# Patient Record
Sex: Female | Born: 1960 | Race: White | Hispanic: No | Marital: Married | Smoking: Never smoker
Health system: Southern US, Community
[De-identification: ages and names within clinical notes are randomized; demographics above are authoritative.]

## PROBLEM LIST (undated history)

## (undated) DIAGNOSIS — T4145XA Adverse effect of unspecified anesthetic, initial encounter: Secondary | ICD-10-CM

## (undated) DIAGNOSIS — G43909 Migraine, unspecified, not intractable, without status migrainosus: Secondary | ICD-10-CM

## (undated) DIAGNOSIS — R112 Nausea with vomiting, unspecified: Secondary | ICD-10-CM

## (undated) DIAGNOSIS — U071 COVID-19: Secondary | ICD-10-CM

## (undated) DIAGNOSIS — W19XXXA Unspecified fall, initial encounter: Secondary | ICD-10-CM

## (undated) DIAGNOSIS — K589 Irritable bowel syndrome without diarrhea: Secondary | ICD-10-CM

## (undated) DIAGNOSIS — T7840XA Allergy, unspecified, initial encounter: Secondary | ICD-10-CM

## (undated) DIAGNOSIS — F909 Attention-deficit hyperactivity disorder, unspecified type: Secondary | ICD-10-CM

## (undated) DIAGNOSIS — M8580 Other specified disorders of bone density and structure, unspecified site: Secondary | ICD-10-CM

## (undated) DIAGNOSIS — C4491 Basal cell carcinoma of skin, unspecified: Secondary | ICD-10-CM

## (undated) DIAGNOSIS — M1990 Unspecified osteoarthritis, unspecified site: Secondary | ICD-10-CM

## (undated) DIAGNOSIS — M67911 Unspecified disorder of synovium and tendon, right shoulder: Secondary | ICD-10-CM

## (undated) DIAGNOSIS — B019 Varicella without complication: Secondary | ICD-10-CM

## (undated) DIAGNOSIS — M109 Gout, unspecified: Secondary | ICD-10-CM

## (undated) DIAGNOSIS — R58 Hemorrhage, not elsewhere classified: Secondary | ICD-10-CM

## (undated) DIAGNOSIS — T8859XA Other complications of anesthesia, initial encounter: Secondary | ICD-10-CM

## (undated) DIAGNOSIS — I10 Essential (primary) hypertension: Secondary | ICD-10-CM

## (undated) DIAGNOSIS — K760 Fatty (change of) liver, not elsewhere classified: Secondary | ICD-10-CM

## (undated) DIAGNOSIS — G629 Polyneuropathy, unspecified: Secondary | ICD-10-CM

## (undated) DIAGNOSIS — Z9889 Other specified postprocedural states: Secondary | ICD-10-CM

## (undated) DIAGNOSIS — F32A Depression, unspecified: Secondary | ICD-10-CM

## (undated) DIAGNOSIS — K219 Gastro-esophageal reflux disease without esophagitis: Secondary | ICD-10-CM

## (undated) DIAGNOSIS — E119 Type 2 diabetes mellitus without complications: Secondary | ICD-10-CM

## (undated) DIAGNOSIS — G4700 Insomnia, unspecified: Secondary | ICD-10-CM

## (undated) DIAGNOSIS — E785 Hyperlipidemia, unspecified: Secondary | ICD-10-CM

## (undated) DIAGNOSIS — E669 Obesity, unspecified: Secondary | ICD-10-CM

## (undated) DIAGNOSIS — F329 Major depressive disorder, single episode, unspecified: Secondary | ICD-10-CM

## (undated) DIAGNOSIS — Z8619 Personal history of other infectious and parasitic diseases: Secondary | ICD-10-CM

## (undated) DIAGNOSIS — K529 Noninfective gastroenteritis and colitis, unspecified: Secondary | ICD-10-CM

## (undated) HISTORY — DX: Fatty (change of) liver, not elsewhere classified: K76.0

## (undated) HISTORY — DX: Gout, unspecified: M10.9

## (undated) HISTORY — PX: CHOLECYSTECTOMY: SHX55

## (undated) HISTORY — DX: Unspecified fall, initial encounter: W19.XXXA

## (undated) HISTORY — DX: Major depressive disorder, single episode, unspecified: F32.9

## (undated) HISTORY — PX: APPENDECTOMY: SHX54

## (undated) HISTORY — PX: MOHS SURGERY: SUR867

## (undated) HISTORY — DX: Insomnia, unspecified: G47.00

## (undated) HISTORY — DX: Type 2 diabetes mellitus without complications: E11.9

## (undated) HISTORY — DX: Gastro-esophageal reflux disease without esophagitis: K21.9

## (undated) HISTORY — DX: Personal history of other infectious and parasitic diseases: Z86.19

## (undated) HISTORY — DX: Varicella without complication: B01.9

## (undated) HISTORY — PX: OTHER SURGICAL HISTORY: SHX169

## (undated) HISTORY — DX: Allergy, unspecified, initial encounter: T78.40XA

## (undated) HISTORY — PX: FOOT SURGERY: SHX648

## (undated) HISTORY — DX: COVID-19: U07.1

## (undated) HISTORY — DX: Migraine, unspecified, not intractable, without status migrainosus: G43.909

## (undated) HISTORY — DX: Essential (primary) hypertension: I10

## (undated) HISTORY — DX: Unspecified disorder of synovium and tendon, right shoulder: M67.911

## (undated) HISTORY — DX: Obesity, unspecified: E66.9

## (undated) HISTORY — PX: TUBAL LIGATION: SHX77

## (undated) HISTORY — PX: ROTATOR CUFF REPAIR: SHX139

## (undated) HISTORY — PX: ABDOMINAL HYSTERECTOMY: SHX81

## (undated) HISTORY — DX: Unspecified osteoarthritis, unspecified site: M19.90

## (undated) HISTORY — DX: Noninfective gastroenteritis and colitis, unspecified: K52.9

## (undated) HISTORY — DX: Basal cell carcinoma of skin, unspecified: C44.91

## (undated) HISTORY — DX: Other specified disorders of bone density and structure, unspecified site: M85.80

## (undated) HISTORY — DX: Depression, unspecified: F32.A

---

## 1980-01-21 HISTORY — PX: OTHER SURGICAL HISTORY: SHX169

## 2004-11-06 ENCOUNTER — Ambulatory Visit: Payer: Self-pay | Admitting: Obstetrics and Gynecology

## 2004-11-14 ENCOUNTER — Ambulatory Visit: Payer: Self-pay | Admitting: Obstetrics and Gynecology

## 2004-12-09 ENCOUNTER — Inpatient Hospital Stay: Payer: Self-pay | Admitting: Obstetrics and Gynecology

## 2005-05-19 ENCOUNTER — Ambulatory Visit: Payer: Self-pay | Admitting: Obstetrics and Gynecology

## 2005-12-08 ENCOUNTER — Ambulatory Visit: Payer: Self-pay | Admitting: Obstetrics and Gynecology

## 2006-11-26 ENCOUNTER — Ambulatory Visit: Payer: Self-pay | Admitting: Family Medicine

## 2006-12-14 ENCOUNTER — Ambulatory Visit: Payer: Self-pay | Admitting: Obstetrics and Gynecology

## 2006-12-16 ENCOUNTER — Ambulatory Visit: Payer: Self-pay | Admitting: Obstetrics and Gynecology

## 2009-01-29 ENCOUNTER — Ambulatory Visit: Payer: Self-pay | Admitting: Unknown Physician Specialty

## 2009-03-19 ENCOUNTER — Ambulatory Visit: Payer: Self-pay | Admitting: Unknown Physician Specialty

## 2009-06-07 ENCOUNTER — Ambulatory Visit: Payer: Self-pay | Admitting: Obstetrics and Gynecology

## 2010-02-14 ENCOUNTER — Ambulatory Visit: Payer: Self-pay | Admitting: Neurology

## 2010-07-02 ENCOUNTER — Ambulatory Visit: Payer: Self-pay | Admitting: Obstetrics and Gynecology

## 2011-01-10 ENCOUNTER — Ambulatory Visit: Payer: Self-pay | Admitting: Internal Medicine

## 2011-03-05 ENCOUNTER — Ambulatory Visit: Payer: Self-pay | Admitting: Gastroenterology

## 2011-03-10 ENCOUNTER — Ambulatory Visit: Payer: Self-pay | Admitting: Surgery

## 2011-07-15 ENCOUNTER — Ambulatory Visit: Payer: Self-pay | Admitting: Gastroenterology

## 2011-07-21 ENCOUNTER — Ambulatory Visit: Payer: Self-pay | Admitting: Obstetrics and Gynecology

## 2012-07-26 ENCOUNTER — Ambulatory Visit: Payer: Self-pay | Admitting: Obstetrics and Gynecology

## 2012-08-30 DIAGNOSIS — C44319 Basal cell carcinoma of skin of other parts of face: Secondary | ICD-10-CM | POA: Insufficient documentation

## 2012-10-24 ENCOUNTER — Ambulatory Visit: Payer: Self-pay | Admitting: Internal Medicine

## 2012-10-24 LAB — URINALYSIS, COMPLETE
Glucose,UR: NEGATIVE mg/dL (ref 0–75)
Ketone: NEGATIVE
Leukocyte Esterase: NEGATIVE
Ph: 6.5 (ref 4.5–8.0)
Protein: NEGATIVE
Specific Gravity: 1.005 (ref 1.003–1.030)

## 2012-10-24 LAB — CBC WITH DIFFERENTIAL/PLATELET
Basophil #: 0 10*3/uL (ref 0.0–0.1)
Eosinophil %: 1.6 %
HCT: 43 % (ref 35.0–47.0)
HGB: 14.1 g/dL (ref 12.0–16.0)
Lymphocyte #: 2.1 10*3/uL (ref 1.0–3.6)
MCH: 29.4 pg (ref 26.0–34.0)
Monocyte #: 0.6 x10 3/mm (ref 0.2–0.9)
Monocyte %: 7.2 %
Neutrophil %: 66.9 %
Platelet: 305 10*3/uL (ref 150–440)
RDW: 13.4 % (ref 11.5–14.5)

## 2012-10-24 LAB — COMPREHENSIVE METABOLIC PANEL
BUN: 12 mg/dL (ref 7–18)
Bilirubin,Total: 0.2 mg/dL (ref 0.2–1.0)
Calcium, Total: 9 mg/dL (ref 8.5–10.1)
Co2: 29 mmol/L (ref 21–32)
Creatinine: 0.67 mg/dL (ref 0.60–1.30)
EGFR (Non-African Amer.): >60
Glucose: 103 mg/dL — ABNORMAL HIGH (ref 65–99)
Osmolality: 278 (ref 275–301)
SGPT (ALT): 22 U/L (ref 12–78)
Sodium: 139 mmol/L (ref 136–145)

## 2012-10-24 LAB — SEDIMENTATION RATE: Erythrocyte Sed Rate: 16 mm/hr (ref 0–30)

## 2013-01-07 ENCOUNTER — Ambulatory Visit: Payer: Self-pay | Admitting: Family Medicine

## 2013-01-09 ENCOUNTER — Ambulatory Visit: Payer: Self-pay | Admitting: Physician Assistant

## 2013-05-13 ENCOUNTER — Ambulatory Visit: Payer: Self-pay | Admitting: Family Medicine

## 2013-05-23 ENCOUNTER — Ambulatory Visit: Payer: Self-pay | Admitting: Orthopedic Surgery

## 2013-08-18 ENCOUNTER — Ambulatory Visit: Payer: Self-pay | Admitting: Physician Assistant

## 2013-08-18 LAB — RAPID STREP-A WITH REFLX: Micro Text Report: NEGATIVE

## 2013-08-21 LAB — BETA STREP CULTURE(ARMC)

## 2013-10-10 ENCOUNTER — Ambulatory Visit: Payer: Self-pay | Admitting: Obstetrics and Gynecology

## 2013-10-12 ENCOUNTER — Ambulatory Visit: Payer: Self-pay | Admitting: Obstetrics and Gynecology

## 2014-01-09 ENCOUNTER — Ambulatory Visit: Payer: Self-pay

## 2014-01-09 LAB — RAPID STREP-A WITH REFLX: Micro Text Report: NEGATIVE

## 2014-01-11 LAB — BETA STREP CULTURE(ARMC)

## 2014-01-17 ENCOUNTER — Ambulatory Visit: Payer: Self-pay | Admitting: Physician Assistant

## 2014-01-17 LAB — CBC WITH DIFFERENTIAL/PLATELET
BASOS ABS: 0.1 10*3/uL (ref 0.0–0.1)
BASOS PCT: 0.8 %
Eosinophil #: 0.2 10*3/uL (ref 0.0–0.7)
Eosinophil %: 1.6 %
HCT: 40.8 % (ref 35.0–47.0)
HGB: 13.3 g/dL (ref 12.0–16.0)
LYMPHS ABS: 3.2 10*3/uL (ref 1.0–3.6)
Lymphocyte %: 27.7 %
MCH: 28.6 pg (ref 26.0–34.0)
MCHC: 32.5 g/dL (ref 32.0–36.0)
MCV: 88 fL (ref 80–100)
MONO ABS: 0.6 x10 3/mm (ref 0.2–0.9)
MONOS PCT: 5.3 %
Neutrophil #: 7.4 10*3/uL — ABNORMAL HIGH (ref 1.4–6.5)
Neutrophil %: 64.6 %
Platelet: 324 10*3/uL (ref 150–440)
RBC: 4.64 10*6/uL (ref 3.80–5.20)
RDW: 13.7 % (ref 11.5–14.5)
WBC: 11.4 10*3/uL — AB (ref 3.6–11.0)

## 2014-01-17 LAB — MONONUCLEOSIS SCREEN: Mono Test: NEGATIVE

## 2014-05-14 NOTE — Op Note (Signed)
PATIENT NAME:  Kristin, [name] MR#:  [id number] DATE OF BIRTH:  August 16, 1960  DATE OF PROCEDURE:  03/10/2011  PREOPERATIVE DIAGNOSIS: Chronic acalculous cholecystitis.   POSTOPERATIVE DIAGNOSIS: Chronic acalculous cholecystitis.   PROCEDURE: Laparoscopic cholecystectomy, cholangiogram.   SURGEON: Loreli Dollar, MD  ANESTHESIA: General.   INDICATIONS: This 54 year old female reports a history of right upper quadrant pains over some two years duration. Recently has increased in severity and frequency. She had an abnormally low gallbladder ejection fraction of 15% and surgery was recommended for definitive treatment.   DESCRIPTION OF PROCEDURE: The patient was placed on the operating table in the supine position under general endotracheal anesthesia. The abdomen was prepared with ChloraPrep, draped in a sterile manner.   A short incision was made in the inferior aspect of the umbilicus and carried down to the deep fascia which was grasped with laryngeal hook and elevated. A Veress needle was inserted, aspirated, and irrigated with a saline solution. Next, the peritoneal cavity was inflated with carbon dioxide. The Veress needle was removed. The 10 mm cannula was inserted. The 10 mm 0 degrees laparoscope was inserted to view the peritoneal cavity. The patient was placed in the reverse Trendelenburg position and turned some 5 degrees to the left. Gallbladder appeared to have a slightly thickened wall and was retracted towards the right shoulder. The infundibulum was retracted inferiorly and laterally. The porta hepatis was demonstrated. The cystic duct was dissected free from surrounding structures. The cystic artery was dissected free from surrounding structures. The neck of the gallbladder was mobilized with incision of the visceral peritoneum. A critical view of safety was demonstrated. An endoclip was placed across the cystic duct adjacent to the neck of the gallbladder. An incision was made in  the cystic duct to introduce a Reddick catheter. Half-strength Conray-60 dye was injected as the cholangiogram was done with fluoroscopy demonstrating the biliary tree and prompt flow of dye into the duodenum. No retained stones were seen. The cholangiogram appeared normal. There was some mild obscurity related to the epigastric port. The cholangiocatheter was removed and the cystic duct was doubly ligated with endoclips and divided. The cystic artery was controlled with double endoclips and divided. The gallbladder was dissected free from the liver with hook and cautery. A small amount of blood was aspirated. Hemostasis was subsequently intact. The gallbladder was delivered up through the infraumbilical incision, opened and suctioned, removed and submitted for routine pathology. The right upper quadrant was further inspected. Hemostasis was intact. The cannulas were removed. Carbon dioxide was allowed to escape from the peritoneal cavity. Skin incisions were closed with interrupted 5-0 chromic subcuticular sutures, benzoin, and Steri-Strips. Dressings applied with paper tape. The patient tolerated surgery satisfactorily and was moved to the recovery room for postoperative care.  ____________________________ J. Rochel Brome, MD jws:cms D: 03/10/2011 12:38:46 ET T: 03/10/2011 13:05:30 ET JOB#: [id number]  cc: Loreli Dollar, MD, <Dictator> Loreli Dollar MD ELECTRONICALLY SIGNED 03/11/2011 9:43

## 2014-05-16 ENCOUNTER — Ambulatory Visit
Admit: 2014-05-16 | Disposition: A | Discharge status: Home / Self Care | Payer: Self-pay | Attending: Obstetrics and Gynecology | Admitting: Obstetrics and Gynecology

## 2014-06-04 ENCOUNTER — Ambulatory Visit
Admission: EM | Admit: 2014-06-04 | Discharge: 2014-06-04 | Disposition: A | Discharge status: Home / Self Care | Payer: Managed Care, Other (non HMO) | Attending: Family Medicine | Admitting: Family Medicine

## 2014-06-04 ENCOUNTER — Encounter: Payer: Self-pay | Admitting: Emergency Medicine

## 2014-06-04 DIAGNOSIS — R21 Rash and other nonspecific skin eruption: Secondary | ICD-10-CM

## 2014-06-04 HISTORY — DX: Hyperlipidemia, unspecified: E78.5

## 2014-06-04 MED ORDER — HYDROXYZINE HCL 25 MG PO TABS: 25.0000 mg | ORAL_TABLET | ORAL | Status: DC | PRN

## 2014-06-04 MED ORDER — PREDNISONE 10 MG PO TABS: ORAL_TABLET | ORAL | Status: DC

## 2014-06-04 NOTE — Discharge Instructions (Signed)
In addition to the prescriptions, you may also may take a daily stomach acid pill such as Zantac or Pepcid and a daily allergy medicine such as Zyrtec or Allegra, that will help with the itching as well.   Contact Dermatitis Contact dermatitis is a reaction to certain substances that touch the skin. Contact dermatitis can be either irritant contact dermatitis or allergic contact dermatitis. Irritant contact dermatitis does not require previous exposure to the substance for a reaction to occur.Allergic contact dermatitis only occurs if you have been exposed to the substance before. Upon a repeat exposure, your body reacts to the substance.  CAUSES  Many substances can cause contact dermatitis. Irritant dermatitis is most commonly caused by repeated exposure to mildly irritating substances, such as:  Makeup.  Soaps.  Detergents.  Bleaches.  Acids.  Metal salts, such as nickel. Allergic contact dermatitis is most commonly caused by exposure to:  Poisonous plants.  Chemicals (deodorants, shampoos).  Jewelry.  Latex.  Neomycin in triple antibiotic cream.  Preservatives in products, including clothing. SYMPTOMS  The area of skin that is exposed may develop:  Dryness or flaking.  Redness.  Cracks.  Itching.  Pain or a burning sensation.  Blisters. With allergic contact dermatitis, there may also be swelling in areas such as the eyelids, mouth, or genitals.  DIAGNOSIS  Your caregiver can usually tell what the problem is by doing a physical exam. In cases where the cause is uncertain and an allergic contact dermatitis is suspected, a patch skin test may be performed to help determine the cause of your dermatitis. TREATMENT Treatment includes protecting the skin from further contact with the irritating substance by avoiding that substance if possible. Barrier creams, powders, and gloves may be helpful. Your caregiver may also recommend:  Steroid creams or ointments applied  2 times daily. For best results, soak the rash area in cool water for 20 minutes. Then apply the medicine. Cover the area with a plastic wrap. You can store the steroid cream in the refrigerator for a "chilly" effect on your rash. That may decrease itching. Oral steroid medicines may be needed in more severe cases.  Antibiotics or antibacterial ointments if a skin infection is present.  Antihistamine lotion or an antihistamine taken by mouth to ease itching.  Lubricants to keep moisture in your skin.  Burow's solution to reduce redness and soreness or to dry a weeping rash. Mix one packet or tablet of solution in 2 cups cool water. Dip a clean washcloth in the mixture, wring it out a bit, and put it on the affected area. Leave the cloth in place for 30 minutes. Do this as often as possible throughout the day.  Taking several cornstarch or baking soda baths daily if the area is too large to cover with a washcloth. Harsh chemicals, such as alkalis or acids, can cause skin damage that is like a burn. You should flush your skin for 15 to 20 minutes with cold water after such an exposure. You should also seek immediate medical care after exposure. Bandages (dressings), antibiotics, and pain medicine may be needed for severely irritated skin.  HOME CARE INSTRUCTIONS  Avoid the substance that caused your reaction.  Keep the area of skin that is affected away from hot water, soap, sunlight, chemicals, acidic substances, or anything else that would irritate your skin.  Do not scratch the rash. Scratching may cause the rash to become infected.  You may take cool baths to help stop the itching.  Only  take over-the-counter or prescription medicines as directed by your caregiver.  See your caregiver for follow-up care as directed to make sure your skin is healing properly. SEEK MEDICAL CARE IF:   Your condition is not better after 3 days of treatment.  You seem to be getting worse.  You see signs  of infection such as swelling, tenderness, redness, soreness, or warmth in the affected area.  You have any problems related to your medicines. Document Released: 01/04/2000 Document Revised: 03/31/2011 Document Reviewed: 06/11/2010 [facility] Patient Information 2015 [place], [place]. This information is not intended to replace advice given to you by your health care provider. Make sure you discuss any questions you have with your health care provider.  Allergies Allergies may happen from anything your body is sensitive to. This may be food, medicines, pollens, chemicals, and nearly anything around you in everyday life that produces allergens. An allergen is anything that causes an allergy producing substance. Heredity is often a factor in causing these problems. This means you may have some of the same allergies as your parents. Food allergies happen in all age groups. Food allergies are some of the most severe and life threatening. Some common food allergies are cow's milk, seafood, eggs, nuts, wheat, and soybeans. SYMPTOMS   Swelling around the mouth.  An itchy red rash or hives.  Vomiting or diarrhea.  Difficulty breathing. SEVERE ALLERGIC REACTIONS ARE LIFE-THREATENING. This reaction is called anaphylaxis. It can cause the mouth and throat to swell and cause difficulty with breathing and swallowing. In severe reactions only a trace amount of food (for example, peanut oil in a salad) may cause death within seconds. Seasonal allergies occur in all age groups. These are seasonal because they usually occur during the same season every year. They may be a reaction to molds, grass pollens, or tree pollens. Other causes of problems are house dust mite allergens, pet dander, and mold spores. The symptoms often consist of nasal congestion, a runny itchy nose associated with sneezing, and tearing itchy eyes. There is often an associated itching of the mouth and ears. The problems happen when you come in  contact with pollens and other allergens. Allergens are the particles in the air that the body reacts to with an allergic reaction. This causes you to release allergic antibodies. Through a chain of events, these eventually cause you to release histamine into the blood stream. Although it is meant to be protective to the body, it is this release that causes your discomfort. This is why you were given anti-histamines to feel better. If you are unable to pinpoint the offending allergen, it may be determined by skin or blood testing. Allergies cannot be cured but can be controlled with medicine. Hay fever is a collection of all or some of the seasonal allergy problems. It may often be treated with simple over-the-counter medicine such as diphenhydramine. Take medicine as directed. Do not drink alcohol or drive while taking this medicine. Check with your caregiver or package insert for child dosages. If these medicines are not effective, there are many new medicines your caregiver can prescribe. Stronger medicine such as nasal spray, eye drops, and corticosteroids may be used if the first things you try do not work well. Other treatments such as immunotherapy or desensitizing injections can be used if all else fails. Follow up with your caregiver if problems continue. These seasonal allergies are usually not life threatening. They are generally more of a nuisance that can often be handled using medicine.  HOME CARE INSTRUCTIONS   If unsure what causes a reaction, keep a diary of foods eaten and symptoms that follow. Avoid foods that cause reactions.  If hives or rash are present:  Take medicine as directed.  You may use an over-the-counter antihistamine (diphenhydramine) for hives and itching as needed.  Apply cold compresses (cloths) to the skin or take baths in cool water. Avoid hot baths or showers. Heat will make a rash and itching worse.  If you are severely allergic:  Following a treatment for a  severe reaction, hospitalization is often required for closer follow-up.  Wear a medic-alert bracelet or necklace stating the allergy.  You and your family must learn how to give adrenaline or use an anaphylaxis kit.  If you have had a severe reaction, always carry your anaphylaxis kit or EpiPen with you. Use this medicine as directed by your caregiver if a severe reaction is occurring. Failure to do so could have a fatal outcome. SEEK MEDICAL CARE IF:  You suspect a food allergy. Symptoms generally happen within 30 minutes of eating a food.  Your symptoms have not gone away within 2 days or are getting worse.  You develop new symptoms.  You want to retest yourself or your child with a food or drink you think causes an allergic reaction. Never do this if an anaphylactic reaction to that food or drink has happened before. Only do this under the care of a caregiver. SEEK IMMEDIATE MEDICAL CARE IF:   You have difficulty breathing, are wheezing, or have a tight feeling in your chest or throat.  You have a swollen mouth, or you have hives, swelling, or itching all over your body.  You have had a severe reaction that has responded to your anaphylaxis kit or an EpiPen. These reactions may return when the medicine has worn off. These reactions should be considered life threatening. MAKE SURE YOU:   Understand these instructions.  Will watch your condition.  Will get help right away if you are not doing well or get worse. Document Released: 04/01/2002 Document Revised: 05/03/2012 Document Reviewed: 09/06/2007 [facility] Patient Information 2015 [place], [place]. This information is not intended to replace advice given to you by your health care provider. Make sure you discuss any questions you have with your health care provider.  Rash A rash is a change in the color or texture of your skin. There are many different types of rashes. You may have other problems that accompany your  rash. CAUSES   Infections.  Allergic reactions. This can include allergies to pets or foods.  Certain medicines.  Exposure to certain chemicals, soaps, or cosmetics.  Heat.  Exposure to poisonous plants.  Tumors, both cancerous and noncancerous. SYMPTOMS   Redness.  Scaly skin.  Itchy skin.  Dry or cracked skin.  Bumps.  Blisters.  Pain. DIAGNOSIS  Your caregiver may do a physical exam to determine what type of rash you have. A skin sample (biopsy) may be taken and examined under a microscope. TREATMENT  Treatment depends on the type of rash you have. Your caregiver may prescribe certain medicines. For serious conditions, you may need to see a skin doctor (dermatologist). HOME CARE INSTRUCTIONS   Avoid the substance that caused your rash.  Do not scratch your rash. This can cause infection.  You may take cool baths to help stop itching.  Only take over-the-counter or prescription medicines as directed by your caregiver.  Keep all follow-up appointments as directed by your caregiver.  SEEK IMMEDIATE MEDICAL CARE IF:  You have increasing pain, swelling, or redness.  You have a fever.  You have new or severe symptoms.  You have body aches, diarrhea, or vomiting.  Your rash is not better after 3 days. MAKE SURE YOU:  Understand these instructions.  Will watch your condition.  Will get help right away if you are not doing well or get worse. Document Released: 12/27/2001 Document Revised: 03/31/2011 Document Reviewed: 10/21/2010 [facility] Patient Information 2015 [place], [place]. This information is not intended to replace advice given to you by your health care provider. Make sure you discuss any questions you have with your health care provider.

## 2014-06-04 NOTE — ED Provider Notes (Signed)
CSN: [id number]     Arrival date & time 06/04/14  0932 History   First MD Initiated Contact with Patient 06/04/14 1057     Chief Complaint  Patient presents with  . Rash  . Pruritis   (Consider location/radiation/quality/duration/timing/severity/associated sxs/prior Treatment) HPI        54 year old female presents complaining of an itching rash. This started on her neck and chest yesterday and has since spread to her back and off for several days. It is red and extremely itchy. She denies any known exposure to allergens. She has never had a rash like this that she can recall. She denies any current or recent illness. She denies sore throat, cough, congestion, rhinorrhea, or any other cold symptoms. No one else in her home or work has a similar rash that she knows of.  Past Medical History  Diagnosis Date  . Hyperlipidemia    Past Surgical History  Procedure Laterality Date  . Cesarean section N/A     2  . Cholecystectomy    . Abdominal hysterectomy    . Rotator cuff repair      lt shoulder   No family history on file. History  Substance Use Topics  . Smoking status: Never Smoker   . Smokeless tobacco: Not on file  . Alcohol Use: No   OB History    No data available     Review of Systems  Skin: Positive for rash.  All other systems reviewed and are negative.   Allergies  Tetracyclines & related  Home Medications   Prior to Admission medications   Medication Sig Start Date End Date Taking? Authorizing Provider  diphenhydrAMINE (SOMINEX) 25 MG tablet Take 50 mg by mouth at bedtime as needed for itching.   Yes Historical Provider, MD  estrogens, conjugated, (PREMARIN) 1.25 MG tablet Take 1.25 mg by mouth daily.   Yes Historical Provider, MD  simvastatin (ZOCOR) 10 MG tablet Take 10 mg by mouth daily.   Yes Historical Provider, MD  traZODone (DESYREL) 100 MG tablet Take 100 mg by mouth at bedtime.   Yes Historical Provider, MD  hydrOXYzine (ATARAX/VISTARIL) 25 MG tablet  Take 1 tablet (25 mg total) by mouth every 4 (four) hours as needed. 06/04/14   Liam Graham, PA-C  predniSONE (DELTASONE) 10 MG tablet 4 tabs PO QD for 4 days; 3 tabs PO QD for 3 days; 2 tabs PO QD for 2 days; 1 tab PO QD for 1 day 06/04/14   Liam Graham, PA-C   BP 151/73 mmHg  Pulse 72  Temp(Src) 97.1 F (36.2 C) (Oral)  Resp 18  Ht 5\' 3"  (1.6 m)  Wt 191 lb (86.637 kg)  BMI 33.84 kg/m2  SpO2 99% Physical Exam  Constitutional: She is oriented to person, place, and time. Vital signs are normal. She appears well-developed and well-nourished. No distress.  HENT:  Head: Normocephalic and atraumatic.  Right Ear: External ear normal.  Left Ear: External ear normal.  Nose: Nose normal.  Mouth/Throat: Oropharynx is clear and moist. No oropharyngeal exudate.  Eyes: Conjunctivae are normal.  Neck: Normal range of motion. Neck supple.  Cardiovascular: Normal rate, regular rhythm and normal heart sounds.   Pulmonary/Chest: Effort normal and breath sounds normal. No respiratory distress.  Neurological: She is alert and oriented to person, place, and time. She has normal strength. Coordination normal.  Skin: Skin is warm and dry. Rash noted. Rash is maculopapular (erythematous maculopapular rash on the trunk and extremities, nontender). She is  not diaphoretic.  Psychiatric: She has a normal mood and affect. Judgment normal.  Nursing note and vitals reviewed.   ED Course  Procedures (including critical care time) Labs Review Labs Reviewed - No data to display  Imaging Review No results found.   MDM   1. Rash and nonspecific skin eruption    Unclear cause, will treat with prednisone and antihistamines. Follow-up if no improvement in a couple days  New Prescriptions   HYDROXYZINE (ATARAX/VISTARIL) 25 MG TABLET    Take 1 tablet (25 mg total) by mouth every 4 (four) hours as needed.   PREDNISONE (DELTASONE) 10 MG TABLET    4 tabs PO QD for 4 days; 3 tabs PO QD for 3 days; 2 tabs PO  QD for 2 days; 1 tab PO QD for 1 day       Liam Graham, PA-C 06/04/14 1115

## 2014-06-04 NOTE — ED Notes (Signed)
Pt present s with c/o rash and itching since yesterday, states that she started with a rash on the neck and lt arm and it started itching since yesterday, tried the OTC benadryl with no relief, denies any food allergies or working outside or any contact with plants

## 2014-06-21 ENCOUNTER — Ambulatory Visit
Admission: EM | Admit: 2014-06-21 | Discharge: 2014-06-21 | Disposition: A | Discharge status: Home / Self Care | Payer: Managed Care, Other (non HMO) | Attending: Family Medicine | Admitting: Family Medicine

## 2014-06-21 DIAGNOSIS — J029 Acute pharyngitis, unspecified: Secondary | ICD-10-CM

## 2014-06-21 NOTE — ED Notes (Signed)
Patient states she has had a sore throat for several weeks.

## 2014-06-21 NOTE — ED Provider Notes (Signed)
CSN: [id number]     Arrival date & time 06/21/14  1501 History   First MD Initiated Contact with Patient 06/21/14 1534     Chief Complaint  Patient presents with  . Sore Throat   (Consider location/radiation/quality/duration/timing/severity/associated sxs/prior Treatment) HPI   54 year old female who presents with a sore throat of 3 weeks duration. She states that is seen to be getting worse and not better. Denies any fever or chills. Her husband states that she has a raspy voice and her coworkers complain that she clears her throat frequently. She denies any cough or any sinus problems. She's had no recent airplane flights or been up in the mountains. She states that it hurts to swallow or singing but she feels certain that it is not a strep throat. She has never smoked.  Past Medical History  Diagnosis Date  . Hyperlipidemia    Past Surgical History  Procedure Laterality Date  . Cesarean section N/A     2  . Cholecystectomy    . Abdominal hysterectomy    . Rotator cuff repair      lt shoulder   No family history on file. History  Substance Use Topics  . Smoking status: Never Smoker   . Smokeless tobacco: Not on file  . Alcohol Use: No   OB History    No data available     Review of Systems  HENT: Positive for sore throat, trouble swallowing and voice change. Negative for postnasal drip, rhinorrhea, sinus pressure, sneezing and tinnitus.   Musculoskeletal: Negative for myalgias and arthralgias.  All other systems reviewed and are negative.   Allergies  Tetracyclines & related  Home Medications   Prior to Admission medications   Medication Sig Start Date End Date Taking? Authorizing Provider  estrogens, conjugated, (PREMARIN) 1.25 MG tablet Take 1.25 mg by mouth daily.   Yes Historical Provider, MD  simvastatin (ZOCOR) 10 MG tablet Take 10 mg by mouth daily.   Yes Historical Provider, MD  traZODone (DESYREL) 100 MG tablet Take 100 mg by mouth at bedtime.   Yes  Historical Provider, MD  diphenhydrAMINE (SOMINEX) 25 MG tablet Take 50 mg by mouth at bedtime as needed for itching.    Historical Provider, MD  hydrOXYzine (ATARAX/VISTARIL) 25 MG tablet Take 1 tablet (25 mg total) by mouth every 4 (four) hours as needed. 06/04/14   Liam Graham, PA-C  predniSONE (DELTASONE) 10 MG tablet 4 tabs PO QD for 4 days; 3 tabs PO QD for 3 days; 2 tabs PO QD for 2 days; 1 tab PO QD for 1 day 06/04/14   Liam Graham, PA-C   BP 129/65 mmHg  Pulse 77  Temp(Src) 98 F (36.7 C) (Tympanic)  Resp 16  Ht 5\' 3"  (1.6 m)  Wt 188 lb (85.276 kg)  BMI 33.31 kg/m2  SpO2 97% Physical Exam  Constitutional: She is oriented to person, place, and time. She appears well-developed and well-nourished.  HENT:  Head: Normocephalic and atraumatic.  Right Ear: External ear normal.  Left Ear: External ear normal.  Nose: Nose normal.  Mouth/Throat: Oropharynx is clear and moist.  Eyes: EOM are normal. Pupils are equal, round, and reactive to light. Right eye exhibits no discharge. Left eye exhibits no discharge.  Neck: Normal range of motion. No thyromegaly present.  Cardiovascular: Normal rate, regular rhythm and normal heart sounds.  Exam reveals no gallop and no friction rub.   No murmur heard. Pulmonary/Chest: Effort normal and breath sounds normal. No  respiratory distress. She has no wheezes. She has no rales. She exhibits no tenderness.  Musculoskeletal: Normal range of motion.  Lymphadenopathy:    She has no cervical adenopathy.  Neurological: She is alert and oriented to person, place, and time. She has normal reflexes.  Skin: Skin is warm and dry.  Psychiatric: She has a normal mood and affect. Judgment and thought content normal.  Nursing note and vitals reviewed.   ED Course  Procedures (including critical care time) Labs Review Labs Reviewed - No data to display  Imaging Review No results found.   MDM   1. Sore throat    I spoken to the patient regarding  her findings and her symptoms. I have not found a significant findings to indicate a bacterial infection in her throat or viral reason for her throat pain. However with her complaints of a raspy voice as well as clearing her throat frequently and the length of time that she has been experiencing a sore throat without any fever or chills I recommend that she be seen by an ear nose and throat specialist and have recommended Dr. Ladene Artist for a more thorough workup and possible video lyringoscopy. In the meantime I have instructed her in the use of nasal steroids until she ranges an appointment with Dr. Ladene Artist.Lorin Picket, PA-C 06/21/14 [phone]

## 2014-06-22 ENCOUNTER — Encounter: Payer: Self-pay | Admitting: Emergency Medicine

## 2014-07-05 ENCOUNTER — Encounter: Payer: Self-pay | Admitting: Obstetrics and Gynecology

## 2014-07-05 ENCOUNTER — Ambulatory Visit (INDEPENDENT_AMBULATORY_CARE_PROVIDER_SITE_OTHER): Payer: Managed Care, Other (non HMO) | Admitting: Obstetrics and Gynecology

## 2014-07-05 VITALS — BP 164/90 | HR 76 | Ht 63.0 in | Wt 194.1 lb

## 2014-07-05 DIAGNOSIS — R638 Other symptoms and signs concerning food and fluid intake: Secondary | ICD-10-CM

## 2014-07-05 DIAGNOSIS — Z780 Asymptomatic menopausal state: Secondary | ICD-10-CM | POA: Diagnosis not present

## 2014-07-05 DIAGNOSIS — T50905A Adverse effect of unspecified drugs, medicaments and biological substances, initial encounter: Secondary | ICD-10-CM | POA: Insufficient documentation

## 2014-07-05 DIAGNOSIS — R030 Elevated blood-pressure reading, without diagnosis of hypertension: Secondary | ICD-10-CM | POA: Diagnosis not present

## 2014-07-05 DIAGNOSIS — I158 Other secondary hypertension: Secondary | ICD-10-CM | POA: Insufficient documentation

## 2014-07-05 NOTE — Progress Notes (Signed)
Patient ID: Kristin [name], female   DOB: [birth year], 54 y.o.   MRN: [id number]    GYN ENCOUNTER NOTE  Subjective:       Kristin [name] is a 54 y.o. No obstetric history on file. female is here for gynecologic evaluation of the following issues:  1. Weight gain.  RN note:  Pt presents for wt gain. 14# wt gain since last year. Pos exercise and diet control. Can't lose the weight.  Kristin [name] has been off again on again with phentermine/diet/exercise management for weight control.  She has waxed and waned with her weight loss program.  Most recently over the past year she has gained 14 pounds and is interested in proceeding with a different approach.  Clearly the appetite suppressant medication along with diet and exercise have not been successful.  She is exercising routinely, getting 10-15,000 steps per day.  She reports eating healthy, 3 meals per day.  Review of her blood pressure trends shows an increase in her blood pressure which may be associated with her weight gain this past year.  She understands that this issue needs to be addressed and she is referred to her primary care physician for evaluation and management of hypertension.  She will also be referred to the [facility] health bariatric clinic for possible alternative interventions for weight loss.    Gynecologic History No LMP recorded. Patient has had a hysterectomy. Contraception: post menopausal status  Obstetric History OB History  No data available    Past Medical History  Diagnosis Date  . Hyperlipidemia   . Obesity   . Osteopenia   . Basal cell carcinoma     face, back, neck  . Migraine   . History of shingles     Past Surgical History  Procedure Laterality Date  . Cesarean section N/A     2  . Cholecystectomy    . Abdominal hysterectomy    . Rotator cuff repair      lt shoulder  . Basal cell removed    . Tubal ligation    . Cyst removed from wrist    . Wedge resection of ovary  1982     Current Outpatient Prescriptions on File Prior to Visit  Medication Sig Dispense Refill  . simvastatin (ZOCOR) 10 MG tablet Take 10 mg by mouth daily.    . traZODone (DESYREL) 100 MG tablet Take 100 mg by mouth at bedtime.     No current facility-administered medications on file prior to visit.    Allergies  Allergen Reactions  . Tetracyclines & Related Swelling and Rash    History   Social History  . Marital Status: Married    Spouse Name: N/A  . Number of Children: N/A  . Years of Education: N/A   Occupational History  . Not on file.   Social History Main Topics  . Smoking status: Never Smoker   . Smokeless tobacco: Not on file  . Alcohol Use: No  . Drug Use: No  . Sexual Activity: Yes   Other Topics Concern  . Not on file   Social History Narrative    Family History  Problem Relation Age of Onset  . Cancer Mother 93    breast  . Heart disease Mother   . Heart disease Father   . Diabetes Sister   . Diabetes Maternal Aunt   . Diabetes Maternal Uncle   . Diabetes Maternal Grandfather   . Colon cancer Neg Hx   . Ovarian cancer  Neg Hx     The following portions of the patient's history were reviewed and updated as appropriate: allergies, current medications, past family history, past medical history, past social history, past surgical history and problem list.  Review of Systems  Review of Systems - General ROS: negative for - chills, fatigue, fever, hot flashes, malaise or night sweats Hematological and Lymphatic ROS: negative for - bleeding problems or swollen lymph nodes Gastrointestinal ROS: negative for - abdominal pain, blood in stools, change in bowel habits and nausea/vomiting Musculoskeletal ROS: negative for - joint pain, muscle pain or muscular weakness Genito-Urinary ROS: negative for - dysuria, genital discharge, genital ulcers, hematuria, incontinence, irregular/heavy menses, nocturia or pelvic pain  Objective:   BP 164/90 mmHg  Pulse  76  Ht 5\' 3"  (1.6 m)  Wt 194 lb 1.6 oz (88.043 kg)  BMI 34.39 kg/m2 Exam deferred today.     Assessment:   1.  Increased BMI with recent weight gain, 14 pounds; thyroid testing was normal during ENT evaluation for reflux.Appetite suppressant with exercise and dietary modification strategies have proven ineffective over the past several years. 2.  Elevated blood pressure; family history of heart disease; patient needs evaluation and possible medical intervention.    Plan:  1.  Referral to Dr. Brigitte Pulse, primary care for hypertension evaluation. 2.  Referral to [facility] health bariatric clinic for consultation regarding weight loss management strategies with consideration towards possible surgical intervention.  A total of 15 minutes were spent face-to-face with the patient during this encounter and over half of that time dealt with counseling and coordination of care.

## 2014-07-05 NOTE — Patient Instructions (Signed)
Referral to Dr. Brigitte Pulse for Hypertension evaluation and management. Referral to [facility] Bariat[facility] Surgery for consultation.

## 2014-07-10 ENCOUNTER — Ambulatory Visit: Payer: Managed Care, Other (non HMO)

## 2014-07-10 ENCOUNTER — Ambulatory Visit
Admission: EM | Admit: 2014-07-10 | Discharge: 2014-07-10 | Disposition: A | Discharge status: Home / Self Care | Payer: Managed Care, Other (non HMO) | Attending: Family Medicine | Admitting: Family Medicine

## 2014-07-10 DIAGNOSIS — Z85828 Personal history of other malignant neoplasm of skin: Secondary | ICD-10-CM | POA: Insufficient documentation

## 2014-07-10 DIAGNOSIS — E785 Hyperlipidemia, unspecified: Secondary | ICD-10-CM | POA: Insufficient documentation

## 2014-07-10 DIAGNOSIS — R21 Rash and other nonspecific skin eruption: Secondary | ICD-10-CM | POA: Insufficient documentation

## 2014-07-10 DIAGNOSIS — M79674 Pain in right toe(s): Secondary | ICD-10-CM | POA: Diagnosis present

## 2014-07-10 DIAGNOSIS — E669 Obesity, unspecified: Secondary | ICD-10-CM | POA: Insufficient documentation

## 2014-07-10 MED ORDER — HYDROCODONE-ACETAMINOPHEN 5-325 MG PO TABS: 1.0000 | ORAL_TABLET | Freq: Four times a day (QID) | ORAL | Status: DC | PRN

## 2014-07-10 MED ORDER — PREDNISONE 20 MG PO TABS: 20.0000 mg | ORAL_TABLET | Freq: Every day | ORAL | Status: DC

## 2014-07-10 NOTE — ED Notes (Signed)
Seen here 2 weeks ago and Dx with rash. Given Prednisone and rash subsided. Yesterday, rash returned upper anterior chest, arm, posterior legs and posterior shoulder. Also started last Wednesday evening with right great toe pain with swelling. States very painful to bear weight

## 2014-07-10 NOTE — ED Provider Notes (Signed)
CSN: [id number]     Arrival date & time 07/10/14  1448 History   First MD Initiated Contact with Patient 07/10/14 1531     Chief Complaint  Patient presents with  . Rash  . Toe Pain   (Consider location/radiation/quality/duration/timing/severity/associated sxs/prior Treatment) HPI Comments: 54 yo female with a 2 weeks h/o intermittent itchy rash on anterior upper chest, forearms, upper back area. Was seen about 2 weeks ago, diagnosed with contact dermatitis but states she stopped the new soap she had been using. Rash has recurred. Itchiness is same day and night. Patient also complains of 5 days of right big toe pain, which started suddenly without any injuries. Denies fevers, chills.   Patient is a 54 y.o. female presenting with rash and toe pain.  Rash Toe Pain    Past Medical History  Diagnosis Date  . Hyperlipidemia   . Obesity   . Osteopenia   . Basal cell carcinoma     face, back, neck  . Migraine   . History of shingles    Past Surgical History  Procedure Laterality Date  . Cesarean section N/A     2  . Cholecystectomy    . Abdominal hysterectomy    . Rotator cuff repair      lt shoulder  . Basal cell removed    . Tubal ligation    . Cyst removed from wrist    . Wedge resection of ovary  1982   Family History  Problem Relation Age of Onset  . Cancer Mother 25    breast  . Heart disease Mother   . Heart disease Father   . Cancer Father   . Diabetes Sister   . Diabetes Maternal Aunt   . Diabetes Maternal Uncle   . Diabetes Maternal Grandfather   . Colon cancer Neg Hx   . Ovarian cancer Neg Hx    History  Substance Use Topics  . Smoking status: Never Smoker   . Smokeless tobacco: Not on file  . Alcohol Use: No   OB History    No data available     Review of Systems  Skin: Positive for rash.    Allergies  Tetracyclines & related  Home Medications   Prior to Admission medications   Medication Sig Start Date End Date Taking? Authorizing  Provider  estradiol (ESTRACE) 1 MG tablet Take 1 mg by mouth daily. 04/12/14  Yes Historical Provider, MD  hydrOXYzine (ATARAX/VISTARIL) 25 MG tablet Take 25 mg by mouth 3 (three) times daily as needed.   Yes Historical Provider, MD  omeprazole (PRILOSEC) 40 MG capsule TAKE ONE PILL ONCE DAILY, 30 MINUTES PRIOR TO EVENING MEAL. 06/30/14  Yes Historical Provider, MD  simvastatin (ZOCOR) 10 MG tablet Take 10 mg by mouth daily.   Yes Historical Provider, MD  traZODone (DESYREL) 100 MG tablet Take 100 mg by mouth at bedtime.   Yes Historical Provider, MD  HYDROcodone-acetaminophen (NORCO/VICODIN) 5-325 MG per tablet Take 1-2 tablets by mouth every 6 (six) hours as needed. 07/10/14   Norval Gable, MD  predniSONE (DELTASONE) 20 MG tablet Take 1 tablet (20 mg total) by mouth daily. 07/10/14   Norval Gable, MD   BP 158/74 mmHg  Pulse 86  Temp(Src) 98.5 F (36.9 C) (Oral)  Resp 18  Ht 5\' 3"  (1.6 m)  Wt 192 lb (87.091 kg)  BMI 34.02 kg/m2  SpO2 99% Physical Exam  Constitutional: She appears well-developed and well-nourished. No distress.  Musculoskeletal: She exhibits tenderness. She  exhibits no edema.       Right foot: There is bony tenderness.       Feet:  Mild edema and erythema to right 1st MTP joint with tenderness to palpation; skin intact; no lesion or rashes  Skin: Rash noted. Rash is papular. She is not diaphoretic.     Pinpoint papular rash to sun exposed areas of upper chest, forearms, and upper back area  Nursing note and vitals reviewed.   ED Course  Procedures (including critical care time) Labs Review Labs Reviewed - No data to display  Imaging Review Dg Toe Great Right  07/10/2014   CLINICAL DATA:  Right great toe pain for 6 days.  No known injury.  EXAM: RIGHT GREAT TOE  COMPARISON:  None.  FINDINGS: There is no evidence of fracture or dislocation. There is no evidence of arthropathy or other focal bone abnormality. Soft tissues are unremarkable.  IMPRESSION: Normal exam.    Electronically Signed   By: Lorriane Shire M.D.   On: 07/10/2014 15:28     MDM   1. Pain of toe of right foot   2. Rash and nonspecific skin eruption   (pain at right 1st MTP; likely gout)  Discharge Medication List as of 07/10/2014  3:51 PM    START taking these medications   Details  HYDROcodone-acetaminophen (NORCO/VICODIN) 5-325 MG per tablet Take 1-2 tablets by mouth every 6 (six) hours as needed., Starting 07/10/2014, Until Discontinued, Print    predniSONE (DELTASONE) 20 MG tablet Take 1 tablet (20 mg total) by mouth daily., Starting 07/10/2014, Until Discontinued, Normal      Plan: 1. x-ray results (negative/normal) and diagnosis reviewed with patient 2. rx as per orders; risks, benefits, potential side effects reviewed with patient 3. Recommend supportive treatment with otc tylenol, rest, increased fluids, otc cortisone cream, otc antihistamine prn 4. F/u prn if symptoms worsen or don't improve    Norval Gable, MD 07/10/14 1706

## 2014-07-17 ENCOUNTER — Encounter: Payer: Self-pay | Admitting: Family Medicine

## 2014-07-17 ENCOUNTER — Ambulatory Visit (INDEPENDENT_AMBULATORY_CARE_PROVIDER_SITE_OTHER): Payer: Managed Care, Other (non HMO) | Admitting: Family Medicine

## 2014-07-17 VITALS — BP 144/78 | HR 94 | Temp 97.9°F | Ht 63.0 in | Wt 193.9 lb

## 2014-07-17 DIAGNOSIS — G4700 Insomnia, unspecified: Secondary | ICD-10-CM

## 2014-07-17 DIAGNOSIS — I10 Essential (primary) hypertension: Secondary | ICD-10-CM | POA: Insufficient documentation

## 2014-07-17 DIAGNOSIS — R011 Cardiac murmur, unspecified: Secondary | ICD-10-CM

## 2014-07-17 DIAGNOSIS — R010 Benign and innocent cardiac murmurs: Secondary | ICD-10-CM

## 2014-07-17 HISTORY — DX: Essential (primary) hypertension: I10

## 2014-07-17 MED ORDER — TRAZODONE HCL 100 MG PO TABS
100.0000 mg | ORAL_TABLET | Freq: Every day | ORAL | Status: DC
Start: 2014-07-17 — End: 2014-12-07

## 2014-07-17 MED ORDER — METOPROLOL TARTRATE 25 MG PO TABS: 25.0000 mg | ORAL_TABLET | Freq: Two times a day (BID) | ORAL | Status: DC

## 2014-07-17 NOTE — Progress Notes (Signed)
Name: Kristin [name]   MRN: [id number]    DOB: 07/19/1960   Date:07/17/2014       Progress Note  Subjective  Chief Complaint  Chief Complaint  Patient presents with  . Establish Care    Dr Marguerite Olea patient, concerned about B/P    HPI Insomnia: this patient presents with follow-up for insomnia. Symptoms of insomnia are as described as repeated nighttime awakenings, frequent tossing and turning in bed, and being unable to "shut her brain off". She is taking Trazodone 100mg  qhs. This helps with falling asleep but not so much with saying asleep. She was on 150mg  earlier which made her drowsy the next day and was dropped down to 100 mg. Elevated BP: Patient reports that her blood pressure stays elevated usually around 140s-150s over 70s-80s.she has never been diagnosed with hypertension and has never been on any antihypertensive medications. Last week, her blood pressure was elevated at the gynecologist's office at 164/90.she denies any chest pain, shortness of breath, headaches, visual disturbance, or other concerning symptoms  Past Medical History  Diagnosis Date  . Hyperlipidemia   . Obesity   . Osteopenia   . Migraine   . History of shingles   . Basal cell carcinoma     face, back, neck    Past Surgical History  Procedure Laterality Date  . Cholecystectomy    . Rotator cuff repair      lt shoulder  . Basal cell removed    . Tubal ligation    . Cyst removed from wrist    . Wedge resection of ovary  1982  . Abdominal hysterectomy    . Cesarean section N/A     2    Family History  Problem Relation Age of Onset  . Cancer Mother 57    breast  . Heart disease Mother   . Heart disease Father   . Cancer Father   . Diabetes Sister   . Diabetes Maternal Aunt   . Diabetes Maternal Uncle   . Diabetes Maternal Grandfather   . Colon cancer Neg Hx   . Ovarian cancer Neg Hx     History   Social History  . Marital Status: Married    Spouse Name: N/A  . Number of  Children: N/A  . Years of Education: N/A   Occupational History  . Not on file.   Social History Main Topics  . Smoking status: Never Smoker   . Smokeless tobacco: Not on file  . Alcohol Use: No  . Drug Use: No  . Sexual Activity: Yes   Other Topics Concern  . Not on file   Social History Narrative     Current outpatient prescriptions:  .  estradiol (ESTRACE) 1 MG tablet, Take 1 mg by mouth daily., Disp: , Rfl: 1 .  omeprazole (PRILOSEC) 40 MG capsule, TAKE ONE PILL ONCE DAILY, 30 MINUTES PRIOR TO EVENING MEAL., Disp: , Rfl: 12 .  simvastatin (ZOCOR) 10 MG tablet, Take 10 mg by mouth daily., Disp: , Rfl:  .  traZODone (DESYREL) 100 MG tablet, Take 100 mg by mouth at bedtime., Disp: , Rfl:  .  ascorbic acid (VITAMIN C) 500 MG tablet, Take 1 tablet by mouth daily., Disp: , Rfl:  .  HYDROcodone-acetaminophen (NORCO/VICODIN) 5-325 MG per tablet, Take 1-2 tablets by mouth every 6 (six) hours as needed. (Patient not taking: Reported on 07/17/2014), Disp: 10 tablet, Rfl: 0 .  hydrOXYzine (ATARAX/VISTARIL) 25 MG tablet, Take 25 mg by mouth 3 (  three) times daily as needed., Disp: , Rfl:  .  MULTIPLE VITAMIN PO, Take 1 tablet by mouth daily., Disp: , Rfl:  .  Omega-3 Krill Oil 500 MG CAPS, Take 1 capsule by mouth daily., Disp: , Rfl:  .  predniSONE (DELTASONE) 20 MG tablet, Take 1 tablet (20 mg total) by mouth daily. (Patient not taking: Reported on 07/17/2014), Disp: 7 tablet, Rfl: 0  Allergies  Allergen Reactions  . Tetracyclines & Related Swelling and Rash     Review of Systems  Constitutional: Negative for fever and chills.  Respiratory: Negative for cough and shortness of breath.   Cardiovascular: Negative for chest pain, palpitations, orthopnea and leg swelling.  Neurological: Positive for headaches.  Psychiatric/Behavioral: Positive for depression. Negative for substance abuse. The patient has insomnia. The patient is not nervous/anxious.       Objective  Filed Vitals:    07/17/14 1402  BP: 144/78  Pulse: 94  Temp: 97.9 F (36.6 C)  TempSrc: Oral  Height: 5\' 3"  (1.6 m)  Weight: 193 lb 14.4 oz (87.952 kg)  SpO2: 97%    Physical Exam  Constitutional: She is oriented to person, place, and time and well-developed, well-nourished, and in no distress.  HENT:  Head: Normocephalic and atraumatic.  Mouth/Throat: Oropharynx is clear and moist.  Eyes: Conjunctivae are normal. Pupils are equal, round, and reactive to light.  Cardiovascular: Normal rate and regular rhythm.   Murmur heard. Pulmonary/Chest: Effort normal and breath sounds normal.  Neurological: She is alert and oriented to person, place, and time.  Skin: Skin is warm and dry.  Psychiatric: Memory, affect and judgment normal.  Nursing note and vitals reviewed.     Assessment & Plan 1. Essential hypertension Patient will be started on low-dose beta blocker for treatment of hypertension. In addition, this should help prevent her migraine headaches. Patient will follow-up in one month for blood pressure assessment. - metoprolol tartrate (LOPRESSOR) 25 MG tablet; Take 1 tablet (25 mg total) by mouth 2 (two) times daily.  Dispense: 180 tablet; Refill: 0  2. Cannot sleep Symptoms stable on therapy. Continue current medication. - traZODone (DESYREL) 100 MG tablet; Take 1 tablet (100 mg total) by mouth at bedtime.  Dispense: 90 tablet; Refill: 0  3. Heart murmur previously undiagnosed  - Ambulatory referral to Cardiology  There are no diagnoses linked to this encounter.  Samoria Fedorko Asad A[facility] 07/17/2014 2:27 PM

## 2014-07-20 ENCOUNTER — Other Ambulatory Visit: Payer: Self-pay | Admitting: Specialist

## 2014-07-21 NOTE — Addendum Note (Signed)
Addended by: Elouise Munroe on: 07/21/2014 11:23 AM   Modules accepted: Orders

## 2014-07-25 ENCOUNTER — Ambulatory Visit
Admission: RE | Admit: 2014-07-25 | Discharge: 2014-07-25 | Disposition: A | Discharge status: Home / Self Care | Payer: 59 | Source: Ambulatory Visit | Attending: Specialist | Admitting: Specialist

## 2014-07-25 DIAGNOSIS — I10 Essential (primary) hypertension: Secondary | ICD-10-CM | POA: Insufficient documentation

## 2014-08-01 ENCOUNTER — Encounter: Payer: Self-pay | Admitting: Obstetrics and Gynecology

## 2014-08-01 ENCOUNTER — Ambulatory Visit (INDEPENDENT_AMBULATORY_CARE_PROVIDER_SITE_OTHER): Payer: Managed Care, Other (non HMO) | Admitting: Obstetrics and Gynecology

## 2014-08-01 VITALS — BP 156/76 | HR 86 | Ht 62.0 in | Wt 195.6 lb

## 2014-08-01 DIAGNOSIS — Z01419 Encounter for gynecological examination (general) (routine) without abnormal findings: Secondary | ICD-10-CM

## 2014-08-01 DIAGNOSIS — Z1231 Encounter for screening mammogram for malignant neoplasm of breast: Secondary | ICD-10-CM

## 2014-08-01 DIAGNOSIS — E119 Type 2 diabetes mellitus without complications: Secondary | ICD-10-CM | POA: Diagnosis not present

## 2014-08-01 DIAGNOSIS — E1169 Type 2 diabetes mellitus with other specified complication: Secondary | ICD-10-CM

## 2014-08-01 DIAGNOSIS — E669 Obesity, unspecified: Secondary | ICD-10-CM | POA: Diagnosis not present

## 2014-08-01 DIAGNOSIS — Z0000 Encounter for general adult medical examination without abnormal findings: Secondary | ICD-10-CM | POA: Diagnosis not present

## 2014-08-01 DIAGNOSIS — Z780 Asymptomatic menopausal state: Secondary | ICD-10-CM

## 2014-08-01 DIAGNOSIS — R638 Other symptoms and signs concerning food and fluid intake: Secondary | ICD-10-CM

## 2014-08-01 DIAGNOSIS — N952 Postmenopausal atrophic vaginitis: Secondary | ICD-10-CM

## 2014-08-01 NOTE — Progress Notes (Signed)
Patient ID: CERENITY GOSHORN, female   DOB: February 19, 1960, 54 y.o.   MRN: [id number] ANNUAL PREVENTATIVE CARE GYN  ENCOUNTER NOTE  Subjective:       Mikaela Hilgeman Lamore is a 54 y.o. No obstetric history on file. female here for a routine annual gynecologic exam.  Current complaints: 1.  none    Gynecologic History No LMP recorded. Patient has had a hysterectomy. Contraception: status post hysterectomy Last Pap: no further paps. Results were: normal Last mammogram: 4/ 2016 left breast f/u 09/2014. Results were: normal  Obstetric History OB History  No data available    Past Medical History  Diagnosis Date  . Hyperlipidemia   . Obesity   . Osteopenia   . Migraine   . History of shingles   . Basal cell carcinoma     face, back, neck  . Hypertension   . Gout     Past Surgical History  Procedure Laterality Date  . Cholecystectomy    . Rotator cuff repair      lt shoulder  . Basal cell removed    . Tubal ligation    . Cyst removed from wrist    . Wedge resection of ovary  1982  . Abdominal hysterectomy    . Cesarean section N/A     2    Current Outpatient Prescriptions on File Prior to Visit  Medication Sig Dispense Refill  . ascorbic acid (VITAMIN C) 500 MG tablet Take 1 tablet by mouth daily.    Marland Kitchen estradiol (ESTRACE) 1 MG tablet Take 1 mg by mouth daily.  1  . metoprolol tartrate (LOPRESSOR) 25 MG tablet Take 1 tablet (25 mg total) by mouth 2 (two) times daily. 180 tablet 0  . MULTIPLE VITAMIN PO Take 1 tablet by mouth daily.    . Omega-3 Krill Oil 500 MG CAPS Take 1 capsule by mouth daily.    Marland Kitchen omeprazole (PRILOSEC) 40 MG capsule TAKE ONE PILL ONCE DAILY, 30 MINUTES PRIOR TO EVENING MEAL.  12  . simvastatin (ZOCOR) 10 MG tablet Take 10 mg by mouth daily.    . traZODone (DESYREL) 100 MG tablet Take 1 tablet (100 mg total) by mouth at bedtime. 90 tablet 0   No current facility-administered medications on file prior to visit.    Allergies  Allergen Reactions  .  Tetracyclines & Related Swelling and Rash    History   Social History  . Marital Status: Married    Spouse Name: N/A  . Number of Children: N/A  . Years of Education: N/A   Occupational History  . Not on file.   Social History Main Topics  . Smoking status: Never Smoker   . Smokeless tobacco: Not on file  . Alcohol Use: No  . Drug Use: No  . Sexual Activity: Yes   Other Topics Concern  . Not on file   Social History Narrative    Family History  Problem Relation Age of Onset  . Cancer Mother 47    breast  . Heart disease Mother   . Heart disease Father   . Cancer Father   . Diabetes Sister   . Diabetes Maternal Aunt   . Diabetes Maternal Uncle   . Diabetes Maternal Grandfather   . Colon cancer Neg Hx   . Ovarian cancer Neg Hx     The following portions of the patient's history were reviewed and updated as appropriate: allergies, current medications, past family history, past medical history, past social history, past  surgical history and problem list.  Review of Systems ROS Review of Systems - General ROS: negative for - chills, fatigue, fever, hot flashes, night sweats, weight gain or weight loss Psychological ROS: negative for - anxiety, decreased libido, depression, mood swings, physical abuse or sexual abuse Ophthalmic ROS: negative for - blurry vision, eye pain or loss of vision ENT ROS: negative for - headaches, hearing change, visual changes or vocal changes Allergy and Immunology ROS: negative for - hives, itchy/watery eyes or seasonal allergies Hematological and Lymphatic ROS: negative for - bleeding problems, bruising, swollen lymph nodes or weight loss Endocrine ROS: negative for - galactorrhea, hair pattern changes, hot flashes, malaise/lethargy, mood swings, palpitations, polydipsia/polyuria, skin changes, temperature intolerance or unexpected weight changes Breast ROS: negative for - new or changing breast lumps or nipple discharge Respiratory ROS:  negative for - cough or shortness of breath Cardiovascular ROS: negative for - chest pain, irregular heartbeat, palpitations or shortness of breath Gastrointestinal ROS: no abdominal pain, change in bowel habits, or black or bloody stools Genito-Urinary ROS: no dysuria, trouble voiding, or hematuria Musculoskeletal ROS: negative for - joint pain or joint stiffness Neurological ROS: negative for - bowel and bladder control changes Dermatological ROS: negative for rash and skin lesion changes   Objective:   BP 156/76 mmHg  Pulse 86  Ht 5\' 2"  (1.575 m)  Wt 195 lb 9.6 oz (88.724 kg)  BMI 35.77 kg/m2 CONSTITUTIONAL: Well-developed, well-nourished female in no acute distress.  PSYCHIATRIC: Normal mood and affect. Normal behavior. Normal judgment and thought content. [place]: Alert and oriented to person, place, and time. Normal muscle tone coordination. No cranial nerve deficit noted. HENT:  Normocephalic, atraumatic, External right and left ear normal. Oropharynx is clear and moist EYES: Conjunctivae and EOM are normal. Pupils are equal, round, and reactive to light. No scleral icterus.  NECK: Normal range of motion, supple, no masses.  Normal thyroid.  SKIN: Skin is warm and dry. No rash noted. Not diaphoretic. No erythema. No pallor. CARDIOVASCULAR: Normal heart rate noted, regular rhythm, no murmur. RESPIRATORY: Clear to auscultation bilaterally. Effort and breath sounds normal, no problems with respiration noted. BREASTS: Symmetric in size. No masses, skin changes, nipple drainage, or lymphadenopathy. ABDOMEN: Soft, normal bowel sounds, no distention noted.  No tenderness, rebound or guarding.  BLADDER: Normal PELVIC:  External Genitalia: Normal  BUS: Normal  Vagina: Normal  Cervix: Normal  Uterus: Normal  Adnexa: Normal  RV: External Exam NormaI, No Rectal Masses and Normal Sphincter tone  MUSCULOSKELETAL: Normal range of motion. No tenderness.  No cyanosis, clubbing, or edema.   2+ distal pulses. LYMPHATIC: No Axillary, Supraclavicular, or Inguinal Adenopathy.    Assessment:   Annual gynecologic examination 54 y.o. Contraception: status post hysterectomy Obesity 1 Problem List Items Addressed This Visit    None      Plan:  Pap: Not needed Mammogram: Ordered Stool Guaiac Testing:  Ordered Labs:  Completed by Dr. Darnell Level  Routine preventative health maintenance measures emphasized: Exercise/Diet/Weight control and Alcohol/Substance use risks Letter of referral for bariatric surgery is written. Return to [place], [place]   Brayton Mars, MD

## 2014-08-01 NOTE — Patient Instructions (Addendum)
1.  Referral letter for bariatric surgery intervention is to be completed for patient. 2.  Mammogram is due in September 2016. 3.  Stool guaiac cards are given for colon cancer screening. 4.  Continue with diet and exercise in an attempt to optimize weight loss. 5.  Patient is to see Dr. Derek Jack for evaluation of blood pressure and possible evaluation and treatment for adult-onset diabetes (hemoglobin A1c is 6.5)  LETTER of referral for bariatric surgery: To Whom It May Concern: Kristin [name] is a patient of mine who has worked extensively on conservative measures for weight loss including diet, exercise, and phentermine interventions.  She has had difficulty in achieving ongoing success with weight loss.  She is very much interested in proceeding with bariatric surgery referral.  Review of lab work demonstrates probable type 2 diabetes mellitus in a patient with obesity diagnosis.  She is being referred to her internist for further management of this condition along with her hypertension. I am now recommending that she be approved for the bariatric surgical program as she is deemed an excellent candidate. She you have any questions, please feel free to contact me. Brayton Mars, M.D., Cherlynn June

## 2014-08-08 ENCOUNTER — Telehealth: Payer: Self-pay | Admitting: Obstetrics and Gynecology

## 2014-08-08 NOTE — Telephone Encounter (Signed)
Patient called in regards to a letter she requested at her last visit from Dr D. The Letter was to state that he recommended her for bariatric surgery.

## 2014-08-09 ENCOUNTER — Ambulatory Visit (INDEPENDENT_AMBULATORY_CARE_PROVIDER_SITE_OTHER): Payer: Managed Care, Other (non HMO) | Admitting: Family Medicine

## 2014-08-09 ENCOUNTER — Encounter: Payer: Self-pay | Admitting: Family Medicine

## 2014-08-09 VITALS — BP 147/78 | HR 94 | Temp 98.0°F | Resp 19 | Ht 62.0 in | Wt 198.0 lb

## 2014-08-09 DIAGNOSIS — E119 Type 2 diabetes mellitus without complications: Secondary | ICD-10-CM | POA: Diagnosis not present

## 2014-08-09 DIAGNOSIS — L309 Dermatitis, unspecified: Secondary | ICD-10-CM | POA: Insufficient documentation

## 2014-08-09 DIAGNOSIS — I10 Essential (primary) hypertension: Secondary | ICD-10-CM

## 2014-08-09 MED ORDER — LOSARTAN POTASSIUM 50 MG PO TABS: 50.0000 mg | ORAL_TABLET | Freq: Every day | ORAL | Status: DC

## 2014-08-09 MED ORDER — TRIAMCINOLONE ACETONIDE 0.5 % EX OINT: 1.0000 "application " | TOPICAL_OINTMENT | Freq: Two times a day (BID) | CUTANEOUS | Status: DC

## 2014-08-09 NOTE — Telephone Encounter (Signed)
Pt aware letter is under her pt education. Encouraged to use MYchart. Printed encounter note for pt to p/u at her request.

## 2014-08-09 NOTE — Progress Notes (Signed)
Name: Kristin [name]   MRN: [id number]    DOB: [birth year]   Date:08/09/2014       Progress Note  Subjective  Chief Complaint  Chief Complaint  Patient presents with  . Follow-up    1 mo. /f/u to check how medicaitons from last visit are working for BP/Insomnia  . Hypertension    Hypertension This is a new problem. The problem is controlled. Associated symptoms include shortness of breath (noticed worsening shortness of breath recently.). Pertinent negatives include no chest pain, headaches or palpitations. Past treatments include beta blockers. The current treatment provides moderate improvement. Compliance problems include medication side effects (pt. thinks Metoprolol gave her diarrhea and she has cut down to 1 tablet daily but the diarrhea still has not resolved.).  There is no history of angina, kidney disease, CAD/MI or CVA.  Rash This is a new problem. The current episode started in the past 7 days. The affected locations include the left lower leg. The rash is characterized by itchiness and dryness. She was exposed to nothing. Associated symptoms include shortness of breath (noticed worsening shortness of breath recently.). Past treatments include topical steroids. The treatment provided mild relief.      Past Medical History  Diagnosis Date  . Hyperlipidemia   . Obesity   . Osteopenia   . Migraine   . History of shingles   . Basal cell carcinoma     face, back, neck  . Hypertension   . Gout     Past Surgical History  Procedure Laterality Date  . Cholecystectomy    . Rotator cuff repair      lt shoulder  . Basal cell removed    . Tubal ligation    . Cyst removed from wrist    . Wedge resection of ovary  1982  . Abdominal hysterectomy    . Cesarean section N/A     2    Family History  Problem Relation Age of Onset  . Cancer Mother 55    breast  . Heart disease Mother   . Heart disease Father   . Cancer Father   . Diabetes Sister   . Diabetes Maternal  Aunt   . Diabetes Maternal Uncle   . Diabetes Maternal Grandfather   . Colon cancer Neg Hx   . Ovarian cancer Neg Hx     History   Social History  . Marital Status: Married    Spouse Name: N/A  . Number of Children: N/A  . Years of Education: N/A   Occupational History  . Not on file.   Social History Main Topics  . Smoking status: Never Smoker   . Smokeless tobacco: Not on file  . Alcohol Use: No  . Drug Use: No  . Sexual Activity: Yes   Other Topics Concern  . Not on file   Social History Narrative     Current outpatient prescriptions:  .  ascorbic acid (VITAMIN C) 500 MG tablet, Take 1 tablet by mouth daily., Disp: , Rfl:  .  estradiol (ESTRACE) 1 MG tablet, Take 1 mg by mouth daily., Disp: , Rfl: 1 .  metoprolol tartrate (LOPRESSOR) 25 MG tablet, Take 1 tablet (25 mg total) by mouth 2 (two) times daily., Disp: 180 tablet, Rfl: 0 .  MULTIPLE VITAMIN PO, Take 1 tablet by mouth daily., Disp: , Rfl:  .  Omega-3 Krill Oil 500 MG CAPS, Take 1 capsule by mouth daily., Disp: , Rfl:  .  omeprazole (PRILOSEC) 40  MG capsule, TAKE ONE PILL ONCE DAILY, 30 MINUTES PRIOR TO EVENING MEAL., Disp: , Rfl: 12 .  simvastatin (ZOCOR) 10 MG tablet, Take 10 mg by mouth daily., Disp: , Rfl:  .  traZODone (DESYREL) 100 MG tablet, Take 1 tablet (100 mg total) by mouth at bedtime., Disp: 90 tablet, Rfl: 0  Allergies  Allergen Reactions  . Tetracyclines & Related Swelling and Rash     Review of Systems  Respiratory: Positive for shortness of breath (noticed worsening shortness of breath recently.).   Cardiovascular: Negative for chest pain and palpitations.  Skin: Positive for rash.  Neurological: Negative for headaches.      Objective  Filed Vitals:   08/09/14 1429  BP: 147/78  Pulse: 94  Temp: 98 F (36.7 C)  TempSrc: Oral  Resp: 19  Height: 5\' 2"  (1.575 m)  Weight: 198 lb (89.812 kg)  SpO2: 98%    Physical Exam  Constitutional: She is oriented to person, place, and  time and well-developed, well-nourished, and in no distress.  Cardiovascular: Normal rate and regular rhythm.   Pulmonary/Chest: Effort normal and breath sounds normal.  Neurological: She is alert and oriented to person, place, and time.  Skin: Skin is warm and dry. Rash (papulo-pustular pruritic rash over the back of left leg.) noted.  Psychiatric: Mood, memory, affect and judgment normal.  Nursing note and vitals reviewed.    Assessment & Plan 1. Essential hypertension  - losartan (COZAAR) 50 MG tablet; Take 1 tablet (50 mg total) by mouth daily.  Dispense: 90 tablet; Refill: 3  2. Dermatitis of lower extremity  - triamcinolone ointment (KENALOG) 0.5 %; Apply 1 application topically 2 (two) times daily.  Dispense: 30 g; Refill: 0  3. Diabetes mellitus type 2, controlled Follow up with review of labs.  Braiden Rodman [facility] 08/09/2014 3:02 PM

## 2014-08-14 DIAGNOSIS — I10 Essential (primary) hypertension: Secondary | ICD-10-CM

## 2014-08-14 DIAGNOSIS — R002 Palpitations: Secondary | ICD-10-CM | POA: Insufficient documentation

## 2014-08-14 HISTORY — DX: Essential (primary) hypertension: I10

## 2014-08-14 HISTORY — DX: Palpitations: R00.2

## 2014-08-30 ENCOUNTER — Telehealth: Payer: Self-pay | Admitting: Obstetrics and Gynecology

## 2014-08-30 MED ORDER — FLUCONAZOLE 150 MG PO TABS: 150.0000 mg | ORAL_TABLET | Freq: Once | ORAL | Status: DC

## 2014-08-30 NOTE — Telephone Encounter (Signed)
Left message that medication will be sent to pharmacy. (Diflucan 150mg . 1 tab #1; and 1 refill). If any problems with rx to contact office.  Pt returned call and aware rx for Diflucan sent to pharmacy.

## 2014-08-30 NOTE — Telephone Encounter (Signed)
PT CALLED AND SHE STATED SHE HAS ANOTHER YEAST INFECTION AND SHE WANTED TO KNOW IF A RX COULD BE CALLED IN FOR HER.

## 2014-08-31 ENCOUNTER — Telehealth: Payer: Self-pay

## 2014-08-31 ENCOUNTER — Encounter: Payer: Self-pay | Admitting: Cardiovascular Disease

## 2014-08-31 ENCOUNTER — Encounter: Payer: Self-pay | Admitting: Family Medicine

## 2014-08-31 NOTE — Telephone Encounter (Signed)
Non-Urgent Medical Question  Message [phone]   From  Elvira Langston Wisz   To  Wellington Hampshire, MD   Sent  08/31/2014 3:18 PM     Thank you but I have seen another DR. and will not need your services.  Arie Watson    Appt cancelled on schedule 8/11 3:45pm

## 2014-09-06 ENCOUNTER — Ambulatory Visit: Payer: Managed Care, Other (non HMO) | Admitting: Family Medicine

## 2014-09-08 ENCOUNTER — Ambulatory Visit: Payer: 59 | Admitting: Cardiovascular Disease

## 2014-09-29 DIAGNOSIS — K219 Gastro-esophageal reflux disease without esophagitis: Secondary | ICD-10-CM | POA: Insufficient documentation

## 2014-09-29 DIAGNOSIS — K449 Diaphragmatic hernia without obstruction or gangrene: Secondary | ICD-10-CM | POA: Insufficient documentation

## 2014-10-04 ENCOUNTER — Encounter: Payer: Self-pay | Admitting: Specialist

## 2014-10-10 DIAGNOSIS — G4733 Obstructive sleep apnea (adult) (pediatric): Secondary | ICD-10-CM | POA: Insufficient documentation

## 2014-10-11 ENCOUNTER — Other Ambulatory Visit: Payer: Self-pay | Admitting: Obstetrics and Gynecology

## 2014-12-07 ENCOUNTER — Encounter: Payer: Self-pay | Admitting: Family Medicine

## 2014-12-07 ENCOUNTER — Ambulatory Visit (INDEPENDENT_AMBULATORY_CARE_PROVIDER_SITE_OTHER): Payer: Managed Care, Other (non HMO) | Admitting: Family Medicine

## 2014-12-07 VITALS — BP 132/75 | HR 80 | Temp 98.5°F | Resp 17 | Ht 62.0 in | Wt 173.1 lb

## 2014-12-07 DIAGNOSIS — G4700 Insomnia, unspecified: Secondary | ICD-10-CM

## 2014-12-07 DIAGNOSIS — M25521 Pain in right elbow: Secondary | ICD-10-CM | POA: Diagnosis not present

## 2014-12-07 MED ORDER — ESZOPICLONE 1 MG PO TABS: 1.0000 mg | ORAL_TABLET | Freq: Every evening | ORAL | Status: DC | PRN

## 2014-12-07 NOTE — Progress Notes (Signed)
Name: Kristin [name]   MRN: [id number]    DOB: July 27, 1960   Date:12/07/2014       Progress Note  Subjective  Chief Complaint  Chief Complaint  Patient presents with  . Insomnia  . Elbow Injury    Rt elbow pain  . Medication Problem    Trazodone patient states she unable to keep down   Insomnia Primary symptoms: fragmented sleep, difficulty falling asleep, frequent awakening.  The symptoms are relieved by medication (Trazodone 100mg  which seemed to work well for many years, now she has N/V with Trazodone.). The treatment provided moderate relief.   Pain In Elbow Pt. With complaints of intermittent right elbow pain, worse with extension. No trauma. Started with gradual onset tenderness and then pain on movement. Applying heat and ice has helped.  Past Medical History  Diagnosis Date  . Hyperlipidemia   . Obesity   . Osteopenia   . Migraine   . History of shingles   . Basal cell carcinoma     face, back, neck  . Hypertension   . Gout     Past Surgical History  Procedure Laterality Date  . Cholecystectomy    . Rotator cuff repair      lt shoulder  . Basal cell removed    . Tubal ligation    . Cyst removed from wrist    . Wedge resection of ovary  1982  . Abdominal hysterectomy    . Cesarean section N/A     2    Family History  Problem Relation Age of Onset  . Cancer Mother 23    breast  . Heart disease Mother   . Heart disease Father   . Cancer Father   . Diabetes Sister   . Diabetes Maternal Aunt   . Diabetes Maternal Uncle   . Diabetes Maternal Grandfather   . Colon cancer Neg Hx   . Ovarian cancer Neg Hx     Social History   Social History  . Marital Status: Married    Spouse Name: N/A  . Number of Children: N/A  . Years of Education: N/A   Occupational History  . Not on file.   Social History Main Topics  . Smoking status: Never Smoker   . Smokeless tobacco: Not on file  . Alcohol Use: No  . Drug Use: No  . Sexual Activity: Yes    Other Topics Concern  . Not on file   Social History Narrative     Current outpatient prescriptions:  .  traZODone (DESYREL) 100 MG tablet, Take 1 tablet (100 mg total) by mouth at bedtime. (Patient not taking: Reported on 12/07/2014), Disp: 90 tablet, Rfl: 0  Allergies  Allergen Reactions  . Tetracyclines & Related Swelling and Rash    FACE SWELL, RASH ON CHEST  . Metoprolol Diarrhea  . Alcohol Rash    ABDOMINAL CRAMPS   Review of Systems  Constitutional: Negative for fever and chills.  Musculoskeletal: Positive for joint pain.  Psychiatric/Behavioral: The patient has insomnia.     Objective  Filed Vitals:   12/07/14 1558  BP: 132/75  Pulse: 80  Temp: 98.5 F (36.9 C)  TempSrc: Oral  Resp: 17  Height: 5\' 2"  (1.575 m)  Weight: 173 lb 1.6 oz (78.518 kg)  SpO2: 97%    Physical Exam  Constitutional: She is oriented to person, place, and time and well-developed, well-nourished, and in no distress.  Cardiovascular: Normal rate, regular rhythm and normal heart sounds.  Pulmonary/Chest: Effort normal and breath sounds normal. She has no wheezes.  Abdominal: Soft. Bowel sounds are normal. There is no tenderness.  Musculoskeletal:       Right elbow: She exhibits swelling. She exhibits no effusion and no deformity. Tenderness found. Medial epicondyle tenderness noted.       Arms: Neurological: She is alert and oriented to person, place, and time.  Psychiatric: Memory, affect and judgment normal.  Nursing note and vitals reviewed.   Assessment & Plan  1. Cannot sleep DC trazodone and will start on Lunesta 1 mg at bedtime. Educated on the dependence potential and possible adverse effects. Follow-up in one month. - eszopiclone (LUNESTA) 1 MG TABS tablet; Take 1 tablet (1 mg total) by mouth at bedtime as needed for sleep. Take immediately before bedtime  Dispense: 30 tablet; Refill: 0  2. Pain in right elbow Unclear etiology. We'll obtain x-rays of right elbow and  follow-up. - DG Elbow Complete Right; Future   [name] 12/07/2014 4:46 PM

## 2014-12-11 ENCOUNTER — Ambulatory Visit
Admission: RE | Admit: 2014-12-11 | Discharge: 2014-12-11 | Disposition: A | Discharge status: Home / Self Care | Payer: Managed Care, Other (non HMO) | Source: Ambulatory Visit | Attending: Family Medicine | Admitting: Family Medicine

## 2014-12-11 DIAGNOSIS — M25521 Pain in right elbow: Secondary | ICD-10-CM | POA: Diagnosis present

## 2014-12-11 DIAGNOSIS — W19XXXA Unspecified fall, initial encounter: Secondary | ICD-10-CM | POA: Diagnosis not present

## 2014-12-11 DIAGNOSIS — S52124A Nondisplaced fracture of head of right radius, initial encounter for closed fracture: Secondary | ICD-10-CM | POA: Diagnosis not present

## 2014-12-13 ENCOUNTER — Other Ambulatory Visit: Payer: Self-pay | Admitting: Family Medicine

## 2014-12-13 DIAGNOSIS — M25521 Pain in right elbow: Secondary | ICD-10-CM

## 2014-12-27 ENCOUNTER — Telehealth: Payer: Self-pay | Admitting: Family Medicine

## 2014-12-27 NOTE — Telephone Encounter (Signed)
Pt would like a call back about her Lunesta.

## 2014-12-29 NOTE — Telephone Encounter (Signed)
Routed to Dr. Shah for medication change 

## 2014-12-31 NOTE — Telephone Encounter (Signed)
Please schedule patient for an appointment to discuss changing the medication.

## 2015-01-02 ENCOUNTER — Encounter: Payer: Self-pay | Admitting: *Deleted

## 2015-01-02 ENCOUNTER — Ambulatory Visit
Admission: EM | Admit: 2015-01-02 | Discharge: 2015-01-02 | Disposition: A | Discharge status: Home / Self Care | Payer: Managed Care, Other (non HMO) | Attending: Family Medicine | Admitting: Family Medicine

## 2015-01-02 ENCOUNTER — Other Ambulatory Visit: Payer: Self-pay | Admitting: Obstetrics and Gynecology

## 2015-01-02 DIAGNOSIS — B86 Scabies: Secondary | ICD-10-CM

## 2015-01-02 MED ORDER — PREDNISONE 50 MG PO TABS: 50.0000 mg | ORAL_TABLET | Freq: Every day | ORAL | Status: DC

## 2015-01-02 MED ORDER — PERMETHRIN 5 % EX CREA: TOPICAL_CREAM | CUTANEOUS | Status: DC

## 2015-01-02 NOTE — ED Provider Notes (Signed)
CSN: [id number]     Arrival date & time 01/02/15  1158 History   First MD Initiated Contact with Patient 01/02/15 1348     Chief Complaint  Patient presents with  . Rash    generalized over the body   (Consider location/radiation/quality/duration/timing/severity/associated sxs/prior Treatment) HPI   Kristin [name] is a 54 y.o. female who presents for rash.  She first noticed a patch of red bumps in L groin while showering last night.  This morning she noticed a few spots on her lower abdomen, legs and arms.  Since then, the rash has spread to back and more lesions are arising over entire body.  The lesions are red, raised, and look like pimples.  She denies any new exposures to detergents, soaps, etc.  The rash is itchy only when fabrics are touching it.  Her husband, who sleeps in the same bed does not have a rash.  She does have a dog that sleeps in her bed as well, but he does not have fleas.  Past Medical History  Diagnosis Date  . Hyperlipidemia   . Obesity   . Osteopenia   . Migraine   . History of shingles   . Basal cell carcinoma     face, back, neck  . Hypertension   . Gout    Past Surgical History  Procedure Laterality Date  . Cholecystectomy    . Rotator cuff repair      lt shoulder  . Basal cell removed    . Tubal ligation    . Cyst removed from wrist    . Wedge resection of ovary  1982  . Abdominal hysterectomy    . Cesarean section N/A     2   Family History  Problem Relation Age of Onset  . Cancer Mother 66    breast  . Heart disease Mother   . Heart disease Father   . Cancer Father   . Diabetes Sister   . Diabetes Maternal Aunt   . Diabetes Maternal Uncle   . Diabetes Maternal Grandfather   . Colon cancer Neg Hx   . Ovarian cancer Neg Hx    Social History  Substance Use Topics  . Smoking status: Never Smoker   . Smokeless tobacco: Never Used  . Alcohol Use: No   OB History    No data available     Review of Systems   Constitutional: Negative.   HENT: Negative.   Eyes: Negative.   Respiratory: Negative.   Cardiovascular: Negative.   Gastrointestinal: Negative.   Endocrine: Negative.   Genitourinary: Negative.   Musculoskeletal: Negative.   Skin: Positive for rash.  Neurological: Negative.   Psychiatric/Behavioral: Negative.     Allergies  Tetracyclines & related; Metoprolol; and Alcohol  Home Medications   Prior to Admission medications   Medication Sig Start Date End Date Taking? Authorizing Provider  eszopiclone (LUNESTA) 1 MG TABS tablet Take 1 tablet (1 mg total) by mouth at bedtime as needed for sleep. Take immediately before bedtime 12/07/14  Yes Roselee Nova, MD  permethrin (ELIMITE) 5 % cream Apply to entire body from neck down once 01/02/15   Virginia Crews, MD  predniSONE (DELTASONE) 50 MG tablet Take 1 tablet (50 mg total) by mouth daily. 01/02/15   Virginia Crews, MD  simvastatin (ZOCOR) 20 MG tablet TAKE 1 TABLET EVERY DAY 01/02/15   Brayton Mars, MD   Meds Ordered and Administered this Visit  Medications - No  data to display  BP 125/57 mmHg  Pulse 78  Temp(Src) 98 F (36.7 C) (Oral)  Resp 18  Ht 5\' 2"  (1.575 m)  Wt 165 lb (74.844 kg)  BMI 30.17 kg/m2  SpO2 100% No data found.   Physical Exam  Constitutional: She is oriented to person, place, and time. She appears well-developed and well-nourished. No distress.  HENT:  Head: Normocephalic and atraumatic.  Mouth/Throat: Oropharynx is clear and moist.  Eyes: Conjunctivae and EOM are normal. No scleral icterus.  Neck: Neck supple.  Cardiovascular: Normal rate, regular rhythm, normal heart sounds and intact distal pulses.   Pulmonary/Chest: Effort normal and breath sounds normal. No respiratory distress.  Abdominal: Soft. She exhibits no distension. There is no tenderness.  Musculoskeletal: She exhibits no edema or tenderness.  Lymphadenopathy:    She has no cervical adenopathy.  Neurological:  She is alert and oriented to person, place, and time.  Skin: Skin is warm and dry.  Individual small erythematous papules on erythematous base.  Non-contiguous and not grouped.  Lesions found over chest, neck, back, arms, legs, lower abd, and L groin.  Some lesions with white head present, others without.  Psychiatric: She has a normal mood and affect. Her behavior is normal.    ED Course  Procedures (including critical care time)  Labs Review Labs Reviewed - No data to display  Imaging Review No results found.   Visual Acuity Review  Right Eye Distance:   Left Eye Distance:   Bilateral Distance:    Right Eye Near:   Left Eye Near:    Bilateral Near:         MDM   1. Animal scabies    Rash appears to be atypical, more widespread case of scabies.  Likely source is dog that sleeps in bed.  Patient instructed to wash her linens and clothes in hot water. She states had shower tonight and then apply permethrin cream to entire body from neck down to her and her husband. She is to sleep clean bed linens. In the morning she will take another hot shower and change her bed linens and night clothes again.  Prednisone also prescribed for 5 day burst for itching.  Virginia Crews, MD, MPH PGY-2,  [facility] 01/02/2015 2:30 PM   Virginia Crews, MD 01/02/15 [phone]

## 2015-01-02 NOTE — ED Notes (Signed)
Patient noticed small bumps on her groin last PM which have progressed to her ankles, arms, and chest. Patient has not been exposed to any new environmental changes. The bump have a insect bite appearance.

## 2015-01-02 NOTE — Discharge Instructions (Signed)

## 2015-01-03 NOTE — ED Provider Notes (Signed)
CSN: [id number]     Arrival date & time 01/02/15  1158 History   First MD Initiated Contact with Patient 01/02/15 1348     Chief Complaint  Patient presents with  . Rash    generalized over the body   (Consider location/radiation/quality/duration/timing/severity/associated sxs/prior Treatment) HPI  Past Medical History  Diagnosis Date  . Hyperlipidemia   . Obesity   . Osteopenia   . Migraine   . History of shingles   . Basal cell carcinoma     face, back, neck  . Hypertension   . Gout    Past Surgical History  Procedure Laterality Date  . Cholecystectomy    . Rotator cuff repair      lt shoulder  . Basal cell removed    . Tubal ligation    . Cyst removed from wrist    . Wedge resection of ovary  1982  . Abdominal hysterectomy    . Cesarean section N/A     2   Family History  Problem Relation Age of Onset  . Cancer Mother 76    breast  . Heart disease Mother   . Heart disease Father   . Cancer Father   . Diabetes Sister   . Diabetes Maternal Aunt   . Diabetes Maternal Uncle   . Diabetes Maternal Grandfather   . Colon cancer Neg Hx   . Ovarian cancer Neg Hx    Social History  Substance Use Topics  . Smoking status: Never Smoker   . Smokeless tobacco: Never Used  . Alcohol Use: No   OB History    No data available     Review of Systems  Allergies  Tetracyclines & related; Metoprolol; and Alcohol  Home Medications   Prior to Admission medications   Medication Sig Start Date End Date Taking? Authorizing Provider  eszopiclone (LUNESTA) 1 MG TABS tablet Take 1 tablet (1 mg total) by mouth at bedtime as needed for sleep. Take immediately before bedtime 12/07/14  Yes Roselee Nova, MD  permethrin (ELIMITE) 5 % cream Apply to entire body from neck down once 01/02/15   Virginia Crews, MD  predniSONE (DELTASONE) 50 MG tablet Take 1 tablet (50 mg total) by mouth daily. 01/02/15   Virginia Crews, MD  simvastatin (ZOCOR) 20 MG tablet TAKE 1  TABLET EVERY DAY 01/02/15   Brayton Mars, MD   Meds Ordered and Administered this Visit  Medications - No data to display  BP 125/57 mmHg  Pulse 78  Temp(Src) 98 F (36.7 C) (Oral)  Resp 18  Ht 5\' 2"  (1.575 m)  Wt 165 lb (74.844 kg)  BMI 30.17 kg/m2  SpO2 100% No data found.   Physical Exam  ED Course  Procedures (including critical care time)  Labs Review Labs Reviewed - No data to display  Imaging Review No results found.   Visual Acuity Review  Right Eye Distance:   Left Eye Distance:   Bilateral Distance:    Right Eye Near:   Left Eye Near:    Bilateral Near:         MDM   1. Animal scabies    Patient was seen and discussed with Dr. B. Rash thought to be animal scabies. First and went over hygienic habits for patient to use with the patient and her husband to try to prevent further problems and for complete recovery.    Frederich Cha, MD 01/03/15 [phone]

## 2015-01-04 ENCOUNTER — Ambulatory Visit: Payer: Managed Care, Other (non HMO) | Admitting: Family Medicine

## 2015-01-17 ENCOUNTER — Encounter: Payer: Self-pay | Admitting: Family Medicine

## 2015-01-17 ENCOUNTER — Ambulatory Visit (INDEPENDENT_AMBULATORY_CARE_PROVIDER_SITE_OTHER): Payer: Managed Care, Other (non HMO) | Admitting: Family Medicine

## 2015-01-17 VITALS — BP 122/70 | HR 71 | Temp 98.1°F | Resp 14 | Ht 62.0 in | Wt 166.5 lb

## 2015-01-17 DIAGNOSIS — G4700 Insomnia, unspecified: Secondary | ICD-10-CM | POA: Diagnosis not present

## 2015-01-17 MED ORDER — TEMAZEPAM 15 MG PO CAPS: 15.0000 mg | ORAL_CAPSULE | Freq: Every day | ORAL | Status: DC

## 2015-01-17 NOTE — Progress Notes (Signed)
Name: Kristin [name]   MRN: [id number]    DOB: 1960-11-19   Date:01/17/2015       Progress Note  Subjective  Chief Complaint  Chief Complaint  Patient presents with  . Follow-up    1 mo  . Diabetes  . Hypertension    Insomnia Primary symptoms: difficulty falling asleep, frequent awakening, premature morning awakening.  The onset quality is gradual. The problem occurs nightly. The problem is unchanged. Types of beverages you drink: tea. Typical bedtime:  8-10 P.M..  How long after going to bed to you fall asleep: 30-60 minutes.   PMH includes: no depression.  patient has taken Lunesta 1 mg at bedtime, which has not helped with insomnia. In the past, patient has been on Ambien and trazodone which were discontinued.  Past Medical History  Diagnosis Date  . Hyperlipidemia   . Obesity   . Osteopenia   . Migraine   . History of shingles   . Basal cell carcinoma     face, back, neck  . Hypertension   . Gout     Past Surgical History  Procedure Laterality Date  . Cholecystectomy    . Rotator cuff repair      lt shoulder  . Basal cell removed    . Tubal ligation    . Cyst removed from wrist    . Wedge resection of ovary  1982  . Abdominal hysterectomy    . Cesarean section N/A     2    Family History  Problem Relation Age of Onset  . Cancer Mother 35    breast  . Heart disease Mother   . Heart disease Father   . Cancer Father   . Diabetes Sister   . Diabetes Maternal Aunt   . Diabetes Maternal Uncle   . Diabetes Maternal Grandfather   . Colon cancer Neg Hx   . Ovarian cancer Neg Hx     Social History   Social History  . Marital Status: Married    Spouse Name: N/A  . Number of Children: N/A  . Years of Education: N/A   Occupational History  . Not on file.   Social History Main Topics  . Smoking status: Never Smoker   . Smokeless tobacco: Never Used  . Alcohol Use: No  . Drug Use: No  . Sexual Activity: Yes   Other Topics Concern  . Not on  file   Social History Narrative     Current outpatient prescriptions:  .  eszopiclone (LUNESTA) 1 MG TABS tablet, Take 1 tablet (1 mg total) by mouth at bedtime as needed for sleep. Take immediately before bedtime, Disp: 30 tablet, Rfl: 0  Allergies  Allergen Reactions  . Tetracyclines & Related Swelling and Rash    FACE SWELL, RASH ON CHEST  . Metoprolol Diarrhea  . Alcohol Rash    ABDOMINAL CRAMPS     Review of Systems  Respiratory: Negative for cough.   Cardiovascular: Negative for chest pain.  Gastrointestinal: Negative for abdominal pain.  Psychiatric/Behavioral: Negative for depression. The patient has insomnia. The patient is not nervous/anxious.     Objective  Filed Vitals:   01/17/15 1159  BP: 122/70  Pulse: 71  Temp: 98.1 F (36.7 C)  TempSrc: Oral  Resp: 14  Height: 5\' 2"  (1.575 m)  Weight: 166 lb 8 oz (75.524 kg)  SpO2: 98%    Physical Exam  Constitutional: She is oriented to person, place, and time and well-developed, well-nourished, and  in no distress.  Cardiovascular: Normal rate, regular rhythm and normal heart sounds.   No murmur heard. Pulmonary/Chest: Effort normal and breath sounds normal. She has no wheezes.  Neurological: She is alert and oriented to person, place, and time.  Psychiatric: Mood, memory, affect and judgment normal.  Nursing note and vitals reviewed.   Assessment & Plan  1. Cannot sleep DC Lunesta and start on temazepam 15 mg at bedtime. Advised on dependence potential and side effects of temazepam. Follow-up in one month - temazepam (RESTORIL) 15 MG capsule; Take 1 capsule (15 mg total) by mouth at bedtime.  Dispense: 30 capsule; Refill: 0 - Comprehensive Metabolic Panel (CMET)   Kristin [name] [facility] 01/17/2015 12:24 PM

## 2015-01-21 HISTORY — PX: STOMACH SURGERY: SHX791

## 2015-01-23 ENCOUNTER — Encounter: Payer: Self-pay | Admitting: Family Medicine

## 2015-01-23 ENCOUNTER — Ambulatory Visit (INDEPENDENT_AMBULATORY_CARE_PROVIDER_SITE_OTHER): Payer: Managed Care, Other (non HMO) | Admitting: Family Medicine

## 2015-01-23 VITALS — BP 120/74 | HR 94 | Temp 97.8°F | Resp 18 | Ht 62.0 in | Wt 163.7 lb

## 2015-01-23 DIAGNOSIS — J0100 Acute maxillary sinusitis, unspecified: Secondary | ICD-10-CM | POA: Diagnosis not present

## 2015-01-23 MED ORDER — AZITHROMYCIN 250 MG PO TABS: ORAL_TABLET | ORAL | Status: DC

## 2015-01-23 NOTE — Progress Notes (Signed)
Name: Kristin [name]   MRN: [id number]    DOB: Mar 14, 1960   Date:01/23/2015       Progress Note  Subjective  Chief Complaint  Chief Complaint  Patient presents with  . Headache    Sinus Congestion x5 days    Sinusitis This is a new problem. Episode onset: 5. There has been no fever. Associated symptoms include congestion, coughing, ear pain, headaches, sinus pressure and sneezing. Pertinent negatives include no chills. Past treatments include oral decongestants.   Past Medical History  Diagnosis Date  . Hyperlipidemia   . Obesity   . Osteopenia   . Migraine   . History of shingles   . Basal cell carcinoma     face, back, neck  . Hypertension   . Gout     Past Surgical History  Procedure Laterality Date  . Cholecystectomy    . Rotator cuff repair      lt shoulder  . Basal cell removed    . Tubal ligation    . Cyst removed from wrist    . Wedge resection of ovary  1982  . Abdominal hysterectomy    . Cesarean section N/A     2    Family History  Problem Relation Age of Onset  . Cancer Mother 48    breast  . Heart disease Mother   . Heart disease Father   . Cancer Father   . Diabetes Sister   . Diabetes Maternal Aunt   . Diabetes Maternal Uncle   . Diabetes Maternal Grandfather   . Colon cancer Neg Hx   . Ovarian cancer Neg Hx     Social History   Social History  . Marital Status: Married    Spouse Name: N/A  . Number of Children: N/A  . Years of Education: N/A   Occupational History  . Not on file.   Social History Main Topics  . Smoking status: Never Smoker   . Smokeless tobacco: Never Used  . Alcohol Use: No  . Drug Use: No  . Sexual Activity: Yes   Other Topics Concern  . Not on file   Social History Narrative     Current outpatient prescriptions:  .  temazepam (RESTORIL) 15 MG capsule, Take 1 capsule (15 mg total) by mouth at bedtime., Disp: 30 capsule, Rfl: 0  Allergies  Allergen Reactions  . Tetracyclines & Related Swelling  and Rash    FACE SWELL, RASH ON CHEST  . Metoprolol Diarrhea  . Alcohol Rash    ABDOMINAL CRAMPS     Review of Systems  Constitutional: Negative for fever and chills.  HENT: Positive for congestion, ear pain, sinus pressure and sneezing.   Respiratory: Positive for cough.   Neurological: Positive for headaches.    Objective  Filed Vitals:   01/23/15 1426  BP: 120/74  Pulse: 94  Temp: 97.8 F (36.6 C)  TempSrc: Oral  Resp: 18  Height: 5\' 2"  (1.575 m)  Weight: 163 lb 11.2 oz (74.254 kg)  SpO2: 97%    Physical Exam  Constitutional: She is well-developed, well-nourished, and in no distress.  HENT:  Head: Normocephalic and atraumatic.  Right Ear: Tympanic membrane and ear canal normal.  Left Ear: Tympanic membrane and ear canal normal.  Nose: Right sinus exhibits maxillary sinus tenderness. Left sinus exhibits maxillary sinus tenderness.  Mouth/Throat: Oropharynx is clear and moist and mucous membranes are normal.  Cardiovascular: Normal rate, regular rhythm and normal heart sounds.   No murmur heard.  Pulmonary/Chest: Effort normal and breath sounds normal.  Nursing note and vitals reviewed.   Assessment & Plan  1. Acute maxillary sinusitis, recurrence not specified  - azithromycin (ZITHROMAX Z-PAK) 250 MG tablet; 2 tabs po x day 1, then 1 tab po q day x 4 days  Dispense: 6 each; Refill: 0   Dharma Pare [facility] 01/23/2015 2:34 PM

## 2015-02-01 ENCOUNTER — Ambulatory Visit
Admission: RE | Admit: 2015-02-01 | Discharge: 2015-02-01 | Disposition: A | Discharge status: Home / Self Care | Payer: Managed Care, Other (non HMO) | Source: Ambulatory Visit | Attending: Obstetrics and Gynecology | Admitting: Obstetrics and Gynecology

## 2015-02-01 DIAGNOSIS — Z1231 Encounter for screening mammogram for malignant neoplasm of breast: Secondary | ICD-10-CM | POA: Diagnosis present

## 2015-02-19 ENCOUNTER — Ambulatory Visit (INDEPENDENT_AMBULATORY_CARE_PROVIDER_SITE_OTHER): Payer: Managed Care, Other (non HMO) | Admitting: Family Medicine

## 2015-02-19 ENCOUNTER — Encounter: Payer: Self-pay | Admitting: Family Medicine

## 2015-02-19 VITALS — BP 118/74 | HR 74 | Temp 98.8°F | Resp 14 | Ht 62.0 in | Wt 161.0 lb

## 2015-02-19 DIAGNOSIS — G4700 Insomnia, unspecified: Secondary | ICD-10-CM

## 2015-02-19 MED ORDER — TEMAZEPAM 22.5 MG PO CAPS: 22.5000 mg | ORAL_CAPSULE | Freq: Every evening | ORAL | Status: DC | PRN

## 2015-02-19 NOTE — Progress Notes (Signed)
Name: Kristin [name]   MRN: [id number]    DOB: 10-24-1960   Date:02/19/2015       Progress Note  Subjective  Chief Complaint  Chief Complaint  Patient presents with  . Medication Refill    1 month f/u  . Insomnia    Patient state temazepam working better than lunesta but still waking up around 3am    Insomnia Primary symptoms: fragmented sleep (especially lter half of night, where she keeps waking up every 20-30 mins), difficulty falling asleep.  The onset quality is gradual. The symptoms are relieved by medication. Past treatments include medication. The treatment provided moderate relief. Typical bedtime:  8-10 P.M..  How long after going to bed to you fall asleep: 30-60 minutes.   PMH includes: no hypertension, no depression, no family stress or anxiety, no work related stressors.    Past Medical History  Diagnosis Date  . Hyperlipidemia   . Obesity   . Osteopenia   . Migraine   . History of shingles   . Basal cell carcinoma     face, back, neck  . Hypertension   . Gout     Past Surgical History  Procedure Laterality Date  . Cholecystectomy    . Rotator cuff repair      lt shoulder  . Basal cell removed    . Tubal ligation    . Cyst removed from wrist    . Wedge resection of ovary  1982  . Abdominal hysterectomy    . Cesarean section N/A     2    Family History  Problem Relation Age of Onset  . Cancer Mother 46    breast  . Heart disease Mother   . Breast cancer Mother 58  . Heart disease Father   . Cancer Father   . Diabetes Sister   . Diabetes Maternal Aunt   . Diabetes Maternal Uncle   . Diabetes Maternal Grandfather   . Colon cancer Neg Hx   . Ovarian cancer Neg Hx     Social History   Social History  . Marital Status: Married    Spouse Name: N/A  . Number of Children: N/A  . Years of Education: N/A   Occupational History  . Not on file.   Social History Main Topics  . Smoking status: Never Smoker   . Smokeless tobacco: Never Used   . Alcohol Use: No  . Drug Use: No  . Sexual Activity: Yes   Other Topics Concern  . Not on file   Social History Narrative     Current outpatient prescriptions:  .  temazepam (RESTORIL) 15 MG capsule, Take 1 capsule (15 mg total) by mouth at bedtime., Disp: 30 capsule, Rfl: 0  Allergies  Allergen Reactions  . Tetracyclines & Related Swelling and Rash    FACE SWELL, RASH ON CHEST  . Metoprolol Diarrhea  . Alcohol Rash    ABDOMINAL CRAMPS     Review of Systems  Psychiatric/Behavioral: Negative for depression. The patient has insomnia.      Objective  Filed Vitals:   02/19/15 1558  BP: 118/74  Pulse: 74  Temp: 98.8 F (37.1 C)  TempSrc: Oral  Resp: 14  Height: 5\' 2"  (1.575 m)  Weight: 161 lb (73.029 kg)  SpO2: 95%    Physical Exam  Constitutional: She is oriented to person, place, and time and well-developed, well-nourished, and in no distress.  Cardiovascular: Normal rate and regular rhythm.   Pulmonary/Chest: Effort normal and  breath sounds normal.  Neurological: She is alert and oriented to person, place, and time.  Psychiatric: Mood, memory, affect and judgment normal.  Nursing note and vitals reviewed.    Assessment & Plan  1. Cannot sleep We will increase Restoril to 22.5 mg at bedtime to help her stay asleep especially during the latter half of the night. Follow-up in one month. - temazepam (RESTORIL) 22.5 MG capsule; Take 1 capsule (22.5 mg total) by mouth at bedtime as needed for sleep.  Dispense: 30 capsule; Refill: 0   Vanisha Whiten [facility] 02/19/2015 4:18 PM

## 2015-03-21 ENCOUNTER — Ambulatory Visit (INDEPENDENT_AMBULATORY_CARE_PROVIDER_SITE_OTHER): Payer: Managed Care, Other (non HMO) | Admitting: Family Medicine

## 2015-03-21 ENCOUNTER — Encounter: Payer: Self-pay | Admitting: Family Medicine

## 2015-03-21 VITALS — BP 120/68 | HR 76 | Temp 98.5°F | Resp 16 | Ht 62.0 in | Wt 157.5 lb

## 2015-03-21 DIAGNOSIS — G4700 Insomnia, unspecified: Secondary | ICD-10-CM

## 2015-03-21 MED ORDER — TEMAZEPAM 22.5 MG PO CAPS: 22.5000 mg | ORAL_CAPSULE | Freq: Every evening | ORAL | Status: DC | PRN

## 2015-03-21 NOTE — Progress Notes (Signed)
Name: Kristin [name]   MRN: [id number]    DOB: 03-25-1960   Date:03/21/2015       Progress Note  Subjective  Chief Complaint  Chief Complaint  Patient presents with  . Follow-up    1 mo  . Diabetes  . Hypertension  . Medication Refill    temazepam 22.5 mg     Insomnia Primary symptoms: sleep disturbance, difficulty falling asleep, frequent awakening.  The problem occurs nightly. The problem has been gradually improving since onset. Past treatments include medication. Typical bedtime:  10-11 P.M..  How long after going to bed to you fall asleep: 15-30 minutes (Falls asleep within 30 mins after bedtime with taking Restoril.).   PMH includes: no hypertension, no depression.    Past Medical History  Diagnosis Date  . Hyperlipidemia   . Obesity   . Osteopenia   . Migraine   . History of shingles   . Basal cell carcinoma     face, back, neck  . Hypertension   . Gout     Past Surgical History  Procedure Laterality Date  . Cholecystectomy    . Rotator cuff repair      lt shoulder  . Basal cell removed    . Tubal ligation    . Cyst removed from wrist    . Wedge resection of ovary  1982  . Abdominal hysterectomy    . Cesarean section N/A     2    Family History  Problem Relation Age of Onset  . Cancer Mother 79    breast  . Heart disease Mother   . Breast cancer Mother 54  . Heart disease Father   . Cancer Father   . Diabetes Sister   . Diabetes Maternal Aunt   . Diabetes Maternal Uncle   . Diabetes Maternal Grandfather   . Colon cancer Neg Hx   . Ovarian cancer Neg Hx     Social History   Social History  . Marital Status: Married    Spouse Name: N/A  . Number of Children: N/A  . Years of Education: N/A   Occupational History  . Not on file.   Social History Main Topics  . Smoking status: Never Smoker   . Smokeless tobacco: Never Used  . Alcohol Use: No  . Drug Use: No  . Sexual Activity: Yes   Other Topics Concern  . Not on file   Social  History Narrative     Current outpatient prescriptions:  .  temazepam (RESTORIL) 22.5 MG capsule, Take 1 capsule (22.5 mg total) by mouth at bedtime as needed for sleep., Disp: 30 capsule, Rfl: 0  Allergies  Allergen Reactions  . Tetracyclines & Related Swelling and Rash    FACE SWELL, RASH ON CHEST  . Metoprolol Diarrhea  . Alcohol Rash    ABDOMINAL CRAMPS     Review of Systems  Psychiatric/Behavioral: Positive for sleep disturbance. Negative for depression. The patient has insomnia. The patient is not nervous/anxious.     Objective  Filed Vitals:   03/21/15 1555  BP: 120/68  Pulse: 76  Temp: 98.5 F (36.9 C)  TempSrc: Oral  Resp: 16  Height: 5\' 2"  (1.575 m)  Weight: 157 lb 8 oz (71.442 kg)  SpO2: 98%    Physical Exam  Constitutional: She is oriented to person, place, and time and well-developed, well-nourished, and in no distress.  HENT:  Head: Normocephalic and atraumatic.  Cardiovascular: Normal rate and regular rhythm.   Pulmonary/Chest:  Effort normal and breath sounds normal.  Neurological: She is alert and oriented to person, place, and time.  Psychiatric: Mood, memory, affect and judgment normal.  Nursing note and vitals reviewed.     Assessment & Plan  1. Cannot sleep Refill for Restoril provided. - temazepam (RESTORIL) 22.5 MG capsule; Take 1 capsule (22.5 mg total) by mouth at bedtime as needed for sleep.  Dispense: 30 capsule; Refill: 2    Peighton Mehra [facility] 03/21/2015 4:19 PM

## 2015-06-21 ENCOUNTER — Ambulatory Visit (INDEPENDENT_AMBULATORY_CARE_PROVIDER_SITE_OTHER): Payer: Managed Care, Other (non HMO) | Admitting: Family Medicine

## 2015-06-21 ENCOUNTER — Encounter: Payer: Self-pay | Admitting: Family Medicine

## 2015-06-21 VITALS — BP 118/78 | HR 77 | Temp 98.1°F | Resp 18 | Ht 62.0 in | Wt 154.4 lb

## 2015-06-21 DIAGNOSIS — G4700 Insomnia, unspecified: Secondary | ICD-10-CM | POA: Diagnosis not present

## 2015-06-21 MED ORDER — TEMAZEPAM 22.5 MG PO CAPS: 22.5000 mg | ORAL_CAPSULE | Freq: Every evening | ORAL | Status: DC | PRN

## 2015-06-21 NOTE — Progress Notes (Signed)
Name: Kristin [name]   MRN: [id number]    DOB: 1960-12-18   Date:06/21/2015       Progress Note  Subjective  Chief Complaint  Chief Complaint  Patient presents with  . Insomnia    3 month follow up    Insomnia Primary symptoms: sleep disturbance, difficulty falling asleep, frequent awakening.  The problem occurs nightly. The problem has been gradually improving since onset. Past treatments include medication. Typical bedtime:  8-10 P.M..  How long after going to bed to you fall asleep: 15-30 minutes (Falls asleep within 30 mins after bedtime with taking Restoril.).   PMH includes: no hypertension, no depression.     Past Medical History  Diagnosis Date  . Hyperlipidemia   . Obesity   . Osteopenia   . Migraine   . History of shingles   . Basal cell carcinoma     face, back, neck  . Hypertension   . Gout     Past Surgical History  Procedure Laterality Date  . Cholecystectomy    . Rotator cuff repair      lt shoulder  . Basal cell removed    . Tubal ligation    . Cyst removed from wrist    . Wedge resection of ovary  1982  . Abdominal hysterectomy    . Cesarean section N/A     2    Family History  Problem Relation Age of Onset  . Cancer Mother 70    breast  . Heart disease Mother   . Breast cancer Mother 53  . Heart disease Father   . Cancer Father   . Diabetes Sister   . Diabetes Maternal Aunt   . Diabetes Maternal Uncle   . Diabetes Maternal Grandfather   . Colon cancer Neg Hx   . Ovarian cancer Neg Hx     Social History   Social History  . Marital Status: Married    Spouse Name: N/A  . Number of Children: N/A  . Years of Education: N/A   Occupational History  . Not on file.   Social History Main Topics  . Smoking status: Never Smoker   . Smokeless tobacco: Never Used  . Alcohol Use: No  . Drug Use: No  . Sexual Activity: Yes   Other Topics Concern  . Not on file   Social History Narrative     Current outpatient prescriptions:   .  temazepam (RESTORIL) 22.5 MG capsule, Take 1 capsule (22.5 mg total) by mouth at bedtime as needed for sleep., Disp: 30 capsule, Rfl: 2  Allergies  Allergen Reactions  . Tetracyclines & Related Swelling and Rash    FACE SWELL, RASH ON CHEST  . Metoprolol Diarrhea  . Tetracycline Swelling    FACE SWELL, RASH ON CHEST  . Alcohol Rash    ABDOMINAL CRAMPS     Review of Systems  Psychiatric/Behavioral: Positive for sleep disturbance. Negative for depression. The patient has insomnia.     Objective  Filed Vitals:   06/21/15 1552  BP: 118/78  Pulse: 77  Temp: 98.1 F (36.7 C)  Resp: 18  Height: 5\' 2"  (1.575 m)  Weight: 154 lb 7 oz (70.052 kg)  SpO2: 98%    Physical Exam  Constitutional: She is oriented to person, place, and time and well-developed, well-nourished, and in no distress.  HENT:  Head: Normocephalic and atraumatic.  Cardiovascular: Normal rate and regular rhythm.   Murmur heard.  Systolic murmur is present with a grade of  2/6  Pulmonary/Chest: Effort normal and breath sounds normal.  Neurological: She is alert and oriented to person, place, and time.  Psychiatric: Mood, memory, affect and judgment normal.  Nursing note and vitals reviewed.       Assessment & Plan  1. Cannot sleep Symptoms of insomnia responsive to Restoril taken at bedtime as needed. Refills provided - temazepam (RESTORIL) 22.5 MG capsule; Take 1 capsule (22.5 mg total) by mouth at bedtime as needed for sleep.  Dispense: 30 capsule; Refill: 2   [name] [facility] 06/21/2015 3:58 PM

## 2015-08-10 ENCOUNTER — Telehealth: Payer: Self-pay | Admitting: Obstetrics and Gynecology

## 2015-08-10 NOTE — Telephone Encounter (Signed)
Patient called stating she has a yeast infection and would like something called in for her at [facility] in [place]. Patient is aware that Dr Enzo Bi is not in the office on fridays. Thanks

## 2015-08-13 MED ORDER — FLUCONAZOLE 150 MG PO TABS: 150.0000 mg | ORAL_TABLET | Freq: Every day | ORAL | 0 refills | Status: DC

## 2015-08-13 NOTE — Telephone Encounter (Signed)
Pt states she has y/i sx- red, burn, itchy, white thick d/c x 7 day. She has used monistat x5 days. Slight relief. Pt is going to [place] on Wed and will be gone until 8/8. Will erx 2 diflucan. Pt aware if sx no better after med she will need to be seen.

## 2015-08-13 NOTE — Telephone Encounter (Signed)
Pt leaving for [employer], she called back to see if Dr Tennis Must got the msg about yeast inf. She has tried OTC medications and it's not working.

## 2015-09-25 ENCOUNTER — Encounter: Payer: Self-pay | Admitting: Family Medicine

## 2015-09-25 ENCOUNTER — Ambulatory Visit (INDEPENDENT_AMBULATORY_CARE_PROVIDER_SITE_OTHER): Payer: Managed Care, Other (non HMO) | Admitting: Family Medicine

## 2015-09-25 VITALS — BP 118/76 | HR 78 | Temp 98.2°F | Resp 18 | Ht 62.0 in | Wt 158.4 lb

## 2015-09-25 DIAGNOSIS — R12 Heartburn: Secondary | ICD-10-CM

## 2015-09-25 DIAGNOSIS — G4700 Insomnia, unspecified: Secondary | ICD-10-CM

## 2015-09-25 MED ORDER — TEMAZEPAM 22.5 MG PO CAPS: 22.5000 mg | ORAL_CAPSULE | Freq: Every evening | ORAL | 2 refills | Status: DC | PRN

## 2015-09-25 MED ORDER — OMEPRAZOLE 40 MG PO CPDR: 40.0000 mg | DELAYED_RELEASE_CAPSULE | Freq: Every day | ORAL | 0 refills | Status: DC

## 2015-09-25 NOTE — Progress Notes (Signed)
Name: Kristin [name]   MRN: [id number]    DOB: March 19, 1960   Date:09/25/2015       Progress Note  Subjective  Chief Complaint  Chief Complaint  Patient presents with  . Insomnia    pt here for 3 month follow up    Insomnia  Primary symptoms: sleep disturbance, difficulty falling asleep, frequent awakening.  The problem occurs nightly. The problem is unchanged. Past treatments include medication. Typical bedtime:  8-10 P.M..  How long after going to bed to you fall asleep: 15-30 minutes (Falls asleep within 30 mins after bedtime with taking Restoril.).   PMH includes: no hypertension, no depression.  Heartburn  She complains of belching and heartburn. She reports no abdominal pain, no coughing, no dysphagia or no sore throat. This is a recurrent problem. Episode onset: 4 weeks ago. The problem occurs frequently. The problem has been gradually worsening. The heartburn is located in the substernum. The symptoms are aggravated by certain foods (Chocolate Protein shake.). She has tried a PPI (Has tried OTC Omeprazole, whic helps relieve her symptoms.) for the symptoms.    Past Medical History:  Diagnosis Date  . Basal cell carcinoma    face, back, neck  . Gout   . History of shingles   . Hyperlipidemia   . Hypertension   . Migraine   . Obesity   . Osteopenia     Past Surgical History:  Procedure Laterality Date  . ABDOMINAL HYSTERECTOMY    . basal cell removed    . CESAREAN SECTION N/A    2  . CHOLECYSTECTOMY    . cyst removed from wrist    . ROTATOR CUFF REPAIR     lt shoulder  . TUBAL LIGATION    . wedge resection of ovary  1982    Family History  Problem Relation Age of Onset  . Cancer Mother 49    breast  . Heart disease Mother   . Breast cancer Mother 84  . Heart disease Father   . Cancer Father   . Diabetes Sister   . Diabetes Maternal Aunt   . Diabetes Maternal Uncle   . Diabetes Maternal Grandfather   . Colon cancer Neg Hx   . Ovarian cancer Neg Hx      Social History   Social History  . Marital status: Married    Spouse name: N/A  . Number of children: N/A  . Years of education: N/A   Occupational History  . Not on file.   Social History Main Topics  . Smoking status: Never Smoker  . Smokeless tobacco: Never Used  . Alcohol use No  . Drug use: No  . Sexual activity: Yes   Other Topics Concern  . Not on file   Social History Narrative  . No narrative on file     Current Outpatient Prescriptions:  .  temazepam (RESTORIL) 22.5 MG capsule, Take 1 capsule (22.5 mg total) by mouth at bedtime as needed for sleep., Disp: 30 capsule, Rfl: 2  Allergies  Allergen Reactions  . Tetracyclines & Related Swelling and Rash    FACE SWELL, RASH ON CHEST  . Metoprolol Diarrhea  . Tetracycline Swelling    FACE SWELL, RASH ON CHEST  . Alcohol Rash    ABDOMINAL CRAMPS     Review of Systems  HENT: Negative for sore throat.   Respiratory: Negative for cough.   Gastrointestinal: Positive for heartburn. Negative for abdominal pain and dysphagia.  Psychiatric/Behavioral: Positive for sleep  disturbance. Negative for depression. The patient has insomnia.     Objective  Vitals:   09/25/15 1608  BP: 118/76  Pulse: 78  Resp: 18  Temp: 98.2 F (36.8 C)  SpO2: 97%  Weight: 158 lb 7 oz (71.9 kg)  Height: 5\' 2"  (1.575 m)    Physical Exam  Constitutional: She is oriented to person, place, and time and well-developed, well-nourished, and in no distress.  HENT:  Head: Normocephalic and atraumatic.  Cardiovascular: Normal rate and regular rhythm.   No murmur heard. Pulmonary/Chest: Effort normal and breath sounds normal. She has no wheezes.  Abdominal: Soft. Bowel sounds are normal. There is no tenderness.  Musculoskeletal:       Right ankle: She exhibits no swelling.       Left ankle: She exhibits no swelling.  Neurological: She is alert and oriented to person, place, and time.  Psychiatric: Mood, memory, affect and judgment  normal.  Nursing note and vitals reviewed.   Assessment & Plan  1. Cannot sleep Stable, continue on Restoril as prescribed - temazepam (RESTORIL) 22.5 MG capsule; Take 1 capsule (22.5 mg total) by mouth at bedtime as needed for sleep.  Dispense: 30 capsule; Refill: 2  2. Heartburn Start on Prilosec40 mg daily, she has been taking OTC omeprazole 20 mg, having to take 2 pills. - omeprazole (PRILOSEC) 40 MG capsule; Take 1 capsule (40 mg total) by mouth daily.  Dispense: 90 capsule; Refill: 0   Ivonne Freeburg [facility] 09/25/2015 4:33 PM

## 2015-10-15 NOTE — Progress Notes (Deleted)
ANNUAL PREVENTATIVE CARE GYN  ENCOUNTER NOTE  Subjective:       Kristin [name] is a 55 y.o. No obstetric history on file. female here for a routine annual gynecologic exam.  Current complaints: 1.      Gynecologic History No LMP recorded. Patient has had a hysterectomy. Contraception: status post hysterectomy Last Pap: no further paps needed. Results were: normal Last mammogram: 01/2015 birad 1. Results were: normal  Obstetric History OB History  No data available    Past Medical History:  Diagnosis Date  . Basal cell carcinoma    face, back, neck  . Gout   . History of shingles   . Hyperlipidemia   . Hypertension   . Migraine   . Obesity   . Osteopenia     Past Surgical History:  Procedure Laterality Date  . ABDOMINAL HYSTERECTOMY    . basal cell removed    . CESAREAN SECTION N/A    2  . CHOLECYSTECTOMY    . cyst removed from wrist    . ROTATOR CUFF REPAIR     lt shoulder  . TUBAL LIGATION    . wedge resection of ovary  1982    Current Outpatient Prescriptions on File Prior to Visit  Medication Sig Dispense Refill  . omeprazole (PRILOSEC) 40 MG capsule Take 1 capsule (40 mg total) by mouth daily. 90 capsule 0  . temazepam (RESTORIL) 22.5 MG capsule Take 1 capsule (22.5 mg total) by mouth at bedtime as needed for sleep. 30 capsule 2   No current facility-administered medications on file prior to visit.     Allergies  Allergen Reactions  . Tetracyclines & Related Swelling and Rash    FACE SWELL, RASH ON CHEST  . Metoprolol Diarrhea  . Tetracycline Swelling    FACE SWELL, RASH ON CHEST  . Alcohol Rash    ABDOMINAL CRAMPS    Social History   Social History  . Marital status: Married    Spouse name: N/A  . Number of children: N/A  . Years of education: N/A   Occupational History  . Not on file.   Social History Main Topics  . Smoking status: Never Smoker  . Smokeless tobacco: Never Used  . Alcohol use No  . Drug use: No  . Sexual  activity: Yes   Other Topics Concern  . Not on file   Social History Narrative  . No narrative on file    Family History  Problem Relation Age of Onset  . Cancer Mother 60    breast  . Heart disease Mother   . Breast cancer Mother 70  . Heart disease Father   . Cancer Father   . Diabetes Sister   . Diabetes Maternal Aunt   . Diabetes Maternal Uncle   . Diabetes Maternal Grandfather   . Colon cancer Neg Hx   . Ovarian cancer Neg Hx     The following portions of the patient's history were reviewed and updated as appropriate: allergies, current medications, past family history, past medical history, past social history, past surgical history and problem list.  Review of Systems ROS Review of Systems - General ROS: negative for - chills, fatigue, fever, hot flashes, night sweats, weight gain or weight loss Psychological ROS: negative for - anxiety, decreased libido, depression, mood swings, physical abuse or sexual abuse Ophthalmic ROS: negative for - blurry vision, eye pain or loss of vision ENT ROS: negative for - headaches, hearing change, visual changes or vocal  changes Allergy and Immunology ROS: negative for - hives, itchy/watery eyes or seasonal allergies Hematological and Lymphatic ROS: negative for - bleeding problems, bruising, swollen lymph nodes or weight loss Endocrine ROS: negative for - galactorrhea, hair pattern changes, hot flashes, malaise/lethargy, mood swings, palpitations, polydipsia/polyuria, skin changes, temperature intolerance or unexpected weight changes Breast ROS: negative for - new or changing breast lumps or nipple discharge Respiratory ROS: negative for - cough or shortness of breath Cardiovascular ROS: negative for - chest pain, irregular heartbeat, palpitations or shortness of breath Gastrointestinal ROS: no abdominal pain, change in bowel habits, or black or bloody stools Genito-Urinary ROS: no dysuria, trouble voiding, or  hematuria Musculoskeletal ROS: negative for - joint pain or joint stiffness Neurological ROS: negative for - bowel and bladder control changes Dermatological ROS: negative for rash and skin lesion changes   Objective:   There were no vitals taken for this visit. CONSTITUTIONAL: Well-developed, well-nourished female in no acute distress.  PSYCHIATRIC: Normal mood and affect. Normal behavior. Normal judgment and thought content. [place]: Alert and oriented to person, place, and time. Normal muscle tone coordination. No cranial nerve deficit noted. HENT:  Normocephalic, atraumatic, External right and left ear normal. Oropharynx is clear and moist EYES: Conjunctivae and EOM are normal. Pupils are equal, round, and reactive to light. No scleral icterus.  NECK: Normal range of motion, supple, no masses.  Normal thyroid.  SKIN: Skin is warm and dry. No rash noted. Not diaphoretic. No erythema. No pallor. CARDIOVASCULAR: Normal heart rate noted, regular rhythm, no murmur. RESPIRATORY: Clear to auscultation bilaterally. Effort and breath sounds normal, no problems with respiration noted. BREASTS: Symmetric in size. No masses, skin changes, nipple drainage, or lymphadenopathy. ABDOMEN: Soft, normal bowel sounds, no distention noted.  No tenderness, rebound or guarding.  BLADDER: Normal PELVIC:  External Genitalia: Normal  BUS: Normal  Vagina: Normal  Cervix: Normal  Uterus: Normal  Adnexa: Normal  RV: {Blank multiple:19196::"External Exam NormaI","No Rectal Masses","Normal Sphincter tone"}  MUSCULOSKELETAL: Normal range of motion. No tenderness.  No cyanosis, clubbing, or edema.  2+ distal pulses. LYMPHATIC: No Axillary, Supraclavicular, or Inguinal Adenopathy.    Assessment:   Annual gynecologic examination 55 y.o. Contraception: status post hysterectomy [id number] Problem List Items Addressed This Visit    Increased BMI   Menopause    Other Visit Diagnoses    Well woman exam    -   Primary   Encounter for screening mammogram for breast cancer       Vaginal atrophy          Plan:  Pap: Not needed Mammogram: utd Stool Guaiac Testing:  Ordered Labs: thru pcp Routine preventative health maintenance measures emphasized: {Blank multiple:19196::"Exercise/Diet/Weight control","Tobacco Warnings","Alcohol/Substance use risks","Stress Management","Peer Pressure Issues","Safe Sex"} *** Return to [place], [place]

## 2015-10-17 ENCOUNTER — Encounter: Payer: Managed Care, Other (non HMO) | Admitting: Obstetrics and Gynecology

## 2015-11-08 ENCOUNTER — Encounter: Payer: Self-pay | Admitting: Emergency Medicine

## 2015-11-08 ENCOUNTER — Ambulatory Visit
Admission: EM | Admit: 2015-11-08 | Discharge: 2015-11-08 | Disposition: A | Discharge status: Home / Self Care | Payer: Managed Care, Other (non HMO) | Attending: Family Medicine | Admitting: Family Medicine

## 2015-11-08 DIAGNOSIS — M26622 Arthralgia of left temporomandibular joint: Secondary | ICD-10-CM | POA: Diagnosis not present

## 2015-11-08 MED ORDER — CYCLOBENZAPRINE HCL 10 MG PO TABS: 10.0000 mg | ORAL_TABLET | Freq: Every evening | ORAL | 0 refills | Status: DC | PRN

## 2015-11-08 MED ORDER — MELOXICAM 15 MG PO TABS: 15.0000 mg | ORAL_TABLET | Freq: Every day | ORAL | 0 refills | Status: DC | PRN

## 2015-11-08 NOTE — ED Provider Notes (Signed)
[facility] ____________________________________________  Time seen: Approximately 5:02 PM  I have reviewed the triage vital signs and the nursing notes.   HISTORY  Chief Complaint Otalgia (left ear)   HPI Kristin [name] is a 55 y.o. female presents for complaint of left ear pain started yesterday. Patient describes the pain as mild at this time but reports more so moderate last night. Patient denies any recent cough, congestion or fevers. Denies any drainage from the ear, hearing deficits or tinnitus. Patient denies pain radiation. Patient reports she has been stressed recently as her mother was visiting and staying with her. Also reports that she does grind her teeth at night and believes that she was grinding her teeth more when her mother was visiting. Denies any fall or trauma. Denies any other complaints. Denies any recent sickness, medication changes, dizziness, headache, neck pain or other complaints.  Keith Rake, MD PCP   Past Medical History:  Diagnosis Date  . Basal cell carcinoma    face, back, neck  . Gout   . History of shingles   . Hyperlipidemia   . Hypertension   . Migraine   . Obesity   . Osteopenia     Patient Active Problem List   Diagnosis Date Noted  . Sinusitis, acute maxillary 01/23/2015  . Pain in right elbow 12/07/2014  . Dermatitis of lower extremity 08/09/2014  . Diabetes mellitus type 2, controlled ([place]) 08/09/2014  . Hypertension 07/17/2014  . Cannot sleep 07/17/2014  . Heart murmur previously undiagnosed 07/17/2014  . Increased BMI 07/05/2014  . Menopause 07/05/2014  . Hypertension due to drug 07/05/2014  . Basal cell carcinoma of cheek 08/30/2012    Past Surgical History:  Procedure Laterality Date  . ABDOMINAL HYSTERECTOMY    . basal cell removed    . CESAREAN SECTION N/A    2  . CHOLECYSTECTOMY    . cyst removed from wrist    . ROTATOR CUFF REPAIR     lt shoulder  . TUBAL LIGATION    . wedge resection of  ovary  1982    Current Outpatient Rx  . Order #: [id number] Class: Normal  . Order #: [id number] Class: Normal  . Order #: [id number] Class: Normal  . Order #: [id number] Class: Print    No current facility-administered medications for this encounter.   Current Outpatient Prescriptions:  .  cyclobenzaprine (FLEXERIL) 10 MG tablet, Take 1 tablet (10 mg total) by mouth at bedtime as needed for muscle spasms., Disp: 15 tablet, Rfl: 0 .  meloxicam (MOBIC) 15 MG tablet, Take 1 tablet (15 mg total) by mouth daily as needed for pain., Disp: 10 tablet, Rfl: 0 .  omeprazole (PRILOSEC) 40 MG capsule, Take 1 capsule (40 mg total) by mouth daily., Disp: 90 capsule, Rfl: 0 .  temazepam (RESTORIL) 22.5 MG capsule, Take 1 capsule (22.5 mg total) by mouth at bedtime as needed for sleep., Disp: 30 capsule, Rfl: 2  Allergies Tetracyclines & related; Metoprolol; Tetracycline; and Alcohol  Family History  Problem Relation Age of Onset  . Cancer Mother 64    breast  . Heart disease Mother   . Breast cancer Mother 12  . Heart disease Father   . Cancer Father   . Diabetes Sister   . Diabetes Maternal Aunt   . Diabetes Maternal Uncle   . Diabetes Maternal Grandfather   . Colon cancer Neg Hx   . Ovarian cancer Neg Hx     Social History Social History  Substance Use Topics  .  Smoking status: Never Smoker  . Smokeless tobacco: Never Used  . Alcohol use No    Review of Systems Constitutional: No fever/chills Eyes: No visual changes. ENT: No sore throat.As above. Cardiovascular: Denies chest pain. Respiratory: Denies shortness of breath. Gastrointestinal: No abdominal pain.  No nausea, no vomiting.  No diarrhea.  No constipation. Genitourinary: Negative for dysuria. Musculoskeletal: Negative for back pain. Skin: Negative for rash. Neurological: Negative for headaches, focal weakness or numbness.  10-point ROS otherwise negative.  ____________________________________________   PHYSICAL  EXAM:  VITAL SIGNS: ED Triage Vitals  Enc Vitals Group     BP 11/08/15 1657 (!) 154/70     Pulse Rate 11/08/15 1657 63     Resp 11/08/15 1657 16     Temp 11/08/15 1657 97.1 F (36.2 C)     Temp Source 11/08/15 1657 Tympanic     SpO2 11/08/15 1657 100 %     Weight 11/08/15 1658 157 lb (71.2 kg)     Height 11/08/15 1658 5\' 2"  (1.575 m)     Head Circumference --      Peak Flow --      Pain Score 11/08/15 1659 4     Pain Loc --      Pain Edu? --      Excl. in [place]? --     Constitutional: Alert and oriented. Well appearing and in no acute distress. Eyes: Conjunctivae are normal. PERRL. EOMI. ENT      Head: Normocephalic and atraumatic. Right TMJ nontender. Left TMJ mild to moderate tenderness to direct palpation, full range of motion, no trismus, no erythema.      Nose: No congestion/rhinnorhea.      Ears: Nontender, no erythema, no exudate, normal TMs bilaterally.      Mouth/Throat: Mucous membranes are moist. Oropharynx non-erythematous. Neck: No stridor. Supple without meningismus.  Hematological/Lymphatic/Immunilogical: No cervical lymphadenopathy. Cardiovascular: Normal rate, regular rhythm. Grossly normal heart sounds.  Good peripheral circulation. Respiratory: Normal respiratory effort without tachypnea nor retractions. Breath sounds are clear and equal bilaterally. No wheezes/rales/rhonchi. Gastrointestinal: Soft and nontender.  Musculoskeletal:  Ambulatory with steady gait. No midline cervical, thoracic or lumbar tenderness to palpation.  Neurologic:  Normal speech and language. No gross focal neurologic deficits are appreciated. Speech is normal. No gait instability.  Skin:  Skin is warm, dry and intact. No rash noted. Psychiatric: Mood and affect are normal. Speech and behavior are normal. Patient exhibits appropriate insight and judgment   ___________________________________________   LABS (all labs ordered are listed, but only abnormal results are displayed)  Labs  Reviewed - No data to display ____________________________________________  PROCEDURES Procedures     INITIAL IMPRESSION / ASSESSMENT AND PLAN / ED COURSE  Pertinent labs & imaging results that were available during my care of the patient were reviewed by me and considered in my medical decision making (see chart for details).  Well-appearing patient. No acute distress. Presents with complaints of left ear pain since yesterday. Left ear appears well. However patient with noted left TMJ tenderness, full range of motion present, no trismus. Patient does grind her teeth at night and reports she has previously talked to the dentist about obtaining a mouth guard. Discussed with patient follow back up with dentist. Avoid aggravating stressing factors. Will treat patient with oral Mobic and when necessary Flexeril at night as needed.Discussed indication, risks and benefits of medications with patient.  Discussed follow up with Primary care physician this week. Discussed follow up and return parameters including no  resolution or any worsening concerns. Patient verbalized understanding and agreed to plan.   ____________________________________________   FINAL CLINICAL IMPRESSION(S) / ED DIAGNOSES  Final diagnoses:  Arthralgia of left temporomandibular joint     Discharge Medication List as of 11/08/2015  5:14 PM    START taking these medications   Details  cyclobenzaprine (FLEXERIL) 10 MG tablet Take 1 tablet (10 mg total) by mouth at bedtime as needed for muscle spasms., Starting Thu 11/08/2015, Normal    meloxicam (MOBIC) 15 MG tablet Take 1 tablet (15 mg total) by mouth daily as needed for pain., Starting Thu 11/08/2015, Normal        Note: This dictation was prepared with Dragon dictation along with smaller phrase technology. Any transcriptional errors that result from this process are unintentional.    Clinical Course      Marylene Land, NP 11/08/15 1756

## 2015-11-08 NOTE — ED Triage Notes (Signed)
Patient c/o left ear pain that started yesterday.

## 2015-11-08 NOTE — Discharge Instructions (Signed)
Take medication as prescribed. Rest. Drink plenty of fluids.  ° °Follow up with your primary care physician this week as needed. Return to Urgent care for new or worsening concerns.  ° °

## 2015-11-11 ENCOUNTER — Telehealth: Payer: Self-pay | Admitting: *Deleted

## 2015-11-22 ENCOUNTER — Ambulatory Visit: Payer: Managed Care, Other (non HMO) | Admitting: Family Medicine

## 2015-12-11 ENCOUNTER — Encounter: Payer: Self-pay | Admitting: Family Medicine

## 2015-12-11 ENCOUNTER — Ambulatory Visit (INDEPENDENT_AMBULATORY_CARE_PROVIDER_SITE_OTHER): Payer: Managed Care, Other (non HMO) | Admitting: Family Medicine

## 2015-12-11 VITALS — BP 130/76 | HR 65 | Resp 14 | Ht 64.0 in | Wt 162.5 lb

## 2015-12-11 DIAGNOSIS — I10 Essential (primary) hypertension: Secondary | ICD-10-CM

## 2015-12-11 DIAGNOSIS — M79674 Pain in right toe(s): Secondary | ICD-10-CM | POA: Insufficient documentation

## 2015-12-11 DIAGNOSIS — E119 Type 2 diabetes mellitus without complications: Secondary | ICD-10-CM

## 2015-12-11 DIAGNOSIS — E785 Hyperlipidemia, unspecified: Secondary | ICD-10-CM

## 2015-12-11 DIAGNOSIS — F419 Anxiety disorder, unspecified: Secondary | ICD-10-CM

## 2015-12-11 DIAGNOSIS — F5101 Primary insomnia: Secondary | ICD-10-CM

## 2015-12-11 MED ORDER — ZOLPIDEM TARTRATE 5 MG PO TABS: 5.0000 mg | ORAL_TABLET | Freq: Every evening | ORAL | 1 refills | Status: DC | PRN

## 2015-12-11 NOTE — Progress Notes (Signed)
Pre visit review using our clinic review tool, if applicable. No additional management support is needed unless otherwise documented below in the visit note. 

## 2015-12-11 NOTE — Patient Instructions (Signed)
Use the ambien as directed.  We will call regarding your lab results.  Follow up in 6 months.  Take care  Dr. Lacinda Axon   Health Maintenance, Female Introduction Adopting a healthy lifestyle and getting preventive care can go a long way to promote health and wellness. Talk with your health care provider about what schedule of regular examinations is right for you. This is a good chance for you to check in with your provider about disease prevention and staying healthy. In between checkups, there are plenty of things you can do on your own. Experts have done a lot of research about which lifestyle changes and preventive measures are most likely to keep you healthy. Ask your health care provider for more information. Weight and diet Eat a healthy diet  Be sure to include plenty of vegetables, fruits, low-fat dairy products, and lean protein.  Do not eat a lot of foods high in solid fats, added sugars, or salt.  Get regular exercise. This is one of the most important things you can do for your health.  Most adults should exercise for at least 150 minutes each week. The exercise should increase your heart rate and make you sweat (moderate-intensity exercise).  Most adults should also do strengthening exercises at least twice a week. This is in addition to the moderate-intensity exercise. Maintain a healthy weight  Body mass index (BMI) is a measurement that can be used to identify possible weight problems. It estimates body fat based on height and weight. Your health care provider can help determine your BMI and help you achieve or maintain a healthy weight.  For females 5 years of age and older:  A BMI below 18.5 is considered underweight.  A BMI of 18.5 to 24.9 is normal.  A BMI of 25 to 29.9 is considered overweight.  A BMI of 30 and above is considered obese. Watch levels of cholesterol and blood lipids  You should start having your blood tested for lipids and cholesterol at 56  years of age, then have this test every 5 years.  You may need to have your cholesterol levels checked more often if:  Your lipid or cholesterol levels are high.  You are older than 56 years of age.  You are at high risk for heart disease. Cancer screening Lung Cancer  Lung cancer screening is recommended for adults 53-57 years old who are at high risk for lung cancer because of a history of smoking.  A yearly low-dose CT scan of the lungs is recommended for people who:  Currently smoke.  Have quit within the past 15 years.  Have at least a 30-pack-year history of smoking. A pack year is smoking an average of one pack of cigarettes a day for 1 year.  Yearly screening should continue until it has been 15 years since you quit.  Yearly screening should stop if you develop a health problem that would prevent you from having lung cancer treatment. Breast Cancer  Practice breast self-awareness. This means understanding how your breasts normally appear and feel.  It also means doing regular breast self-exams. Let your health care provider know about any changes, no matter how small.  If you are in your 20s or 30s, you should have a clinical breast exam (CBE) by a health care provider every 1-3 years as part of a regular health exam.  If you are 26 or older, have a CBE every year. Also consider having a breast X-ray (mammogram) every year.  If  you have a family history of breast cancer, talk to your health care provider about genetic screening.  If you are at high risk for breast cancer, talk to your health care provider about having an MRI and a mammogram every year.  Breast cancer gene (BRCA) assessment is recommended for women who have family members with BRCA-related cancers. BRCA-related cancers include:  Breast.  Ovarian.  Tubal.  Peritoneal cancers.  Results of the assessment will determine the need for genetic counseling and BRCA1 and BRCA2 testing. Cervical Cancer   Your health care provider may recommend that you be screened regularly for cancer of the pelvic organs (ovaries, uterus, and vagina). This screening involves a pelvic examination, including checking for microscopic changes to the surface of your cervix (Pap test). You may be encouraged to have this screening done every 3 years, beginning at age 35.  For women ages 17-65, health care providers may recommend pelvic exams and Pap testing every 3 years, or they may recommend the Pap and pelvic exam, combined with testing for human papilloma virus (HPV), every 5 years. Some types of HPV increase your risk of cervical cancer. Testing for HPV may also be done on women of any age with unclear Pap test results.  Other health care providers may not recommend any screening for nonpregnant women who are considered low risk for pelvic cancer and who do not have symptoms. Ask your health care provider if a screening pelvic exam is right for you.  If you have had past treatment for cervical cancer or a condition that could lead to cancer, you need Pap tests and screening for cancer for at least 20 years after your treatment. If Pap tests have been discontinued, your risk factors (such as having a new sexual partner) need to be reassessed to determine if screening should resume. Some women have medical problems that increase the chance of getting cervical cancer. In these cases, your health care provider may recommend more frequent screening and Pap tests. Colorectal Cancer  This type of cancer can be detected and often prevented.  Routine colorectal cancer screening usually begins at 56 years of age and continues through 56 years of age.  Your health care provider may recommend screening at an earlier age if you have risk factors for colon cancer.  Your health care provider may also recommend using home test kits to check for hidden blood in the stool.  A small camera at the end of a tube can be used to examine  your colon directly (sigmoidoscopy or colonoscopy). This is done to check for the earliest forms of colorectal cancer.  Routine screening usually begins at age 34.  Direct examination of the colon should be repeated every 5-10 years through 56 years of age. However, you may need to be screened more often if early forms of precancerous polyps or small growths are found. Skin Cancer  Check your skin from head to toe regularly.  Tell your health care provider about any new moles or changes in moles, especially if there is a change in a mole's shape or color.  Also tell your health care provider if you have a mole that is larger than the size of a pencil eraser.  Always use sunscreen. Apply sunscreen liberally and repeatedly throughout the day.  Protect yourself by wearing long sleeves, pants, a wide-brimmed hat, and sunglasses whenever you are outside. Heart disease, diabetes, and high blood pressure  High blood pressure causes heart disease and increases the risk of  stroke. High blood pressure is more likely to develop in:  People who have blood pressure in the high end of the normal range (130-139/85-89 mm Hg).  People who are overweight or obese.  People who are African American.  If you are 37-59 years of age, have your blood pressure checked every 3-5 years. If you are 88 years of age or older, have your blood pressure checked every year. You should have your blood pressure measured twice-once when you are at a hospital or clinic, and once when you are not at a hospital or clinic. Record the average of the two measurements. To check your blood pressure when you are not at a hospital or clinic, you can use:  An automated blood pressure machine at a pharmacy.  A home blood pressure monitor.  If you are between 62 years and 42 years old, ask your health care provider if you should take aspirin to prevent strokes.  Have regular diabetes screenings. This involves taking a blood sample  to check your fasting blood sugar level.  If you are at a normal weight and have a low risk for diabetes, have this test once every three years after 56 years of age.  If you are overweight and have a high risk for diabetes, consider being tested at a younger age or more often. Preventing infection Hepatitis B  If you have a higher risk for hepatitis B, you should be screened for this virus. You are considered at high risk for hepatitis B if:  You were born in a country where hepatitis B is common. Ask your health care provider which countries are considered high risk.  Your parents were born in a high-risk country, and you have not been immunized against hepatitis B (hepatitis B vaccine).  You have HIV or AIDS.  You use needles to inject street drugs.  You live with someone who has hepatitis B.  You have had sex with someone who has hepatitis B.  You get hemodialysis treatment.  You take certain medicines for conditions, including cancer, organ transplantation, and autoimmune conditions. Hepatitis C  Blood testing is recommended for:  Everyone born from [birth year] through 1966.  Anyone with known risk factors for hepatitis C. Sexually transmitted infections (STIs)  You should be screened for sexually transmitted infections (STIs) including gonorrhea and chlamydia if:  You are sexually active and are younger than 56 years of age.  You are older than 56 years of age and your health care provider tells you that you are at risk for this type of infection.  Your sexual activity has changed since you were last screened and you are at an increased risk for chlamydia or gonorrhea. Ask your health care provider if you are at risk.  If you do not have HIV, but are at risk, it may be recommended that you take a prescription medicine daily to prevent HIV infection. This is called pre-exposure prophylaxis (PrEP). You are considered at risk if:  You are sexually active and do not regularly  use condoms or know the HIV status of your partner(s).  You take drugs by injection.  You are sexually active with a partner who has HIV. Talk with your health care provider about whether you are at high risk of being infected with HIV. If you choose to begin PrEP, you should first be tested for HIV. You should then be tested every 3 months for as long as you are taking PrEP. Pregnancy  If you are premenopausal  and you may become pregnant, ask your health care provider about preconception counseling.  If you may become pregnant, take 400 to 800 micrograms (mcg) of folic acid every day.  If you want to prevent pregnancy, talk to your health care provider about birth control (contraception). Osteoporosis and menopause  Osteoporosis is a disease in which the bones lose minerals and strength with aging. This can result in serious bone fractures. Your risk for osteoporosis can be identified using a bone density scan.  If you are 70 years of age or older, or if you are at risk for osteoporosis and fractures, ask your health care provider if you should be screened.  Ask your health care provider whether you should take a calcium or vitamin D supplement to lower your risk for osteoporosis.  Menopause may have certain physical symptoms and risks.  Hormone replacement therapy may reduce some of these symptoms and risks. Talk to your health care provider about whether hormone replacement therapy is right for you. Follow these instructions at home:  Schedule regular health, dental, and eye exams.  Stay current with your immunizations.  Do not use any tobacco products including cigarettes, chewing tobacco, or electronic cigarettes.  If you are pregnant, do not drink alcohol.  If you are breastfeeding, limit how much and how often you drink alcohol.  Limit alcohol intake to no more than 1 drink per day for nonpregnant women. One drink equals 12 ounces of beer, 5 ounces of wine, or 1 ounces of  hard liquor.  Do not use street drugs.  Do not share needles.  Ask your health care provider for help if you need support or information about quitting drugs.  Tell your health care provider if you often feel depressed.  Tell your health care provider if you have ever been abused or do not feel safe at home. This information is not intended to replace advice given to you by your health care provider. Make sure you discuss any questions you have with your health care provider. Document Released: 07/22/2010 Document Revised: 06/14/2015 Document Reviewed: 10/10/2014  2017 [employer]

## 2015-12-12 DIAGNOSIS — E1169 Type 2 diabetes mellitus with other specified complication: Secondary | ICD-10-CM | POA: Insufficient documentation

## 2015-12-12 DIAGNOSIS — F419 Anxiety disorder, unspecified: Secondary | ICD-10-CM | POA: Insufficient documentation

## 2015-12-12 DIAGNOSIS — M79674 Pain in right toe(s): Secondary | ICD-10-CM | POA: Insufficient documentation

## 2015-12-12 DIAGNOSIS — E785 Hyperlipidemia, unspecified: Secondary | ICD-10-CM | POA: Insufficient documentation

## 2015-12-12 HISTORY — DX: Anxiety disorder, unspecified: F41.9

## 2015-12-12 NOTE — Progress Notes (Signed)
Subjective:  Patient ID: Kristin [name], female    DOB: 1960-03-26  Age: 55 y.o. MRN: [id number]  CC: Establish care, Insomnia, ? Gout, Anxiety  HPI Kristin [name] is a 55 y.o. female presents to the clinic today to establish care. Issues are below.  DM-2  Last A1C Per patient was 5.9.  She states that her blood sugars have been well controlled on no medication.   HTN  No meds at this time.  BP slightly elevated today but at goal (new guidelines - 130/80).  HLD  Unsure of control.  No meds at this time.  Insomnia  Currently on Restoril.  She states that she's not sleeping well despite medication.  She would like to discuss other options today.  She states that she's been on trazodone and Ambien in the past with good results on Ambien.  Anxiety  Patient recently experiencing anxiety.  She states that she's had a few episodes of sudden onset palpitations, shortness of breath and overwhelming anxiety. Last for approximately 2-3 minutes and then resolve spontaneously.  No reports of chest pain.  This is been quite concerning for her recently.  She would like to discuss this today.  Right great toe pain  Patient states that over the past week she's had a stiff/sore right great toe.  She states that she has a history of gout although it's never been treated with prescription medication.  She does not recall any recent fall, trauma, injury.  She believes that her pain is related to gout.  She like to discuss this today.  PMH, Surgical Hx, Family Hx, Social History reviewed and updated as below.  Past Medical History:  Diagnosis Date  . Allergy   . Arthritis   . Basal cell carcinoma    face, back, neck  . Chicken pox   . Depression   . Diabetes mellitus without complication ([place])   . GERD (gastroesophageal reflux disease)   . Gout   . History of shingles   . Hyperlipidemia   . Hypertension   . Migraine   . Obesity   . Osteopenia     Past Surgical History:  Procedure Laterality Date  . ABDOMINAL HYSTERECTOMY    . APPENDECTOMY    . basal cell removed    . CESAREAN SECTION N/A    2  . CHOLECYSTECTOMY    . cyst removed from wrist    . ROTATOR CUFF REPAIR     lt shoulder  . TUBAL LIGATION    . wedge resection of ovary  1982   Family History  Problem Relation Age of Onset  . Heart disease Mother   . Breast cancer Mother 5  . Arthritis Mother   . Hyperlipidemia Mother   . Heart disease Father   . Lung cancer Father   . Hyperlipidemia Father   . Hypertension Father   . Diabetes Sister   . Diabetes Maternal Aunt   . Diabetes Maternal Uncle   . Diabetes Maternal Grandfather   . Hyperlipidemia Maternal Grandfather   . Arthritis Maternal Grandmother   . Sudden death Cousin   . Diabetes Maternal Aunt   . Colon cancer Neg Hx   . Ovarian cancer Neg Hx    Social History  Substance Use Topics  . Smoking status: Never Smoker  . Smokeless tobacco: Never Used  . Alcohol use No   Review of Systems  Cardiovascular: Positive for palpitations.  Musculoskeletal:       Great toe pain (  right)  Neurological: Positive for dizziness and headaches.  Psychiatric/Behavioral:       Anxiety, stress, memory difficulty.  All other systems reviewed and are negative.  Objective:   Today's Vitals: BP 130/76 (BP Location: Left Arm, Patient Position: Sitting, Cuff Size: Normal)   Pulse 65   Resp 14   Ht 5\' 4"  (1.626 m)   Wt 162 lb 8 oz (73.7 kg)   SpO2 98%   BMI 27.89 kg/m   Physical Exam  Constitutional: She is oriented to person, place, and time. She appears well-developed. No distress.  HENT:  Head: Normocephalic and atraumatic.  Mouth/Throat: Oropharynx is clear and moist.  Eyes: Conjunctivae are normal. No scleral icterus.  Neck: Neck supple.  Cardiovascular: Normal rate and regular rhythm.   Soft [id number] systolic murmur.   Pulmonary/Chest: Effort normal and breath sounds normal.  Abdominal: Soft. She  exhibits no distension. There is no tenderness. There is no rebound and no guarding.  Musculoskeletal:  Right great toe - MTP joint tender to palpation. Mild warmth. No erythema.  Neurological: She is alert and oriented to person, place, and time.  Skin: Skin is warm and dry. No rash noted.  Psychiatric: She has a normal mood and affect.  Vitals reviewed.  Assessment & Plan:   Problem List Items Addressed This Visit    Insomnia    Stopping restoril. Starting Ambien.      Hypertension    At goal.  Will continue to monitor closely.       Hyperlipidemia    Unsure of control. Obtaining labs.      Great toe pain, right    New problem. ? Gout.  Uric acid today. Advised PRN NSAID's.      Diabetes mellitus type 2, controlled ([place]) - Primary    A1C today to evaluate current control.      Anxiety    New problem. History consistent with panic attack. Given the fact that they resolve quickly she wants to just monitor and avoid medication at this time.         Outpatient Encounter Prescriptions as of 12/11/2015  Medication Sig  . [DISCONTINUED] temazepam (RESTORIL) 22.5 MG capsule Take 1 capsule (22.5 mg total) by mouth at bedtime as needed for sleep.  Mar[place] Kitchen zolpidem (AMBIEN) 5 MG tablet Take 1 tablet (5 mg total) by mouth at bedtime as needed for sleep.  . [DISCONTINUED] cyclobenzaprine (FLEXERIL) 10 MG tablet Take 1 tablet (10 mg total) by mouth at bedtime as needed for muscle spasms.  . [DISCONTINUED] meloxicam (MOBIC) 15 MG tablet Take 1 tablet (15 mg total) by mouth daily as needed for pain.  . [DISCONTINUED] omeprazole (PRILOSEC) 40 MG capsule Take 1 capsule (40 mg total) by mouth daily.   No facility-administered encounter medications on file as of 12/11/2015.     Follow-up: 6 months  [place] DO [facility]

## 2015-12-12 NOTE — Assessment & Plan Note (Signed)
Unsure of control. Obtaining labs.

## 2015-12-12 NOTE — Assessment & Plan Note (Signed)
New problem. ? Gout.  Uric acid today. Advised PRN NSAID's.

## 2015-12-12 NOTE — Assessment & Plan Note (Signed)
New problem. History consistent with panic attack. Given the fact that they resolve quickly she wants to just monitor and avoid medication at this time.

## 2015-12-12 NOTE — Assessment & Plan Note (Signed)
A1C today to evaluate current control.

## 2015-12-12 NOTE — Assessment & Plan Note (Signed)
Stopping restoril. Starting Ambien.

## 2015-12-12 NOTE — Assessment & Plan Note (Signed)
At goal.  Will continue to monitor closely.

## 2015-12-17 ENCOUNTER — Other Ambulatory Visit: Payer: Self-pay | Admitting: Family Medicine

## 2015-12-18 LAB — LIPID PANEL W/O CHOL/HDL RATIO
CHOLESTEROL TOTAL: 247 mg/dL — AB (ref 100–199)
HDL: 60 mg/dL (ref >39)
LDL CALC: 150 mg/dL — AB (ref 0–99)
TRIGLYCERIDES: 184 mg/dL — AB (ref 0–149)
VLDL CHOLESTEROL CAL: 37 mg/dL (ref 5–40)

## 2015-12-18 LAB — COMPREHENSIVE METABOLIC PANEL
A/G RATIO: 1.5 (ref 1.2–2.2)
ALT: 15 IU/L (ref 0–32)
AST: 17 IU/L (ref 0–40)
Albumin: 4.4 g/dL (ref 3.5–5.5)
Alkaline Phosphatase: 114 IU/L (ref 39–117)
BILIRUBIN TOTAL: 0.3 mg/dL (ref 0.0–1.2)
BUN/Creatinine Ratio: 23 (ref 9–23)
BUN: 15 mg/dL (ref 6–24)
CALCIUM: 9.4 mg/dL (ref 8.7–10.2)
CHLORIDE: 102 mmol/L (ref 96–106)
CO2: 24 mmol/L (ref 18–29)
Creatinine, Ser: 0.66 mg/dL (ref 0.57–1.00)
GFR calc Af Amer: 114 mL/min/{1.73_m2} (ref >59)
GFR, EST NON AFRICAN AMERICAN: 99 mL/min/{1.73_m2} (ref >59)
GLUCOSE: 91 mg/dL (ref 65–99)
Globulin, Total: 2.9 g/dL (ref 1.5–4.5)
POTASSIUM: 4.4 mmol/L (ref 3.5–5.2)
Sodium: 142 mmol/L (ref 134–144)
Total Protein: 7.3 g/dL (ref 6.0–8.5)

## 2015-12-18 LAB — CBC
HEMATOCRIT: 42.6 % (ref 34.0–46.6)
HEMOGLOBIN: 14.2 g/dL (ref 11.1–15.9)
MCH: 29.1 pg (ref 26.6–33.0)
MCHC: 33.3 g/dL (ref 31.5–35.7)
MCV: 87 fL (ref 79–97)
Platelets: 292 10*3/uL (ref 150–379)
RBC: 4.88 x10E6/uL (ref 3.77–5.28)
RDW: 14.2 % (ref 12.3–15.4)
WBC: 7.7 10*3/uL (ref 3.4–10.8)

## 2015-12-18 LAB — MICROALBUMIN / CREATININE URINE RATIO
Creatinine, Urine: 37.9 mg/dL
Microalb/Creat Ratio: <7.9 mg/g creat (ref 0.0–30.0)
Microalbumin, Urine: <3 ug/mL

## 2015-12-18 LAB — HGB A1C W/O EAG: Hgb A1c MFr Bld: 5.8 % — ABNORMAL HIGH (ref 4.8–5.6)

## 2015-12-18 LAB — URIC ACID: URIC ACID: 5.3 mg/dL (ref 2.5–7.1)

## 2015-12-25 ENCOUNTER — Ambulatory Visit: Payer: Managed Care, Other (non HMO) | Admitting: Family Medicine

## 2015-12-27 ENCOUNTER — Ambulatory Visit (INDEPENDENT_AMBULATORY_CARE_PROVIDER_SITE_OTHER): Payer: Managed Care, Other (non HMO) | Admitting: Obstetrics and Gynecology

## 2015-12-27 ENCOUNTER — Encounter: Payer: Self-pay | Admitting: Obstetrics and Gynecology

## 2015-12-27 VITALS — BP 159/65 | HR 64 | Ht 64.0 in | Wt 162.7 lb

## 2015-12-27 DIAGNOSIS — Z780 Asymptomatic menopausal state: Secondary | ICD-10-CM | POA: Diagnosis not present

## 2015-12-27 DIAGNOSIS — Z1231 Encounter for screening mammogram for malignant neoplasm of breast: Secondary | ICD-10-CM | POA: Diagnosis not present

## 2015-12-27 DIAGNOSIS — Z01419 Encounter for gynecological examination (general) (routine) without abnormal findings: Secondary | ICD-10-CM | POA: Diagnosis not present

## 2015-12-27 DIAGNOSIS — R030 Elevated blood-pressure reading, without diagnosis of hypertension: Secondary | ICD-10-CM

## 2015-12-27 DIAGNOSIS — N952 Postmenopausal atrophic vaginitis: Secondary | ICD-10-CM

## 2015-12-27 DIAGNOSIS — N951 Menopausal and female climacteric states: Secondary | ICD-10-CM | POA: Diagnosis not present

## 2015-12-27 DIAGNOSIS — R2689 Other abnormalities of gait and mobility: Secondary | ICD-10-CM

## 2015-12-27 DIAGNOSIS — Z1239 Encounter for other screening for malignant neoplasm of breast: Secondary | ICD-10-CM

## 2015-12-27 MED ORDER — ESTRADIOL 1 MG PO TABS: 1.0000 mg | ORAL_TABLET | Freq: Every day | ORAL | 12 refills | Status: DC

## 2015-12-27 NOTE — Patient Instructions (Addendum)
1. No Pap smear needed 2. Mammogram is ordered 3. Stool guaiac testing for colon cancer screening is declined 4. Screening labs will be obtained through primary care 5. Continue with healthy eating and exercise 6. Restart estradiol 1 mg daily 7. Return in 1 year for annual exam or as needed if vasomotor symptoms persist on estradiol    Health Maintenance for Postmenopausal Women Introduction Menopause is a normal process in which your reproductive ability comes to an end. This process happens gradually over a span of months to years, usually between the ages of 28 and 45. Menopause is complete when you have missed 12 consecutive menstrual periods. It is important to talk with your health care provider about some of the most common conditions that affect postmenopausal women, such as heart disease, cancer, and bone loss (osteoporosis). Adopting a healthy lifestyle and getting preventive care can help to promote your health and wellness. Those actions can also lower your chances of developing some of these common conditions. What should I know about menopause? During menopause, you may experience a number of symptoms, such as:  Moderate-to-severe hot flashes.  Night sweats.  Decrease in sex drive.  Mood swings.  Headaches.  Tiredness.  Irritability.  Memory problems.  Insomnia. Choosing to treat or not to treat menopausal changes is an individual decision that you make with your health care provider. What should I know about hormone replacement therapy and supplements? Hormone therapy products are effective for treating symptoms that are associated with menopause, such as hot flashes and night sweats. Hormone replacement carries certain risks, especially as you become older. If you are thinking about using estrogen or estrogen with progestin treatments, discuss the benefits and risks with your health care provider. What should I know about heart disease and stroke? Heart disease,  heart attack, and stroke become more likely as you age. This may be due, in part, to the hormonal changes that your body experiences during menopause. These can affect how your body processes dietary fats, triglycerides, and cholesterol. Heart attack and stroke are both medical emergencies. There are many things that you can do to help prevent heart disease and stroke:  Have your blood pressure checked at least every 1-2 years. High blood pressure causes heart disease and increases the risk of stroke.  If you are 75-37 years old, ask your health care provider if you should take aspirin to prevent a heart attack or a stroke.  Do not use any tobacco products, including cigarettes, chewing tobacco, or electronic cigarettes. If you need help quitting, ask your health care provider.  It is important to eat a healthy diet and maintain a healthy weight.  Be sure to include plenty of vegetables, fruits, low-fat dairy products, and lean protein.  Avoid eating foods that are high in solid fats, added sugars, or salt (sodium).  Get regular exercise. This is one of the most important things that you can do for your health.  Try to exercise for at least 150 minutes each week. The type of exercise that you do should increase your heart rate and make you sweat. This is known as moderate-intensity exercise.  Try to do strengthening exercises at least twice each week. Do these in addition to the moderate-intensity exercise.  Know your numbers.Ask your health care provider to check your cholesterol and your blood glucose. Continue to have your blood tested as directed by your health care provider. What should I know about cancer screening? There are several types of cancer. Take  the following steps to reduce your risk and to catch any cancer development as early as possible. Breast Cancer  Practice breast self-awareness.  This means understanding how your breasts normally appear and feel.  It also means  doing regular breast self-exams. Let your health care provider know about any changes, no matter how small.  If you are 54 or older, have a clinician do a breast exam (clinical breast exam or CBE) every year. Depending on your age, family history, and medical history, it may be recommended that you also have a yearly breast X-ray (mammogram).  If you have a family history of breast cancer, talk with your health care provider about genetic screening.  If you are at high risk for breast cancer, talk with your health care provider about having an MRI and a mammogram every year.  Breast cancer (BRCA) gene test is recommended for women who have family members with BRCA-related cancers. Results of the assessment will determine the need for genetic counseling and BRCA1 and for BRCA2 testing. BRCA-related cancers include these types:  Breast. This occurs in males or females.  Ovarian.  Tubal. This may also be called fallopian tube cancer.  Cancer of the abdominal or pelvic lining (peritoneal cancer).  Prostate.  Pancreatic. Cervical, Uterine, and Ovarian Cancer  Your health care provider may recommend that you be screened regularly for cancer of the pelvic organs. These include your ovaries, uterus, and vagina. This screening involves a pelvic exam, which includes checking for microscopic changes to the surface of your cervix (Pap test).  For women ages 21-65, health care providers may recommend a pelvic exam and a Pap test every three years. For women ages 14-65, they may recommend the Pap test and pelvic exam, combined with testing for human papilloma virus (HPV), every five years. Some types of HPV increase your risk of cervical cancer. Testing for HPV may also be done on women of any age who have unclear Pap test results.  Other health care providers may not recommend any screening for nonpregnant women who are considered low risk for pelvic cancer and have no symptoms. Ask your health care  provider if a screening pelvic exam is right for you.  If you have had past treatment for cervical cancer or a condition that could lead to cancer, you need Pap tests and screening for cancer for at least 20 years after your treatment. If Pap tests have been discontinued for you, your risk factors (such as having a new sexual partner) need to be reassessed to determine if you should start having screenings again. Some women have medical problems that increase the chance of getting cervical cancer. In these cases, your health care provider may recommend that you have screening and Pap tests more often.  If you have a family history of uterine cancer or ovarian cancer, talk with your health care provider about genetic screening.  If you have vaginal bleeding after reaching menopause, tell your health care provider.  There are currently no reliable tests available to screen for ovarian cancer. Lung Cancer  Lung cancer screening is recommended for adults 27-72 years old who are at high risk for lung cancer because of a history of smoking. A yearly low-dose CT scan of the lungs is recommended if you:  Currently smoke.  Have a history of at least 30 pack-years of smoking and you currently smoke or have quit within the past 15 years. A pack-year is smoking an average of one pack of cigarettes per  day for one year. Yearly screening should:  Continue until it has been 15 years since you quit.  Stop if you develop a health problem that would prevent you from having lung cancer treatment. Colorectal Cancer  This type of cancer can be detected and can often be prevented.  Routine colorectal cancer screening usually begins at age 58 and continues through age 93.  If you have risk factors for colon cancer, your health care provider may recommend that you be screened at an earlier age.  If you have a family history of colorectal cancer, talk with your health care provider about genetic  screening.  Your health care provider may also recommend using home test kits to check for hidden blood in your stool.  A small camera at the end of a tube can be used to examine your colon directly (sigmoidoscopy or colonoscopy). This is done to check for the earliest forms of colorectal cancer.  Direct examination of the colon should be repeated every 5-10 years until age 63. However, if early forms of precancerous polyps or small growths are found or if you have a family history or genetic risk for colorectal cancer, you may need to be screened more often. Skin Cancer  Check your skin from head to toe regularly.  Monitor any moles. Be sure to tell your health care provider:  About any new moles or changes in moles, especially if there is a change in a mole's shape or color.  If you have a mole that is larger than the size of a pencil eraser.  If any of your family members has a history of skin cancer, especially at a young age, talk with your health care provider about genetic screening.  Always use sunscreen. Apply sunscreen liberally and repeatedly throughout the day.  Whenever you are outside, protect yourself by wearing long sleeves, pants, a wide-brimmed hat, and sunglasses. What should I know about osteoporosis? Osteoporosis is a condition in which bone destruction happens more quickly than new bone creation. After menopause, you may be at an increased risk for osteoporosis. To help prevent osteoporosis or the bone fractures that can happen because of osteoporosis, the following is recommended:  If you are 13-17 years old, get at least 1,000 mg of calcium and at least 600 mg of vitamin D per day.  If you are older than age 62 but younger than age 64, get at least 1,200 mg of calcium and at least 600 mg of vitamin D per day.  If you are older than age 18, get at least 1,200 mg of calcium and at least 800 mg of vitamin D per day. Smoking and excessive alcohol intake increase the  risk of osteoporosis. Eat foods that are rich in calcium and vitamin D, and do weight-bearing exercises several times each week as directed by your health care provider. What should I know about how menopause affects my mental health? Depression may occur at any age, but it is more common as you become older. Common symptoms of depression include:  Low or sad mood.  Changes in sleep patterns.  Changes in appetite or eating patterns.  Feeling an overall lack of motivation or enjoyment of activities that you previously enjoyed.  Frequent crying spells. Talk with your health care provider if you think that you are experiencing depression. What should I know about immunizations? It is important that you get and maintain your immunizations. These include:  Tetanus, diphtheria, and pertussis (Tdap) booster vaccine.  Influenza every  year before the flu season begins.  Pneumonia vaccine.  Shingles vaccine. Your health care provider may also recommend other immunizations. This information is not intended to replace advice given to you by your health care provider. Make sure you discuss any questions you have with your health care provider. Document Released: 02/28/2005 Document Revised: 07/27/2015 Document Reviewed: 10/10/2014  2017 [employer]vier

## 2015-12-27 NOTE — Addendum Note (Signed)
Addended by: Elouise Munroe on: 12/27/2015 02:43 PM   Modules accepted: Orders

## 2015-12-27 NOTE — Progress Notes (Signed)
Patient ID: Kristin [name], female   DOB: October 29, 1960, 55 y.o.   MRN: [id number] ANNUAL PREVENTATIVE CARE GYN  ENCOUNTER NOTE  Subjective:       Kristin [name] is a 55 y.o. No obstetric history on file. female here for a routine annual gynecologic exam.  Current complaints: 1.  Hot flashes and night sweats 2. Loss of balance-patient has been having some dizziness and spontaneous loss of balance over the past year. She denies headaches, unusual visual or auditory symptoms. She denies loss of strength, tremors, changes in smell.  Patient has lost 33 pounds since last year after undergoing a gastric sleeve operation. She is feeling well. Main concern today is severe resurgence or persistence of all flashes after going off of estrogen replacement therapy. She has been on ERT for 5 years and would like to go back on the hormone.   Gynecologic History No LMP recorded. Patient has had a hysterectomy. Contraception: status post hysterectomy Last Pap: no further paps. Results were: normal Last mammogram: 01/2015 birad 1. Results were: normal  Obstetric History OB History  No data available    Past Medical History:  Diagnosis Date  . Allergy   . Arthritis   . Basal cell carcinoma    face, back, neck  . Chicken pox   . Depression   . Diabetes mellitus without complication ([place])   . GERD (gastroesophageal reflux disease)   . Gout   . History of shingles   . Hyperlipidemia   . Hypertension   . Migraine   . Obesity   . Osteopenia     Past Surgical History:  Procedure Laterality Date  . ABDOMINAL HYSTERECTOMY    . APPENDECTOMY    . basal cell removed    . CESAREAN SECTION N/A    2  . CHOLECYSTECTOMY    . cyst removed from wrist    . ROTATOR CUFF REPAIR     lt shoulder  . TUBAL LIGATION    . wedge resection of ovary  1982    Current Outpatient Prescriptions on File Prior to Visit  Medication Sig Dispense Refill  . zolpidem (AMBIEN) 5 MG tablet Take 1 tablet (5 mg  total) by mouth at bedtime as needed for sleep. 90 tablet 1   No current facility-administered medications on file prior to visit.     Allergies  Allergen Reactions  . Tetracyclines & Related Swelling and Rash    FACE SWELL, RASH ON CHEST  . Metoprolol Diarrhea  . Tetracycline Swelling    FACE SWELL, RASH ON CHEST  . Alcohol Rash    ABDOMINAL CRAMPS    Social History   Social History  . Marital status: Married    Spouse name: N/A  . Number of children: N/A  . Years of education: N/A   Occupational History  . Not on file.   Social History Main Topics  . Smoking status: Never Smoker  . Smokeless tobacco: Never Used  . Alcohol use No  . Drug use: No  . Sexual activity: Yes    Partners: Male   Other Topics Concern  . Not on file   Social History Narrative  . No narrative on file    Family History  Problem Relation Age of Onset  . Heart disease Mother   . Breast cancer Mother 7  . Arthritis Mother   . Hyperlipidemia Mother   . Heart disease Father   . Lung cancer Father   . Hyperlipidemia Father   .  Hypertension Father   . Diabetes Sister   . Diabetes Maternal Aunt   . Diabetes Maternal Uncle   . Diabetes Maternal Grandfather   . Hyperlipidemia Maternal Grandfather   . Arthritis Maternal Grandmother   . Sudden death Cousin   . Diabetes Maternal Aunt   . Colon cancer Neg Hx   . Ovarian cancer Neg Hx     The following portions of the patient's history were reviewed and updated as appropriate: allergies, current medications, past family history, past medical history, past social history, past surgical history and problem list.  Review of Systems ROS Review of Systems - General ROS: negative for - chills, fatigue, fever, hot flashes, night sweats, weight gain or weight loss Psychological ROS: negative for - anxiety, decreased libido, depression, mood swings, physical abuse or sexual abuse Ophthalmic ROS: negative for - blurry vision, eye pain or loss of  vision ENT ROS: negative for - headaches, hearing change, visual changes or vocal changes Allergy and Immunology ROS: negative for - hives, itchy/watery eyes or seasonal allergies Hematological and Lymphatic ROS: negative for - bleeding problems, bruising, swollen lymph nodes or weight loss Endocrine ROS: negative for - galactorrhea, hair pattern changes, hot flashes, malaise/lethargy, mood swings, palpitations, polydipsia/polyuria, skin changes, temperature intolerance or unexpected weight changes Breast ROS: negative for - new or changing breast lumps or nipple discharge Respiratory ROS: negative for - cough or shortness of breath Cardiovascular ROS: negative for - chest pain, irregular heartbeat, palpitations or shortness of breath Gastrointestinal ROS: no abdominal pain, change in bowel habits, or black or bloody stools Genito-Urinary ROS: no dysuria, trouble voiding, or hematuria Musculoskeletal ROS: negative for - joint pain or joint stiffness Neurological ROS: negative for - bowel and bladder control changes Dermatological ROS: negative for rash and skin lesion changes   Objective:   Ht 5\' 4"  (1.626 m)  BP (!) 159/65   Pulse 64   Ht 5\' 4"  (1.626 m)   Wt 162 lb 11.2 oz (73.8 kg)   BMI 27.93 kg/m   CONSTITUTIONAL: Well-developed, well-nourished female in no acute distress.  PSYCHIATRIC: Normal mood and affect. Normal behavior. Normal judgment and thought content. [place]: Alert and oriented to person, place, and time. Normal muscle tone coordination. No cranial nerve deficit noted. HENT:  Normocephalic, atraumatic, External right and left ear normal. Oropharynx is clear and moist EYES: Conjunctivae and EOM are normal. Pupils are equal, round, and reactive to light. No scleral icterus.  NECK: Normal range of motion, supple, no masses.  Normal thyroid.  SKIN: Skin is warm and dry. No rash noted. Not diaphoretic. No erythema. No pallor. CARDIOVASCULAR: Normal heart rate noted,  regular rhythm, no murmur. RESPIRATORY: Clear to auscultation bilaterally. Effort and breath sounds normal, no problems with respiration noted. BREASTS: Symmetric in size. No masses, skin changes, nipple drainage, or lymphadenopathy. ABDOMEN: Soft, normal bowel sounds, no distention noted.  No tenderness, rebound or guarding.  BLADDER: Normal PELVIC:  External Genitalia: Normal  BUS: Normal Except for small urethral caruncle  Vagina: Mild atrophy; good vault support  Cervix: Surgically absent  Uterus: Surgically absent  Adnexa: Normal  RV: External Exam NormaI, No Rectal Masses and Normal Sphincter tone  MUSCULOSKELETAL: Normal range of motion. No tenderness.  No cyanosis, clubbing, or edema.  2+ distal pulses. LYMPHATIC: No Axillary, Supraclavicular, or Inguinal Adenopathy.    Assessment:   Annual gynecologic examination 55 y.o. Contraception: status post hysterectomy bmi- 27 Hot flashes affecting quality of life Loss of balance and dizziness 1 year,  unclear etiology; no focal findings on head and neck exam with cranial nerves appearing intact  Plan:  Pap: Not needed Mammogram: Ordered Stool Guaiac Testing:  declined Labs:  Thru pcp  Routine preventative health maintenance measures emphasized: Exercise/Diet/Weight control and Alcohol/Substance use risks  Started estradiol 1 mg a day. Return of vasomotor symptoms and hot flashes persist on the medication. Neurology referral Return to [place], [place]  Brayton Mars, MD  Note: This dictation was prepared with Dragon dictation along with smaller phrase technology. Any transcriptional errors that result from this process are unintentional.

## 2016-01-08 ENCOUNTER — Other Ambulatory Visit: Payer: Self-pay | Admitting: Family Medicine

## 2016-01-08 ENCOUNTER — Telehealth: Payer: Self-pay | Admitting: Family Medicine

## 2016-01-08 MED ORDER — ZOLPIDEM TARTRATE 10 MG PO TABS: 10.0000 mg | ORAL_TABLET | Freq: Every evening | ORAL | 0 refills | Status: DC | PRN

## 2016-01-08 NOTE — Telephone Encounter (Signed)
Pt called and is requesting a dosage increase on zolpidem (AMBIEN) 5 MG tablet. Pt states that she can fall asleep but does not stay asleep. Please advise, thank you!  Call pt @ [phone]  Pharmacy - [id number] - [place], [address]

## 2016-01-08 NOTE — Telephone Encounter (Signed)
rx faxed

## 2016-01-08 NOTE — Telephone Encounter (Signed)
Recommendations?

## 2016-01-08 NOTE — Telephone Encounter (Signed)
Will increase.  Can fax or pick up.

## 2016-01-18 ENCOUNTER — Other Ambulatory Visit: Payer: Self-pay | Admitting: Obstetrics and Gynecology

## 2016-01-22 ENCOUNTER — Encounter: Payer: Managed Care, Other (non HMO) | Admitting: Obstetrics and Gynecology

## 2016-02-08 ENCOUNTER — Encounter: Payer: Self-pay | Admitting: Family Medicine

## 2016-02-08 ENCOUNTER — Ambulatory Visit (INDEPENDENT_AMBULATORY_CARE_PROVIDER_SITE_OTHER): Payer: Managed Care, Other (non HMO) | Admitting: Family Medicine

## 2016-02-08 VITALS — BP 139/83 | HR 69 | Temp 98.2°F | Resp 14 | Wt 163.6 lb

## 2016-02-08 DIAGNOSIS — B9789 Other viral agents as the cause of diseases classified elsewhere: Secondary | ICD-10-CM | POA: Diagnosis not present

## 2016-02-08 DIAGNOSIS — J069 Acute upper respiratory infection, unspecified: Secondary | ICD-10-CM | POA: Diagnosis not present

## 2016-02-08 LAB — POCT RAPID STREP A (OFFICE): Rapid Strep A Screen: NEGATIVE

## 2016-02-08 MED ORDER — HYDROCOD POLST-CPM POLST ER 10-8 MG/5ML PO SUER: 5.0000 mL | Freq: Two times a day (BID) | ORAL | 0 refills | Status: DC | PRN

## 2016-02-08 NOTE — Assessment & Plan Note (Signed)
New acute problem. Exam unremarkable. Strep negative. Tussionex as needed for cough. Flonase and antihistamine as well.

## 2016-02-08 NOTE — Progress Notes (Signed)
Pre visit review using our clinic review tool, if applicable. No additional management support is needed unless otherwise documented below in the visit note. 

## 2016-02-08 NOTE — Patient Instructions (Addendum)
This is viral. Antibiotics won't help.  Use OTC Tylenol, motrin as needed for pain.  Flonase and Zyrtec or Claritin for the sore throat/congestion.  Use the cough medicine as needed.   Take care  Dr. Lacinda Axon

## 2016-02-08 NOTE — Progress Notes (Signed)
   Subjective:  Patient ID: Kristin [name], female    DOB: 1960-03-17  Age: 56 y.o. MRN: [id number]  CC: URI  HPI:  56 year old female presents with URI symptoms.  Patient states that she started not feeling well last night. She's had sore throat, left ear pain, cough, and headache. Symptoms are severe per her report. No associated fever or chills. She's been using naproxen and DayQuil without improvement. No other associated symptoms. No other complaints or concerns at this time.  Social Hx   Social History   Social History  . Marital status: Married    Spouse name: N/A  . Number of children: N/A  . Years of education: N/A   Social History Main Topics  . Smoking status: Never Smoker  . Smokeless tobacco: Never Used  . Alcohol use No  . Drug use: No  . Sexual activity: Yes    Partners: Male    Birth control/ protection: Surgical   Other Topics Concern  . None   Social History Narrative  . None   Review of Systems  Constitutional: Negative for fever.  HENT: Positive for ear pain and sore throat.   Respiratory: Positive for cough.   Neurological: Positive for headaches.   Objective:  BP 139/83   Pulse 69   Temp 98.2 F (36.8 C) (Oral)   Resp 14   Wt 163 lb 9.6 oz (74.2 kg)   SpO2 98%   BMI 28.08 kg/m   BP/Weight 02/08/2016 12/27/2015 [id number]  Systolic BP [id number]  Diastolic BP 83 65 76  Wt. (Lbs) 163.6 162.7 162.5  BMI 28.08 27.93 27.89   Physical Exam  Constitutional: She is oriented to person, place, and time. She appears well-developed. No distress.  HENT:  Mouth/Throat: Oropharynx is clear and moist.  Normal TMs bilaterally.  Neck: Neck supple.  Cardiovascular: Normal rate and regular rhythm.   Pulmonary/Chest: Effort normal and breath sounds normal.  Lymphadenopathy:    She has no cervical adenopathy.  Neurological: She is alert and oriented to person, place, and time.  Psychiatric: She has a normal mood and affect.  Vitals  reviewed.  Lab Results  Component Value Date   WBC 7.7 12/17/2015   HGB 13.3 01/17/2014   HCT 42.6 12/17/2015   PLT 292 12/17/2015   GLUCOSE 91 12/17/2015   CHOL 247 (H) 12/17/2015   TRIG 184 (H) 12/17/2015   HDL 60 12/17/2015   LDLCALC 150 (H) 12/17/2015   ALT 15 12/17/2015   AST 17 12/17/2015   NA 142 12/17/2015   K 4.4 12/17/2015   CL 102 12/17/2015   CREATININE 0.66 12/17/2015   BUN 15 12/17/2015   CO2 24 12/17/2015   HGBA1C 5.8 (H) 12/17/2015    Assessment & Plan:   Problem List Items Addressed This Visit    Viral upper respiratory tract infection - Primary    New acute problem. Exam unremarkable. Strep negative. Tussionex as needed for cough. Flonase and antihistamine as well.      Relevant Orders   POCT rapid strep A (Completed)     Meds ordered this encounter  Medications  . chlorpheniramine-HYDROcodone (TUSSIONEX PENNKINETIC ER) 10-8 MG/5ML SUER    Sig: Take 5 mLs by mouth every 12 (twelve) hours as needed.    Dispense:  115 mL    Refill:  0    Follow-up: PRN  [place]

## 2016-02-12 ENCOUNTER — Encounter: Payer: Self-pay | Admitting: Family Medicine

## 2016-02-12 ENCOUNTER — Ambulatory Visit (INDEPENDENT_AMBULATORY_CARE_PROVIDER_SITE_OTHER): Payer: Managed Care, Other (non HMO) | Admitting: Family Medicine

## 2016-02-12 DIAGNOSIS — J069 Acute upper respiratory infection, unspecified: Secondary | ICD-10-CM

## 2016-02-12 DIAGNOSIS — B9789 Other viral agents as the cause of diseases classified elsewhere: Secondary | ICD-10-CM

## 2016-02-12 MED ORDER — AZITHROMYCIN 250 MG PO TABS: ORAL_TABLET | ORAL | 0 refills | Status: DC

## 2016-02-12 NOTE — Assessment & Plan Note (Signed)
Established problem, worsening per patient report. Advised that this still appears to be viral. Advised supportive care. Proceeding with delayed antibiotic (prescription given for azithromycin to be field only if she fails to improve or worsens).

## 2016-02-12 NOTE — Progress Notes (Signed)
Pre visit review using our clinic review tool, if applicable. No additional management support is needed unless otherwise documented below in the visit note. 

## 2016-02-12 NOTE — Progress Notes (Signed)
   Subjective:  Patient ID: Kristin [name], female    DOB: May 16, 1960  Age: 56 y.o. MRN: [id number]  CC: Cough  HPI:  56 year old female presents with complaints of cough.  She was recently seen on Friday after developing symptoms on Thursday. I saw her and diagnosed her with an upper respiratory tract infection. I treated her with Tussionex. She had improvement in her cough with Tussionex but it was short-lived. She states that she continues to feel poorly. Continues to have sore throat as well. Cough is productive. No associated fever. No known exacerbating factors.  Social Hx   Social History   Social History  . Marital status: Married    Spouse name: N/A  . Number of children: N/A  . Years of education: N/A   Social History Main Topics  . Smoking status: Never Smoker  . Smokeless tobacco: Never Used  . Alcohol use No  . Drug use: No  . Sexual activity: Yes    Partners: Male    Birth control/ protection: Surgical   Other Topics Concern  . None   Social History Narrative  . None   Review of Systems  HENT: Positive for sore throat.   Respiratory: Positive for cough.    Objective:  BP (!) 142/87   Pulse 85   Temp 98.1 F (36.7 C) (Oral)   Wt 162 lb (73.5 kg)   SpO2 96%   BMI 27.81 kg/m   BP/Weight 02/12/2016 02/08/2016 [id number]  Systolic BP [id number]  Diastolic BP 87 83 65  Wt. (Lbs) 162 163.6 162.7  BMI 27.81 28.08 27.93   Physical Exam  Constitutional: She is oriented to person, place, and time. She appears well-developed. No distress.  HENT:  Mouth/Throat: Oropharynx is clear and moist.  Cardiovascular: Normal rate and regular rhythm.   Pulmonary/Chest: Effort normal and breath sounds normal.  Neurological: She is alert and oriented to person, place, and time.  Vitals reviewed.  Lab Results  Component Value Date   WBC 7.7 12/17/2015   HGB 13.3 01/17/2014   HCT 42.6 12/17/2015   PLT 292 12/17/2015   GLUCOSE 91 12/17/2015   CHOL 247 (H)  12/17/2015   TRIG 184 (H) 12/17/2015   HDL 60 12/17/2015   LDLCALC 150 (H) 12/17/2015   ALT 15 12/17/2015   AST 17 12/17/2015   NA 142 12/17/2015   K 4.4 12/17/2015   CL 102 12/17/2015   CREATININE 0.66 12/17/2015   BUN 15 12/17/2015   CO2 24 12/17/2015   HGBA1C 5.8 (H) 12/17/2015    Assessment & Plan:   Problem List Items Addressed This Visit    Viral upper respiratory tract infection    Established problem, worsening per patient report. Advised that this still appears to be viral. Advised supportive care. Proceeding with delayed antibiotic (prescription given for azithromycin to be field only if she fails to improve or worsens).      Relevant Medications   azithromycin (ZITHROMAX) 250 MG tablet      Meds ordered this encounter  Medications  . azithromycin (ZITHROMAX) 250 MG tablet    Sig: 2 tablets on Day 1 then 1 tablet daily on days 2-5.    Dispense:  6 tablet    Refill:  0    Follow-up: PRN  [place]

## 2016-02-12 NOTE — Patient Instructions (Signed)
I still feel that this is viral.  If you fail to improve, take the antibiotic.  Take care  Dr. Lacinda Axon

## 2016-03-08 ENCOUNTER — Ambulatory Visit
Admission: EM | Admit: 2016-03-08 | Discharge: 2016-03-08 | Disposition: A | Discharge status: Home / Self Care | Payer: Managed Care, Other (non HMO) | Attending: Family Medicine | Admitting: Family Medicine

## 2016-03-08 ENCOUNTER — Ambulatory Visit (INDEPENDENT_AMBULATORY_CARE_PROVIDER_SITE_OTHER): Payer: Managed Care, Other (non HMO)

## 2016-03-08 DIAGNOSIS — M25572 Pain in left ankle and joints of left foot: Secondary | ICD-10-CM | POA: Diagnosis not present

## 2016-03-08 MED ORDER — TRAMADOL HCL 50 MG PO TABS: 50.0000 mg | ORAL_TABLET | Freq: Three times a day (TID) | ORAL | 0 refills | Status: DC | PRN

## 2016-03-08 NOTE — Discharge Instructions (Signed)
Take medication as prescribed as needed. Ice. Rest.   Follow up with orthopedic or podiatry as discussed this week for continued pain.   Follow up with your primary care physician this week as needed. Return to Urgent care for new or worsening concerns.

## 2016-03-08 NOTE — ED Triage Notes (Signed)
Patient complains of left ankle pain after tripping over dishwasher door 2 weeks ago. Patient reports that she twisted her ankle in the parking lot 5 days ago. Patient reports swelling and pain-worse with walking.

## 2016-03-08 NOTE — ED Provider Notes (Signed)
[facility] ____________________________________________  Time seen: Approximately 6:00 PM  I have reviewed the triage vital signs and the nursing notes.   HISTORY  Chief Complaint Ankle Pain   HPI Kristin [name] is a 56 y.o. female present with husband at bedside for the complaint of left medial ankle pain. Patient reports 2 weeks ago injured left ankle after tripping over dishwasher door, and reports pain has been improving, but reports 5 days ago she rolled her left ankle in parking lot. Denies any other pain or injury. Patient reports again that pain started to improve, never fully resolved, and then had reinjury. Patient reports has been taking over-the-counter naproxen with some improvement, no resolution. Reports she has continued to remain active, weightbearing and has not yet rest ankle. Denies paresthesias, break in skin, other injury. Denies head injury or loss consciousness. States mild to moderate pain at this time.  Denies chest pain, shortness of breath, abdominal pain, dysuria, other extremity pain, other extremity swelling or rash. Denies recent sickness. Denies recent antibiotic use.   Coral Spikes, DO: PCP   Past Medical History:  Diagnosis Date  . Allergy   . Arthritis   . Basal cell carcinoma    face, back, neck  . Chicken pox   . Depression   . Diabetes mellitus without complication ([place])   . GERD (gastroesophageal reflux disease)   . Gout   . History of shingles   . Hyperlipidemia   . Hypertension   . Migraine   . Obesity   . Osteopenia     Patient Active Problem List   Diagnosis Date Noted  . Viral upper respiratory tract infection 02/08/2016  . Hyperlipidemia 12/12/2015  . Anxiety 12/12/2015  . Great toe pain, right 12/11/2015  . Diabetes mellitus type 2, controlled ([place]) 08/09/2014  . Hypertension 07/17/2014  . Insomnia 07/17/2014    Past Surgical History:  Procedure Laterality Date  . ABDOMINAL HYSTERECTOMY    .  APPENDECTOMY    . basal cell removed    . CESAREAN SECTION N/A    2  . CHOLECYSTECTOMY    . cyst removed from wrist    . ROTATOR CUFF REPAIR     lt shoulder  . STOMACH SURGERY    . TUBAL LIGATION    . wedge resection of ovary  1982     No current facility-administered medications for this encounter.   Current Outpatient Prescriptions:  .  estradiol (ESTRACE) 1 MG tablet, Take 1 tablet (1 mg total) by mouth daily., Disp: 30 tablet, Rfl: 12 .  zolpidem (AMBIEN) 10 MG tablet, Take 1 tablet (10 mg total) by mouth at bedtime as needed for sleep., Disp: 90 tablet, Rfl: 0 .  azithromycin (ZITHROMAX) 250 MG tablet, 2 tablets on Day 1 then 1 tablet daily on days 2-5., Disp: 6 tablet, Rfl: 0 .  chlorpheniramine-HYDROcodone (TUSSIONEX PENNKINETIC ER) 10-8 MG/5ML SUER, Take 5 mLs by mouth every 12 (twelve) hours as needed., Disp: 115 mL, Rfl: 0 .  traMADol (ULTRAM) 50 MG tablet, Take 1 tablet (50 mg total) by mouth every 8 (eight) hours as needed (Do not drive or operate machinery while taking as can cause drowsiness.)., Disp: 12 tablet, Rfl: 0  Allergies Tetracyclines & related; Metoprolol; Tetracycline; and Alcohol  Family History  Problem Relation Age of Onset  . Heart disease Mother   . Breast cancer Mother 32  . Arthritis Mother   . Hyperlipidemia Mother   . Heart disease Father   .  Lung cancer Father   . Hyperlipidemia Father   . Hypertension Father   . Diabetes Sister   . Diabetes Maternal Aunt   . Diabetes Maternal Uncle   . Diabetes Maternal Grandfather   . Hyperlipidemia Maternal Grandfather   . Arthritis Maternal Grandmother   . Sudden death Cousin   . Diabetes Maternal Aunt   . Colon cancer Neg Hx   . Ovarian cancer Neg Hx     Social History Social History  Substance Use Topics  . Smoking status: Never Smoker  . Smokeless tobacco: Never Used  . Alcohol use No    Review of Systems Constitutional: No fever/chills Eyes: No visual changes. ENT: No sore  throat. Cardiovascular: Denies chest pain. Respiratory: Denies shortness of breath. Gastrointestinal: No abdominal pain.  No nausea, no vomiting.  No diarrhea.  No constipation. Genitourinary: Negative for dysuria. Musculoskeletal: Negative for back pain. Skin: Negative for rash. Neurological: Negative for headaches, focal weakness or numbness.  10-point ROS otherwise negative.  ____________________________________________   PHYSICAL EXAM:  VITAL SIGNS: ED Triage Vitals  Enc Vitals Group     BP 03/08/16 1623 (!) 154/69     Pulse Rate 03/08/16 1623 74     Resp 03/08/16 1623 17     Temp 03/08/16 1623 97.6 F (36.4 C)     Temp Source 03/08/16 1623 Oral     SpO2 03/08/16 1623 98 %     Weight 03/08/16 1623 165 lb (74.8 kg)     Height 03/08/16 1623 5\' 3"  (1.6 m)     Head Circumference --      Peak Flow --      Pain Score 03/08/16 1624 4     Pain Loc --      Pain Edu? --      Excl. in [place]? --     Constitutional: Alert and oriented. Well appearing and in no acute distress. Eyes: Conjunctivae are normal. PERRL. EOMI. ENT      Head: Normocephalic and atraumatic. Cardiovascular: Normal rate, regular rhythm. Grossly normal heart sounds.  Good peripheral circulation. Respiratory: Normal respiratory effort without tachypnea nor retractions. Breath sounds are clear and equal bilaterally. No wheezes, rales, rhonchi. Musculoskeletal: No midline cervical, thoracic or lumbar tenderness to palpation. Bilateral pedal pulses equal and easily palpated. except: left medial malleolus mild to moderate diffuse tenderness to palpation, mild swelling, no ecchymosis, no lateral or posterior malleolar tenderness, achilles nontender, full range of motion, pain with left ankle rotation, mild pain with resisted plantar flexion and dorsiflexion. Normal distal sensation and capillary refill to left foot. Left lower extremity otherwise nontender. Gait not tested due to pain.  Neurologic:  Normal speech and  language. Speech is normal.  Skin:  Skin is warm, dry  Psychiatric: Mood and affect are normal. Speech and behavior are normal. Patient exhibits appropriate insight and judgment.   ___________________________________________   LABS (all labs ordered are listed, but only abnormal results are displayed)  Labs Reviewed - No data to display  RADIOLOGY  Dg Ankle Complete Left  Result Date: 03/08/2016 CLINICAL DATA:  Patient with medial left ankle pain for 2 weeks. EXAM: LEFT ANKLE COMPLETE - 3+ VIEW COMPARISON:  None. FINDINGS: Normal anatomic alignment. No evidence for acute fracture or dislocation. Corticated ossific density about the inferior medial malleolus. Regional soft tissues are unremarkable. Plantar and posterior calcaneal spurring. IMPRESSION: Corticated ossific density about the inferior medial malleolus may represent sequelae of prior injury. Recommend correlation for point tenderness. Electronically Signed  By: Lovey Newcomer M.D.   On: 03/08/2016 16:54   ____________________________________________   PROCEDURES Procedures  Left ankle stirrup splint applied by RN.  INITIAL IMPRESSION / ASSESSMENT AND PLAN / ED COURSE  Pertinent labs & imaging results that were available during my care of the patient were reviewed by me and considered in my medical decision making (see chart for details).  Well-appearing patient. No acute distress.  Presents of left ankle pain post mechanical injury 2 weeks ago and repeat injury 5 days ago. Left ankle x-ray per radiologist corticated ossific density around the inferior medial malleolus may represent sequela of prior injury. Patient is point tender in the same area. Discussed with patient remote injury. Encouraged supportive care. Ankle splint applied. Patient states that she has crutches at home and will use them. Discussed follow-up with podiatry or orthopedic. Patient states she'll take over-the-counter medication as needed, Rx for tramadol as  needed for breakthrough pain occurs ice, rest and supportive care. Information for orthopedic given. Patient reports that she has information for podiatry she see her in past..   Discussed follow up with Primary care physician this week. Discussed follow up and return parameters including no resolution or any worsening concerns. Patient verbalized understanding and agreed to plan.   ____________________________________________   FINAL CLINICAL IMPRESSION(S) / ED DIAGNOSES  Final diagnoses:  Acute left ankle pain     Discharge Medication List as of 03/08/2016  5:26 PM    START taking these medications   Details  traMADol (ULTRAM) 50 MG tablet Take 1 tablet (50 mg total) by mouth every 8 (eight) hours as needed (Do not drive or operate machinery while taking as can cause drowsiness.)., Starting Sat 03/08/2016, Print        Note: This dictation was prepared with Dragon dictation along with smaller phrase technology. Any transcriptional errors that result from this process are unintentional.         Marylene Land, NP 03/08/16 1806

## 2016-03-11 ENCOUNTER — Telehealth: Payer: Self-pay | Admitting: Family Medicine

## 2016-03-11 ENCOUNTER — Ambulatory Visit (INDEPENDENT_AMBULATORY_CARE_PROVIDER_SITE_OTHER): Payer: Managed Care, Other (non HMO) | Admitting: Podiatry

## 2016-03-11 VITALS — BP 151/75 | HR 77

## 2016-03-11 DIAGNOSIS — M659 Synovitis and tenosynovitis, unspecified: Secondary | ICD-10-CM | POA: Diagnosis not present

## 2016-03-11 DIAGNOSIS — M7752 Other enthesopathy of left foot: Secondary | ICD-10-CM

## 2016-03-11 DIAGNOSIS — R600 Localized edema: Secondary | ICD-10-CM | POA: Diagnosis not present

## 2016-03-11 DIAGNOSIS — M25572 Pain in left ankle and joints of left foot: Secondary | ICD-10-CM

## 2016-03-11 DIAGNOSIS — S82892A Other fracture of left lower leg, initial encounter for closed fracture: Secondary | ICD-10-CM

## 2016-03-11 MED ORDER — BETAMETHASONE SOD PHOS & ACET 6 (3-3) MG/ML IJ SUSP: 3.0000 mg | Freq: Once | INTRAMUSCULAR | Status: DC

## 2016-03-11 NOTE — Telephone Encounter (Signed)
Pt called and stated that she had gone to the walk in clinic on Saturday and found out that she had soft tissues damage and had broke her ankle about 2 months. Pt states that she called and orthopedic and they stated that they didn't do ankles that she needs to call a podiatrist. The podiatrist cannot see her for 3 weeks. Pt is in pain and not sure if she needs to come in for or what she should really do. Please advise, thank you!  Call pt @ [phone].

## 2016-03-11 NOTE — Telephone Encounter (Signed)
Left message to call.

## 2016-03-11 NOTE — Telephone Encounter (Signed)
Needs to be seen

## 2016-03-11 NOTE — Progress Notes (Signed)
   Subjective:  Patient presents today for evaluation of chronic left ankle pain. Patient states that approximately 2 months ago around Christmas time she hit the inside of her left ankle on a dishwasher. Patient has had chronic pain and tenderness ever since the injury with no alleviating symptoms. Patient then sustained a new injury to the left ankle when she rolled it on 03/04/2016 on a pebble. Patient went to the urgent care where radiographic exam was performed. Negative for acute fracture however there is evidence of chronic fracture to the medial malleolus of the left ankle joint.  Objective / Physical Exam:  General:  The patient is alert and oriented x3 in no acute distress. Dermatology:  Skin is warm, dry and supple bilateral lower extremities. Negative for open lesions or macerations. Vascular:  Palpable pedal pulses bilaterally. No edema or erythema noted. Capillary refill within normal limits. Neurological:  Epicritic and protective threshold grossly intact bilaterally.  Musculoskeletal Exam:  Pain on palpation to the medial aspects of the patient's left ankle. Mild edema noted.  Range of motion within normal limits to all pedal and ankle joints bilateral. Muscle strength 5/5 in all groups bilateral.   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 #2 injection of 0.5 mL Celestone Soluspan injected in the patient's left ankle. #3 immobilization cam boot dispensed #4 compression anklet dispensed #5 today we discussed the chronic ankle fracture in the importance of compression immobilization for the next 6 weeks. Patient can be weightbearing in the cam boot #6 return to clinic in 4 weeks  [name], DPM [facility]  Dr. [name], [address]                                        [place], [place]  [address]                Office [phone]  Fax [phone]

## 2016-03-11 NOTE — Telephone Encounter (Signed)
Note from urgent care 03/08/16  states to follow up with PCP as needed and see podiatry and ortho.    See note below .   Please advise.   Thanks

## 2016-03-14 NOTE — Telephone Encounter (Signed)
Spoke with patient seen by [facility]

## 2016-03-26 ENCOUNTER — Telehealth: Payer: Self-pay | Admitting: Podiatry

## 2016-03-26 NOTE — Telephone Encounter (Signed)
PT CALLED IN STATING SHE SAW DR. 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 WANTING TO KNOW IF SHE NEEDED TO BE SEEN OR IF THERE IS SOMETHING SHE COULD DO UNTIL HER NEXT APPT ON THE 27TH OF MARCH    PLEASE ADVISE

## 2016-03-27 ENCOUNTER — Ambulatory Visit (INDEPENDENT_AMBULATORY_CARE_PROVIDER_SITE_OTHER): Payer: Managed Care, Other (non HMO) | Admitting: Podiatry

## 2016-03-27 ENCOUNTER — Telehealth: Payer: Self-pay | Admitting: *Deleted

## 2016-03-27 ENCOUNTER — Ambulatory Visit (INDEPENDENT_AMBULATORY_CARE_PROVIDER_SITE_OTHER): Payer: Managed Care, Other (non HMO)

## 2016-03-27 DIAGNOSIS — S82892D Other fracture of left lower leg, subsequent encounter for closed fracture with routine healing: Secondary | ICD-10-CM

## 2016-03-27 DIAGNOSIS — M779 Enthesopathy, unspecified: Secondary | ICD-10-CM | POA: Diagnosis not present

## 2016-03-27 DIAGNOSIS — M659 Synovitis and tenosynovitis, unspecified: Secondary | ICD-10-CM

## 2016-03-27 MED ORDER — HYDROCODONE-ACETAMINOPHEN 5-325 MG PO TABS: 1.0000 | ORAL_TABLET | ORAL | 0 refills | Status: DC | PRN

## 2016-03-27 MED ORDER — METHYLPREDNISOLONE 4 MG PO TBPK: ORAL_TABLET | ORAL | 0 refills | Status: DC

## 2016-03-27 NOTE — Telephone Encounter (Signed)
Pt states has a broken ankle and initially hurt on the inside and now hurts on the outside terribly even in the boot. Grover Canavan states get pt in to see Dr. Milinda Pointer today. Pt to be seen at 1:30pm today.

## 2016-03-30 NOTE — Progress Notes (Signed)
Subjective: 56 year old female presents the office today for concerns of increasing pain to the left ankle. She has been ambulating in the cam boot and despite this she continues to have pain. She presents today for an unscheduled appointment. She's been taking tramadol for pain but this has not been helping. She denies any recent injury or trauma since her last appointment. She has not noticed any significant change in swelling. No erythema or warmth. Denies any systemic complaints such as fevers, chills, nausea, vomiting. No acute changes since last appointment, and no other complaints at this time.   Objective: AAO x3, NAD DP/PT pulses palpable bilaterally, CRT less than 3 seconds There is tenderness on the medial aspect of the left ankle mostly along the course of the deltoid ligament. There is no area pinpoint bony tenderness or pain the vibratory sensation. Recently she started out pain to the posterior aspect of the lateral ankle and the course of peroneal tendons. The tendons appear to be intact. There is minimal edema to the ankle there is no erythema or increase in warmth. There is no pain with ankle or subtalar range of motion. There is no gross ankle instability present. No open lesions or pre-ulcerative lesions.  No pain with calf compression, swelling, warmth, erythema  Assessment: Capsulitis left ankle, possible deltoid injury/tendinitis  Plan: -All treatment options discussed with the patient including all alternatives, risks, complications.  -repeat x-rays were obtained and reviewed. No definitive evidence of acute fracture identified. -Medrol dose pack and vicodin prescribed.  -She wants to take a week off of work and a note was provided for this.  -Continue CAM boot at all times. Ice/elevation -Discussed possible MRI if symptoms continue. -Follow-up with Dr. Amalia Hailey as scheduled or sooner if needed. -Patient encouraged to call the office with any questions, concerns, change in  symptoms.   Celesta Gentile, DPM

## 2016-04-01 ENCOUNTER — Other Ambulatory Visit: Payer: Self-pay | Admitting: Family Medicine

## 2016-04-01 NOTE — Telephone Encounter (Signed)
Refilled: 01/08/2016 Last OV: 12/11/2015 Last Labs: 12/17/15 Future OV: none Please advise?

## 2016-04-01 NOTE — Telephone Encounter (Signed)
faxed

## 2016-04-08 ENCOUNTER — Encounter: Payer: Self-pay | Admitting: Podiatry

## 2016-04-08 ENCOUNTER — Ambulatory Visit (INDEPENDENT_AMBULATORY_CARE_PROVIDER_SITE_OTHER): Payer: Managed Care, Other (non HMO) | Admitting: Podiatry

## 2016-04-08 DIAGNOSIS — M7752 Other enthesopathy of left foot: Secondary | ICD-10-CM

## 2016-04-08 DIAGNOSIS — M25572 Pain in left ankle and joints of left foot: Secondary | ICD-10-CM

## 2016-04-08 DIAGNOSIS — R600 Localized edema: Secondary | ICD-10-CM

## 2016-04-08 DIAGNOSIS — S82892D Other fracture of left lower leg, subsequent encounter for closed fracture with routine healing: Secondary | ICD-10-CM | POA: Diagnosis not present

## 2016-04-08 DIAGNOSIS — M659 Synovitis and tenosynovitis, unspecified: Secondary | ICD-10-CM | POA: Diagnosis not present

## 2016-04-08 DIAGNOSIS — S93422A Sprain of deltoid ligament of left ankle, initial encounter: Secondary | ICD-10-CM

## 2016-04-08 MED ORDER — IBUPROFEN-FAMOTIDINE 800-26.6 MG PO TABS: ORAL_TABLET | ORAL | 1 refills | Status: DC

## 2016-04-09 ENCOUNTER — Telehealth: Payer: Self-pay | Admitting: Podiatry

## 2016-04-09 NOTE — Addendum Note (Signed)
Addended by: Graceann Congress D on: 04/09/2016 05:02 PM   Modules accepted: Orders

## 2016-04-09 NOTE — Progress Notes (Signed)
   Subjective:  Patient presents today for follow-up evaluation of left ankle pain and swelling. Patient states that the pain is still very significant. Patient has gone through several months of conservative treatment without any progression or satisfactory alleviation of symptoms the patient. Patient states that approximately 2 months ago around Christmas time she hit the inside of her left ankle on a dishwasher. Patient has had chronic pain and tenderness ever since the injury with no alleviating symptoms. Patient then sustained a new injury to the left ankle when she rolled it on 03/04/2016 on a pebble.    Objective/Physical Exam General: The patient is alert and oriented x3 in no acute distress.  Dermatology: Skin is warm, dry and supple bilateral lower extremities. Negative for open lesions or macerations.  Vascular: Palpable pedal pulses bilaterally. No edema or erythema noted. Capillary refill within normal limits.  Neurological: Epicritic and protective threshold grossly intact bilaterally.   Musculoskeletal Exam: Severe pain on palpation noted to the medial aspect of the patient's left ankle.  Radiographic Exam:  Today I personally reassessed the x-rays which were taken on last visit. There may be a small cortical irregularity noted to the medial shoulder of the talus.  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 [name], [address]                                         [place], [place] [address]                Office [phone]  Fax [phone]

## 2016-04-09 NOTE — Telephone Encounter (Signed)
Patient informed that MRI has to be approved by insurance, once that is done, I will call her to get scheduled for MRI

## 2016-04-09 NOTE — Telephone Encounter (Signed)
Pt calling to see if her Mri has been ordered from her visit yesterday with Dr Amalia Hailey? Please call pt and let her know

## 2016-04-14 ENCOUNTER — Telehealth: Payer: Self-pay | Admitting: *Deleted

## 2016-04-14 NOTE — Telephone Encounter (Addendum)
-----   Message from [name], DPM sent at 04/09/2016  8:07 AM EDT ----- Regarding: MRI left ankle Please order MRI left ankle, with or without contrast. Diagnosis: Possible talar dome lesion, chronic medial ankle pain, possible deltoid ligament rupture  Thanks, Dr. Amalia Hailey. 04/14/2016-Orders to [facility] for pre-cert.

## 2016-04-15 ENCOUNTER — Ambulatory Visit: Payer: Managed Care, Other (non HMO) | Admitting: Podiatry

## 2016-04-17 ENCOUNTER — Telehealth: Payer: Self-pay

## 2016-04-17 NOTE — Telephone Encounter (Signed)
Received Authorization approval via fax from [place].  Auth # [id number] effective until 07/10/16

## 2016-04-28 ENCOUNTER — Encounter: Payer: Self-pay | Admitting: Podiatry

## 2016-04-28 ENCOUNTER — Ambulatory Visit
Admission: RE | Admit: 2016-04-28 | Discharge: 2016-04-28 | Disposition: A | Discharge status: Home / Self Care | Payer: Managed Care, Other (non HMO) | Source: Ambulatory Visit | Attending: Podiatry | Admitting: Podiatry

## 2016-04-28 DIAGNOSIS — R600 Localized edema: Secondary | ICD-10-CM | POA: Insufficient documentation

## 2016-04-28 DIAGNOSIS — S93422A Sprain of deltoid ligament of left ankle, initial encounter: Secondary | ICD-10-CM | POA: Diagnosis present

## 2016-04-28 DIAGNOSIS — M948X7 Other specified disorders of cartilage, ankle and foot: Secondary | ICD-10-CM | POA: Diagnosis not present

## 2016-04-28 DIAGNOSIS — M93272 Osteochondritis dissecans, left ankle and joints of left foot: Secondary | ICD-10-CM | POA: Insufficient documentation

## 2016-04-28 DIAGNOSIS — M19072 Primary osteoarthritis, left ankle and foot: Secondary | ICD-10-CM | POA: Diagnosis not present

## 2016-04-28 DIAGNOSIS — S82892D Other fracture of left lower leg, subsequent encounter for closed fracture with routine healing: Secondary | ICD-10-CM

## 2016-04-29 ENCOUNTER — Ambulatory Visit: Payer: Managed Care, Other (non HMO)

## 2016-05-06 ENCOUNTER — Ambulatory Visit: Payer: Managed Care, Other (non HMO) | Admitting: Podiatry

## 2016-05-08 ENCOUNTER — Ambulatory Visit
Admission: RE | Admit: 2016-05-08 | Discharge: 2016-05-08 | Disposition: A | Discharge status: Home / Self Care | Payer: Managed Care, Other (non HMO) | Source: Ambulatory Visit | Attending: Obstetrics and Gynecology | Admitting: Obstetrics and Gynecology

## 2016-05-08 DIAGNOSIS — Z1231 Encounter for screening mammogram for malignant neoplasm of breast: Secondary | ICD-10-CM | POA: Insufficient documentation

## 2016-05-08 DIAGNOSIS — Z1239 Encounter for other screening for malignant neoplasm of breast: Secondary | ICD-10-CM

## 2016-05-13 ENCOUNTER — Encounter: Payer: Self-pay | Admitting: Podiatry

## 2016-05-13 ENCOUNTER — Ambulatory Visit (INDEPENDENT_AMBULATORY_CARE_PROVIDER_SITE_OTHER): Payer: Managed Care, Other (non HMO) | Admitting: Podiatry

## 2016-05-13 ENCOUNTER — Telehealth: Payer: Self-pay | Admitting: *Deleted

## 2016-05-13 DIAGNOSIS — M949 Disorder of cartilage, unspecified: Secondary | ICD-10-CM

## 2016-05-13 DIAGNOSIS — M899 Disorder of bone, unspecified: Secondary | ICD-10-CM

## 2016-05-13 DIAGNOSIS — M722 Plantar fascial fibromatosis: Secondary | ICD-10-CM

## 2016-05-13 DIAGNOSIS — M659 Synovitis and tenosynovitis, unspecified: Secondary | ICD-10-CM

## 2016-05-13 NOTE — Telephone Encounter (Signed)
"  I saw Dr. Amalia Hailey today and I'd like to schedule my surgery."  Did you sign consent forms?  "Yes, I signed everything I needed to sign."  I don't have your information.  Let me call you tomorrow when I get your information.  "Okay, that will be fine."

## 2016-05-13 NOTE — Progress Notes (Signed)
   HPI:  56 year old otherwise healthy female presents today for follow-up evaluation of left ankle pain as well as chronic plantar fasciitis to the left foot and ankle. Last visit MRI was ordered for the left ankle. Patient presents today to review the MRI results. She states that the ankle feels a little bit better in her CAM boot however she still has pain moreso on the evenings. Patient also has a significant complaint for chronic plantar fasciitis left foot. She states she's been dealing with the plantar fasciitis for several years now. Patient has received multiple injections, physical therapy, and multiple conservative modalities to alleviate symptoms with no avail. All conservative modalities have been unsuccessful in providing any sort of satisfactory alleviation of symptoms for the patient.    Physical Exam: General: The patient is alert and oriented x3 in no acute distress.  Dermatology: Skin is warm, dry and supple bilateral lower extremities. Negative for open lesions or macerations.  Vascular: Palpable pedal pulses bilaterally. No edema or erythema noted. Capillary refill within normal limits.  Neurological: Epicritic and protective threshold grossly intact bilaterally.   Musculoskeletal Exam: Significant pain on palpation to the plantar medial calcaneal tubercle of the left plantar fascia insertion.  Pain on palpation and range of motion also noted to the left ankle joint.  Range of motion within normal limits to all pedal and ankle joints bilateral. Muscle strength 5/5 in all groups bilateral.   Assessment: 1. Osteochondral lesion medial shoulder talus left 2. Inflammatory ankle joint synovitis left 3. Chronic plantar fasciitis left    Plan of Care:  1. Patient was evaluated. MRI was reviewed in detail today. 2. Today we discussed additional conservative versus surgical management of the osteochondral lesion as well as the chronic plantar fasciitis. Weighing out the options  of conservative versus surgical management, the patient opts for surgical management. All patient questions were answered today. No guarantees were expressed or implied. All possible palpitations and details the procedure were explained. 3. Authorization for surgery initiated today. Surgery will consist of ankle arthroscopic synovectomy left. Osteochondral drilling left talus, possibly open. Endoscopic plantar fasciotomy left. Surgery will be performed at [facility]. 4. Return to clinic 1 week postop   [name], DPM [facility]  Dr. [name], [address]                                        [place], [place] [address]                Office [phone]  Fax [phone]

## 2016-05-14 NOTE — Telephone Encounter (Addendum)
"  I'm calling about scheduling my surgery.  Have you received my paperwork?"  No, I have not received it yet.  I can put you down tentatively for a date.  Do you know where surgery will be performed and do you know what procedure you are having?  "I am having it at [facility] and it's going to be on my ankle."  What date would you like to schedule surgery?  "I'd like to do it as soon as possible."  He may be able to do it on May 10, I have to see if the time is available at [facility].  Dr. Amalia Hailey does not have block time there.  I will let you know if there is a problem.  "Is there anything else I need to do?"  You have to have a history and physical form filled out by your primary care physician and you have to have seen a doctor within the past 30 days.  "I didn't receive that form."  Can you go by the [place] office to get the form?  "I have to go out later, I will stop by there then."

## 2016-05-15 NOTE — Telephone Encounter (Signed)
Patient came by office this afternoon and picked up a surgical clearance form for PCP to fill out.

## 2016-05-16 NOTE — Telephone Encounter (Signed)
"  I'm calling to see if everything is good for my surgery."  Yes, everything is good.  We have you scheduled for 05/29/2016 at [facility], main OR.  "Do I have them send the completed history and physical form to you, [place] office or directly to the surgery center?"  The cover sheet of the history and physical sheet has the fax number for me and for the surgery center.

## 2016-05-20 ENCOUNTER — Telehealth: Payer: Self-pay | Admitting: *Deleted

## 2016-05-20 ENCOUNTER — Ambulatory Visit: Payer: Managed Care, Other (non HMO) | Admitting: Family Medicine

## 2016-05-20 ENCOUNTER — Encounter: Payer: Self-pay | Admitting: Family Medicine

## 2016-05-20 ENCOUNTER — Ambulatory Visit (INDEPENDENT_AMBULATORY_CARE_PROVIDER_SITE_OTHER): Payer: Managed Care, Other (non HMO) | Admitting: Family Medicine

## 2016-05-20 VITALS — BP 140/80 | HR 68 | Temp 98.1°F | Wt 172.0 lb

## 2016-05-20 DIAGNOSIS — Z01818 Encounter for other preprocedural examination: Secondary | ICD-10-CM | POA: Insufficient documentation

## 2016-05-20 NOTE — Progress Notes (Signed)
Pre visit review using our clinic review tool, if applicable. No additional management support is needed unless otherwise documented below in the visit note. 

## 2016-05-20 NOTE — Progress Notes (Signed)
Subjective:  Patient ID: Kristin [name], female    DOB: [birth year]  Age: 56 y.o. MRN: [id number]  CC: Pre Op clearance/exam  HPI:  56 year old female with a history of DM 2, hypertension, hyperlipidemia who has done quite well status post bariatric surgery presents for preoperative clearance.  Patient has been seeing podiatry. She has had ongoing chronic left ankle pain. She had x-rays, injection, and medications without definitive etiology or improvement. As a result, she had an MRI which revealed an osteochondral lesion of the talus. Given her lack of improvement with conservative treatment, she is going to undergo surgery (ankle arthroscopy). She presents today for preoperative examination/clearance. She has forms with her today. She states that she continues to have severe left ankle pain which interfered with ambulation. She is in a cam walker. She's taking duexis for pain. She is eager to have surgery to get her problem resolved. She has no complaints of chest pain or shortness of breath. Her blood pressure is elevated.  Social Hx   Social History   Social History  . Marital status: Married    Spouse name: N/A  . Number of children: N/A  . Years of education: N/A   Social History Main Topics  . Smoking status: Never Smoker  . Smokeless tobacco: Never Used  . Alcohol use No  . Drug use: No  . Sexual activity: Yes    Partners: Male    Birth control/ protection: Surgical   Other Topics Concern  . None   Social History Narrative  . None    Review of Systems  Respiratory: Negative.   Cardiovascular: Negative.   Musculoskeletal:       Left ankle pain.   Objective:  BP 140/80   Pulse 68   Temp 98.1 F (36.7 C) (Oral)   Wt 172 lb (78 kg)   SpO2 97%   BMI 30.47 kg/m   BP/Weight 05/20/2016 03/11/2016 9/89/2119  Systolic BP 417 408 144  Diastolic BP 80 75 69  Wt. (Lbs) 172 - 165  BMI 30.47 - 29.23   Physical Exam  Constitutional: She is oriented to person,  place, and time. She appears well-developed. No distress.  Cardiovascular: Normal rate and regular rhythm.   Pulmonary/Chest: Effort normal and breath sounds normal.  Musculoskeletal:  Left ankle with severe tenderness to palpation.  Neurological: She is alert and oriented to person, place, and time.  Psychiatric: She has a normal mood and affect.  Vitals reviewed.   Lab Results  Component Value Date   WBC 7.7 12/17/2015   HGB 13.3 01/17/2014   HCT 42.6 12/17/2015   PLT 292 12/17/2015   GLUCOSE 91 12/17/2015   CHOL 247 (H) 12/17/2015   TRIG 184 (H) 12/17/2015   HDL 60 12/17/2015   LDLCALC 150 (H) 12/17/2015   ALT 15 12/17/2015   AST 17 12/17/2015   NA 142 12/17/2015   K 4.4 12/17/2015   CL 102 12/17/2015   CREATININE 0.66 12/17/2015   BUN 15 12/17/2015   CO2 24 12/17/2015   HGBA1C 5.8 (H) 12/17/2015    Assessment & Plan:   Problem List Items Addressed This Visit    Preoperative clearance - Primary    New problem. Patient low risk for complications, 8.1% for any complication per NSQIP. Per algorithm, patient is at low risk and she can proceed with surgery without further workup. Obtaining laboratory studies today.      Relevant Orders   CBC   Hemoglobin A1c  Comprehensive metabolic panel   Lipid panel   TSH     Follow-up: PRN  [place]

## 2016-05-20 NOTE — Assessment & Plan Note (Signed)
New problem. Patient low risk for complications, 3.4% for any complication per NSQIP. Per algorithm, patient is at low risk and she can proceed with surgery without further workup. Obtaining laboratory studies today.

## 2016-05-20 NOTE — Telephone Encounter (Signed)
I am calling to let you know we received your history and physical form from your primary care doctor.  "Awesome, they weren't sure where to send it so I just told them to send it to both numbers."  You were correct.

## 2016-05-21 ENCOUNTER — Other Ambulatory Visit: Payer: Self-pay | Admitting: Family Medicine

## 2016-05-21 LAB — COMPREHENSIVE METABOLIC PANEL
A/G RATIO: 1.6 (ref 1.2–2.2)
ALT: 14 IU/L (ref 0–32)
AST: 16 IU/L (ref 0–40)
Albumin: 4.2 g/dL (ref 3.5–5.5)
Alkaline Phosphatase: 88 IU/L (ref 39–117)
BUN/Creatinine Ratio: 23 (ref 9–23)
BUN: 13 mg/dL (ref 6–24)
CALCIUM: 9 mg/dL (ref 8.7–10.2)
CHLORIDE: 99 mmol/L (ref 96–106)
CO2: 24 mmol/L (ref 18–29)
Creatinine, Ser: 0.57 mg/dL (ref 0.57–1.00)
GFR, EST AFRICAN AMERICAN: 120 mL/min/{1.73_m2} (ref >59)
GFR, EST NON AFRICAN AMERICAN: 104 mL/min/{1.73_m2} (ref >59)
GLOBULIN, TOTAL: 2.6 g/dL (ref 1.5–4.5)
Glucose: 115 mg/dL — ABNORMAL HIGH (ref 65–99)
POTASSIUM: 4.2 mmol/L (ref 3.5–5.2)
SODIUM: 140 mmol/L (ref 134–144)
Total Protein: 6.8 g/dL (ref 6.0–8.5)

## 2016-05-21 LAB — LIPID PANEL
CHOL/HDL RATIO: 3.5 ratio (ref 0.0–4.4)
Cholesterol, Total: 237 mg/dL — ABNORMAL HIGH (ref 100–199)
HDL: 67 mg/dL (ref >39)
LDL Calculated: 111 mg/dL — ABNORMAL HIGH (ref 0–99)
TRIGLYCERIDES: 296 mg/dL — AB (ref 0–149)
VLDL Cholesterol Cal: 59 mg/dL — ABNORMAL HIGH (ref 5–40)

## 2016-05-21 LAB — CBC
HEMOGLOBIN: 13.3 g/dL (ref 11.1–15.9)
Hematocrit: 40.7 % (ref 34.0–46.6)
MCH: 29.1 pg (ref 26.6–33.0)
MCHC: 32.7 g/dL (ref 31.5–35.7)
MCV: 89 fL (ref 79–97)
PLATELETS: 316 10*3/uL (ref 150–379)
RBC: 4.57 x10E6/uL (ref 3.77–5.28)
RDW: 14.5 % (ref 12.3–15.4)
WBC: 8.8 10*3/uL (ref 3.4–10.8)

## 2016-05-21 LAB — HEMOGLOBIN A1C
Est. average glucose Bld gHb Est-mCnc: 117 mg/dL
HEMOGLOBIN A1C: 5.7 % — AB (ref 4.8–5.6)

## 2016-05-21 LAB — TSH: TSH: 2.61 u[IU]/mL (ref 0.450–4.500)

## 2016-05-21 MED ORDER — ROSUVASTATIN CALCIUM 20 MG PO TABS: 20.0000 mg | ORAL_TABLET | Freq: Every day | ORAL | 3 refills | Status: DC

## 2016-05-22 NOTE — Pre-Procedure Instructions (Signed)
Kristin [name]  05/22/2016      [id number] - [place], [place] - [address] [place] [address] Phone: [phone] Fax: [phone]    Your procedure is scheduled on Thursday May 10 .  Report to [facility] at 11:45 A.M.  Call this number if you have problems the morning of surgery:  [phone]   Remember:  Do not eat food or drink liquids after midnight.  Take these medicines the morning of surgery with A SIP OF WATER: omeprazole (prilosec), estradiol (estrace)  7 days prior to surgery STOP taking any Aspirin, Aleve, Naproxen, Ibuprofen, Motrin, Advil, Goody's, BC's, all herbal medications, fish oil, and all vitamins     Do not wear jewelry, make-up or nail polish.  Do not wear lotions, powders, or perfumes, or deoderant.  Do not shave 48 hours prior to surgery.  Men may shave face and neck.  Do not bring valuables to the hospital.  [facility] is not responsible for any belongings or valuables.  Contacts, dentures or bridgework may not be worn into surgery.  Leave your suitcase in the car.  After surgery it may be brought to your room.  For patients admitted to the hospital, discharge time will be determined by your treatment team.  Patients discharged the day of surgery will not be allowed to drive home.    Special instructions:    [facility]- Preparing For Surgery  Before surgery, you can play an important role. Because skin is not sterile, your skin needs to be as free of germs as possible. You can reduce the number of germs on your skin by washing with CHG (chlorahexidine gluconate) Soap before surgery.  CHG is an antiseptic cleaner which kills germs and bonds with the skin to continue killing germs even after washing.  Please do not use if you have an allergy to CHG or antibacterial soaps. If your skin becomes reddened/irritated stop using the CHG.  Do not shave (including legs and underarms) for at  least 48 hours prior to first CHG shower. It is OK to shave your face.  Please follow these instructions carefully.   1. Shower the NIGHT BEFORE SURGERY and the MORNING OF SURGERY with CHG.   2. If you chose to wash your hair, wash your hair first as usual with your normal shampoo.  3. After you shampoo, rinse your hair and body thoroughly to remove the shampoo.  4. Use CHG as you would any other liquid soap. You can apply CHG directly to the skin and wash gently with a scrungie or a clean washcloth.   5. Apply the CHG Soap to your body ONLY FROM THE NECK DOWN.  Do not use on open wounds or open sores. Avoid contact with your eyes, ears, mouth and genitals (private parts). Wash genitals (private parts) with your normal soap.  6. Wash thoroughly, paying special attention to the area where your surgery will be performed.  7. Thoroughly rinse your body with warm water from the neck down.  8. DO NOT shower/wash with your normal soap after using and rinsing off the CHG Soap.  9. Pat yourself dry with a CLEAN TOWEL.   10. Wear CLEAN PAJAMAS   11. Place CLEAN SHEETS on your bed the night of your first shower and DO NOT SLEEP WITH PETS.    Day of Surgery: Do not apply any deodorants/lotions. Please wear clean clothes to the hospital/surgery center.  Please read over the following fact sheets that you were given. MRSA Information

## 2016-05-23 ENCOUNTER — Telehealth: Payer: Self-pay | Admitting: *Deleted

## 2016-05-23 ENCOUNTER — Encounter (HOSPITAL_COMMUNITY): Payer: Self-pay

## 2016-05-23 ENCOUNTER — Encounter (HOSPITAL_COMMUNITY)
Admission: RE | Admit: 2016-05-23 | Discharge: 2016-05-23 | Disposition: A | Discharge status: Home / Self Care | Payer: Managed Care, Other (non HMO) | Source: Ambulatory Visit | Attending: Podiatry | Admitting: Podiatry

## 2016-05-23 ENCOUNTER — Other Ambulatory Visit: Payer: Self-pay

## 2016-05-23 DIAGNOSIS — I10 Essential (primary) hypertension: Secondary | ICD-10-CM | POA: Diagnosis not present

## 2016-05-23 DIAGNOSIS — E119 Type 2 diabetes mellitus without complications: Secondary | ICD-10-CM | POA: Diagnosis not present

## 2016-05-23 DIAGNOSIS — E785 Hyperlipidemia, unspecified: Secondary | ICD-10-CM | POA: Insufficient documentation

## 2016-05-23 DIAGNOSIS — Z01812 Encounter for preprocedural laboratory examination: Secondary | ICD-10-CM | POA: Diagnosis present

## 2016-05-23 DIAGNOSIS — Z01810 Encounter for preprocedural cardiovascular examination: Secondary | ICD-10-CM | POA: Diagnosis present

## 2016-05-23 DIAGNOSIS — F419 Anxiety disorder, unspecified: Secondary | ICD-10-CM | POA: Diagnosis not present

## 2016-05-23 HISTORY — DX: Adverse effect of unspecified anesthetic, initial encounter: T41.45XA

## 2016-05-23 HISTORY — DX: Other complications of anesthesia, initial encounter: T88.59XA

## 2016-05-23 LAB — SURGICAL PCR SCREEN
MRSA, PCR: NEGATIVE
STAPHYLOCOCCUS AUREUS: NEGATIVE

## 2016-05-23 LAB — GLUCOSE, CAPILLARY: Glucose-Capillary: 111 mg/dL — ABNORMAL HIGH (ref 65–99)

## 2016-05-23 NOTE — Telephone Encounter (Signed)
"  Please call me regarding request for a stress test."    I'm returning your call.  How can I help you?  "I am confused about what's going on.  Dr. Lacinda Axon did my history and physical form.  I was cleared for surgery.  I spoke to nurse at [facility] and she stated I had to get a stress test prior to having surgery.  Did you all request this test?"  Dr. Amalia Hailey did not request the test.  I haven't sent any medical clearance letters to any doctors for clearance.  "I don't know where this came from.  I'm going to assume I am cleared for surgery.  Thank you so much."

## 2016-05-23 NOTE — Progress Notes (Addendum)
Received call from Patient stating someone from [facility] clinic called her stating they could not clear her for surgery because they hadn't seen her in 2 years. Per pt they stated that they could not send records that were requested d/t this. I spoke with receptionist at [facility] clinic stating I had not requested surgical clearance with my fax, but had requested EKG tracing and stress test with my fax. Unsure whether someone else had requested cardiac clearance? She stated she can send stress test and EKG tracing, but patient is scheduled to come for appointment on 05/26/16. Receptionist to speak with nurse at clinic and call patient regarding this appointment.

## 2016-05-23 NOTE — Progress Notes (Signed)
PCP - Jason Fila Cardiologist - none, had to see Dr. Nehemiah Massed for stress test, echo and EKG prior to gastric sleeve surgery in 2016. Stress test and EKG requested  EKG - 05/23/2016  Stress Test - 2016, requested ECHO - 2016 in care everywhere  Pt reports having HTN and DM prior to gastric sleeve surgery, states she is "pre-diabetic" now and does not check CBG at home. Last A1c 5.7. CBG today 111.   Patient denies shortness of breath, fever, cough and chest pain at PAT appointment  Patient verbalized understanding of instructions that was given to them at the PAT appointment. Patient expressed that there were no further questions.  Patient was also instructed that they will need to review over the PAT instructions again at home before the surgery.

## 2016-05-26 NOTE — Progress Notes (Addendum)
Anesthesia Chart Review:  Pt is a 56 year old female scheduled for L ankle arthroscopy, L endoscopic plantar fasciotomy, L osteochondral drilling talus on 05/29/2016 with Daylene Katayama, DPM  - PCP is Thersa Salt, DO who cleared pt for surgery at last office visit 05/20/16.    PMH includes: HTN, DM (controlled with diet and weight loss), hyperlipidemia, GERD. Never smoker. BMI 30. S/p gastric sleeve surgery.   Anesthesia history: Pt reports elevated BP after gastric sleeve surgery 10/19/14 at [facility].  Anesthesia and admission notes are in care everywhere; a review shows pt had elevated BP the next day before discharge that was treated with cozaar and hydralazine.   Medications include: Prilosec, Crestor  Labs from [employer] office 05/20/16 reviewed.  HbA1c was 5.7, glucose 115  EKG 05/23/16: NSR  Exercise stress test 08/28/14 [facility] clinic, done for pre-op eval for gastric bypass): - Normal treadmill ECG without evidence of ischemia or arrhythmia.  PCP filled out H&P form 05/20/16 (scanned into media tab); there is also an H&P from last office visit 05/20/16 with PCP in [place].   If no changes, I anticipate pt can proceed with surgery as scheduled.   Willeen Cass, FNP-BC [facility] Short Stay Surgical Center/Anesthesiology Phone: [phone] 05/26/2016 2:49 PM

## 2016-05-29 ENCOUNTER — Ambulatory Visit (HOSPITAL_COMMUNITY): Payer: Managed Care, Other (non HMO) | Admitting: Vascular Surgery

## 2016-05-29 ENCOUNTER — Encounter (HOSPITAL_COMMUNITY): Payer: Self-pay | Admitting: *Deleted

## 2016-05-29 ENCOUNTER — Ambulatory Visit (HOSPITAL_COMMUNITY): Payer: Managed Care, Other (non HMO) | Admitting: Certified Registered Nurse Anesthetist

## 2016-05-29 ENCOUNTER — Encounter (HOSPITAL_COMMUNITY)
Admission: RE | Disposition: A | Discharge status: Home / Self Care | Payer: Self-pay | Source: Ambulatory Visit | Attending: Podiatry

## 2016-05-29 ENCOUNTER — Ambulatory Visit (HOSPITAL_COMMUNITY)
Admission: RE | Admit: 2016-05-29 | Discharge: 2016-05-29 | Disposition: A | Discharge status: Home / Self Care | Payer: Managed Care, Other (non HMO) | Source: Ambulatory Visit | Attending: Podiatry | Admitting: Podiatry

## 2016-05-29 ENCOUNTER — Other Ambulatory Visit (HOSPITAL_COMMUNITY): Payer: Self-pay | Admitting: *Deleted

## 2016-05-29 DIAGNOSIS — Z79890 Hormone replacement therapy (postmenopausal): Secondary | ICD-10-CM | POA: Insufficient documentation

## 2016-05-29 DIAGNOSIS — M25572 Pain in left ankle and joints of left foot: Secondary | ICD-10-CM | POA: Diagnosis present

## 2016-05-29 DIAGNOSIS — Z79899 Other long term (current) drug therapy: Secondary | ICD-10-CM | POA: Diagnosis not present

## 2016-05-29 DIAGNOSIS — I10 Essential (primary) hypertension: Secondary | ICD-10-CM | POA: Insufficient documentation

## 2016-05-29 DIAGNOSIS — M25872 Other specified joint disorders, left ankle and foot: Secondary | ICD-10-CM | POA: Diagnosis not present

## 2016-05-29 DIAGNOSIS — M722 Plantar fascial fibromatosis: Secondary | ICD-10-CM | POA: Insufficient documentation

## 2016-05-29 DIAGNOSIS — K219 Gastro-esophageal reflux disease without esophagitis: Secondary | ICD-10-CM | POA: Insufficient documentation

## 2016-05-29 DIAGNOSIS — M659 Synovitis and tenosynovitis, unspecified: Secondary | ICD-10-CM | POA: Diagnosis not present

## 2016-05-29 DIAGNOSIS — E119 Type 2 diabetes mellitus without complications: Secondary | ICD-10-CM | POA: Diagnosis not present

## 2016-05-29 HISTORY — PX: ANKLE ARTHROSCOPY: SHX545

## 2016-05-29 HISTORY — PX: OSTEOCHONDROMA EXCISION: SHX2137

## 2016-05-29 HISTORY — PX: PLANTAR FASCIA RELEASE: SHX2239

## 2016-05-29 LAB — GLUCOSE, CAPILLARY: Glucose-Capillary: 100 mg/dL — ABNORMAL HIGH (ref 65–99)

## 2016-05-29 SURGERY — ARTHROSCOPY, ANKLE
Anesthesia: General | Laterality: Left

## 2016-05-29 MED ORDER — EPHEDRINE SULFATE-NACL 50-0.9 MG/10ML-% IV SOSY
PREFILLED_SYRINGE | INTRAVENOUS | Status: DC | PRN
  Administered 2016-05-29: 5 mg via INTRAVENOUS

## 2016-05-29 MED ORDER — CEFAZOLIN SODIUM-DEXTROSE 2-4 GM/100ML-% IV SOLN
2.0000 g | INTRAVENOUS | Status: AC
  Administered 2016-05-29: 2 g via INTRAVENOUS
  Filled 2016-05-29: qty 100

## 2016-05-29 MED ORDER — SUGAMMADEX SODIUM 200 MG/2ML IV SOLN
INTRAVENOUS | Status: AC
  Filled 2016-05-29: qty 2

## 2016-05-29 MED ORDER — LACTATED RINGERS IV SOLN
INTRAVENOUS | Status: DC
  Administered 2016-05-29 (×2): via INTRAVENOUS

## 2016-05-29 MED ORDER — ONDANSETRON HCL 4 MG/2ML IJ SOLN
INTRAMUSCULAR | Status: AC
  Filled 2016-05-29: qty 2

## 2016-05-29 MED ORDER — 0.9 % SODIUM CHLORIDE (POUR BTL) OPTIME
TOPICAL | Status: DC | PRN
Start: 2016-05-29 — End: 2016-05-29
  Administered 2016-05-29: 1000 mL

## 2016-05-29 MED ORDER — BUPIVACAINE HCL (PF) 0.5 % IJ SOLN
INTRAMUSCULAR | Status: AC
  Filled 2016-05-29: qty 30

## 2016-05-29 MED ORDER — LIDOCAINE 2% (20 MG/ML) 5 ML SYRINGE
INTRAMUSCULAR | Status: AC
Start: 2016-05-29 — End: ?
  Filled 2016-05-29: qty 5

## 2016-05-29 MED ORDER — HYDROMORPHONE HCL 1 MG/ML IJ SOLN
INTRAMUSCULAR | Status: AC
  Filled 2016-05-29: qty 0.5

## 2016-05-29 MED ORDER — BUPIVACAINE-EPINEPHRINE (PF) 0.5% -1:200000 IJ SOLN
INTRAMUSCULAR | Status: DC | PRN
  Administered 2016-05-29: 30 mL via PERINEURAL

## 2016-05-29 MED ORDER — MIDAZOLAM HCL 2 MG/2ML IJ SOLN
INTRAMUSCULAR | Status: DC | PRN
  Administered 2016-05-29: 2 mg via INTRAVENOUS

## 2016-05-29 MED ORDER — BUPIVACAINE HCL (PF) 0.5 % IJ SOLN
INTRAMUSCULAR | Status: DC | PRN
  Administered 2016-05-29: 10 mL

## 2016-05-29 MED ORDER — MIDAZOLAM HCL 2 MG/2ML IJ SOLN
2.0000 mg | Freq: Once | INTRAMUSCULAR | Status: AC
  Administered 2016-05-29: 2 mg via INTRAVENOUS

## 2016-05-29 MED ORDER — LIDOCAINE 2% (20 MG/ML) 5 ML SYRINGE
INTRAMUSCULAR | Status: DC | PRN
  Administered 2016-05-29: 100 mg via INTRAVENOUS

## 2016-05-29 MED ORDER — PROPOFOL 10 MG/ML IV BOLUS
INTRAVENOUS | Status: AC
  Filled 2016-05-29: qty 20

## 2016-05-29 MED ORDER — FENTANYL CITRATE (PF) 250 MCG/5ML IJ SOLN
INTRAMUSCULAR | Status: AC
  Filled 2016-05-29: qty 5

## 2016-05-29 MED ORDER — PROPOFOL 10 MG/ML IV BOLUS
INTRAVENOUS | Status: DC | PRN
  Administered 2016-05-29: 200 mg via INTRAVENOUS

## 2016-05-29 MED ORDER — DEXAMETHASONE SODIUM PHOSPHATE 10 MG/ML IJ SOLN
INTRAMUSCULAR | Status: AC
Start: 2016-05-29 — End: ?
  Filled 2016-05-29: qty 1

## 2016-05-29 MED ORDER — CHLORHEXIDINE GLUCONATE 4 % EX LIQD: 60.0000 mL | Freq: Once | CUTANEOUS | Status: DC

## 2016-05-29 MED ORDER — FENTANYL CITRATE (PF) 100 MCG/2ML IJ SOLN
100.0000 ug | Freq: Once | INTRAMUSCULAR | Status: AC
  Administered 2016-05-29: 100 ug via INTRAVENOUS

## 2016-05-29 MED ORDER — MIDAZOLAM HCL 2 MG/2ML IJ SOLN
INTRAMUSCULAR | Status: AC
  Administered 2016-05-29: 2 mg via INTRAVENOUS
  Filled 2016-05-29: qty 2

## 2016-05-29 MED ORDER — PHENYLEPHRINE 40 MCG/ML (10ML) SYRINGE FOR IV PUSH (FOR BLOOD PRESSURE SUPPORT)
PREFILLED_SYRINGE | INTRAVENOUS | Status: DC | PRN
  Administered 2016-05-29 (×4): 80 ug via INTRAVENOUS

## 2016-05-29 MED ORDER — DEXAMETHASONE SODIUM PHOSPHATE 10 MG/ML IJ SOLN
INTRAMUSCULAR | Status: DC | PRN
  Administered 2016-05-29: 10 mg via INTRAVENOUS

## 2016-05-29 MED ORDER — STERILE WATER FOR IRRIGATION IR SOLN
Status: DC | PRN
Start: 2016-05-29 — End: 2016-05-29
  Administered 2016-05-29: 1000 mL

## 2016-05-29 MED ORDER — HYDROMORPHONE HCL 1 MG/ML IJ SOLN
0.2500 mg | INTRAMUSCULAR | Status: DC | PRN
  Administered 2016-05-29 (×2): 0.5 mg via INTRAVENOUS

## 2016-05-29 MED ORDER — LIDOCAINE HCL 2 % IJ SOLN
INTRAMUSCULAR | Status: AC
  Filled 2016-05-29: qty 20

## 2016-05-29 MED ORDER — ONDANSETRON HCL 4 MG/2ML IJ SOLN
INTRAMUSCULAR | Status: DC | PRN
  Administered 2016-05-29: 4 mg via INTRAVENOUS

## 2016-05-29 MED ORDER — LIDOCAINE 2% (20 MG/ML) 5 ML SYRINGE
INTRAMUSCULAR | Status: AC
  Filled 2016-05-29: qty 5

## 2016-05-29 MED ORDER — FENTANYL CITRATE (PF) 100 MCG/2ML IJ SOLN
INTRAMUSCULAR | Status: AC
  Administered 2016-05-29: 100 ug via INTRAVENOUS
  Filled 2016-05-29: qty 2

## 2016-05-29 MED ORDER — FENTANYL CITRATE (PF) 250 MCG/5ML IJ SOLN
INTRAMUSCULAR | Status: DC | PRN
  Administered 2016-05-29 (×5): 25 ug via INTRAVENOUS

## 2016-05-29 MED ORDER — MIDAZOLAM HCL 2 MG/2ML IJ SOLN
INTRAMUSCULAR | Status: AC
Start: 2016-05-29 — End: ?
  Filled 2016-05-29: qty 2

## 2016-05-29 SURGICAL SUPPLY — 51 items
BANDAGE ACE 3X5.8 VEL STRL LF (GAUZE/BANDAGES/DRESSINGS) IMPLANT
BANDAGE ACE 4X5 VEL STRL LF (GAUZE/BANDAGES/DRESSINGS) ×2 IMPLANT
BLADE CARPAL TUNNEL SNGL USE (BLADE) ×2 IMPLANT
BNDG ESMARK 4X9 LF (GAUZE/BANDAGES/DRESSINGS) ×2 IMPLANT
BNDG GAUZE ELAST 4 BULKY (GAUZE/BANDAGES/DRESSINGS) ×2 IMPLANT
BUR CUDA 2.9 (BURR) ×2 IMPLANT
BUR FULL RADIUS 2.9 (BURR) ×2 IMPLANT
BUR GATOR 2.9 (BURR) ×2 IMPLANT
BUR SPHERICAL 2.9 (BURR) ×2 IMPLANT
CUFF TOURNIQUET SINGLE 34IN LL (TOURNIQUET CUFF) IMPLANT
DRAPE ARTHROSCOPY W/POUCH 114 (DRAPES) ×2 IMPLANT
DRAPE OEC MINIVIEW 54X84 (DRAPES) IMPLANT
DRAPE SURG 17X23 STRL (DRAPES) ×2 IMPLANT
DRAPE U-SHAPE 47X51 STRL (DRAPES) ×2 IMPLANT
DRSG PAD ABDOMINAL 8X10 ST (GAUZE/BANDAGES/DRESSINGS) ×2 IMPLANT
DURAPREP 26ML APPLICATOR (WOUND CARE) ×2 IMPLANT
ELECT REM PT RETURN 9FT ADLT (ELECTROSURGICAL) ×2
ELECTRODE REM PT RTRN 9FT ADLT (ELECTROSURGICAL) ×1 IMPLANT
GAUZE SPONGE 4X4 12PLY STRL (GAUZE/BANDAGES/DRESSINGS) ×2 IMPLANT
GAUZE SPONGE 4X4 16PLY XRAY LF (GAUZE/BANDAGES/DRESSINGS) ×2 IMPLANT
GAUZE XEROFORM 1X8 LF (GAUZE/BANDAGES/DRESSINGS) ×2 IMPLANT
GLOVE BIO SURGEON STRL SZ7.5 (GLOVE) ×4 IMPLANT
GLOVE BIO SURGEON STRL SZ8 (GLOVE) IMPLANT
GLOVE BIOGEL PI IND STRL 8 (GLOVE) IMPLANT
GLOVE BIOGEL PI INDICATOR 8 (GLOVE)
GOWN STRL REUS W/ TWL LRG LVL3 (GOWN DISPOSABLE) ×1 IMPLANT
GOWN STRL REUS W/ TWL XL LVL3 (GOWN DISPOSABLE) ×1 IMPLANT
GOWN STRL REUS W/TWL LRG LVL3 (GOWN DISPOSABLE) ×1
GOWN STRL REUS W/TWL XL LVL3 (GOWN DISPOSABLE) ×1
KIT BASIN OR (CUSTOM PROCEDURE TRAY) ×2 IMPLANT
MANIFOLD NEPTUNE II (INSTRUMENTS) ×2 IMPLANT
NDL SAFETY ECLIPSE 18X1.5 (NEEDLE) IMPLANT
NEEDLE HYPO 18GX1.5 SHARP (NEEDLE)
NEEDLE HYPO 25X1 1.5 SAFETY (NEEDLE) IMPLANT
NS IRRIG 1000ML POUR BTL (IV SOLUTION) ×2 IMPLANT
PACK ORTHO EXTREMITY (CUSTOM PROCEDURE TRAY) ×2 IMPLANT
PADDING CAST ABS 4INX4YD NS (CAST SUPPLIES)
PADDING CAST ABS COTTON 4X4 ST (CAST SUPPLIES) IMPLANT
SET ARTHROSCOPY TUBING (MISCELLANEOUS)
SET ARTHROSCOPY TUBING LN (MISCELLANEOUS) IMPLANT
SPLINT FIBERGLASS 4X30 (CAST SUPPLIES) IMPLANT
STAPLER VISISTAT 35W (STAPLE) IMPLANT
STRAP ANKLE FOOT DISTRACTOR (ORTHOPEDIC SUPPLIES) ×2 IMPLANT
SUT ETHILON 4 0 PS 2 18 (SUTURE) IMPLANT
SUT MNCRL AB 3-0 PS2 18 (SUTURE) IMPLANT
SUT MNCRL AB 4-0 PS2 18 (SUTURE) IMPLANT
SUT MON AB 5-0 PS2 18 (SUTURE) IMPLANT
SYR 10ML LL (SYRINGE) IMPLANT
TUBE CONNECTING 20X1/4 (TUBING) ×2 IMPLANT
UNDERPAD 30X30 (UNDERPADS AND DIAPERS) ×2 IMPLANT
WATER STERILE IRR 1000ML POUR (IV SOLUTION) ×2 IMPLANT

## 2016-05-29 NOTE — H&P (View-Only) (Signed)
   HPI:  56 year old otherwise healthy female presents today for follow-up evaluation of left ankle pain as well as chronic plantar fasciitis to the left foot and ankle. Last visit MRI was ordered for the left ankle. Patient presents today to review the MRI results. She states that the ankle feels a little bit better in her CAM boot however she still has pain moreso on the evenings. Patient also has a significant complaint for chronic plantar fasciitis left foot. She states she's been dealing with the plantar fasciitis for several years now. Patient has received multiple injections, physical therapy, and multiple conservative modalities to alleviate symptoms with no avail. All conservative modalities have been unsuccessful in providing any sort of satisfactory alleviation of symptoms for the patient.    Physical Exam: General: The patient is alert and oriented x3 in no acute distress.  Dermatology: Skin is warm, dry and supple bilateral lower extremities. Negative for open lesions or macerations.  Vascular: Palpable pedal pulses bilaterally. No edema or erythema noted. Capillary refill within normal limits.  Neurological: Epicritic and protective threshold grossly intact bilaterally.   Musculoskeletal Exam: Significant pain on palpation to the plantar medial calcaneal tubercle of the left plantar fascia insertion.  Pain on palpation and range of motion also noted to the left ankle joint.  Range of motion within normal limits to all pedal and ankle joints bilateral. Muscle strength 5/5 in all groups bilateral.   Assessment: 1. Osteochondral lesion medial shoulder talus left 2. Inflammatory ankle joint synovitis left 3. Chronic plantar fasciitis left    Plan of Care:  1. Patient was evaluated. MRI was reviewed in detail today. 2. Today we discussed additional conservative versus surgical management of the osteochondral lesion as well as the chronic plantar fasciitis. Weighing out the options  of conservative versus surgical management, the patient opts for surgical management. All patient questions were answered today. No guarantees were expressed or implied. All possible palpitations and details the procedure were explained. 3. Authorization for surgery initiated today. Surgery will consist of ankle arthroscopic synovectomy left. Osteochondral drilling left talus, possibly open. Endoscopic plantar fasciotomy left. Surgery will be performed at [facility]. 4. Return to clinic 1 week postop   [name], DPM [facility]  Dr. [name], [address]                                        [place], [place] [address]                Office [phone]  Fax [phone]

## 2016-05-29 NOTE — Discharge Instructions (Signed)
Regional Anesthesia, Care After Refer to this sheet in the next few weeks. These instructions provide you with information about caring for yourself after your procedure. Your health care provider may also give you more specific instructions. Your treatment has been planned according to current medical practices, but problems sometimes occur. Call your health care provider if you have any problems or questions after your procedure. What can I expect after the procedure? After the procedure, it is common to have:  Sleepiness.  Nausea.  Itching. Follow these instructions at home:  Take over-the-counter and prescription medicines only as told by your health care provider.  Do not drive, exercise, or do any other activities that require coordination for 24 hours or as told by your health care provider. Ask your health care provider when you can return to your usual activities.  Drink enough water to keep your urine clear or pale yellow.  If you had a bandage (dressing) placed over the injection site, only remove it when told to do so by your health care provider. Contact a health care provider if:  You continue to have nausea and vomiting for more than one day.  You develop a rash.  You have trouble urinating Get help right away if:  You have bleeding from the injection site or bleeding under the skin at the injection site.  You have redness, swelling, or pain around your injection site.  You have a fever.  You develop a headache.  You develop new numbness or weakness. This information is not intended to replace advice given to you by your health care provider. Make sure you discuss any questions you have with your health care provider. Document Released: 04/30/2015 Document Revised: 06/14/2015 Document Reviewed: 05/03/2014 Elsevier Interactive Patient Education  2017 [place] Anesthesia, Adult, Care After These instructions provide you with information about  caring for yourself after your procedure. Your health care provider may also give you more specific instructions. Your treatment has been planned according to current medical practices, but problems sometimes occur. Call your health care provider if you have any problems or questions after your procedure. What can I expect after the procedure? After the procedure, it is common to have:  Vomiting.  A sore throat.  Mental slowness. It is common to feel:  Nauseous.  Cold or shivery.  Sleepy.  Tired.  Sore or achy, even in parts of your body where you did not have surgery. Follow these instructions at home: For at least 24 hours after the procedure:   Do not:  Participate in activities where you could fall or become injured.  Drive.  Use heavy machinery.  Drink alcohol.  Take sleeping pills or medicines that cause drowsiness.  Make important decisions or sign legal documents.  Take care of children on your own.  Rest. Eating and drinking   If you vomit, drink water, juice, or soup when you can drink without vomiting.  Drink enough fluid to keep your urine clear or pale yellow.  Make sure you have little or no nausea before eating solid foods.  Follow the diet recommended by your health care provider. General instructions   Have a responsible adult stay with you until you are awake and alert.  Return to your normal activities as told by your health care provider. Ask your health care provider what activities are safe for you.  Take over-the-counter and prescription medicines only as told by your health care provider.  If you smoke, do not  smoke without supervision.  Keep all follow-up visits as told by your health care provider. This is important. Contact a health care provider if:  You continue to have nausea or vomiting at home, and medicines are not helpful.  You cannot drink fluids or start eating again.  You cannot urinate after 8-12 hours.  You  develop a skin rash.  You have fever.  You have increasing redness at the site of your procedure. Get help right away if:  You have difficulty breathing.  You have chest pain.  You have unexpected bleeding.  You feel that you are having a life-threatening or urgent problem. This information is not intended to replace advice given to you by your health care provider. Make sure you discuss any questions you have with your health care provider. Document Released: 04/14/2000 Document Revised: 06/11/2015 Document Reviewed: 12/21/2014 Elsevier Interactive Patient Education  2017 [employer].

## 2016-05-29 NOTE — Anesthesia Procedure Notes (Signed)
Procedure Name: LMA Insertion Date/Time: 05/29/2016 1:37 PM Performed by: Mervyn Gay Pre-anesthesia Checklist: Patient identified, Patient being monitored, Timeout performed, Emergency Drugs available and Suction available Patient Re-evaluated:Patient Re-evaluated prior to inductionOxygen Delivery Method: Circle System Utilized Preoxygenation: Pre-oxygenation with 100% oxygen Intubation Type: IV induction Ventilation: Mask ventilation without difficulty LMA: LMA inserted LMA Size: 3.0 Number of attempts: 1 Placement Confirmation: positive ETCO2 and breath sounds checked- equal and bilateral Tube secured with: Tape Dental Injury: Teeth and Oropharynx as per pre-operative assessment

## 2016-05-29 NOTE — Anesthesia Preprocedure Evaluation (Signed)
Anesthesia Evaluation  Patient identified by MRN, date of birth, ID band Patient awake    Reviewed: Allergy & Precautions, H&P , NPO status , Patient's Chart, lab work & pertinent test results  Airway Mallampati: II  TM Distance: >3 FB Neck ROM: Full    Dental no notable dental hx. (+) Teeth Intact, Dental Advisory Given   Pulmonary neg pulmonary ROS,    Pulmonary exam normal breath sounds clear to auscultation       Cardiovascular hypertension,  Rhythm:Regular Rate:Normal     Neuro/Psych  Headaches, Anxiety Depression    GI/Hepatic Neg liver ROS, GERD  Medicated and Controlled,  Endo/Other  diabetes  Renal/GU negative Renal ROS  negative genitourinary   Musculoskeletal  (+) Arthritis , Osteoarthritis,    Abdominal   Peds  Hematology negative hematology ROS (+)   Anesthesia Other Findings   Reproductive/Obstetrics negative OB ROS                             Anesthesia Physical Anesthesia Plan  ASA: II  Anesthesia Plan: General   Post-op Pain Management:  Regional for Post-op pain   Induction: Intravenous  Airway Management Planned: LMA  Additional Equipment:   Intra-op Plan:   Post-operative Plan: Extubation in OR  Informed Consent: I have reviewed the patients History and Physical, chart, labs and discussed the procedure including the risks, benefits and alternatives for the proposed anesthesia with the patient or authorized representative who has indicated his/her understanding and acceptance.   Dental advisory given  Plan Discussed with: CRNA  Anesthesia Plan Comments:         Anesthesia Quick Evaluation

## 2016-05-29 NOTE — Anesthesia Procedure Notes (Signed)
Anesthesia Regional Block: Popliteal block   Pre-Anesthetic Checklist: ,, timeout performed, Correct Patient, Correct Site, Correct Laterality, Correct Procedure, Correct Position, site marked, Risks and benefits discussed, pre-op evaluation,  At surgeon's request and post-op pain management  Laterality: Left  Prep: Maximum Sterile Barrier Precautions used, chloraprep       Needles:  Injection technique: Single-shot  Needle Type: Echogenic Stimulator Needle     Needle Length: 9cm  Needle Gauge: 21     Additional Needles:   Procedures: ultrasound guided, nerve stimulator,,,,,,   Nerve Stimulator or Paresthesia:  Response: Peroneal,  Response: Tibial,   Additional Responses:   Narrative:  Start time: 05/29/2016 10:50 AM End time: 05/29/2016 11:00 AM Injection made incrementally with aspirations every 5 mL. Anesthesiologist: Roderic Palau  Additional Notes: 2% Lidocaine skin wheel. Saphenous block with 10cc of 0.5% Bupivicaine plain.

## 2016-05-29 NOTE — Transfer of Care (Signed)
Immediate Anesthesia Transfer of Care Note  Patient: Sherlin Sonier Furukawa  Procedure(s) Performed: Procedure(s): ANKLE ARTHROSCOPY (Left) ENDOSCOPIC PLANTAR FASCIOTOMY (Left) OSTEOCHONDRAL DRILLING TALUS (Left)  Patient Location: PACU  Anesthesia Type:GA combined with regional for post-op pain  Level of Consciousness: awake, alert  and oriented  Airway & Oxygen Therapy: Patient Spontanous Breathing and Patient connected to nasal cannula oxygen  Post-op Assessment: Report given to RN and Post -op Vital signs reviewed and stable  Post vital signs: Reviewed and stable  Last Vitals:  Vitals:   05/29/16 1256 05/29/16 1607  BP:    Pulse: 65 79  Resp: 17 20    Last Pain:  Vitals:   05/29/16 1005  PainSc: 3       Patients Stated Pain Goal: 3 (83/77/93 9688)  Complications: No apparent anesthesia complications

## 2016-05-29 NOTE — Interval H&P Note (Signed)
History and Physical Interval Note:  05/29/2016 1:26 PM  Infant Kristin [name]  has presented today for surgery, with the diagnosis of LEFT FOOT PLANTAR FASCIITIS, OSTEOCHONDRAL TALAR DOME LESSION, SYNOVITIS   The various methods of treatment have been discussed with the patient and family. After consideration of risks, benefits and other options for treatment, the patient has consented to  Procedure(s): ANKLE ARTHROSCOPY (Left) ENDOSCOPIC PLANTAR FASCIOTOMY (Left) OSTEOCHONDRAL DRILLING TALUS (Left) as a surgical intervention .  The patient's history has been reviewed, patient examined, no change in status, stable for surgery.  I have reviewed the patient's chart and labs.  Questions were answered to the patient's satisfaction.     [name]

## 2016-05-29 NOTE — Anesthesia Postprocedure Evaluation (Addendum)
Anesthesia Post Note  Patient: Kristin [name]  Procedure(s) Performed: Procedure(s) (LRB): ANKLE ARTHROSCOPY (Left) ENDOSCOPIC PLANTAR FASCIOTOMY (Left) OSTEOCHONDRAL DRILLING TALUS (Left)  Patient location during evaluation: PACU Anesthesia Type: General Level of consciousness: awake and alert and patient cooperative Pain management: pain level controlled Vital Signs Assessment: post-procedure vital signs reviewed and stable Respiratory status: spontaneous breathing and respiratory function stable Cardiovascular status: stable Anesthetic complications: no       Last Vitals:  Vitals:   05/29/16 1637 05/29/16 1645  BP: (!) 132/58   Pulse: 63 71  Resp: 12 14  Temp:      Last Pain:  Vitals:   05/29/16 1645  PainSc: Pierz S

## 2016-06-02 ENCOUNTER — Encounter (HOSPITAL_COMMUNITY): Payer: Self-pay | Admitting: Podiatry

## 2016-06-02 NOTE — Op Note (Signed)
OPERATIVE REPORT Patient name: Kristin [name] MRN: [id number] DOB: Dec 14, 1960  DOS: 05/29/2016  Preop Dx: Ankle joint synovitis left. Osteochondral lesion/defect talus left. Chronic plantar fasciitis left Postop Dx: same  Procedure:  1. Ankle arthroscopic synovectomy left 2. Osteochondral drilling talus left 3. Endoscopic plantar fasciotomy left  Surgeon: [name] DPM  Anesthesia: Gen. anesthesia with popliteal block left lower extremity  Hemostasis: Thigh tourniquet inflated to a pressure of 300 mmHg after esmarch exsanguination   EBL: 10 mL Materials: None Injectables: None Pathology: None  Condition: The patient tolerated the procedure and anesthesia well. No complications noted or reported   Justification for procedure: The patient is a 56 y.o. female who presents today for surgical correction of chronic plantar fasciitis to the left foot as well as ankle joint synovitis and an osteochondral lesion to the medial posterior talar dome as indicated on MRI. All conservative modalities of been unsuccessful in providing any sort of satisfactory alleviation of symptoms with the patient. The patient was told benefits as well as possible side effects of the surgery. The patient consented for surgical correction. The patient consent form was reviewed. All patient questions were answered. No guarantees were expressed or implied. The patient and the surgeon boson the patient consent form with the witness present and placed in the patient's chart.   Procedure in Detail: The patient was brought to the operating room, placed in the operating table in the supine position at which time an aseptic scrub and drape were performed about the patient's respective lower extremity after anesthesia was induced as described above. Attention was then directed to the surgical area where procedure number one commenced.  Procedure #1: Ankle arthroscopic synovectomy left A small 0.5 cm linear  longitudinal skin incision was planned and made about the medial anterior aspect of the patient's left ankle joint. After the small skin incision was made a curved mosquito was utilized for blunt dissection to the ankle joint. The curved mosquito was utilized to perforate the joint capsule and allow access and entry into the ankle. At this time a 2.9 mm ankle arthroscope was inserted in a sweeping motion was utilized to visualize the ankle joint. A small 0.5 cm linear longitudinal skin incision was again made to the anterior lateral aspect of the patient's left ankle joint. Access to the ankle joint was achieved in the same manner as previously mentioned. At this time a 2.9 mm full-radius resector was inserted into the lateral portion and all synovitis and pathological soft tissue to the lateral gutter was debrided away using the shaver as visualized on the arthroscope. After full resection of all pathological soft tissue and synovitis to the lateral gutter and anterior lateral portion of the ankle joint the scope and shaver were removed and switched for synovectomy and debridement of the medial gutter. After extensive debridement of all pathological and soft tissue adhesions contributory to the patient's ankle pain the posterior medial talar defect was visualized where procedure #2 commenced.  Procedure #2: Arthroscopic osteochondral drilling talus left After visualization of the talar defect the arthroscope was held in the lateral portal site while a [employer] MIcroVector Drill guide system was utilized for osteochondral drilling. A 0.062 inch K wire was utilized for the drilling. The defect was drilled multiple times to allow for subchondral bleeding and healing through fibrocartilage. After completion of the subchondral drilling all instrumentation was removed and the portal incision sites were primarily closed using 4-0 Prolene suture.  Procedure #3: Endoscopic  plantar fasciotomy left Another small  portal incision was made approximately 5 cm anterior to the posterior heel and 2 cm cephalad to the plantar heel to allow for entry and access to the plantar fascia left foot. The incision was approximately 0.5 cm long and a curved mosquito was utilized for blunt dissection down to the level of the plantar fascia. A plantar fascial elevator was utilized to free and release the plantar fascia from surrounding soft tissue. The fascial elevator was extended to the lateral aspect of the foot and the skin was tented and another 0.5 cm skin incision was created using a #15 blade. A 4.0 mm arthroscope was inserted into the lateral portal entry site for visualization of the plantar fascia and a carpal tunnel retrograde hooked knife was inserted to the medial portal where the medial one half of the plantar fascia was sharply cut while the foot was held in dorsiflexed position. Release tension was held to the medial one half of the plantar fascia.  All instrumentation was removed and the portal incision sites were primarily closed using 4-0 Prolene suture.  Dry sterile compressive dressings were then applied to all previously mentioned incision sites about the patient's lower extremity. The tourniquet which was used for hemostasis was deflated. All normal neurovascular responses including pink color and warmth returned all the digits of patient's lower extremity.  The patient was then transferred from the operating room to the recovery room having tolerated the procedure and anesthesia well. All vital signs are stable. After a brief stay in the recovery room the patient was  Discharged to their home with adequate prescriptions for analgesia. Verbal as well as written instructions were provided for the patient regarding wound care. The patient is to keep the dressings clean dry and intact until they are to follow surgeon Dr. Daylene Katayama in the office upon discharge.   [name], DPM [facility]  Dr. [name], [address]                                        [place], [place] [address]                Office [phone]  Fax [phone]

## 2016-06-06 ENCOUNTER — Ambulatory Visit: Payer: Managed Care, Other (non HMO)

## 2016-06-06 ENCOUNTER — Ambulatory Visit (INDEPENDENT_AMBULATORY_CARE_PROVIDER_SITE_OTHER): Payer: Managed Care, Other (non HMO)

## 2016-06-06 ENCOUNTER — Encounter (HOSPITAL_COMMUNITY): Payer: Self-pay | Admitting: Podiatry

## 2016-06-06 ENCOUNTER — Encounter: Payer: Self-pay | Admitting: Podiatry

## 2016-06-06 ENCOUNTER — Ambulatory Visit (INDEPENDENT_AMBULATORY_CARE_PROVIDER_SITE_OTHER): Payer: Managed Care, Other (non HMO) | Admitting: Podiatry

## 2016-06-06 DIAGNOSIS — M949 Disorder of cartilage, unspecified: Principal | ICD-10-CM

## 2016-06-06 DIAGNOSIS — S82892D Other fracture of left lower leg, subsequent encounter for closed fracture with routine healing: Secondary | ICD-10-CM | POA: Diagnosis not present

## 2016-06-06 DIAGNOSIS — M899 Disorder of bone, unspecified: Secondary | ICD-10-CM

## 2016-06-06 NOTE — Addendum Note (Signed)
Addendum  created 06/06/16 0849 by Albertha Ghee, MD   Anesthesia Event edited, Anesthesia Staff edited

## 2016-06-07 NOTE — Progress Notes (Signed)
   Subjective:  Patient presents today status post subchondral drilling talus right foot. DOS: 05/29/16. She states she her pain is improving and she is doing well.     Objective/Physical Exam Skin incisions appear to be well coapted with sutures and staples intact. No sign of infectious process noted. No dehiscence. No active bleeding noted. Moderate edema noted to the surgical extremity.  Radiographic Exam:  Orthopedic hardware and osteotomies sites appear to be stable with routine healing.  Assessment: 1. s/p 1 week subchondral drilling talus right foot. DOS: 05/29/16. 2. s/p EPF right   Plan of Care:  1. Patient was evaluated. X-rays reviewed 2. Doing well. 3. Dressing changed. 4. Continue nonweightbearing in CAM boot. 5. Return to clinic in 1 week for suture removal.    [name], DPM [facility]  Dr. [name], [address]                                        [place], [place] [address]                Office [phone]  Fax [phone]

## 2016-06-12 ENCOUNTER — Other Ambulatory Visit: Payer: Self-pay

## 2016-06-12 MED ORDER — ROSUVASTATIN CALCIUM 20 MG PO TABS: 20.0000 mg | ORAL_TABLET | Freq: Every day | ORAL | 2 refills | Status: DC

## 2016-06-13 ENCOUNTER — Ambulatory Visit (INDEPENDENT_AMBULATORY_CARE_PROVIDER_SITE_OTHER): Payer: Managed Care, Other (non HMO) | Admitting: Podiatry

## 2016-06-13 DIAGNOSIS — Z9889 Other specified postprocedural states: Secondary | ICD-10-CM

## 2016-06-14 NOTE — Progress Notes (Signed)
   Subjective:  Patient presents today status post subchondral drilling talus right foot. DOS: 05/29/16. She states she is doing better and is experiencing mild pain. She denies fever or chills.    Objective/Physical Exam Skin incisions appear to be well coapted with sutures and staples intact. No sign of infectious process noted. No dehiscence. No active bleeding noted. Moderate edema noted to the surgical extremity.   Assessment: 1. s/p 1 week subchondral drilling talus right foot. DOS: 05/29/16. 2. s/p EPF right   Plan of Care:  1. Patient was evaluated. 2. Dressing changed. 3. Continue nonweightbearing for 2 more weeks in CAM boot. 4. Return to clinic in 2 weeks.  [name], DPM [facility]  Dr. [name], [address]                                        [place], [place] [address]                Office [phone]  Fax [phone]

## 2016-06-17 ENCOUNTER — Encounter: Payer: Self-pay | Admitting: Podiatry

## 2016-06-24 ENCOUNTER — Encounter: Payer: Self-pay | Admitting: Podiatry

## 2016-06-24 NOTE — Progress Notes (Signed)
Arthroscopic ankle synovectomy left Possible open drilling osteochondral lesion talus Endoscopic planter fasciotomy left

## 2016-06-25 ENCOUNTER — Ambulatory Visit (INDEPENDENT_AMBULATORY_CARE_PROVIDER_SITE_OTHER): Payer: Managed Care, Other (non HMO)

## 2016-06-25 ENCOUNTER — Ambulatory Visit (INDEPENDENT_AMBULATORY_CARE_PROVIDER_SITE_OTHER): Payer: Managed Care, Other (non HMO) | Admitting: Family Medicine

## 2016-06-25 VITALS — BP 122/70 | HR 75 | Temp 98.4°F | Resp 16

## 2016-06-25 DIAGNOSIS — S6991XA Unspecified injury of right wrist, hand and finger(s), initial encounter: Secondary | ICD-10-CM | POA: Diagnosis not present

## 2016-06-25 NOTE — Patient Instructions (Signed)
Xray looks negative per my read.  We will call with the radiology read.  Ice, rest, anti-inflammatories if needed.  Take care  Dr. Lacinda Axon

## 2016-06-25 NOTE — Progress Notes (Signed)
   Subjective:  Patient ID: Kristin [name], female    DOB: 01-Jan-1961  Age: 56 y.o. MRN: [id number]  CC: Wrist injury  HPI:  56 year old female presents with the above complaint.  Patient reports that one week ago she slipped after opening the refrigerator and hit her right wrist. She suddenly had swelling and pain particularly laterally. Pain is moderate in severity. Worse with range of motion. Worse with palpation. No known relieving factors. She's been icing the area. No other associated symptoms. No other complaints at this time.  Social Hx   Social History   Social History  . Marital status: Married    Spouse name: N/A  . Number of children: N/A  . Years of education: N/A   Social History Main Topics  . Smoking status: Never Smoker  . Smokeless tobacco: Never Used  . Alcohol use No  . Drug use: No  . Sexual activity: Yes    Partners: Male    Birth control/ protection: Surgical   Other Topics Concern  . Not on file   Social History Narrative  . No narrative on file    Review of Systems  Constitutional: Negative.   Musculoskeletal:       Right wrist pain.    Objective:  BP 122/70 (BP Location: Left Arm, Patient Position: Sitting, Cuff Size: Large)   Pulse 75   Temp 98.4 F (36.9 C) (Oral)   Resp 16   SpO2 98%   BP/Weight 06/25/2016 05/29/2016 2/86/3817  Systolic BP 711 657 903  Diastolic BP 70 54 74  Wt. (Lbs) - - -  BMI - - -    Physical Exam  Constitutional: She is oriented to person, place, and time. She appears well-developed. No distress.  Pulmonary/Chest: Effort normal. No respiratory distress.  Musculoskeletal:  Wrist (right) - swelling and tenderness laterally. No snuffbox tenderness. Decreased ROM secondary to pain.   Neurological: She is alert and oriented to person, place, and time.  Psychiatric: She has a normal mood and affect.  Vitals reviewed.  Dg Wrist Complete Right  Result Date: 06/25/2016 CLINICAL DATA:  Right wrist pain.  Fall  last night EXAM: RIGHT WRIST - COMPLETE 3+ VIEW COMPARISON:  None. FINDINGS: There is no evidence of fracture or dislocation. There is no evidence of arthropathy or other focal bone abnormality. Soft tissues are unremarkable. IMPRESSION: Negative. Electronically Signed   By: Rolm Baptise M.D.   On: 06/25/2016 11:39   Assessment & Plan:   Problem List Items Addressed This Visit      Other   Injury of right wrist - Primary    New problem. Xray negative.  Supportive care. Rest, ice, elevation, PRN Anti-inflammatories.       Relevant Orders   DG Wrist Complete Right (Completed)      Followup: PRN  Schuyler

## 2016-06-26 DIAGNOSIS — S6991XA Unspecified injury of right wrist, hand and finger(s), initial encounter: Secondary | ICD-10-CM | POA: Insufficient documentation

## 2016-06-26 HISTORY — DX: Unspecified injury of right wrist, hand and finger(s), initial encounter: S69.91XA

## 2016-06-26 NOTE — Assessment & Plan Note (Signed)
New problem. Xray negative.  Supportive care. Rest, ice, elevation, PRN Anti-inflammatories.

## 2016-06-27 ENCOUNTER — Encounter: Payer: Self-pay | Admitting: Podiatry

## 2016-06-27 ENCOUNTER — Ambulatory Visit (INDEPENDENT_AMBULATORY_CARE_PROVIDER_SITE_OTHER): Payer: Managed Care, Other (non HMO)

## 2016-06-27 ENCOUNTER — Ambulatory Visit (INDEPENDENT_AMBULATORY_CARE_PROVIDER_SITE_OTHER): Payer: Managed Care, Other (non HMO) | Admitting: Podiatry

## 2016-06-27 DIAGNOSIS — S82892D Other fracture of left lower leg, subsequent encounter for closed fracture with routine healing: Secondary | ICD-10-CM | POA: Diagnosis not present

## 2016-06-27 DIAGNOSIS — M949 Disorder of cartilage, unspecified: Secondary | ICD-10-CM

## 2016-06-27 DIAGNOSIS — M899 Disorder of bone, unspecified: Secondary | ICD-10-CM | POA: Diagnosis not present

## 2016-06-29 NOTE — Progress Notes (Signed)
   Subjective:  Patient presents today status post subchondral drilling talus right foot. DOS: 05/29/16. She reports improving pain and swelling.   Objective/Physical Exam Skin incisions appear to be well coapted with sutures and staples intact. No sign of infectious process noted. No dehiscence. No active bleeding noted. Moderate edema noted to the surgical extremity.   Assessment: 1. s/p subchondral drilling talus right foot. DOS: 05/29/16. 2. s/p EPF right   Plan of Care:  1. Patient was evaluated. X-rays reviewed. 2. Begin light weightbearing in cam boot. 3. Return to clinic in 3 weeks. If patient feels well she may wear tennis shoes with ankle brace.   [name], DPM [facility]  Dr. [name], [address]                                        [place], [place] [address]                Office [phone]  Fax [phone]

## 2016-07-01 ENCOUNTER — Other Ambulatory Visit: Payer: Self-pay | Admitting: Family Medicine

## 2016-07-03 NOTE — Telephone Encounter (Signed)
Faxed script to [facility].

## 2016-07-05 ENCOUNTER — Encounter: Payer: Self-pay | Admitting: Family Medicine

## 2016-07-18 ENCOUNTER — Ambulatory Visit (INDEPENDENT_AMBULATORY_CARE_PROVIDER_SITE_OTHER): Payer: Self-pay | Admitting: Podiatry

## 2016-07-18 DIAGNOSIS — M949 Disorder of cartilage, unspecified: Secondary | ICD-10-CM

## 2016-07-18 DIAGNOSIS — M899 Disorder of bone, unspecified: Secondary | ICD-10-CM

## 2016-07-18 DIAGNOSIS — S82892D Other fracture of left lower leg, subsequent encounter for closed fracture with routine healing: Secondary | ICD-10-CM

## 2016-07-18 NOTE — Progress Notes (Signed)
   Subjective:  Patient presents today status post endoscopic plantar fasciotomy with ankle arthroscopy and subchondral talar drilling right. DOS: 05/29/2016 patient states that she still feels very good and denies any significant pain in the cam boot. She is been putting slight amount of pressure on the cam boot.    Objective/Physical Exam Skin incisions appear to be well coapted. Minimal edema noted to the right foot and ankle.  Radiographic Exam:  Joint spaces appear preserved with stable surgical sites  Assessment: 1. s/p arthroscopic ankle synovectomy with talar drilling and endoscopic plantar fasciotomy right lower extremity. DOS: 05/29/2016   Plan of Care:  1. Patient was evaluated. X-rays reviewed 2. Today the patient can begin transitioning out of the cam boot into weeks into good supportive tennis shoes 3. Slowly increase weightbearing and activity over the next 4 weeks 4. Return to clinic in 4 weeks   [name], DPM [facility]  Dr. [name], [address]                                        [place], [place] [address]                Office [phone]  Fax [phone]

## 2016-08-15 ENCOUNTER — Ambulatory Visit: Payer: Managed Care, Other (non HMO)

## 2016-08-15 ENCOUNTER — Ambulatory Visit (INDEPENDENT_AMBULATORY_CARE_PROVIDER_SITE_OTHER): Payer: Managed Care, Other (non HMO) | Admitting: Podiatry

## 2016-08-15 ENCOUNTER — Ambulatory Visit (INDEPENDENT_AMBULATORY_CARE_PROVIDER_SITE_OTHER): Payer: Managed Care, Other (non HMO)

## 2016-08-15 DIAGNOSIS — Z9889 Other specified postprocedural states: Secondary | ICD-10-CM

## 2016-08-15 DIAGNOSIS — R600 Localized edema: Secondary | ICD-10-CM

## 2016-08-17 NOTE — Progress Notes (Signed)
   HPI: Patient presents today for follow-up evaluation treatment following an arthroscopic ankle ankle synovectomy with talar drilling and EPF of the left foot. Date of surgery 05/29/2016. Patient states that she's doing a little bit better since the last visit hardware is still experiences some swelling. Patient is wearing tennis shoes today.   Physical Exam: General: The patient is alert and oriented x3 in no acute distress.  Dermatology: Skin is warm, dry and supple bilateral lower extremities. Negative for open lesions or macerations.  Vascular: Palpable pedal pulses bilaterally. No edema or erythema noted. Capillary refill within normal limits.  Neurological: Epicritic and protective threshold grossly intact bilaterally.   Musculoskeletal Exam: There is a moderate edema with tenderness to palpation along the ankle joint of the left lower extremity. Range of motion within normal limits to all pedal and ankle joints bilateral. Muscle strength 5/5 in all groups bilateral.   Radiographic Exam:  Normal osseous mineralization. Joint spaces preserved. No fracture/dislocation/boney destruction.  He still appears to be a small fragment to the medial shoulder of the talar dome. This fragment appears to be nondisplaced.  Assessment: 1. Status post arthroscopic ankle synovectomy with a talar drilling and endoscopic plantar fasciotomy left lower extremity. Date of surgery 05/29/2016   Plan of Care:  1. Patient was evaluated. X-rays reviewed today 2. Today we'll place the patient in an Unna boot for multilayer compression 3. Leave the [employer] on for 1 week at which time she can remove it. 4. Follow-up in 2 weeks for possible antibiotic 2 injection. Patient is going on a cruise in 2 weeks   [name], DPM [facility]  Dr. [name], DPM    [address], [place] [address]                Office [phone]  Fax [phone]

## 2016-08-26 ENCOUNTER — Ambulatory Visit (INDEPENDENT_AMBULATORY_CARE_PROVIDER_SITE_OTHER): Payer: Managed Care, Other (non HMO) | Admitting: Podiatry

## 2016-08-26 DIAGNOSIS — M949 Disorder of cartilage, unspecified: Secondary | ICD-10-CM

## 2016-08-26 DIAGNOSIS — M899 Disorder of bone, unspecified: Secondary | ICD-10-CM

## 2016-08-26 DIAGNOSIS — R600 Localized edema: Secondary | ICD-10-CM

## 2016-08-26 DIAGNOSIS — M659 Synovitis and tenosynovitis, unspecified: Secondary | ICD-10-CM

## 2016-08-30 MED ORDER — BETAMETHASONE SOD PHOS & ACET 6 (3-3) MG/ML IJ SUSP: 3.0000 mg | Freq: Once | INTRAMUSCULAR | Status: DC

## 2016-08-30 NOTE — Progress Notes (Signed)
   HPI: Patient presents today for follow-up evaluation treatment following an arthroscopic ankle ankle synovectomy with talar drilling and EPF of the left foot. Date of surgery 05/29/2016. Patient states that there is some very modest improvement however the ankle is still swollen and painful. Last visit and in a boot multilayer compression wrap was applied and the patient removed it this past Friday. Patient states that the Unna boot compression does help. She also states that the pain has improved since before surgery, however she continues to have some residual pain and tenderness with edema and swelling  Physical Exam: General: The patient is alert and oriented x3 in no acute distress.  Dermatology: Skin is warm, dry and supple bilateral lower extremities. Negative for open lesions or macerations.  Vascular: Palpable pedal pulses bilaterally. No edema or erythema noted. Capillary refill within normal limits.  Neurological: Epicritic and protective threshold grossly intact bilaterally.   Musculoskeletal Exam: There is a moderate edema with tenderness to palpation along the ankle joint of the left lower extremity. Range of motion within normal limits to all pedal and ankle joints bilateral. Muscle strength 5/5 in all groups bilateral.    Assessment: 1. Status post arthroscopic ankle synovectomy with a talar drilling and endoscopic plantar fasciotomy left lower extremity. Date of surgery 05/29/2016 2. Inflammatory ankle synovitis left   Plan of Care:  1. Patient was evaluated.  2. Injection of 0.5 mL Celestone Soluspan injected the patient's left ankle joint 3. Today we had a detailed discussion regarding the patient's management from here on out. At this point due to the recurrent symptoms the patient may require a larger operation which would include open repair of talar dome lesion with coring of the lesion and implantation of talar cartilage allograft. Today we discussed and opened up the  discussion to be continued from here on out as a viable option 4. Return to clinic in 3 months. The patient states that she has a lot of traveling to do over the next several months. The patient is currently going on a cruise and leaves on another cruise in approximately 3 months  Asked patient about her cruise back in August  [name], DPM [facility]  Dr. [name], DPM    [address], [place] [address]                Office [phone]  Fax [phone]

## 2016-09-08 ENCOUNTER — Other Ambulatory Visit: Payer: Self-pay

## 2016-09-08 MED ORDER — ZOLPIDEM TARTRATE 10 MG PO TABS: ORAL_TABLET | ORAL | 0 refills | Status: DC

## 2016-09-08 NOTE — Telephone Encounter (Signed)
Last office visit  05/20/16 Follow up as needed  [facility]

## 2016-09-08 NOTE — Telephone Encounter (Signed)
[facility]

## 2016-09-09 ENCOUNTER — Ambulatory Visit (INDEPENDENT_AMBULATORY_CARE_PROVIDER_SITE_OTHER): Payer: Managed Care, Other (non HMO) | Admitting: Podiatry

## 2016-09-09 DIAGNOSIS — M958 Other specified acquired deformities of musculoskeletal system: Secondary | ICD-10-CM

## 2016-09-09 MED ORDER — IBUPROFEN-FAMOTIDINE 800-26.6 MG PO TABS: 1.0000 | ORAL_TABLET | Freq: Three times a day (TID) | ORAL | 2 refills | Status: DC

## 2016-09-10 ENCOUNTER — Telehealth: Payer: Self-pay | Admitting: *Deleted

## 2016-09-10 NOTE — Telephone Encounter (Signed)
"  I saw Dr. Amalia Hailey yesterday.   He told me to call you to schedule my surgery for my ankle.  Due to his schedule and mine, I would like to schedule it on November 15 if it's available."  November 15 is available.  I will get it scheduled.  You will need to register online with the surgical center.  Instructions are in the brochure that was given to you.  Someone from the surgical center will call you a day or two prior to surgery date with the arrival time.

## 2016-09-10 NOTE — Addendum Note (Signed)
Addendum  created 09/10/16 1418 by Albertha Ghee, MD   Sign clinical note

## 2016-09-11 ENCOUNTER — Telehealth: Payer: Self-pay | Admitting: Podiatry

## 2016-09-11 NOTE — Telephone Encounter (Signed)
Rx has been phoned into [facility]

## 2016-09-11 NOTE — Telephone Encounter (Signed)
Calling in regards to medication request by pt on duexis. Pt stated that our office has sent the request in twice. Requested a call back to receive verbal orders.

## 2016-09-17 NOTE — Progress Notes (Signed)
   HPI:  56 year old female presents to the office today for follow-up evaluation of an arthroscopic ankle synovectomy with osteochondral drilling to the left lower extremity. She still continues to have pain and tenderness to the left ankle. She is unable to walk long distances without pain. She would like to discuss additional treatment optionboth conservative and surgically   Physical Exam: General: The patient is alert and oriented x3 in no acute distress.  Dermatology: Skin is warm, dry and supple bilateral lower extremities. Negative for open lesions or macerations.  Vascular:  Neurovascular status intact.Palpable pedal pulses bilaterally. No  erythema noted. Capillary refill within normal limits.  Neurological: Epicritic and protective threshold grossly intact bilaterally.   Musculoskeletal Exam:  Significant pain on palpation noted to the medial aspect of the patient's left ankle joint. Pain with range of motion to the ankle joint as well. There is some moderate edema also noted to the left ankle joint.   Assessment: 1.  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 Status post ankle arthroscopic synovectomy with talar drilling left. Date of surgery 05/29/2016.   Plan of Care:  1. Patient was evaluated. 2.  Due to the nonprogressive nature and the symptoms we discussed additative versus surgical management. The patient would like to proceed with surgicmanagement. At this moment we will undergo open repair of the osteochondral talar dome lesion to the left lower extremity with open ankle arthrotomy.  3. All possible complications and details of the procedure were explained. All patient questions were answered. No guarantees were expressed or implied.  4. Authorization for surgery initiated today. Surgery will consist of open repair osteochondral talar dome lesion left lower extremity. Open arthrotomy left ankle.  5. Refill prescription for Duexis today  6.  Return to clinic 1 week postop or just prior cruise on November 2 for an ankle joint injection to alleviate symptoms while she is on her cruise   [name], DPM [facility]  Dr. [name], DPM    [address], [place] [address]                Office [phone]  Fax [phone]

## 2016-10-28 ENCOUNTER — Ambulatory Visit: Payer: Managed Care, Other (non HMO) | Admitting: Podiatry

## 2016-11-04 ENCOUNTER — Ambulatory Visit (INDEPENDENT_AMBULATORY_CARE_PROVIDER_SITE_OTHER): Payer: Managed Care, Other (non HMO) | Admitting: Internal Medicine

## 2016-11-04 ENCOUNTER — Encounter: Payer: Self-pay | Admitting: Internal Medicine

## 2016-11-04 VITALS — BP 124/82 | HR 67 | Temp 98.1°F | Wt 170.0 lb

## 2016-11-04 DIAGNOSIS — B9789 Other viral agents as the cause of diseases classified elsewhere: Secondary | ICD-10-CM | POA: Diagnosis not present

## 2016-11-04 DIAGNOSIS — J329 Chronic sinusitis, unspecified: Secondary | ICD-10-CM | POA: Diagnosis not present

## 2016-11-04 MED ORDER — PREDNISONE 10 MG PO TABS: ORAL_TABLET | ORAL | 0 refills | Status: DC

## 2016-11-04 NOTE — Progress Notes (Signed)
HPI  Pt presents to the clinic today with c/o headache, facial pain and pressure, nasal congestion, ear fullness and sore throat. She reports this started 1 week ago. She is blowing clear mucous out of her nose. She denies ear pain or decreased hearing. She denies difficulty swallowing. She denies cough or chest congestion. She denies fever, chills or body aches. She has tried Claritin with minimal relief. She has a history of allergies. She has not had sick contacts.  Review of Systems     Past Medical History:  Diagnosis Date  . Allergy   . Arthritis   . Basal cell carcinoma    face, back, neck, arm  . Chicken pox   . Complication of anesthesia    blood pressure elevated after gastric sleeve surgery in recovery, pt does not want mask over face  . Depression   . Diabetes mellitus without complication ([place])    pre-diabetic currently, was diabetic prior to gastric sleeve surgery   . GERD (gastroesophageal reflux disease)   . Gout   . History of shingles   . Hyperlipidemia   . Hypertension    no problems since losing weight after gastric sleeve surgery  . Migraine   . Obesity   . Osteopenia     Family History  Problem Relation Age of Onset  . Heart disease Mother   . Breast cancer Mother 65  . Arthritis Mother   . Hyperlipidemia Mother   . Heart disease Father   . Lung cancer Father   . Hyperlipidemia Father   . Hypertension Father   . Diabetes Sister   . Diabetes Maternal Aunt   . Diabetes Maternal Uncle   . Diabetes Maternal Grandfather   . Hyperlipidemia Maternal Grandfather   . Arthritis Maternal Grandmother   . Sudden death Cousin   . Diabetes Maternal Aunt   . Colon cancer Neg Hx   . Ovarian cancer Neg Hx     Social History   Social History  . Marital status: Married    Spouse name: N/A  . Number of children: N/A  . Years of education: N/A   Occupational History  . Not on file.   Social History Main Topics  . Smoking status: Never Smoker  .  Smokeless tobacco: Never Used  . Alcohol use No  . Drug use: No  . Sexual activity: Yes    Partners: Male    Birth control/ protection: Surgical   Other Topics Concern  . Not on file   Social History Narrative  . No narrative on file    Allergies  Allergen Reactions  . Tetracyclines & Related Swelling and Rash    FACE SWELL, RASH ON CHEST  . Alcohol Rash    ABDOMINAL CRAMPS, drinking alcohol  . Metoprolol Diarrhea     Constitutional: Positive headache. Denies fatigue, fever or abrupt weight changes.  HEENT:  Positive facial pain, nasal congestion and sore throat. Denies eye redness, ear pain, ringing in the ears, wax buildup, runny nose or bloody nose. Respiratory: Denies cough, difficulty breathing or shortness of breath.  Cardiovascular: Denies chest pain, chest tightness, palpitations or swelling in the hands or feet.   No other specific complaints in a complete review of systems (except as listed in HPI above).  Objective:   BP 124/82   Pulse 67   Temp 98.1 F (36.7 C) (Oral)   Wt 170 lb (77.1 kg)   SpO2 98%   BMI 30.11 kg/m   General: Appears her  stated age, in NAD. HEENT: Head: normal shape and size, mild maxillary sinus tenderness noted; Eyes: sclera white, no icterus, conjunctiva pink; Ears: Tm's gray and intact, normal light reflex; Nose: mucosa boggy and moist, turbinates swollen L>R; Throat/Mouth: + PND. Teeth present, mucosa erythematous and moist, no exudate noted, no lesions or ulcerations noted.  Neck:  No adenopathy noted.  Pulmonary/Chest: Normal effort and positive vesicular breath sounds. No respiratory distress. No wheezes, rales or ronchi noted.       Assessment & Plan:   Viral Sinusitis  Can use a Neti Pot which can be purchased from your local drug store. Continue Claritin She reports she can not use nasal sprays eRx for Pred Taper x 6 days- monitor sugars If worsens, can give RX for Augmentin BID x 10 days, she will call me back if she  notices fever, colored mucous etc  RTC as needed or if symptoms persist. Webb Silversmith, NP

## 2016-11-04 NOTE — Patient Instructions (Signed)

## 2016-11-13 ENCOUNTER — Telehealth: Payer: Self-pay | Admitting: *Deleted

## 2016-11-13 MED ORDER — AMOXICILLIN 500 MG PO TABS: 1000.0000 mg | ORAL_TABLET | Freq: Two times a day (BID) | ORAL | 0 refills | Status: AC

## 2016-11-13 NOTE — Telephone Encounter (Signed)
Copied from [place] #[id number]. Topic: Quick Communication - See Telephone Encounter >> Nov 13, 2016  1:32 PM Cecelia Byars, NT wrote: CRM for notification. See Telephone encounter for:  11/13/16.patient said prednisone is not helping ,would like something else

## 2016-11-13 NOTE — Telephone Encounter (Signed)
Spoke to pt

## 2016-11-13 NOTE — Telephone Encounter (Signed)
Please let her know that I sent a prescription for an antibiotic

## 2016-11-13 NOTE — Telephone Encounter (Signed)
R Baity NP is not in office;Please advise.

## 2016-11-13 NOTE — Telephone Encounter (Signed)
Patient states she seemed to be getting better but when finished the Prednisone - the symptoms came back. Patient is experiencing sore throat, runny nose and slight cough. No fever at this time.  Patient finished the prednisone on Monday- symptoms came back immediately. Patient is leaving for vacation next week and does not want to fly with sinus infection. Chart states if patient does not improve with prednisone an antibiotic would be considered. Patient is still using [facility].

## 2016-11-17 ENCOUNTER — Telehealth: Payer: Self-pay | Admitting: *Deleted

## 2016-11-17 NOTE — Telephone Encounter (Signed)
"  If you could give me a call back.  I'm scheduled to have surgery with Dr. Amalia Hailey on November 15.  I will be out of town next week.  So, I need to make sure everything is all set up."

## 2016-11-18 ENCOUNTER — Ambulatory Visit (INDEPENDENT_AMBULATORY_CARE_PROVIDER_SITE_OTHER): Payer: Managed Care, Other (non HMO) | Admitting: Podiatry

## 2016-11-18 DIAGNOSIS — M659 Synovitis and tenosynovitis, unspecified: Secondary | ICD-10-CM

## 2016-11-18 MED ORDER — BETAMETHASONE SOD PHOS & ACET 6 (3-3) MG/ML IJ SUSP: 3.0000 mg | Freq: Once | INTRAMUSCULAR | Status: DC

## 2016-11-20 NOTE — Telephone Encounter (Signed)
"  If you could please call me back.  Dr. Amalia Hailey has me scheduled for surgery on November 15.  I will be out to the country starting on Saturday and back the Monday before surgery date which will be November 12.  I just want to make sure everything is good and that I don't need to do anything before I leave.  So if you could call me this evening or tomorrow, that would be very helpful.  I am returning your call.  "I was calling to see if there's anything else I needed to do prior to my surgery date because I am going out of town.  I called the surgery center and the girl told me that they did not have me scheduled."  I spoke to her, apparently they must have replaced the information.  We faxed the information to them on September 27.  "I knew it was September when I had called to schedule it."  I sent all the information to them again.  Everything is taken care of, there's nothing else you need to do.  "I am glad because I had made all sorts of arrangements to have this done.  I don't want to come back to any surprises."

## 2016-11-20 NOTE — Progress Notes (Signed)
   HPI:  56 year old female presents to the office today for follow-up evaluation of an arthroscopic ankle synovectomy with osteochondral drilling to the left lower extremity. She states she is here for an injection.   Past Medical History:  Diagnosis Date  . Allergy   . Arthritis   . Basal cell carcinoma    face, back, neck, arm  . Chicken pox   . Complication of anesthesia    blood pressure elevated after gastric sleeve surgery in recovery, pt does not want mask over face  . Depression   . Diabetes mellitus without complication ([place])    pre-diabetic currently, was diabetic prior to gastric sleeve surgery   . GERD (gastroesophageal reflux disease)   . Gout   . History of shingles   . Hyperlipidemia   . Hypertension    no problems since losing weight after gastric sleeve surgery  . Migraine   . Obesity   . Osteopenia      Physical Exam: General: The patient is alert and oriented x3 in no acute distress.  Dermatology: Skin is warm, dry and supple bilateral lower extremities. Negative for open lesions or macerations.  Vascular:  Neurovascular status intact.Palpable pedal pulses bilaterally. No  erythema noted. Capillary refill within normal limits.  Neurological: Epicritic and protective threshold grossly intact bilaterally.   Musculoskeletal Exam:  Pain on palpation noted to the medial aspect of the patient's left ankle joint. Pain with range of motion to the ankle joint as well. There is some moderate edema also noted to the left ankle joint.   Assessment: 1.  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 Status post ankle arthroscopic synovectomy with talar drilling left. Date of surgery 05/29/2016. 3. Left ankle synovitis    Plan of Care:  1. Patient was evaluated. 2. Injection of 0.5 mLs of Celestone Soluspan injected into the left ankle joint. 3. Continue taking Duexis. 4. Return to clinic morning of surgery.  Going on a cruise  November 2.  [name], DPM [facility]  Dr. [name], DPM    [address], [place] [address]                Office [phone]  Fax [phone]

## 2016-12-03 ENCOUNTER — Telehealth: Payer: Self-pay | Admitting: *Deleted

## 2016-12-03 NOTE — Telephone Encounter (Signed)
"  I just called my insurance company and talked to them.  They made it sound like there's another code that can be used for this but they would not give me a straight up answer when I asked them.  Is there another code he can use?"  I am not sure.  I think that if there was, Dr. Amalia Hailey would have changed it to that code.  He's working on an appeal for the surgery.  "I asked them about the appeal and they said it could take up to a month to get that approved.  If he gets it approved in the next few weeks, will I be able to be scheduled.  How booked up is he?"  We will make arrangements to work you in even if we have to do it on a different day.  "Okay, now I have to tell my boss that I will not be taking off and will be coming in to work on tomorrow.  I will also have to cancel my mom's flight because she was coming in to take care of me."  I apologize again.  "I understand, the girl at the insurance company was saying the same thing but it doesn't make it any better."

## 2016-12-03 NOTE — Telephone Encounter (Signed)
"  I'm sorry, I am calling about my wife's surgery.  Is the surgery still scheduled for tomorrow or has it already been canceled?  We will pay some down on it or do whatever we have to do.  She just can't go another year with it hurting like this."  The surgery has been canceled but he can make the time to do it.  He has another case in the morning, so he will be there.  I don't know how much her procedure will be.  You can call the surgical center in the morning and see if they can give you an estimate.  "Okay, we'll call them in the morning.  Thanks for your help."  I called Caren Griffins, scheduler for [facility] and asked her to get someone to call the patient in the morning about paying out of pocket for the procedure.  She said the graft itself costs $4000.  She said she would ask Misty to do an estimate.  I told her the [phone] is the code that isn't covered, the 27610 code is covered.  I called and informed Mrs. Indelicato that someone should call her in the morning regarding an estimate.  She asked if Dr. Amalia Hailey did an appeal and if the surgery was authorized would they be refunded their money.  I told her some insurances will allow a retro transaction but I didn't know if her insurance would allow it.  I told her the cost of the implant.  I told her someone from the surgical center would call but if she wanted to call them that is fine as well.  She said she would call them.   I told her [facility] allows payment arrangements.   She thanked me for my help.  Jocelyn Lamer can you do an estimate for [phone] please?)

## 2016-12-03 NOTE — Telephone Encounter (Signed)
I am calling in regards to your surgery.  Unfortunately we have to cancel your surgery.  Your insurance company will not authorize the surgery.  They said it's experimental and non-investigational.  "I have had this on the books for months.  I can't believe this is happening.  I have made all sorts of arrangements.  I have postponed my retirement.  My mom is flying in.  I'm going to have to cancel her flight.  This is more than I can bear.  So what's the plan?  What are we going to do?"  Dr. Amalia Hailey is going to appeal it.  "Should I call my insurance company?"  I don't think it will help.  Dr. Amalia Hailey has gathered some information and he's going to come up with an appeal letter to send to them.  "Okay, keep me informed about what's going on."  I am sorry.  "This is unbelievable."

## 2016-12-04 NOTE — Telephone Encounter (Signed)
Patient's surgery has been rescheduled to 12/05/2016 per Caren Griffins at [facility].  She is going to pay for the procedure in hopes of being refunded.

## 2016-12-05 ENCOUNTER — Other Ambulatory Visit: Payer: Self-pay | Admitting: Family Medicine

## 2016-12-05 NOTE — Telephone Encounter (Signed)
Printed, signed and faxed.  

## 2016-12-05 NOTE — Telephone Encounter (Signed)
Patient is establishing with Dr. Aundra Dubin in six weeks but she is having surgery and will not be weight baring until after Xmas needs refill on Ambien was sending to Dr. Biagio Quint but he out of office .Last OV with Dr. Lacinda Axon was 6/18 and last she saw NP at [facility]

## 2016-12-05 NOTE — Telephone Encounter (Signed)
30 days with 2 refills.,  I do not fill ambien for 90 days at a time

## 2016-12-09 ENCOUNTER — Encounter: Payer: Managed Care, Other (non HMO) | Admitting: Podiatry

## 2016-12-09 ENCOUNTER — Encounter: Payer: Self-pay | Admitting: Podiatry

## 2016-12-09 DIAGNOSIS — M85671 Other cyst of bone, right ankle and foot: Secondary | ICD-10-CM | POA: Diagnosis not present

## 2016-12-16 ENCOUNTER — Encounter: Payer: Self-pay | Admitting: Podiatry

## 2016-12-16 ENCOUNTER — Ambulatory Visit (INDEPENDENT_AMBULATORY_CARE_PROVIDER_SITE_OTHER): Payer: Managed Care, Other (non HMO) | Admitting: Podiatry

## 2016-12-16 ENCOUNTER — Ambulatory Visit (INDEPENDENT_AMBULATORY_CARE_PROVIDER_SITE_OTHER): Payer: Managed Care, Other (non HMO)

## 2016-12-16 VITALS — BP 145/75 | HR 67 | Temp 98.5°F | Resp 16

## 2016-12-16 DIAGNOSIS — M659 Synovitis and tenosynovitis, unspecified: Secondary | ICD-10-CM | POA: Diagnosis not present

## 2016-12-16 DIAGNOSIS — Z9889 Other specified postprocedural states: Secondary | ICD-10-CM

## 2016-12-18 NOTE — Progress Notes (Signed)
   Subjective:  Patient presents today status post left ankle surgery. DOS: 12/09/16. She states the pain is tolerable rating it at 5/10. She denies fever or chills. She has no new complaints or concerns at this time. Patient is here for further evaluation and treatment.    Past Medical History:  Diagnosis Date  . Allergy   . Arthritis   . Basal cell carcinoma    face, back, neck, arm  . Chicken pox   . Complication of anesthesia    blood pressure elevated after gastric sleeve surgery in recovery, pt does not want mask over face  . Depression   . Diabetes mellitus without complication ([place])    pre-diabetic currently, was diabetic prior to gastric sleeve surgery   . GERD (gastroesophageal reflux disease)   . Gout   . History of shingles   . Hyperlipidemia   . Hypertension    no problems since losing weight after gastric sleeve surgery  . Migraine   . Obesity   . Osteopenia       Objective/Physical Exam Skin incisions appear to be well coapted with sutures and staples intact. No sign of infectious process noted. No dehiscence. No active bleeding noted. Moderate edema noted to the surgical extremity.  Radiographic Exam:  Orthopedic hardware and osteotomies sites appear to be stable with routine healing.  Assessment: 1. s/p left ankle surgery. DOS: 12/09/16   Plan of Care:  1. Patient was evaluated. X-rays reviewed 2. Dressing changed. 3. Continue nonweightbearing in CAM boot. 4. Return to clinic in 1 week for staple removal.    [name], DPM [facility]  Dr. [name], [address]                                        [place], [place] [address]                Office [phone]  Fax [phone]

## 2016-12-19 ENCOUNTER — Encounter: Payer: Managed Care, Other (non HMO) | Admitting: Podiatry

## 2016-12-22 ENCOUNTER — Ambulatory Visit: Payer: Managed Care, Other (non HMO) | Admitting: Podiatry

## 2016-12-26 ENCOUNTER — Ambulatory Visit (INDEPENDENT_AMBULATORY_CARE_PROVIDER_SITE_OTHER): Payer: Managed Care, Other (non HMO) | Admitting: Podiatry

## 2016-12-26 ENCOUNTER — Encounter: Payer: Self-pay | Admitting: Podiatry

## 2016-12-26 DIAGNOSIS — Z9889 Other specified postprocedural states: Secondary | ICD-10-CM

## 2016-12-29 NOTE — Progress Notes (Signed)
   Subjective:  Patient presents today status post left ankle surgery. DOS: 12/09/16.  She states she is doing well overall.  She states the pain is improving.  She has no new complaints at this time.  Patient is here for further evaluation and treatment.    Past Medical History:  Diagnosis Date  . Allergy   . Arthritis   . Basal cell carcinoma    face, back, neck, arm  . Chicken pox   . Complication of anesthesia    blood pressure elevated after gastric sleeve surgery in recovery, pt does not want mask over face  . Depression   . Diabetes mellitus without complication ([place])    pre-diabetic currently, was diabetic prior to gastric sleeve surgery   . GERD (gastroesophageal reflux disease)   . Gout   . History of shingles   . Hyperlipidemia   . Hypertension    no problems since losing weight after gastric sleeve surgery  . Migraine   . Obesity   . Osteopenia       Objective/Physical Exam Skin incisions appear to be well coapted with sutures and staples intact. No sign of infectious process noted. No dehiscence. No active bleeding noted. Moderate edema noted to the surgical extremity.  Assessment: 1. s/p left ankle surgery. DOS: 12/09/16   Plan of Care:  1. Patient was evaluated. 2.  Staples removed. 3.  Orders for physical therapy 3 times per week for 4 weeks placed. 4.  Continue nonweightbearing in cam boot. 5.  Return to clinic in 2 weeks.   [name], DPM [facility]  Dr. [name], [address]                                        [place], [place] [address]                Office [phone]  Fax [phone]

## 2016-12-30 ENCOUNTER — Encounter: Payer: Managed Care, Other (non HMO) | Admitting: Obstetrics and Gynecology

## 2016-12-30 ENCOUNTER — Other Ambulatory Visit: Payer: Self-pay

## 2016-12-30 MED ORDER — ESTRADIOL 1 MG PO TABS: 1.0000 mg | ORAL_TABLET | Freq: Every day | ORAL | 1 refills | Status: DC

## 2017-01-16 ENCOUNTER — Ambulatory Visit (INDEPENDENT_AMBULATORY_CARE_PROVIDER_SITE_OTHER): Payer: Managed Care, Other (non HMO) | Admitting: Podiatry

## 2017-01-16 ENCOUNTER — Encounter: Payer: Self-pay | Admitting: Podiatry

## 2017-01-16 ENCOUNTER — Ambulatory Visit (INDEPENDENT_AMBULATORY_CARE_PROVIDER_SITE_OTHER): Payer: Managed Care, Other (non HMO)

## 2017-01-16 DIAGNOSIS — Z9889 Other specified postprocedural states: Secondary | ICD-10-CM | POA: Diagnosis not present

## 2017-01-16 DIAGNOSIS — M958 Other specified acquired deformities of musculoskeletal system: Secondary | ICD-10-CM

## 2017-01-18 ENCOUNTER — Ambulatory Visit
Admission: EM | Admit: 2017-01-18 | Discharge: 2017-01-18 | Disposition: A | Discharge status: Home / Self Care | Payer: Managed Care, Other (non HMO) | Attending: Emergency Medicine | Admitting: Emergency Medicine

## 2017-01-18 ENCOUNTER — Other Ambulatory Visit: Payer: Self-pay

## 2017-01-18 DIAGNOSIS — R0981 Nasal congestion: Secondary | ICD-10-CM | POA: Diagnosis not present

## 2017-01-18 DIAGNOSIS — R69 Illness, unspecified: Secondary | ICD-10-CM

## 2017-01-18 DIAGNOSIS — J111 Influenza due to unidentified influenza virus with other respiratory manifestations: Secondary | ICD-10-CM

## 2017-01-18 DIAGNOSIS — J029 Acute pharyngitis, unspecified: Secondary | ICD-10-CM

## 2017-01-18 DIAGNOSIS — M791 Myalgia, unspecified site: Secondary | ICD-10-CM | POA: Diagnosis not present

## 2017-01-18 MED ORDER — OSELTAMIVIR PHOSPHATE 75 MG PO CAPS: 75.0000 mg | ORAL_CAPSULE | Freq: Two times a day (BID) | ORAL | 0 refills | Status: DC

## 2017-01-18 MED ORDER — BENZONATATE 100 MG PO CAPS: 100.0000 mg | ORAL_CAPSULE | Freq: Three times a day (TID) | ORAL | 0 refills | Status: DC | PRN

## 2017-01-18 NOTE — ED Triage Notes (Signed)
Patient c/o congestion and sinus pressure x 2 days. Patient states her son came down for christmas and was diagnosed with the flu.

## 2017-01-18 NOTE — ED Provider Notes (Signed)
[facility] ____________________________________________  Time seen: Approximately 0830 AM  I have reviewed the triage vital signs and the nursing notes.   HISTORY  Chief Complaint Nasal Congestion   HPI Kristin [name] is a 56 y.o. female present with husband at bedside for evaluation of runny nose, nasal congestion, scratchy throat, chills, body aches and low-grade fevers that started yesterday morning and quickly worsened.  Patient reports has taken some over-the-counter decongestants as well as Advil with some improvement, no resolution.  States cough is mild.  Reports she was just around her son that was diagnosed with influenza and treated with Tamiflu.  Reports overall continues to eat and drink well.  Denies other aggravating or alleviating factors.  Reports otherwise feels well.  States mild generalized body aches this time.  Denies chest pain, shortness of breath, abdominal pain, dysuria, or rash. Denies recent sickness. Denies recent antibiotic use.      Past Medical History:  Diagnosis Date  . Allergy   . Arthritis   . Basal cell carcinoma    face, back, neck, arm  . Chicken pox   . Complication of anesthesia    blood pressure elevated after gastric sleeve surgery in recovery, pt does not want mask over face  . Depression   . Diabetes mellitus without complication ([place])    pre-diabetic currently, was diabetic prior to gastric sleeve surgery   . GERD (gastroesophageal reflux disease)   . Gout   . History of shingles   . Hyperlipidemia   . Hypertension    no problems since losing weight after gastric sleeve surgery  . Migraine   . Obesity   . Osteopenia     Patient Active Problem List   Diagnosis Date Noted  . Injury of right wrist 06/26/2016  . Preoperative clearance 05/20/2016  . Hyperlipidemia 12/12/2015  . Anxiety 12/12/2015  . Diabetes mellitus type 2, controlled ([place]) 08/09/2014  . Hypertension 07/17/2014  . Insomnia 07/17/2014     Past Surgical History:  Procedure Laterality Date  . ABDOMINAL HYSTERECTOMY    . ANKLE ARTHROSCOPY Left 05/29/2016   Procedure: ANKLE ARTHROSCOPY;  Surgeon: [name], DPM;  Location: [place];  Service: Podiatry;  Laterality: Left;  . APPENDECTOMY    . basal cell removed    . CESAREAN SECTION N/A    2  . CHOLECYSTECTOMY    . cyst removed from wrist    . OSTEOCHONDROMA EXCISION Left 05/29/2016   Procedure: OSTEOCHONDRAL DRILLING TALUS;  Surgeon: [name], DPM;  Location: [place];  Service: Podiatry;  Laterality: Left;  . PLANTAR FASCIA RELEASE Left 05/29/2016   Procedure: ENDOSCOPIC PLANTAR FASCIOTOMY;  Surgeon: [name], DPM;  Location: [place];  Service: Podiatry;  Laterality: Left;  . ROTATOR CUFF REPAIR     lt shoulder  . STOMACH SURGERY     gastric sleeve surgery  . TUBAL LIGATION    . wedge resection of ovary  1982      Current Facility-Administered Medications:  .  betamethasone acetate-betamethasone sodium phosphate (CELESTONE) injection 3 mg, 3 mg, Intramuscular, Once, Evans, Brent M, DPM .  betamethasone acetate-betamethasone sodium phosphate (CELESTONE) injection 3 mg, 3 mg, Intramuscular, Once, Evans, Brent M, DPM .  betamethasone acetate-betamethasone sodium phosphate (CELESTONE) injection 3 mg, 3 mg, Intramuscular, Once, [name], DPM  Current Outpatient Medications:  .  estradiol (ESTRACE) 1 MG tablet, Take 1 tablet (1 mg total) by mouth daily., Disp: 30 tablet, Rfl: 1 .  Ibuprofen-Famotidine (DUEXIS)  800-26.6 MG TABS, Take 1 by mouth three times daily (Patient taking differently: Take 1 tablet by mouth 2 (two) times daily as needed (pain). ), Disp: 90 tablet, Rfl: 1 .  Ibuprofen-Famotidine (DUEXIS) 800-26.6 MG TABS, Take 1 tablet by mouth 3 (three) times daily with meals., Disp: 90 tablet, Rfl: 2 .  Multiple Vitamins-Minerals (EQ MULTIVITAMINS ADULT GUMMY PO), Take 2 tablets by mouth daily., Disp: , Rfl:  .  omeprazole (PRILOSEC) 20 MG capsule,  Take 20 mg by mouth daily as needed (indigestion)., Disp: , Rfl:  .  zolpidem (AMBIEN) 10 MG tablet, TAKE 1 TABLET BY MOUTH AT BEDTIME AS NEEDED SLEEP, Disp: 30 tablet, Rfl: 2 .  benzonatate (TESSALON PERLES) 100 MG capsule, Take 1 capsule (100 mg total) by mouth 3 (three) times daily as needed for cough., Disp: 15 capsule, Rfl: 0 .  Calcium-Vitamin D-Vitamin K (CALCIUM SOFT CHEWS PO), Take 1 tablet by mouth 4 (four) times a week., Disp: , Rfl:  .  oseltamivir (TAMIFLU) 75 MG capsule, Take 1 capsule (75 mg total) by mouth every 12 (twelve) hours., Disp: 10 capsule, Rfl: 0 .  predniSONE (DELTASONE) 10 MG tablet, Take 3 tabs on days 1-2, take 2 tabs on days 3-4, take 1 tab on days 5-6, Disp: 12 tablet, Rfl: 0 .  rosuvastatin (CRESTOR) 20 MG tablet, Take 1 tablet (20 mg total) by mouth daily., Disp: 90 tablet, Rfl: 2  Allergies Tetracyclines & related; Alcohol; and Metoprolol  Family History  Problem Relation Age of Onset  . Heart disease Mother   . Breast cancer Mother 29  . Arthritis Mother   . Hyperlipidemia Mother   . Heart disease Father   . Lung cancer Father   . Hyperlipidemia Father   . Hypertension Father   . Diabetes Sister   . Diabetes Maternal Aunt   . Diabetes Maternal Uncle   . Diabetes Maternal Grandfather   . Hyperlipidemia Maternal Grandfather   . Arthritis Maternal Grandmother   . Sudden death Cousin   . Diabetes Maternal Aunt   . Colon cancer Neg Hx   . Ovarian cancer Neg Hx     Social History Social History   Tobacco Use  . Smoking status: Never Smoker  . Smokeless tobacco: Never Used  Substance Use Topics  . Alcohol use: No  . Drug use: No    Review of Systems Constitutional: As above  Eyes: No visual changes. ENT: AS above.  Cardiovascular: Denies chest pain. Respiratory: Denies shortness of breath. Gastrointestinal: No abdominal pain.  Musculoskeletal: Negative for back pain. Skin: Negative for  rash.  ____________________________________________   PHYSICAL EXAM:  VITAL SIGNS: ED Triage Vitals  Enc Vitals Group     BP 01/18/17 0814 123/63     Pulse Rate 01/18/17 0814 86     Resp -- 18     Temp 01/18/17 0814 98.5 F (36.9 C)     Temp Source 01/18/17 0814 Oral     SpO2 01/18/17 0814 98 %     Weight 01/18/17 0815 175 lb (79.4 kg)     Height 01/18/17 0815 5\' 2"  (1.575 m)     Head Circumference --      Peak Flow --      Pain Score 01/18/17 0815 4     Pain Loc --      Pain Edu? --      Excl. in [place]? --     Constitutional: Alert and oriented. Well appearing and in no acute distress. Eyes:  Conjunctivae are normal. Head: Atraumatic. No sinus tenderness to palpation. No swelling. No erythema.  Ears: no erythema, normal TMs bilaterally.   Nose:Nasal congestion with clear rhinorrhea  Mouth/Throat: Mucous membranes are moist. No pharyngeal erythema. No tonsillar swelling or exudate.  Neck: No stridor.  No cervical spine tenderness to palpation. Hematological/Lymphatic/Immunilogical: No cervical lymphadenopathy. Cardiovascular: Normal rate, regular rhythm. Grossly normal heart sounds.  Good peripheral circulation. Respiratory: Normal respiratory effort.  No retractions. No wheezes, rales or rhonchi. Good air movement.  Musculoskeletal: Ambulatory with steady gait. No cervical, thoracic or lumbar tenderness to palpation. Neurologic:  Normal speech and language. No gait instability. Skin:  Skin appears warm, dry and intact. No rash noted. Psychiatric: Mood and affect are normal. Speech and behavior are normal.  ___________________________________________   LABS (all labs ordered are listed, but only abnormal results are displayed)  Labs Reviewed - No data to display ____________________________________________  RADIOLOGY  No results found. ____________________________________________   PROCEDURES Procedures   INITIAL IMPRESSION / ASSESSMENT AND PLAN / ED  COURSE  Pertinent labs & imaging results that were available during my care of the patient were reviewed by me and considered in my medical decision making (see chart for details).  Well-appearing patient.  No acute distress.  Suspect influenza-like illness.  Encourage rest, fluids, supportive care.  Tamiflu and as needed Tessalon Perles.  Work note given for today and tomorrow.Discussed indication, risks and benefits of medications with patient.  Discussed follow up with Primary care physician this week. Discussed follow up and return parameters including no resolution or any worsening concerns. Patient verbalized understanding and agreed to plan.   ____________________________________________   FINAL CLINICAL IMPRESSION(S) / ED DIAGNOSES  Final diagnoses:  Influenza-like illness     ED Discharge Orders        Ordered    oseltamivir (TAMIFLU) 75 MG capsule  Every 12 hours     01/18/17 0846    benzonatate (TESSALON PERLES) 100 MG capsule  3 times daily PRN     01/18/17 0846       Note: This dictation was prepared with Dragon dictation along with smaller phrase technology. Any transcriptional errors that result from this process are unintentional.         Marylene Land, NP 01/18/17 [phone]

## 2017-01-18 NOTE — Discharge Instructions (Signed)
Take medication as prescribed. Rest. Drink plenty of fluids.  ° °Follow up with your primary care physician this week as needed. Return to Urgent care for new or worsening concerns.  ° °

## 2017-01-19 ENCOUNTER — Ambulatory Visit: Payer: Managed Care, Other (non HMO) | Admitting: Internal Medicine

## 2017-01-22 ENCOUNTER — Encounter: Payer: Self-pay | Admitting: Internal Medicine

## 2017-01-22 ENCOUNTER — Ambulatory Visit (INDEPENDENT_AMBULATORY_CARE_PROVIDER_SITE_OTHER): Payer: No Typology Code available for payment source | Admitting: Internal Medicine

## 2017-01-22 VITALS — BP 130/88 | HR 77 | Temp 98.3°F | Resp 16 | Ht 62.0 in | Wt 175.0 lb

## 2017-01-22 DIAGNOSIS — E785 Hyperlipidemia, unspecified: Secondary | ICD-10-CM

## 2017-01-22 DIAGNOSIS — G4700 Insomnia, unspecified: Secondary | ICD-10-CM

## 2017-01-22 DIAGNOSIS — R7303 Prediabetes: Secondary | ICD-10-CM

## 2017-01-22 MED ORDER — ROSUVASTATIN CALCIUM 20 MG PO TABS: 20.0000 mg | ORAL_TABLET | Freq: Every day | ORAL | 1 refills | Status: DC

## 2017-01-22 MED ORDER — TEMAZEPAM 15 MG PO CAPS: ORAL_CAPSULE | ORAL | 0 refills | Status: DC

## 2017-01-22 NOTE — Patient Instructions (Signed)
Please get fasting labs no food only water and pills x 12 hours F/u in 1 month sooner if needed  Try Temazepam for sleep 15 mg as needed at night week 1, week 2 30 mg (2pills) if needed   Insomnia Insomnia is a sleep disorder that makes it difficult to fall asleep or to stay asleep. Insomnia can cause tiredness (fatigue), low energy, difficulty concentrating, mood swings, and poor performance at work or school. There are three different ways to classify insomnia:  Difficulty falling asleep.  Difficulty staying asleep.  Waking up too early in the morning.  Any type of insomnia can be long-term (chronic) or short-term (acute). Both are common. Short-term insomnia usually lasts for three months or less. Chronic insomnia occurs at least three times a week for longer than three months. What are the causes? Insomnia may be caused by another condition, situation, or substance, such as:  Anxiety.  Certain medicines.  Gastroesophageal reflux disease (GERD) or other gastrointestinal conditions.  Asthma or other breathing conditions.  Restless legs syndrome, sleep apnea, or other sleep disorders.  Chronic pain.  Menopause. This may include hot flashes.  Stroke.  Abuse of alcohol, tobacco, or illegal drugs.  Depression.  Caffeine.  Neurological disorders, such as Alzheimer disease.  An overactive thyroid (hyperthyroidism).  The cause of insomnia may not be known. What increases the risk? Risk factors for insomnia include:  Gender. Women are more commonly affected than men.  Age. Insomnia is more common as you get older.  Stress. This may involve your professional or personal life.  Income. Insomnia is more common in people with lower income.  Lack of exercise.  Irregular work schedule or night shifts.  Traveling between different time zones.  What are the signs or symptoms? If you have insomnia, trouble falling asleep or trouble staying asleep is the main symptom.  This may lead to other symptoms, such as:  Feeling fatigued.  Feeling nervous about going to sleep.  Not feeling rested in the morning.  Having trouble concentrating.  Feeling irritable, anxious, or depressed.  How is this treated? Treatment for insomnia depends on the cause. If your insomnia is caused by an underlying condition, treatment will focus on addressing the condition. Treatment may also include:  Medicines to help you sleep.  Counseling or therapy.  Lifestyle adjustments.  Follow these instructions at home:  Take medicines only as directed by your health care provider.  Keep regular sleeping and waking hours. Avoid naps.  Keep a sleep diary to help you and your health care provider figure out what could be causing your insomnia. Include: ? When you sleep. ? When you wake up during the night. ? How well you sleep. ? How rested you feel the next day. ? Any side effects of medicines you are taking. ? What you eat and drink.  Make your bedroom a comfortable place where it is easy to fall asleep: ? Put up shades or special blackout curtains to block light from outside. ? Use a white noise machine to block noise. ? Keep the temperature cool.  Exercise regularly as directed by your health care provider. Avoid exercising right before bedtime.  Use relaxation techniques to manage stress. Ask your health care provider to suggest some techniques that may work well for you. These may include: ? Breathing exercises. ? Routines to release muscle tension. ? Visualizing peaceful scenes.  Cut back on alcohol, caffeinated beverages, and cigarettes, especially close to bedtime. These can disrupt your sleep.  Do not overeat or eat spicy foods right before bedtime. This can lead to digestive discomfort that can make it hard for you to sleep.  Limit screen use before bedtime. This includes: ? Watching TV. ? Using your smartphone, tablet, and computer.  Stick to a routine.  This can help you fall asleep faster. Try to do a quiet activity, brush your teeth, and go to bed at the same time each night.  Get out of bed if you are still awake after 15 minutes of trying to sleep. Keep the lights down, but try reading or doing a quiet activity. When you feel sleepy, go back to bed.  Make sure that you drive carefully. Avoid driving if you feel very sleepy.  Keep all follow-up appointments as directed by your health care provider. This is important. Contact a health care provider if:  You are tired throughout the day or have trouble in your daily routine due to sleepiness.  You continue to have sleep problems or your sleep problems get worse. Get help right away if:  You have serious thoughts about hurting yourself or someone else. This information is not intended to replace advice given to you by your health care provider. Make sure you discuss any questions you have with your health care provider. Document Released: 01/04/2000 Document Revised: 06/08/2015 Document Reviewed: 10/07/2013 Elsevier Interactive Patient Education  2018 [employer].  Cholesterol Cholesterol is a white, waxy, fat-like substance that is needed by the human body in small amounts. The liver makes all the cholesterol we need. Cholesterol is carried from the liver by the blood through the blood vessels. Deposits of cholesterol (plaques) may build up on blood vessel (artery) walls. Plaques make the arteries narrower and stiffer. Cholesterol plaques increase the risk for heart attack and stroke. You cannot feel your cholesterol level even if it is very high. The only way to know that it is high is to have a blood test. Once you know your cholesterol levels, you should keep a record of the test results. Work with your health care provider to keep your levels in the desired range. What do the results mean?  Total cholesterol is a rough measure of all the cholesterol in your blood.  LDL (low-density  lipoprotein) is the "bad" cholesterol. This is the type that causes plaque to build up on the artery walls. You want this level to be low.  HDL (high-density lipoprotein) is the "good" cholesterol because it cleans the arteries and carries the LDL away. You want this level to be high.  Triglycerides are fat that the body can either burn for energy or store. High levels are closely linked to heart disease. What are the desired levels of cholesterol?  Total cholesterol below 200.  LDL below 100 for people who are at risk, below 70 for people at very high risk.  HDL above 40 is good. A level of 60 or higher is considered to be protective against heart disease.  Triglycerides below 150. How can I lower my cholesterol? Diet Follow your diet program as told by your health care provider.  Choose fish or white meat chicken and [place], roasted or baked. Limit fatty cuts of red meat, fried foods, and processed meats, such as sausage and lunch meats.  Eat lots of fresh fruits and vegetables.  Choose whole grains, beans, pasta, potatoes, and cereals.  Choose olive oil, corn oil, or canola oil, and use only small amounts.  Avoid butter, mayonnaise, shortening, or palm kernel  oils.  Avoid foods with trans fats.  Drink skim or nonfat milk and eat low-fat or nonfat yogurt and cheeses. Avoid whole milk, cream, ice cream, egg yolks, and full-fat cheeses.  Healthier desserts include angel food cake, ginger snaps, animal crackers, hard candy, popsicles, and low-fat or nonfat frozen yogurt. Avoid pastries, cakes, pies, and cookies.  Exercise  Follow your exercise program as told by your health care provider. A regular program: ? Helps to decrease LDL and raise HDL. ? Helps with weight control.  Do things that increase your activity level, such as gardening, walking, and taking the stairs.  Ask your health care provider about ways that you can be more active in your daily  life.  Medicine  Take over-the-counter and prescription medicines only as told by your health care provider. ? Medicine may be prescribed by your health care provider to help lower cholesterol and decrease the risk for heart disease. This is usually done if diet and exercise have failed to bring down cholesterol levels. ? If you have several risk factors, you may need medicine even if your levels are normal.  This information is not intended to replace advice given to you by your health care provider. Make sure you discuss any questions you have with your health care provider. Document Released: 10/01/2000 Document Revised: 08/04/2015 Document Reviewed: 07/07/2015 Elsevier Interactive Patient Education  [employer].

## 2017-01-22 NOTE — Progress Notes (Signed)
   Subjective:  Patient presents today status post left ankle surgery. DOS: 12/09/16.  She states she is doing much better.  She started physical therapy recently and has had a decrease in the swelling.  Patient is here for further evaluation and treatment.    Past Medical History:  Diagnosis Date  . Allergy   . Arthritis   . Basal cell carcinoma    face, back, neck, arm  . Basal cell carcinoma    nose, left cheek  . Chicken pox   . Complication of anesthesia    blood pressure elevated after gastric sleeve surgery in recovery, pt does not want mask over face  . Depression   . Diabetes mellitus without complication ([place])    pre-diabetic currently, was diabetic prior to gastric sleeve surgery   . GERD (gastroesophageal reflux disease)   . Gout   . History of shingles   . Hyperlipidemia   . Hypertension    no problems since losing weight after gastric sleeve surgery  . Insomnia   . Migraine   . Obesity   . Osteopenia       Objective/Physical Exam Skin incisions appear to be well coapted. No sign of infectious process noted. No dehiscence. No active bleeding noted. Moderate edema noted to the surgical extremity.  Assessment: 1. s/p left ankle surgery. DOS: 12/09/16   Plan of Care:  1. Patient was evaluated. 2.  Begin weightbearing in cam boot. 3.  Continue physical therapy 3 times weekly for 4 weeks. 4.  Return to clinic in 4 weeks.   [name], DPM [facility]  Dr. [name], [address]                                        [place], [place] [address]                Office [phone]  Fax [phone]

## 2017-01-22 NOTE — Progress Notes (Addendum)
Chief Complaint  Patient presents with  . Establish Care   Establish  1. HLD she has been out of Crestor x 1 month and needs refill  2. C/o sleep maintenance issues Trazadone in the past did not help nor Lunesta. She is on Ambien 10 mg qhs disc rec dose  Women 5 mg and she reports only gets 6 hours sleep with Ambien and still waking up  3. Had left foot surgery x 2 with Dr. Amalia Hailey last surgery 12/09/16 and in boot  4. dx'ed flu Sunday and had tamiflu x 5 days.     Review of Systems  Constitutional: Negative for fever.  HENT: Negative for hearing loss.   Eyes:       No vision problems   Respiratory: Negative for shortness of breath.   Cardiovascular: Negative for chest pain.  Gastrointestinal: Negative for abdominal pain.  Musculoskeletal: Positive for joint pain.       +left foot  Skin: Negative for rash.  Neurological: Negative for headaches.  Psychiatric/Behavioral: Negative for memory loss.   Past Medical History:  Diagnosis Date  . Allergy   . Arthritis   . Basal cell carcinoma    face, back, neck, arm  . Chicken pox   . Complication of anesthesia    blood pressure elevated after gastric sleeve surgery in recovery, pt does not want mask over face  . Depression   . Diabetes mellitus without complication ([place])    pre-diabetic currently, was diabetic prior to gastric sleeve surgery   . GERD (gastroesophageal reflux disease)   . Gout   . History of shingles   . Hyperlipidemia   . Hypertension    no problems since losing weight after gastric sleeve surgery  . Migraine   . Obesity   . Osteopenia    Past Surgical History:  Procedure Laterality Date  . ABDOMINAL HYSTERECTOMY    . ANKLE ARTHROSCOPY Left 05/29/2016   Procedure: ANKLE ARTHROSCOPY;  Surgeon: [name], DPM;  Location: [facility];  Service: Podiatry;  Laterality: Left;  . APPENDECTOMY    . basal cell removed    . CESAREAN SECTION N/A    2  . CHOLECYSTECTOMY    . cyst removed from wrist    .  OSTEOCHONDROMA EXCISION Left 05/29/2016   Procedure: OSTEOCHONDRAL DRILLING TALUS;  Surgeon: [name], DPM;  Location: [facility];  Service: Podiatry;  Laterality: Left;  . PLANTAR FASCIA RELEASE Left 05/29/2016   Procedure: ENDOSCOPIC PLANTAR FASCIOTOMY;  Surgeon: [name], DPM;  Location: [facility];  Service: Podiatry;  Laterality: Left;  . ROTATOR CUFF REPAIR     lt shoulder  . STOMACH SURGERY     gastric sleeve surgery  . TUBAL LIGATION    . wedge resection of ovary  1982   Family History  Problem Relation Age of Onset  . Heart disease Mother   . Breast cancer Mother 48  . Arthritis Mother   . Hyperlipidemia Mother   . Heart disease Father   . Lung cancer Father   . Hyperlipidemia Father   . Hypertension Father   . Diabetes Sister   . Diabetes Maternal Aunt   . Diabetes Maternal Uncle   . Diabetes Maternal Grandfather   . Hyperlipidemia Maternal Grandfather   . Arthritis Maternal Grandmother   . Sudden death Cousin   . Diabetes Maternal Aunt   . Colon cancer Neg Hx   . Ovarian cancer Neg Hx    Social History  Socioeconomic History  . Marital status: Married    Spouse name: Not on file  . Number of children: Not on file  . Years of education: Not on file  . Highest education level: Not on file  Social Needs  . Financial resource strain: Not on file  . Food insecurity - worry: Not on file  . Food insecurity - inability: Not on file  . Transportation needs - medical: Not on file  . Transportation needs - non-medical: Not on file  Occupational History  . Not on file  Tobacco Use  . Smoking status: Never Smoker  . Smokeless tobacco: Never Used  Substance and Sexual Activity  . Alcohol use: No  . Drug use: No  . Sexual activity: Yes    Partners: Male    Birth control/protection: Surgical  Other Topics Concern  . Not on file  Social History Narrative  . Not on file   Current Meds  Medication Sig  . benzonatate (TESSALON PERLES) 100 MG capsule Take 1  capsule (100 mg total) by mouth 3 (three) times daily as needed for cough.  . estradiol (ESTRACE) 1 MG tablet Take 1 tablet (1 mg total) by mouth daily.  . Ibuprofen-Famotidine (DUEXIS) 800-26.6 MG TABS Take 1 by mouth three times daily (Patient taking differently: Take 1 tablet by mouth 2 (two) times daily as needed (pain). )  . omeprazole (PRILOSEC) 20 MG capsule Take 20 mg by mouth daily as needed (indigestion).  . [DISCONTINUED] zolpidem (AMBIEN) 10 MG tablet TAKE 1 TABLET BY MOUTH AT BEDTIME AS NEEDED SLEEP   Current Facility-Administered Medications for the 01/22/17 encounter (Office Visit) with McLean-Scocuzza, Nino Glow, MD  Medication  . betamethasone acetate-betamethasone sodium phosphate (CELESTONE) injection 3 mg  . betamethasone acetate-betamethasone sodium phosphate (CELESTONE) injection 3 mg  . betamethasone acetate-betamethasone sodium phosphate (CELESTONE) injection 3 mg   Allergies  Allergen Reactions  . Tetracyclines & Related Swelling and Rash    FACE SWELL, RASH ON CHEST  . Alcohol Rash    ABDOMINAL CRAMPS, drinking alcohol  . Metoprolol Diarrhea   No results found for this or any previous visit (from the past 2160 hour(s)). Objective  Body mass index is 32.01 kg/m. Wt Readings from Last 3 Encounters:  01/22/17 175 lb (79.4 kg)  01/18/17 175 lb (79.4 kg)  11/04/16 170 lb (77.1 kg)   Temp Readings from Last 3 Encounters:  01/22/17 98.3 F (36.8 C) (Oral)  01/18/17 98.5 F (36.9 C) (Oral)  12/16/16 98.5 F (36.9 C)   BP Readings from Last 3 Encounters:  01/22/17 130/88  01/18/17 123/63  12/16/16 (!) 145/75   Pulse Readings from Last 3 Encounters:  01/22/17 77  01/18/17 86  12/16/16 67   O2 sat 97% room air   Physical Exam  Constitutional: She is oriented to person, place, and time and well-developed, well-nourished, and in no distress. Vital signs are normal.  HENT:  Head: Normocephalic and atraumatic.  Mouth/Throat: Oropharynx is clear and moist  and mucous membranes are normal.  Eyes: Conjunctivae are normal. Pupils are equal, round, and reactive to light.  Cardiovascular: Normal rate and regular rhythm.  No murmur heard. Pulmonary/Chest: Effort normal and breath sounds normal.  Musculoskeletal:  Left foot boot  Neurological: She is alert and oriented to person, place, and time. Gait normal. Gait normal.  Left foot boot s/p surgery 12/09/16   Skin: Skin is warm, dry and intact.  Psychiatric: Mood, memory, affect and judgment normal.  Nursing note and vitals  reviewed.   Assessment   1. HLD 2. Insomnia  3. S/p left foot surgery  4. dx'ed flu 01/18/17 feeling better  5. HM  Plan  1. Refill Crestor  rec healthy diet choices and exercise given info on HLD up to date   2. D/c Ambien 10 mg qhs change to temazepam 15 mg qhs week 1 and increase to 30 mg qhs prn if needed week 3. F/u Dr. Laymond Purser  4. Completed tamiflu  5.  Had flu and Tdap  Disc shingrix at f/u   Never smoker, chew congratulated  mammo due 05/08/17  Colonoscopy ? [place] age 40 need to get report requested today  -obtained records 07/15/11 colonoscopy Dr. Candace Cruise nl repeat in 10 years  Pap s/p hysterectomy ? Cervix intact has appt Dr. Enzo Bi 1/25 or 02/14/17  Dermatology sees [facility] skin q6 months h/o BCC nose and left cheek s/p Mohs and h/o Aks   Check lipid, UA, hep B/C. Declines STD check. Given [facility] form  Reviewed labs 05/20/16 A1C 5.7 prediabetic.  Declines STD check otherwise than as above   Provider: Dr. Olivia Mackie McLean-Scocuzza-Internal Medicine

## 2017-01-26 ENCOUNTER — Other Ambulatory Visit: Payer: Self-pay | Admitting: Pulmonary Disease

## 2017-01-27 LAB — HEPATITIS B SURFACE ANTIBODY, QUANTITATIVE: HEPATITIS B SURF AB QUANT: 111.9 m[IU]/mL

## 2017-01-27 LAB — LIPID PANEL W/O CHOL/HDL RATIO
Cholesterol, Total: 261 mg/dL — ABNORMAL HIGH (ref 100–199)
HDL: 58 mg/dL (ref >39)
LDL Calculated: 151 mg/dL — ABNORMAL HIGH (ref 0–99)
Triglycerides: 258 mg/dL — ABNORMAL HIGH (ref 0–149)
VLDL CHOLESTEROL CAL: 52 mg/dL — AB (ref 5–40)

## 2017-01-27 LAB — URINALYSIS, ROUTINE W REFLEX MICROSCOPIC
Bilirubin, UA: NEGATIVE
Glucose, UA: NEGATIVE
KETONES UA: NEGATIVE
LEUKOCYTES UA: NEGATIVE
Nitrite, UA: NEGATIVE
PH UA: 6 (ref 5.0–7.5)
PROTEIN UA: NEGATIVE
RBC UA: NEGATIVE
SPEC GRAV UA: 1.02 (ref 1.005–1.030)
Urobilinogen, Ur: 0.2 mg/dL (ref 0.2–1.0)

## 2017-01-27 LAB — HEPATITIS B SURFACE ANTIGEN: Hepatitis B Surface Ag: NEGATIVE

## 2017-01-27 LAB — HEPATITIS B CORE ANTIBODY, TOTAL: Hep B Core Total Ab: NEGATIVE

## 2017-01-27 LAB — HEPATITIS C ANTIBODY (REFLEX)

## 2017-01-27 LAB — HCV COMMENT:

## 2017-02-01 ENCOUNTER — Emergency Department: Payer: No Typology Code available for payment source

## 2017-02-01 ENCOUNTER — Encounter: Payer: Self-pay | Admitting: Emergency Medicine

## 2017-02-01 ENCOUNTER — Emergency Department
Admission: EM | Admit: 2017-02-01 | Discharge: 2017-02-01 | Disposition: A | Discharge status: Home / Self Care | Payer: No Typology Code available for payment source | Attending: Emergency Medicine | Admitting: Emergency Medicine

## 2017-02-01 DIAGNOSIS — F329 Major depressive disorder, single episode, unspecified: Secondary | ICD-10-CM | POA: Insufficient documentation

## 2017-02-01 DIAGNOSIS — R197 Diarrhea, unspecified: Secondary | ICD-10-CM | POA: Insufficient documentation

## 2017-02-01 DIAGNOSIS — R1012 Left upper quadrant pain: Secondary | ICD-10-CM | POA: Insufficient documentation

## 2017-02-01 DIAGNOSIS — Z9049 Acquired absence of other specified parts of digestive tract: Secondary | ICD-10-CM | POA: Insufficient documentation

## 2017-02-01 DIAGNOSIS — E119 Type 2 diabetes mellitus without complications: Secondary | ICD-10-CM | POA: Diagnosis not present

## 2017-02-01 DIAGNOSIS — I10 Essential (primary) hypertension: Secondary | ICD-10-CM | POA: Insufficient documentation

## 2017-02-01 DIAGNOSIS — Z79899 Other long term (current) drug therapy: Secondary | ICD-10-CM | POA: Diagnosis not present

## 2017-02-01 DIAGNOSIS — Z85828 Personal history of other malignant neoplasm of skin: Secondary | ICD-10-CM | POA: Diagnosis not present

## 2017-02-01 DIAGNOSIS — R1110 Vomiting, unspecified: Secondary | ICD-10-CM | POA: Diagnosis present

## 2017-02-01 DIAGNOSIS — R112 Nausea with vomiting, unspecified: Secondary | ICD-10-CM

## 2017-02-01 DIAGNOSIS — R109 Unspecified abdominal pain: Secondary | ICD-10-CM

## 2017-02-01 DIAGNOSIS — F419 Anxiety disorder, unspecified: Secondary | ICD-10-CM | POA: Diagnosis not present

## 2017-02-01 LAB — COMPREHENSIVE METABOLIC PANEL
ALBUMIN: 3.4 g/dL — AB (ref 3.5–5.0)
ALT: 13 U/L — ABNORMAL LOW (ref 14–54)
ANION GAP: 10 (ref 5–15)
AST: 22 U/L (ref 15–41)
Alkaline Phosphatase: 76 U/L (ref 38–126)
BILIRUBIN TOTAL: 0.4 mg/dL (ref 0.3–1.2)
BUN: 16 mg/dL (ref 6–20)
CHLORIDE: 112 mmol/L — AB (ref 101–111)
CO2: 18 mmol/L — ABNORMAL LOW (ref 22–32)
Calcium: 7.3 mg/dL — ABNORMAL LOW (ref 8.9–10.3)
Creatinine, Ser: 0.53 mg/dL (ref 0.44–1.00)
GFR calc Af Amer: >60 mL/min (ref >60)
GLUCOSE: 141 mg/dL — AB (ref 65–99)
POTASSIUM: 3.1 mmol/L — AB (ref 3.5–5.1)
Sodium: 140 mmol/L (ref 135–145)
TOTAL PROTEIN: 6.3 g/dL — AB (ref 6.5–8.1)

## 2017-02-01 LAB — CBC
HEMATOCRIT: 42.4 % (ref 35.0–47.0)
HEMOGLOBIN: 14.3 g/dL (ref 12.0–16.0)
MCH: 29.4 pg (ref 26.0–34.0)
MCHC: 33.7 g/dL (ref 32.0–36.0)
MCV: 87.1 fL (ref 80.0–100.0)
Platelets: 281 10*3/uL (ref 150–440)
RBC: 4.87 MIL/uL (ref 3.80–5.20)
RDW: 13.6 % (ref 11.5–14.5)
WBC: 14.6 10*3/uL — AB (ref 3.6–11.0)

## 2017-02-01 LAB — URINALYSIS, COMPLETE (UACMP) WITH MICROSCOPIC
BACTERIA UA: NONE SEEN
BILIRUBIN URINE: NEGATIVE
Glucose, UA: NEGATIVE mg/dL
KETONES UR: 80 mg/dL — AB
LEUKOCYTES UA: NEGATIVE
NITRITE: NEGATIVE
PROTEIN: NEGATIVE mg/dL
Specific Gravity, Urine: 1.019 (ref 1.005–1.030)
pH: 5 (ref 5.0–8.0)

## 2017-02-01 LAB — LIPASE, BLOOD: LIPASE: 25 U/L (ref 11–51)

## 2017-02-01 MED ORDER — ONDANSETRON HCL 4 MG/2ML IJ SOLN
4.0000 mg | Freq: Once | INTRAMUSCULAR | Status: AC
  Administered 2017-02-01: 4 mg via INTRAVENOUS

## 2017-02-01 MED ORDER — ONDANSETRON HCL 4 MG/2ML IJ SOLN
INTRAMUSCULAR | Status: AC
  Administered 2017-02-01: 4 mg via INTRAVENOUS
  Filled 2017-02-01: qty 2

## 2017-02-01 MED ORDER — ONDANSETRON HCL 4 MG/2ML IJ SOLN: 4.0000 mg | Freq: Once | INTRAMUSCULAR | Status: DC

## 2017-02-01 MED ORDER — ONDANSETRON 4 MG PO TBDP: 4.0000 mg | ORAL_TABLET | Freq: Three times a day (TID) | ORAL | 0 refills | Status: DC | PRN

## 2017-02-01 MED ORDER — LOPERAMIDE HCL 2 MG PO TABS: 2.0000 mg | ORAL_TABLET | Freq: Four times a day (QID) | ORAL | 0 refills | Status: DC | PRN

## 2017-02-01 MED ORDER — SODIUM CHLORIDE 0.9 % IV BOLUS (SEPSIS)
1000.0000 mL | Freq: Once | INTRAVENOUS | Status: AC
  Administered 2017-02-01: 1000 mL via INTRAVENOUS

## 2017-02-01 MED ORDER — KETOROLAC TROMETHAMINE 30 MG/ML IJ SOLN
INTRAMUSCULAR | Status: AC
  Administered 2017-02-01: 30 mg via INTRAVENOUS
  Filled 2017-02-01: qty 1

## 2017-02-01 MED ORDER — LOPERAMIDE HCL 2 MG PO CAPS
ORAL_CAPSULE | ORAL | Status: AC
  Administered 2017-02-01: 2 mg via ORAL
  Filled 2017-02-01: qty 1

## 2017-02-01 MED ORDER — LOPERAMIDE HCL 2 MG PO CAPS
2.0000 mg | ORAL_CAPSULE | Freq: Once | ORAL | Status: AC
  Administered 2017-02-01: 2 mg via ORAL

## 2017-02-01 MED ORDER — KETOROLAC TROMETHAMINE 30 MG/ML IJ SOLN
30.0000 mg | Freq: Once | INTRAMUSCULAR | Status: AC
  Administered 2017-02-01: 30 mg via INTRAVENOUS

## 2017-02-01 MED ORDER — IOPAMIDOL (ISOVUE-300) INJECTION 61%
100.0000 mL | Freq: Once | INTRAVENOUS | Status: AC | PRN
  Administered 2017-02-01: 100 mL via INTRAVENOUS

## 2017-02-01 NOTE — ED Notes (Signed)
ED Provider at bedside. 

## 2017-02-01 NOTE — ED Notes (Signed)
Patient transported to CT 

## 2017-02-01 NOTE — ED Triage Notes (Signed)
Per EMS, patient ate meatball sandwich for lunch today, and the remainder of the sandwich later.  For dinner she ate spaghetti, and at 10 pm started "explosive diarrhea and vomiting".  Pt states she did each about 5 times tonight.  EMS stated when they picked her up, initial BP was 95/66 and en route dipped to 67/43 at which time they gave her a 335mL bolus NS.  Patient corrected to 98/63.  Patient stated she has had some dizziness during these episodes.  As of triage, patient is 145/86.  MD in room.

## 2017-02-01 NOTE — ED Provider Notes (Signed)
[facility] Emergency Department Provider Note   ____________________________________________   First MD Initiated Contact with Patient 02/01/17 0032     (approximate)  I have reviewed the triage vital signs and the nursing notes.   HISTORY  Chief Complaint Diarrhea and Emesis    HPI Kristin [name] is a 57 y.o. female who comes into the hospital today with some vomiting diarrhea and dizziness.  The symptoms started around 9 PM.  She states that it started suddenly.  The patient had a meatballs sob and some spaghetti earlier today but she thinks it might of been due to the meatball subs.  The patient states that she has some abdominal pain is a 4 out of 10 in intensity currently.  It is worse in her left upper quadrant.  The patient states that it was so tight that she could breathe when she was vomiting but it is better now.  Her emesis looked like spaghetti and she had 5 episodes of emesis.  She also states that she had 5 episodes of diarrhea and it was coming out of both ends.  It was brown liquid nonbloody diarrhea.  The patient denies any fevers or sick contacts.  She came into the hospital tonight for evaluation.   Past Medical History:  Diagnosis Date  . Allergy   . Arthritis   . Basal cell carcinoma    face, back, neck, arm  . Basal cell carcinoma    nose, left cheek  . Chicken pox   . Complication of anesthesia    blood pressure elevated after gastric sleeve surgery in recovery, pt does not want mask over face  . Depression   . Diabetes mellitus without complication ([place])    pre-diabetic currently, was diabetic prior to gastric sleeve surgery   . GERD (gastroesophageal reflux disease)   . Gout   . History of shingles   . Hyperlipidemia   . Hypertension    no problems since losing weight after gastric sleeve surgery  . Insomnia   . Migraine   . Obesity   . Osteopenia     Patient Active Problem List   Diagnosis Date Noted  .  Prediabetes 01/22/2017  . Injury of right wrist 06/26/2016  . Preoperative clearance 05/20/2016  . Hyperlipidemia 12/12/2015  . Anxiety 12/12/2015  . Hypertension 07/17/2014  . Insomnia 07/17/2014    Past Surgical History:  Procedure Laterality Date  . ABDOMINAL HYSTERECTOMY    . ANKLE ARTHROSCOPY Left 05/29/2016   Procedure: ANKLE ARTHROSCOPY;  Surgeon: [name], DPM;  Location: [facility];  Service: Podiatry;  Laterality: Left;  . APPENDECTOMY    . basal cell removed    . CESAREAN SECTION N/A    2  . CHOLECYSTECTOMY    . cyst removed from wrist    . FOOT SURGERY     left foot 11/2016 Dr. Laymond Purser   . MOHS SURGERY     x 2 face nose and left cheek   . OSTEOCHONDROMA EXCISION Left 05/29/2016   Procedure: OSTEOCHONDRAL DRILLING TALUS;  Surgeon: [name], DPM;  Location: [facility];  Service: Podiatry;  Laterality: Left;  . PLANTAR FASCIA RELEASE Left 05/29/2016   Procedure: ENDOSCOPIC PLANTAR FASCIOTOMY;  Surgeon: [name], DPM;  Location: [facility];  Service: Podiatry;  Laterality: Left;  . ROTATOR CUFF REPAIR     lt shoulder  . STOMACH SURGERY     gastric sleeve surgery  . TUBAL LIGATION    .  wedge resection of ovary  1982    Prior to Admission medications   Medication Sig Start Date End Date Taking? Authorizing Provider  benzonatate (TESSALON PERLES) 100 MG capsule Take 1 capsule (100 mg total) by mouth 3 (three) times daily as needed for cough. 01/18/17   Marylene Land, NP  Calcium-Vitamin D-Vitamin K (CALCIUM SOFT CHEWS PO) Take 1 tablet by mouth 4 (four) times a week.    [provider]  estradiol (ESTRACE) 1 MG tablet Take 1 tablet (1 mg total) by mouth daily. 12/30/16   Defrancesco, Alanda Slim, MD  Ibuprofen-Famotidine (DUEXIS) 800-26.6 MG TABS Take 1 by mouth three times daily Patient taking differently: Take 1 tablet by mouth 2 (two) times daily as needed (pain).  04/08/16   [name], DPM  loperamide (IMODIUM A-D) 2 MG tablet Take 1 tablet (2 mg  total) by mouth 4 (four) times daily as needed for diarrhea or loose stools. 02/01/17   Loney Hering, MD  Multiple Vitamins-Minerals (EQ MULTIVITAMINS ADULT GUMMY PO) Take 2 tablets by mouth daily.    [provider]  omeprazole (PRILOSEC) 20 MG capsule Take 20 mg by mouth daily as needed (indigestion).    [provider]  ondansetron (ZOFRAN ODT) 4 MG disintegrating tablet Take 1 tablet (4 mg total) by mouth every 8 (eight) hours as needed for nausea or vomiting. 02/01/17   Loney Hering, MD  oseltamivir (TAMIFLU) 75 MG capsule Take 1 capsule (75 mg total) by mouth every 12 (twelve) hours. Patient not taking: Reported on 01/22/2017 01/18/17   Marylene Land, NP  rosuvastatin (CRESTOR) 20 MG tablet Take 1 tablet (20 mg total) by mouth at bedtime. 01/22/17   McLean-Scocuzza, Nino Glow, MD  temazepam (RESTORIL) 15 MG capsule 1 pill qhs prn sleep, week 2 if needed 2 pills qhs prn 01/22/17   McLean-Scocuzza, Nino Glow, MD    Allergies Tetracyclines & related; Alcohol; and Metoprolol  Family History  Problem Relation Age of Onset  . Heart disease Mother   . Breast cancer Mother 6  . Arthritis Mother   . Hyperlipidemia Mother   . Heart disease Father   . Lung cancer Father   . Hyperlipidemia Father   . Hypertension Father   . Diabetes Sister   . Diabetes Maternal Aunt   . Diabetes Maternal Uncle   . Diabetes Maternal Grandfather   . Hyperlipidemia Maternal Grandfather   . Arthritis Maternal Grandmother   . Sudden death Cousin   . Diabetes Maternal Aunt   . Colon cancer Neg Hx   . Ovarian cancer Neg Hx     Social History Social History   Tobacco Use  . Smoking status: Never Smoker  . Smokeless tobacco: Never Used  Substance Use Topics  . Alcohol use: No  . Drug use: No    Review of Systems  Constitutional: No fever/chills Eyes: No visual changes. ENT: No sore throat. Cardiovascular: Denies chest pain. Respiratory: Denies shortness of  breath. Gastrointestinal:  abdominal pain.   nausea,  vomiting.   diarrhea.  No constipation. Genitourinary: Negative for dysuria. Musculoskeletal: Negative for back pain. Skin: Negative for rash. Neurological: Negative for headaches, focal weakness or numbness.   ____________________________________________   PHYSICAL EXAM:  VITAL SIGNS: ED Triage Vitals             02/01/17  0032    Enc Vitals Group     BP 145/86     Pulse 73     Resp 20  Temp 97.6     Temp src      SpO2 98%     Weight      Height      Head Circumference      Peak Flow      Pain Score      Pain Loc      Pain Edu?      Excl. in [place]?     Constitutional: Alert and oriented. Well appearing and in mild distress. Eyes: Conjunctivae are normal. PERRL. EOMI. Head: Atraumatic. Nose: No congestion/rhinnorhea. Mouth/Throat: Mucous membranes are moist.  Oropharynx non-erythematous. Cardiovascular: Normal rate, regular rhythm. Grossly normal heart sounds.  Good peripheral circulation. Respiratory: Normal respiratory effort.  No retractions. Lungs CTAB. Gastrointestinal: Soft with left-sided abdominal pain worse in the left upper quadrant. No distention. Positive bowel sounds Musculoskeletal: No lower extremity tenderness nor edema.   Neurologic:  Normal speech and language.  Skin:  Skin is warm, dry and intact.  Psychiatric: Mood and affect are normal.   ____________________________________________   LABS (all labs ordered are listed, but only abnormal results are displayed)  Labs Reviewed  COMPREHENSIVE METABOLIC PANEL - Abnormal; Notable for the following components:      Result Value   Potassium 3.1 (*)    Chloride 112 (*)    CO2 18 (*)    Glucose, Bld 141 (*)    Calcium 7.3 (*)    Total Protein 6.3 (*)    Albumin 3.4 (*)    ALT 13 (*)    All other components within normal limits  CBC - Abnormal; Notable for the following components:   WBC 14.6 (*)    All other components within normal limits   URINALYSIS, COMPLETE (UACMP) WITH MICROSCOPIC - Abnormal; Notable for the following components:   Color, Urine YELLOW (*)    APPearance CLEAR (*)    Hgb urine dipstick MODERATE (*)    Ketones, ur 80 (*)    Squamous Epithelial / LPF 0-5 (*)    All other components within normal limits  LIPASE, BLOOD   ____________________________________________  EKG  ED ECG REPORT I, Loney Hering, the attending physician, personally viewed and interpreted this ECG.   Date: 02/01/2017  EKG Time: 0030  Rate: 81  Rhythm: normal sinus rhythm  Axis: normal  Intervals:none  ST&T Change: none  ____________________________________________  RADIOLOGY  Ct Abdomen Pelvis W Contrast  Result Date: 02/01/2017 CLINICAL DATA:  Abdominal pain, nausea and vomiting EXAM: CT ABDOMEN AND PELVIS WITH CONTRAST TECHNIQUE: Multidetector CT imaging of the abdomen and pelvis was performed using the standard protocol following bolus administration of intravenous contrast. CONTRAST:  160mL ISOVUE-300 IOPAMIDOL (ISOVUE-300) INJECTION 61% COMPARISON:  01/10/2011 CT abdomen pelvis FINDINGS: Lower chest: No acute abnormality. Hepatobiliary: Hepatic steatosis. Surgical clips at the gallbladder fossa. No biliary dilatation Pancreas: Unremarkable. No pancreatic ductal dilatation or surrounding inflammatory changes. Spleen: Normal in size without focal abnormality. Adrenals/Urinary Tract: Adrenal glands are unremarkable. Kidneys are normal, without renal calculi, focal lesion, or hydronephrosis. Bladder is unremarkable. Stomach/Bowel: Stomach is nonenlarged. Postsurgical changes of the stomach. Diffuse fluid-filled loops of small and large bowel. No wall thickening. Non visualized appendix consistent with history of appendectomy. Probable pill fragments in the right colon Vascular/Lymphatic: No significant vascular findings are present. No enlarged abdominal or pelvic lymph nodes. Reproductive: Status post hysterectomy. No adnexal  masses. Other: Negative for free air or free fluid. Small fat in the umbilicus. Musculoskeletal: No acute or significant osseous findings. IMPRESSION: 1. Diffuse fluid-filled  small and large bowel, most suggestive of mild ileus or enteritis. Negative for a bowel obstruction. 2. Hepatic steatosis. Electronically Signed   By: Donavan Foil M.D.   On: 02/01/2017 03:54    ____________________________________________   PROCEDURES  Procedure(s) performed: None  Procedures  Critical Care performed: No  ____________________________________________   INITIAL IMPRESSION / ASSESSMENT AND PLAN / ED COURSE  As part of my medical decision making, I reviewed the following data within the electronic MEDICAL RECORD NUMBER Notes from prior ED visits and [place] Controlled Substance Database   This is a 57 year old who comes into the hospital today with some nausea vomiting and diarrhea.  The patient also has some abdominal pain.  Differential diagnosis includes food poisoning, gastroenteritis, colitis  I did give the patient a liter of normal saline.  We did check some blood work to include a CBC CMP and a lipase.  The patient's pain is improved at this time.  We will continue to hydrate her and then I will reassess her pain.  If the pain persists I will then do a CT scan of the patient's abdomen and pelvis.  Clinical Course as of Feb 01 450  Sun Feb 01, 2017  9622 I went to reevaluate the patient and she was still having some abdominal pain as well as 3 more episodes of diarrhea.  I will send the patient for a CT scan.  [AW]    Clinical Course User Index [AW] Loney Hering, MD   The patient CT scan returned showing a diffuse fluid-filled small bowel and large bowel most suggestive of ileus or enteritis.  Negative for bowel obstruction.  The patient did receive a second dose of Zofran as well as some Toradol and Imodium.  She states that she feels much better and was able to drink some water without  any vomiting.  The patient will be discharged home to follow-up with her primary care physician.  ____________________________________________   FINAL CLINICAL IMPRESSION(S) / ED DIAGNOSES  Final diagnoses:  Nausea vomiting and diarrhea  Abdominal pain, unspecified abdominal location     ED Discharge Orders        Ordered    ondansetron (ZOFRAN ODT) 4 MG disintegrating tablet  Every 8 hours PRN     02/01/17 0451    loperamide (IMODIUM A-D) 2 MG tablet  4 times daily PRN     02/01/17 0451       Note:  This document was prepared using Dragon voice recognition software and may include unintentional dictation errors.    Loney Hering, MD 02/01/17 [id number]

## 2017-02-01 NOTE — Discharge Instructions (Signed)
Please follow up with your primary care physician for further evaluation °

## 2017-02-01 NOTE — ED Notes (Signed)
Patient given a warm blanket at this time.  

## 2017-02-06 NOTE — Progress Notes (Signed)
Patient ID: Kristin [name], female   DOB: 02-02-1960, 57 y.o.   MRN: [id number] ANNUAL PREVENTATIVE CARE GYN  ENCOUNTER NOTE  Subjective:       Kristin [name] is a 57 y.o. No obstetric history on file. female here for a routine annual gynecologic exam.  Current complaints: 1.  None  Currently taking estradiol 1 mg a day; menopausal symptoms are minimal; she is not consistently taking calcium with vitamin D. Patient did have ankle fracture this past year and had complications which required sedentary lifestyle; she has gained 10 pounds back from her low of 162 pounds last year. Bowel function and bladder function are normal. 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   Gynecologic History No LMP recorded. Patient has had a hysterectomy. Contraception: status post hysterectomy Last Pap: no further paps. Results were: normal Last mammogram: 05/08/2016 birad 1. Results were: normal  Obstetric History OB History  Gravida Para Term Preterm AB Living  3 3 3     3   SAB TAB Ectopic Multiple Live Births          3    # Outcome Date GA Lbr Len/2nd Weight Sex Delivery Anes PTL Lv  3 Term 1995   7 lb (3.175 kg) M CS-LTranv   LIV  2 Term 1988   7 lb (3.175 kg) M Vag-Spont   LIV  1 Term 1985   7 lb (3.175 kg) M CS-LTranv   LIV      Past Medical History:  Diagnosis Date  . Allergy   . Arthritis   . Basal cell carcinoma    face, back, neck, arm  . Basal cell carcinoma    nose, left cheek  . Chicken pox   . Complication of anesthesia    blood pressure elevated after gastric sleeve surgery in recovery, pt does not want mask over face  . Depression   . Diabetes mellitus without complication ([place])    pre-diabetic currently, was diabetic prior to gastric sleeve surgery   . GERD (gastroesophageal reflux disease)   . Gout   . History of shingles   . Hyperlipidemia   . Hypertension    no problems since losing weight after  gastric sleeve surgery  . Insomnia   . Migraine   . Obesity   . Osteopenia     Past Surgical History:  Procedure Laterality Date  . ABDOMINAL HYSTERECTOMY    . ANKLE ARTHROSCOPY Left 05/29/2016   Procedure: ANKLE ARTHROSCOPY;  Surgeon: [name], DPM;  Location: [place];  Service: Podiatry;  Laterality: Left;  . APPENDECTOMY    . basal cell removed    . CESAREAN SECTION N/A    2  . CHOLECYSTECTOMY    . cyst removed from wrist    . FOOT SURGERY     left foot 11/2016 Dr. Laymond Purser   . MOHS SURGERY     x 2 face nose and left cheek   . OSTEOCHONDROMA EXCISION Left 05/29/2016   Procedure: OSTEOCHONDRAL DRILLING TALUS;  Surgeon: [name], DPM;  Location: [place];  Service: Podiatry;  Laterality: Left;  . PLANTAR FASCIA RELEASE Left 05/29/2016   Procedure: ENDOSCOPIC PLANTAR FASCIOTOMY;  Surgeon: [name], DPM;  Location: [facility];  Service: Podiatry;  Laterality: Left;  . ROTATOR CUFF REPAIR     lt shoulder  . STOMACH SURGERY     gastric sleeve surgery  . TUBAL  LIGATION    . wedge resection of ovary  1982    Current Outpatient Medications on File Prior to Visit  Medication Sig Dispense Refill  . benzonatate (TESSALON PERLES) 100 MG capsule Take 1 capsule (100 mg total) by mouth 3 (three) times daily as needed for cough. 15 capsule 0  . Calcium-Vitamin D-Vitamin K (CALCIUM SOFT CHEWS PO) Take 1 tablet by mouth 4 (four) times a week.    . estradiol (ESTRACE) 1 MG tablet Take 1 tablet (1 mg total) by mouth daily. 30 tablet 1  . Ibuprofen-Famotidine (DUEXIS) 800-26.6 MG TABS Take 1 by mouth three times daily (Patient taking differently: Take 1 tablet by mouth 2 (two) times daily as needed (pain). ) 90 tablet 1  . loperamide (IMODIUM A-D) 2 MG tablet Take 1 tablet (2 mg total) by mouth 4 (four) times daily as needed for diarrhea or loose stools. 30 tablet 0  . Multiple Vitamins-Minerals (EQ MULTIVITAMINS ADULT GUMMY PO) Take 2 tablets by mouth daily.    Marland Kitchen omeprazole (PRILOSEC)  20 MG capsule Take 20 mg by mouth daily as needed (indigestion).    . ondansetron (ZOFRAN ODT) 4 MG disintegrating tablet Take 1 tablet (4 mg total) by mouth every 8 (eight) hours as needed for nausea or vomiting. 20 tablet 0  . oseltamivir (TAMIFLU) 75 MG capsule Take 1 capsule (75 mg total) by mouth every 12 (twelve) hours. (Patient not taking: Reported on 01/22/2017) 10 capsule 0  . rosuvastatin (CRESTOR) 20 MG tablet Take 1 tablet (20 mg total) by mouth at bedtime. 90 tablet 1  . temazepam (RESTORIL) 15 MG capsule 1 pill qhs prn sleep, week 2 if needed 2 pills qhs prn 60 capsule 0   Current Facility-Administered Medications on File Prior to Visit  Medication Dose Route Frequency Provider Last Rate Last Dose  . betamethasone acetate-betamethasone sodium phosphate (CELESTONE) injection 3 mg  3 mg Intramuscular Once Daylene Katayama M, DPM      . betamethasone acetate-betamethasone sodium phosphate (CELESTONE) injection 3 mg  3 mg Intramuscular Once Daylene Katayama M, DPM      . betamethasone acetate-betamethasone sodium phosphate (CELESTONE) injection 3 mg  3 mg Intramuscular Once [name], DPM        Allergies  Allergen Reactions  . Tetracyclines & Related Swelling and Rash    FACE SWELL, RASH ON CHEST  . Alcohol Rash    ABDOMINAL CRAMPS, drinking alcohol  . Metoprolol Diarrhea    Social History   Socioeconomic History  . Marital status: Married    Spouse name: Not on file  . Number of children: Not on file  . Years of education: Not on file  . Highest education level: Not on file  Social Needs  . Financial resource strain: Not on file  . Food insecurity - worry: Not on file  . Food insecurity - inability: Not on file  . Transportation needs - medical: Not on file  . Transportation needs - non-medical: Not on file  Occupational History  . Not on file  Tobacco Use  . Smoking status: Never Smoker  . Smokeless tobacco: Never Used  Substance and Sexual Activity  . Alcohol use:  No  . Drug use: No  . Sexual activity: Yes    Partners: Male    Birth control/protection: Surgical  Other Topics Concern  . Not on file  Social History Narrative   Married x 37 years as of 01/2017    Kids    Write childrens  books    Retiring from [employer] 01/2017     Family History  Problem Relation Age of Onset  . Heart disease Mother   . Breast cancer Mother 85  . Arthritis Mother   . Hyperlipidemia Mother   . Heart disease Father   . Lung cancer Father   . Hyperlipidemia Father   . Hypertension Father   . Diabetes Sister   . Diabetes Maternal Aunt   . Diabetes Maternal Uncle   . Diabetes Maternal Grandfather   . Hyperlipidemia Maternal Grandfather   . Arthritis Maternal Grandmother   . Sudden death Cousin   . Diabetes Maternal Aunt   . Colon cancer Neg Hx   . Ovarian cancer Neg Hx     The following portions of the patient's history were reviewed and updated as appropriate: allergies, current medications, past family history, past medical history, past social history, past surgical history and problem list.  Review of Systems Review of Systems  Constitutional:       Rare vasomotor symptom on ERT  HENT: Negative.   Eyes: Negative.   Respiratory: Negative.   Cardiovascular: Negative.   Gastrointestinal: Negative.   Genitourinary: Negative.   Musculoskeletal:       Recovering from left ankle surgery and bone graft  Skin: Negative.   Neurological: Negative.   Endo/Heme/Allergies: Negative.   Psychiatric/Behavioral: Negative.      Objective:   BP (!) 164/83   Pulse 80   Ht 5\' 2"  (1.575 m)   Wt 172 lb 6.4 oz (78.2 kg)   BMI 31.53 kg/m   CONSTITUTIONAL: Well-developed, well-nourished female in no acute distress.  PSYCHIATRIC: Normal mood and affect. Normal behavior. Normal judgment and thought content. [place]: Alert and oriented to person, place, and time. Normal muscle tone coordination. No cranial nerve deficit noted. HENT:  Normocephalic, atraumatic,  External right and left ear normal. Oropharynx is clear and moist EYES: Conjunctivae and EOM are normal. No scleral icterus.  NECK: Normal range of motion, supple, no masses.  Normal thyroid.  SKIN: Skin is warm and dry. No rash noted. Not diaphoretic. No erythema. No pallor. CARDIOVASCULAR: Normal heart rate noted, regular rhythm, no murmur. RESPIRATORY: Clear to auscultation bilaterally. Effort and breath sounds normal, no problems with respiration noted. BREASTS: Symmetric in size. No masses, skin changes, nipple drainage, or lymphadenopathy. ABDOMEN: Soft, normal bowel sounds, no distention noted.  No tenderness, rebound or guarding.  BLADDER: Normal PELVIC:  External Genitalia: Normal  BUS: Normal   Vagina: Good estrogen effect; good vault support  Cervix: Surgically absent  Uterus: Surgically absent  Adnexa: Normal  RV: External hemorrhoids, No Rectal Masses and Normal Sphincter tone  MUSCULOSKELETAL: Normal range of motion. No tenderness.  No cyanosis, clubbing, or edema.  2+ distal pulses. LYMPHATIC: No Axillary, Supraclavicular, or Inguinal Adenopathy.    Assessment:   Annual gynecologic examination 57 y.o. Contraception: status post hysterectomy bmi- 31 Surgical menopause; asymptomatic on ERT Elevated blood pressure   Plan:  Pap: Not needed Mammogram: Ordered Stool Guaiac Testing:  declined Labs:  Thru pcp  Routine preventative health maintenance measures emphasized: Exercise/Diet/Weight control and Alcohol/Substance use risks  Blood pressure diary is encouraged. Calcium and vitamin D supplementation is encouraged Return to [place], CMA  Brayton Mars, MD   Note: This dictation was prepared with Dragon dictation along with smaller phrase technology. Any transcriptional errors that result from this process are unintentional.

## 2017-02-10 ENCOUNTER — Encounter: Payer: Self-pay | Admitting: Podiatry

## 2017-02-10 ENCOUNTER — Ambulatory Visit (INDEPENDENT_AMBULATORY_CARE_PROVIDER_SITE_OTHER): Payer: No Typology Code available for payment source | Admitting: Obstetrics and Gynecology

## 2017-02-10 ENCOUNTER — Encounter: Payer: Self-pay | Admitting: Obstetrics and Gynecology

## 2017-02-10 ENCOUNTER — Ambulatory Visit (INDEPENDENT_AMBULATORY_CARE_PROVIDER_SITE_OTHER): Payer: No Typology Code available for payment source

## 2017-02-10 ENCOUNTER — Ambulatory Visit (INDEPENDENT_AMBULATORY_CARE_PROVIDER_SITE_OTHER): Payer: No Typology Code available for payment source | Admitting: Podiatry

## 2017-02-10 VITALS — BP 164/83 | HR 80 | Ht 62.0 in | Wt 172.4 lb

## 2017-02-10 DIAGNOSIS — N951 Menopausal and female climacteric states: Secondary | ICD-10-CM

## 2017-02-10 DIAGNOSIS — Z1239 Encounter for other screening for malignant neoplasm of breast: Secondary | ICD-10-CM

## 2017-02-10 DIAGNOSIS — N952 Postmenopausal atrophic vaginitis: Secondary | ICD-10-CM | POA: Diagnosis not present

## 2017-02-10 DIAGNOSIS — M659 Synovitis and tenosynovitis, unspecified: Secondary | ICD-10-CM

## 2017-02-10 DIAGNOSIS — R030 Elevated blood-pressure reading, without diagnosis of hypertension: Secondary | ICD-10-CM

## 2017-02-10 DIAGNOSIS — Z780 Asymptomatic menopausal state: Secondary | ICD-10-CM

## 2017-02-10 DIAGNOSIS — Z01419 Encounter for gynecological examination (general) (routine) without abnormal findings: Secondary | ICD-10-CM

## 2017-02-10 DIAGNOSIS — M958 Other specified acquired deformities of musculoskeletal system: Secondary | ICD-10-CM

## 2017-02-10 DIAGNOSIS — Z1231 Encounter for screening mammogram for malignant neoplasm of breast: Secondary | ICD-10-CM | POA: Diagnosis not present

## 2017-02-10 DIAGNOSIS — Z9889 Other specified postprocedural states: Secondary | ICD-10-CM

## 2017-02-10 MED ORDER — ESTRADIOL 1 MG PO TABS: 1.0000 mg | ORAL_TABLET | Freq: Every day | ORAL | 3 refills | Status: DC

## 2017-02-10 NOTE — Progress Notes (Signed)
Left ankl

## 2017-02-10 NOTE — Patient Instructions (Addendum)
1.  No Pap smear needed 2.  Mammogram scheduled 3.  Stool guaiac cards are given for colon cancer screening 4.  Screening labs are to be obtained through primary care 5.  Continue with healthy eating and exercise with control weight loss 6.  Recommend calcium 1200 mg a day and vitamin D 800 international unit 7.  Estradiol 1 mg daily is refilled 8.  Return in 1 year for annual exam 9.  Blood pressure diary is recommended.  If blood pressure remains above 140/90 consistently, follow-up with primary care   Health Maintenance for Postmenopausal Women Menopause is a normal process in which your reproductive ability comes to an end. This process happens gradually over a span of months to years, usually between the ages of 48 and 55. Menopause is complete when you have missed 12 consecutive menstrual periods. It is important to talk with your health care provider about some of the most common conditions that affect postmenopausal women, such as heart disease, cancer, and bone loss (osteoporosis). Adopting a healthy lifestyle and getting preventive care can help to promote your health and wellness. Those actions can also lower your chances of developing some of these common conditions. What should I know about menopause? During menopause, you may experience a number of symptoms, such as:  Moderate-to-severe hot flashes.  Night sweats.  Decrease in sex drive.  Mood swings.  Headaches.  Tiredness.  Irritability.  Memory problems.  Insomnia.  Choosing to treat or not to treat menopausal changes is an individual decision that you make with your health care provider. What should I know about hormone replacement therapy and supplements? Hormone therapy products are effective for treating symptoms that are associated with menopause, such as hot flashes and night sweats. Hormone replacement carries certain risks, especially as you become older. If you are thinking about using estrogen or  estrogen with progestin treatments, discuss the benefits and risks with your health care provider. What should I know about heart disease and stroke? Heart disease, heart attack, and stroke become more likely as you age. This may be due, in part, to the hormonal changes that your body experiences during menopause. These can affect how your body processes dietary fats, triglycerides, and cholesterol. Heart attack and stroke are both medical emergencies. There are many things that you can do to help prevent heart disease and stroke:  Have your blood pressure checked at least every 1-2 years. High blood pressure causes heart disease and increases the risk of stroke.  If you are 55-79 years old, ask your health care provider if you should take aspirin to prevent a heart attack or a stroke.  Do not use any tobacco products, including cigarettes, chewing tobacco, or electronic cigarettes. If you need help quitting, ask your health care provider.  It is important to eat a healthy diet and maintain a healthy weight. ? Be sure to include plenty of vegetables, fruits, low-fat dairy products, and lean protein. ? Avoid eating foods that are high in solid fats, added sugars, or salt (sodium).  Get regular exercise. This is one of the most important things that you can do for your health. ? Try to exercise for at least 150 minutes each week. The type of exercise that you do should increase your heart rate and make you sweat. This is known as moderate-intensity exercise. ? Try to do strengthening exercises at least twice each week. Do these in addition to the moderate-intensity exercise.  Know your numbers.Ask your health care provider   to check your cholesterol and your blood glucose. Continue to have your blood tested as directed by your health care provider.  What should I know about cancer screening? There are several types of cancer. Take the following steps to reduce your risk and to catch any cancer  development as early as possible. Breast Cancer  Practice breast self-awareness. ? This means understanding how your breasts normally appear and feel. ? It also means doing regular breast self-exams. Let your health care provider know about any changes, no matter how small.  If you are 40 or older, have a clinician do a breast exam (clinical breast exam or CBE) every year. Depending on your age, family history, and medical history, it may be recommended that you also have a yearly breast X-ray (mammogram).  If you have a family history of breast cancer, talk with your health care provider about genetic screening.  If you are at high risk for breast cancer, talk with your health care provider about having an MRI and a mammogram every year.  Breast cancer (BRCA) gene test is recommended for women who have family members with BRCA-related cancers. Results of the assessment will determine the need for genetic counseling and BRCA1 and for BRCA2 testing. BRCA-related cancers include these types: ? Breast. This occurs in males or females. ? Ovarian. ? Tubal. This may also be called fallopian tube cancer. ? Cancer of the abdominal or pelvic lining (peritoneal cancer). ? Prostate. ? Pancreatic.  Cervical, Uterine, and Ovarian Cancer Your health care provider may recommend that you be screened regularly for cancer of the pelvic organs. These include your ovaries, uterus, and vagina. This screening involves a pelvic exam, which includes checking for microscopic changes to the surface of your cervix (Pap test).  For women ages 21-65, health care providers may recommend a pelvic exam and a Pap test every three years. For women ages 30-65, they may recommend the Pap test and pelvic exam, combined with testing for human papilloma virus (HPV), every five years. Some types of HPV increase your risk of cervical cancer. Testing for HPV may also be done on women of any age who have unclear Pap test  results.  Other health care providers may not recommend any screening for nonpregnant women who are considered low risk for pelvic cancer and have no symptoms. Ask your health care provider if a screening pelvic exam is right for you.  If you have had past treatment for cervical cancer or a condition that could lead to cancer, you need Pap tests and screening for cancer for at least 20 years after your treatment. If Pap tests have been discontinued for you, your risk factors (such as having a new sexual partner) need to be reassessed to determine if you should start having screenings again. Some women have medical problems that increase the chance of getting cervical cancer. In these cases, your health care provider may recommend that you have screening and Pap tests more often.  If you have a family history of uterine cancer or ovarian cancer, talk with your health care provider about genetic screening.  If you have vaginal bleeding after reaching menopause, tell your health care provider.  There are currently no reliable tests available to screen for ovarian cancer.  Lung Cancer Lung cancer screening is recommended for adults 55-80 years old who are at high risk for lung cancer because of a history of smoking. A yearly low-dose CT scan of the lungs is recommended if you:  Currently   smoke.  Have a history of at least 30 pack-years of smoking and you currently smoke or have quit within the past 15 years. A pack-year is smoking an average of one pack of cigarettes per day for one year.  Yearly screening should:  Continue until it has been 15 years since you quit.  Stop if you develop a health problem that would prevent you from having lung cancer treatment.  Colorectal Cancer  This type of cancer can be detected and can often be prevented.  Routine colorectal cancer screening usually begins at age 50 and continues through age 75.  If you have risk factors for colon cancer, your health  care provider may recommend that you be screened at an earlier age.  If you have a family history of colorectal cancer, talk with your health care provider about genetic screening.  Your health care provider may also recommend using home test kits to check for hidden blood in your stool.  A small camera at the end of a tube can be used to examine your colon directly (sigmoidoscopy or colonoscopy). This is done to check for the earliest forms of colorectal cancer.  Direct examination of the colon should be repeated every 5-10 years until age 75. However, if early forms of precancerous polyps or small growths are found or if you have a family history or genetic risk for colorectal cancer, you may need to be screened more often.  Skin Cancer  Check your skin from head to toe regularly.  Monitor any moles. Be sure to tell your health care provider: ? About any new moles or changes in moles, especially if there is a change in a mole's shape or color. ? If you have a mole that is larger than the size of a pencil eraser.  If any of your family members has a history of skin cancer, especially at a young age, talk with your health care provider about genetic screening.  Always use sunscreen. Apply sunscreen liberally and repeatedly throughout the day.  Whenever you are outside, protect yourself by wearing long sleeves, pants, a wide-brimmed hat, and sunglasses.  What should I know about osteoporosis? Osteoporosis is a condition in which bone destruction happens more quickly than new bone creation. After menopause, you may be at an increased risk for osteoporosis. To help prevent osteoporosis or the bone fractures that can happen because of osteoporosis, the following is recommended:  If you are 19-50 years old, get at least 1,000 mg of calcium and at least 600 mg of vitamin D per day.  If you are older than age 50 but younger than age 70, get at least 1,200 mg of calcium and at least 600 mg of  vitamin D per day.  If you are older than age 70, get at least 1,200 mg of calcium and at least 800 mg of vitamin D per day.  Smoking and excessive alcohol intake increase the risk of osteoporosis. Eat foods that are rich in calcium and vitamin D, and do weight-bearing exercises several times each week as directed by your health care provider. What should I know about how menopause affects my mental health? Depression may occur at any age, but it is more common as you become older. Common symptoms of depression include:  Low or sad mood.  Changes in sleep patterns.  Changes in appetite or eating patterns.  Feeling an overall lack of motivation or enjoyment of activities that you previously enjoyed.  Frequent crying spells.  Talk   with your health care provider if you think that you are experiencing depression. What should I know about immunizations? It is important that you get and maintain your immunizations. These include:  Tetanus, diphtheria, and pertussis (Tdap) booster vaccine.  Influenza every year before the flu season begins.  Pneumonia vaccine.  Shingles vaccine.  Your health care provider may also recommend other immunizations. This information is not intended to replace advice given to you by your health care provider. Make sure you discuss any questions you have with your health care provider. Document Released: 02/28/2005 Document Revised: 07/27/2015 Document Reviewed: 10/10/2014 Elsevier Interactive Patient Education  2018 [employer].

## 2017-02-13 ENCOUNTER — Ambulatory Visit: Payer: Managed Care, Other (non HMO) | Admitting: Podiatry

## 2017-02-14 NOTE — Progress Notes (Signed)
   Subjective:  Patient presents today status post left ankle surgery. DOS: 12/09/16. She states she is doing well and has no new complaints at this time. Patient is here for further evaluation and treatment.    Past Medical History:  Diagnosis Date  . Allergy   . Arthritis   . Basal cell carcinoma    face, back, neck, arm  . Basal cell carcinoma    nose, left cheek  . Chicken pox   . Complication of anesthesia    blood pressure elevated after gastric sleeve surgery in recovery, pt does not want mask over face  . Depression   . Diabetes mellitus without complication ([place])    pre-diabetic currently, was diabetic prior to gastric sleeve surgery   . GERD (gastroesophageal reflux disease)   . Gout   . History of shingles   . Hyperlipidemia   . Hypertension    no problems since losing weight after gastric sleeve surgery  . Insomnia   . Migraine   . Obesity   . Osteopenia       Objective/Physical Exam Skin incisions appear to be well coapted. No sign of infectious process noted. No dehiscence. No active bleeding noted. Moderate edema noted to the surgical extremity.  Assessment: 1. s/p left ankle surgery. DOS: 12/09/16 - improving   Plan of Care:  1. Patient was evaluated. 2. Continue wearing compression anklet as needed. 3. Continue taking Mobic daily. 4. Resume wearing good, supportive shoe gear. 5. Return to clinic in 4 weeks.  Going to [place] on 02/11/17.   [name], DPM [facility]  Dr. [name], [address]                                        [place], [place] [address]                Office [phone]  Fax [phone]

## 2017-02-15 ENCOUNTER — Other Ambulatory Visit: Payer: Self-pay | Admitting: Internal Medicine

## 2017-02-15 NOTE — Progress Notes (Signed)
Reviewed Labs from 01/26/17   Urine normal  Total cholesterol elevated 261 goal <200  Trigylcerides 258 goal <150  Hdl 58 normal LDL 151 high   Hep B immune  Hep C neg   Is she missing doses of Crestor 20?  Disc at f/u 03/02/17   Many

## 2017-03-02 ENCOUNTER — Ambulatory Visit (INDEPENDENT_AMBULATORY_CARE_PROVIDER_SITE_OTHER): Payer: No Typology Code available for payment source | Admitting: Internal Medicine

## 2017-03-02 ENCOUNTER — Other Ambulatory Visit: Payer: Self-pay | Admitting: Internal Medicine

## 2017-03-02 ENCOUNTER — Encounter: Payer: Self-pay | Admitting: Internal Medicine

## 2017-03-02 VITALS — BP 150/70 | HR 74 | Temp 98.3°F | Ht 62.0 in | Wt 176.2 lb

## 2017-03-02 DIAGNOSIS — G4700 Insomnia, unspecified: Secondary | ICD-10-CM

## 2017-03-02 DIAGNOSIS — I10 Essential (primary) hypertension: Secondary | ICD-10-CM | POA: Diagnosis not present

## 2017-03-02 DIAGNOSIS — K760 Fatty (change of) liver, not elsewhere classified: Secondary | ICD-10-CM | POA: Diagnosis not present

## 2017-03-02 DIAGNOSIS — E785 Hyperlipidemia, unspecified: Secondary | ICD-10-CM | POA: Diagnosis not present

## 2017-03-02 DIAGNOSIS — R7303 Prediabetes: Secondary | ICD-10-CM | POA: Diagnosis not present

## 2017-03-02 MED ORDER — AMLODIPINE BESYLATE 5 MG PO TABS: 5.0000 mg | ORAL_TABLET | Freq: Every day | ORAL | 0 refills | Status: DC

## 2017-03-02 MED ORDER — ZOLPIDEM TARTRATE ER 6.25 MG PO TBCR: 6.2500 mg | EXTENDED_RELEASE_TABLET | Freq: Every evening | ORAL | 2 refills | Status: DC | PRN

## 2017-03-02 NOTE — Patient Instructions (Addendum)
Please f/u in 2-3 months  mammo schedule for 05/08/17  Take care   Fatty Liver Fatty liver, also called hepatic steatosis or steatohepatitis, is a condition in which too much fat has built up in your liver cells. The liver removes harmful substances from your bloodstream. It produces fluids your body needs. It also helps your body use and store energy from the food you eat. In many cases, fatty liver does not cause symptoms or problems. It is often diagnosed when tests are being done for other reasons. However, over time, fatty liver can cause inflammation that may lead to more serious liver problems, such as scarring of the liver (cirrhosis). What are the causes? Causes of fatty liver may include:  Drinking too much alcohol.  Poor nutrition.  Obesity.  Cushing syndrome.  Diabetes.  Hyperlipidemia.  Pregnancy.  Certain drugs.  Poisons.  Some viral infections.  What increases the risk? You may be more likely to develop fatty liver if you:  Abuse alcohol.  Are pregnant.  Are overweight.  Have diabetes.  Have hepatitis.  Have a high triglyceride level.  What are the signs or symptoms? Fatty liver often does not cause any symptoms. In cases where symptoms develop, they can include:  Fatigue.  Weakness.  Weight loss.  Confusion.  Abdominal pain.  Yellowing of your skin and the white parts of your eyes (jaundice).  Nausea and vomiting.  How is this diagnosed? Fatty liver may be diagnosed by:  Physical exam and medical history.  Blood tests.  Imaging tests, such as an ultrasound, CT scan, or MRI.  Liver biopsy. A small sample of liver tissue is removed using a needle. The sample is then looked at under a microscope.  How is this treated? Fatty liver is often caused by other health conditions. Treatment for fatty liver may involve medicines and lifestyle changes to manage conditions such as:  Alcoholism.  High cholesterol.  Diabetes.  Being  overweight or obese.  Follow these instructions at home:  Eat a healthy diet as directed by your health care provider.  Exercise regularly. This can help you lose weight and control your cholesterol and diabetes. Talk to your health care provider about an exercise plan and which activities are best for you.  Do not drink alcohol.  Take medicines only as directed by your health care provider. Contact a health care provider if: You have difficulty controlling your:  Blood sugar.  Cholesterol.  Alcohol consumption.  Get help right away if:  You have abdominal pain.  You have jaundice.  You have nausea and vomiting. This information is not intended to replace advice given to you by your health care provider. Make sure you discuss any questions you have with your health care provider. Document Released: 02/21/2005 Document Revised: 06/14/2015 Document Reviewed: 05/18/2013 Elsevier Interactive Patient Education  2018 [place] Eating Plan DASH stands for "Dietary Approaches to Stop Hypertension." The DASH eating plan is a healthy eating plan that has been shown to reduce high blood pressure (hypertension). It may also reduce your risk for type 2 diabetes, heart disease, and stroke. The DASH eating plan may also help with weight loss. What are tips for following this plan? General guidelines  Avoid eating more than 2,300 mg (milligrams) of salt (sodium) a day. If you have hypertension, you may need to reduce your sodium intake to 1,500 mg a day.  Limit alcohol intake to no more than 1 drink a day for nonpregnant women and 2  drinks a day for men. One drink equals 12 oz of beer, 5 oz of wine, or 1 oz of hard liquor.  Work with your health care provider to maintain a healthy body weight or to lose weight. Ask what an ideal weight is for you.  Get at least 30 minutes of exercise that causes your heart to beat faster (aerobic exercise) most days of the week. Activities may  include walking, swimming, or biking.  Work with your health care provider or diet and nutrition specialist (dietitian) to adjust your eating plan to your individual calorie needs. Reading food labels  Check food labels for the amount of sodium per serving. Choose foods with less than 5 percent of the Daily Value of sodium. Generally, foods with less than 300 mg of sodium per serving fit into this eating plan.  To find whole grains, look for the word "whole" as the first word in the ingredient list. Shopping  Buy products labeled as "low-sodium" or "no salt added."  Buy fresh foods. Avoid canned foods and premade or frozen meals. Cooking  Avoid adding salt when cooking. Use salt-free seasonings or herbs instead of table salt or sea salt. Check with your health care provider or pharmacist before using salt substitutes.  Do not fry foods. Cook foods using healthy methods such as baking, boiling, grilling, and broiling instead.  Cook with heart-healthy oils, such as olive, canola, soybean, or sunflower oil. Meal planning   Eat a balanced diet that includes: ? 5 or more servings of fruits and vegetables each day. At each meal, try to fill half of your plate with fruits and vegetables. ? Up to 6-8 servings of whole grains each day. ? Less than 6 oz of lean meat, poultry, or fish each day. A 3-oz serving of meat is about the same size as a deck of cards. One egg equals 1 oz. ? 2 servings of low-fat dairy each day. ? A serving of nuts, seeds, or beans 5 times each week. ? Heart-healthy fats. Healthy fats called Omega-3 fatty acids are found in foods such as flaxseeds and coldwater fish, like sardines, salmon, and mackerel.  Limit how much you eat of the following: ? Canned or prepackaged foods. ? Food that is high in trans fat, such as fried foods. ? Food that is high in saturated fat, such as fatty meat. ? Sweets, desserts, sugary drinks, and other foods with added sugar. ? Full-fat  dairy products.  Do not salt foods before eating.  Try to eat at least 2 vegetarian meals each week.  Eat more home-cooked food and less restaurant, buffet, and fast food.  When eating at a restaurant, ask that your food be prepared with less salt or no salt, if possible. What foods are recommended? The items listed may not be a complete list. Talk with your dietitian about what dietary choices are best for you. Grains Whole-grain or whole-wheat bread. Whole-grain or whole-wheat pasta. Brown rice. [place]. Bulgur. Whole-grain and low-sodium cereals. Pita bread. Low-fat, low-sodium crackers. Whole-wheat flour tortillas. Vegetables Fresh or frozen vegetables (raw, steamed, roasted, or grilled). Low-sodium or reduced-sodium tomato and vegetable juice. Low-sodium or reduced-sodium tomato sauce and tomato paste. Low-sodium or reduced-sodium canned vegetables. Fruits All fresh, dried, or frozen fruit. Canned fruit in natural juice (without added sugar). Meat and other protein foods Skinless chicken or [place]. Ground chicken or [place]. Pork with fat trimmed off. Fish and seafood. Egg whites. Dried beans, peas, or lentils. Unsalted nuts, nut butters,  and seeds. Unsalted canned beans. Lean cuts of beef with fat trimmed off. Low-sodium, lean deli meat. Dairy Low-fat (1%) or fat-free (skim) milk. Fat-free, low-fat, or reduced-fat cheeses. Nonfat, low-sodium ricotta or cottage cheese. Low-fat or nonfat yogurt. Low-fat, low-sodium cheese. Fats and oils Soft margarine without trans fats. Vegetable oil. Low-fat, reduced-fat, or light mayonnaise and salad dressings (reduced-sodium). Canola, safflower, olive, soybean, and sunflower oils. Avocado. Seasoning and other foods Herbs. Spices. Seasoning mixes without salt. Unsalted popcorn and pretzels. Fat-free sweets. What foods are not recommended? The items listed may not be a complete list. Talk with your dietitian about what dietary choices are best  for you. Grains Baked goods made with fat, such as croissants, muffins, or some breads. Dry pasta or rice meal packs. Vegetables Creamed or fried vegetables. Vegetables in a cheese sauce. Regular canned vegetables (not low-sodium or reduced-sodium). Regular canned tomato sauce and paste (not low-sodium or reduced-sodium). Regular tomato and vegetable juice (not low-sodium or reduced-sodium). Angie Fava. Olives. Fruits Canned fruit in a light or heavy syrup. Fried fruit. Fruit in cream or butter sauce. Meat and other protein foods Fatty cuts of meat. Ribs. Fried meat. Berniece Salines. Sausage. Bologna and other processed lunch meats. Salami. Fatback. Hotdogs. Bratwurst. Salted nuts and seeds. Canned beans with added salt. Canned or smoked fish. Whole eggs or egg yolks. Chicken or [place] with skin. Dairy Whole or 2% milk, cream, and half-and-half. Whole or full-fat cream cheese. Whole-fat or sweetened yogurt. Full-fat cheese. Nondairy creamers. Whipped toppings. Processed cheese and cheese spreads. Fats and oils Butter. Stick margarine. Lard. Shortening. Ghee. Bacon fat. Tropical oils, such as coconut, palm kernel, or palm oil. Seasoning and other foods Salted popcorn and pretzels. Onion salt, garlic salt, seasoned salt, table salt, and sea salt. Worcestershire sauce. Tartar sauce. Barbecue sauce. Teriyaki sauce. Soy sauce, including reduced-sodium. Steak sauce. Canned and packaged gravies. Fish sauce. Oyster sauce. Cocktail sauce. Horseradish that you find on the shelf. Ketchup. Mustard. Meat flavorings and tenderizers. Bouillon cubes. Hot sauce and Tabasco sauce. Premade or packaged marinades. Premade or packaged taco seasonings. Relishes. Regular salad dressings. Where to find more information:  National Heart, Lung, and [place]: https://wilson-eaton.com/  American Heart Association: www.heart.org Summary  The DASH eating plan is a healthy eating plan that has been shown to reduce high blood pressure  (hypertension). It may also reduce your risk for type 2 diabetes, heart disease, and stroke.  With the DASH eating plan, you should limit salt (sodium) intake to 2,300 mg a day. If you have hypertension, you may need to reduce your sodium intake to 1,500 mg a day.  When on the DASH eating plan, aim to eat more fresh fruits and vegetables, whole grains, lean proteins, low-fat dairy, and heart-healthy fats.  Work with your health care provider or diet and nutrition specialist (dietitian) to adjust your eating plan to your individual calorie needs. This information is not intended to replace advice given to you by your health care provider. Make sure you discuss any questions you have with your health care provider. Document Released: 12/26/2010 Document Revised: 12/31/2015 Document Reviewed: 12/31/2015 Elsevier Interactive Patient Education  2018 [employer].  Hypertension Hypertension, commonly called high blood pressure, is when the force of blood pumping through the arteries is too strong. The arteries are the blood vessels that carry blood from the heart throughout the body. Hypertension forces the heart to work harder to pump blood and may cause arteries to become narrow or stiff. Having untreated or uncontrolled hypertension can  cause heart attacks, strokes, kidney disease, and other problems. A blood pressure reading consists of a higher number over a lower number. Ideally, your blood pressure should be below 120/80. The first ("top") number is called the systolic pressure. It is a measure of the pressure in your arteries as your heart beats. The second ("bottom") number is called the diastolic pressure. It is a measure of the pressure in your arteries as the heart relaxes. What are the causes? The cause of this condition is not known. What increases the risk? Some risk factors for high blood pressure are under your control. Others are not. Factors you can change  Smoking.  Having type 2  diabetes mellitus, high cholesterol, or both.  Not getting enough exercise or physical activity.  Being overweight.  Having too much fat, sugar, calories, or salt (sodium) in your diet.  Drinking too much alcohol. Factors that are difficult or impossible to change  Having chronic kidney disease.  Having a family history of high blood pressure.  Age. Risk increases with age.  Race. You may be at higher risk if you are African-American.  Gender. Men are at higher risk than women before age 33. After age 67, women are at higher risk than men.  Having obstructive sleep apnea.  Stress. What are the signs or symptoms? Extremely high blood pressure (hypertensive crisis) may cause:  Headache.  Anxiety.  Shortness of breath.  Nosebleed.  Nausea and vomiting.  Severe chest pain.  Jerky movements you cannot control (seizures).  How is this diagnosed? This condition is diagnosed by measuring your blood pressure while you are seated, with your arm resting on a surface. The cuff of the blood pressure monitor will be placed directly against the skin of your upper arm at the level of your heart. It should be measured at least twice using the same arm. Certain conditions can cause a difference in blood pressure between your right and left arms. Certain factors can cause blood pressure readings to be lower or higher than normal (elevated) for a short period of time:  When your blood pressure is higher when you are in a health care provider's office than when you are at home, this is called white coat hypertension. Most people with this condition do not need medicines.  When your blood pressure is higher at home than when you are in a health care provider's office, this is called masked hypertension. Most people with this condition may need medicines to control blood pressure.  If you have a high blood pressure reading during one visit or you have normal blood pressure with other risk  factors:  You may be asked to return on a different day to have your blood pressure checked again.  You may be asked to monitor your blood pressure at home for 1 week or longer.  If you are diagnosed with hypertension, you may have other blood or imaging tests to help your health care provider understand your overall risk for other conditions. How is this treated? This condition is treated by making healthy lifestyle changes, such as eating healthy foods, exercising more, and reducing your alcohol intake. Your health care provider may prescribe medicine if lifestyle changes are not enough to get your blood pressure under control, and if:  Your systolic blood pressure is above 130.  Your diastolic blood pressure is above 80.  Your personal target blood pressure may vary depending on your medical conditions, your age, and other factors. Follow these instructions at  home: Eating and drinking  Eat a diet that is high in fiber and potassium, and low in sodium, added sugar, and fat. An example eating plan is called the DASH (Dietary Approaches to Stop Hypertension) diet. To eat this way: ? Eat plenty of fresh fruits and vegetables. Try to fill half of your plate at each meal with fruits and vegetables. ? Eat whole grains, such as whole wheat pasta, brown rice, or whole grain bread. Fill about one quarter of your plate with whole grains. ? Eat or drink low-fat dairy products, such as skim milk or low-fat yogurt. ? Avoid fatty cuts of meat, processed or cured meats, and poultry with skin. Fill about one quarter of your plate with lean proteins, such as fish, chicken without skin, beans, eggs, and tofu. ? Avoid premade and processed foods. These tend to be higher in sodium, added sugar, and fat.  Reduce your daily sodium intake. Most people with hypertension should eat less than 1,500 mg of sodium a day.  Limit alcohol intake to no more than 1 drink a day for nonpregnant women and 2 drinks a day  for men. One drink equals 12 oz of beer, 5 oz of wine, or 1 oz of hard liquor. Lifestyle  Work with your health care provider to maintain a healthy body weight or to lose weight. Ask what an ideal weight is for you.  Get at least 30 minutes of exercise that causes your heart to beat faster (aerobic exercise) most days of the week. Activities may include walking, swimming, or biking.  Include exercise to strengthen your muscles (resistance exercise), such as pilates or lifting weights, as part of your weekly exercise routine. Try to do these types of exercises for 30 minutes at least 3 days a week.  Do not use any products that contain nicotine or tobacco, such as cigarettes and e-cigarettes. If you need help quitting, ask your health care provider.  Monitor your blood pressure at home as told by your health care provider.  Keep all follow-up visits as told by your health care provider. This is important. Medicines  Take over-the-counter and prescription medicines only as told by your health care provider. Follow directions carefully. Blood pressure medicines must be taken as prescribed.  Do not skip doses of blood pressure medicine. Doing this puts you at risk for problems and can make the medicine less effective.  Ask your health care provider about side effects or reactions to medicines that you should watch for. Contact a health care provider if:  You think you are having a reaction to a medicine you are taking.  You have headaches that keep coming back (recurring).  You feel dizzy.  You have swelling in your ankles.  You have trouble with your vision. Get help right away if:  You develop a severe headache or confusion.  You have unusual weakness or numbness.  You feel faint.  You have severe pain in your chest or abdomen.  You vomit repeatedly.  You have trouble breathing. Summary  Hypertension is when the force of blood pumping through your arteries is too strong.  If this condition is not controlled, it may put you at risk for serious complications.  Your personal target blood pressure may vary depending on your medical conditions, your age, and other factors. For most people, a normal blood pressure is less than 120/80.  Hypertension is treated with lifestyle changes, medicines, or a combination of both. Lifestyle changes include weight loss, eating a  healthy, low-sodium diet, exercising more, and limiting alcohol. This information is not intended to replace advice given to you by your health care provider. Make sure you discuss any questions you have with your health care provider. Document Released: 01/06/2005 Document Revised: 12/05/2015 Document Reviewed: 12/05/2015 Elsevier Interactive Patient Education  [employer].

## 2017-03-02 NOTE — Progress Notes (Signed)
Chief Complaint  Patient presents with  . Follow-up   F/u  1. BP elevated 158/68 repeat 150/70 no meds taken  2. Insomnia reports Restoril 15 mg was not enough and 30 mg gave her h/a in the am. She has been on Trazadone 100 mg in the past but after a while it did not work  3. ED visit 02/01/17 for food poisoning she ate meatball sub at restaurant and became ill went to the ED CT ab/pelvis + ileus, enteritis, fatty liver.  Sxs resolved now   4. She is happy to be out of boot on her foot and started doing water aerobics and she likes to tavel    Review of Systems  Constitutional: Negative for weight loss.  HENT: Negative for hearing loss.   Eyes:       No vision problems   Respiratory: Negative for shortness of breath.   Cardiovascular: Negative for chest pain.  Gastrointestinal: Negative for abdominal pain, diarrhea, nausea and vomiting.  Musculoskeletal:       Foot/ankle pain improved   Skin: Negative for rash.  Neurological: Positive for headaches.  Psychiatric/Behavioral: Negative for memory loss.   Past Medical History:  Diagnosis Date  . Allergy   . Arthritis   . Basal cell carcinoma    face, back, neck, arm  . Basal cell carcinoma    nose, left cheek  . Chicken pox   . Complication of anesthesia    blood pressure elevated after gastric sleeve surgery in recovery, pt does not want mask over face  . Depression   . Diabetes mellitus without complication ([place])    pre-diabetic currently, was diabetic prior to gastric sleeve surgery   . GERD (gastroesophageal reflux disease)   . Gout   . History of shingles   . Hyperlipidemia   . Hypertension    no problems since losing weight after gastric sleeve surgery  . Insomnia   . Migraine   . Obesity   . Osteopenia    Past Surgical History:  Procedure Laterality Date  . ABDOMINAL HYSTERECTOMY    . ANKLE ARTHROSCOPY Left 05/29/2016   Procedure: ANKLE ARTHROSCOPY;  Surgeon: [name], DPM;  Location: [facility];  Service:  Podiatry;  Laterality: Left;  . APPENDECTOMY    . basal cell removed    . CESAREAN SECTION N/A    2  . CHOLECYSTECTOMY    . cyst removed from wrist    . FOOT SURGERY     left foot 11/2016 Dr. Laymond Purser   . MOHS SURGERY     x 2 face nose and left cheek   . OSTEOCHONDROMA EXCISION Left 05/29/2016   Procedure: OSTEOCHONDRAL DRILLING TALUS;  Surgeon: [name], DPM;  Location: [facility];  Service: Podiatry;  Laterality: Left;  . PLANTAR FASCIA RELEASE Left 05/29/2016   Procedure: ENDOSCOPIC PLANTAR FASCIOTOMY;  Surgeon: [name], DPM;  Location: [facility];  Service: Podiatry;  Laterality: Left;  . ROTATOR CUFF REPAIR     lt shoulder  . STOMACH SURGERY     gastric sleeve surgery  . TUBAL LIGATION    . wedge resection of ovary  1982   Family History  Problem Relation Age of Onset  . Heart disease Mother   . Breast cancer Mother 54  . Arthritis Mother   . Hyperlipidemia Mother   . Heart disease Father   . Lung cancer Father   . Hyperlipidemia Father   . Hypertension Father   . Diabetes  Sister        x2  . Diabetes Maternal Aunt   . Diabetes Maternal Uncle   . Diabetes Maternal Grandfather   . Hyperlipidemia Maternal Grandfather   . Arthritis Maternal Grandmother   . Sudden death Cousin   . Diabetes Maternal Aunt   . Colon cancer Neg Hx   . Ovarian cancer Neg Hx    Social History   Socioeconomic History  . Marital status: Married    Spouse name: Not on file  . Number of children: Not on file  . Years of education: Not on file  . Highest education level: Not on file  Social Needs  . Financial resource strain: Not on file  . Food insecurity - worry: Not on file  . Food insecurity - inability: Not on file  . Transportation needs - medical: Not on file  . Transportation needs - non-medical: Not on file  Occupational History  . Not on file  Tobacco Use  . Smoking status: Never Smoker  . Smokeless tobacco: Never Used  Substance and Sexual Activity  . Alcohol use:  No  . Drug use: No  . Sexual activity: Yes    Partners: Male    Birth control/protection: Surgical  Other Topics Concern  . Not on file  Social History Narrative   Married x 37 years as of 01/2017    Kids    Write childrens books    Retiring from [employer] 01/2017    Current Meds  Medication Sig  . Calcium-Vitamin D-Vitamin K (CALCIUM SOFT CHEWS PO) Take 1 tablet by mouth 4 (four) times a week.  . estradiol (ESTRACE) 1 MG tablet Take 1 tablet (1 mg total) by mouth daily.  . Ibuprofen-Famotidine (DUEXIS) 800-26.6 MG TABS Take 1 by mouth three times daily (Patient taking differently: Take 1 tablet by mouth 2 (two) times daily as needed (pain). )  . Multiple Vitamins-Minerals (EQ MULTIVITAMINS ADULT GUMMY PO) Take 2 tablets by mouth daily.  Marland Kitchen omeprazole (PRILOSEC) 20 MG capsule Take 20 mg by mouth daily as needed (indigestion).  . rosuvastatin (CRESTOR) 20 MG tablet Take 1 tablet (20 mg total) by mouth at bedtime.  . [DISCONTINUED] temazepam (RESTORIL) 15 MG capsule 1 pill qhs prn sleep, week 2 if needed 2 pills qhs prn   Current Facility-Administered Medications for the 03/02/17 encounter (Office Visit) with McLean-Scocuzza, Nino Glow, MD  Medication  . betamethasone acetate-betamethasone sodium phosphate (CELESTONE) injection 3 mg  . betamethasone acetate-betamethasone sodium phosphate (CELESTONE) injection 3 mg  . betamethasone acetate-betamethasone sodium phosphate (CELESTONE) injection 3 mg   Allergies  Allergen Reactions  . Tetracyclines & Related Swelling and Rash    FACE SWELL, RASH ON CHEST  . Alcohol Rash    ABDOMINAL CRAMPS, drinking alcohol  . Metoprolol Diarrhea   Recent Results (from the past 2160 hour(s))  Urinalysis, Routine w reflex microscopic     Status: None   Collection Time: 01/26/17  7:21 AM  Result Value Ref Range   Specific Gravity, UA 1.020 1.005 - 1.030   pH, UA 6.0 5.0 - 7.5   Color, UA Yellow Yellow   Appearance Ur Clear Clear   Leukocytes, UA Negative  Negative   Protein, UA Negative Negative/Trace   Glucose, UA Negative Negative   Ketones, UA Negative Negative   RBC, UA Negative Negative   Bilirubin, UA Negative Negative   Urobilinogen, Ur 0.2 0.2 - 1.0 mg/dL   Nitrite, UA Negative Negative   Microscopic Examination Comment  Comment: Microscopic not indicated and not performed.  Lipid Panel w/o Chol/HDL Ratio     Status: Abnormal   Collection Time: 01/26/17  7:21 AM  Result Value Ref Range   Cholesterol, Total 261 (H) 100 - 199 mg/dL   Triglycerides 258 (H) 0 - 149 mg/dL   HDL 58 >39 mg/dL   VLDL Cholesterol Cal 52 (H) 5 - 40 mg/dL   LDL Calculated 151 (H) 0 - 99 mg/dL  Hepatitis B surface antibody     Status: None   Collection Time: 01/26/17  7:21 AM  Result Value Ref Range   Hepatitis B Surf Ab Quant 111.9 Immunity>9.9 mIU/mL    Comment:   Status of Immunity                     Anti-HBs Level   ------------------                     -------------- Inconsistent with Immunity                   0.0 - 9.9 Consistent with Immunity                          >9.9   Hepatitis c antibody (reflex)     Status: None   Collection Time: 01/26/17  7:21 AM  Result Value Ref Range   HCV Ab <0.1 0.0 - 0.9 s/co ratio  HCV Comment:     Status: None   Collection Time: 01/26/17  7:21 AM  Result Value Ref Range   Comment: Comment     Comment: Non reactive HCV antibody screen is consistent with no HCV infection, unless recent infection is suspected or other evidence exists to indicate HCV infection.   Hepatitis B surface antigen     Status: None   Collection Time: 01/26/17  7:21 AM  Result Value Ref Range   Hepatitis B Surface Ag Negative Negative  Hepatitis B core antibody, total     Status: None   Collection Time: 01/26/17  7:21 AM  Result Value Ref Range   Hep B Core Total Ab Negative Negative  Lipase, blood     Status: None   Collection Time: 02/01/17 12:37 AM  Result Value Ref Range   Lipase 25 11 - 51 U/L    Comment: Performed  at [facility], [address] [place], [place] [address]  Comprehensive metabolic panel     Status: Abnormal   Collection Time: 02/01/17 12:37 AM  Result Value Ref Range   Sodium 140 135 - 145 mmol/L   Potassium 3.1 (L) 3.5 - 5.1 mmol/L   Chloride 112 (H) 101 - 111 mmol/L   CO2 18 (L) 22 - 32 mmol/L   Glucose, Bld 141 (H) 65 - 99 mg/dL   BUN 16 6 - 20 mg/dL   Creatinine, Ser 0.53 0.44 - 1.00 mg/dL   Calcium 7.3 (L) 8.9 - 10.3 mg/dL   Total Protein 6.3 (L) 6.5 - 8.1 g/dL   Albumin 3.4 (L) 3.5 - 5.0 g/dL   AST 22 15 - 41 U/L   ALT 13 (L) 14 - 54 U/L   Alkaline Phosphatase 76 38 - 126 U/L   Total Bilirubin 0.4 0.3 - 1.2 mg/dL   GFR calc non Af Amer >60 >60 mL/min   GFR calc Af Amer >60 >60 mL/min    Comment: (NOTE) The eGFR has been calculated using  the CKD EPI equation. This calculation has not been validated in all clinical situations. eGFR's persistently <60 mL/min signify possible Chronic Kidney Disease.    Anion gap 10 5 - 15    Comment: Performed at [facility], [address] [place], [place] [address]  CBC     Status: Abnormal   Collection Time: 02/01/17 12:37 AM  Result Value Ref Range   WBC 14.6 (H) 3.6 - 11.0 K/uL   RBC 4.87 3.80 - 5.20 MIL/uL   Hemoglobin 14.3 12.0 - 16.0 g/dL   HCT 42.4 35.0 - 47.0 %   MCV 87.1 80.0 - 100.0 fL   MCH 29.4 26.0 - 34.0 pg   MCHC 33.7 32.0 - 36.0 g/dL   RDW 13.6 11.5 - 14.5 %   Platelets 281 150 - 440 K/uL    Comment: Performed at [facility], [address] [place], [place] [address]  Urinalysis, Complete w Microscopic     Status: Abnormal   Collection Time: 02/01/17 12:37 AM  Result Value Ref Range   Color, Urine YELLOW (A) YELLOW   APPearance CLEAR (A) CLEAR   Specific Gravity, Urine 1.019 1.005 - 1.030   pH 5.0 5.0 - 8.0   Glucose, UA NEGATIVE NEGATIVE mg/dL   Hgb urine dipstick MODERATE (A) NEGATIVE   Bilirubin Urine NEGATIVE NEGATIVE   Ketones, ur 80 (A) NEGATIVE mg/dL   Protein, ur  NEGATIVE NEGATIVE mg/dL   Nitrite NEGATIVE NEGATIVE   Leukocytes, UA NEGATIVE NEGATIVE   RBC / HPF 0-5 0 - 5 RBC/hpf   WBC, UA 0-5 0 - 5 WBC/hpf   Bacteria, UA NONE SEEN NONE SEEN   Squamous Epithelial / LPF 0-5 (A) NONE SEEN   Mucus PRESENT     Comment: Performed at [facility], [address] [place],  [address]   Objective  Body mass index is 32.23 kg/m. Wt Readings from Last 3 Encounters:  03/02/17 176 lb 3.2 oz (79.9 kg)  02/10/17 172 lb 6.4 oz (78.2 kg)  01/22/17 175 lb (79.4 kg)   Temp Readings from Last 3 Encounters:  03/02/17 98.3 F (36.8 C) (Oral)  02/01/17 97.6 F (36.4 C)  01/22/17 98.3 F (36.8 C) (Oral)   BP Readings from Last 3 Encounters:  03/02/17 (!) 150/70  02/10/17 (!) 164/83  02/01/17 (!) 144/67   Pulse Readings from Last 3 Encounters:  03/02/17 74  02/10/17 80  02/01/17 78   O2 sat room air 96%  Physical Exam  Constitutional: She is oriented to person, place, and time and well-developed, well-nourished, and in no distress.  HENT:  Head: Normocephalic and atraumatic.  Mouth/Throat: Oropharynx is clear and moist and mucous membranes are normal.  Eyes: Conjunctivae are normal. Pupils are equal, round, and reactive to light.  Cardiovascular: Normal rate, regular rhythm and normal heart sounds.  Pulmonary/Chest: Effort normal and breath sounds normal.  Neurological: She is alert and oriented to person, place, and time. Gait normal. Gait normal.  Skin: Skin is warm, dry and intact.  Psychiatric: Mood, memory, affect and judgment normal.  Nursing note and vitals reviewed.   Assessment   1. HTN uncontrolled, HLD uncontrolled  2. Insomnia  3. Fatty liver  4. H M Plan  1. Start Norvasc 5 mg qd f/u in 2-3 months On crestor repeat lipid at f/u  Given [facility] form today for BMET, CBC, A1C h/o hyperglycemia  2. Stop restoril reviewed side effects can be h/a  trazadone 100 mg in the past  ineffective  ambien 10 mg helped in the  past. Will do trial ambien CR 6.25 mg qd  3. Given info fatty liver  rec hep A vaccine. Hep B immune  4.  Had flu and Tdap  Disc shingrix  rec hep A vaccine  Hep C neg   Never smoker, chew congratulated not doing either  mammo due 05/08/17 order in per ob/gyn  Colonoscopy -obtained records 07/15/11 colonoscopy Dr. Candace Cruise nl repeat in 10 years  Pap s/p hysterectomy ? Cervix intact has appt Dr. Enzo Bi 02/10/17. Appt went well f/u in 1 year  -per not pap not needed   Dermatology sees [place] skin q6 months h/o BCC nose and left cheek s/p Mohs and h/o Aks   Will ask about eye exam at f/u   Declines STD check last visit  Provider: Dr. Olivia Mackie McLean-Scocuzza-Internal Medicine

## 2017-03-02 NOTE — Progress Notes (Signed)
Pre visit review using our clinic review tool, if applicable. No additional management support is needed unless otherwise documented below in the visit note. 

## 2017-03-03 ENCOUNTER — Other Ambulatory Visit: Payer: Self-pay | Admitting: Internal Medicine

## 2017-03-03 LAB — CBC WITH DIFFERENTIAL/PLATELET
BASOS ABS: 0.1 10*3/uL (ref 0.0–0.2)
BASOS: 1 %
EOS (ABSOLUTE): 0.1 10*3/uL (ref 0.0–0.4)
EOS: 2 %
HEMATOCRIT: 41 % (ref 34.0–46.6)
HEMOGLOBIN: 13.5 g/dL (ref 11.1–15.9)
Immature Grans (Abs): 0 10*3/uL (ref 0.0–0.1)
Immature Granulocytes: 0 %
LYMPHS ABS: 2.9 10*3/uL (ref 0.7–3.1)
Lymphs: 35 %
MCH: 28.9 pg (ref 26.6–33.0)
MCHC: 32.9 g/dL (ref 31.5–35.7)
MCV: 88 fL (ref 79–97)
MONOCYTES: 8 %
Monocytes Absolute: 0.7 10*3/uL (ref 0.1–0.9)
NEUTROS ABS: 4.5 10*3/uL (ref 1.4–7.0)
Neutrophils: 54 %
Platelets: 267 10*3/uL (ref 150–379)
RBC: 4.67 x10E6/uL (ref 3.77–5.28)
RDW: 14.1 % (ref 12.3–15.4)
WBC: 8.3 10*3/uL (ref 3.4–10.8)

## 2017-03-03 LAB — BASIC METABOLIC PANEL
BUN / CREAT RATIO: 18 (ref 9–23)
BUN: 11 mg/dL (ref 6–24)
CO2: 24 mmol/L (ref 20–29)
CREATININE: 0.61 mg/dL (ref 0.57–1.00)
Calcium: 9.2 mg/dL (ref 8.7–10.2)
Chloride: 102 mmol/L (ref 96–106)
GFR calc Af Amer: 116 mL/min/{1.73_m2} (ref >59)
GFR, EST NON AFRICAN AMERICAN: 101 mL/min/{1.73_m2} (ref >59)
GLUCOSE: 91 mg/dL (ref 65–99)
Potassium: 4.5 mmol/L (ref 3.5–5.2)
SODIUM: 141 mmol/L (ref 134–144)

## 2017-03-03 LAB — HGB A1C W/O EAG: HEMOGLOBIN A1C: 5.9 % — AB (ref 4.8–5.6)

## 2017-03-03 NOTE — Progress Notes (Signed)
CBC, BMET nl 03/02/17  A1C 5.9 prediabetic   [facility]  [place]

## 2017-03-04 ENCOUNTER — Telehealth: Payer: Self-pay

## 2017-03-04 NOTE — Telephone Encounter (Signed)
-----   Message from Delorise Jackson, MD sent at 03/03/2017  6:14 PM EST ----- CBC, BMET nl/normal 03/02/17  A1C 5.9 prediabetic try healthy diet and exercise as she has resumed  [facility]  [place]

## 2017-03-04 NOTE — Telephone Encounter (Signed)
Left message for patient to return call back for lab results. [facility] may give results below.

## 2017-03-04 NOTE — Telephone Encounter (Signed)
Pt given results per notes of Dr. Terese Door on 03/03/17. Unable to document in result note due to result note not being routed to [facility].

## 2017-03-10 ENCOUNTER — Ambulatory Visit (INDEPENDENT_AMBULATORY_CARE_PROVIDER_SITE_OTHER): Payer: No Typology Code available for payment source | Admitting: Podiatry

## 2017-03-10 ENCOUNTER — Encounter: Payer: Self-pay | Admitting: Podiatry

## 2017-03-10 DIAGNOSIS — M659 Synovitis and tenosynovitis, unspecified: Secondary | ICD-10-CM

## 2017-03-12 NOTE — Progress Notes (Signed)
   Subjective:  Patient presents today status post left ankle surgery. DOS: 12/09/16. She states she is doing well and has no new complaints at this time. She denies any pain. Patient is here for further evaluation and treatment.    Past Medical History:  Diagnosis Date  . Allergy   . Arthritis   . Basal cell carcinoma    face, back, neck, arm  . Basal cell carcinoma    nose, left cheek  . Chicken pox   . Complication of anesthesia    blood pressure elevated after gastric sleeve surgery in recovery, pt does not want mask over face  . Depression   . Diabetes mellitus without complication ([place])    pre-diabetic currently, was diabetic prior to gastric sleeve surgery   . GERD (gastroesophageal reflux disease)   . Gout   . History of shingles   . Hyperlipidemia   . Hypertension    no problems since losing weight after gastric sleeve surgery  . Insomnia   . Migraine   . Obesity   . Osteopenia       Objective/Physical Exam Skin incisions appear to be well coapted. No sign of infectious process noted. No dehiscence. No active bleeding noted. Moderate edema noted to the surgical extremity.  Assessment: 1. s/p left ankle surgery. DOS: 12/09/16 - improved   Plan of Care:  1. Patient was evaluated. 2. May resume full activity with no restrictions.  3. Recommended good shoe gear.  4. Return to clinic as needed.    [name], DPM [facility]  Dr. [name], [address]                                        [place], [place] [address]                Office [phone]  Fax [phone]

## 2017-03-17 ENCOUNTER — Telehealth: Payer: Self-pay | Admitting: Internal Medicine

## 2017-03-17 NOTE — Telephone Encounter (Deleted)
Copied from [place] #[id number]. >> Mar 17, 2017 11:22 AM Cecelia Byars, NT wrote: Patient called and said the Ambien dose prescribed is not working ,it will put her to sleep she awakes at 230 am please advise [phone]

## 2017-03-17 NOTE — Telephone Encounter (Signed)
Please advise 

## 2017-03-17 NOTE — Telephone Encounter (Signed)
Copied from [place] #[id number]. >> Mar 17, 2017 11:22 AM Cecelia Byars, NT wrote: Patient called and said the Ambien dose prescribed is not working ,it will put her to sleep she awakes at 230 am please advise [phone]

## 2017-03-18 ENCOUNTER — Other Ambulatory Visit: Payer: Self-pay | Admitting: Internal Medicine

## 2017-03-18 DIAGNOSIS — G4700 Insomnia, unspecified: Secondary | ICD-10-CM

## 2017-03-18 MED ORDER — ZOLPIDEM TARTRATE ER 12.5 MG PO TBCR: 12.5000 mg | EXTENDED_RELEASE_TABLET | Freq: Every evening | ORAL | 2 refills | Status: DC | PRN

## 2017-03-18 NOTE — Telephone Encounter (Signed)
Medication has been faxed

## 2017-03-18 NOTE — Telephone Encounter (Signed)
Lets increase dose take 2 of 6.25 and new Rx will be for 12.5  Inform pt  Fax into pharmacy   Thanks [place]

## 2017-03-18 NOTE — Telephone Encounter (Signed)
rx on printer will sign

## 2017-03-30 ENCOUNTER — Telehealth: Payer: Self-pay | Admitting: *Deleted

## 2017-03-30 MED ORDER — FLUCONAZOLE 150 MG PO TABS: 150.0000 mg | ORAL_TABLET | Freq: Every day | ORAL | 0 refills | Status: DC

## 2017-03-30 NOTE — Addendum Note (Signed)
Addended by: Elouise Munroe on: 03/30/2017 02:13 PM   Modules accepted: Orders

## 2017-03-30 NOTE — Telephone Encounter (Signed)
Pt called she has a yeast inf, would like diflucan sent in

## 2017-03-30 NOTE — Telephone Encounter (Signed)
Pt states she has a slight discharge, itching, and burning. NO new sex partners, body wash, or detergents. Erx diflucan. If no better in a week she will need to make an appointment to be seen.

## 2017-05-06 ENCOUNTER — Ambulatory Visit (INDEPENDENT_AMBULATORY_CARE_PROVIDER_SITE_OTHER): Payer: No Typology Code available for payment source | Admitting: Internal Medicine

## 2017-05-06 ENCOUNTER — Encounter: Payer: Self-pay | Admitting: Internal Medicine

## 2017-05-06 VITALS — BP 136/66 | HR 68 | Temp 98.2°F | Ht 62.0 in | Wt 177.4 lb

## 2017-05-06 DIAGNOSIS — K760 Fatty (change of) liver, not elsewhere classified: Secondary | ICD-10-CM | POA: Diagnosis not present

## 2017-05-06 DIAGNOSIS — E785 Hyperlipidemia, unspecified: Secondary | ICD-10-CM

## 2017-05-06 DIAGNOSIS — E669 Obesity, unspecified: Secondary | ICD-10-CM | POA: Insufficient documentation

## 2017-05-06 DIAGNOSIS — I10 Essential (primary) hypertension: Secondary | ICD-10-CM

## 2017-05-06 DIAGNOSIS — R42 Dizziness and giddiness: Secondary | ICD-10-CM

## 2017-05-06 DIAGNOSIS — R319 Hematuria, unspecified: Secondary | ICD-10-CM

## 2017-05-06 DIAGNOSIS — Z23 Encounter for immunization: Secondary | ICD-10-CM | POA: Diagnosis not present

## 2017-05-06 HISTORY — DX: Fatty (change of) liver, not elsewhere classified: K76.0

## 2017-05-06 HISTORY — DX: Obesity, unspecified: E66.9

## 2017-05-06 MED ORDER — MECLIZINE HCL 12.5 MG PO TABS: 12.5000 mg | ORAL_TABLET | Freq: Two times a day (BID) | ORAL | 2 refills | Status: DC | PRN

## 2017-05-06 MED ORDER — AMLODIPINE BESYLATE 5 MG PO TABS: 2.5000 mg | ORAL_TABLET | Freq: Every day | ORAL | 0 refills | Status: DC

## 2017-05-06 NOTE — Patient Instructions (Addendum)
You will need another hepatitis A vaccine 06/05/17 (1 month) and 11/05/17 (6 months) of hepatitis A vaccine Think about shingrix vaccine  Get lipid fasting and urine at [facility]  Vertigo Vertigo is the feeling that you or your surroundings are moving when they are not. Vertigo can be dangerous if it occurs while you are doing something that could endanger you or others, such as driving. What are the causes? This condition is caused by a disturbance in the signals that are sent by your body's sensory systems to your brain. Different causes of a disturbance can lead to vertigo, including:  Infections, especially in the inner ear.  A bad reaction to a drug, or misuse of alcohol and medicines.  Withdrawal from drugs or alcohol.  Quickly changing positions, as when lying down or rolling over in bed.  Migraine headaches.  Decreased blood flow to the brain.  Decreased blood pressure.  Increased pressure in the brain from a head or neck injury, stroke, infection, tumor, or bleeding.  Central nervous system disorders.  What are the signs or symptoms? Symptoms of this condition usually occur when you move your head or your eyes in different directions. Symptoms may start suddenly, and they usually last for less than a minute. Symptoms may include:  Loss of balance and falling.  Feeling like you are spinning or moving.  Feeling like your surroundings are spinning or moving.  Nausea and vomiting.  Blurred vision or double vision.  Difficulty hearing.  Slurred speech.  Dizziness.  Involuntary eye movement (nystagmus).  Symptoms can be mild and cause only slight annoyance, or they can be severe and interfere with daily life. Episodes of vertigo may return (recur) over time, and they are often triggered by certain movements. Symptoms may improve over time. How is this diagnosed? This condition may be diagnosed based on medical history and the quality of your nystagmus. Your health  care provider may test your eye movements by asking you to quickly change positions to trigger the nystagmus. This may be called the Dix-Hallpike test, head thrust test, or roll test. You may be referred to a health care provider who specializes in ear, nose, and throat (ENT) problems (otolaryngologist) or a provider who specializes in disorders of the central nervous system (neurologist). You may have additional testing, including:  A physical exam.  Blood tests.  MRI.  A CT scan.  An electrocardiogram (ECG). This records electrical activity in your heart.  An electroencephalogram (EEG). This records electrical activity in your brain.  Hearing tests.  How is this treated? Treatment for this condition depends on the cause and the severity of the symptoms. Treatment options include:  Medicines to treat nausea or vertigo. These are usually used for severe cases. Some medicines that are used to treat other conditions may also reduce or eliminate vertigo symptoms. These include: ? Medicines that control allergies (antihistamines). ? Medicines that control seizures (anticonvulsants). ? Medicines that relieve depression (antidepressants). ? Medicines that relieve anxiety (sedatives).  Head movements to adjust your inner ear back to normal. If your vertigo is caused by an ear problem, your health care provider may recommend certain movements to correct the problem.  Surgery. This is rare.  Follow these instructions at home: Safety  Move slowly.Avoid sudden body or head movements.  Avoid driving.  Avoid operating heavy machinery.  Avoid doing any tasks that would cause danger to you or others if you would have a vertigo episode during the task.  If you have trouble  walking or keeping your balance, try using a cane for stability. If you feel dizzy or unstable, sit down right away.  Return to your normal activities as told by your health care provider. Ask your health care provider  what activities are safe for you. General instructions  Take over-the-counter and prescription medicines only as told by your health care provider.  Avoid certain positions or movements as told by your health care provider.  Drink enough fluid to keep your urine clear or pale yellow.  Keep all follow-up visits as told by your health care provider. This is important. Contact a health care provider if:  Your medicines do not relieve your vertigo or they make it worse.  You have a fever.  Your condition gets worse or you develop new symptoms.  Your family or friends notice any behavioral changes.  Your nausea or vomiting gets worse.  You have numbness or a "pins and needles" sensation in part of your body. Get help right away if:  You have difficulty moving or speaking.  You are always dizzy.  You faint.  You develop severe headaches.  You have weakness in your hands, arms, or legs.  You have changes in your hearing or vision.  You develop a stiff neck.  You develop sensitivity to light. This information is not intended to replace advice given to you by your health care provider. Make sure you discuss any questions you have with your health care provider. Document Released: 10/16/2004 Document Revised: 06/20/2015 Document Reviewed: 05/01/2014 Elsevier Interactive Patient Education  2018 [employer].    Tennis Elbow Tennis elbow (lateral epicondylitis) is inflammation of the outer tendons of your forearm close to your elbow. Your tendons attach your muscles to your bones. The outer tendons of your forearm are used to extend your wrist, and they attach on the outside part of your elbow. Tennis elbow is often found in people who play tennis, but anyone may get the condition from repeatedly extending the wrist or turning the forearm. What are the causes? This condition is caused by repeatedly extending your wrist and using your hands. It can result from sports or work that  requires repetitive forearm movements. Tennis elbow may also be caused by an injury. What increases the risk? You have a higher risk of developing tennis elbow if you play tennis or another racquet sport. You also have a higher risk if you frequently use your hands for work. This condition is also more likely to develop in:  Musicians.  Carpenters, painters, and plumbers.  Cooks.  Cashiers.  People who work in [employer].  Architect workers.  Butchers.  People who use computers.  What are the signs or symptoms? Symptoms of this condition include:  Pain and tenderness in your forearm and the outer part of your elbow. You may only feel the pain when you use your arm, or you may feel it even when you are not using your arm.  A burning feeling that runs from your elbow through your arm.  Weak grip in your hands.  How is this diagnosed? This condition may be diagnosed by medical history and physical exam. You may also have other tests, including:  X-rays.  MRI.  How is this treated? Your health care provider will recommend lifestyle adjustments, such as resting and icing your arm. Treatment may also include:  Medicines for inflammation. This may include shots of cortisone if your pain continues.  Physical therapy. This may include massage or exercises.  An  elbow brace.  Surgery may eventually be recommended if your pain does not go away with treatment. Follow these instructions at home: Activity  Rest your elbow and wrist as directed by your health care provider. Try to avoid any activities that caused the problem until your health care provider says that you can do them again.  If a physical therapist teaches you exercises, do all of them as directed.  If you lift an object, lift it with your palm facing upward. This lowers the stress on your elbow. Lifestyle  If your tennis elbow is caused by sports, check your equipment and make sure that: ? You are using it  correctly. ? It is the best fit for you.  If your tennis elbow is caused by work, take breaks frequently, if you are able. Talk with your manager about how to best perform tasks in a way that is safe. ? If your tennis elbow is caused by computer use, talk with your manager about any changes that can be made to your work environment. General instructions  If directed, apply ice to the painful area: ? Put ice in a plastic bag. ? Place a towel between your skin and the bag. ? Leave the ice on for 20 minutes, 2-3 times per day.  Take medicines only as directed by your health care provider.  If you were given a brace, wear it as directed by your health care provider.  Keep all follow-up visits as directed by your health care provider. This is important. Contact a health care provider if:  Your pain does not get better with treatment.  Your pain gets worse.  You have numbness or weakness in your forearm, hand, or fingers. This information is not intended to replace advice given to you by your health care provider. Make sure you discuss any questions you have with your health care provider. Document Released: 01/06/2005 Document Revised: 09/06/2015 Document Reviewed: 01/02/2014 Elsevier Interactive Patient Education  2018 [employer].   Hepatitis A Vaccine, Inactivated suspension for injection What is this medicine? HEPATITIS A VACCINE (hep uh TAHY tis A VAK seen) is a vaccine to protect from an infection with the hepatitis A virus. This vaccine does not contain the live virus. It will not cause a hepatitis infection. This vaccine is also used with immunoglobulin to prevent infection in people who have been exposed to hepatitis A. This medicine may be used for other purposes; ask your health care provider or pharmacist if you have questions. COMMON BRAND NAME(S): Havrix, Vaqta What should I tell my health care provider before I take this medicine? They need to know if you have any of  these conditions: -bleeding disorder -fever or infection -heart disease -immune system problems -an unusual or allergic reaction to hepatitis A vaccine, latex, neomycin, other medicines, foods, dyes, or preservatives -pregnant or trying to get pregnant -breast-feeding How should I use this medicine? This vaccine is for injection into a muscle. It is given by a health care professional. A copy of Vaccine Information Statements will be given before each vaccination. Read this sheet carefully each time. The sheet may change frequently. Talk to your pediatrician regarding the use of this medicine in children. While this drug may be prescribed for children as young as 82 months of age for selected conditions, precautions do apply. Overdosage: If you think you have taken too much of this medicine contact a poison control center or emergency room at once. NOTE: This medicine is only for you.  Do not share this medicine with others. What if I miss a dose? This does not apply. What may interact with this medicine? -medicines to treat cancer -medicines that suppress your immune function like adalimumab, anakinra, etanercept, infliximab -steroid medicines like prednisone or cortisone This list may not describe all possible interactions. Give your health care provider a list of all the medicines, herbs, non-prescription drugs, or dietary supplements you use. Also tell them if you smoke, drink alcohol, or use illegal drugs. Some items may interact with your medicine. What should I watch for while using this medicine? See your health care provider for a booster shot of this vaccine as directed. Tell your doctor right away if you have any serious or unusual side effects after getting this vaccine. You will not have protection from the hepatitis A virus for at least 8 to 10 days after your first injection. The length of time you will have protection from hepatitis A virus infection is not known. Check with  your doctor if you have questions about your immunity. See your doctor before you travel out of the country. What side effects may I notice from receiving this medicine? Side effects that you should report to your doctor or health care professional as soon as possible: -allergic reactions like skin rash, itching or hives, swelling of the face, lips, or tongue -breathing problems -seizures -yellowing of the eyes or skin Side effects that usually do not require medical attention (report to your doctor or health care professional if they continue or are bothersome): -diarrhea -fever -loss of appetite -muscle pain -nausea -pain, redness, swelling or irritation at site where injected -tiredness This list may not describe all possible side effects. Call your doctor for medical advice about side effects. You may report side effects to FDA at 1-800-FDA-1088. Where should I keep my medicine? This drug is given in a hospital or clinic and will not be stored at home. NOTE: This sheet is a summary. It may not cover all possible information. If you have questions about this medicine, talk to your doctor, pharmacist, or health care provider.  2018 Elsevier/Gold Standard (2013-05-09 13:19:40)  Vertigo Vertigo is the feeling that you or your surroundings are moving when they are not. Vertigo can be dangerous if it occurs while you are doing something that could endanger you or others, such as driving. What are the causes? This condition is caused by a disturbance in the signals that are sent by your body's sensory systems to your brain. Different causes of a disturbance can lead to vertigo, including:  Infections, especially in the inner ear.  A bad reaction to a drug, or misuse of alcohol and medicines.  Withdrawal from drugs or alcohol.  Quickly changing positions, as when lying down or rolling over in bed.  Migraine headaches.  Decreased blood flow to the brain.  Decreased blood  pressure.  Increased pressure in the brain from a head or neck injury, stroke, infection, tumor, or bleeding.  Central nervous system disorders.  What are the signs or symptoms? Symptoms of this condition usually occur when you move your head or your eyes in different directions. Symptoms may start suddenly, and they usually last for less than a minute. Symptoms may include:  Loss of balance and falling.  Feeling like you are spinning or moving.  Feeling like your surroundings are spinning or moving.  Nausea and vomiting.  Blurred vision or double vision.  Difficulty hearing.  Slurred speech.  Dizziness.  Involuntary eye movement (  nystagmus).  Symptoms can be mild and cause only slight annoyance, or they can be severe and interfere with daily life. Episodes of vertigo may return (recur) over time, and they are often triggered by certain movements. Symptoms may improve over time. How is this diagnosed? This condition may be diagnosed based on medical history and the quality of your nystagmus. Your health care provider may test your eye movements by asking you to quickly change positions to trigger the nystagmus. This may be called the Dix-Hallpike test, head thrust test, or roll test. You may be referred to a health care provider who specializes in ear, nose, and throat (ENT) problems (otolaryngologist) or a provider who specializes in disorders of the central nervous system (neurologist). You may have additional testing, including:  A physical exam.  Blood tests.  MRI.  A CT scan.  An electrocardiogram (ECG). This records electrical activity in your heart.  An electroencephalogram (EEG). This records electrical activity in your brain.  Hearing tests.  How is this treated? Treatment for this condition depends on the cause and the severity of the symptoms. Treatment options include:  Medicines to treat nausea or vertigo. These are usually used for severe cases. Some  medicines that are used to treat other conditions may also reduce or eliminate vertigo symptoms. These include: ? Medicines that control allergies (antihistamines). ? Medicines that control seizures (anticonvulsants). ? Medicines that relieve depression (antidepressants). ? Medicines that relieve anxiety (sedatives).  Head movements to adjust your inner ear back to normal. If your vertigo is caused by an ear problem, your health care provider may recommend certain movements to correct the problem.  Surgery. This is rare.  Follow these instructions at home: Safety  Move slowly.Avoid sudden body or head movements.  Avoid driving.  Avoid operating heavy machinery.  Avoid doing any tasks that would cause danger to you or others if you would have a vertigo episode during the task.  If you have trouble walking or keeping your balance, try using a cane for stability. If you feel dizzy or unstable, sit down right away.  Return to your normal activities as told by your health care provider. Ask your health care provider what activities are safe for you. General instructions  Take over-the-counter and prescription medicines only as told by your health care provider.  Avoid certain positions or movements as told by your health care provider.  Drink enough fluid to keep your urine clear or pale yellow.  Keep all follow-up visits as told by your health care provider. This is important. Contact a health care provider if:  Your medicines do not relieve your vertigo or they make it worse.  You have a fever.  Your condition gets worse or you develop new symptoms.  Your family or friends notice any behavioral changes.  Your nausea or vomiting gets worse.  You have numbness or a "pins and needles" sensation in part of your body. Get help right away if:  You have difficulty moving or speaking.  You are always dizzy.  You faint.  You develop severe headaches.  You have weakness in  your hands, arms, or legs.  You have changes in your hearing or vision.  You develop a stiff neck.  You develop sensitivity to light. This information is not intended to replace advice given to you by your health care provider. Make sure you discuss any questions you have with your health care provider. Document Released: 10/16/2004 Document Revised: 06/20/2015 Document Reviewed: 05/01/2014 Elsevier Interactive  Patient Education  2018 [employer].  Fatty Liver Fatty liver, also called hepatic steatosis or steatohepatitis, is a condition in which too much fat has built up in your liver cells. The liver removes harmful substances from your bloodstream. It produces fluids your body needs. It also helps your body use and store energy from the food you eat. In many cases, fatty liver does not cause symptoms or problems. It is often diagnosed when tests are being done for other reasons. However, over time, fatty liver can cause inflammation that may lead to more serious liver problems, such as scarring of the liver (cirrhosis). What are the causes? Causes of fatty liver may include:  Drinking too much alcohol.  Poor nutrition.  Obesity.  Cushing syndrome.  Diabetes.  Hyperlipidemia.  Pregnancy.  Certain drugs.  Poisons.  Some viral infections.  What increases the risk? You may be more likely to develop fatty liver if you:  Abuse alcohol.  Are pregnant.  Are overweight.  Have diabetes.  Have hepatitis.  Have a high triglyceride level.  What are the signs or symptoms? Fatty liver often does not cause any symptoms. In cases where symptoms develop, they can include:  Fatigue.  Weakness.  Weight loss.  Confusion.  Abdominal pain.  Yellowing of your skin and the white parts of your eyes (jaundice).  Nausea and vomiting.  How is this diagnosed? Fatty liver may be diagnosed by:  Physical exam and medical history.  Blood tests.  Imaging tests, such as an  ultrasound, CT scan, or MRI.  Liver biopsy. A small sample of liver tissue is removed using a needle. The sample is then looked at under a microscope.  How is this treated? Fatty liver is often caused by other health conditions. Treatment for fatty liver may involve medicines and lifestyle changes to manage conditions such as:  Alcoholism.  High cholesterol.  Diabetes.  Being overweight or obese.  Follow these instructions at home:  Eat a healthy diet as directed by your health care provider.  Exercise regularly. This can help you lose weight and control your cholesterol and diabetes. Talk to your health care provider about an exercise plan and which activities are best for you.  Do not drink alcohol.  Take medicines only as directed by your health care provider. Contact a health care provider if: You have difficulty controlling your:  Blood sugar.  Cholesterol.  Alcohol consumption.  Get help right away if:  You have abdominal pain.  You have jaundice.  You have nausea and vomiting. This information is not intended to replace advice given to you by your health care provider. Make sure you discuss any questions you have with your health care provider. Document Released: 02/21/2005 Document Revised: 06/14/2015 Document Reviewed: 05/18/2013 Elsevier Interactive Patient Education  [employer].

## 2017-05-06 NOTE — Progress Notes (Signed)
Pre visit review using our clinic review tool, if applicable. No additional management support is needed unless otherwise documented below in the visit note. 

## 2017-05-08 ENCOUNTER — Encounter: Payer: Self-pay | Admitting: Internal Medicine

## 2017-05-08 ENCOUNTER — Other Ambulatory Visit: Payer: Self-pay | Admitting: Internal Medicine

## 2017-05-08 NOTE — Progress Notes (Signed)
Chief Complaint  Patient presents with  . Follow-up   F/u  1. HTN controlled on Norvasc 5 mg qd but pt reports episodes of dizziness associated with nausea and not sure if related to medication she is taking. The room is spinning esp when riding in the car. This has happened to her 4x since last visit. She denies skipping meals. Also reviewed with pt Ambien CR could cause dizziness Also HLD on Crestor 20 mg qd  2. Insomnia ambien CR 12.5 is working whereas Lunesta 1 mg has not helped nor Restoril 15-22.5 mg qhs. But she reports her husband has caught her up in the middle of the night eating while asleep and she has no recollection of episodes  3. Reviewed labs 01/2017 2nd urine +blood pt reports she was sick with GI virus will repeat urine 4. H/o fatty liver US 2013  Of note she is about to leave for a cruise to [place] with family   Review of Systems  Constitutional: Negative for weight loss.  HENT: Negative for hearing loss.   Eyes: Negative for blurred vision.  Respiratory: Negative for shortness of breath.   Cardiovascular: Negative for chest pain.  Gastrointestinal: Positive for nausea.  Genitourinary: Negative for hematuria.  Skin: Negative for rash.  Neurological: Positive for dizziness. Negative for headaches.  Psychiatric/Behavioral: Negative for memory loss. The patient does not have insomnia.    Past Medical History:  Diagnosis Date  . Allergy   . Arthritis   . Basal cell carcinoma    face, back, neck, arm  . Basal cell carcinoma    nose, left cheek  . Chicken pox   . Complication of anesthesia    blood pressure elevated after gastric sleeve surgery in recovery, pt does not want mask over face  . Depression   . Diabetes mellitus without complication ([place])    pre-diabetic currently, was diabetic prior to gastric sleeve surgery   . Fatty liver   . GERD (gastroesophageal reflux disease)   . Gout   . History of shingles   . Hyperlipidemia   . Hypertension    no  problems since losing weight after gastric sleeve surgery  . Insomnia   . Migraine   . Obesity   . Osteopenia    Past Surgical History:  Procedure Laterality Date  . ABDOMINAL HYSTERECTOMY    . ANKLE ARTHROSCOPY Left 05/29/2016   Procedure: ANKLE ARTHROSCOPY;  Surgeon: [name], DPM;  Location: [facility];  Service: Podiatry;  Laterality: Left;  . APPENDECTOMY    . basal cell removed    . CESAREAN SECTION N/A    2  . CHOLECYSTECTOMY    . cyst removed from wrist    . FOOT SURGERY     left foot 11/2016 Dr. Laymond Purser   . MOHS SURGERY     x 2 face nose and left cheek   . OSTEOCHONDROMA EXCISION Left 05/29/2016   Procedure: OSTEOCHONDRAL DRILLING TALUS;  Surgeon: [name], DPM;  Location: [facility];  Service: Podiatry;  Laterality: Left;  . PLANTAR FASCIA RELEASE Left 05/29/2016   Procedure: ENDOSCOPIC PLANTAR FASCIOTOMY;  Surgeon: [name], DPM;  Location: [facility];  Service: Podiatry;  Laterality: Left;  . ROTATOR CUFF REPAIR     lt shoulder  . STOMACH SURGERY     gastric sleeve surgery  . TUBAL LIGATION    . wedge resection of ovary  1982   Family History  Problem Relation Age of Onset  .  Heart disease Mother   . Breast cancer Mother 74  . Arthritis Mother   . Hyperlipidemia Mother   . Heart disease Father   . Lung cancer Father   . Hyperlipidemia Father   . Hypertension Father   . Diabetes Sister        x2  . Diabetes Maternal Aunt   . Diabetes Maternal Uncle   . Diabetes Maternal Grandfather   . Hyperlipidemia Maternal Grandfather   . Arthritis Maternal Grandmother   . Sudden death Cousin   . Diabetes Maternal Aunt   . Colon cancer Neg Hx   . Ovarian cancer Neg Hx    Social History   Socioeconomic History  . Marital status: Married    Spouse name: Not on file  . Number of children: Not on file  . Years of education: Not on file  . Highest education level: Not on file  Occupational History  . Not on file  Social Needs  . Financial resource strain:  Not on file  . Food insecurity:    Worry: Not on file    Inability: Not on file  . Transportation needs:    Medical: Not on file    Non-medical: Not on file  Tobacco Use  . Smoking status: Never Smoker  . Smokeless tobacco: Never Used  Substance and Sexual Activity  . Alcohol use: No  . Drug use: No  . Sexual activity: Yes    Partners: Male    Birth control/protection: Surgical  Lifestyle  . Physical activity:    Days per week: 0 days    Minutes per session: 0 min  . Stress: Not on file  Relationships  . Social connections:    Talks on phone: Not on file    Gets together: Not on file    Attends religious service: Not on file    Active member of club or organization: Not on file    Attends meetings of clubs or organizations: Not on file    Relationship status: Not on file  . Intimate partner violence:    Fear of current or ex partner: Not on file    Emotionally abused: Not on file    Physically abused: Not on file    Forced sexual activity: Not on file  Other Topics Concern  . Not on file  Social History Narrative   Married x 37 years as of 01/2017    Kids    Write childrens books    Retiring from [employer] 01/2017    Enjoys traveling    Current Meds  Medication Sig  . amLODipine (NORVASC) 5 MG tablet Take 0.5 tablets (2.5 mg total) by mouth daily.  . Calcium-Vitamin D-Vitamin K (CALCIUM SOFT CHEWS PO) Take 1 tablet by mouth 4 (four) times a week.  . estradiol (ESTRACE) 1 MG tablet Take 1 tablet (1 mg total) by mouth daily.  . Ibuprofen-Famotidine (DUEXIS) 800-26.6 MG TABS Take 1 by mouth three times daily (Patient taking differently: Take 1 tablet by mouth 2 (two) times daily as needed (pain). )  . Multiple Vitamins-Minerals (EQ MULTIVITAMINS ADULT GUMMY PO) Take 2 tablets by mouth daily.  Marland Kitchen omeprazole (PRILOSEC) 20 MG capsule Take 20 mg by mouth daily as needed (indigestion).  . rosuvastatin (CRESTOR) 20 MG tablet Take 1 tablet (20 mg total) by mouth at bedtime.  Marland Kitchen  zolpidem (AMBIEN CR) 12.5 MG CR tablet Take 1 tablet (12.5 mg total) by mouth at bedtime as needed for sleep.  . [DISCONTINUED] amLODipine (  NORVASC) 5 MG tablet Take 1 tablet (5 mg total) by mouth daily. (Patient taking differently: Take 2.5 mg by mouth daily. )   Current Facility-Administered Medications for the 05/06/17 encounter (Office Visit) with McLean-Scocuzza, Nino Glow, MD  Medication  . betamethasone acetate-betamethasone sodium phosphate (CELESTONE) injection 3 mg  . betamethasone acetate-betamethasone sodium phosphate (CELESTONE) injection 3 mg  . betamethasone acetate-betamethasone sodium phosphate (CELESTONE) injection 3 mg   Allergies  Allergen Reactions  . Tetracyclines & Related Swelling and Rash    FACE SWELL, RASH ON CHEST  . Alcohol Rash    ABDOMINAL CRAMPS, drinking alcohol  . Metoprolol Diarrhea   Recent Results (from the past 2160 hour(s))  CBC with Differential/Platelet     Status: None   Collection Time: 03/02/17 10:14 AM  Result Value Ref Range   WBC 8.3 3.4 - 10.8 x10E3/uL   RBC 4.67 3.77 - 5.28 x10E6/uL   Hemoglobin 13.5 11.1 - 15.9 g/dL   Hematocrit 41.0 34.0 - 46.6 %   MCV 88 79 - 97 fL   MCH 28.9 26.6 - 33.0 pg   MCHC 32.9 31.5 - 35.7 g/dL   RDW 14.1 12.3 - 15.4 %   Platelets 267 150 - 379 x10E3/uL   Neutrophils 54 Not Estab. %   Lymphs 35 Not Estab. %   Monocytes 8 Not Estab. %   Eos 2 Not Estab. %   Basos 1 Not Estab. %   Neutrophils Absolute 4.5 1.4 - 7.0 x10E3/uL   Lymphocytes Absolute 2.9 0.7 - 3.1 x10E3/uL   Monocytes Absolute 0.7 0.1 - 0.9 x10E3/uL   EOS (ABSOLUTE) 0.1 0.0 - 0.4 x10E3/uL   Basophils Absolute 0.1 0.0 - 0.2 x10E3/uL   Immature Granulocytes 0 Not Estab. %   Immature Grans (Abs) 0.0 0.0 - 0.1 N62X5/MW  Basic metabolic panel     Status: None   Collection Time: 03/02/17 10:14 AM  Result Value Ref Range   Glucose 91 65 - 99 mg/dL   BUN 11 6 - 24 mg/dL   Creatinine, Ser 0.61 0.57 - 1.00 mg/dL   GFR calc non Af Amer 101 >59  mL/min/1.73   GFR calc Af Amer 116 >59 mL/min/1.73   BUN/Creatinine Ratio 18 9 - 23   Sodium 141 134 - 144 mmol/L   Potassium 4.5 3.5 - 5.2 mmol/L   Chloride 102 96 - 106 mmol/L   CO2 24 20 - 29 mmol/L   Calcium 9.2 8.7 - 10.2 mg/dL  Hgb A1c w/o eAG     Status: Abnormal   Collection Time: 03/02/17 10:14 AM  Result Value Ref Range   Hgb A1c MFr Bld 5.9 (H) 4.8 - 5.6 %    Comment:          Prediabetes: 5.7 - 6.4          Diabetes: >6.4          Glycemic control for adults with diabetes: <7.0   Urinalysis, Routine w reflex microscopic     Status: None   Collection Time: 05/08/17  8:05 AM  Result Value Ref Range   Specific Gravity, UA 1.021 1.005 - 1.030   pH, UA 6.0 5.0 - 7.5   Color, UA Yellow Yellow   Appearance Ur Clear Clear   Leukocytes, UA Negative Negative   Protein, UA Negative Negative/Trace   Glucose, UA Negative Negative   Ketones, UA Negative Negative   RBC, UA Negative Negative   Bilirubin, UA Negative Negative   Urobilinogen, Ur 0.2 0.2 -  1.0 mg/dL   Nitrite, UA Negative Negative   Microscopic Examination Comment     Comment: Microscopic not indicated and not performed.  Lipid Panel w/o Chol/HDL Ratio     Status: None (Preliminary result)   Collection Time: 05/08/17  8:05 AM  Result Value Ref Range   Cholesterol, Total WILL FOLLOW    Triglycerides WILL FOLLOW    HDL WILL FOLLOW    VLDL Cholesterol Cal WILL FOLLOW    LDL Calculated WILL FOLLOW    Comment: WILL FOLLOW    Objective  Body mass index is 32.45 kg/m. Wt Readings from Last 3 Encounters:  05/06/17 177 lb 6.4 oz (80.5 kg)  03/02/17 176 lb 3.2 oz (79.9 kg)  02/10/17 172 lb 6.4 oz (78.2 kg)   Temp Readings from Last 3 Encounters:  05/06/17 98.2 F (36.8 C) (Oral)  03/02/17 98.3 F (36.8 C) (Oral)  02/01/17 97.6 F (36.4 C)   BP Readings from Last 3 Encounters:  05/06/17 136/66  03/02/17 (!) 150/70  02/10/17 (!) 164/83   Pulse Readings from Last 3 Encounters:  05/06/17 68  03/02/17 74   02/10/17 80    Physical Exam  Constitutional: She is oriented to person, place, and time. Vital signs are normal. She appears well-developed and well-nourished. She is cooperative.  HENT:  Head: Normocephalic and atraumatic.  Mouth/Throat: Oropharynx is clear and moist and mucous membranes are normal.  Eyes: Pupils are equal, round, and reactive to light. Conjunctivae are normal.  Cardiovascular: Normal rate, regular rhythm and normal heart sounds.  Pulmonary/Chest: Effort normal and breath sounds normal.  Neurological: She is alert and oriented to person, place, and time. Gait normal.  CN 2-12 grossly intact  Normal motor strength upper and lower ext b/l   Skin: Skin is warm, dry and intact.  Psychiatric: She has a normal mood and affect. Her speech is normal and behavior is normal. Judgment and thought content normal. Cognition and memory are normal.  Nursing note and vitals reviewed.   Assessment   1. HTN/HLD  2. Insomnia improved  3. Hematuria  4. Fatty liver  5. Dizziness ddx med related vs vertigo  6. HM Plan  1. Cut norvasc in 1/2 take 2.5 mg qd Cont crestor 20 mg  Repeat lipid labcrop 2. Pt wants to continue ambien CR qhs  Despite sleep walking she reports husband has set up bells so he hears her going to the refrigerator  Prev lunesta 1 mg and restorial 15-22.5 did not help with sleep  3. Repeat Ua [facility] 4.  rec exercise to lose she is in water aerobics M, W,F  rec healthy diet choices  Hep B immune  Given Hep A vaccine today will need 2nd dose 5/17 and 3rd 10/17 hep A 5.  Reduce norvasc to 2.5  Trial of meclizine prn do not mix with ambien space out by 12 hours disc wit hpt today  6.  Had flu and Tdap  Disc shingrix prev and today  hep A vaccine 1/3 given today, hep B immune  Hep C neg   Never smoker, chew congratulated not doing either  mammo sch 05/11/17  Colonoscopy -obtained records 07/15/11 colonoscopy Dr. Candace Cruise nl repeat in 10 years Pap s/p  hysterectomy ? Cervix intact has appt Dr. Enzo Bi 02/10/17. -Appt went well 01/2017 ob/gyn f/u in 1 year  -per not pap not needed   Dermatology sees [facility] skin q6 months h/o BCC nose and left cheek s/p Mohs and h/o Aks  Of not she saw [facility] late March 2019 and had eye glasses checked   Provider: Dr. Olivia Mackie McLean-Scocuzza-Internal Medicine

## 2017-05-09 LAB — LIPID PANEL W/O CHOL/HDL RATIO
CHOLESTEROL TOTAL: 163 mg/dL (ref 100–199)
HDL: 74 mg/dL (ref >39)
LDL Calculated: 61 mg/dL (ref 0–99)
TRIGLYCERIDES: 138 mg/dL (ref 0–149)
VLDL Cholesterol Cal: 28 mg/dL (ref 5–40)

## 2017-05-09 LAB — URINALYSIS, ROUTINE W REFLEX MICROSCOPIC
BILIRUBIN UA: NEGATIVE
GLUCOSE, UA: NEGATIVE
KETONES UA: NEGATIVE
Leukocytes, UA: NEGATIVE
NITRITE UA: NEGATIVE
Protein, UA: NEGATIVE
RBC UA: NEGATIVE
SPEC GRAV UA: 1.021 (ref 1.005–1.030)
UUROB: 0.2 mg/dL (ref 0.2–1.0)
pH, UA: 6 (ref 5.0–7.5)

## 2017-05-11 ENCOUNTER — Ambulatory Visit
Admission: RE | Admit: 2017-05-11 | Discharge: 2017-05-11 | Disposition: A | Discharge status: Home / Self Care | Payer: No Typology Code available for payment source | Source: Ambulatory Visit | Attending: Obstetrics and Gynecology | Admitting: Obstetrics and Gynecology

## 2017-05-11 DIAGNOSIS — Z1231 Encounter for screening mammogram for malignant neoplasm of breast: Secondary | ICD-10-CM | POA: Insufficient documentation

## 2017-05-11 DIAGNOSIS — Z1239 Encounter for other screening for malignant neoplasm of breast: Secondary | ICD-10-CM

## 2017-05-12 ENCOUNTER — Telehealth: Payer: Self-pay | Admitting: *Deleted

## 2017-05-12 NOTE — Telephone Encounter (Signed)
-----   Message from Delorise Jackson, MD sent at 05/08/2017  8:43 PM EDT ----- Please make sure pt only had hep A vaccine she is good on Hep B vaccine and change order in computer and please get back to me   Further appts needs hep A vaccine only in 1 month and 6 months   Thanks tMS

## 2017-05-13 NOTE — Telephone Encounter (Signed)
Patient was informed of results.  Patient understood and no questions, comments, or concerns at this time.  

## 2017-05-13 NOTE — Telephone Encounter (Signed)
-----   Message from Delorise Jackson, MD sent at 05/11/2017  5:57 PM EDT ----- Labs urine normal  Cholesterol #s improved great job  -continue healthy diet choices and exercise   [place]

## 2017-05-14 ENCOUNTER — Other Ambulatory Visit: Payer: Self-pay

## 2017-05-14 MED ORDER — MELOXICAM 15 MG PO TABS: 15.0000 mg | ORAL_TABLET | Freq: Every day | ORAL | 3 refills | Status: DC

## 2017-05-14 NOTE — Telephone Encounter (Signed)
Pharmacy refill request for Meloxicam.  Per Dr. Evans, ok to refill.   Script has been sent to pharmacy 

## 2017-05-18 NOTE — Addendum Note (Signed)
Addended by: Nanci Pina on: 05/18/2017 08:26 AM   Modules accepted: Orders

## 2017-05-29 ENCOUNTER — Other Ambulatory Visit: Payer: Self-pay | Admitting: Internal Medicine

## 2017-05-29 DIAGNOSIS — I10 Essential (primary) hypertension: Secondary | ICD-10-CM

## 2017-05-29 MED ORDER — AMLODIPINE BESYLATE 2.5 MG PO TABS: 2.5000 mg | ORAL_TABLET | Freq: Every day | ORAL | 1 refills | Status: DC

## 2017-06-01 ENCOUNTER — Other Ambulatory Visit: Payer: Self-pay | Admitting: Internal Medicine

## 2017-06-01 DIAGNOSIS — I10 Essential (primary) hypertension: Secondary | ICD-10-CM

## 2017-06-01 MED ORDER — AMLODIPINE BESYLATE 2.5 MG PO TABS: 2.5000 mg | ORAL_TABLET | Freq: Every day | ORAL | 1 refills | Status: DC

## 2017-06-09 ENCOUNTER — Ambulatory Visit: Payer: No Typology Code available for payment source

## 2017-06-18 ENCOUNTER — Telehealth: Payer: Self-pay | Admitting: Internal Medicine

## 2017-06-18 ENCOUNTER — Other Ambulatory Visit: Payer: Self-pay | Admitting: Internal Medicine

## 2017-06-18 DIAGNOSIS — G4700 Insomnia, unspecified: Secondary | ICD-10-CM

## 2017-06-18 MED ORDER — ZOLPIDEM TARTRATE ER 12.5 MG PO TBCR: 12.5000 mg | EXTENDED_RELEASE_TABLET | Freq: Every evening | ORAL | 2 refills | Status: DC | PRN

## 2017-06-18 NOTE — Telephone Encounter (Signed)
Please cancel hep A vaccine 05/11/2018 she only needs 2 doses of Havrix 0 month and then 2nd dose 6-12 months later   Thanks [place]

## 2017-06-19 NOTE — Telephone Encounter (Signed)
Appointment canceled.

## 2017-06-30 ENCOUNTER — Other Ambulatory Visit: Payer: Self-pay | Admitting: Orthopedic Surgery

## 2017-06-30 DIAGNOSIS — M25522 Pain in left elbow: Principal | ICD-10-CM

## 2017-06-30 DIAGNOSIS — M7712 Lateral epicondylitis, left elbow: Secondary | ICD-10-CM

## 2017-06-30 DIAGNOSIS — G8929 Other chronic pain: Secondary | ICD-10-CM

## 2017-07-15 ENCOUNTER — Ambulatory Visit
Admission: RE | Admit: 2017-07-15 | Discharge: 2017-07-15 | Disposition: A | Discharge status: Home / Self Care | Payer: No Typology Code available for payment source | Source: Ambulatory Visit | Attending: Orthopedic Surgery | Admitting: Orthopedic Surgery

## 2017-07-15 DIAGNOSIS — M25522 Pain in left elbow: Principal | ICD-10-CM

## 2017-07-15 DIAGNOSIS — G8929 Other chronic pain: Secondary | ICD-10-CM

## 2017-07-15 DIAGNOSIS — M7712 Lateral epicondylitis, left elbow: Secondary | ICD-10-CM

## 2017-07-22 ENCOUNTER — Other Ambulatory Visit: Payer: Self-pay | Admitting: Internal Medicine

## 2017-07-22 DIAGNOSIS — E785 Hyperlipidemia, unspecified: Secondary | ICD-10-CM

## 2017-07-22 MED ORDER — ROSUVASTATIN CALCIUM 20 MG PO TABS: 20.0000 mg | ORAL_TABLET | Freq: Every day | ORAL | 3 refills | Status: DC

## 2017-07-30 ENCOUNTER — Other Ambulatory Visit: Payer: Self-pay

## 2017-07-30 ENCOUNTER — Encounter
Admission: RE | Admit: 2017-07-30 | Discharge: 2017-07-30 | Disposition: A | Discharge status: Home / Self Care | Payer: No Typology Code available for payment source | Source: Ambulatory Visit | Attending: Unknown Physician Specialty | Admitting: Unknown Physician Specialty

## 2017-07-30 DIAGNOSIS — E785 Hyperlipidemia, unspecified: Secondary | ICD-10-CM | POA: Insufficient documentation

## 2017-07-30 DIAGNOSIS — I10 Essential (primary) hypertension: Secondary | ICD-10-CM | POA: Diagnosis not present

## 2017-07-30 DIAGNOSIS — M7712 Lateral epicondylitis, left elbow: Secondary | ICD-10-CM | POA: Insufficient documentation

## 2017-07-30 DIAGNOSIS — E119 Type 2 diabetes mellitus without complications: Secondary | ICD-10-CM | POA: Insufficient documentation

## 2017-07-30 DIAGNOSIS — Z01818 Encounter for other preprocedural examination: Secondary | ICD-10-CM | POA: Diagnosis present

## 2017-07-30 NOTE — Patient Instructions (Signed)
Your procedure is scheduled on: Wednesday 08/05/17 Report to [facility]. To find out your arrival time please call [phone] between 1PM - 3PM on Tuesday 08/04/17.  Remember: Instructions that are not followed completely may result in serious medical risk, up to and including death, or upon the discretion of your surgeon and anesthesiologist your surgery may need to be rescheduled.     _X__ 1. Do not eat food after midnight the night before your procedure.                 No gum chewing or hard candies. You may drink clear liquids up to 2 hours                 before you are scheduled to arrive for your surgery- DO not drink clear                 liquids within 2 hours of the start of your surgery.                 Clear Liquids include:  water, apple juice without pulp, clear carbohydrate                 drink such as Clearfast or Gatorade, Black Coffee or Tea (Do not add                 anything to coffee or tea).  __X__2.  On the morning of surgery brush your teeth with toothpaste and water, you                 may rinse your mouth with mouthwash if you wish.  Do not swallow any              toothpaste of mouthwash.     _X__ 3.  No Alcohol for 24 hours before or after surgery.   _X__ 4.  Do Not Smoke or use e-cigarettes For 24 Hours Prior to Your Surgery.                 Do not use any chewable tobacco products for at least 6 hours prior to                 surgery.  ____  5.  Bring all medications with you on the day of surgery if instructed.   __X__  6.  Notify your doctor if there is any change in your medical condition      (cold, fever, infections).     Do not wear jewelry, make-up, hairpins, clips or nail polish. Do not wear lotions, powders, or perfumes.  Do not shave 48 hours prior to surgery. Men may shave face and neck. Do not bring valuables to the hospital.    [facility] is not responsible for any belongings or  valuables.  Contacts, dentures/partials or body piercings may not be worn into surgery. Bring a case for your contacts, glasses or hearing aids, a denture cup will be supplied. Leave your suitcase in the car. After surgery it may be brought to your room. For patients admitted to the hospital, discharge time is determined by your treatment team.   Patients discharged the day of surgery will not be allowed to drive home.   Please read over the following fact sheets that you were given:   MRSA Information  __X__ Take these medicines the morning of surgery with A SIP OF WATER:  1. Omeprazole  2. May take Claritin if needed  3.   4.  5.  6.  ____ Fleet Enema (as directed)   __X__ Use CHG Soap/SAGE wipes as directed  ____ Use inhalers on the day of surgery  ____ Stop metformin/Janumet/Farxiga 2 days prior to surgery    ____ Take 1/2 of usual insulin dose the night before surgery. No insulin the morning          of surgery.   ____ Stop Blood Thinners Coumadin/Plavix/Xarelto/Pleta/Pradaxa/Eliquis/Effient/Aspirin  on   Or contact your Surgeon, Cardiologist or Medical Doctor regarding  ability to stop your blood thinners  __X__ Stop Anti-inflammatories 7 days before surgery such as Advil, Ibuprofen, Motrin,  BC or Goodies Powder, Naprosyn, Naproxen, Aleve, Aspirin    __X__ Stop all herbal supplements, fish oil or vitamin E until after surgery. Stop melatonin, ibuprofen today   ____ Bring C-Pap to the hospital.

## 2017-08-05 ENCOUNTER — Ambulatory Visit: Payer: No Typology Code available for payment source | Admitting: Certified Registered Nurse Anesthetist

## 2017-08-05 ENCOUNTER — Encounter
Admission: RE | Disposition: A | Discharge status: Home / Self Care | Payer: Self-pay | Source: Ambulatory Visit | Attending: Unknown Physician Specialty

## 2017-08-05 ENCOUNTER — Ambulatory Visit
Admission: RE | Admit: 2017-08-05 | Discharge: 2017-08-05 | Disposition: A | Discharge status: Home / Self Care | Payer: No Typology Code available for payment source | Source: Ambulatory Visit | Attending: Unknown Physician Specialty | Admitting: Unknown Physician Specialty

## 2017-08-05 ENCOUNTER — Other Ambulatory Visit: Payer: Self-pay

## 2017-08-05 DIAGNOSIS — E1151 Type 2 diabetes mellitus with diabetic peripheral angiopathy without gangrene: Secondary | ICD-10-CM | POA: Insufficient documentation

## 2017-08-05 DIAGNOSIS — K219 Gastro-esophageal reflux disease without esophagitis: Secondary | ICD-10-CM | POA: Diagnosis not present

## 2017-08-05 DIAGNOSIS — Z791 Long term (current) use of non-steroidal anti-inflammatories (NSAID): Secondary | ICD-10-CM | POA: Insufficient documentation

## 2017-08-05 DIAGNOSIS — I10 Essential (primary) hypertension: Secondary | ICD-10-CM | POA: Diagnosis not present

## 2017-08-05 DIAGNOSIS — Z6831 Body mass index (BMI) 31.0-31.9, adult: Secondary | ICD-10-CM | POA: Diagnosis not present

## 2017-08-05 DIAGNOSIS — E669 Obesity, unspecified: Secondary | ICD-10-CM | POA: Insufficient documentation

## 2017-08-05 DIAGNOSIS — G4733 Obstructive sleep apnea (adult) (pediatric): Secondary | ICD-10-CM | POA: Diagnosis not present

## 2017-08-05 DIAGNOSIS — Z85828 Personal history of other malignant neoplasm of skin: Secondary | ICD-10-CM | POA: Diagnosis not present

## 2017-08-05 DIAGNOSIS — Z79890 Hormone replacement therapy (postmenopausal): Secondary | ICD-10-CM | POA: Diagnosis not present

## 2017-08-05 DIAGNOSIS — M7712 Lateral epicondylitis, left elbow: Secondary | ICD-10-CM | POA: Diagnosis not present

## 2017-08-05 DIAGNOSIS — E785 Hyperlipidemia, unspecified: Secondary | ICD-10-CM | POA: Diagnosis not present

## 2017-08-05 HISTORY — PX: TENNIS ELBOW RELEASE/NIRSCHEL PROCEDURE: SHX6651

## 2017-08-05 LAB — BASIC METABOLIC PANEL
ANION GAP: 6 (ref 5–15)
BUN: 16 mg/dL (ref 6–20)
CO2: 26 mmol/L (ref 22–32)
Calcium: 8.4 mg/dL — ABNORMAL LOW (ref 8.9–10.3)
Chloride: 107 mmol/L (ref 98–111)
Creatinine, Ser: 0.64 mg/dL (ref 0.44–1.00)
GFR calc Af Amer: >60 mL/min (ref >60)
GLUCOSE: 99 mg/dL (ref 70–99)
POTASSIUM: 3.5 mmol/L (ref 3.5–5.1)
Sodium: 139 mmol/L (ref 135–145)

## 2017-08-05 LAB — CBC
HCT: 40.1 % (ref 35.0–47.0)
Hemoglobin: 13.6 g/dL (ref 12.0–16.0)
MCH: 29.6 pg (ref 26.0–34.0)
MCHC: 33.9 g/dL (ref 32.0–36.0)
MCV: 87.4 fL (ref 80.0–100.0)
Platelets: 293 10*3/uL (ref 150–440)
RBC: 4.59 MIL/uL (ref 3.80–5.20)
RDW: 13.8 % (ref 11.5–14.5)
WBC: 8.6 10*3/uL (ref 3.6–11.0)

## 2017-08-05 SURGERY — TENNIS ELBOW RELEASE/NIRSCHEL PROCEDURE
Anesthesia: General | Site: Elbow | Laterality: Left | Wound class: Clean

## 2017-08-05 MED ORDER — GLYCOPYRROLATE 0.2 MG/ML IJ SOLN
INTRAMUSCULAR | Status: DC | PRN
  Administered 2017-08-05: 0.2 mg via INTRAVENOUS

## 2017-08-05 MED ORDER — FENTANYL CITRATE (PF) 100 MCG/2ML IJ SOLN
INTRAMUSCULAR | Status: AC
  Filled 2017-08-05: qty 2

## 2017-08-05 MED ORDER — FENTANYL CITRATE (PF) 100 MCG/2ML IJ SOLN
INTRAMUSCULAR | Status: AC
  Administered 2017-08-05: 25 ug via INTRAVENOUS
  Filled 2017-08-05: qty 2

## 2017-08-05 MED ORDER — FENTANYL CITRATE (PF) 100 MCG/2ML IJ SOLN
25.0000 ug | INTRAMUSCULAR | Status: AC | PRN
  Administered 2017-08-05 (×6): 25 ug via INTRAVENOUS

## 2017-08-05 MED ORDER — SODIUM CHLORIDE 0.9 % IV SOLN
INTRAVENOUS | Status: DC
  Administered 2017-08-05 (×2): via INTRAVENOUS

## 2017-08-05 MED ORDER — OXYCODONE HCL 5 MG PO TABS
ORAL_TABLET | ORAL | Status: AC
  Administered 2017-08-05: 5 mg via ORAL
  Filled 2017-08-05: qty 1

## 2017-08-05 MED ORDER — DEXAMETHASONE SODIUM PHOSPHATE 10 MG/ML IJ SOLN
INTRAMUSCULAR | Status: DC | PRN
  Administered 2017-08-05: 4 mg via INTRAVENOUS

## 2017-08-05 MED ORDER — BUPIVACAINE HCL (PF) 0.5 % IJ SOLN
INTRAMUSCULAR | Status: AC
  Filled 2017-08-05: qty 30

## 2017-08-05 MED ORDER — OXYCODONE HCL 5 MG/5ML PO SOLN: 5.0000 mg | Freq: Once | ORAL | Status: AC | PRN

## 2017-08-05 MED ORDER — PROPOFOL 10 MG/ML IV BOLUS
INTRAVENOUS | Status: AC
  Filled 2017-08-05: qty 20

## 2017-08-05 MED ORDER — ONDANSETRON HCL 4 MG/2ML IJ SOLN
INTRAMUSCULAR | Status: AC
  Filled 2017-08-05: qty 2

## 2017-08-05 MED ORDER — PHENYLEPHRINE HCL 10 MG/ML IJ SOLN
INTRAMUSCULAR | Status: DC | PRN
  Administered 2017-08-05: 100 ug via INTRAVENOUS
  Administered 2017-08-05: 50 ug via INTRAVENOUS
  Administered 2017-08-05: 200 ug via INTRAVENOUS
  Administered 2017-08-05: 50 ug via INTRAVENOUS
  Administered 2017-08-05: 100 ug via INTRAVENOUS
  Administered 2017-08-05: 200 ug via INTRAVENOUS
  Administered 2017-08-05: 100 ug via INTRAVENOUS

## 2017-08-05 MED ORDER — MIDAZOLAM HCL 2 MG/2ML IJ SOLN
INTRAMUSCULAR | Status: AC
  Filled 2017-08-05: qty 2

## 2017-08-05 MED ORDER — ONDANSETRON HCL 4 MG/2ML IJ SOLN
INTRAMUSCULAR | Status: DC | PRN
  Administered 2017-08-05: 4 mg via INTRAVENOUS

## 2017-08-05 MED ORDER — PHENYLEPHRINE HCL 10 MG/ML IJ SOLN
INTRAMUSCULAR | Status: AC
  Filled 2017-08-05: qty 1

## 2017-08-05 MED ORDER — NORCO 5-325 MG PO TABS: 1.0000 | ORAL_TABLET | Freq: Four times a day (QID) | ORAL | 0 refills | Status: AC | PRN

## 2017-08-05 MED ORDER — GLYCOPYRROLATE 0.2 MG/ML IJ SOLN
INTRAMUSCULAR | Status: AC
  Filled 2017-08-05: qty 1

## 2017-08-05 MED ORDER — LIDOCAINE HCL (CARDIAC) PF 100 MG/5ML IV SOSY
PREFILLED_SYRINGE | INTRAVENOUS | Status: DC | PRN
  Administered 2017-08-05: 100 mg via INTRAVENOUS

## 2017-08-05 MED ORDER — PROPOFOL 10 MG/ML IV BOLUS
INTRAVENOUS | Status: DC | PRN
  Administered 2017-08-05: 160 mg via INTRAVENOUS

## 2017-08-05 MED ORDER — FENTANYL CITRATE (PF) 100 MCG/2ML IJ SOLN
INTRAMUSCULAR | Status: DC | PRN
  Administered 2017-08-05: 50 ug via INTRAVENOUS
  Administered 2017-08-05 (×2): 25 ug via INTRAVENOUS

## 2017-08-05 MED ORDER — LIDOCAINE HCL (PF) 2 % IJ SOLN
INTRAMUSCULAR | Status: AC
  Filled 2017-08-05: qty 10

## 2017-08-05 MED ORDER — MIDAZOLAM HCL 2 MG/2ML IJ SOLN
INTRAMUSCULAR | Status: DC | PRN
  Administered 2017-08-05: 2 mg via INTRAVENOUS

## 2017-08-05 MED ORDER — OXYCODONE HCL 5 MG PO TABS
5.0000 mg | ORAL_TABLET | Freq: Once | ORAL | Status: AC | PRN
  Administered 2017-08-05: 5 mg via ORAL

## 2017-08-05 MED ORDER — FENTANYL CITRATE (PF) 100 MCG/2ML IJ SOLN
25.0000 ug | INTRAMUSCULAR | Status: AC | PRN
  Administered 2017-08-05 (×2): 25 ug via INTRAVENOUS

## 2017-08-05 MED ORDER — DEXAMETHASONE SODIUM PHOSPHATE 10 MG/ML IJ SOLN
INTRAMUSCULAR | Status: AC
  Filled 2017-08-05: qty 1

## 2017-08-05 MED ORDER — BUPIVACAINE HCL (PF) 0.5 % IJ SOLN
INTRAMUSCULAR | Status: DC | PRN
  Administered 2017-08-05: 10 mL

## 2017-08-05 SURGICAL SUPPLY — 34 items
BANDAGE ELASTIC 4 LF NS (GAUZE/BANDAGES/DRESSINGS) ×4 IMPLANT
BLADE SURG SZ10 CARB STEEL (BLADE) ×2 IMPLANT
BNDG COHESIVE 4X5 TAN STRL (GAUZE/BANDAGES/DRESSINGS) ×2 IMPLANT
BNDG ESMARK 4X12 TAN STRL LF (GAUZE/BANDAGES/DRESSINGS) ×2 IMPLANT
BUR 4X45 EGG (BURR) ×2 IMPLANT
CANISTER SUCT 1200ML W/VALVE (MISCELLANEOUS) ×2 IMPLANT
CUFF DUAL TOURNIQUET 18IN DISP (TOURNIQUET CUFF) IMPLANT
CUFF TOURN 24 STER (MISCELLANEOUS) IMPLANT
DRAPE INCISE IOBAN 66X45 STRL (DRAPES) ×2 IMPLANT
DURAPREP 26ML APPLICATOR (WOUND CARE) ×2 IMPLANT
ELECT REM PT RETURN 9FT ADLT (ELECTROSURGICAL) ×2
ELECTRODE REM PT RTRN 9FT ADLT (ELECTROSURGICAL) ×1 IMPLANT
GAUZE SPONGE 4X4 12PLY STRL (GAUZE/BANDAGES/DRESSINGS) ×2 IMPLANT
GLOVE BIO SURGEON STRL SZ8 (GLOVE) ×4 IMPLANT
GLOVE BIOGEL M STRL SZ7.5 (GLOVE) ×2 IMPLANT
GLOVE INDICATOR 8.0 STRL GRN (GLOVE) ×2 IMPLANT
GOWN STRL REUS W/TWL LRG LVL4 (GOWN DISPOSABLE) ×4 IMPLANT
KIT TURNOVER KIT A (KITS) ×2 IMPLANT
NS IRRIG 500ML POUR BTL (IV SOLUTION) ×2 IMPLANT
PACK EXTREMITY ARMC (MISCELLANEOUS) ×2 IMPLANT
SOL PREP PVP 2OZ (MISCELLANEOUS)
SOLUTION PREP PVP 2OZ (MISCELLANEOUS) IMPLANT
SPLINT CAST 1 STEP 5X30 WHT (MISCELLANEOUS) ×2 IMPLANT
SPONGE LAP 18X18 RF (DISPOSABLE) ×2 IMPLANT
STAPLER SKIN PROX 35W (STAPLE) ×2 IMPLANT
STOCKINETTE IMPERV 14X48 (MISCELLANEOUS) ×2 IMPLANT
SUT ETHILON 3-0 FS-10 30 BLK (SUTURE) ×2
SUT VIC AB 0 CT1 27 (SUTURE) ×1
SUT VIC AB 0 CT1 27XCR 8 STRN (SUTURE) ×1 IMPLANT
SUT VIC AB 2-0 SH 27 (SUTURE) ×1
SUT VIC AB 2-0 SH 27XBRD (SUTURE) ×1 IMPLANT
SUTURE EHLN 3-0 FS-10 30 BLK (SUTURE) ×1 IMPLANT
SYR 10ML LL (SYRINGE) IMPLANT
TUBING CONNECTING 10 (TUBING) ×2 IMPLANT

## 2017-08-05 NOTE — Anesthesia Preprocedure Evaluation (Signed)
Anesthesia Evaluation  Patient identified by MRN, date of birth, ID band Patient awake    Reviewed: Allergy & Precautions, H&P , NPO status , Patient's Chart, lab work & pertinent test results  History of Anesthesia Complications (+) history of anesthetic complications  Airway Mallampati: III  TM Distance: <3 FB Neck ROM: full    Dental  (+) Chipped   Pulmonary neg pulmonary ROS, neg shortness of breath,           Cardiovascular Exercise Tolerance: Good hypertension, (-) angina+ Peripheral Vascular Disease  (-) Past MI and (-) DOE      Neuro/Psych  Headaches, PSYCHIATRIC DISORDERS Anxiety Depression    GI/Hepatic Neg liver ROS, GERD  Medicated and Controlled,  Endo/Other  diabetes, Type 2  Renal/GU      Musculoskeletal  (+) Arthritis ,   Abdominal   Peds  Hematology negative hematology ROS (+)   Anesthesia Other Findings Past Medical History: No date: Allergy No date: Arthritis No date: Basal cell carcinoma     Comment:  face, back, neck, arm No date: Basal cell carcinoma     Comment:  nose, left cheek No date: Chicken pox No date: Complication of anesthesia     Comment:  blood pressure elevated after gastric sleeve surgery in               recovery, pt does not want mask over face No date: Depression No date: Diabetes mellitus without complication ([facility])     Comment:  pre-diabetic currently, was diabetic prior to gastric               sleeve surgery  No date: Fatty liver No date: GERD (gastroesophageal reflux disease) No date: Gout No date: History of shingles No date: Hyperlipidemia No date: Hypertension     Comment:  no problems since losing weight after gastric sleeve               surgery No date: Insomnia No date: Migraine No date: Obesity No date: Osteopenia  Past Surgical History: No date: ABDOMINAL HYSTERECTOMY 05/29/2016: ANKLE ARTHROSCOPY; Left     Comment:  Procedure: ANKLE  ARTHROSCOPY;  Surgeon: [name],               DPM;  Location: [facility];  Service: Podiatry;  Laterality:               Left; No date: APPENDECTOMY No date: basal cell removed No date: CESAREAN SECTION; N/A     Comment:  2 No date: CHOLECYSTECTOMY No date: cyst removed from wrist No date: FOOT SURGERY     Comment:  left foot 11/2016 Dr. Laymond Purser  No date: MOHS SURGERY     Comment:  x 2 face nose and left cheek  05/29/2016: OSTEOCHONDROMA EXCISION; Left     Comment:  Procedure: OSTEOCHONDRAL DRILLING TALUS;  Surgeon:               [name], DPM;  Location: [facility];  Service:               Podiatry;  Laterality: Left; 05/29/2016: PLANTAR FASCIA RELEASE; Left     Comment:  Procedure: ENDOSCOPIC PLANTAR FASCIOTOMY;  Surgeon:               [name], DPM;  Location: [facility];  Service:               Podiatry;  Laterality: Left; No date: ROTATOR CUFF REPAIR  Comment:  lt shoulder No date: STOMACH SURGERY     Comment:  gastric sleeve surgery No date: TUBAL LIGATION 1982: wedge resection of ovary  BMI    Body Mass Index:  31.18 kg/m      Reproductive/Obstetrics negative OB ROS                             Anesthesia Physical Anesthesia Plan  ASA: III  Anesthesia Plan: General   Post-op Pain Management:    Induction: Intravenous  PONV Risk Score and Plan: Ondansetron, Dexamethasone, Midazolam and Treatment may vary due to age or medical condition  Airway Management Planned: Natural Airway and Nasal Cannula  Additional Equipment:   Intra-op Plan:   Post-operative Plan:   Informed Consent: I have reviewed the patients History and Physical, chart, labs and discussed the procedure including the risks, benefits and alternatives for the proposed anesthesia with the patient or authorized representative who has indicated his/her understanding and acceptance.   Dental Advisory Given  Plan Discussed with: Anesthesiologist, CRNA and  Surgeon  Anesthesia Plan Comments: (Patient consented for risks of anesthesia including but not limited to:  - adverse reactions to medications - risk of intubation if required - damage to teeth, lips or other oral mucosa - sore throat or hoarseness - Damage to heart, brain, lungs or loss of life  Patient voiced understanding.)        Anesthesia Quick Evaluation

## 2017-08-05 NOTE — Anesthesia Post-op Follow-up Note (Signed)
Anesthesia QCDR form completed.        

## 2017-08-05 NOTE — Anesthesia Procedure Notes (Signed)
Procedure Name: LMA Insertion Date/Time: 08/05/2017 7:37 AM Performed by: Rudean Hitt, CRNA Pre-anesthesia Checklist: Patient identified, Emergency Drugs available, Suction available and Patient being monitored Patient Re-evaluated:Patient Re-evaluated prior to induction Oxygen Delivery Method: Circle system utilized Preoxygenation: Pre-oxygenation with 100% oxygen Induction Type: IV induction Ventilation: Mask ventilation without difficulty LMA: LMA inserted LMA Size: 3.5 Placement Confirmation: positive ETCO2,  CO2 detector and breath sounds checked- equal and bilateral Tube secured with: Tape Dental Injury: Teeth and Oropharynx as per pre-operative assessment

## 2017-08-05 NOTE — Transfer of Care (Signed)
Immediate Anesthesia Transfer of Care Note  Patient: Kristin [name]  Procedure(s) Performed: TENNIS ELBOW RELEASE/NIRSCHEL PROCEDURE (Left Elbow)  Patient Location: PACU  Anesthesia Type:General  Level of Consciousness: awake, drowsy and patient cooperative  Airway & Oxygen Therapy: Patient Spontanous Breathing and Patient connected to nasal cannula oxygen  Post-op Assessment: Report given to RN and Post -op Vital signs reviewed and stable  Post vital signs: Reviewed and stable  Last Vitals:  Vitals Value Taken Time  BP 134/79 08/05/2017  8:58 AM  Temp 36.3 C 08/05/2017  8:58 AM  Pulse 72 08/05/2017  9:00 AM  Resp 18 08/05/2017  9:00 AM  SpO2 100 % 08/05/2017  9:00 AM  Vitals shown include unvalidated device data.  Last Pain:  Vitals:   08/05/17 0643  TempSrc: Tympanic  PainSc: 4          Complications: No apparent anesthesia complications

## 2017-08-05 NOTE — Anesthesia Postprocedure Evaluation (Signed)
Anesthesia Post Note  Patient: Kristin [name]  Procedure(s) Performed: TENNIS ELBOW RELEASE/NIRSCHEL PROCEDURE (Left Elbow)  Patient location during evaluation: PACU Anesthesia Type: General Level of consciousness: awake and alert Pain management: pain level controlled Vital Signs Assessment: post-procedure vital signs reviewed and stable Respiratory status: spontaneous breathing, nonlabored ventilation, respiratory function stable and patient connected to nasal cannula oxygen Cardiovascular status: blood pressure returned to baseline and stable Postop Assessment: no apparent nausea or vomiting Anesthetic complications: no     Last Vitals:  Vitals:   08/05/17 0958 08/05/17 1002  BP:    Pulse: 60 70  Resp: 13 15  Temp:  36.6 C  SpO2: 99% 100%    Last Pain:  Vitals:   08/05/17 0957  TempSrc:   PainSc: 4                  Precious Haws Nahal Wanless

## 2017-08-05 NOTE — H&P (Signed)
  H and P reviewed. No changes. Uploaded at later date. 

## 2017-08-05 NOTE — Op Note (Signed)
Expand All Collapse All       PATIENT: Kristin [name], Kristin [name].  PRE-OPERATIVE DIAGNOSIS: LATERAL EPICONDYLITIS LEFT ELBOW M77.11  POST-OPERATIVE DIAGNOSIS: LATERAL EPICONDYLITIS left ELBOW.  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  The patient had conservative treatment in the form of lateral epicondylar steroid injections.  These had not completely relieved the patient's symptoms. Consequently the patient was brought in for a Nirschl procedure on the left elbow.  OP NOTE: The patient was taken to the operating room where a tourniquet was applied to her left upper arm. Satisfactory general anesthesia was achieved. The left upper extremity was prepped and draped in usual fashion for procedure about the elbow.  The left upper extremity was then exsanguinated and the tourniquet was inflated. A longitudinal incision was made over the lateral epicondyle and the radial head.The interval between the extensor carpi radialis longus and the common extensor was opened up. I reflected their tendinous attachments off the lateral epicondyle. I then excised the scarred lateral epicondylar attachment of the extensor carpi radialis brevis in a V-shaped fashion. The radiohumeral joint was inspected. There was no significant synovial fluid in the joint. No significant chondral lesion was appreciated. Next I used a 3 mm burr to lightly decorticate the lateral epicondyle. I then drilled multiple 2 mm holes in the lateral epicondyle. The wound was irrigated with saline. I then closed the interval between the extensor carpi radialis longus and the common extensor with 2-0 Vicryl sutures. The tourniquet was released. It was up about 19 minutes. Bleeding was controlled with coagulation cautery. I  infiltrated the subcutaneous tissue with 10 cc of half percent Marcaine without epinephrine. The subcutaneous tissue was then closed with 2-0 Vicryl and skin with skin staples. Betadine was applied the wound followed by a sterile dressing and a fiberglass posterior splint with the elbow flexed about 90. A sling was then applied to the left upper extremity.  The patient was then awakened and transferred to a stretcher bed.  The patient was then taken to the recovery room in satisfactory condition. Blood loss was negligible.

## 2017-08-05 NOTE — Discharge Instructions (Addendum)
Ice pack   Elevation  RTC in about 2 week  Sling  AMBULATORY SURGERY  DISCHARGE INSTRUCTIONS   1) The drugs that you were given will stay in your system until tomorrow so for the next 24 hours you should not:  A) Drive an automobile B) Make any legal decisions C) Drink any alcoholic beverage   2) You may resume regular meals tomorrow.  Today it is better to start with liquids and gradually work up to solid foods.  You may eat anything you prefer, but it is better to start with liquids, then soup and crackers, and gradually work up to solid foods.   3) Please notify your doctor immediately if you have any unusual bleeding, trouble breathing, redness and pain at the surgery site, drainage, fever, or pain not relieved by medication.    4) Additional Instructions: TAKE A STOOL SOFTENER TWICE A DAY WHILE TAKING NARCOTIC PAIN MEDICINE TO PREVENT CONSTIPATION   Please contact your physician with any problems or Same Day Surgery at [phone], Monday through Friday 6 am to 4 pm, or  at [facility] number at [phone].

## 2017-08-28 ENCOUNTER — Other Ambulatory Visit: Payer: Self-pay | Admitting: Obstetrics and Gynecology

## 2017-09-16 ENCOUNTER — Other Ambulatory Visit: Payer: Self-pay | Admitting: Internal Medicine

## 2017-09-16 DIAGNOSIS — G4700 Insomnia, unspecified: Secondary | ICD-10-CM

## 2017-09-16 MED ORDER — ZOLPIDEM TARTRATE ER 12.5 MG PO TBCR: 12.5000 mg | EXTENDED_RELEASE_TABLET | Freq: Every evening | ORAL | 2 refills | Status: DC | PRN

## 2017-10-06 ENCOUNTER — Encounter: Payer: Self-pay | Admitting: Internal Medicine

## 2017-10-06 ENCOUNTER — Ambulatory Visit (INDEPENDENT_AMBULATORY_CARE_PROVIDER_SITE_OTHER): Payer: No Typology Code available for payment source | Admitting: Internal Medicine

## 2017-10-06 VITALS — BP 156/90 | HR 74 | Temp 98.2°F | Ht 63.0 in | Wt 180.8 lb

## 2017-10-06 DIAGNOSIS — R42 Dizziness and giddiness: Secondary | ICD-10-CM | POA: Diagnosis not present

## 2017-10-06 DIAGNOSIS — H9319 Tinnitus, unspecified ear: Secondary | ICD-10-CM

## 2017-10-06 DIAGNOSIS — H6980 Other specified disorders of Eustachian tube, unspecified ear: Secondary | ICD-10-CM

## 2017-10-06 DIAGNOSIS — M62838 Other muscle spasm: Secondary | ICD-10-CM

## 2017-10-06 DIAGNOSIS — Z1159 Encounter for screening for other viral diseases: Secondary | ICD-10-CM

## 2017-10-06 DIAGNOSIS — R7303 Prediabetes: Secondary | ICD-10-CM

## 2017-10-06 DIAGNOSIS — I10 Essential (primary) hypertension: Secondary | ICD-10-CM

## 2017-10-06 DIAGNOSIS — Z23 Encounter for immunization: Secondary | ICD-10-CM

## 2017-10-06 DIAGNOSIS — G4700 Insomnia, unspecified: Secondary | ICD-10-CM

## 2017-10-06 DIAGNOSIS — R51 Headache: Secondary | ICD-10-CM | POA: Diagnosis not present

## 2017-10-06 DIAGNOSIS — Z0184 Encounter for antibody response examination: Secondary | ICD-10-CM

## 2017-10-06 DIAGNOSIS — F419 Anxiety disorder, unspecified: Secondary | ICD-10-CM | POA: Diagnosis not present

## 2017-10-06 DIAGNOSIS — M47812 Spondylosis without myelopathy or radiculopathy, cervical region: Secondary | ICD-10-CM

## 2017-10-06 DIAGNOSIS — R519 Headache, unspecified: Secondary | ICD-10-CM

## 2017-10-06 DIAGNOSIS — G8929 Other chronic pain: Secondary | ICD-10-CM

## 2017-10-06 LAB — SEDIMENTATION RATE: Sed Rate: 40 mm/hr — ABNORMAL HIGH (ref 0–30)

## 2017-10-06 LAB — C-REACTIVE PROTEIN: CRP: 0.4 mg/dL — ABNORMAL LOW (ref 0.5–20.0)

## 2017-10-06 LAB — HEMOGLOBIN A1C: Hgb A1c MFr Bld: 6.4 % (ref 4.6–6.5)

## 2017-10-06 MED ORDER — DIAZEPAM 5 MG PO TABS: 5.0000 mg | ORAL_TABLET | Freq: Once | ORAL | 0 refills | Status: DC

## 2017-10-06 MED ORDER — NORTRIPTYLINE HCL 10 MG PO CAPS: 10.0000 mg | ORAL_CAPSULE | Freq: Every day | ORAL | 0 refills | Status: DC

## 2017-10-06 MED ORDER — ZOLPIDEM TARTRATE ER 12.5 MG PO TBCR: 12.5000 mg | EXTENDED_RELEASE_TABLET | Freq: Every evening | ORAL | 5 refills | Status: DC | PRN

## 2017-10-06 MED ORDER — TIZANIDINE HCL 2 MG PO CAPS: 2.0000 mg | ORAL_CAPSULE | Freq: Every evening | ORAL | 0 refills | Status: DC | PRN

## 2017-10-06 NOTE — Progress Notes (Signed)
Pre visit review using our clinic review tool, if applicable. No additional management support is needed unless otherwise documented below in the visit note. 

## 2017-10-06 NOTE — Progress Notes (Signed)
Chief Complaint  Patient presents with  . Follow-up   F/u  1. C/o h/a crown of head since labor day qd 5/10 max tried Tylenol unable to take Excedrine topamax she was on in the past for migraines but does not want to take any longer due to c/w side effects long term. Declines she snores or stops breathing. H/a does not feel like typical migraines in the past which she was seen by neurology and this migraine was left sided only in the past. She also c/o associated dizziness with standing at times, ringing in the ears, but no hearing loss. Denies n/v/photophobia which would be typical of previous migraines. Reviewed MRI C spine and MRI brain 01/18/2016 with + moderate arthritis C spine but negative etiology in brain even negative sinus etiology. She is now retired so stress is not an issue.  2. HTN uncontrolled here today per pt she takes her BP at home getting 119/65, 122/66 taking norvasc 2.5 mg qhs prior dose 5 mg she reports made her feel dizzy 3. Insomnia better with Ambien 12.5 mg Cr would like refill  4. S/p left elbow surgery 07/2017 for epicondylitis with Dr. Leanor Kail doing better    Review of Systems  Constitutional: Negative for weight loss.  HENT: Positive for tinnitus. Negative for hearing loss.   Eyes: Negative for blurred vision and photophobia.  Respiratory: Negative for shortness of breath.   Cardiovascular: Negative for chest pain.  Gastrointestinal: Negative for nausea and vomiting.  Neurological: Positive for dizziness and headaches.  Psychiatric/Behavioral: Negative for depression. The patient does not have insomnia.    Past Medical History:  Diagnosis Date  . Allergy   . Arthritis   . Basal cell carcinoma    face, back, neck, arm  . Basal cell carcinoma    nose, left cheek  . Chicken pox   . Complication of anesthesia    blood pressure elevated after gastric sleeve surgery in recovery, pt does not want mask over face  . Depression   . Diabetes mellitus  without complication ([place])    pre-diabetic currently, was diabetic prior to gastric sleeve surgery   . Fatty liver   . GERD (gastroesophageal reflux disease)   . Gout   . History of shingles   . Hyperlipidemia   . Hypertension    no problems since losing weight after gastric sleeve surgery  . Insomnia   . Migraine   . Obesity   . Osteopenia    Past Surgical History:  Procedure Laterality Date  . ABDOMINAL HYSTERECTOMY    . ANKLE ARTHROSCOPY Left 05/29/2016   Procedure: ANKLE ARTHROSCOPY;  Surgeon: [name], DPM;  Location: [place];  Service: Podiatry;  Laterality: Left;  . APPENDECTOMY    . basal cell removed    . CESAREAN SECTION N/A    2  . CHOLECYSTECTOMY    . cyst removed from wrist    . FOOT SURGERY     left foot 11/2016 Dr. Laymond Purser   . MOHS SURGERY     x 2 face nose and left cheek   . OSTEOCHONDROMA EXCISION Left 05/29/2016   Procedure: OSTEOCHONDRAL DRILLING TALUS;  Surgeon: [name], DPM;  Location: [place];  Service: Podiatry;  Laterality: Left;  . PLANTAR FASCIA RELEASE Left 05/29/2016   Procedure: ENDOSCOPIC PLANTAR FASCIOTOMY;  Surgeon: [name], DPM;  Location: [facility];  Service: Podiatry;  Laterality: Left;  . ROTATOR CUFF REPAIR     lt shoulder  .  STOMACH SURGERY     gastric sleeve surgery  . TENNIS ELBOW RELEASE/NIRSCHEL PROCEDURE Left 08/05/2017   Procedure: TENNIS ELBOW RELEASE/NIRSCHEL PROCEDURE;  Surgeon: Leanor Kail, MD;  Location: [facility];  Service: Orthopedics;  Laterality: Left;  . TUBAL LIGATION    . wedge resection of ovary  1982   Family History  Problem Relation Age of Onset  . Heart disease Mother   . Breast cancer Mother 31  . Arthritis Mother   . Hyperlipidemia Mother   . Heart disease Father   . Lung cancer Father   . Hyperlipidemia Father   . Hypertension Father   . Diabetes Sister        x2  . Diabetes Maternal Aunt   . Diabetes Maternal Uncle   . Diabetes Maternal Grandfather   . Hyperlipidemia Maternal  Grandfather   . Arthritis Maternal Grandmother   . Sudden death Cousin   . Diabetes Maternal Aunt   . Colon cancer Neg Hx   . Ovarian cancer Neg Hx    Social History   Socioeconomic History  . Marital status: Married    Spouse name: Not on file  . Number of children: Not on file  . Years of education: Not on file  . Highest education level: Not on file  Occupational History  . Not on file  Social Needs  . Financial resource strain: Not on file  . Food insecurity:    Worry: Not on file    Inability: Not on file  . Transportation needs:    Medical: Not on file    Non-medical: Not on file  Tobacco Use  . Smoking status: Never Smoker  . Smokeless tobacco: Never Used  Substance and Sexual Activity  . Alcohol use: No  . Drug use: No  . Sexual activity: Yes    Partners: Male    Birth control/protection: Surgical  Lifestyle  . Physical activity:    Days per week: 0 days    Minutes per session: 0 min  . Stress: Not on file  Relationships  . Social connections:    Talks on phone: Not on file    Gets together: Not on file    Attends religious service: Not on file    Active member of club or organization: Not on file    Attends meetings of clubs or organizations: Not on file    Relationship status: Not on file  . Intimate partner violence:    Fear of current or ex partner: Not on file    Emotionally abused: Not on file    Physically abused: Not on file    Forced sexual activity: Not on file  Other Topics Concern  . Not on file  Social History Narrative   Married x 37 years as of 01/2017    Kids    Write childrens books    Retiring from [place] [employer] 01/2017    Enjoys traveling    Current Meds  Medication Sig  . amLODipine (NORVASC) 2.5 MG tablet Take 1 tablet (2.5 mg total) by mouth daily. No need to cut pill in 1/2 take whole pill (Patient taking differently: Take 2.5 mg by mouth every evening. )  . Cholecalciferol (VITAMIN D) 2000 units tablet Take 2,000 Units by mouth  daily.  Marland Kitchen estradiol (ESTRACE) 1 MG tablet TAKE 1 TABLET BY MOUTH EVERY DAY  . loratadine (CLARITIN) 10 MG tablet Take 10 mg by mouth daily as needed for allergies.  Marland Kitchen meclizine (ANTIVERT) 12.5 MG tablet Take 1  tablet (12.5 mg total) by mouth 2 (two) times daily as needed for dizziness.  . Melatonin 10 MG TABS Take 10 mg by mouth at bedtime as needed (sleep).  . meloxicam (MOBIC) 15 MG tablet Take 1 tablet (15 mg total) by mouth daily. (Patient taking differently: Take 15 mg by mouth every evening. )  . Multiple Vitamin (MULTIVITAMIN WITH MINERALS) TABS tablet Take 1 tablet by mouth daily.  Marland Kitchen omeprazole (PRILOSEC) 20 MG capsule Take 20 mg by mouth daily as needed (indigestion).  . rosuvastatin (CRESTOR) 20 MG tablet Take 1 tablet (20 mg total) by mouth at bedtime.  Marland Kitchen zolpidem (AMBIEN CR) 12.5 MG CR tablet Take 1 tablet (12.5 mg total) by mouth at bedtime as needed.   Allergies  Allergen Reactions  . Tetracyclines & Related Swelling and Rash    FACE SWELL, RASH ON CHEST  . Alcohol Rash    ABDOMINAL CRAMPS, drinking alcohol  . Metoprolol Diarrhea   Recent Results (from the past 2160 hour(s))  Basic metabolic panel     Status: Abnormal   Collection Time: 08/05/17  6:36 AM  Result Value Ref Range   Sodium 139 135 - 145 mmol/L   Potassium 3.5 3.5 - 5.1 mmol/L   Chloride 107 98 - 111 mmol/L    Comment: Please note change in reference range.   CO2 26 22 - 32 mmol/L   Glucose, Bld 99 70 - 99 mg/dL    Comment: Please note change in reference range.   BUN 16 6 - 20 mg/dL    Comment: Please note change in reference range.   Creatinine, Ser 0.64 0.44 - 1.00 mg/dL   Calcium 8.4 (L) 8.9 - 10.3 mg/dL   GFR calc non Af Amer >60 >60 mL/min   GFR calc Af Amer >60 >60 mL/min    Comment: (NOTE) The eGFR has been calculated using the CKD EPI equation. This calculation has not been validated in all clinical situations. eGFR's persistently <60 mL/min signify possible Chronic Kidney Disease.     Anion gap 6 5 - 15    Comment: Performed at [facility], [address] [place], [place] [address]  CBC     Status: None   Collection Time: 08/05/17  6:36 AM  Result Value Ref Range   WBC 8.6 3.6 - 11.0 K/uL   RBC 4.59 3.80 - 5.20 MIL/uL   Hemoglobin 13.6 12.0 - 16.0 g/dL   HCT 40.1 35.0 - 47.0 %   MCV 87.4 80.0 - 100.0 fL   MCH 29.6 26.0 - 34.0 pg   MCHC 33.9 32.0 - 36.0 g/dL   RDW 13.8 11.5 - 14.5 %   Platelets 293 150 - 440 K/uL    Comment: Performed at [facility], [address] [place], [place] [address]   Objective  Body mass index is 32.03 kg/m. Wt Readings from Last 3 Encounters:  10/06/17 180 lb 12.8 oz (82 kg)  08/05/17 176 lb (79.8 kg)  07/30/17 176 lb (79.8 kg)   Temp Readings from Last 3 Encounters:  10/06/17 98.2 F (36.8 C) (Oral)  08/05/17 (!) 96.9 F (36.1 C) (Temporal)  05/06/17 98.2 F (36.8 C) (Oral)   BP Readings from Last 3 Encounters:  10/06/17 (!) 152/70  08/05/17 119/64  07/30/17 140/69   Pulse Readings from Last 3 Encounters:  10/06/17 74  08/05/17 74  07/30/17 71    Physical Exam  Constitutional: She is oriented to person, place, and time. Vital signs are normal.  She appears well-developed and well-nourished. She is cooperative.  HENT:  Head: Normocephalic and atraumatic.  Mouth/Throat: Oropharynx is clear and moist and mucous membranes are normal.  Mild to mod fluid b/l ears   Eyes: Pupils are equal, round, and reactive to light. Conjunctivae are normal.  Cardiovascular: Normal rate, regular rhythm and normal heart sounds.  Pulmonary/Chest: Effort normal and breath sounds normal.  Neurological: She is alert and oriented to person, place, and time. Gait normal.  CN 2-12 grossly intact  Normal motor strength upper and lower ext b/l  No sensory deficits   Skin: Skin is warm, dry and intact.  Psychiatric: She has a normal mood and affect. Her speech is normal and behavior is normal. Judgment and thought content  normal. Cognition and memory are normal.  Nursing note and vitals reviewed.   Assessment   1. H/a assoc. With tinnitis and dizziness ddx occipital neuralgia 2/2 arthritis + MRI (see HPI), migraines, uncontrolled BP, temporal arteritis though less likely on differential. She also is on chronic oral estrogen so consider cavernous sinus thrombosis 2. HTN uncontrolled  3. Insomnia  4. Eustachian tube dysfunction likely related to allergies  5. HM Plan   1. Trial of zanaflex low dose due to use with Ambien prn qhs  Trial of nortriptyline 10 mg qhs  Monitor BP and let me know in 2 weeks how it is via mychart after log kept at home  Hold MRI/V for now as pt had MRI brain 01/18/16 and w/u negative  -if imaging needed in the future will need anxiolytic  Check CRP and ESR today Consider neurology consult in future h/a and wellness center saw in the past  Prn  Meclizine for dizziness prn Tylenol for pain  2. Take norvasc 2.5 mg in the am instead of pm  If bp still not controlled will rec hctz 12.5 to add to it  3. Refilled meds  4. Unable to tolerate nose sprays on claritin 10 mg  Also disc singulair though this less likely to help ear sx's more than nasal sprays  5.  Had flu today  utd Tdap  Disc shingrixprev hep A vaccine 1/2 2nd dose due in 11/05/17-05/07/2018, hep B immune with h/o fatty liver  Hep C neg MMR check today  A1C check 6.4 today   Never smoker, chew congratulatednot doing either mammo neg 05/11/17  Colonoscopy -obtained records 07/15/11 colonoscopy Dr. Candace Cruise nl repeat in 10 years  Pap s/p hysterectomy ? Cervix intact has appt Dr. Enzo Bi 02/10/17. -Appt went well 01/2017 ob/gyn f/u in 1 year  -per note pap not needed  Dermatology sees [place] [facility] q6 months h/o BCC nose and left cheek s/p Mohs and h/o Aks  Of not she saw [facility] eye late March 2019 and had eye glasses checked    Provider: Dr. Olivia Mackie McLean-Scocuzza-Internal [facility]

## 2017-10-06 NOTE — Patient Instructions (Addendum)
Dramamine or [place] used to fly with chewable  My chart me in 2 weeks to let me know how you are doing  -log blood pressure for 1-2 weeks my chart me your blood pressure readings <140/<90  F/u in 2-3 months  Take care  Take norvasc 2.5 in am    Hypertension Hypertension, commonly called high blood pressure, is when the force of blood pumping through the arteries is too strong. The arteries are the blood vessels that carry blood from the heart throughout the body. Hypertension forces the heart to work harder to pump blood and may cause arteries to become narrow or stiff. Having untreated or uncontrolled hypertension can cause heart attacks, strokes, kidney disease, and other problems. A blood pressure reading consists of a higher number over a lower number. Ideally, your blood pressure should be below 120/80. The first ("top") number is called the systolic pressure. It is a measure of the pressure in your arteries as your heart beats. The second ("bottom") number is called the diastolic pressure. It is a measure of the pressure in your arteries as the heart relaxes. What are the causes? The cause of this condition is not known. What increases the risk? Some risk factors for high blood pressure are under your control. Others are not. Factors you can change  Smoking.  Having type 2 diabetes mellitus, high cholesterol, or both.  Not getting enough exercise or physical activity.  Being overweight.  Having too much fat, sugar, calories, or salt (sodium) in your diet.  Drinking too much alcohol. Factors that are difficult or impossible to change  Having chronic kidney disease.  Having a family history of high blood pressure.  Age. Risk increases with age.  Race. You may be at higher risk if you are African-American.  Gender. Men are at higher risk than women before age 74. After age 76, women are at higher risk than men.  Having obstructive sleep apnea.  Stress. What are the  signs or symptoms? Extremely high blood pressure (hypertensive crisis) may cause:  Headache.  Anxiety.  Shortness of breath.  Nosebleed.  Nausea and vomiting.  Severe chest pain.  Jerky movements you cannot control (seizures).  How is this diagnosed? This condition is diagnosed by measuring your blood pressure while you are seated, with your arm resting on a surface. The cuff of the blood pressure monitor will be placed directly against the skin of your upper arm at the level of your heart. It should be measured at least twice using the same arm. Certain conditions can cause a difference in blood pressure between your right and left arms. Certain factors can cause blood pressure readings to be lower or higher than normal (elevated) for a short period of time:  When your blood pressure is higher when you are in a health care provider's office than when you are at home, this is called white coat hypertension. Most people with this condition do not need medicines.  When your blood pressure is higher at home than when you are in a health care provider's office, this is called masked hypertension. Most people with this condition may need medicines to control blood pressure.  If you have a high blood pressure reading during one visit or you have normal blood pressure with other risk factors:  You may be asked to return on a different day to have your blood pressure checked again.  You may be asked to monitor your blood pressure at home for 1 week or  longer.  If you are diagnosed with hypertension, you may have other blood or imaging tests to help your health care provider understand your overall risk for other conditions. How is this treated? This condition is treated by making healthy lifestyle changes, such as eating healthy foods, exercising more, and reducing your alcohol intake. Your health care provider may prescribe medicine if lifestyle changes are not enough to get your blood  pressure under control, and if:  Your systolic blood pressure is above 130.  Your diastolic blood pressure is above 80.  Your personal target blood pressure may vary depending on your medical conditions, your age, and other factors. Follow these instructions at home: Eating and drinking  Eat a diet that is high in fiber and potassium, and low in sodium, added sugar, and fat. An example eating plan is called the DASH (Dietary Approaches to Stop Hypertension) diet. To eat this way: ? Eat plenty of fresh fruits and vegetables. Try to fill half of your plate at each meal with fruits and vegetables. ? Eat whole grains, such as whole wheat pasta, brown rice, or whole grain bread. Fill about one quarter of your plate with whole grains. ? Eat or drink low-fat dairy products, such as skim milk or low-fat yogurt. ? Avoid fatty cuts of meat, processed or cured meats, and poultry with skin. Fill about one quarter of your plate with lean proteins, such as fish, chicken without skin, beans, eggs, and tofu. ? Avoid premade and processed foods. These tend to be higher in sodium, added sugar, and fat.  Reduce your daily sodium intake. Most people with hypertension should eat less than 1,500 mg of sodium a day.  Limit alcohol intake to no more than 1 drink a day for nonpregnant women and 2 drinks a day for men. One drink equals 12 oz of beer, 5 oz of wine, or 1 oz of hard liquor. Lifestyle  Work with your health care provider to maintain a healthy body weight or to lose weight. Ask what an ideal weight is for you.  Get at least 30 minutes of exercise that causes your heart to beat faster (aerobic exercise) most days of the week. Activities may include walking, swimming, or biking.  Include exercise to strengthen your muscles (resistance exercise), such as pilates or lifting weights, as part of your weekly exercise routine. Try to do these types of exercises for 30 minutes at least 3 days a week.  Do not  use any products that contain nicotine or tobacco, such as cigarettes and e-cigarettes. If you need help quitting, ask your health care provider.  Monitor your blood pressure at home as told by your health care provider.  Keep all follow-up visits as told by your health care provider. This is important. Medicines  Take over-the-counter and prescription medicines only as told by your health care provider. Follow directions carefully. Blood pressure medicines must be taken as prescribed.  Do not skip doses of blood pressure medicine. Doing this puts you at risk for problems and can make the medicine less effective.  Ask your health care provider about side effects or reactions to medicines that you should watch for. Contact a health care provider if:  You think you are having a reaction to a medicine you are taking.  You have headaches that keep coming back (recurring).  You feel dizzy.  You have swelling in your ankles.  You have trouble with your vision. Get help right away if:  You develop a  severe headache or confusion.  You have unusual weakness or numbness.  You feel faint.  You have severe pain in your chest or abdomen.  You vomit repeatedly.  You have trouble breathing. Summary  Hypertension is when the force of blood pumping through your arteries is too strong. If this condition is not controlled, it may put you at risk for serious complications.  Your personal target blood pressure may vary depending on your medical conditions, your age, and other factors. For most people, a normal blood pressure is less than 120/80.  Hypertension is treated with lifestyle changes, medicines, or a combination of both. Lifestyle changes include weight loss, eating a healthy, low-sodium diet, exercising more, and limiting alcohol. This information is not intended to replace advice given to you by your health care provider. Make sure you discuss any questions you have with your health  care provider. Document Released: 01/06/2005 Document Revised: 12/05/2015 Document Reviewed: 12/05/2015 Elsevier Interactive Patient Education  2018 [employer].   Nortriptyline capsules What is this medicine? NORTRIPTYLINE (nor TRIP ti leen) is used to treat depression. This medicine may be used for other purposes; ask your health care provider or pharmacist if you have questions. COMMON BRAND NAME(S): Aventyl, Pamelor What should I tell my health care provider before I take this medicine? They need to know if you have any of these conditions: -an alcohol problem -bipolar disorder or schizophrenia -difficulty passing urine, prostate trouble -glaucoma -heart disease or recent heart attack -liver disease -over active thyroid -seizures -thoughts or plans of suicide or a previous suicide attempt or family history of suicide attempt -an unusual or allergic reaction to nortriptyline, other medicines, foods, dyes, or preservatives -pregnant or trying to get pregnant -breast-feeding How should I use this medicine? Take this medicine by mouth with a glass of water. Follow the directions on the prescription label. Take your doses at regular intervals. Do not take it more often than directed. Do not stop taking this medicine suddenly except upon the advice of your doctor. Stopping this medicine too quickly may cause serious side effects or your condition may worsen. A special MedGuide will be given to you by the pharmacist with each prescription and refill. Be sure to read this information carefully each time. Talk to your pediatrician regarding the use of this medicine in children. Special care may be needed. Overdosage: If you think you have taken too much of this medicine contact a poison control center or emergency room at once. NOTE: This medicine is only for you. Do not share this medicine with others. What if I miss a dose? If you miss a dose, take it as soon as you can. If it is almost  time for your next dose, take only that dose. Do not take double or extra doses. What may interact with this medicine? Do not take this medicine with any of the following medications: -arsenic trioxide -certain medicines medicines for irregular heart beat -cisapride -halofantrine -linezolid -MAOIs like Carbex, Eldepryl, Marplan, Nardil, and Parnate -methylene blue (injected into a vein) -other medicines for mental depression -phenothiazines like perphenazine, thioridazine and chlorpromazine -pimozide -probucol -procarbazine -sparfloxacin -St. John's Wort -ziprasidone This medicine may also interact with any of the following medications: -atropine and related drugs like hyoscyamine, scopolamine, tolterodine and others -barbiturate medicines for inducing sleep or treating seizures, such as phenobarbital -cimetidine -medicines for diabetes -medicines for seizures like carbamazepine or phenytoin -reserpine -thyroid medicine This list may not describe all possible interactions. Give your health care provider  a list of all the medicines, herbs, non-prescription drugs, or dietary supplements you use. Also tell them if you smoke, drink alcohol, or use illegal drugs. Some items may interact with your medicine. What should I watch for while using this medicine? Tell your doctor if your symptoms do not get better or if they get worse. Visit your doctor or health care professional for regular checks on your progress. Because it may take several weeks to see the full effects of this medicine, it is important to continue your treatment as prescribed by your doctor. Patients and their families should watch out for new or worsening thoughts of suicide or depression. Also watch out for sudden changes in feelings such as feeling anxious, agitated, panicky, irritable, hostile, aggressive, impulsive, severely restless, overly excited and hyperactive, or not being able to sleep. If this happens, especially  at the beginning of treatment or after a change in dose, call your health care professional. Dennis Bast may get drowsy or dizzy. Do not drive, use machinery, or do anything that needs mental alertness until you know how this medicine affects you. Do not stand or sit up quickly, especially if you are an older patient. This reduces the risk of dizzy or fainting spells. Alcohol may interfere with the effect of this medicine. Avoid alcoholic drinks. Do not treat yourself for coughs, colds, or allergies without asking your doctor or health care professional for advice. Some ingredients can increase possible side effects. Your mouth may get dry. Chewing sugarless gum or sucking hard candy, and drinking plenty of water may help. Contact your doctor if the problem does not go away or is severe. This medicine may cause dry eyes and blurred vision. If you wear contact lenses you may feel some discomfort. Lubricating drops may help. See your eye doctor if the problem does not go away or is severe. This medicine can cause constipation. Try to have a bowel movement at least every 2 to 3 days. If you do not have a bowel movement for 3 days, call your doctor or health care professional. This medicine can make you more sensitive to the sun. Keep out of the sun. If you cannot avoid being in the sun, wear protective clothing and use sunscreen. Do not use sun lamps or tanning beds/booths. What side effects may I notice from receiving this medicine? Side effects that you should report to your doctor or health care professional as soon as possible: -allergic reactions like skin rash, itching or hives, swelling of the face, lips, or tongue -anxious -breathing problems -changes in vision -confusion -elevated mood, decreased need for sleep, racing thoughts, impulsive behavior -eye pain -fast, irregular heartbeat -feeling faint or lightheaded, falls -feeling agitated, angry, or irritable -fever with increased  sweating -hallucination, loss of contact with reality -seizures -stiff muscles -suicidal thoughts or other mood changes -tingling, pain, or numbness in the feet or hands -trouble passing urine or change in the amount of urine -trouble sleeping -unusually weak or tired -vomiting -yellowing of the eyes or skin Side effects that usually do not require medical attention (report to your doctor or health care professional if they continue or are bothersome): -change in sex drive or performance -change in appetite or weight -constipation -dizziness -dry mouth -nausea -tired -tremors -upset stomach This list may not describe all possible side effects. Call your doctor for medical advice about side effects. You may report side effects to FDA at 1-800-FDA-1088. Where should I keep my medicine? Keep out of the reach of  children. Store at room temperature between 15 and 30 degrees C (59 and 86 degrees F). Keep container tightly closed. Throw away any unused medicine after the expiration date. NOTE: This sheet is a summary. It may not cover all possible information. If you have questions about this medicine, talk to your doctor, pharmacist, or health care provider.  2018 Elsevier/Gold Standard (2015-06-08 12:53:08)    Migraine Headache A migraine headache is an intense, throbbing pain on one side or both sides of the head. Migraines may also cause other symptoms, such as nausea, vomiting, and sensitivity to light and noise. What are the causes? Doing or taking certain things may also trigger migraines, such as:  Alcohol.  Smoking.  Medicines, such as: ? Medicine used to treat chest pain (nitroglycerine). ? Birth control pills. ? Estrogen pills. ? Certain blood pressure medicines.  Aged cheeses, chocolate, or caffeine.  Foods or drinks that contain nitrates, glutamate, aspartame, or tyramine.  Physical activity.  Other things that may trigger a migraine  include:  Menstruation.  Pregnancy.  Hunger.  Stress, lack of sleep, too much sleep, or fatigue.  Weather changes.  What increases the risk? The following factors may make you more likely to experience migraine headaches:  Age. Risk increases with age.  Family history of migraine headaches.  Being Caucasian.  Depression and anxiety.  Obesity.  Being a woman.  Having a hole in the heart (patent foramen ovale) or other heart problems.  What are the signs or symptoms? The main symptom of this condition is pulsating or throbbing pain. Pain may:  Happen in any area of the head, such as on one side or both sides.  Interfere with daily activities.  Get worse with physical activity.  Get worse with exposure to bright lights or loud noises.  Other symptoms may include:  Nausea.  Vomiting.  Dizziness.  General sensitivity to bright lights, loud noises, or smells.  Before you get a migraine, you may get warning signs that a migraine is developing (aura). An aura may include:  Seeing flashing lights or having blind spots.  Seeing bright spots, halos, or zigzag lines.  Having tunnel vision or blurred vision.  Having numbness or a tingling feeling.  Having trouble talking.  Having muscle weakness.  How is this diagnosed? A migraine headache can be diagnosed based on:  Your symptoms.  A physical exam.  Tests, such as CT scan or MRI of the head. These imaging tests can help rule out other causes of headaches.  Taking fluid from the spine (lumbar puncture) and analyzing it (cerebrospinal fluid analysis, or CSF analysis).  How is this treated? A migraine headache is usually treated with medicines that:  Relieve pain.  Relieve nausea.  Prevent migraines from coming back.  Treatment may also include:  Acupuncture.  Lifestyle changes like avoiding foods that trigger migraines.  Follow these instructions at home: Medicines  Take over-the-counter  and prescription medicines only as told by your health care provider.  Do not drive or use heavy machinery while taking prescription pain medicine.  To prevent or treat constipation while you are taking prescription pain medicine, your health care provider may recommend that you: ? Drink enough fluid to keep your urine clear or pale yellow. ? Take over-the-counter or prescription medicines. ? Eat foods that are high in fiber, such as fresh fruits and vegetables, whole grains, and beans. ? Limit foods that are high in fat and processed sugars, such as fried and sweet foods. Lifestyle  Avoid alcohol use.  Do not use any products that contain nicotine or tobacco, such as cigarettes and e-cigarettes. If you need help quitting, ask your health care provider.  Get at least 8 hours of sleep every night.  Limit your stress. General instructions   Keep a journal to find out what may trigger your migraine headaches. For example, write down: ? What you eat and drink. ? How much sleep you get. ? Any change to your diet or medicines.  If you have a migraine: ? Avoid things that make your symptoms worse, such as bright lights. ? It may help to lie down in a dark, quiet room. ? Do not drive or use heavy machinery. ? Ask your health care provider what activities are safe for you while you are experiencing symptoms.  Keep all follow-up visits as told by your health care provider. This is important. Contact a health care provider if:  You develop symptoms that are different or more severe than your usual migraine symptoms. Get help right away if:  Your migraine becomes severe.  You have a fever.  You have a stiff neck.  You have vision loss.  Your muscles feel weak or like you cannot control them.  You start to lose your balance often.  You develop trouble walking.  You faint. This information is not intended to replace advice given to you by your health care provider. Make sure  you discuss any questions you have with your health care provider. Document Released: 01/06/2005 Document Revised: 07/27/2015 Document Reviewed: 06/25/2015 Elsevier Interactive Patient Education  2017 [employer]   Hypertension Hypertension, commonly called high blood pressure, is when the force of blood pumping through the arteries is too strong. The arteries are the blood vessels that carry blood from the heart throughout the body. Hypertension forces the heart to work harder to pump blood and may cause arteries to become narrow or stiff. Having untreated or uncontrolled hypertension can cause heart attacks, strokes, kidney disease, and other problems. A blood pressure reading consists of a higher number over a lower number. Ideally, your blood pressure should be below 120/80. The first ("top") number is called the systolic pressure. It is a measure of the pressure in your arteries as your heart beats. The second ("bottom") number is called the diastolic pressure. It is a measure of the pressure in your arteries as the heart relaxes. What are the causes? The cause of this condition is not known. What increases the risk? Some risk factors for high blood pressure are under your control. Others are not. Factors you can change  Smoking.  Having type 2 diabetes mellitus, high cholesterol, or both.  Not getting enough exercise or physical activity.  Being overweight.  Having too much fat, sugar, calories, or salt (sodium) in your diet.  Drinking too much alcohol. Factors that are difficult or impossible to change  Having chronic kidney disease.  Having a family history of high blood pressure.  Age. Risk increases with age.  Race. You may be at higher risk if you are African-American.  Gender. Men are at higher risk than women before age 56. After age 68, women are at higher risk than men.  Having obstructive sleep apnea.  Stress. What are the signs or symptoms? Extremely high  blood pressure (hypertensive crisis) may cause:  Headache.  Anxiety.  Shortness of breath.  Nosebleed.  Nausea and vomiting.  Severe chest pain.  Jerky movements you cannot control (seizures).  How is this  diagnosed? This condition is diagnosed by measuring your blood pressure while you are seated, with your arm resting on a surface. The cuff of the blood pressure monitor will be placed directly against the skin of your upper arm at the level of your heart. It should be measured at least twice using the same arm. Certain conditions can cause a difference in blood pressure between your right and left arms. Certain factors can cause blood pressure readings to be lower or higher than normal (elevated) for a short period of time:  When your blood pressure is higher when you are in a health care provider's office than when you are at home, this is called white coat hypertension. Most people with this condition do not need medicines.  When your blood pressure is higher at home than when you are in a health care provider's office, this is called masked hypertension. Most people with this condition may need medicines to control blood pressure.  If you have a high blood pressure reading during one visit or you have normal blood pressure with other risk factors:  You may be asked to return on a different day to have your blood pressure checked again.  You may be asked to monitor your blood pressure at home for 1 week or longer.  If you are diagnosed with hypertension, you may have other blood or imaging tests to help your health care provider understand your overall risk for other conditions. How is this treated? This condition is treated by making healthy lifestyle changes, such as eating healthy foods, exercising more, and reducing your alcohol intake. Your health care provider may prescribe medicine if lifestyle changes are not enough to get your blood pressure under control, and if:  Your  systolic blood pressure is above 130.  Your diastolic blood pressure is above 80.  Your personal target blood pressure may vary depending on your medical conditions, your age, and other factors. Follow these instructions at home: Eating and drinking  Eat a diet that is high in fiber and potassium, and low in sodium, added sugar, and fat. An example eating plan is called the DASH (Dietary Approaches to Stop Hypertension) diet. To eat this way: ? Eat plenty of fresh fruits and vegetables. Try to fill half of your plate at each meal with fruits and vegetables. ? Eat whole grains, such as whole wheat pasta, brown rice, or whole grain bread. Fill about one quarter of your plate with whole grains. ? Eat or drink low-fat dairy products, such as skim milk or low-fat yogurt. ? Avoid fatty cuts of meat, processed or cured meats, and poultry with skin. Fill about one quarter of your plate with lean proteins, such as fish, chicken without skin, beans, eggs, and tofu. ? Avoid premade and processed foods. These tend to be higher in sodium, added sugar, and fat.  Reduce your daily sodium intake. Most people with hypertension should eat less than 1,500 mg of sodium a day.  Limit alcohol intake to no more than 1 drink a day for nonpregnant women and 2 drinks a day for men. One drink equals 12 oz of beer, 5 oz of wine, or 1 oz of hard liquor. Lifestyle  Work with your health care provider to maintain a healthy body weight or to lose weight. Ask what an ideal weight is for you.  Get at least 30 minutes of exercise that causes your heart to beat faster (aerobic exercise) most days of the week. Activities may include walking,  swimming, or biking.  Include exercise to strengthen your muscles (resistance exercise), such as pilates or lifting weights, as part of your weekly exercise routine. Try to do these types of exercises for 30 minutes at least 3 days a week.  Do not use any products that contain nicotine or  tobacco, such as cigarettes and e-cigarettes. If you need help quitting, ask your health care provider.  Monitor your blood pressure at home as told by your health care provider.  Keep all follow-up visits as told by your health care provider. This is important. Medicines  Take over-the-counter and prescription medicines only as told by your health care provider. Follow directions carefully. Blood pressure medicines must be taken as prescribed.  Do not skip doses of blood pressure medicine. Doing this puts you at risk for problems and can make the medicine less effective.  Ask your health care provider about side effects or reactions to medicines that you should watch for. Contact a health care provider if:  You think you are having a reaction to a medicine you are taking.  You have headaches that keep coming back (recurring).  You feel dizzy.  You have swelling in your ankles.  You have trouble with your vision. Get help right away if:  You develop a severe headache or confusion.  You have unusual weakness or numbness.  You feel faint.  You have severe pain in your chest or abdomen.  You vomit repeatedly.  You have trouble breathing. Summary  Hypertension is when the force of blood pumping through your arteries is too strong. If this condition is not controlled, it may put you at risk for serious complications.  Your personal target blood pressure may vary depending on your medical conditions, your age, and other factors. For most people, a normal blood pressure is less than 120/80.  Hypertension is treated with lifestyle changes, medicines, or a combination of both. Lifestyle changes include weight loss, eating a healthy, low-sodium diet, exercising more, and limiting alcohol. This information is not intended to replace advice given to you by your health care provider. Make sure you discuss any questions you have with your health care provider. Document Released:  01/06/2005 Document Revised: 12/05/2015 Document Reviewed: 12/05/2015 Elsevier Interactive Patient Education  2018 [place].   FINDINGS: 12/2015 Cervical MRI  #Vertebral bodies: Normal height and alignment.  #Marrow signal: No significant abnormality. #Cervical spinal cord: No mass or abnormal signal intensity. No abnormal enhancement. No spinal cord compression. #Craniovertebral junction: Normal.   #C2-3: Normal. #C3-4: Moderate facet joint arthritis on the right. #C4-5: Mild facet joint arthritis on the left. #C5-6: Normal. #C6-7: Small right central disc protrusion with minimal impingement on the thecal sac. #C7-T1: Normal.  01/18/16  FINDINGS: #Ventricles: Normal without dilatation, mass effect, or midline shift. #Brain parenchyma: No areas of abnormally increased or abnormally decreased signal intensity are identified.Diffusion-weighted images are normal indicating no acute infarct.No evidence mass or hemorrhage.No abnormal enhancement. #Sella and parasellar region: Normal. #Craniovertebral junction: Normal. #Mastoids and paranasal sinuses: Normal #Orbits: Normal #Major cerebral vessels and dural sinuses: Normal flow voids #Bony structures and scalp: Normal

## 2017-10-07 ENCOUNTER — Encounter: Payer: Self-pay | Admitting: Internal Medicine

## 2017-10-08 LAB — MEASLES/MUMPS/RUBELLA IMMUNITY
Mumps IgG: 181 AU/mL
Rubella: 7.12 index
Rubeola IgG: >300 AU/mL

## 2017-10-12 ENCOUNTER — Encounter: Payer: Self-pay | Admitting: Internal Medicine

## 2017-10-14 ENCOUNTER — Other Ambulatory Visit: Payer: Self-pay | Admitting: Internal Medicine

## 2017-10-14 DIAGNOSIS — R51 Headache: Principal | ICD-10-CM

## 2017-10-14 DIAGNOSIS — G8929 Other chronic pain: Secondary | ICD-10-CM

## 2017-10-14 DIAGNOSIS — R519 Headache, unspecified: Secondary | ICD-10-CM

## 2017-10-20 ENCOUNTER — Telehealth: Payer: Self-pay | Admitting: Neurology

## 2017-10-20 ENCOUNTER — Encounter: Payer: Self-pay | Admitting: Neurology

## 2017-10-20 ENCOUNTER — Ambulatory Visit (INDEPENDENT_AMBULATORY_CARE_PROVIDER_SITE_OTHER): Payer: No Typology Code available for payment source | Admitting: Neurology

## 2017-10-20 VITALS — BP 127/77 | HR 74 | Ht 63.0 in | Wt 181.0 lb

## 2017-10-20 DIAGNOSIS — H538 Other visual disturbances: Secondary | ICD-10-CM

## 2017-10-20 DIAGNOSIS — R51 Headache with orthostatic component, not elsewhere classified: Secondary | ICD-10-CM

## 2017-10-20 DIAGNOSIS — H532 Diplopia: Secondary | ICD-10-CM | POA: Diagnosis not present

## 2017-10-20 DIAGNOSIS — G43711 Chronic migraine without aura, intractable, with status migrainosus: Secondary | ICD-10-CM | POA: Diagnosis not present

## 2017-10-20 DIAGNOSIS — R519 Headache, unspecified: Secondary | ICD-10-CM

## 2017-10-20 DIAGNOSIS — G8929 Other chronic pain: Secondary | ICD-10-CM

## 2017-10-20 HISTORY — DX: Headache, unspecified: R51.9

## 2017-10-20 MED ORDER — PROPRANOLOL HCL ER 80 MG PO CP24: 80.0000 mg | ORAL_CAPSULE | Freq: Every day | ORAL | 3 refills | Status: DC

## 2017-10-20 MED ORDER — KETOROLAC TROMETHAMINE 60 MG/2ML IM SOLN
60.0000 mg | Freq: Once | INTRAMUSCULAR | Status: AC
  Administered 2017-10-20: 60 mg via INTRAMUSCULAR

## 2017-10-20 NOTE — Progress Notes (Signed)
[facility]    Provider:  Dr Jaynee Eagles Referring Provider: McLean-Scocuzza, Olivia Mackie * Primary Care Physician:  McLean-Scocuzza, Nino Glow, MD  CC:  Headaches  HPI:  Kristin [name] is a 57 y.o. female here as requested by Dr. Terese Door for headaches. Migraines in the past was always on the left hand side. She was on Topamax 10 years ago she stopped headaches improved after coming off of caffeine and a massage once a month. She would have occ headaches but not severe not frequent. She started noticing headaches in may and became more severe in September every time she stood up out of the car the headache would be severe, noticed a pattern of worsening headache when standing. No lumbar punctures, no trauma, no new medications, no recent illnesses. She hydrates well. No OTC medication overuse. Mid September the afternoon worsened but lost the positional quality. Now every afternoon she has a headache. Headache is on the top of the head, tightness, worse if she leans over from the throbbing. No light sensitivity, + sound sensitivity. Can wake up with the headaches. Tylenol helps a little bit. Sleeping helps. No other focal neurologic deficits, associated symptoms, inciting events or modifiable factors.  Reviewed notes, labs and imaging from outside physicians, which showed:  Reviewed MRI brain images from 2012 which were normal  Esr/crp nml  Review of Systems: Patient complains of symptoms per HPI as well as the following symptoms: insomnia, headache, dizziness, blurred vision. Pertinent negatives and positives per HPI. All others negative.   Social History   Socioeconomic History  . Marital status: Married    Spouse name: Not on file  . Number of children: 3  . Years of education: Not on file  . Highest education level: Some college, no degree  Occupational History  . Not on file  Social Needs  . Financial resource strain: Not on file  . Food insecurity:   Worry: Not on file    Inability: Not on file  . Transportation needs:    Medical: Not on file    Non-medical: Not on file  Tobacco Use  . Smoking status: Never Smoker  . Smokeless tobacco: Never Used  Substance and Sexual Activity  . Alcohol use: Never    Frequency: Never  . Drug use: Never  . Sexual activity: Yes    Partners: Male    Birth control/protection: Surgical  Lifestyle  . Physical activity:    Days per week: 0 days    Minutes per session: 0 min  . Stress: Not on file  Relationships  . Social connections:    Talks on phone: Not on file    Gets together: Not on file    Attends religious service: Not on file    Active member of club or organization: Not on file    Attends meetings of clubs or organizations: Not on file    Relationship status: Not on file  . Intimate partner violence:    Fear of current or ex partner: Not on file    Emotionally abused: Not on file    Physically abused: Not on file    Forced sexual activity: Not on file  Other Topics Concern  . Not on file  Social History Narrative   Married x 37 years as of 10/2017    Kids    Write childrens books    Retiring from [employer] 01/2017    Enjoys traveling    Right handed   Caffeine: quit 2009  Family History  Problem Relation Age of Onset  . Heart disease Mother   . Breast cancer Mother 39  . Arthritis Mother   . Hyperlipidemia Mother   . Cancer Mother        spinal surgery  . Heart disease Father   . Lung cancer Father   . Hyperlipidemia Father   . Hypertension Father   . Diabetes Sister   . Diabetes Maternal Aunt   . Diabetes Maternal Uncle   . Diabetes Maternal Grandfather   . Hyperlipidemia Maternal Grandfather   . Arthritis Maternal Grandmother   . Sudden death Cousin   . Diabetes Maternal Aunt   . Diabetes Sister   . Colon cancer Neg Hx   . Ovarian cancer Neg Hx     Past Medical History:  Diagnosis Date  . Allergy   . Arthritis   . Basal cell carcinoma    face, back,  neck, arm  . Basal cell carcinoma    nose, left cheek  . Chicken pox   . Complication of anesthesia    blood pressure elevated after gastric sleeve surgery in recovery, pt does not want mask over face  . Depression   . Diabetes mellitus without complication ([place])    pre-diabetic currently, was diabetic prior to gastric sleeve surgery   . Fatty liver   . GERD (gastroesophageal reflux disease)   . Gout   . History of shingles   . Hyperlipidemia   . Hypertension    no problems since losing weight after gastric sleeve surgery  . Insomnia   . Migraine   . Obesity   . Osteopenia     Past Surgical History:  Procedure Laterality Date  . ABDOMINAL HYSTERECTOMY    . ANKLE ARTHROSCOPY Left 05/29/2016   Procedure: ANKLE ARTHROSCOPY;  Surgeon: [name], DPM;  Location: [facility];  Service: Podiatry;  Laterality: Left;  . APPENDECTOMY    . basal cell removed    . [place]  . CHOLECYSTECTOMY    . cyst removed from wrist    . FOOT SURGERY     left foot 11/2016 Dr. Laymond Purser   . left elbow surgery     07/2017 Dr Leanor Kail epicondylitis   . MOHS SURGERY     x 2 face nose and left cheek   . OSTEOCHONDROMA EXCISION Left 05/29/2016   Procedure: OSTEOCHONDRAL DRILLING TALUS;  Surgeon: [name], DPM;  Location: [facility];  Service: Podiatry;  Laterality: Left;  . PLANTAR FASCIA RELEASE Left 05/29/2016   Procedure: ENDOSCOPIC PLANTAR FASCIOTOMY;  Surgeon: [name], DPM;  Location: [facility];  Service: Podiatry;  Laterality: Left;  . ROTATOR CUFF REPAIR     lt shoulder  . STOMACH SURGERY  2017   gastric sleeve surgery  . TENNIS ELBOW RELEASE/NIRSCHEL PROCEDURE Left 08/05/2017   Procedure: TENNIS ELBOW RELEASE/NIRSCHEL PROCEDURE;  Surgeon: Leanor Kail, MD;  Location: [facility];  Service: Orthopedics;  Laterality: Left;  . TUBAL LIGATION    . wedge resection of ovary  1982    Current Outpatient Medications  Medication Sig Dispense Refill  .  amLODipine (NORVASC) 2.5 MG tablet Take 1 tablet (2.5 mg total) by mouth daily. No need to cut pill in 1/2 take whole pill (Patient taking differently: Take 2.5 mg by mouth every evening. ) 90 tablet 1  . estradiol (ESTRACE) 1 MG tablet TAKE 1 TABLET BY MOUTH EVERY DAY 90 tablet 3  .  loratadine (CLARITIN) 10 MG tablet Take 10 mg by mouth daily as needed for allergies.    Marland Kitchen meclizine (ANTIVERT) 12.5 MG tablet Take 1 tablet (12.5 mg total) by mouth 2 (two) times daily as needed for dizziness. 30 tablet 2  . omeprazole (PRILOSEC) 20 MG capsule Take 20 mg by mouth daily as needed (indigestion).    . rosuvastatin (CRESTOR) 20 MG tablet Take 1 tablet (20 mg total) by mouth at bedtime. 90 tablet 3  . zolpidem (AMBIEN CR) 12.5 MG CR tablet Take 1 tablet (12.5 mg total) by mouth at bedtime as needed. 30 tablet 5   No current facility-administered medications for this visit.     Allergies as of 10/20/2017 - Review Complete 10/20/2017  Allergen Reaction Noted  . Tetracyclines & related Swelling and Rash 06/04/2014  . Alcohol Rash 12/07/2014  . Metoprolol Diarrhea 12/07/2014    Vitals: BP 127/77 (BP Location: Right Arm, Patient Position: Sitting)   Pulse 74   Ht '5\' 3"'$  (1.6 m)   Wt 181 lb (82.1 kg)   BMI 32.06 kg/m  Last Weight:  Wt Readings from Last 1 Encounters:  10/20/17 181 lb (82.1 kg)   Last Height:   Ht Readings from Last 1 Encounters:  10/20/17 '5\' 3"'$  (1.6 m)    Physical exam: Exam: Gen: NAD, conversant, well nourised, obese, well groomed                     CV: RRR, no MRG. No Carotid Bruits. No peripheral edema, warm, nontender Eyes: Conjunctivae clear without exudates or hemorrhage  Neuro: Detailed Neurologic Exam  Speech:    Speech is normal; fluent and spontaneous with normal comprehension.  Cognition:    The patient is oriented to person, place, and time;     recent and remote memory intact;     language fluent;     normal attention, concentration,     fund of  knowledge Cranial Nerves:    The pupils are equal, round, and reactive to light. The fundi are normal and spontaneous venous pulsations are present. Visual fields are full to finger confrontation. Extraocular movements are intact. Trigeminal sensation is intact and the muscles of mastication are normal. The face is symmetric. The palate elevates in the midline. Hearing intact. Voice is normal. Shoulder shrug is normal. The tongue has normal motion without fasciculations.   Coordination:    Normal finger to nose and heel to shin. Normal rapid alternating movements.   Gait:    Heel-toe and tandem gait are normal.   Motor Observation:    No asymmetry, no atrophy, and no involuntary movements noted. Tone:    Normal muscle tone.    Posture:    Posture is normal. normal erect    Strength:    Strength is V/V in the upper and lower limbs.      Sensation: intact to LT     Reflex Exam:  DTR's:    Absent AJs otherwise Deep tendon reflexes in the upper and lower extremities are brisk bilaterally.   Toes:    The toes are downgoing bilaterally.   Clonus:    Clonus is absent.      Assessment/Plan:  Patient with new onset intractable headache  MRI brain due to concerning symptoms of morning headaches, positional headaches,vision changes  to look for space occupying mass, chiari, low-csf headache high-csf headache, intracranial hypertension (pseudotumor).  Can try some propranolol for elevated BP and to see if it helps if this  turns out to be migraines but asked her to stop for any side effects whatsoever  Needs to follow up after MRI brain for treatment, scheduled in 3-4 weeks f/u   Orders Placed This Encounter  Procedures  . MR BRAIN W WO CONTRAST    Esr/crp normal   Sarina Ill, MD  [facility] [address] [place], [place] [address]  Phone [phone] Fax [phone]

## 2017-10-20 NOTE — Telephone Encounter (Signed)
[employer] order sent to GI. They obtain the auth and will reach out to the pt to schedule.  °

## 2017-10-20 NOTE — Patient Instructions (Addendum)
MRI brain w/wo contrast F/u 3 weeks  Propranolol extended-release capsules What is this medicine? PROPRANOLOL (proe PRAN oh lole) is a beta-blocker. Beta-blockers reduce the workload on the heart and help it to beat more regularly. This medicine is used to treat high blood pressure, heart muscle disease, and prevent chest pain caused by angina. It is also used to prevent migraine headaches. You should not use this medicine to treat a migraine that has already started. This medicine may be used for other purposes; ask your health care provider or pharmacist if you have questions. COMMON BRAND NAME(S): Inderal LA, Inderal XL, InnoPran XL What should I tell my health care provider before I take this medicine? They need to know if you have any of these conditions: -circulation problems, or blood vessel disease -diabetes -history of heart attack or heart disease, vasospastic angina -kidney disease -liver disease -lung or breathing disease, like asthma or emphysema -pheochromocytoma -slow heart rate -thyroid disease -an unusual or allergic reaction to propranolol, other beta-blockers, medicines, foods, dyes, or preservatives -pregnant or trying to get pregnant -breast-feeding How should I use this medicine? Take this medicine by mouth with a glass of water. Follow the directions on the prescription label. Do not crush or chew. Take your doses at regular intervals. Do not take your medicine more often than directed. Do not stop taking except on the advice of your doctor or health care professional. Talk to your pediatrician regarding the use of this medicine in children. Special care may be needed. Overdosage: If you think you have taken too much of this medicine contact a poison control center or emergency room at once. NOTE: This medicine is only for you. Do not share this medicine with others. What if I miss a dose? If you miss a dose, take it as soon as you can. If it is almost time for your  next dose, take only that dose. Do not take double or extra doses. What may interact with this medicine? Do not take this medicine with any of the following medications: -feverfew -phenothiazines like chlorpromazine, mesoridazine, prochlorperazine, thioridazine This medicine may also interact with the following medications: -aluminum hydroxide gel -antipyrine -antiviral medicines for HIV or AIDS -barbiturates like phenobarbital -certain medicines for blood pressure, heart disease, irregular heart beat -cimetidine -ciprofloxacin -diazepam -fluconazole -haloperidol -isoniazid -medicines for cholesterol like cholestyramine or colestipol -medicines for mental depression -medicines for migraine headache like almotriptan, eletriptan, frovatriptan, naratriptan, rizatriptan, sumatriptan, zolmitriptan -NSAIDs, medicines for pain and inflammation, like ibuprofen or naproxen -phenytoin -rifampin -teniposide -theophylline -thyroid medicines -tolbutamide -warfarin -zileuton This list may not describe all possible interactions. Give your health care provider a list of all the medicines, herbs, non-prescription drugs, or dietary supplements you use. Also tell them if you smoke, drink alcohol, or use illegal drugs. Some items may interact with your medicine. What should I watch for while using this medicine? Visit your doctor or health care professional for regular check ups. Contact your doctor right away if your symptoms worsen. Check your blood pressure and pulse rate regularly. Ask your health care professional what your blood pressure and pulse rate should be, and when you should contact them. Do not stop taking this medicine suddenly. This could lead to serious heart-related effects. You may get drowsy or dizzy. Do not drive, use machinery, or do anything that needs mental alertness until you know how this drug affects you. Do not stand or sit up quickly, especially if you are an older  patient. This reduces the  risk of dizzy or fainting spells. Alcohol can make you more drowsy and dizzy. Avoid alcoholic drinks. This medicine can affect blood sugar levels. If you have diabetes, check with your doctor or health care professional before you change your diet or the dose of your diabetic medicine. Do not treat yourself for coughs, colds, or pain while you are taking this medicine without asking your doctor or health care professional for advice. Some ingredients may increase your blood pressure. What side effects may I notice from receiving this medicine? Side effects that you should report to your doctor or health care professional as soon as possible: -allergic reactions like skin rash, itching or hives, swelling of the face, lips, or tongue -breathing problems -changes in blood sugar -cold hands or feet -difficulty sleeping, nightmares -dry peeling skin -hallucinations -muscle cramps or weakness -slow heart rate -swelling of the legs and ankles -vomiting Side effects that usually do not require medical attention (report to your doctor or health care professional if they continue or are bothersome): -change in sex drive or performance -diarrhea -dry sore eyes -hair loss -nausea -weak or tired This list may not describe all possible side effects. Call your doctor for medical advice about side effects. You may report side effects to FDA at 1-800-FDA-1088. Where should I keep my medicine? Keep out of the reach of children. Store at room temperature between 15 and 30 degrees C (59 and 86 degrees F). Protect from light, moisture and freezing. Keep container tightly closed. Throw away any unused medicine after the expiration date. NOTE: This sheet is a summary. It may not cover all possible information. If you have questions about this medicine, talk to your doctor, pharmacist, or health care provider.  2018 Elsevier/Gold Standard (2012-09-10 14:58:56)

## 2017-10-21 ENCOUNTER — Other Ambulatory Visit: Payer: Self-pay | Admitting: Internal Medicine

## 2017-10-21 ENCOUNTER — Encounter: Payer: Self-pay | Admitting: Internal Medicine

## 2017-10-21 MED ORDER — DIAZEPAM 5 MG PO TABS: 5.0000 mg | ORAL_TABLET | Freq: Once | ORAL | 0 refills | Status: DC | PRN

## 2017-10-25 ENCOUNTER — Ambulatory Visit
Admission: RE | Admit: 2017-10-25 | Discharge: 2017-10-25 | Disposition: A | Discharge status: Home / Self Care | Payer: No Typology Code available for payment source | Source: Ambulatory Visit | Attending: Neurology | Admitting: Neurology

## 2017-10-25 DIAGNOSIS — R51 Headache with orthostatic component, not elsewhere classified: Secondary | ICD-10-CM

## 2017-10-25 DIAGNOSIS — H532 Diplopia: Secondary | ICD-10-CM

## 2017-10-25 DIAGNOSIS — R519 Headache, unspecified: Secondary | ICD-10-CM

## 2017-10-25 DIAGNOSIS — H538 Other visual disturbances: Secondary | ICD-10-CM

## 2017-10-25 MED ORDER — GADOBENATE DIMEGLUMINE 529 MG/ML IV SOLN
15.0000 mL | Freq: Once | INTRAVENOUS | Status: AC | PRN
  Administered 2017-10-25: 15 mL via INTRAVENOUS

## 2017-10-26 ENCOUNTER — Telehealth: Payer: Self-pay | Admitting: Neurology

## 2017-10-26 NOTE — Telephone Encounter (Signed)
Pt has called asking if she could be called with results as soon as possible from her MRI on 10-25-2017.  Pt is to go out of [place] to care for her mother but will not be permitted to go without hearing results 1st.  Pt aware Dr Jaynee Eagles is with pt's and as soon as results are available she will be called

## 2017-10-26 NOTE — Telephone Encounter (Signed)
It usually takes a few days but we will call her this week with results as soon as the neuroradiologist reads it and I have a change to look at images as well thanks

## 2017-10-27 NOTE — Telephone Encounter (Signed)
Spoke with Kristin [name] about her MRI results. She verbalized understanding. No questions or concerns at this time.

## 2017-10-27 NOTE — Telephone Encounter (Signed)
Spoke with Ms. Kristin [name] and she stated that she looked on mychart and that her results were normal. I advised the patient that the images still needed to be looked over before she could travel to take care of her mother. Patient verbalized understanding.

## 2017-10-27 NOTE — Telephone Encounter (Signed)
MRI of the brain was unremarkable, she can travel. thanks

## 2017-11-05 ENCOUNTER — Ambulatory Visit: Payer: No Typology Code available for payment source

## 2017-11-10 ENCOUNTER — Encounter: Payer: Self-pay | Admitting: Neurology

## 2017-11-10 ENCOUNTER — Ambulatory Visit (INDEPENDENT_AMBULATORY_CARE_PROVIDER_SITE_OTHER): Payer: No Typology Code available for payment source | Admitting: Neurology

## 2017-11-10 VITALS — BP 126/75 | HR 60 | Ht 63.0 in | Wt 183.0 lb

## 2017-11-10 DIAGNOSIS — G43711 Chronic migraine without aura, intractable, with status migrainosus: Secondary | ICD-10-CM

## 2017-11-10 MED ORDER — FLUOXETINE HCL 20 MG PO CAPS: 20.0000 mg | ORAL_CAPSULE | Freq: Every day | ORAL | 11 refills | Status: DC

## 2017-11-10 MED ORDER — ONDANSETRON 4 MG PO TBDP: 4.0000 mg | ORAL_TABLET | Freq: Three times a day (TID) | ORAL | 3 refills | Status: DC | PRN

## 2017-11-10 MED ORDER — RIZATRIPTAN BENZOATE 10 MG PO TBDP: 10.0000 mg | ORAL_TABLET | ORAL | 11 refills | Status: DC | PRN

## 2017-11-10 MED ORDER — GALCANEZUMAB-GNLM 120 MG/ML ~~LOC~~ SOAJ: 120.0000 mg | SUBCUTANEOUS | 11 refills | Status: DC

## 2017-11-10 NOTE — Patient Instructions (Addendum)
Continue Propranolol Start Prozac 20mg  Start Emgality Rizatriptan: Please take one tablet at the onset of your headache. If it does not improve the symptoms please take one additional tablet. Do not take more then 2 tablets in 24hrs. Do not take use more then 2 to 3 times in a week. Ondansetron for nausea or dizziness   Fluoxetine capsules or tablets (Depression/Mood Disorders) What is this medicine? FLUOXETINE (floo OX e teen) belongs to a class of drugs known as selective serotonin reuptake inhibitors (SSRIs). It helps to treat mood problems such as depression, obsessive compulsive disorder, and panic attacks. It can also treat certain eating disorders. This medicine may be used for other purposes; ask your health care provider or pharmacist if you have questions. COMMON BRAND NAME(S): Prozac What should I tell my health care provider before I take this medicine? They need to know if you have any of these conditions: -bipolar disorder or a family history of bipolar disorder -bleeding disorders -glaucoma -heart disease -liver disease -low levels of sodium in the blood -seizures -suicidal thoughts, plans, or attempt; a previous suicide attempt by you or a family member -take MAOIs like Carbex, Eldepryl, Marplan, Nardil, and Parnate -take medicines that treat or prevent blood clots -thyroid disease -an unusual or allergic reaction to fluoxetine, other medicines, foods, dyes, or preservatives -pregnant or trying to get pregnant -breast-feeding How should I use this medicine? Take this medicine by mouth with a glass of water. Follow the directions on the prescription label. You can take this medicine with or without food. Take your medicine at regular intervals. Do not take it more often than directed. Do not stop taking this medicine suddenly except upon the advice of your doctor. Stopping this medicine too quickly may cause serious side effects or your condition may worsen. A special  MedGuide will be given to you by the pharmacist with each prescription and refill. Be sure to read this information carefully each time. Talk to your pediatrician regarding the use of this medicine in children. While this drug may be prescribed for children as young as 7 years for selected conditions, precautions do apply. Overdosage: If you think you have taken too much of this medicine contact a poison control center or emergency room at once. NOTE: This medicine is only for you. Do not share this medicine with others. What if I miss a dose? If you miss a dose, skip the missed dose and go back to your regular dosing schedule. Do not take double or extra doses. What may interact with this medicine? Do not take this medicine with any of the following medications: -other medicines containing fluoxetine, like Sarafem or Symbyax -cisapride -linezolid -MAOIs like Carbex, Eldepryl, Marplan, Nardil, and Parnate -methylene blue (injected into a vein) -pimozide -thioridazine This medicine may also interact with the following medications: -alcohol -amphetamines -aspirin and aspirin-like medicines -carbamazepine -certain medicines for depression, anxiety, or psychotic disturbances -certain medicines for migraine headaches like almotriptan, eletriptan, frovatriptan, naratriptan, rizatriptan, sumatriptan, zolmitriptan -digoxin -diuretics -fentanyl -flecainide -furazolidone -isoniazid -lithium -medicines for sleep -medicines that treat or prevent blood clots like warfarin, enoxaparin, and dalteparin -NSAIDs, medicines for pain and inflammation, like ibuprofen or naproxen -phenytoin -procarbazine -propafenone -rasagiline -ritonavir -supplements like St. John's wort, kava kava, valerian -tramadol -tryptophan -vinblastine This list may not describe all possible interactions. Give your health care provider a list of all the medicines, herbs, non-prescription drugs, or dietary supplements you  use. Also tell them if you smoke, drink alcohol, or use illegal drugs.  Some items may interact with your medicine. What should I watch for while using this medicine? Tell your doctor if your symptoms do not get better or if they get worse. Visit your doctor or health care professional for regular checks on your progress. Because it may take several weeks to see the full effects of this medicine, it is important to continue your treatment as prescribed by your doctor. Patients and their families should watch out for new or worsening thoughts of suicide or depression. Also watch out for sudden changes in feelings such as feeling anxious, agitated, panicky, irritable, hostile, aggressive, impulsive, severely restless, overly excited and hyperactive, or not being able to sleep. If this happens, especially at the beginning of treatment or after a change in dose, call your health care professional. Dennis Bast may get drowsy or dizzy. Do not drive, use machinery, or do anything that needs mental alertness until you know how this medicine affects you. Do not stand or sit up quickly, especially if you are an older patient. This reduces the risk of dizzy or fainting spells. Alcohol may interfere with the effect of this medicine. Avoid alcoholic drinks. Your mouth may get dry. Chewing sugarless gum or sucking hard candy, and drinking plenty of water may help. Contact your doctor if the problem does not go away or is severe. This medicine may affect blood sugar levels. If you have diabetes, check with your doctor or health care professional before you change your diet or the dose of your diabetic medicine. What side effects may I notice from receiving this medicine? Side effects that you should report to your doctor or health care professional as soon as possible: -allergic reactions like skin rash, itching or hives, swelling of the face, lips, or tongue -anxious -black, tarry stools -breathing problems -changes in  vision -confusion -elevated mood, decreased need for sleep, racing thoughts, impulsive behavior -eye pain -fast, irregular heartbeat -feeling faint or lightheaded, falls -feeling agitated, angry, or irritable -hallucination, loss of contact with reality -loss of balance or coordination -loss of memory -painful or prolonged erections -restlessness, pacing, inability to keep still -seizures -stiff muscles -suicidal thoughts or other mood changes -trouble sleeping -unusual bleeding or bruising -unusually weak or tired -vomiting Side effects that usually do not require medical attention (report to your doctor or health care professional if they continue or are bothersome): -change in appetite or weight -change in sex drive or performance -diarrhea -dry mouth -headache -increased sweating -nausea -tremors This list may not describe all possible side effects. Call your doctor for medical advice about side effects. You may report side effects to FDA at 1-800-FDA-1088. Where should I keep my medicine? Keep out of the reach of children. Store at room temperature between 15 and 30 degrees C (59 and 86 degrees F). Throw away any unused medicine after the expiration date. NOTE: This sheet is a summary. It may not cover all possible information. If you have questions about this medicine, talk to your doctor, pharmacist, or health care provider.  2018 Elsevier/Gold Standard (2015-06-09 15:55:27)   Galcanezumab: Patient drug information Access [employer] here. Copyright [id number] [employer] All rights reserved. (For additional information see "Galcanezumab: Drug information") Brand Names: [place]  Emgality;  Emgality (300 MG Dose)  What is this drug used for?   It is used to prevent migraine headaches.   It is used to treat cluster headaches.  What do I need to tell my doctor BEFORE I take this drug?   If you  have an allergy to this drug or any part of this drug.   If you  are allergic to any drugs like this one, any other drugs, foods, or other substances. Tell your doctor about the allergy and what signs you had, like rash; hives; itching; shortness of breath; wheezing; cough; swelling of face, lips, tongue, or throat; or any other signs.   This drug may interact with other drugs or health problems.   Tell your doctor and pharmacist about all of your drugs (prescription or OTC, natural products, vitamins) and health problems. You must check to make sure that it is safe for you to take this drug with all of your drugs and health problems. Do not start, stop, or change the dose of any drug without checking with your doctor.  What are some things I need to know or do while I take this drug?   Tell all of your health care providers that you take this drug. This includes your doctors, nurses, pharmacists, and dentists.   Do not share pen or cartridge devices with another person even if the needle has been changed. Sharing these devices may pass infections from one person to another. This includes infections you may not know you have.   Tell your doctor if you are pregnant, plan on getting pregnant, or are breast-feeding. You will need to talk about the benefits and risks to you and the baby.  What are some side effects that I need to call my doctor about right away?   WARNING/CAUTION: Even though it may be rare, some people may have very bad and sometimes deadly side effects when taking a drug. Tell your doctor or get medical help right away if you have any of the following signs or symptoms that may be related to a very bad side effect:   Signs of an allergic reaction, like rash; hives; itching; red, swollen, blistered, or peeling skin with or without fever; wheezing; tightness in the chest or throat; trouble breathing, swallowing, or talking; unusual hoarseness; or swelling of the mouth, face, lips, tongue, or throat.  What are some other side effects of this drug?   All  drugs may cause side effects. However, many people have no side effects or only have minor side effects. Call your doctor or get medical help if any of these side effects or any other side effects bother you or do not go away:   Irritation where the shot is given.   These are not all of the side effects that may occur. If you have questions about side effects, call your doctor. Call your doctor for medical advice about side effects.   You may report side effects to your national health agency.  How is this drug best taken?   Use this drug as ordered by your doctor. Read all information given to you. Follow all instructions closely.   It is given as a shot into the fatty part of the skin in the upper arm, thigh, buttocks, or stomach area.   If you will be giving yourself the shot, your doctor or nurse will teach you how to give the shot.   Wash your hands before use.   If stored in a refrigerator, let this drug come to room temperature before using it. Leave it at room temperature for at least 30 minutes. Do not heat this drug.   Do not shake.   Do not give into skin that is irritated, bruised, red, infected, or scarred.  Do not give into skin within 2 inches of the belly button.   If your dose is more than 1 injection, you may give it in the same body part. Make sure you do not give injections in the same spot you used for the other injections.   Do not rub the site where you give the shot.   Do not use if the solution is cloudy, leaking, or has particles.   This drug is colorless to slightly yellow or slightly brown. Do not use if the solution changes color.   Move the site where you give the shot with each shot.   If you drop this drug on a hard surface, do not use it.   Each prefilled pen or syringe is for one use only.   Throw away needles in a needle/sharp disposal box. Do not reuse needles or other items. When the box is full, follow all local rules for getting rid of it. Talk with a  doctor or pharmacist if you have any questions.  What do I do if I miss a dose?   Take a missed dose as soon as you think about it.   After taking a missed dose, start a new schedule based on when the dose is taken.  How do I store and/or throw out this drug?   Store in a refrigerator. Do not freeze.   Store in the carton to protect from light.   If needed, you may store at room temperature for up to 7 days. Write down the date you take this drug out of the refrigerator. If stored at room temperature and not used within 7 days, throw this drug away.   Do not put this drug back in the refrigerator after it has been stored at room temperature.   Keep all drugs in a safe place. Keep all drugs out of the reach of children and pets.   Throw away unused or expired drugs. Do not flush down a toilet or pour down a drain unless you are told to do so. Check with your pharmacist if you have questions about the best way to throw out drugs. There may be drug take-back programs in your area.  General drug facts   If your symptoms or health problems do not get better or if they become worse, call your doctor.   Do not share your drugs with others and do not take anyone else's drugs.   Keep a list of all your drugs (prescription, natural products, vitamins, OTC) with you. Give this list to your doctor.   Talk with the doctor before starting any new drug, including prescription or OTC, natural products, or vitamins.   Some drugs may have another patient information leaflet. If you have any questions about this drug, please talk with your doctor, nurse, pharmacist, or other health care provider.   If you think there has been an overdose, call your poison control center or get medical care right away. Be ready to tell or show what was taken, how much, and when it happened.  Rizatriptan tablets What is this medicine? RIZATRIPTAN (rye za TRIP tan) is used to treat migraines with or without aura. An aura is a  strange feeling or visual disturbance that warns you of an attack. It is not used to prevent migraines. This medicine may be used for other purposes; ask your health care provider or pharmacist if you have questions. COMMON BRAND NAME(S): Maxalt What should I tell my health care  provider before I take this medicine? They need to know if you have any of these conditions: -bowel disease or colitis -diabetes -family history of heart disease -fast or irregular heart beat -heart or blood vessel disease, angina (chest pain), or previous heart attack -high blood pressure -high cholesterol -history of stroke, transient ischemic attacks (TIAs or mini-strokes), or intracranial bleeding -kidney or liver disease -overweight -poor circulation -postmenopausal or surgical removal of uterus and ovaries -Raynaud's disease -seizure disorder -an unusual or allergic reaction to rizatriptan, other medicines, foods, dyes, or preservatives -pregnant or trying to get pregnant -breast-feeding How should I use this medicine? This medicine is taken by mouth with a glass of water. Follow the directions on the prescription label. This medicine is taken at the first symptoms of a migraine. It is not for everyday use. If your migraine headache returns after one dose, you can take another dose as directed. You must leave at least 2 hours between doses, and do not take more than 30 mg total in 24 hours. If there is no improvement at all after the first dose, do not take a second dose without talking to your doctor or health care professional. Do not take your medicine more often than directed. Talk to your pediatrician regarding the use of this medicine in children. While this drug may be prescribed for children as young as 6 years for selected conditions, precautions do apply. Overdosage: If you think you have taken too much of this medicine contact a poison control center or emergency room at once. NOTE: This medicine is  only for you. Do not share this medicine with others. What if I miss a dose? This does not apply; this medicine is not for regular use. What may interact with this medicine? Do not take this medicine with any of the following medicines: -amphetamine, dextroamphetamine or cocaine -dihydroergotamine, ergotamine, ergoloid mesylates, methysergide, or ergot-type medication - do not take within 24 hours of taking rizatriptan -feverfew -MAOIs like Carbex, Eldepryl, Marplan, Nardil, and Parnate - do not take rizatriptan within 2 weeks of stopping MAOI therapy. -other migraine medicines like almotriptan, eletriptan, naratriptan, sumatriptan, zolmitriptan - do not take within 24 hours of taking rizatriptan -tryptophan This medicine may also interact with the following medications: -medicines for mental depression, anxiety or mood problems -propranolol This list may not describe all possible interactions. Give your health care provider a list of all the medicines, herbs, non-prescription drugs, or dietary supplements you use. Also tell them if you smoke, drink alcohol, or use illegal drugs. Some items may interact with your medicine. What should I watch for while using this medicine? Only take this medicine for a migraine headache. Take it if you get warning symptoms or at the start of a migraine attack. It is not for regular use to prevent migraine attacks. You may get drowsy or dizzy. Do not drive, use machinery, or do anything that needs mental alertness until you know how this medicine affects you. To reduce dizzy or fainting spells, do not sit or stand up quickly, especially if you are an older patient. Alcohol can increase drowsiness, dizziness and flushing. Avoid alcoholic drinks. Smoking cigarettes may increase the risk of heart-related side effects from using this medicine. If you take migraine medicines for 10 or more days a month, your migraines may get worse. Keep a diary of headache days and  medicine use. Contact your healthcare professional if your migraine attacks occur more frequently. What side effects may I notice from receiving this  medicine? Side effects that you should report to your doctor or health care professional as soon as possible: -allergic reactions like skin rash, itching or hives, swelling of the face, lips, or tongue -fast, slow, or irregular heart beat -increased or decreased blood pressure -seizures -severe stomach pain and cramping, bloody diarrhea -signs and symptoms of a blood clot such as breathing problems; changes in vision; chest pain; severe, sudden headache; pain, swelling, warmth in the leg; trouble speaking; sudden numbness or weakness of the face, arm or leg -tingling, pain, or numbness in the face, hands, or feet Side effects that usually do not require medical attention (report to your doctor or health care professional if they continue or are bothersome): -drowsiness -dry mouth -feeling warm, flushing, or redness of the face -headache -muscle cramps, pain -nausea, vomiting -unusually weak or tired This list may not describe all possible side effects. Call your doctor for medical advice about side effects. You may report side effects to FDA at 1-800-FDA-1088. Where should I keep my medicine? Keep out of the reach of children. Store at room temperature between 15 and 30 degrees C (59 and 86 degrees F). Keep container tightly closed. Throw away any unused medicine after the expiration date. NOTE: This sheet is a summary. It may not cover all possible information. If you have questions about this medicine, talk to your doctor, pharmacist, or health care provider.  2018 Elsevier/Gold Standard (2012-09-07 10:16:39)  Ondansetron tablets What is this medicine? ONDANSETRON (on DAN se tron) is used to treat nausea and vomiting caused by chemotherapy. It is also used to prevent or treat nausea and vomiting after surgery. This medicine may be used  for other purposes; ask your health care provider or pharmacist if you have questions. COMMON BRAND NAME(S): Zofran What should I tell my health care provider before I take this medicine? They need to know if you have any of these conditions: -heart disease -history of irregular heartbeat -liver disease -low levels of magnesium or potassium in the blood -an unusual or allergic reaction to ondansetron, granisetron, other medicines, foods, dyes, or preservatives -pregnant or trying to get pregnant -breast-feeding How should I use this medicine? Take this medicine by mouth with a glass of water. Follow the directions on your prescription label. Take your doses at regular intervals. Do not take your medicine more often than directed. Talk to your pediatrician regarding the use of this medicine in children. Special care may be needed. Overdosage: If you think you have taken too much of this medicine contact a poison control center or emergency room at once. NOTE: This medicine is only for you. Do not share this medicine with others. What if I miss a dose? If you miss a dose, take it as soon as you can. If it is almost time for your next dose, take only that dose. Do not take double or extra doses. What may interact with this medicine? Do not take this medicine with any of the following medications: -apomorphine -certain medicines for fungal infections like fluconazole, itraconazole, ketoconazole, posaconazole, voriconazole -cisapride -dofetilide -dronedarone -pimozide -thioridazine -ziprasidone This medicine may also interact with the following medications: -carbamazepine -certain medicines for depression, anxiety, or psychotic disturbances -fentanyl -linezolid -MAOIs like Carbex, Eldepryl, Marplan, Nardil, and Parnate -methylene blue (injected into a vein) -other medicines that prolong the QT interval (cause an abnormal heart rhythm) -phenytoin -rifampicin -tramadol This list may  not describe all possible interactions. Give your health care provider a list of all the medicines,  herbs, non-prescription drugs, or dietary supplements you use. Also tell them if you smoke, drink alcohol, or use illegal drugs. Some items may interact with your medicine. What should I watch for while using this medicine? Check with your doctor or health care professional right away if you have any sign of an allergic reaction. What side effects may I notice from receiving this medicine? Side effects that you should report to your doctor or health care professional as soon as possible: -allergic reactions like skin rash, itching or hives, swelling of the face, lips or tongue -breathing problems -confusion -dizziness -fast or irregular heartbeat -feeling faint or lightheaded, falls -fever and chills -loss of balance or coordination -seizures -sweating -swelling of the hands or feet -tightness in the chest -tremors -unusually weak or tired Side effects that usually do not require medical attention (report to your doctor or health care professional if they continue or are bothersome): -constipation or diarrhea -headache This list may not describe all possible side effects. Call your doctor for medical advice about side effects. You may report side effects to FDA at 1-800-FDA-1088. Where should I keep my medicine? Keep out of the reach of children. Store between 2 and 30 degrees C (36 and 86 degrees F). Throw away any unused medicine after the expiration date. NOTE: This sheet is a summary. It may not cover all possible information. If you have questions about this medicine, talk to your doctor, pharmacist, or health care provider.  2018 [employer]/Gold Standard (2012-10-13 16:27:45)

## 2017-11-10 NOTE — Progress Notes (Signed)
[facility]    Provider:  Dr Jaynee Eagles Referring Provider: McLean-Scocuzza, Olivia Mackie * Primary Care Physician:  McLean-Scocuzza, Nino Glow, MD  CC:  Headaches  Interval history 11/10/2017: MR of the brain showed frontal chronic sinusitis, discussed seeing ENT. Propranolol is helping, the headaches are not as bad. She has been tested in the past recently 3 years ago and no sleep apnea.  She is under more stress now. Her pulse is 60 cannot increase propranolol further. Discussed options, due to stress may start Prozac which is a migraine preventative and can help with stress. Zofran for dizziness or nausea. Also start Emgality.   Medications tried: propranolol, topiramate, prozac, nortriptyline, verapamil   Reviewed MRI of the brain images with patient and agree with th following:  This MRI of the brain with and without contrast shows the following: 1.    Couple punctate T2/FLAIR hyperintense foci in the subcortical white matter.  These are also seen on the 2012 MRI.  They are nonspecific but could represent very minimal chronic microvascular ischemic changes or sequela of migraine headaches. 2.    Chronic right frontal sinusitis. 3.    There are no acute findings and there is a normal enhancement pattern.  HPI:  Kristin [name] is a 57 y.o. female here as requested by Dr. Terese Door for headaches. Migraines in the past was always on the left hand side. She was on Topamax 10 years ago she stopped headaches improved after coming off of caffeine and a massage once a month. She would have occ headaches but not severe not frequent. She started noticing headaches in may and became more severe in September every time she stood up out of the car the headache would be severe, noticed a pattern of worsening headache when standing. No lumbar punctures, no trauma, no new medications, no recent illnesses. She hydrates well. No OTC medication overuse. Mid September the afternoon worsened  but lost the positional quality. Now every afternoon she has a headache. Headache is on the top of the head, tightness, worse if she leans over from the throbbing. No light sensitivity, + sound sensitivity. Can wake up with the headaches. Tylenol helps a little bit. Sleeping helps. No other focal neurologic deficits, associated symptoms, inciting events or modifiable factors.  Reviewed notes, labs and imaging from outside physicians, which showed:  Reviewed MRI brain images from 2012 which were normal  Esr/crp nml  Review of Systems: Patient complains of symptoms per HPI as well as the following symptoms: insomnia, headache, dizziness, blurred vision. Pertinent negatives and positives per HPI. All others negative.   Social History   Socioeconomic History  . Marital status: Married    Spouse name: Not on file  . Number of children: 3  . Years of education: Not on file  . Highest education level: Some college, no degree  Occupational History  . Not on file  Social Needs  . Financial resource strain: Not on file  . Food insecurity:    Worry: Not on file    Inability: Not on file  . Transportation needs:    Medical: Not on file    Non-medical: Not on file  Tobacco Use  . Smoking status: Never Smoker  . Smokeless tobacco: Never Used  Substance and Sexual Activity  . Alcohol use: Never    Frequency: Never  . Drug use: Never  . Sexual activity: Yes    Partners: Male    Birth control/protection: Surgical  Lifestyle  . Physical activity:  Days per week: 0 days    Minutes per session: 0 min  . Stress: Not on file  Relationships  . Social connections:    Talks on phone: Not on file    Gets together: Not on file    Attends religious service: Not on file    Active member of club or organization: Not on file    Attends meetings of clubs or organizations: Not on file    Relationship status: Not on file  . Intimate partner violence:    Fear of current or ex partner: Not on  file    Emotionally abused: Not on file    Physically abused: Not on file    Forced sexual activity: Not on file  Other Topics Concern  . Not on file  Social History Narrative   Married x 37 years as of 10/2017    Kids    Write childrens books    Retiring from [employer] 01/2017    Enjoys traveling    Right handed   Caffeine: quit 2009    Family History  Problem Relation Age of Onset  . Heart disease Mother   . Breast cancer Mother 37       metastatic, now hip, spine, and leg   . Arthritis Mother   . Hyperlipidemia Mother   . Cancer Mother        spinal surgery  . Heart disease Father   . Lung cancer Father   . Hyperlipidemia Father   . Hypertension Father   . Diabetes Sister   . Diabetes Maternal Aunt   . Diabetes Maternal Uncle   . Diabetes Maternal Grandfather   . Hyperlipidemia Maternal Grandfather   . Arthritis Maternal Grandmother   . Sudden death Cousin   . Diabetes Maternal Aunt   . Diabetes Sister   . Colon cancer Neg Hx   . Ovarian cancer Neg Hx     Past Medical History:  Diagnosis Date  . Allergy   . Arthritis   . Basal cell carcinoma    face, back, neck, arm  . Basal cell carcinoma    nose, left cheek  . Chicken pox   . Complication of anesthesia    blood pressure elevated after gastric sleeve surgery in recovery, pt does not want mask over face  . Depression   . Diabetes mellitus without complication ([place])    pre-diabetic currently, was diabetic prior to gastric sleeve surgery   . Fatty liver   . GERD (gastroesophageal reflux disease)   . Gout   . History of shingles   . Hyperlipidemia   . Hypertension    no problems since losing weight after gastric sleeve surgery  . Insomnia   . Migraine   . Obesity   . Osteopenia     Past Surgical History:  Procedure Laterality Date  . ABDOMINAL HYSTERECTOMY    . ANKLE ARTHROSCOPY Left 05/29/2016   Procedure: ANKLE ARTHROSCOPY;  Surgeon: [name], DPM;  Location: [place];  Service: Podiatry;   Laterality: Left;  . APPENDECTOMY    . basal cell removed    . [place]  . CHOLECYSTECTOMY    . cyst removed from wrist    . FOOT SURGERY     left foot 11/2016 Dr. Laymond Purser   . left elbow surgery     07/2017 Dr Leanor Kail epicondylitis   . MOHS SURGERY     x 2 face nose and  left cheek   . OSTEOCHONDROMA EXCISION Left 05/29/2016   Procedure: OSTEOCHONDRAL DRILLING TALUS;  Surgeon: [name], DPM;  Location: [place];  Service: Podiatry;  Laterality: Left;  . PLANTAR FASCIA RELEASE Left 05/29/2016   Procedure: ENDOSCOPIC PLANTAR FASCIOTOMY;  Surgeon: [name], DPM;  Location: [place];  Service: Podiatry;  Laterality: Left;  . ROTATOR CUFF REPAIR     lt shoulder  . STOMACH SURGERY  2017   gastric sleeve surgery  . TENNIS ELBOW RELEASE/NIRSCHEL PROCEDURE Left 08/05/2017   Procedure: TENNIS ELBOW RELEASE/NIRSCHEL PROCEDURE;  Surgeon: Leanor Kail, MD;  Location: [facility];  Service: Orthopedics;  Laterality: Left;  . TUBAL LIGATION    . wedge resection of ovary  1982    Current Outpatient Medications  Medication Sig Dispense Refill  . amLODipine (NORVASC) 2.5 MG tablet Take 1 tablet (2.5 mg total) by mouth daily. No need to cut pill in 1/2 take whole pill (Patient taking differently: Take 2.5 mg by mouth every evening. ) 90 tablet 1  . estradiol (ESTRACE) 1 MG tablet TAKE 1 TABLET BY MOUTH EVERY DAY 90 tablet 3  . loratadine (CLARITIN) 10 MG tablet Take 10 mg by mouth daily as needed for allergies.    Marland Kitchen meclizine (ANTIVERT) 12.5 MG tablet Take 1 tablet (12.5 mg total) by mouth 2 (two) times daily as needed for dizziness. 30 tablet 2  . omeprazole (PRILOSEC) 20 MG capsule Take 20 mg by mouth daily as needed (indigestion).    . propranolol ER (INDERAL LA) 80 MG 24 hr capsule Take 1 capsule (80 mg total) by mouth at bedtime. 30 capsule 3  . rosuvastatin (CRESTOR) 20 MG tablet Take 1 tablet (20 mg total) by mouth at bedtime. 90 tablet 3  . zolpidem  (AMBIEN CR) 12.5 MG CR tablet Take 1 tablet (12.5 mg total) by mouth at bedtime as needed. (Patient taking differently: Take 12.5 mg by mouth at bedtime. ) 30 tablet 5   No current facility-administered medications for this visit.     Allergies as of 11/10/2017 - Review Complete 11/10/2017  Allergen Reaction Noted  . Tetracyclines & related Swelling and Rash 06/04/2014  . Alcohol Rash 12/07/2014  . Metoprolol Diarrhea 12/07/2014    Vitals: BP 126/75 (BP Location: Right Arm, Patient Position: Sitting)   Pulse 60   Ht '5\' 3"'$  (1.6 m)   Wt 183 lb (83 kg)   BMI 32.42 kg/m  Last Weight:  Wt Readings from Last 1 Encounters:  11/10/17 183 lb (83 kg)   Last Height:   Ht Readings from Last 1 Encounters:  11/10/17 '5\' 3"'$  (1.6 m)    Physical exam: Exam: Gen: NAD, conversant, well nourised, obese, well groomed                     CV: RRR, no MRG. No Carotid Bruits. No peripheral edema, warm, nontender Eyes: Conjunctivae clear without exudates or hemorrhage  Neuro: Detailed Neurologic Exam  Speech:    Speech is normal; fluent and spontaneous with normal comprehension.  Cognition:    The patient is oriented to person, place, and time;     recent and remote memory intact;     language fluent;     normal attention, concentration,     fund of knowledge Cranial Nerves:    The pupils are equal, round, and reactive to light. The fundi are normal and spontaneous venous pulsations are present. Visual fields are full to finger confrontation. Extraocular movements are  intact. Trigeminal sensation is intact and the muscles of mastication are normal. The face is symmetric. The palate elevates in the midline. Hearing intact. Voice is normal. Shoulder shrug is normal. The tongue has normal motion without fasciculations.   Coordination:    Normal finger to nose and heel to shin. Normal rapid alternating movements.   Gait:    Heel-toe and tandem gait are normal.   Motor Observation:    No  asymmetry, no atrophy, and no involuntary movements noted. Tone:    Normal muscle tone.    Posture:    Posture is normal. normal erect    Strength:    Strength is V/V in the upper and lower limbs.      Sensation: intact to LT     Reflex Exam:  DTR's:    Absent AJs otherwise Deep tendon reflexes in the upper and lower extremities are brisk bilaterally.   Toes:    The toes are downgoing bilaterally.   Clonus:    Clonus is absent.      Assessment/Plan:  Patient with new onset intractable headache. Likely migraines.  MRI brain unremarkable. Some frontal acute sinusitis, f/u with pcp or ent  Continue propranolol for elevated BP and migraine prevention. She feels improved on the propranolol however her pulse today is 60, cannot increase any further.  Esr/crp normal  She is under significant stress. Start prozac for migraine prevention ans may help with stress. Start Emgality for migraine prevention.  Acute: Maxalt and Zofran  Medications tried: propranolol, topiramate, prozac, nortriptyline, verapamil  Meds ordered this encounter  Medications  . FLUoxetine (PROZAC) 20 MG capsule    Sig: Take 1 capsule (20 mg total) by mouth daily.    Dispense:  30 capsule    Refill:  11  . Galcanezumab-gnlm (EMGALITY) 120 MG/ML SOAJ    Sig: Inject 120 mg into the skin every 30 (thirty) days.    Dispense:  1 pen    Refill:  11    Patient has a copay card can have medication for $0 regardless of insurance approval or copay  . rizatriptan (MAXALT-MLT) 10 MG disintegrating tablet    Sig: Take 1 tablet (10 mg total) by mouth as needed for migraine. May repeat in 2 hours if needed    Dispense:  10 tablet    Refill:  11  . ondansetron (ZOFRAN-ODT) 4 MG disintegrating tablet    Sig: Take 1 tablet (4 mg total) by mouth every 8 (eight) hours as needed for nausea.    Dispense:  30 tablet    Refill:  3     Sarina Ill, MD  [facility] [address] [place], [place] [address]  A total of 25 minutes was spent face-to-face with this patient. Over half this time was spent on counseling patient on the  1. Chronic migraine without aura, with intractable migraine, so stated, with status migrainosus     diagnosis and different diagnostic and therapeutic options, counseling and coordination of care, risks ans benefits of management, compliance, or risk factor reduction and education.    Phone [phone] Fax [phone]

## 2017-11-25 ENCOUNTER — Encounter: Payer: Self-pay | Admitting: Internal Medicine

## 2017-11-26 ENCOUNTER — Other Ambulatory Visit: Payer: Self-pay | Admitting: Internal Medicine

## 2017-11-26 DIAGNOSIS — J329 Chronic sinusitis, unspecified: Secondary | ICD-10-CM

## 2017-11-26 MED ORDER — AZITHROMYCIN 250 MG PO TABS: ORAL_TABLET | ORAL | 0 refills | Status: DC

## 2017-12-02 ENCOUNTER — Other Ambulatory Visit: Payer: Self-pay | Admitting: Neurology

## 2017-12-15 ENCOUNTER — Other Ambulatory Visit: Payer: Self-pay | Admitting: Internal Medicine

## 2017-12-15 DIAGNOSIS — G4700 Insomnia, unspecified: Secondary | ICD-10-CM

## 2017-12-15 NOTE — Telephone Encounter (Signed)
Copied from [place] [phone]. Topic: Quick Communication - Rx Refill/Question >> Dec 15, 2017  9:23 AM Antonieta Iba C wrote: Medication: zolpidem (AMBIEN CR) 12.5 MG CR tablet  Has the patient contacted their pharmacy? Yes  (Agent: If no, request that the patient contact the pharmacy for the refill.) (Agent: If yes, when and what did the pharmacy advise?)  Preferred Pharmacy (with phone number or street name): [id number] - [place], [place]  Agent: Please be advised that RX refills may take up to 3 business days. We ask that you follow-up with your pharmacy.

## 2017-12-15 NOTE — Telephone Encounter (Signed)
Requested medication (s) are due for refill today:   Requested medication (s) are on the active medication list: yes    Last refill: 10/06/17  Future visit scheduled no  Notes to clinic:not delegated  Requested Prescriptions  Pending Prescriptions Disp Refills   zolpidem (AMBIEN CR) 12.5 MG CR tablet 30 tablet 5    Sig: Take 1 tablet (12.5 mg total) by mouth at bedtime as needed.     Not Delegated - Psychiatry:  Anxiolytics/Hypnotics Failed - 12/15/2017  1:17 PM      Failed - This refill cannot be delegated      Failed - Urine Drug Screen completed in last 360 days.      Passed - Valid encounter within last 6 months    Recent Outpatient Visits          2 months ago Chronic nonintractable headache, unspecified headache type   [place] McLean-Scocuzza, Nino Glow, MD   7 months ago [place] McLean-Scocuzza, Nino Glow, MD   9 months ago Insomnia, unspecified type   [place] McLean-Scocuzza, Nino Glow, MD   10 months ago Hyperlipidemia, unspecified hyperlipidemia type   [place] McLean-Scocuzza, Nino Glow, MD   1 year ago Injury of right wrist, initial encounter   [place], Barnie Del, [place]      Future Appointments            In 1 month Rubie Maid, MD [facility]

## 2017-12-15 NOTE — Telephone Encounter (Signed)
Please call pharmacy refill of ambien refilled 10/06/17 with 5 refills should not need refills from me for now  Inform pt   [place]

## 2017-12-31 ENCOUNTER — Other Ambulatory Visit: Payer: Self-pay | Admitting: *Deleted

## 2017-12-31 DIAGNOSIS — G43711 Chronic migraine without aura, intractable, with status migrainosus: Secondary | ICD-10-CM

## 2017-12-31 MED ORDER — PROPRANOLOL HCL ER 80 MG PO CP24: 80.0000 mg | ORAL_CAPSULE | Freq: Every day | ORAL | 1 refills | Status: DC

## 2018-02-09 ENCOUNTER — Telehealth: Payer: Self-pay | Admitting: Internal Medicine

## 2018-02-09 NOTE — Telephone Encounter (Signed)
Copied from [place] [id number]. Topic: Quick Communication - Rx Refill/Question >> Feb 09, 2018  2:15 PM [place], Sade R wrote: Medication: zolpidem (AMBIEN CR) 12.5 MG CR tablet Has the patient contacted their pharmacy? Yes Preferred Pharmacy (with phone number or street name): [id number] [place], [place] [phone] (Phone) [phone] (Fax)   Agent: Please be advised that RX refills may take up to 3 business days. We ask that you follow-up with your pharmacy.

## 2018-02-09 NOTE — Telephone Encounter (Signed)
Medication not delegated for NT to refill. 

## 2018-02-10 NOTE — Telephone Encounter (Signed)
Pt called to f/u on RX request since she is out of state with her mother right now (in [place]). Pt states she cannot "refill" what she has because it is a controlled and pharmacy in [facility] needs new Rx. Pt will run out Friday. She will be in [facility] for several months as her mother has cancer. Possibly add refills. Please call to advise.  [id number] [place], [place]        [phone] (Phone) [phone] (Fax)

## 2018-02-11 ENCOUNTER — Encounter: Payer: No Typology Code available for payment source | Admitting: Obstetrics and Gynecology

## 2018-02-11 ENCOUNTER — Other Ambulatory Visit: Payer: Self-pay | Admitting: Internal Medicine

## 2018-02-11 DIAGNOSIS — I10 Essential (primary) hypertension: Secondary | ICD-10-CM

## 2018-02-12 ENCOUNTER — Other Ambulatory Visit: Payer: Self-pay | Admitting: Internal Medicine

## 2018-02-12 DIAGNOSIS — I10 Essential (primary) hypertension: Secondary | ICD-10-CM

## 2018-02-12 DIAGNOSIS — G4700 Insomnia, unspecified: Secondary | ICD-10-CM

## 2018-02-12 MED ORDER — ZOLPIDEM TARTRATE ER 12.5 MG PO TBCR: 12.5000 mg | EXTENDED_RELEASE_TABLET | Freq: Every evening | ORAL | 2 refills | Status: DC | PRN

## 2018-02-12 MED ORDER — AMLODIPINE BESYLATE 2.5 MG PO TABS: 2.5000 mg | ORAL_TABLET | Freq: Every day | ORAL | 3 refills | Status: DC

## 2018-02-12 NOTE — Telephone Encounter (Signed)
Patient is calling to follow up on this as it is her last pill today.

## 2018-02-17 ENCOUNTER — Encounter: Payer: No Typology Code available for payment source | Admitting: Obstetrics and Gynecology

## 2018-03-10 ENCOUNTER — Encounter: Payer: Self-pay | Admitting: Obstetrics and Gynecology

## 2018-03-10 ENCOUNTER — Ambulatory Visit (INDEPENDENT_AMBULATORY_CARE_PROVIDER_SITE_OTHER): Payer: No Typology Code available for payment source | Admitting: Obstetrics and Gynecology

## 2018-03-10 VITALS — BP 136/96 | HR 77 | Ht 63.0 in | Wt 174.9 lb

## 2018-03-10 DIAGNOSIS — Z01419 Encounter for gynecological examination (general) (routine) without abnormal findings: Secondary | ICD-10-CM

## 2018-03-10 DIAGNOSIS — E785 Hyperlipidemia, unspecified: Secondary | ICD-10-CM | POA: Diagnosis not present

## 2018-03-10 DIAGNOSIS — Z9884 Bariatric surgery status: Secondary | ICD-10-CM

## 2018-03-10 DIAGNOSIS — Z803 Family history of malignant neoplasm of breast: Secondary | ICD-10-CM

## 2018-03-10 DIAGNOSIS — E119 Type 2 diabetes mellitus without complications: Secondary | ICD-10-CM

## 2018-03-10 DIAGNOSIS — I10 Essential (primary) hypertension: Secondary | ICD-10-CM

## 2018-03-10 DIAGNOSIS — Z79890 Hormone replacement therapy (postmenopausal): Secondary | ICD-10-CM

## 2018-03-10 NOTE — Progress Notes (Signed)
PT is present today for her annual exam. Pt stated that she has been doing self-breast exams monthly. Pt stated that she is doing well and denies any issues. No problems or concerns.     

## 2018-03-10 NOTE — Progress Notes (Signed)
ANNUAL PREVENTATIVE CARE GYNECOLOGY  ENCOUNTER NOTE  Subjective:       Kristin [name] is a 58 y.o. [id number] married Caucasian female here for a routine annual gynecologic exam.  She is transitioning care from Dr. Enzo Bi of [facility] due to his retirement. The patient is somewhat sexually active. The patient is currently taking hormone replacement therapy. Patient denies post-menopausal vaginal bleeding. The patient wears seatbelts: no. The patient participates in regular exercise: no. Has the patient ever been transfused or tattooed?: no. The patient reports that there is not domestic violence in her life.  Current complaints: 1.  Patient desires to discuss weaning from her HRT, in light of her mother's recurrence of breast cancer over this past year. Notes that her mother was ~ 7 years cancer free after initial diagnosis and treatment.    2. Recently seen by Dermatologist within the past week, had several skin lesions frozen along chest area. Has a prior history of basal cell carcinoma.    Gynecologic History No LMP recorded. Patient has had a hysterectomy. Contraception: status post hysterectomy Last Pap: prior to hysterectomy. Denies any h/o abnormal pap smears in the past.  Last mammogram: 05/11/2017. Results were: normal Last Colonoscopy: 07/20/2009. Results were normal.  Does yearly stool cards.  Last Dexa Scan: Patient has never had one.  PCP Dr. Marigene Ehlers at [facility]   Obstetric History OB History  Gravida Para Term Preterm AB Living  3 3 3     3   SAB TAB Ectopic Multiple Live Births          3    # Outcome Date GA Lbr Len/2nd Weight Sex Delivery Anes PTL Lv  3 Term 1995   7 lb (3.175 kg) M CS-LTranv   LIV  2 Term 1988   7 lb (3.175 kg) M Vag-Spont   LIV  1 Term 1985   7 lb (3.175 kg) M CS-LTranv   LIV    Past Medical History:  Diagnosis Date  . Allergy   . Arthritis   . Basal cell carcinoma    face, back, neck, arm  . Basal cell  carcinoma    nose, left cheek  . Chicken pox   . Complication of anesthesia    blood pressure elevated after gastric sleeve surgery in recovery, pt does not want mask over face  . Depression   . Diabetes mellitus without complication ([place])    pre-diabetic currently, was diabetic prior to gastric sleeve surgery   . Fatty liver   . GERD (gastroesophageal reflux disease)   . Gout   . History of shingles   . Hyperlipidemia   . Hypertension    no problems since losing weight after gastric sleeve surgery  . Insomnia   . Migraine   . Obesity   . Osteopenia     Family History  Problem Relation Age of Onset  . Heart disease Mother   . Breast cancer Mother 37       metastatic, now hip, spine, and leg   . Arthritis Mother   . Hyperlipidemia Mother   . Cancer Mother        spinal surgery  . Heart disease Father   . Lung cancer Father   . Hyperlipidemia Father   . Hypertension Father   . Diabetes Sister   . Diabetes Maternal Aunt   . Diabetes Maternal Uncle   . Diabetes Maternal Grandfather   . Hyperlipidemia Maternal Grandfather   . Arthritis  Maternal Grandmother   . Sudden death Cousin   . Diabetes Maternal Aunt   . Diabetes Sister   . Colon cancer Neg Hx   . Ovarian cancer Neg Hx     Past Surgical History:  Procedure Laterality Date  . ABDOMINAL HYSTERECTOMY    . ANKLE ARTHROSCOPY Left 05/29/2016   Procedure: ANKLE ARTHROSCOPY;  Surgeon: [name], DPM;  Location: [place];  Service: Podiatry;  Laterality: Left;  . APPENDECTOMY    . basal cell removed    . [place]  . CHOLECYSTECTOMY    . cyst removed from wrist    . FOOT SURGERY     left foot 11/2016 Dr. Laymond Purser   . left elbow surgery     07/2017 Dr Leanor Kail epicondylitis   . MOHS SURGERY     x 2 face nose and left cheek   . OSTEOCHONDROMA EXCISION Left 05/29/2016   Procedure: OSTEOCHONDRAL DRILLING TALUS;  Surgeon: [name], DPM;  Location: [place];  Service: Podiatry;   Laterality: Left;  . PLANTAR FASCIA RELEASE Left 05/29/2016   Procedure: ENDOSCOPIC PLANTAR FASCIOTOMY;  Surgeon: [name], DPM;  Location: [place];  Service: Podiatry;  Laterality: Left;  . ROTATOR CUFF REPAIR     lt shoulder  . STOMACH SURGERY  2017   gastric sleeve surgery  . TENNIS ELBOW RELEASE/NIRSCHEL PROCEDURE Left 08/05/2017   Procedure: TENNIS ELBOW RELEASE/NIRSCHEL PROCEDURE;  Surgeon: Leanor Kail, MD;  Location: [facility];  Service: Orthopedics;  Laterality: Left;  . TUBAL LIGATION    . wedge resection of ovary  1982    Social History   Socioeconomic History  . Marital status: Married    Spouse name: Not on file  . Number of children: 3  . Years of education: Not on file  . Highest education level: Some college, no degree  Occupational History  . Not on file  Social Needs  . Financial resource strain: Not on file  . Food insecurity:    Worry: Not on file    Inability: Not on file  . Transportation needs:    Medical: Not on file    Non-medical: Not on file  Tobacco Use  . Smoking status: Never Smoker  . Smokeless tobacco: Never Used  Substance and Sexual Activity  . Alcohol use: Never    Frequency: Never  . Drug use: Never  . Sexual activity: Yes    Partners: Male    Birth control/protection: Surgical  Lifestyle  . Physical activity:    Days per week: 0 days    Minutes per session: 0 min  . Stress: Not on file  Relationships  . Social connections:    Talks on phone: Not on file    Gets together: Not on file    Attends religious service: Not on file    Active member of club or organization: Not on file    Attends meetings of clubs or organizations: Not on file    Relationship status: Not on file  . Intimate partner violence:    Fear of current or ex partner: Not on file    Emotionally abused: Not on file    Physically abused: Not on file    Forced sexual activity: Not on file  Other Topics Concern  . Not on file  Social History Narrative    Married x 37 years as of 10/2017    Kids    Write childrens  books    Retiring from [employer] 01/2017    Enjoys traveling    Right handed   Caffeine: quit 2009    Current Outpatient Medications on File Prior to Visit  Medication Sig Dispense Refill  . amLODipine (NORVASC) 2.5 MG tablet Take 1 tablet (2.5 mg total) by mouth daily. No need to cut pill in 1/2 take whole pill 90 tablet 3  . estradiol (ESTRACE) 1 MG tablet TAKE 1 TABLET BY MOUTH EVERY DAY 90 tablet 3  . FLUoxetine (PROZAC) 20 MG capsule TAKE 1 CAPSULE BY MOUTH EVERY DAY 90 capsule 4  . Galcanezumab-gnlm (EMGALITY) 120 MG/ML SOAJ Inject 120 mg into the skin every 30 (thirty) days. 1 pen 11  . loratadine (CLARITIN) 10 MG tablet Take 10 mg by mouth daily as needed for allergies.    Marland Kitchen meclizine (ANTIVERT) 12.5 MG tablet Take 1 tablet (12.5 mg total) by mouth 2 (two) times daily as needed for dizziness. 30 tablet 2  . omeprazole (PRILOSEC) 20 MG capsule Take 20 mg by mouth daily as needed (indigestion).    . ondansetron (ZOFRAN-ODT) 4 MG disintegrating tablet Take 1 tablet (4 mg total) by mouth every 8 (eight) hours as needed for nausea. 30 tablet 3  . rizatriptan (MAXALT-MLT) 10 MG disintegrating tablet Take 1 tablet (10 mg total) by mouth as needed for migraine. May repeat in 2 hours if needed 10 tablet 11  . rosuvastatin (CRESTOR) 20 MG tablet Take 1 tablet (20 mg total) by mouth at bedtime. 90 tablet 3  . zolpidem (AMBIEN CR) 12.5 MG CR tablet Take 1 tablet (12.5 mg total) by mouth at bedtime as needed. 30 tablet 2  . propranolol ER (INDERAL LA) 80 MG 24 hr capsule Take 1 capsule (80 mg total) by mouth at bedtime. (Patient not taking: Reported on 03/10/2018) 90 capsule 1   No current facility-administered medications on file prior to visit.     Allergies  Allergen Reactions  . Tetracyclines & Related Swelling and Rash    FACE SWELL, RASH ON CHEST  . Alcohol Rash    ABDOMINAL CRAMPS, drinking alcohol  . Metoprolol Diarrhea       Review of Systems ROS Review of Systems - General ROS: negative for - chills, fatigue, fever, hot flashes, night sweats, weight gain or weight loss Psychological ROS: negative for - anxiety, decreased libido, depression, mood swings, physical abuse or sexual abuse Ophthalmic ROS: negative for - blurry vision, eye pain or loss of vision ENT ROS: negative for - headaches, hearing change, visual changes or vocal changes Allergy and Immunology ROS: negative for - hives, itchy/watery eyes or seasonal allergies Hematological and Lymphatic ROS: negative for - bleeding problems, bruising, swollen lymph nodes or weight loss Endocrine ROS: negative for - galactorrhea, hair pattern changes, hot flashes, malaise/lethargy, mood swings, palpitations, polydipsia/polyuria, skin changes, temperature intolerance or unexpected weight changes Breast ROS: negative for - new or changing breast lumps or nipple discharge Respiratory ROS: negative for - cough or shortness of breath Cardiovascular ROS: negative for - chest pain, irregular heartbeat, palpitations or shortness of breath Gastrointestinal ROS: no abdominal pain, change in bowel habits, or black or bloody stools Genito-Urinary ROS: no dysuria, trouble voiding, or hematuria Musculoskeletal ROS: negative for - joint pain or joint stiffness Neurological ROS: negative for - bowel and bladder control changes Dermatological ROS: negative for rash and skin lesion changes   Objective:   BP (!) 136/96   Pulse 77   Ht 5\' 3"  (1.6 m)  Wt 174 lb 14.4 oz (79.3 kg)   BMI 30.98 kg/m  CONSTITUTIONAL: Well-developed, well-nourished female in no acute distress. Mild obesity PSYCHIATRIC: Normal mood and affect. Normal behavior. Normal judgment and thought content. [place]: Alert and oriented to person, place, and time. Normal muscle tone coordination. No cranial nerve deficit noted. HENT:  Normocephalic, atraumatic, External right and left ear normal.  Oropharynx is clear and moist EYES: Conjunctivae and EOM are normal. Pupils are equal, round, and reactive to light. No scleral icterus.  NECK: Normal range of motion, supple, no masses.  Normal thyroid.  SKIN: Skin is warm and dry. No rash noted. Not diaphoretic. No erythema. No pallor. CARDIOVASCULAR: Normal heart rate noted, regular rhythm, no murmur. RESPIRATORY: Clear to auscultation bilaterally. Effort and breath sounds normal, no problems with respiration noted. BREASTS: Symmetric in size. No masses, skin changes, nipple drainage, or lymphadenopathy. ABDOMEN: Soft, normal bowel sounds, no distention noted.  No tenderness, rebound or guarding.  BLADDER: Normal PELVIC:  Bladder no bladder distension noted  Urethra: normal appearing urethra with no masses, tenderness or lesions  Vulva: normal appearing vulva with no masses, tenderness or lesions  Vagina: normal appearing vagina with normal color and discharge, no lesions  Cervix: surgically absent  Uterus: surgically absent, vaginal cuff well healed  Adnexa:  and normal adnexa in size, nontender and no masses  RV: External Exam NormaI, No Rectal Masses and Normal Sphincter tone  MUSCULOSKELETAL: Normal range of motion. No tenderness.  No cyanosis, clubbing, or edema.  2+ distal pulses. LYMPHATIC: No Axillary, Supraclavicular, or Inguinal Adenopathy.   Labs: Lab Results  Component Value Date   WBC 8.6 08/05/2017   HGB 13.6 08/05/2017   HCT 40.1 08/05/2017   MCV 87.4 08/05/2017   PLT 293 08/05/2017    Lab Results  Component Value Date   CREATININE 0.64 08/05/2017   BUN 16 08/05/2017   NA 139 08/05/2017   K 3.5 08/05/2017   CL 107 08/05/2017   CO2 26 08/05/2017    Lab Results  Component Value Date   ALT 13 (L) 02/01/2017   AST 22 02/01/2017   ALKPHOS 76 02/01/2017   BILITOT 0.4 02/01/2017    Lab Results  Component Value Date   CHOL 163 05/08/2017   HDL 74 05/08/2017   LDLCALC 61 05/08/2017   TRIG 138 05/08/2017    CHOLHDL 3.5 05/20/2016    Lab Results  Component Value Date   TSH 2.610 05/20/2016    Lab Results  Component Value Date   HGBA1C 6.4 10/06/2017     Assessment:   Annual gynecologic examination 58 y.o. Menopause on HRT Family history of breast cancer Obesity 1  Dyslipidemia Hypertension  Type II DM (diet controlled) History of gastric bypass  Plan:  - Pap: Not needed. S/p hysterectomy.  - Mammogram: Ordered - Stool Guaiac Testing:  Ordered - Labs: to be performed with PCP - Routine preventative health maintenance measures emphasized: Exercise/Diet/Weight control, Alcohol/Substance use risks and Stress Management.  Patient notes she is working on losing weight again. Has lost ~ 7 lbs thus far.  - Patient declines flu vaccine.  - DM, Dyslipidemia, and HTN managed by PCP.  Has gained good control since gastric bypass surgery, no longer on meds for DM.  - Family history of breast cancer in mom with recurrence, initial diagnosis at 70. Continue to encourage monthly breast exams and yearly mammograms.    - Menopause on HRT.  This woman has a 3 year history of HRT exposure  with new study data showing increased risk of thrombo-embolic events such as myocardial infarction stroke and breast cancer after 4 or more years exposure to combination products with estrogen and progesterone. She has concerns about continued use of hormonal replacement therapy, and understands the benefits as well as the risks discussed above. She  is advised that she may wish to discontinue the HRT at any time, and encouraged to discuss this with her other physicians also. Her personal risk factors have been reviewed carefully with her today.  Discussed decreasing to 1/2 tablet daily dosing, or 1 tablet every other day dosing, and then reducing this by half after several weeks.  - Declines flu vaccine.  Return to [place], MD  [facility]

## 2018-03-10 NOTE — Patient Instructions (Addendum)
Health Maintenance for Postmenopausal Women Menopause is a normal process in which your reproductive ability comes to an end. This process happens gradually over a span of months to years, usually between the ages of 62 and 89. Menopause is complete when you have missed 12 consecutive menstrual periods. It is important to talk with your health care provider about some of the most common conditions that affect postmenopausal women, such as heart disease, cancer, and bone loss (osteoporosis). Adopting a healthy lifestyle and getting preventive care can help to promote your health and wellness. Those actions can also lower your chances of developing some of these common conditions. What should I know about menopause? During menopause, you may experience a number of symptoms, such as:  Moderate-to-severe hot flashes.  Night sweats.  Decrease in sex drive.  Mood swings.  Headaches.  Tiredness.  Irritability.  Memory problems.  Insomnia. Choosing to treat or not to treat menopausal changes is an individual decision that you make with your health care provider. What should I know about hormone replacement therapy and supplements? Hormone therapy products are effective for treating symptoms that are associated with menopause, such as hot flashes and night sweats. Hormone replacement carries certain risks, especially as you become older. If you are thinking about using estrogen or estrogen with progestin treatments, discuss the benefits and risks with your health care provider. What should I know about heart disease and stroke? Heart disease, heart attack, and stroke become more likely as you age. This may be due, in part, to the hormonal changes that your body experiences during menopause. These can affect how your body processes dietary fats, triglycerides, and cholesterol. Heart attack and stroke are both medical emergencies. There are many things that you can do to help prevent heart disease  and stroke:  Have your blood pressure checked at least every 1-2 years. High blood pressure causes heart disease and increases the risk of stroke.  If you are 79-72 years old, ask your health care provider if you should take aspirin to prevent a heart attack or a stroke.  Do not use any tobacco products, including cigarettes, chewing tobacco, or electronic cigarettes. If you need help quitting, ask your health care provider.  It is important to eat a healthy diet and maintain a healthy weight. ? Be sure to include plenty of vegetables, fruits, low-fat dairy products, and lean protein. ? Avoid eating foods that are high in solid fats, added sugars, or salt (sodium).  Get regular exercise. This is one of the most important things that you can do for your health. ? Try to exercise for at least 150 minutes each week. The type of exercise that you do should increase your heart rate and make you sweat. This is known as moderate-intensity exercise. ? Try to do strengthening exercises at least twice each week. Do these in addition to the moderate-intensity exercise.  Know your numbers.Ask your health care provider to check your cholesterol and your blood glucose. Continue to have your blood tested as directed by your health care provider.  What should I know about cancer screening? There are several types of cancer. Take the following steps to reduce your risk and to catch any cancer development as early as possible. Breast Cancer  Practice breast self-awareness. ? This means understanding how your breasts normally appear and feel. ? It also means doing regular breast self-exams. Let your health care provider know about any changes, no matter how small.  If you are 40 or  older, have a clinician do a breast exam (clinical breast exam or CBE) every year. Depending on your age, family history, and medical history, it may be recommended that you also have a yearly breast X-ray (mammogram).  If you  have a family history of breast cancer, talk with your health care provider about genetic screening.  If you are at high risk for breast cancer, talk with your health care provider about having an MRI and a mammogram every year.  Breast cancer (BRCA) gene test is recommended for women who have family members with BRCA-related cancers. Results of the assessment will determine the need for genetic counseling and BRCA1 and for BRCA2 testing. BRCA-related cancers include these types: ? Breast. This occurs in males or females. ? Ovarian. ? Tubal. This may also be called fallopian tube cancer. ? Cancer of the abdominal or pelvic lining (peritoneal cancer). ? Prostate. ? Pancreatic. Cervical, Uterine, and Ovarian Cancer Your health care provider may recommend that you be screened regularly for cancer of the pelvic organs. These include your ovaries, uterus, and vagina. This screening involves a pelvic exam, which includes checking for microscopic changes to the surface of your cervix (Pap test).  For women ages 21-65, health care providers may recommend a pelvic exam and a Pap test every three years. For women ages 39-65, they may recommend the Pap test and pelvic exam, combined with testing for human papilloma virus (HPV), every five years. Some types of HPV increase your risk of cervical cancer. Testing for HPV may also be done on women of any age who have unclear Pap test results.  Other health care providers may not recommend any screening for nonpregnant women who are considered low risk for pelvic cancer and have no symptoms. Ask your health care provider if a screening pelvic exam is right for you.  If you have had past treatment for cervical cancer or a condition that could lead to cancer, you need Pap tests and screening for cancer for at least 20 years after your treatment. If Pap tests have been discontinued for you, your risk factors (such as having a new sexual partner) need to be reassessed  to determine if you should start having screenings again. Some women have medical problems that increase the chance of getting cervical cancer. In these cases, your health care provider may recommend that you have screening and Pap tests more often.  If you have a family history of uterine cancer or ovarian cancer, talk with your health care provider about genetic screening.  If you have vaginal bleeding after reaching menopause, tell your health care provider.  There are currently no reliable tests available to screen for ovarian cancer. Lung Cancer Lung cancer screening is recommended for adults 57-50 years old who are at high risk for lung cancer because of a history of smoking. A yearly low-dose CT scan of the lungs is recommended if you:  Currently smoke.  Have a history of at least 30 pack-years of smoking and you currently smoke or have quit within the past 15 years. A pack-year is smoking an average of one pack of cigarettes per day for one year. Yearly screening should:  Continue until it has been 15 years since you quit.  Stop if you develop a health problem that would prevent you from having lung cancer treatment. Colorectal Cancer  This type of cancer can be detected and can often be prevented.  Routine colorectal cancer screening usually begins at age 12 and continues through  age 75.  If you have risk factors for colon cancer, your health care provider may recommend that you be screened at an earlier age.  If you have a family history of colorectal cancer, talk with your health care provider about genetic screening.  Your health care provider may also recommend using home test kits to check for hidden blood in your stool.  A small camera at the end of a tube can be used to examine your colon directly (sigmoidoscopy or colonoscopy). This is done to check for the earliest forms of colorectal cancer.  Direct examination of the colon should be repeated every 5-10 years until  age 75. However, if early forms of precancerous polyps or small growths are found or if you have a family history or genetic risk for colorectal cancer, you may need to be screened more often. Skin Cancer  Check your skin from head to toe regularly.  Monitor any moles. Be sure to tell your health care provider: ? About any new moles or changes in moles, especially if there is a change in a mole's shape or color. ? If you have a mole that is larger than the size of a pencil eraser.  If any of your family members has a history of skin cancer, especially at a young age, talk with your health care provider about genetic screening.  Always use sunscreen. Apply sunscreen liberally and repeatedly throughout the day.  Whenever you are outside, protect yourself by wearing long sleeves, pants, a wide-brimmed hat, and sunglasses. What should I know about osteoporosis? Osteoporosis is a condition in which bone destruction happens more quickly than new bone creation. After menopause, you may be at an increased risk for osteoporosis. To help prevent osteoporosis or the bone fractures that can happen because of osteoporosis, the following is recommended:  If you are 19-50 years old, get at least 1,000 mg of calcium and at least 600 mg of vitamin D per day.  If you are older than age 50 but younger than age 70, get at least 1,200 mg of calcium and at least 600 mg of vitamin D per day.  If you are older than age 70, get at least 1,200 mg of calcium and at least 800 mg of vitamin D per day. Smoking and excessive alcohol intake increase the risk of osteoporosis. Eat foods that are rich in calcium and vitamin D, and do weight-bearing exercises several times each week as directed by your health care provider. What should I know about how menopause affects my mental health? Depression may occur at any age, but it is more common as you become older. Common symptoms of depression include:  Low or sad  mood.  Changes in sleep patterns.  Changes in appetite or eating patterns.  Feeling an overall lack of motivation or enjoyment of activities that you previously enjoyed.  Frequent crying spells. Talk with your health care provider if you think that you are experiencing depression. What should I know about immunizations? It is important that you get and maintain your immunizations. These include:  Tetanus, diphtheria, and pertussis (Tdap) booster vaccine.  Influenza every year before the flu season begins.  Pneumonia vaccine.  Shingles vaccine. Your health care provider may also recommend other immunizations. This information is not intended to replace advice given to you by your health care provider. Make sure you discuss any questions you have with your health care provider. Document Released: 02/28/2005 Document Revised: 07/27/2015 Document Reviewed: 10/10/2014 Elsevier Interactive Patient Education    2019 [employer]  Breast Self-Awareness Breast self-awareness means:  Knowing how your breasts look.  Knowing how your breasts feel.  Checking your breasts every month for changes.  Telling your doctor if you notice a change in your breasts. Breast self-awareness allows you to notice a breast problem early while it is still small. How to do a breast self-exam One way to learn what is normal for your breasts and to check for changes is to do a breast self-exam. To do a breast self-exam: Look for Changes  1. Take off all the clothes above your waist. 2. Stand in front of a mirror in a room with good lighting. 3. Put your hands on your hips. 4. Push your hands down. 5. Look at your breasts and nipples in the mirror to see if one breast or nipple looks different than the other. Check to see if: ? The shape of one breast is different. ? The size of one breast is different. ? There are wrinkles, dips, and bumps in one breast and not the other. 6. Look at each breast for  changes in your skin, such as: ? Redness. ? Scaly areas. 7. Look for changes in your nipples, such as: ? Liquid around the nipples. ? Bleeding. ? Dimpling. ? Redness. ? A change in where the nipples are. Feel for Changes 1. Lie on your back on the floor. 2. Feel each breast. To do this, follow these steps: ? Pick a breast to feel. ? Put the arm closest to that breast above your head. ? Use your other arm to feel the nipple area of your breast. Feel the area with the pads of your three middle fingers by making small circles with your fingers. For the first circle, press lightly. For the second circle, press harder. For the third circle, press even harder. ? Keep making circles with your fingers at the light, harder, and even harder pressures as you move down your breast. Stop when you feel your ribs. ? Move your fingers a little toward the center of your body. ? Start making circles with your fingers again, this time going up until you reach your collarbone. ? Keep making up and down circles until you reach your armpit. Remember to keep using the three pressures. ? Feel the other breast in the same way. 3. Sit or stand in the shower or tub. 4. With soapy water on your skin, feel each breast the same way you did in step 2, when you were lying on the floor. Write Down What You Find After doing the self-exam, write down:  What is normal for each breast.  Any changes you find in each breast.  When you last had your period.  How often should I check my breasts? Check your breasts every month. If you are breastfeeding, the best time to check them is after you feed your baby or after you use a breast pump. If you get periods, the best time to check your breasts is 5-7 days after your period is over. When should I see my doctor? See your doctor if you notice:  A change in shape or size of your breasts or nipples.  A change in the skin of your breast or nipples, such as red or scaly  skin.  Unusual fluid coming from your nipples.  A lump or thick area that was not there before.  Pain in your breasts.  Anything that concerns you. This information is not intended to replace advice   given to you by your health care provider. Make sure you discuss any questions you have with your health care provider. Document Released: 06/25/2007 Document Revised: 06/14/2015 Document Reviewed: 11/26/2014 Elsevier Interactive Patient Education  2019 Elsevier Inc.  

## 2018-04-01 ENCOUNTER — Encounter: Payer: Self-pay | Admitting: Internal Medicine

## 2018-04-05 ENCOUNTER — Telehealth: Payer: Self-pay

## 2018-04-05 NOTE — Telephone Encounter (Signed)
Copied from [place] [id number]. Topic: Referral - Request for Referral >> Apr 05, 2018 10:54 AM Oneta Rack wrote: Has patient seen PCP for this complaint? Yes   Referral for which specialty:  rheumatologist Preferred provider/office: [facility]  Reason for referral: Pain in both hands and left foot

## 2018-04-12 ENCOUNTER — Other Ambulatory Visit: Payer: Self-pay | Admitting: Internal Medicine

## 2018-04-12 DIAGNOSIS — M2550 Pain in unspecified joint: Secondary | ICD-10-CM

## 2018-04-12 NOTE — Telephone Encounter (Signed)
Sent referral to [facility] ortho and rheumatology since you are established with ortho it may take less time to get appt  For rheumatology I prefer Dr. Amil Amen in [place] sent referral there   Inform pt   Kristin [name]

## 2018-04-12 NOTE — Telephone Encounter (Signed)
Patient has been informed.

## 2018-04-26 ENCOUNTER — Other Ambulatory Visit: Payer: Self-pay | Admitting: Obstetrics and Gynecology

## 2018-04-26 NOTE — Telephone Encounter (Signed)
Please advise on prescription. Last time she was seen in 02/2018 she had BV.

## 2018-04-26 NOTE — Telephone Encounter (Signed)
The patient called and stated that she has a yeast infection and that she is hoping that a prescription can be set into her pharmacy today is possible. Please advise.

## 2018-04-27 MED ORDER — FLUCONAZOLE 150 MG PO TABS: 150.0000 mg | ORAL_TABLET | Freq: Once | ORAL | 0 refills | Status: AC

## 2018-04-27 NOTE — Telephone Encounter (Signed)
Talk with her.  If it sound like yeast then 1 Diflucan Rx call in.  If like BV then Flagyl 500mg  [id number]  Thanks

## 2018-04-27 NOTE — Telephone Encounter (Signed)
Pt called and spoke with her about her issues to see if it was BV or a yeast infection. Pt stated that it was an yeast infection. Sent in [place] for the pt to take and advised her if it was not cleared up in 7 days to please call the office. Pt voiced that she understood.

## 2018-05-04 ENCOUNTER — Telehealth: Payer: Self-pay | Admitting: Family Medicine

## 2018-05-04 NOTE — Telephone Encounter (Signed)
Placed appt 05-06-18 at 1130.

## 2018-05-04 NOTE — Telephone Encounter (Signed)
Ok per Dr. Jaynee Eagles to change to Amy NP. I called and spoke with the patient regarding changing their apt to a VV. I explained to the patient that we would file their insurance and they gave consent. I also walked through the steps of downloading the app, and starting the virtual meeting, after confirming the patient had access to a smart phone with a working camera and microphone access.

## 2018-05-05 NOTE — Telephone Encounter (Signed)
Updated chart.

## 2018-05-06 ENCOUNTER — Ambulatory Visit (INDEPENDENT_AMBULATORY_CARE_PROVIDER_SITE_OTHER): Payer: No Typology Code available for payment source | Admitting: Family Medicine

## 2018-05-06 ENCOUNTER — Other Ambulatory Visit: Payer: Self-pay

## 2018-05-06 ENCOUNTER — Encounter: Payer: Self-pay | Admitting: Family Medicine

## 2018-05-06 DIAGNOSIS — G43711 Chronic migraine without aura, intractable, with status migrainosus: Secondary | ICD-10-CM

## 2018-05-06 MED ORDER — FLUOXETINE HCL 40 MG PO CAPS: 40.0000 mg | ORAL_CAPSULE | Freq: Every day | ORAL | 3 refills | Status: DC

## 2018-05-06 NOTE — Progress Notes (Signed)
PATIENT: Kristin [name] DOB: April 04, 1960  REASON FOR VISIT: follow up HISTORY FROM: patient  Virtual Visit via Telephone Note  I connected with Kristin [name] on 05/06/18 at 11:30 AM EDT by telephone and verified that I am speaking with the correct person using two identifiers.   I discussed the limitations, risks, security and privacy concerns of performing an evaluation and management service by telephone and the availability of in person appointments. I also discussed with the patient that there may be a patient responsible charge related to this service. The patient expressed understanding and agreed to proceed.   History of Present Illness:  05/06/18 Kristin [name] is a 58 y.o. female for follow up of migraines. She was started on Emgality and Prozac at her previous visit in 10/2017. She was also taking propranolol at that time but has discontinued this medication due to excessive fatigue and drowsiness.  She has been using rizatriptan and Zofran for abortive therapy.  She reports that she is having 2-3 migraines per month.  She does feel that her medications are helping significantly but is interested in increasing the Prozac dose.  She feels that this helped tremendously with her headaches and stress levels.  She does admit to continued stress and depression.  She is trying to care for her husband and son at home while also caring for her mother who has cancer in [place].  She is splitting her time between the 2 homes.  She denies any concerns of suicidal ideations.  History (copied from Dr Cathren Laine note 11/10/2017)  Interval history 11/10/2017: MR of the brain showed frontal chronic sinusitis, discussed seeing ENT. Propranolol is helping, the headaches are not as bad. She has been tested in the past recently 3 years ago and no sleep apnea.  She is under more stress now. Her pulse is 60 cannot increase propranolol further. Discussed options, due to stress may start  Prozac which is a migraine preventative and can help with stress. Zofran for dizziness or nausea. Also start Emgality.   Medications tried: propranolol, topiramate, prozac, nortriptyline, verapamil  Reviewed MRI of the brain images with patient and agree with th following:  This MRI of the brain with and without contrast shows the following: 1. Couple punctate T2/FLAIR hyperintense foci in the subcortical white matter. These are also seen on the 2012 MRI. They are nonspecific but could represent very minimal chronic microvascular ischemic changes or sequela of migraine headaches. 2. Chronic right frontal sinusitis. 3. There are no acute findings and there is a normal enhancement pattern.  HPI:  Kristin [name] is a 58 y.o. female here as requested by Dr. Terese Door for headaches. Migraines in the past was always on the left hand side. She was on Topamax 10 years ago she stopped headaches improved after coming off of caffeine and a massage once a month. She would have occ headaches but not severe not frequent. She started noticing headaches in may and became more severe in September every time she stood up out of the car the headache would be severe, noticed a pattern of worsening headache when standing. No lumbar punctures, no trauma, no new medications, no recent illnesses. She hydrates well. No OTC medication overuse. Mid September the afternoon worsened but lost the positional quality. Now every afternoon she has a headache. Headache is on the top of the head, tightness, worse if she leans over from the throbbing. No light sensitivity, + sound sensitivity. Can wake up with the headaches. Tylenol  helps a little bit. Sleeping helps. No other focal neurologic deficits, associated symptoms, inciting events or modifiable factors.  Reviewed notes, labs and imaging from outside physicians, which showed:  Reviewed MRI brain images from 2012 which were normal  Esr/crp  nml   Observations/Objective:  Generalized: Well developed, in no acute distress  Mentation: Alert oriented to time, place, history taking. Follows all commands speech and language fluent   Assessment and Plan:  58 y.o. year old female  has a past medical history of Allergy, Arthritis, Basal cell carcinoma, Basal cell carcinoma, Chicken pox, Complication of anesthesia, Depression, Diabetes mellitus without complication ([place]), Fatty liver, GERD (gastroesophageal reflux disease), Gout, History of shingles, Hyperlipidemia, Hypertension, Insomnia, Migraine, Obesity, and Osteopenia. here with    ICD-10-CM   1. Chronic migraine without aura, with intractable migraine, so stated, with status migrainosus G43.711 FLUoxetine (PROZAC) 40 MG capsule   Overall Kristin [name] is doing very well with current therapy of Emgality and Prozac for prevention of migraines as well as rizatriptan and Zofran for abortive therapy.  She is requesting to increase her Prozac dose for both migraine prevention and concerns of stress and depression.  I feel this is reasonable and will increase Prozac dose to 40 mg daily.  I have discussed dose changes with Kristin [name] and she verbalizes understanding.  New prescription will be called into her pharmacy.  We will continue Emgality as well as abortive therapy with rizatriptan and Zofran.  I have discontinued propranolol as she did not tolerate this well.  She was encouraged to exercise daily as tolerated as well as spend some time outside in the sun.  Sunscreen recommended.  She verbalizes understanding and agreement with this plan.  We will follow-up with her in the office in 6 months.  No orders of the defined types were placed in this encounter.   Meds ordered this encounter  Medications   FLUoxetine (PROZAC) 40 MG capsule    Sig: Take 1 capsule (40 mg total) by mouth daily.    Dispense:  90 capsule    Refill:  3    Order Specific Question:   Supervising Provider    Answer:    Melvenia Beam [id number]     Follow Up Instructions:  I discussed the assessment and treatment plan with the patient. The patient was provided an opportunity to ask questions and all were answered. The patient agreed with the plan and demonstrated an understanding of the instructions.   The patient was advised to call back or seek an in-person evaluation if the symptoms worsen or if the condition fails to improve as anticipated.  I provided 25 minutes of non-face-to-face time during this encounter.  Patient is located at her place of residence during video conference.  Provider is located at her place of residence.  Kristin Comber, RN helped to facilitate visit.   Debbora Presto, NP

## 2018-05-09 NOTE — Progress Notes (Signed)
Made any corrections needed, and agree with history, physical, neuro exam,assessment and plan as stated.     Antonia Ahern, MD [facility]  

## 2018-05-11 ENCOUNTER — Ambulatory Visit: Payer: No Typology Code available for payment source

## 2018-05-11 ENCOUNTER — Other Ambulatory Visit: Payer: Self-pay | Admitting: Internal Medicine

## 2018-05-11 DIAGNOSIS — G4700 Insomnia, unspecified: Secondary | ICD-10-CM

## 2018-05-11 MED ORDER — ZOLPIDEM TARTRATE ER 12.5 MG PO TBCR: 12.5000 mg | EXTENDED_RELEASE_TABLET | Freq: Every evening | ORAL | 2 refills | Status: DC | PRN

## 2018-05-12 ENCOUNTER — Ambulatory Visit: Payer: No Typology Code available for payment source | Admitting: Neurology

## 2018-07-06 ENCOUNTER — Other Ambulatory Visit: Payer: Self-pay | Admitting: Internal Medicine

## 2018-07-06 DIAGNOSIS — E785 Hyperlipidemia, unspecified: Secondary | ICD-10-CM

## 2018-07-06 MED ORDER — ROSUVASTATIN CALCIUM 20 MG PO TABS: 20.0000 mg | ORAL_TABLET | Freq: Every day | ORAL | 3 refills | Status: DC

## 2018-07-12 ENCOUNTER — Other Ambulatory Visit: Payer: Self-pay

## 2018-07-12 ENCOUNTER — Ambulatory Visit
Admission: RE | Admit: 2018-07-12 | Discharge: 2018-07-12 | Disposition: A | Discharge status: Home / Self Care | Payer: No Typology Code available for payment source | Source: Ambulatory Visit | Attending: Obstetrics and Gynecology | Admitting: Obstetrics and Gynecology

## 2018-07-12 ENCOUNTER — Telehealth: Payer: Self-pay | Admitting: Obstetrics and Gynecology

## 2018-07-12 DIAGNOSIS — Z01419 Encounter for gynecological examination (general) (routine) without abnormal findings: Secondary | ICD-10-CM | POA: Insufficient documentation

## 2018-07-12 DIAGNOSIS — Z1231 Encounter for screening mammogram for malignant neoplasm of breast: Secondary | ICD-10-CM

## 2018-07-12 NOTE — Telephone Encounter (Signed)
Patientt needs a diagnostic mammogram order put in for [place]. She informed [place] that she has a new lump in the left breast. Thanks

## 2018-07-13 ENCOUNTER — Encounter: Payer: Self-pay | Admitting: Internal Medicine

## 2018-07-13 ENCOUNTER — Ambulatory Visit (INDEPENDENT_AMBULATORY_CARE_PROVIDER_SITE_OTHER): Payer: No Typology Code available for payment source | Admitting: Internal Medicine

## 2018-07-13 DIAGNOSIS — E559 Vitamin D deficiency, unspecified: Secondary | ICD-10-CM

## 2018-07-13 DIAGNOSIS — N644 Mastodynia: Secondary | ICD-10-CM

## 2018-07-13 DIAGNOSIS — H9202 Otalgia, left ear: Secondary | ICD-10-CM

## 2018-07-13 DIAGNOSIS — M79622 Pain in left upper arm: Secondary | ICD-10-CM | POA: Diagnosis not present

## 2018-07-13 DIAGNOSIS — Z1329 Encounter for screening for other suspected endocrine disorder: Secondary | ICD-10-CM

## 2018-07-13 DIAGNOSIS — Z1389 Encounter for screening for other disorder: Secondary | ICD-10-CM

## 2018-07-13 DIAGNOSIS — I10 Essential (primary) hypertension: Secondary | ICD-10-CM | POA: Diagnosis not present

## 2018-07-13 DIAGNOSIS — R7303 Prediabetes: Secondary | ICD-10-CM

## 2018-07-13 DIAGNOSIS — Z13220 Encounter for screening for lipoid disorders: Secondary | ICD-10-CM

## 2018-07-13 MED ORDER — AMOXICILLIN-POT CLAVULANATE 875-125 MG PO TABS: 1.0000 | ORAL_TABLET | Freq: Two times a day (BID) | ORAL | 0 refills | Status: DC

## 2018-07-13 MED ORDER — NEOMYCIN-POLYMYXIN-HC 3.5-10000-1 OT SOLN: 4.0000 [drp] | Freq: Four times a day (QID) | OTIC | 0 refills | Status: DC

## 2018-07-13 MED ORDER — FLUCONAZOLE 150 MG PO TABS: 150.0000 mg | ORAL_TABLET | Freq: Once | ORAL | 0 refills | Status: AC

## 2018-07-13 NOTE — Progress Notes (Signed)
Virtual Visit via Video Note  I connected with Kristin [name]  on 07/13/18 at  3:30 PM EDT by a video enabled telemedicine application and verified that I am speaking with the correct person using two identifiers.  Location patient: home Location provider:work  Persons participating in the virtual visit: patient, provider  I discussed the limitations of evaluation and management by telemedicine and the availability of in person appointments. The patient expressed understanding and agreed to proceed.   HPI: 1. C/o left ear pain w/o drainage since feb 2020 no ear popping she thought was her allergies tried claritin and otc ear drops for pain w/o relief. Behind her left ear she feels a knot and also painful. She has not been swimming. Pain is 6/10 worse at night   2. C/l left outer breast 2-3 oclock c/o soreness new FH breast cancer in her mother in 39s dx'ed breast cancer now at 19 metastatic breast cancer. She also c/o left underarm knot as well x 1 week  She reports a h/o breast calcifications as well and mammogram following   ROS: See pertinent positives and negatives per HPI.  Past Medical History:  Diagnosis Date  . Allergy   . Arthritis   . Basal cell carcinoma    face, back, neck, arm  . Basal cell carcinoma    nose, left cheek  . Chicken pox   . Complication of anesthesia    blood pressure elevated after gastric sleeve surgery in recovery, pt does not want mask over face  . Depression   . Diabetes mellitus without complication ([place])    pre-diabetic currently, was diabetic prior to gastric sleeve surgery   . Fatty liver   . GERD (gastroesophageal reflux disease)   . Gout   . History of shingles   . Hyperlipidemia   . Hypertension    no problems since losing weight after gastric sleeve surgery  . Insomnia   . Migraine   . Obesity   . Osteopenia     Past Surgical History:  Procedure Laterality Date  . ABDOMINAL HYSTERECTOMY    . ANKLE ARTHROSCOPY Left 05/29/2016    Procedure: ANKLE ARTHROSCOPY;  Surgeon: [name], DPM;  Location: [place];  Service: Podiatry;  Laterality: Left;  . APPENDECTOMY    . basal cell removed    . [place]  . CHOLECYSTECTOMY    . cyst removed from wrist    . FOOT SURGERY     left foot 11/2016 Dr. Laymond Purser   . left elbow surgery     07/2017 Dr Leanor Kail epicondylitis   . MOHS SURGERY     x 2 face nose and left cheek   . OSTEOCHONDROMA EXCISION Left 05/29/2016   Procedure: OSTEOCHONDRAL DRILLING TALUS;  Surgeon: [name], DPM;  Location: [place];  Service: Podiatry;  Laterality: Left;  . PLANTAR FASCIA RELEASE Left 05/29/2016   Procedure: ENDOSCOPIC PLANTAR FASCIOTOMY;  Surgeon: [name], DPM;  Location: [facility];  Service: Podiatry;  Laterality: Left;  . ROTATOR CUFF REPAIR     lt shoulder  . STOMACH SURGERY  2017   gastric sleeve surgery  . TENNIS ELBOW RELEASE/NIRSCHEL PROCEDURE Left 08/05/2017   Procedure: TENNIS ELBOW RELEASE/NIRSCHEL PROCEDURE;  Surgeon: Leanor Kail, MD;  Location: [facility];  Service: Orthopedics;  Laterality: Left;  . TUBAL LIGATION    . wedge resection of ovary  1982    Family History  Problem Relation Age  of Onset  . Heart disease Mother   . Breast cancer Mother 23       metastatic, now hip, spine, and leg   . Arthritis Mother   . Hyperlipidemia Mother   . Cancer Mother        spinal surgery. breast dx'ed age 16 now metastatic 61 as of 07/13/18  . Cystic fibrosis Mother        carrier   . Heart disease Father   . Lung cancer Father   . Hyperlipidemia Father   . Hypertension Father   . Diabetes Sister   . Diabetes Maternal Aunt   . Diabetes Maternal Uncle   . Diabetes Maternal Grandfather   . Hyperlipidemia Maternal Grandfather   . Arthritis Maternal Grandmother   . Sudden death Cousin   . Diabetes Maternal Aunt   . Diabetes Sister   . Cystic fibrosis Other        great niece   . Colon cancer Neg Hx   . Ovarian cancer Neg Hx      SOCIAL HX: lives at home with husband and son    Current Outpatient Medications:  .  celecoxib (CELEBREX) 100 MG capsule, Take by mouth., Disp: , Rfl:  .  amLODipine (NORVASC) 2.5 MG tablet, Take 1 tablet (2.5 mg total) by mouth daily. No need to cut pill in 1/2 take whole pill, Disp: 90 tablet, Rfl: 3 .  amoxicillin-clavulanate (AUGMENTIN) 875-125 MG tablet, Take 1 tablet by mouth 2 (two) times daily. With food x 7-10 days, Disp: 20 tablet, Rfl: 0 .  estradiol (ESTRACE) 1 MG tablet, TAKE 1 TABLET BY MOUTH EVERY DAY, Disp: 90 tablet, Rfl: 3 .  fluconazole (DIFLUCAN) 150 MG tablet, Take 1 tablet (150 mg total) by mouth once for 1 dose., Disp: 1 tablet, Rfl: 0 .  FLUoxetine (PROZAC) 40 MG capsule, Take 1 capsule (40 mg total) by mouth daily., Disp: 90 capsule, Rfl: 3 .  Galcanezumab-gnlm (EMGALITY) 120 MG/ML SOAJ, Inject 120 mg into the skin every 30 (thirty) days., Disp: 1 pen, Rfl: 11 .  loratadine (CLARITIN) 10 MG tablet, Take 10 mg by mouth daily as needed for allergies., Disp: , Rfl:  .  neomycin-polymyxin-hydrocortisone (CORTISPORIN) OTIC solution, Place 4 drops into the left ear 4 (four) times daily. X 7 days, Disp: 10 mL, Rfl: 0 .  omeprazole (PRILOSEC) 20 MG capsule, Take 20 mg by mouth daily as needed (indigestion)., Disp: , Rfl:  .  ondansetron (ZOFRAN-ODT) 4 MG disintegrating tablet, Take 1 tablet (4 mg total) by mouth every 8 (eight) hours as needed for nausea., Disp: 30 tablet, Rfl: 3 .  rizatriptan (MAXALT-MLT) 10 MG disintegrating tablet, Take 1 tablet (10 mg total) by mouth as needed for migraine. May repeat in 2 hours if needed, Disp: 10 tablet, Rfl: 11 .  rosuvastatin (CRESTOR) 20 MG tablet, Take 1 tablet (20 mg total) by mouth at bedtime., Disp: 90 tablet, Rfl: 3 .  zolpidem (AMBIEN CR) 12.5 MG CR tablet, Take 1 tablet (12.5 mg total) by mouth at bedtime as needed., Disp: 30 tablet, Rfl: 2  EXAM:  VITALS per patient if applicable:  GENERAL: alert, oriented, appears well  and in no acute distress  HEENT: atraumatic, conjunttiva clear, no obvious abnormalities on inspection of external nose and ears  NECK: normal movements of the head and neck  LUNGS: on inspection no signs of respiratory distress, breathing rate appears normal, no obvious gross SOB, gasping or wheezing  CV: no obvious cyanosis  MS:  moves all visible extremities without noticeable abnormality  PSYCH/NEURO: pleasant and cooperative, no obvious depression or anxiety, speech and thought processing grossly intact  ASSESSMENT AND PLAN:  Discussed the following assessment and plan:  Left ear pain - Plan: amoxicillin-clavulanate (AUGMENTIN) 875-125 MG tablet bid 7-10 days, neomycin-polymyxin-hydrocortisone (CORTISPORIN) OTIC solution leftear, fluconazole (DIFLUCAN) 150 MG tablet -if not better call back   Breast pain, left - Plan: MM DIAG BREAST TOMO BILATERAL, US BREAST LTD UNI LEFT INC AXILLA  Left axillary pain - Plan: MM DIAG BREAST TOMO BILATERAL, US BREAST LTD UNI LEFT INC AXILLA  HM Ordered fasting labs at [facility] Flu shot utd  utd Tdap  Disc shingrixprev hep A vaccine1/2 2nd dose due in 11/05/17-05/07/2018, hep B immunewith h/o fatty liver  Hep C neg MMR immune A1C check 6.4   Never smoker, chew congratulatednot doing either mammoneg 4/22/19ordered mammogram  Colonoscopy -obtained records 07/15/11 colonoscopy Dr. Candace Cruise nl repeat in 10 years  Pap s/p hysterectomy ? Cervix intact has appt Dr. Enzo Bi 02/10/17.-Appt went well1/2019 ob/gynf/u in 1 year  -per note pap not needed  Dermatology sees [facility] q6 months h/o BCC nose and left cheek s/p Mohs and h/o Aks  Of not she saw [facility] late March 2019 and had eye glasses checked    I discussed the assessment and treatment plan with the patient. The patient was provided an opportunity to ask questions and all were answered. The patient agreed with the plan and demonstrated an understanding of the  instructions.   The patient was advised to call back or seek an in-person evaluation if the symptoms worsen or if the condition fails to improve as anticipated.  Time spent 25 minutes  Delorise Jackson, MD

## 2018-07-13 NOTE — Patient Instructions (Signed)
Breast Tenderness Breast tenderness is a common problem for women of all ages. Breast tenderness may cause mild discomfort to severe pain. The pain usually comes and goes in association with your menstrual cycle, but it can be constant. Breast tenderness has many possible causes, including hormone changes and some medicines. Your health care provider may order tests, such as a mammogram or an ultrasound, to check for any unusual findings. Having breast tenderness usually does not mean that you have breast cancer. Follow these instructions at home: Sometimes, reassurance that you do not have breast cancer is all that is needed. In general, follow these home care instructions: Managing pain and discomfort   If directed, apply ice to the area: ? Put ice in a plastic bag. ? Place a towel between your skin and the bag. ? Leave the ice on for 20 minutes, 2-3 times a day.  Make sure you are wearing a supportive bra, especially during exercise. You may also want to wear a supportive bra while sleeping if your breasts are very tender. Medicines  Take over-the-counter and prescription medicines only as told by your health care provider. If the cause of your pain is infection, you may be prescribed an antibiotic medicine.  If you were prescribed an antibiotic, take it as told by your health care provider. Do not stop taking the antibiotic even if you start to feel better. General instructions   Your health care provider may recommend that you reduce the amount of fat in your diet. You can do this by: ? Limiting fried foods. ? Cooking foods using methods, such as baking, boiling, grilling, and broiling.  Decrease the amount of caffeine in your diet. You can do this by drinking more water and choosing caffeine-free options.  Keep a log of the days and times when your breasts are most tender.  Ask your health care provider how to do breast exams at home. This will help you notice if you have an  unusual growth or lump. Contact a health care provider if:  Any part of your breast is hard, red, and hot to the touch. This may be a sign of infection.  You are not breastfeeding and you have fluid, especially blood or pus, coming out of your nipples.  You have a fever.  You have a new or painful lump in your breast that remains after your menstrual period ends.  Your pain does not improve or it gets worse.  Your pain is interfering with your daily activities. This information is not intended to replace advice given to you by your health care provider. Make sure you discuss any questions you have with your health care provider. Document Released: 12/20/2007 Document Revised: 10/05/2015 Document Reviewed: 10/05/2015 Elsevier Interactive Patient Education  2019 [place], Adult An earache, or ear pain, can be caused by many things, including:  An infection.  Ear wax buildup.  Ear pressure.  Something in the ear that should not be there (foreign body).  A sore throat.  Tooth problems.  Jaw problems. Treatment of the earache will depend on the cause. If the cause is not clear or cannot be determined, you may need to watch your symptoms until your earache goes away or until a cause is found. Follow these instructions at home: Pay attention to any changes in your symptoms. Take these actions to help with your pain:  Take or apply over-the-counter and prescription medicines only as told by your health care provider.  If you  were prescribed an antibiotic medicine, use it as told by your health care provider. Do not stop using the antibiotic even if you start to feel better.  Do not put anything in your ear other than medicine that is prescribed by your health care provider.  If directed, apply heat to the affected area as often as told by your health care provider. Use the heat source that your health care provider recommends, such as a moist heat pack or a heating  pad. ? Place a towel between your skin and the heat source. ? Leave the heat on for 20-30 minutes. ? Remove the heat if your skin turns bright red. This is especially important if you are unable to feel pain, heat, or cold. You may have a greater risk of getting burned.  If directed, put ice on the ear: ? Put ice in a plastic bag. ? Place a towel between your skin and the bag. ? Leave the ice on for 20 minutes, 2-3 times a day.  Try resting in an upright position instead of lying down. This may help to reduce pressure in your ear and relieve pain.  Chew gum if it helps to relieve your ear pain.  Treat any allergies as told by your health care provider.  Keep all follow-up visits as told by your health care provider. This is important. Contact a health care provider if:  Your pain does not improve within 2 days.  Your earache gets worse.  You have new symptoms.  You have a fever. Get help right away if:  You have a severe headache.  You have a stiff neck.  You have trouble swallowing.  You have redness or swelling behind your ear.  You have fluid or blood coming from your ear.  You have hearing loss.  You feel dizzy. This information is not intended to replace advice given to you by your health care provider. Make sure you discuss any questions you have with your health care provider. Document Released: 08/24/2003 Document Revised: 09/04/2015 Document Reviewed: 07/02/2015 Elsevier Interactive Patient Education  [employer].

## 2018-07-14 NOTE — Progress Notes (Signed)
I will need the right breast ultrasound ordered as well. It is [id number]. Melissa

## 2018-07-14 NOTE — Addendum Note (Signed)
Addended by: Orland Mustard on: 07/14/2018 12:16 PM   Modules accepted: Orders

## 2018-07-15 NOTE — Addendum Note (Signed)
Addended by: Orland Mustard on: 07/15/2018 02:53 PM   Modules accepted: Orders

## 2018-07-15 NOTE — Telephone Encounter (Signed)
Pt is aware that her mammogram was sent in to [facility].

## 2018-07-19 ENCOUNTER — Ambulatory Visit (INDEPENDENT_AMBULATORY_CARE_PROVIDER_SITE_OTHER): Payer: No Typology Code available for payment source | Admitting: Family Medicine

## 2018-07-19 ENCOUNTER — Encounter: Payer: Self-pay | Admitting: Family Medicine

## 2018-07-19 ENCOUNTER — Other Ambulatory Visit: Payer: Self-pay

## 2018-07-19 ENCOUNTER — Telehealth: Payer: Self-pay | Admitting: Internal Medicine

## 2018-07-19 DIAGNOSIS — L299 Pruritus, unspecified: Secondary | ICD-10-CM

## 2018-07-19 DIAGNOSIS — T7840XA Allergy, unspecified, initial encounter: Secondary | ICD-10-CM | POA: Diagnosis not present

## 2018-07-19 MED ORDER — METHYLPREDNISOLONE 4 MG PO TBPK: ORAL_TABLET | ORAL | 0 refills | Status: DC

## 2018-07-19 MED ORDER — HYDROXYZINE HCL 10 MG PO TABS: 10.0000 mg | ORAL_TABLET | Freq: Three times a day (TID) | ORAL | 0 refills | Status: DC | PRN

## 2018-07-19 NOTE — Telephone Encounter (Signed)
Patient called stating her medication prescribed amoxicillin-clavulanate (AUGMENTIN) 875-125 MG on 6/23, is giving her a mild reaction. Patient states she has red bumps on the right foot on the back of the arm and the right foot, very itching.  Patient call back [phone]

## 2018-07-19 NOTE — Progress Notes (Signed)
Kristin [name] ID: Murvin Natal Doughman, female   DOB: Jun 17, 1960, 58 y.o.   MRN: [id number]    Virtual Visit via video Note  This visit type was conducted due to national recommendations for restrictions regarding the COVID-19 pandemic (e.g. social distancing).  This format is felt to be most appropriate for this patient at this time.  All issues noted in this document were discussed and addressed.  No physical exam was performed (except for noted visual exam findings with Video Visits).   I connected with Kristin [name] today at 11:20 AM EDT by a video enabled telemedicine application and verified that I am speaking with the correct person using two identifiers. Location patient: home Location provider: work or home office Persons participating in the virtual visit: patient, provider  I discussed the limitations, risks, security and privacy concerns of performing an evaluation and management service by video and the availability of in person appointments. I also discussed with the patient that there may be a patient responsible charge related to this service. The patient expressed understanding and agreed to proceed.   HPI:  Patient and I connected via video due to itchy rash on bilat LEs, looks like small welts, that she suspects could be from current Augmentin course. Started on this course 6/23 due to c/o ear popping and pain.   No past issues with amoxicillins/penicillins as far as she can remember. Tetracycline caused rash & face swelling.   Patient states she was thinking back to last winter where she was taken a Z-Pak and had a similar issue with red Welty-like rash on both legs.  She thought it was mosquito bites and did not attribute it to an allergic reaction.  But now that she is having the same type of rash while on a different antibiotic she is wondering if she is also allergic to azithromycin as well.  Denies fever or chills.  Denies cough, shortness breath or wheezing.  Denies chest  pain.  Denies GI or GU issues.  ROS: See pertinent positives and negatives per HPI.  Past Medical History:  Diagnosis Date  . Allergy   . Arthritis   . Basal cell carcinoma    face, back, neck, arm  . Basal cell carcinoma    nose, left cheek  . Chicken pox   . Complication of anesthesia    blood pressure elevated after gastric sleeve surgery in recovery, pt does not want mask over face  . Depression   . Diabetes mellitus without complication ([place])    pre-diabetic currently, was diabetic prior to gastric sleeve surgery   . Fatty liver   . GERD (gastroesophageal reflux disease)   . Gout   . History of shingles   . Hyperlipidemia   . Hypertension    no problems since losing weight after gastric sleeve surgery  . Insomnia   . Migraine   . Obesity   . Osteopenia     Past Surgical History:  Procedure Laterality Date  . ABDOMINAL HYSTERECTOMY    . ANKLE ARTHROSCOPY Left 05/29/2016   Procedure: ANKLE ARTHROSCOPY;  Surgeon: [name], DPM;  Location: [facility];  Service: Podiatry;  Laterality: Left;  . APPENDECTOMY    . basal cell removed    . [place]  . CHOLECYSTECTOMY    . cyst removed from wrist    . FOOT SURGERY     left foot 11/2016 Dr. Laymond Purser   . left elbow surgery  07/2017 Dr Leanor Kail epicondylitis   . MOHS SURGERY     x 2 face nose and left cheek   . OSTEOCHONDROMA EXCISION Left 05/29/2016   Procedure: OSTEOCHONDRAL DRILLING TALUS;  Surgeon: [name], DPM;  Location: [place];  Service: Podiatry;  Laterality: Left;  . PLANTAR FASCIA RELEASE Left 05/29/2016   Procedure: ENDOSCOPIC PLANTAR FASCIOTOMY;  Surgeon: [name], DPM;  Location: [place];  Service: Podiatry;  Laterality: Left;  . ROTATOR CUFF REPAIR     lt shoulder  . STOMACH SURGERY  2017   gastric sleeve surgery  . TENNIS ELBOW RELEASE/NIRSCHEL PROCEDURE Left 08/05/2017   Procedure: TENNIS ELBOW RELEASE/NIRSCHEL PROCEDURE;  Surgeon: Leanor Kail, MD;   Location: [facility];  Service: Orthopedics;  Laterality: Left;  . TUBAL LIGATION    . wedge resection of ovary  1982    Family History  Problem Relation Age of Onset  . Heart disease Mother   . Breast cancer Mother 10       metastatic, now hip, spine, and leg   . Arthritis Mother   . Hyperlipidemia Mother   . Cancer Mother        spinal surgery. breast dx'ed age 73 now metastatic 64 as of 07/13/18  . Cystic fibrosis Mother        carrier   . Heart disease Father   . Lung cancer Father   . Hyperlipidemia Father   . Hypertension Father   . Diabetes Sister   . Diabetes Maternal Aunt   . Diabetes Maternal Uncle   . Diabetes Maternal Grandfather   . Hyperlipidemia Maternal Grandfather   . Arthritis Maternal Grandmother   . Sudden death Cousin   . Diabetes Maternal Aunt   . Diabetes Sister   . Cystic fibrosis Other        great niece   . Colon cancer Neg Hx   . Ovarian cancer Neg Hx    Social History   Tobacco Use  . Smoking status: Never Smoker  . Smokeless tobacco: Never Used  Substance Use Topics  . Alcohol use: Never    Frequency: Never    Current Outpatient Medications:  .  amLODipine (NORVASC) 2.5 MG tablet, Take 1 tablet (2.5 mg total) by mouth daily. No need to cut pill in 1/2 take whole pill, Disp: 90 tablet, Rfl: 3 .  amoxicillin-clavulanate (AUGMENTIN) 875-125 MG tablet, Take 1 tablet by mouth 2 (two) times daily. With food x 7-10 days, Disp: 20 tablet, Rfl: 0 .  celecoxib (CELEBREX) 100 MG capsule, Take by mouth., Disp: , Rfl:  .  estradiol (ESTRACE) 1 MG tablet, TAKE 1 TABLET BY MOUTH EVERY DAY, Disp: 90 tablet, Rfl: 3 .  FLUoxetine (PROZAC) 40 MG capsule, Take 1 capsule (40 mg total) by mouth daily., Disp: 90 capsule, Rfl: 3 .  Galcanezumab-gnlm (EMGALITY) 120 MG/ML SOAJ, Inject 120 mg into the skin every 30 (thirty) days., Disp: 1 pen, Rfl: 11 .  loratadine (CLARITIN) 10 MG tablet, Take 10 mg by mouth daily as needed for allergies., Disp: , Rfl:  .   neomycin-polymyxin-hydrocortisone (CORTISPORIN) OTIC solution, Place 4 drops into the left ear 4 (four) times daily. X 7 days, Disp: 10 mL, Rfl: 0 .  omeprazole (PRILOSEC) 20 MG capsule, Take 20 mg by mouth daily as needed (indigestion)., Disp: , Rfl:  .  ondansetron (ZOFRAN-ODT) 4 MG disintegrating tablet, Take 1 tablet (4 mg total) by mouth every 8 (eight) hours as needed for nausea., Disp: 30 tablet,  Rfl: 3 .  rizatriptan (MAXALT-MLT) 10 MG disintegrating tablet, Take 1 tablet (10 mg total) by mouth as needed for migraine. May repeat in 2 hours if needed, Disp: 10 tablet, Rfl: 11 .  rosuvastatin (CRESTOR) 20 MG tablet, Take 1 tablet (20 mg total) by mouth at bedtime., Disp: 90 tablet, Rfl: 3 .  zolpidem (AMBIEN CR) 12.5 MG CR tablet, Take 1 tablet (12.5 mg total) by mouth at bedtime as needed., Disp: 30 tablet, Rfl: 2  EXAM:  GENERAL: alert, oriented, appears well and in no acute distress  HEENT: atraumatic, conjunttiva clear, no obvious abnormalities on inspection of external nose and ears  NECK: normal movements of the head and neck  LUNGS: on inspection no signs of respiratory distress, breathing rate appears normal, no obvious gross SOB, gasping or wheezing  CV: no obvious cyanosis  SKIN: Faint red raised rash areas on bilateral lower extremities, do appear about the diameter of pencil eraser is and if you are about the diameter of a dime.  Areas are suspicious for possible allergic reaction due to drug.  MS: moves all visible extremities without noticeable abnormality  PSYCH/NEURO: pleasant and cooperative, no obvious depression or anxiety, speech and thought processing grossly intact  ASSESSMENT AND PLAN:  Discussed the following assessment and plan:   Allergic reaction due to drug, itching- suspect patient is having a drug allergy causing the rash/welt-like areas on legs.  She has been advised to stop the Augmentin and this has been added to her allergy list.  Patient also use  hydroxyzine as needed to help calm down the itching sensation, patient aware that hydroxyzine can cause drowsiness.  I will also place referral to patient for allergy testing; discussed with patient that we want to be sure she is truly allergic to these antibiotics because if she is not truly allergic to the antibiotics we want them to be available for use if ever needed in time of infection.  Advised if rash becomes worse, any other symptoms develop such as tingling in lips, scratchiness in throat to call right away and or go to ER for emergency evaluation.  I discussed the assessment and treatment plan with the patient. The patient was provided an opportunity to ask questions and all were answered. The patient agreed with the plan and demonstrated an understanding of the instructions.   The patient was advised to call back or seek an in-person evaluation if the symptoms worsen or if the condition fails to improve as anticipated.  Jodelle Green, FNP

## 2018-07-19 NOTE — Telephone Encounter (Signed)
Called and spoke to patient.  Patient said that she started taking amoxicillin last Wednesday and noticed she had a red bump on Friday which later turned into a red, itchy rash on her foot and back on Saturday.  Patient said that she stopped taking amoxicillin on Saturday but still has the rash today but says that the rash has localized and not spread but the itching is still bad.  Patient said that she has taken benadryl, used hydrocortisone cream, and lavender oil but has neither has relieved the itching.  Patient was scheduled a virtual visit with Philis Nettle today at 11:20 am.

## 2018-07-22 ENCOUNTER — Ambulatory Visit
Admission: RE | Admit: 2018-07-22 | Discharge: 2018-07-22 | Disposition: A | Discharge status: Home / Self Care | Payer: No Typology Code available for payment source | Source: Ambulatory Visit | Attending: Internal Medicine | Admitting: Internal Medicine

## 2018-07-22 ENCOUNTER — Other Ambulatory Visit: Payer: Self-pay

## 2018-07-22 DIAGNOSIS — M79622 Pain in left upper arm: Secondary | ICD-10-CM | POA: Diagnosis present

## 2018-07-22 DIAGNOSIS — N644 Mastodynia: Secondary | ICD-10-CM | POA: Diagnosis present

## 2018-08-06 ENCOUNTER — Ambulatory Visit (INDEPENDENT_AMBULATORY_CARE_PROVIDER_SITE_OTHER): Payer: No Typology Code available for payment source | Admitting: Internal Medicine

## 2018-08-06 ENCOUNTER — Other Ambulatory Visit: Payer: Self-pay

## 2018-08-06 DIAGNOSIS — M5412 Radiculopathy, cervical region: Secondary | ICD-10-CM | POA: Diagnosis not present

## 2018-08-06 DIAGNOSIS — M542 Cervicalgia: Secondary | ICD-10-CM | POA: Diagnosis not present

## 2018-08-06 MED ORDER — OXYCODONE-ACETAMINOPHEN 5-325 MG PO TABS: 1.0000 | ORAL_TABLET | Freq: Two times a day (BID) | ORAL | 0 refills | Status: DC | PRN

## 2018-08-06 MED ORDER — DIAZEPAM 5 MG PO TABS: 5.0000 mg | ORAL_TABLET | ORAL | 1 refills | Status: DC | PRN

## 2018-08-06 MED ORDER — CYCLOBENZAPRINE HCL 10 MG PO TABS: 10.0000 mg | ORAL_TABLET | Freq: Two times a day (BID) | ORAL | 0 refills | Status: DC | PRN

## 2018-08-06 MED ORDER — PREDNISONE 20 MG PO TABS: 20.0000 mg | ORAL_TABLET | Freq: Every day | ORAL | 0 refills | Status: DC

## 2018-08-06 NOTE — Patient Instructions (Signed)
Neck Exercises Ask your health care provider which exercises are safe for you. Do exercises exactly as told by your health care provider and adjust them as directed. It is normal to feel mild stretching, pulling, tightness, or discomfort as you do these exercises. Stop right away if you feel sudden pain or your pain gets worse. Do not begin these exercises until told by your health care provider. Neck exercises can be important for many reasons. They can improve strength and maintain flexibility in your neck, which will help your upper back and prevent neck pain. Stretching exercises Rotation neck stretching  1. Sit in a chair or stand up. 2. Place your feet flat on the floor, shoulder width apart. 3. Slowly turn your head (rotate) to the right until a slight stretch is felt. Turn it all the way to the right so you can look over your right shoulder. Do not tilt or tip your head. 4. Hold this position for 10-30 seconds. 5. Slowly turn your head (rotate) to the left until a slight stretch is felt. Turn it all the way to the left so you can look over your left shoulder. Do not tilt or tip your head. 6. Hold this position for 10-30 seconds. Repeat __________ times. Complete this exercise __________ times a day. Neck retraction 1. Sit in a sturdy chair or stand up. 2. Look straight ahead. Do not bend your neck. 3. Use your fingers to push your chin backward (retraction). Do not bend your neck for this movement. Continue to face straight ahead. If you are doing the exercise properly, you will feel a slight sensation in your throat and a stretch at the back of your neck. 4. Hold the stretch for 1-2 seconds. Repeat __________ times. Complete this exercise __________ times a day. Strengthening exercises Neck press 1. Lie on your back on a firm bed or on the floor with a pillow under your head. 2. Use your neck muscles to push your head down on the pillow and straighten your spine. 3. Hold the position  as well as you can. Keep your head facing up (in a neutral position) and your chin tucked. 4. Slowly count to 5 while holding this position. Repeat __________ times. Complete this exercise __________ times a day. Isometrics These are exercises in which you strengthen the muscles in your neck while keeping your neck still (isometrics). 1. Sit in a supportive chair and place your hand on your forehead. 2. Keep your head and face facing straight ahead. Do not flex or extend your neck while doing isometrics. 3. Push forward with your head and neck while pushing back with your hand. Hold for 10 seconds. 4. Do the sequence again, this time putting your hand against the back of your head. Use your head and neck to push backward against the hand pressure. 5. Finally, do the same exercise on either side of your head, pushing sideways against the pressure of your hand. Repeat __________ times. Complete this exercise __________ times a day. Prone head lifts 1. Lie face-down (prone position), resting on your elbows so that your chest and upper back are raised. 2. Start with your head facing downward, near your chest. Position your chin either on or near your chest. 3. Slowly lift your head upward. Lift until you are looking straight ahead. Then continue lifting your head as far back as you can comfortably stretch. 4. Hold your head up for 5 seconds. Then slowly lower it to your starting position. Repeat __________   times. Complete this exercise __________ times a day. Supine head lifts 1. Lie on your back (supine position), bending your knees to point to the ceiling and keeping your feet flat on the floor. 2. Lift your head slowly off the floor, raising your chin toward your chest. 3. Hold for 5 seconds. Repeat __________ times. Complete this exercise __________ times a day. Scapular retraction 1. Stand with your arms at your sides. Look straight ahead. 2. Slowly pull both shoulders (scapulae) backward  and downward (retraction) until you feel a stretch between your shoulder blades in your upper back. 3. Hold for 10-30 seconds. 4. Relax and repeat. Repeat __________ times. Complete this exercise __________ times a day. Contact a health care provider if:  Your neck pain or discomfort gets much worse when you do an exercise.  Your neck pain or discomfort does not improve within 2 hours after you exercise. If you have any of these problems, stop exercising right away. Do not do the exercises again unless your health care provider says that you can. Get help right away if:  You develop sudden, severe neck pain. If this happens, stop exercising right away. Do not do the exercises again unless your health care provider says that you can. This information is not intended to replace advice given to you by your health care provider. Make sure you discuss any questions you have with your health care provider. Document Released: 12/18/2014 Document Revised: 11/04/2017 Document Reviewed: 11/04/2017 Elsevier Patient Education  [place].  Cervical Radiculopathy  Cervical radiculopathy happens when a nerve in the neck (a cervical nerve) is pinched or bruised. This condition can happen because of an injury to the cervical spine (vertebrae) in the neck, or as part of the normal aging process. Pressure on the cervical nerves can cause pain or numbness that travels from the neck all the way down into the arm and fingers. Usually, this condition gets better with rest. Treatment may be needed if the condition does not improve. What are the causes? This condition may be caused by:  A neck injury.  A bulging (herniated) disk.  Muscle spasms.  Muscle tightness in the neck because of overuse.  Arthritis.  Breakdown or degeneration in the bones and joints of the spine (spondylosis) due to aging.  Bone spurs that may develop near the cervical nerves. What are the signs or symptoms? Symptoms of  this condition include:  Pain. The pain may travel from the neck to the arm and hand. The pain can be severe or irritating. It may be worse when you move your neck.  Numbness or tingling in your arm or hand.  Weakness in the affected arm and hand, in severe cases. How is this diagnosed? This condition may be diagnosed based on your symptoms, your medical history, and a physical exam. You may also have tests, including:  X-rays.  A CT scan.  An MRI.  An electromyogram (EMG).  Nerve conduction tests. How is this treated? In many cases, treatment is not needed for this condition. With rest, the condition usually gets better over time. If treatment is needed, options may include:  Wearing a soft neck collar (cervical collar) for short periods of time, as told by your health care provider.  Doing physical therapy to strengthen your neck muscles.  Taking medicines, such as NSAIDs or oral corticosteroids.  Having spinal injections, in severe cases.  Having surgery. This may be needed if other treatments do not help. Different types of surgery  may be done depending on the cause of this condition. Follow these instructions at home: If you have a cervical collar:  Wear it as told by your health care provider. Remove it only as told by your health care provider.  Ask your health care provider if you can remove the collar for cleaning and bathing. If you are allowed to remove the collar for cleaning or bathing: ? Follow instructions from your health care provider about how to remove the collar safely. ? Clean the collar by wiping it with mild soap and water and drying it completely. ? Take out any removable pads in the collar every 1-2 days, and wash them by hand with soap and water. Let them air-dry completely before you put them back in the collar. ? Check your skin under the collar for irritation or sores. If you see any, tell your health care provider. Managing pain      Take  over-the-counter and prescription medicines only as told by your health care provider.  If directed, put ice on the affected area. ? If you have a soft neck collar, remove it as told by your health care provider. ? Put ice in a plastic bag. ? Place a towel between your skin and the bag. ? Leave the ice on for 20 minutes, 2-3 times a day.  If applying ice does not help, you can try using heat. Use the heat source that your health care provider recommends, such as a moist heat pack or a heating pad. ? Place a towel between your skin and the heat source. ? Leave the heat on for 20-30 minutes. ? Remove the heat if your skin turns bright red. This is especially important if you are unable to feel pain, heat, or cold. You may have a greater risk of getting burned.  Try a gentle neck and shoulder massage to help relieve symptoms. Activity  Rest as needed.  Return to your normal activities as told by your health care provider. Ask your health care provider what activities are safe for you.  Do stretching and strengthening exercises as told by your health care provider or physical therapist.  Do not lift anything that is heavier than 10 lb (4.5 kg) until your health care provider tells you that it is safe. General instructions  Use a flat pillow when you sleep.  Do not drive while wearing a cervical collar. If you do not have a cervical collar, ask your health care provider if it is safe to drive while your neck heals.  Ask your health care provider if the medicine prescribed to you requires you to avoid driving or using heavy machinery.  Do not use any products that contain nicotine or tobacco, such as cigarettes, e-cigarettes, and chewing tobacco. These can delay healing. If you need help quitting, ask your health care provider.  Keep all follow-up visits as told by your health care provider. This is important. Contact a health care provider if:  Your condition does not improve with  treatment. Get help right away if:  Your pain gets much worse and cannot be controlled with medicines.  You have weakness or numbness in your hand, arm, face, or leg.  You have a high fever.  You have a stiff, rigid neck.  You lose control of your bowels or your bladder (have incontinence).  You have trouble with walking, balance, or speaking. Summary  Cervical radiculopathy happens when a nerve in the neck is pinched or bruised.  A  nerve can get pinched from a bulging disk, arthritis, muscle spasms, or an injury to the neck.  Symptoms include pain, tingling, or numbness radiating from the neck into the arm or hand. Weakness can also occur in severe cases.  Treatment may include rest, wearing a cervical collar, and physical therapy. Medicines may be prescribed to help with pain. In severe cases, injections or surgery may be needed. This information is not intended to replace advice given to you by your health care provider. Make sure you discuss any questions you have with your health care provider. Document Released: 10/01/2000 Document Revised: 11/27/2017 Document Reviewed: 11/27/2017 Elsevier Patient Education  2020 [employer].

## 2018-08-06 NOTE — Progress Notes (Signed)
Virtual Visit via Video Note  I connected with Kristin [name]  on 08/06/18 at  2:00 PM EDT by a video enabled telemedicine application and verified that I am speaking with the correct person using two identifiers.  Location patient: home Location provider:work or home office Persons participating in the virtual visit: patient, provider, pts son  I discussed the limitations of evaluation and management by telemedicine and the availability of in person appointments. The patient expressed understanding and agreed to proceed.   HPI: C/o neck pain since 08/01/2018 and worsening and neck stiffness. She tried heat which helps not tried ice. She cant tell if flexeril 10 mg qhs helps but makes her feel like zombie. Pain is 6-10/10 at times and feels like a pulling sensation on the right side of neck worse  With moving and ROM turning head to the left >right and looking up at the ceiling or if she moves her arm she gets a stabbing pain in her neck and feels "bruised" and neck is popping. Reviewed MRI 01/18/16 moderate facet changes on the right and mild on the left and disc protrusion mild impingment. She has been traveling to [facility] to help ill mom and lifting her of heavy wheelchair and just got back Sunday and will be here for a month. Naprosyn, Tylenol no help so she tried leftover norco or percocet which helped some.    Allergic reaction to Augmentin she has appt 08/19/18 to see [facility] for allergy testing also allergic to tetracyclines and zpack    ROS: See pertinent positives and negatives per HPI.  Past Medical History:  Diagnosis Date  . Allergy   . Arthritis   . Basal cell carcinoma    face, back, neck, arm  . Basal cell carcinoma    nose, left cheek  . Chicken pox   . Complication of anesthesia    blood pressure elevated after gastric sleeve surgery in recovery, pt does not want mask over face  . Depression   . Diabetes mellitus without complication ([place])    pre-diabetic currently,  was diabetic prior to gastric sleeve surgery   . Fatty liver   . GERD (gastroesophageal reflux disease)   . Gout   . History of shingles   . Hyperlipidemia   . Hypertension    no problems since losing weight after gastric sleeve surgery  . Insomnia   . Migraine   . Obesity   . Osteopenia     Past Surgical History:  Procedure Laterality Date  . ABDOMINAL HYSTERECTOMY    . ANKLE ARTHROSCOPY Left 05/29/2016   Procedure: ANKLE ARTHROSCOPY;  Surgeon: [name], DPM;  Location: [facility];  Service: Podiatry;  Laterality: Left;  . APPENDECTOMY    . basal cell removed    . [place]  . CHOLECYSTECTOMY    . cyst removed from wrist    . FOOT SURGERY     left foot 11/2016 Dr. Laymond Purser   . left elbow surgery     07/2017 Dr Leanor Kail epicondylitis   . MOHS SURGERY     x 2 face nose and left cheek   . OSTEOCHONDROMA EXCISION Left 05/29/2016   Procedure: OSTEOCHONDRAL DRILLING TALUS;  Surgeon: [name], DPM;  Location: [place];  Service: Podiatry;  Laterality: Left;  . PLANTAR FASCIA RELEASE Left 05/29/2016   Procedure: ENDOSCOPIC PLANTAR FASCIOTOMY;  Surgeon: [name], DPM;  Location: [facility];  Service: Podiatry;  Laterality: Left;  . ROTATOR CUFF REPAIR     lt shoulder  . STOMACH SURGERY  2017   gastric sleeve surgery  . TENNIS ELBOW RELEASE/NIRSCHEL PROCEDURE Left 08/05/2017   Procedure: TENNIS ELBOW RELEASE/NIRSCHEL PROCEDURE;  Surgeon: Leanor Kail, MD;  Location: [facility];  Service: Orthopedics;  Laterality: Left;  . TUBAL LIGATION    . wedge resection of ovary  1982    Family History  Problem Relation Age of Onset  . Heart disease Mother   . Breast cancer Mother 13       metastatic, now hip, spine, and leg   . Arthritis Mother   . Hyperlipidemia Mother   . Cancer Mother        spinal surgery. breast dx'ed age 22 now metastatic 27 as of 07/13/18  . Cystic fibrosis Mother        carrier   . Heart disease Father   . Lung cancer  Father   . Hyperlipidemia Father   . Hypertension Father   . Diabetes Sister   . Diabetes Maternal Aunt   . Diabetes Maternal Uncle   . Diabetes Maternal Grandfather   . Hyperlipidemia Maternal Grandfather   . Arthritis Maternal Grandmother   . Sudden death Cousin   . Diabetes Maternal Aunt   . Diabetes Sister   . Cystic fibrosis Other        great niece   . Colon cancer Neg Hx   . Ovarian cancer Neg Hx     SOCIAL HX: married with kids   Current Outpatient Medications:  .  amLODipine (NORVASC) 2.5 MG tablet, Take 1 tablet (2.5 mg total) by mouth daily. No need to cut pill in 1/2 take whole pill, Disp: 90 tablet, Rfl: 3 .  celecoxib (CELEBREX) 100 MG capsule, Take by mouth., Disp: , Rfl:  .  cyclobenzaprine (FLEXERIL) 10 MG tablet, Take 1 tablet (10 mg total) by mouth 2 (two) times daily as needed for muscle spasms., Disp: 30 tablet, Rfl: 0 .  diazepam (VALIUM) 5 MG tablet, Take 1 tablet (5 mg total) by mouth as needed for anxiety. 30 minutes prior to MRI. Repeat another dose 15 min before MRI, Disp: 4 tablet, Rfl: 1 .  estradiol (ESTRACE) 1 MG tablet, TAKE 1 TABLET BY MOUTH EVERY DAY, Disp: 90 tablet, Rfl: 3 .  FLUoxetine (PROZAC) 40 MG capsule, Take 1 capsule (40 mg total) by mouth daily., Disp: 90 capsule, Rfl: 3 .  Galcanezumab-gnlm (EMGALITY) 120 MG/ML SOAJ, Inject 120 mg into the skin every 30 (thirty) days., Disp: 1 pen, Rfl: 11 .  hydrOXYzine (ATARAX/VISTARIL) 10 MG tablet, Take 1 tablet (10 mg total) by mouth 3 (three) times daily as needed for itching., Disp: 30 tablet, Rfl: 0 .  loratadine (CLARITIN) 10 MG tablet, Take 10 mg by mouth daily as needed for allergies., Disp: , Rfl:  .  methylPREDNISolone (MEDROL DOSEPAK) 4 MG TBPK tablet, Take according to pack instructions, Disp: 21 tablet, Rfl: 0 .  neomycin-polymyxin-hydrocortisone (CORTISPORIN) OTIC solution, Place 4 drops into the left ear 4 (four) times daily. X 7 days, Disp: 10 mL, Rfl: 0 .  omeprazole (PRILOSEC) 20 MG  capsule, Take 20 mg by mouth daily as needed (indigestion)., Disp: , Rfl:  .  ondansetron (ZOFRAN-ODT) 4 MG disintegrating tablet, Take 1 tablet (4 mg total) by mouth every 8 (eight) hours as needed for nausea., Disp: 30 tablet, Rfl: 3 .  oxyCODONE-acetaminophen (PERCOCET) 5-325 MG tablet, Take 1 tablet by mouth 2 (two) times daily  as needed for severe pain., Disp: 10 tablet, Rfl: 0 .  predniSONE (DELTASONE) 20 MG tablet, Take 1 tablet (20 mg total) by mouth daily with breakfast., Disp: 7 tablet, Rfl: 0 .  rizatriptan (MAXALT-MLT) 10 MG disintegrating tablet, Take 1 tablet (10 mg total) by mouth as needed for migraine. May repeat in 2 hours if needed, Disp: 10 tablet, Rfl: 11 .  rosuvastatin (CRESTOR) 20 MG tablet, Take 1 tablet (20 mg total) by mouth at bedtime., Disp: 90 tablet, Rfl: 3 .  zolpidem (AMBIEN CR) 12.5 MG CR tablet, Take 1 tablet (12.5 mg total) by mouth at bedtime as needed., Disp: 30 tablet, Rfl: 2  EXAM:  VITALS per patient if applicable:  GENERAL: alert, oriented, appears well and in no acute distress  HEENT: atraumatic, conjunttiva clear, no obvious abnormalities on inspection of external nose and ears  NECK: normal movements of the head and neck  LUNGS: on inspection no signs of respiratory distress, breathing rate appears normal, no obvious gross SOB, gasping or wheezing  CV: no obvious cyanosis  MS: moves all visible extremities without noticeable abnormality. Pain with ROM esp extension of neck and turning neck to left   PSYCH/NEURO: pleasant and cooperative, no obvious depression or anxiety, speech and thought processing grossly intact  ASSESSMENT AND PLAN:  Discussed the following assessment and plan:  Cervical radiculopathy - Plan: MR Cervical Spine Wo Contrast, oxyCODONE-acetaminophen (PERCOCET) 5-325 MG tablet bid prn, cyclobenzaprine (FLEXERIL) 10 MG tablet bid prn, predniSONE (DELTASONE) 20 MG tablet qd x 1 week, diazepam (VALIUM) 5 MG tablet 1-2 prior to  MRI  -consider PT and NS vs PM&r in future   Allergic reaction to medications  -allergy appt 08/19/18   HM Ordered fasting labs  Flu shot utd  utdTdap  Disc shingrixprev hep A vaccine1/2 2nd dose due in 11/05/17-05/07/2018, hep B immunewith h/o fatty liver Hep C neg MMR immune A1C check 6.4   Never smoker, chew congratulatednot doing either mammoneg7/02/2018 normal  Colonoscopy -obtained records 07/15/11 colonoscopy Dr. Candace Cruise nl repeat in 10 years  Pap s/p hysterectomy ? Cervix intact has appt Dr. Enzo Bi 02/10/17.-Appt went well1/2019 ob/gynf/u in 1 year  -per notepap not needed  Dermatology sees [facility] q6 months h/o BCC nose and left cheek s/p Mohs and h/o Aks  Of not she saw [facility] eye late March 2019 and had eye glasses checked     I discussed the assessment and treatment plan with the patient. The patient was provided an opportunity to ask questions and all were answered. The patient agreed with the plan and demonstrated an understanding of the instructions.   The patient was advised to call back or seek an in-person evaluation if the symptoms worsen or if the condition fails to improve as anticipated.  Time spent 25 minutes  Delorise Jackson, MD

## 2018-08-10 ENCOUNTER — Other Ambulatory Visit: Payer: Self-pay | Admitting: Internal Medicine

## 2018-08-10 ENCOUNTER — Telehealth: Payer: Self-pay | Admitting: Internal Medicine

## 2018-08-10 DIAGNOSIS — G4700 Insomnia, unspecified: Secondary | ICD-10-CM

## 2018-08-10 MED ORDER — ZOLPIDEM TARTRATE ER 12.5 MG PO TBCR: 12.5000 mg | EXTENDED_RELEASE_TABLET | Freq: Every evening | ORAL | 2 refills | Status: DC | PRN

## 2018-08-10 NOTE — Telephone Encounter (Signed)
Caller name: Lattie Haw from [place]  Call back number: [phone]   Reason for call:  [facility] requesting order for MRI,  dx code, location and clinical notes please fax # [phone] attention case # [id number]

## 2018-08-12 NOTE — Telephone Encounter (Signed)
Per referral note [facility] faxed information over to them on 7/22

## 2018-08-17 ENCOUNTER — Ambulatory Visit
Admission: RE | Admit: 2018-08-17 | Discharge: 2018-08-17 | Disposition: A | Discharge status: Home / Self Care | Payer: No Typology Code available for payment source | Source: Ambulatory Visit | Attending: Internal Medicine | Admitting: Internal Medicine

## 2018-08-17 ENCOUNTER — Other Ambulatory Visit: Payer: Self-pay

## 2018-08-17 DIAGNOSIS — M5412 Radiculopathy, cervical region: Secondary | ICD-10-CM | POA: Diagnosis present

## 2018-08-23 ENCOUNTER — Other Ambulatory Visit: Payer: Self-pay | Admitting: Internal Medicine

## 2018-08-23 ENCOUNTER — Encounter: Payer: Self-pay | Admitting: Internal Medicine

## 2018-08-23 DIAGNOSIS — M542 Cervicalgia: Secondary | ICD-10-CM

## 2018-08-25 ENCOUNTER — Other Ambulatory Visit: Payer: Self-pay

## 2018-08-25 ENCOUNTER — Ambulatory Visit (INDEPENDENT_AMBULATORY_CARE_PROVIDER_SITE_OTHER): Payer: No Typology Code available for payment source | Admitting: Internal Medicine

## 2018-08-25 DIAGNOSIS — T7840XA Allergy, unspecified, initial encounter: Secondary | ICD-10-CM

## 2018-08-25 DIAGNOSIS — R21 Rash and other nonspecific skin eruption: Secondary | ICD-10-CM

## 2018-08-25 DIAGNOSIS — L299 Pruritus, unspecified: Secondary | ICD-10-CM | POA: Diagnosis not present

## 2018-08-25 DIAGNOSIS — L509 Urticaria, unspecified: Secondary | ICD-10-CM | POA: Diagnosis not present

## 2018-08-25 DIAGNOSIS — L282 Other prurigo: Secondary | ICD-10-CM

## 2018-08-25 MED ORDER — HYDROXYZINE HCL 10 MG PO TABS: 5.0000 mg | ORAL_TABLET | Freq: Three times a day (TID) | ORAL | 0 refills | Status: DC | PRN

## 2018-08-25 MED ORDER — CLOBETASOL PROPIONATE 0.05 % EX CREA: 1.0000 "application " | TOPICAL_CREAM | Freq: Two times a day (BID) | CUTANEOUS | 0 refills | Status: DC

## 2018-08-25 NOTE — Progress Notes (Addendum)
Virtual Visit via Video Note  I connected with Kristin [name]  on 08/25/18 at  1:00 PM EDT by a video enabled telemedicine application and verified that I am speaking with the correct person using two identifiers.  Location patient: home Location provider:work or home office Persons participating in the virtual visit: patient, provider  I discussed the limitations of evaluation and management by telemedicine and the availability of in person appointments. The patient expressed understanding and agreed to proceed.   H[place]: [place] itching red bumps raised x 4 on left forearm and 2 on web of finger right and and 1 on right upper back of forearm she is unsure if something bite her and reports areas itchy nothing tried though she is on allegra now instead of claritin for allergies. No one in household is itching. No new lotions, soaps (uses Dove), laundry detergents, perfumes.  She did f/u allergist in [employer] and was sch to have PCN allergy test 08/27/18 but has to be done in 10/2018 in [place] now due to rash as of now     ROS: See pertinent positives and negatives per HPI.  Past Medical History:  Diagnosis Date  . Allergy   . Arthritis   . Basal cell carcinoma    face, back, neck, arm  . Basal cell carcinoma    nose, left cheek  . Chicken pox   . Complication of anesthesia    blood pressure elevated after gastric sleeve surgery in recovery, pt does not want mask over face  . Depression   . Diabetes mellitus without complication ([place])    pre-diabetic currently, was diabetic prior to gastric sleeve surgery   . Fatty liver   . GERD (gastroesophageal reflux disease)   . Gout   . History of shingles   . Hyperlipidemia   . Hypertension    no problems since losing weight after gastric sleeve surgery  . Insomnia   . Migraine   . Obesity   . Osteopenia     Past Surgical History:  Procedure Laterality Date  . ABDOMINAL HYSTERECTOMY    . ANKLE  ARTHROSCOPY Left 05/29/2016   Procedure: ANKLE ARTHROSCOPY;  Surgeon: [name], DPM;  Location: [facility];  Service: Podiatry;  Laterality: Left;  . APPENDECTOMY    . basal cell removed    . [place]  . CHOLECYSTECTOMY    . cyst removed from wrist    . FOOT SURGERY     left foot 11/2016 Dr. Laymond Purser   . left elbow surgery     07/2017 Dr Leanor Kail epicondylitis   . MOHS SURGERY     x 2 face nose and left cheek   . OSTEOCHONDROMA EXCISION Left 05/29/2016   Procedure: OSTEOCHONDRAL DRILLING TALUS;  Surgeon: [name], DPM;  Location: [facility];  Service: Podiatry;  Laterality: Left;  . PLANTAR FASCIA RELEASE Left 05/29/2016   Procedure: ENDOSCOPIC PLANTAR FASCIOTOMY;  Surgeon: [name], DPM;  Location: [facility];  Service: Podiatry;  Laterality: Left;  . ROTATOR CUFF REPAIR     lt shoulder  . STOMACH SURGERY  2017   gastric sleeve surgery  . TENNIS ELBOW RELEASE/NIRSCHEL PROCEDURE Left 08/05/2017   Procedure: TENNIS ELBOW RELEASE/NIRSCHEL PROCEDURE;  Surgeon: Leanor Kail, MD;  Location: [facility];  Service: Orthopedics;  Laterality: Left;  . TUBAL LIGATION    . wedge resection of ovary  1982  Family History  Problem Relation Age of Onset  . Heart disease Mother   . Breast cancer Mother 67       metastatic, now hip, spine, and leg   . Arthritis Mother   . Hyperlipidemia Mother   . Cancer Mother        spinal surgery. breast dx'ed age 40 now metastatic 84 as of 07/13/18  . Cystic fibrosis Mother        carrier   . Heart disease Father   . Lung cancer Father   . Hyperlipidemia Father   . Hypertension Father   . Diabetes Sister   . Diabetes Maternal Aunt   . Diabetes Maternal Uncle   . Diabetes Maternal Grandfather   . Hyperlipidemia Maternal Grandfather   . Arthritis Maternal Grandmother   . Sudden death Cousin   . Diabetes Maternal Aunt   . Diabetes Sister   . Cystic fibrosis Other        great niece   . Colon cancer Neg Hx   .  Ovarian cancer Neg Hx     SOCIAL HX: married lives with family    Current Outpatient Medications:  .  amLODipine (NORVASC) 2.5 MG tablet, Take 1 tablet (2.5 mg total) by mouth daily. No need to cut pill in 1/2 take whole pill, Disp: 90 tablet, Rfl: 3 .  celecoxib (CELEBREX) 100 MG capsule, Take by mouth., Disp: , Rfl:  .  clobetasol cream (TEMOVATE) 3.30 %, Apply 1 application topically 2 (two) times daily. Prn not on face, Disp: 60 g, Rfl: 0 .  cyclobenzaprine (FLEXERIL) 10 MG tablet, Take 1 tablet (10 mg total) by mouth 2 (two) times daily as needed for muscle spasms., Disp: 30 tablet, Rfl: 0 .  diazepam (VALIUM) 5 MG tablet, Take 1 tablet (5 mg total) by mouth as needed for anxiety. 30 minutes prior to MRI. Repeat another dose 15 min before MRI, Disp: 4 tablet, Rfl: 1 .  estradiol (ESTRACE) 1 MG tablet, TAKE 1 TABLET BY MOUTH EVERY DAY, Disp: 90 tablet, Rfl: 3 .  FLUoxetine (PROZAC) 40 MG capsule, Take 1 capsule (40 mg total) by mouth daily., Disp: 90 capsule, Rfl: 3 .  Galcanezumab-gnlm (EMGALITY) 120 MG/ML SOAJ, Inject 120 mg into the skin every 30 (thirty) days., Disp: 1 pen, Rfl: 11 .  hydrOXYzine (ATARAX/VISTARIL) 10 MG tablet, Take 0.5-1 tablets (5-10 mg total) by mouth 3 (three) times daily as needed for itching., Disp: 90 tablet, Rfl: 0 .  methylPREDNISolone (MEDROL DOSEPAK) 4 MG TBPK tablet, Take according to pack instructions, Disp: 21 tablet, Rfl: 0 .  neomycin-polymyxin-hydrocortisone (CORTISPORIN) OTIC solution, Place 4 drops into the left ear 4 (four) times daily. X 7 days, Disp: 10 mL, Rfl: 0 .  omeprazole (PRILOSEC) 20 MG capsule, Take 20 mg by mouth daily as needed (indigestion)., Disp: , Rfl:  .  ondansetron (ZOFRAN-ODT) 4 MG disintegrating tablet, Take 1 tablet (4 mg total) by mouth every 8 (eight) hours as needed for nausea., Disp: 30 tablet, Rfl: 3 .  oxyCODONE-acetaminophen (PERCOCET) 5-325 MG tablet, Take 1 tablet by mouth 2 (two) times daily as needed for severe pain.,  Disp: 10 tablet, Rfl: 0 .  predniSONE (DELTASONE) 20 MG tablet, Take 1 tablet (20 mg total) by mouth daily with breakfast., Disp: 7 tablet, Rfl: 0 .  rizatriptan (MAXALT-MLT) 10 MG disintegrating tablet, Take 1 tablet (10 mg total) by mouth as needed for migraine. May repeat in 2 hours if needed, Disp: 10 tablet, Rfl: 11 .  rosuvastatin (CRESTOR) 20 MG tablet, Take 1 tablet (20 mg total) by mouth at bedtime., Disp: 90 tablet, Rfl: 3 .  zolpidem (AMBIEN CR) 12.5 MG CR tablet, Take 1 tablet (12.5 mg total) by mouth at bedtime as needed., Disp: 30 tablet, Rfl: 2  EXAM:  VITALS per patient if applicable:  GENERAL: alert, oriented, appears well and in no acute distress  HEENT: atraumatic, conjunttiva clear, no obvious abnormalities on inspection of external nose and ears  NECK: normal movements of the head and neck  LUNGS: on inspection no signs of respiratory distress, breathing rate appears normal, no obvious gross SOB, gasping or wheezing  CV: no obvious cyanosis  MS: moves all visible extremities without noticeable abnormality  PSYCH/NEURO: pleasant and cooperative, no obvious depression or anxiety, speech and thought processing grossly intact  Skin-4 pruritic papules to left forearm, 2 right finger web and 1 right upper arm    ASSESSMENT AND PLAN:  Discussed the following assessment and plan:  Papular pruritic rash ? Hives  Vs bite vs contact vs other (I.e drug etc) - Plan: Ambulatory referral to Dermatology on [address]  Right hand bx Dr Raliegh Ip dermatology 08/26/18 arthropod bite reaction  Rx Clobetasol   Itch - Plan: hydrOXYzine (ATARAX/VISTARIL) 10 MG tablet, clobetasol cream (TEMOVATE) 0.05 % bid prn   HM  Ordered fasting labs pending  Flu shot utd utdTdap  Disc shingrixprev hep A vaccine1/2 2nd dose due in 11/05/17-05/07/2018, hep B immunewith h/o fatty liver Hep C neg MMRimmune A1C check 6.4   Never smoker, chew congratulatednot doing  either mammoneg7/02/2018 normal  Colonoscopy -obtained records 07/15/11 colonoscopy Dr. Candace Cruise nl repeat in 10 years  Pap s/p hysterectomy ? Cervix intact has appt Dr. Enzo Bi 02/10/17.-Appt went well1/2019 ob/gynf/u in 1 year  -per notepap not needed  Dermatology sees [place] [facility] q6 months h/o BCC nose and left cheek s/p Mohs and h/o Aks  Of not she saw [facility] eye late March 2019 and had eye glasses checked  rheumatology Dr. Amil Amen pain in hands and feet  NS-referral pending  Allergist in [place] will f/u 10/2018      I discussed the assessment and treatment plan with the patient. The patient was provided an opportunity to ask questions and all were answered. The patient agreed with the plan and demonstrated an understanding of the instructions.   The patient was advised to call back or seek an in-person evaluation if the symptoms worsen or if the condition fails to improve as anticipated.  Time spent 15 minutes  Delorise Jackson, MD

## 2018-08-31 ENCOUNTER — Encounter: Payer: Self-pay | Admitting: Internal Medicine

## 2018-08-31 ENCOUNTER — Other Ambulatory Visit: Payer: Self-pay

## 2018-08-31 ENCOUNTER — Other Ambulatory Visit (INDEPENDENT_AMBULATORY_CARE_PROVIDER_SITE_OTHER): Payer: No Typology Code available for payment source

## 2018-08-31 DIAGNOSIS — E118 Type 2 diabetes mellitus with unspecified complications: Secondary | ICD-10-CM | POA: Insufficient documentation

## 2018-08-31 DIAGNOSIS — R319 Hematuria, unspecified: Secondary | ICD-10-CM

## 2018-08-31 DIAGNOSIS — Z1329 Encounter for screening for other suspected endocrine disorder: Secondary | ICD-10-CM

## 2018-08-31 DIAGNOSIS — I10 Essential (primary) hypertension: Secondary | ICD-10-CM

## 2018-08-31 DIAGNOSIS — E119 Type 2 diabetes mellitus without complications: Secondary | ICD-10-CM | POA: Insufficient documentation

## 2018-08-31 DIAGNOSIS — E559 Vitamin D deficiency, unspecified: Secondary | ICD-10-CM | POA: Insufficient documentation

## 2018-08-31 DIAGNOSIS — R7303 Prediabetes: Secondary | ICD-10-CM

## 2018-08-31 LAB — CBC WITH DIFFERENTIAL/PLATELET
Basophils Absolute: 0.1 10*3/uL (ref 0.0–0.1)
Basophils Relative: 0.7 % (ref 0.0–3.0)
Eosinophils Absolute: 0.1 10*3/uL (ref 0.0–0.7)
Eosinophils Relative: 1.5 % (ref 0.0–5.0)
HCT: 40.8 % (ref 36.0–46.0)
Hemoglobin: 13.6 g/dL (ref 12.0–15.0)
Lymphocytes Relative: 36.3 % (ref 12.0–46.0)
Lymphs Abs: 3.1 10*3/uL (ref 0.7–4.0)
MCHC: 33.4 g/dL (ref 30.0–36.0)
MCV: 89.2 fl (ref 78.0–100.0)
Monocytes Absolute: 0.5 10*3/uL (ref 0.1–1.0)
Monocytes Relative: 6.5 % (ref 3.0–12.0)
Neutro Abs: 4.6 10*3/uL (ref 1.4–7.7)
Neutrophils Relative %: 55 % (ref 43.0–77.0)
Platelets: 289 10*3/uL (ref 150.0–400.0)
RBC: 4.57 Mil/uL (ref 3.87–5.11)
RDW: 13.8 % (ref 11.5–15.5)
WBC: 8.4 10*3/uL (ref 4.0–10.5)

## 2018-08-31 LAB — LIPID PANEL
Cholesterol: 154 mg/dL (ref 0–200)
HDL: 67.3 mg/dL (ref >39.00)
LDL Cholesterol: 52 mg/dL (ref 0–99)
NonHDL: 87.05
Total CHOL/HDL Ratio: 2
Triglycerides: 173 mg/dL — ABNORMAL HIGH (ref 0.0–149.0)
VLDL: 34.6 mg/dL (ref 0.0–40.0)

## 2018-08-31 LAB — COMPREHENSIVE METABOLIC PANEL
ALT: 17 U/L (ref 0–35)
AST: 17 U/L (ref 0–37)
Albumin: 4 g/dL (ref 3.5–5.2)
Alkaline Phosphatase: 88 U/L (ref 39–117)
BUN: 14 mg/dL (ref 6–23)
CO2: 27 mEq/L (ref 19–32)
Calcium: 9 mg/dL (ref 8.4–10.5)
Chloride: 100 mEq/L (ref 96–112)
Creatinine, Ser: 0.59 mg/dL (ref 0.40–1.20)
GFR: 104.41 mL/min (ref >60.00)
Glucose, Bld: 101 mg/dL — ABNORMAL HIGH (ref 70–99)
Potassium: 3.8 mEq/L (ref 3.5–5.1)
Sodium: 137 mEq/L (ref 135–145)
Total Bilirubin: 0.3 mg/dL (ref 0.2–1.2)
Total Protein: 6.5 g/dL (ref 6.0–8.3)

## 2018-08-31 LAB — VITAMIN D 25 HYDROXY (VIT D DEFICIENCY, FRACTURES): VITD: 19.93 ng/mL — ABNORMAL LOW (ref 30.00–100.00)

## 2018-08-31 LAB — HEMOGLOBIN A1C: Hgb A1c MFr Bld: 6.8 % — ABNORMAL HIGH (ref 4.6–6.5)

## 2018-08-31 LAB — TSH: TSH: 3.14 u[IU]/mL (ref 0.35–4.50)

## 2018-09-01 ENCOUNTER — Other Ambulatory Visit: Payer: Self-pay | Admitting: Internal Medicine

## 2018-09-01 ENCOUNTER — Telehealth: Payer: Self-pay | Admitting: Obstetrics and Gynecology

## 2018-09-01 ENCOUNTER — Encounter: Payer: Self-pay | Admitting: Internal Medicine

## 2018-09-01 DIAGNOSIS — J309 Allergic rhinitis, unspecified: Secondary | ICD-10-CM

## 2018-09-01 DIAGNOSIS — R319 Hematuria, unspecified: Secondary | ICD-10-CM

## 2018-09-01 LAB — URINALYSIS, ROUTINE W REFLEX MICROSCOPIC
Bacteria, UA: NONE SEEN /HPF
Bilirubin Urine: NEGATIVE
Glucose, UA: NEGATIVE
Hyaline Cast: NONE SEEN /LPF
Ketones, ur: NEGATIVE
Leukocytes,Ua: NEGATIVE
Nitrite: NEGATIVE
Protein, ur: NEGATIVE
RBC / HPF: NONE SEEN /HPF (ref 0–2)
Specific Gravity, Urine: 1.016 (ref 1.001–1.03)
WBC, UA: NONE SEEN /HPF (ref 0–5)
pH: 6.5 (ref 5.0–8.0)

## 2018-09-01 MED ORDER — FEXOFENADINE HCL 180 MG PO TABS: 180.0000 mg | ORAL_TABLET | Freq: Every day | ORAL | 3 refills | Status: DC | PRN

## 2018-09-01 NOTE — Telephone Encounter (Signed)
Patient called requesting a refill on estradiol [facility] in [place]. Thanks

## 2018-09-01 NOTE — Addendum Note (Signed)
Addended by: Leeanne Rio on: 09/01/2018 08:29 AM   Modules accepted: Orders

## 2018-09-03 LAB — URINE CULTURE
MICRO NUMBER:: 763645
SPECIMEN QUALITY:: ADEQUATE

## 2018-09-03 MED ORDER — ESTRADIOL 1 MG PO TABS: 1.0000 mg | ORAL_TABLET | Freq: Every day | ORAL | 3 refills | Status: DC

## 2018-09-03 NOTE — Telephone Encounter (Signed)
Pt is aware that her medication has been sent to the pharmacy.

## 2018-09-16 ENCOUNTER — Other Ambulatory Visit: Payer: Self-pay | Admitting: Internal Medicine

## 2018-09-16 DIAGNOSIS — L299 Pruritus, unspecified: Secondary | ICD-10-CM

## 2018-09-21 ENCOUNTER — Telehealth: Payer: Self-pay

## 2018-09-21 ENCOUNTER — Ambulatory Visit (INDEPENDENT_AMBULATORY_CARE_PROVIDER_SITE_OTHER): Payer: No Typology Code available for payment source | Admitting: Family Medicine

## 2018-09-21 ENCOUNTER — Other Ambulatory Visit: Payer: Self-pay

## 2018-09-21 DIAGNOSIS — L209 Atopic dermatitis, unspecified: Secondary | ICD-10-CM

## 2018-09-21 NOTE — Telephone Encounter (Signed)
Copied from [place] [id number]. Topic: General - Inquiry >> Sep 21, 2018 10:06 AM Scherrie Gerlach wrote: Reason for CRM: pt states the rash she had on 8/05 is back, and she thinks may be shingles.  Painful to touch and itchy. On buttocks, raps around to front of body to left hip. Also one on knee,behind knee, one on right and left foot. Pt went to dermatologists, and does not feel the diagnosed correctly.   Please advise

## 2018-09-21 NOTE — Telephone Encounter (Signed)
Copied from [place] [id number]. Topic: General - Inquiry >> Sep 21, 2018 10:06 AM Scherrie Gerlach wrote: Reason for CRM: pt states the rash she had on 8/05 is back, and she thinks may be shingles.  Painful to touch and itchy. On buttocks, raps around to front of body to left hip. Also one on knee,behind knee, one on right and left foot. Pt went to dermatologists, and does not feel the diagnosed correctly.   Please advise

## 2018-09-21 NOTE — Telephone Encounter (Signed)
Patient scheduled virtual visit w/ Philis Nettle, NP today @ 3:00 pm.

## 2018-09-21 NOTE — Progress Notes (Signed)
Patient ID: Kristin [name], female   DOB: 1960/06/09, 58 y.o.   MRN: [id number]    Virtual Visit via video Note  This visit type was conducted due to national recommendations for restrictions regarding the COVID-19 pandemic (e.g. social distancing).  This format is felt to be most appropriate for this patient at this time.  All issues noted in this document were discussed and addressed.  No physical exam was performed (except for noted visual exam findings with Video Visits).   I connected with Kristin [name] today at  3:00 PM EDT by a video enabled telemedicine application and verified that I am speaking with the correct person using two identifiers. Location patient: home Location provider: work or home office Persons participating in the virtual visit: patient, provider  I discussed the limitations, risks, security and privacy concerns of performing an evaluation and management service by video and the availability of in person appointments. I also discussed with the patient that there may be a patient responsible charge related to this service. The patient expressed understanding and agreed to proceed.   HPI:  Patient and I connected via video due to rash.  Patient has scattered rash on torso, bilateral legs and sometimes on forearms.  Patient states rash will come and go.  She has been dealing with this rash off and on for months.  At first it was thought to be from spider bites.  Then it was thought to be an allergic reaction to an antibiotic.  She did have an appointment with dermatology, they were unable to officially diagnose the rash.  Patient was allergy tested, and was found not to be allergic to anything in the allergy test.  She does have upcoming medication allergy test for October 2020.  States at times the rash is itchy, other times it is just red and raised and does not itch.  Denies any changes in soaps, detergents, lotions or foods.  Patient does take Allegra 180 mg  every day.  States a friend suggested she could be having shingles outbreaks, patient wondering if that is possible.    ROS: See pertinent positives and negatives per HPI.  Past Medical History:  Diagnosis Date  . Allergy   . Arthritis   . Basal cell carcinoma    face, back, neck, arm  . Basal cell carcinoma    nose, left cheek  . Chicken pox   . Complication of anesthesia    blood pressure elevated after gastric sleeve surgery in recovery, pt does not want mask over face  . Depression   . Diabetes mellitus without complication ([place])    pre-diabetic currently, was diabetic prior to gastric sleeve surgery   . Fatty liver   . GERD (gastroesophageal reflux disease)   . Gout   . History of shingles   . Hyperlipidemia   . Hypertension    no problems since losing weight after gastric sleeve surgery  . Insomnia   . Migraine   . Obesity   . Osteopenia     Past Surgical History:  Procedure Laterality Date  . ABDOMINAL HYSTERECTOMY    . ANKLE ARTHROSCOPY Left 05/29/2016   Procedure: ANKLE ARTHROSCOPY;  Surgeon: [name], DPM;  Location: [facility];  Service: Podiatry;  Laterality: Left;  . APPENDECTOMY    . basal cell removed    . [place]  . CHOLECYSTECTOMY    . cyst removed from wrist    . FOOT SURGERY  left foot 11/2016 Dr. Laymond Purser   . left elbow surgery     07/2017 Dr Leanor Kail epicondylitis   . MOHS SURGERY     x 2 face nose and left cheek   . OSTEOCHONDROMA EXCISION Left 05/29/2016   Procedure: OSTEOCHONDRAL DRILLING TALUS;  Surgeon: [name], DPM;  Location: [facility];  Service: Podiatry;  Laterality: Left;  . PLANTAR FASCIA RELEASE Left 05/29/2016   Procedure: ENDOSCOPIC PLANTAR FASCIOTOMY;  Surgeon: [name], DPM;  Location: [facility];  Service: Podiatry;  Laterality: Left;  . ROTATOR CUFF REPAIR     lt shoulder  . STOMACH SURGERY  2017   gastric sleeve surgery  . TENNIS ELBOW RELEASE/NIRSCHEL PROCEDURE Left 08/05/2017    Procedure: TENNIS ELBOW RELEASE/NIRSCHEL PROCEDURE;  Surgeon: Leanor Kail, MD;  Location: [facility];  Service: Orthopedics;  Laterality: Left;  . TUBAL LIGATION    . wedge resection of ovary  1982    Family History  Problem Relation Age of Onset  . Heart disease Mother   . Breast cancer Mother 64       metastatic, now hip, spine, and leg   . Arthritis Mother   . Hyperlipidemia Mother   . Cancer Mother        spinal surgery. breast dx'ed age 17 now metastatic 71 as of 07/13/18  . Cystic fibrosis Mother        carrier   . Heart disease Father   . Lung cancer Father   . Hyperlipidemia Father   . Hypertension Father   . Diabetes Sister   . Diabetes Maternal Aunt   . Diabetes Maternal Uncle   . Diabetes Maternal Grandfather   . Hyperlipidemia Maternal Grandfather   . Arthritis Maternal Grandmother   . Sudden death Cousin   . Diabetes Maternal Aunt   . Diabetes Sister   . Cystic fibrosis Other        great niece   . Colon cancer Neg Hx   . Ovarian cancer Neg Hx    Social History   Tobacco Use  . Smoking status: Never Smoker  . Smokeless tobacco: Never Used  Substance Use Topics  . Alcohol use: Never    Frequency: Never    Current Outpatient Medications:  .  amLODipine (NORVASC) 2.5 MG tablet, Take 1 tablet (2.5 mg total) by mouth daily. No need to cut pill in 1/2 take whole pill, Disp: 90 tablet, Rfl: 3 .  celecoxib (CELEBREX) 100 MG capsule, Take by mouth., Disp: , Rfl:  .  clobetasol cream (TEMOVATE) [id number] %, Apply 1 application topically 2 (two) times daily. Prn not on face, Disp: 60 g, Rfl: 0 .  cyclobenzaprine (FLEXERIL) 10 MG tablet, Take 1 tablet (10 mg total) by mouth 2 (two) times daily as needed for muscle spasms., Disp: 30 tablet, Rfl: 0 .  estradiol (ESTRACE) 1 MG tablet, Take 1 tablet (1 mg total) by mouth daily., Disp: 90 tablet, Rfl: 3 .  fexofenadine (ALLEGRA) 180 MG tablet, Take 1 tablet (180 mg total) by mouth daily as needed for allergies or  rhinitis., Disp: 90 tablet, Rfl: 3 .  FLUoxetine (PROZAC) 40 MG capsule, Take 1 capsule (40 mg total) by mouth daily., Disp: 90 capsule, Rfl: 3 .  Galcanezumab-gnlm (EMGALITY) 120 MG/ML SOAJ, Inject 120 mg into the skin every 30 (thirty) days., Disp: 1 pen, Rfl: 11 .  hydrOXYzine (ATARAX/VISTARIL) 10 MG tablet, TAKE 0.5-1 TABLETS (5-10 MG TOTAL) BY MOUTH 3 (THREE) TIMES DAILY AS NEEDED  FOR ITCHING., Disp: 270 tablet, Rfl: 3 .  methylPREDNISolone (MEDROL DOSEPAK) 4 MG TBPK tablet, Take according to pack instructions, Disp: 21 tablet, Rfl: 0 .  neomycin-polymyxin-hydrocortisone (CORTISPORIN) OTIC solution, Place 4 drops into the left ear 4 (four) times daily. X 7 days, Disp: 10 mL, Rfl: 0 .  omeprazole (PRILOSEC) 20 MG capsule, Take 20 mg by mouth daily as needed (indigestion)., Disp: , Rfl:  .  ondansetron (ZOFRAN-ODT) 4 MG disintegrating tablet, Take 1 tablet (4 mg total) by mouth every 8 (eight) hours as needed for nausea., Disp: 30 tablet, Rfl: 3 .  oxyCODONE-acetaminophen (PERCOCET) 5-325 MG tablet, Take 1 tablet by mouth 2 (two) times daily as needed for severe pain., Disp: 10 tablet, Rfl: 0 .  rizatriptan (MAXALT-MLT) 10 MG disintegrating tablet, Take 1 tablet (10 mg total) by mouth as needed for migraine. May repeat in 2 hours if needed, Disp: 10 tablet, Rfl: 11 .  rosuvastatin (CRESTOR) 20 MG tablet, Take 1 tablet (20 mg total) by mouth at bedtime., Disp: 90 tablet, Rfl: 3 .  zolpidem (AMBIEN CR) 12.5 MG CR tablet, Take 1 tablet (12.5 mg total) by mouth at bedtime as needed., Disp: 30 tablet, Rfl: 2 .  diazepam (VALIUM) 5 MG tablet, Take 1 tablet (5 mg total) by mouth as needed for anxiety. 30 minutes prior to MRI. Repeat another dose 15 min before MRI, Disp: 4 tablet, Rfl: 1 .  predniSONE (DELTASONE) 20 MG tablet, Take 1 tablet (20 mg total) by mouth daily with breakfast., Disp: 7 tablet, Rfl: 0  EXAM:  GENERAL: alert, oriented, appears well and in no acute distress  HEENT: atraumatic,  conjunttiva clear, no obvious abnormalities on inspection of external nose and ears  NECK: normal movements of the head and neck  LUNGS: on inspection no signs of respiratory distress, breathing rate appears normal, no obvious gross SOB, gasping or wheezing  CV: no obvious cyanosis  SKIN: Patient is able to angled camera down to leg to show me patch of the rash.  Skin patch does appear dry, a little red and slightly raised.  Rash area does not look consistent with a shingles outbreak.  MS: moves all visible extremities without noticeable abnormality  PSYCH/NEURO: pleasant and cooperative, no obvious depression or anxiety, speech and thought processing grossly intact  ASSESSMENT AND PLAN:  Discussed the following assessment and plan:  Dermatitis - suspect patient's rash is from some sort of atopic dermatitis of unknown cause.  Long discussion with patient in regards to the fact that we may never be able to officially determine the exact cause of the rash.  Advised to increase Allegra to 180 mg twice daily for at least the next 2 weeks and monitor skin to see how rash responds.  Advised when the rash is not as bothersome and not itchy, she can take Allegra just once a day, but if she starts to have more rash outbreaks and more itching she can increase to twice a day.  Advised patient that I do not feel the rash is related to shingles however if she did want to try a course of Valtrex we could do that and see how she responds.  Patient will do Allegra increase first and let us know how she is doing.   I discussed the assessment and treatment plan with the patient. The patient was provided an opportunity to ask questions and all were answered. The patient agreed with the plan and demonstrated an understanding of the instructions.   The  patient was advised to call back or seek an in-person evaluation if the symptoms worsen or if the condition fails to improve as anticipated.   15 minutes spent  on the video call with patient  Jodelle Green, FNP

## 2018-09-22 ENCOUNTER — Telehealth: Payer: Self-pay | Admitting: *Deleted

## 2018-09-22 NOTE — Telephone Encounter (Signed)
This is ok   [place]

## 2018-09-22 NOTE — Telephone Encounter (Signed)
Copied from [place] [phone]. Topic: General - Other >> Sep 22, 2018 12:09 PM Leward Quan A wrote: Reason for CRM: Patient called to say that she will be in [place] on date of appointment she is asking can it then be a virtual visit since first available after that is 11/17/2018. Patient is helping to care for her elderly mother so she is back and forth to [place]. Please call patient at Ph# [phone]

## 2018-09-23 NOTE — Telephone Encounter (Signed)
appointment has been updated.

## 2018-10-11 ENCOUNTER — Encounter: Payer: Self-pay | Admitting: Internal Medicine

## 2018-10-14 ENCOUNTER — Other Ambulatory Visit: Payer: Self-pay

## 2018-10-14 ENCOUNTER — Ambulatory Visit: Payer: No Typology Code available for payment source | Admitting: Internal Medicine

## 2018-10-14 ENCOUNTER — Encounter

## 2018-10-14 ENCOUNTER — Ambulatory Visit (INDEPENDENT_AMBULATORY_CARE_PROVIDER_SITE_OTHER): Payer: No Typology Code available for payment source | Admitting: Internal Medicine

## 2018-10-14 VITALS — Ht 63.0 in | Wt 165.0 lb

## 2018-10-14 DIAGNOSIS — G43711 Chronic migraine without aura, intractable, with status migrainosus: Secondary | ICD-10-CM

## 2018-10-14 DIAGNOSIS — R319 Hematuria, unspecified: Secondary | ICD-10-CM | POA: Diagnosis not present

## 2018-10-14 DIAGNOSIS — I10 Essential (primary) hypertension: Secondary | ICD-10-CM

## 2018-10-14 DIAGNOSIS — G8929 Other chronic pain: Secondary | ICD-10-CM

## 2018-10-14 DIAGNOSIS — E785 Hyperlipidemia, unspecified: Secondary | ICD-10-CM

## 2018-10-14 DIAGNOSIS — G4700 Insomnia, unspecified: Secondary | ICD-10-CM | POA: Diagnosis not present

## 2018-10-14 DIAGNOSIS — E1165 Type 2 diabetes mellitus with hyperglycemia: Secondary | ICD-10-CM

## 2018-10-14 DIAGNOSIS — R51 Headache: Secondary | ICD-10-CM

## 2018-10-14 DIAGNOSIS — R519 Headache, unspecified: Secondary | ICD-10-CM

## 2018-10-14 MED ORDER — ZOLPIDEM TARTRATE ER 12.5 MG PO TBCR: 12.5000 mg | EXTENDED_RELEASE_TABLET | Freq: Every evening | ORAL | 5 refills | Status: DC | PRN

## 2018-10-14 NOTE — Progress Notes (Addendum)
Virtual Visit via Video Note  I connected with Kristin [name] on 10/15/18 at  3:45 PM EDT by a video enabled telemedicine application and verified that I am speaking with the correct person using two identifiers.  Location patient: sisters home in [place] or home office Persons participating in the virtual visit: patient, provider  I discussed the limitations of evaluation and management by telemedicine and the availability of in person appointments. The patient expressed understanding and agreed to proceed.   HPI: 1. HTN on norvasc 2.5 had not checked today but normally 130s-140s  2. Neck pain resovled she is not having to take nsaids did not doo PT   3. Insomnia chronic needs refill ambien   4. Migraines were improved on Emgality but having h/a increased at the end on the month when injection wearing off will let neurology know  5. A1C 6.8=DM 2 declines medication for now wants to try diet   6. C/o joint pain in hands and swelling stiffness seeing Dr. Jefm Bryant who has her on celebrex but considering Plaquenil she is on anti inflammatory diet which she just started. Also reviewed Tumeric capsules   7. Hematuria no h/o kidney stones no sxs uti and urine culture 09/01/18 negative urine does appear darker than normal per pt   ROS: See pertinent positives and negatives per HPI.  Past Medical History:  Diagnosis Date  . Allergy   . Arthritis   . Basal cell carcinoma    face, back, neck, arm  . Basal cell carcinoma    nose, left cheek  . Chicken pox   . Complication of anesthesia    blood pressure elevated after gastric sleeve surgery in recovery, pt does not want mask over face  . Depression   . Diabetes mellitus without complication ([place])    pre-diabetic currently, was diabetic prior to gastric sleeve surgery   . Fatty liver   . GERD (gastroesophageal reflux disease)   . Gout   . History of shingles   . Hyperlipidemia   . Hypertension    no problems  since losing weight after gastric sleeve surgery  . Insomnia   . Migraine   . Obesity   . Osteopenia     Past Surgical History:  Procedure Laterality Date  . ABDOMINAL HYSTERECTOMY     no h/o abnormal pap still has 1 ovary left  . ANKLE ARTHROSCOPY Left 05/29/2016   Procedure: ANKLE ARTHROSCOPY;  Surgeon: [name], DPM;  Location: [facility];  Service: Podiatry;  Laterality: Left;  . APPENDECTOMY    . basal cell removed    . [place]  . CHOLECYSTECTOMY    . cyst removed from wrist    . FOOT SURGERY     left foot 11/2016 Dr. Laymond Purser   . left elbow surgery     07/2017 Dr Leanor Kail epicondylitis   . MOHS SURGERY     x 2 face nose and left cheek   . OSTEOCHONDROMA EXCISION Left 05/29/2016   Procedure: OSTEOCHONDRAL DRILLING TALUS;  Surgeon: [name], DPM;  Location: [facility];  Service: Podiatry;  Laterality: Left;  . PLANTAR FASCIA RELEASE Left 05/29/2016   Procedure: ENDOSCOPIC PLANTAR FASCIOTOMY;  Surgeon: [name], DPM;  Location: [facility];  Service: Podiatry;  Laterality: Left;  . ROTATOR CUFF REPAIR     lt shoulder  . STOMACH SURGERY  2017   gastric sleeve surgery  . TENNIS ELBOW RELEASE/NIRSCHEL  PROCEDURE Left 08/05/2017   Procedure: TENNIS ELBOW RELEASE/NIRSCHEL PROCEDURE;  Surgeon: Leanor Kail, MD;  Location: [facility];  Service: Orthopedics;  Laterality: Left;  . TUBAL LIGATION    . wedge resection of ovary  1982    Family History  Problem Relation Age of Onset  . Heart disease Mother   . Breast cancer Mother 14       metastatic, now hip, spine, and leg   . Arthritis Mother   . Hyperlipidemia Mother   . Cancer Mother        spinal surgery. breast dx'ed age 36 now metastatic 5 as of 07/13/18  . Cystic fibrosis Mother        carrier   . Heart disease Father   . Lung cancer Father   . Hyperlipidemia Father   . Hypertension Father   . Diabetes Sister   . Diabetes Maternal Aunt   . Diabetes Maternal Uncle   . Diabetes  Maternal Grandfather   . Hyperlipidemia Maternal Grandfather   . Arthritis Maternal Grandmother   . Sudden death Cousin   . Diabetes Maternal Aunt   . Diabetes Sister   . Cystic fibrosis Other        great niece   . Colon cancer Neg Hx   . Ovarian cancer Neg Hx     SOCIAL HX:   Married x 37 years as of 10/2017  Kids 3 boys youngest is Eddie Dibbles Write childrens books  Retired from [employer] 01/2017  Enjoys traveling  Right handed Caffeine: quit 2009  Current Outpatient Medications:  .  amLODipine (NORVASC) 2.5 MG tablet, Take 1 tablet (2.5 mg total) by mouth daily. No need to cut pill in 1/2 take whole pill, Disp: 90 tablet, Rfl: 3 .  celecoxib (CELEBREX) 100 MG capsule, Take by mouth., Disp: , Rfl:  .  clobetasol cream (TEMOVATE) 6.76 %, Apply 1 application topically 2 (two) times daily. Prn not on face, Disp: 60 g, Rfl: 0 .  cyclobenzaprine (FLEXERIL) 10 MG tablet, Take 1 tablet (10 mg total) by mouth 2 (two) times daily as needed for muscle spasms., Disp: 30 tablet, Rfl: 0 .  diazepam (VALIUM) 5 MG tablet, Take 1 tablet (5 mg total) by mouth as needed for anxiety. 30 minutes prior to MRI. Repeat another dose 15 min before MRI, Disp: 4 tablet, Rfl: 1 .  estradiol (ESTRACE) 1 MG tablet, Take 1 tablet (1 mg total) by mouth daily., Disp: 90 tablet, Rfl: 3 .  fexofenadine (ALLEGRA) 180 MG tablet, Take 1 tablet (180 mg total) by mouth daily as needed for allergies or rhinitis., Disp: 90 tablet, Rfl: 3 .  FLUoxetine (PROZAC) 40 MG capsule, Take 1 capsule (40 mg total) by mouth daily., Disp: 90 capsule, Rfl: 3 .  Galcanezumab-gnlm (EMGALITY) 120 MG/ML SOAJ, Inject 120 mg into the skin every 30 (thirty) days., Disp: 1 pen, Rfl: 11 .  hydrOXYzine (ATARAX/VISTARIL) 10 MG tablet, TAKE 0.5-1 TABLETS (5-10 MG TOTAL) BY MOUTH 3 (THREE) TIMES DAILY AS NEEDED FOR ITCHING., Disp: 270 tablet, Rfl: 3 .  methylPREDNISolone (MEDROL DOSEPAK) 4 MG TBPK tablet, Take according to pack instructions, Disp: 21 tablet,  Rfl: 0 .  neomycin-polymyxin-hydrocortisone (CORTISPORIN) OTIC solution, Place 4 drops into the left ear 4 (four) times daily. X 7 days, Disp: 10 mL, Rfl: 0 .  omeprazole (PRILOSEC) 20 MG capsule, Take 20 mg by mouth daily as needed (indigestion)., Disp: , Rfl:  .  ondansetron (ZOFRAN-ODT) 4 MG disintegrating tablet, Take 1 tablet (4 mg  total) by mouth every 8 (eight) hours as needed for nausea., Disp: 30 tablet, Rfl: 3 .  oxyCODONE-acetaminophen (PERCOCET) 5-325 MG tablet, Take 1 tablet by mouth 2 (two) times daily as needed for severe pain., Disp: 10 tablet, Rfl: 0 .  predniSONE (DELTASONE) 20 MG tablet, Take 1 tablet (20 mg total) by mouth daily with breakfast., Disp: 7 tablet, Rfl: 0 .  rizatriptan (MAXALT-MLT) 10 MG disintegrating tablet, Take 1 tablet (10 mg total) by mouth as needed for migraine. May repeat in 2 hours if needed, Disp: 10 tablet, Rfl: 11 .  rosuvastatin (CRESTOR) 20 MG tablet, Take 1 tablet (20 mg total) by mouth at bedtime., Disp: 90 tablet, Rfl: 3 .  zolpidem (AMBIEN CR) 12.5 MG CR tablet, Take 1 tablet (12.5 mg total) by mouth at bedtime as needed., Disp: 30 tablet, Rfl: 5  EXAM:  VITALS per patient if applicable:  GENERAL: alert, oriented, appears well and in no acute distress  HEENT: atraumatic, conjunttiva clear, no obvious abnormalities on inspection of external nose and ears  NECK: normal movements of the head and neck  LUNGS: on inspection no signs of respiratory distress, breathing rate appears normal, no obvious gross SOB, gasping or wheezing  CV: no obvious cyanosis  MS: moves all visible extremities without noticeable abnormality  PSYCH/NEURO: pleasant and cooperative, no obvious depression or anxiety, speech and thought processing grossly intact  ASSESSMENT AND PLAN:  Discussed the following assessment and plan:  Hematuria, unspecified type - Plan: Urinalysis, Routine w reflex microscopic If still + blood CT renal and consider urology    Insomnia, unspecified type - Plan: zolpidem (AMBIEN CR) 12.5 MG CR tablet  Essential hypertension -monitor bp  Cont meds for now   Hyperlipidemia, unspecified hyperlipidemia type -rec healthy diet and exercise   Chronic intractable headache, unspecified headache type Chronic migraine without aura, with intractable migraine, so stated, with status migrainosus -f/u neurology   Type 2 diabetes mellitus with hyperglycemia, without long-term current use of insulin (HCC) A1C 6.8  -declines meds for now  -will repeat A1C in future   HM Flu shot needed 2020 will get week of 10/18/18  utdTdap  Due pna 23 vaccine  Disc shingrixprev hep A vaccine1/2 2nd dose due asap 11/05/17-05/07/2018, hep B immunewith h/o fatty liver Hep C neg MMRimmune A1C check 6.8 09/01/18  Never smoker, chew congratulatednot doing either mammoneg7/02/2018 normal Colonoscopy -obtained records 07/15/11 colonoscopy Dr. Candace Cruise nl repeat in 10 years  Pap s/p hysterectomy no h/o abnormal pap 1 ovary left due to h/o ovarian tumor in 1982 hysterectomy 2/2 fibroids  [facility]  appt Dr. Enzo Bi 02/10/17, then Dr. Marcelline Mates  -pap not needed any longer per ob/gyn and she wishes not to f/u with them as of 10/14/18   Dermatology sees [facility] q6 months h/o BCC nose and left cheek s/p Mohs and h/o Aks  Of not she saw [facility] late March 2019 and had eye glasses checked  rheumatology 09/02/18 Dr. Jefm Bryant pain in hands and feet  NS-Dr. Daun Peacock seen 08/26/18 f/u prn  Allergist in [place] and [place] will f/u 10/2018   ENT appt 12/28/18 Dr. Richardson Landry rhinitis, chronic and h/a singulair 10 mg qd Ct deviated septum but no sig sinus dz mild right maxillary mucosal thickening BP 132/72   -we discussed possible serious and likely etiologies, options for evaluation and workup, limitations of telemedicine visit vs in person visit, treatment, treatment risks and precautions. Pt prefers to treat  via telemedicine empirically rather then risking  or undertaking an in person visit at this moment. Patient agrees to seek prompt in person care if worsening, new symptoms arise, or if is not improving with treatment.   I discussed the assessment and treatment plan with the patient. The patient was provided an opportunity to ask questions and all were answered. The patient agreed with the plan and demonstrated an understanding of the instructions.   The patient was advised to call back or seek an in-person evaluation if the symptoms worsen or if the condition fails to improve as anticipated.  Time spent 25 minutes  Delorise Jackson, MD

## 2018-10-15 ENCOUNTER — Encounter: Payer: Self-pay | Admitting: Internal Medicine

## 2018-10-15 DIAGNOSIS — R319 Hematuria, unspecified: Secondary | ICD-10-CM | POA: Insufficient documentation

## 2018-10-15 HISTORY — DX: Hematuria, unspecified: R31.9

## 2018-10-21 ENCOUNTER — Other Ambulatory Visit (INDEPENDENT_AMBULATORY_CARE_PROVIDER_SITE_OTHER): Payer: No Typology Code available for payment source

## 2018-10-21 ENCOUNTER — Other Ambulatory Visit: Payer: Self-pay

## 2018-10-21 DIAGNOSIS — Z23 Encounter for immunization: Secondary | ICD-10-CM

## 2018-10-21 DIAGNOSIS — E1165 Type 2 diabetes mellitus with hyperglycemia: Secondary | ICD-10-CM

## 2018-10-21 DIAGNOSIS — R319 Hematuria, unspecified: Secondary | ICD-10-CM

## 2018-10-22 ENCOUNTER — Telehealth: Payer: Self-pay

## 2018-10-22 ENCOUNTER — Encounter: Payer: Self-pay | Admitting: Internal Medicine

## 2018-10-22 LAB — URINALYSIS, ROUTINE W REFLEX MICROSCOPIC
Bacteria, UA: NONE SEEN /HPF
Bilirubin Urine: NEGATIVE
Glucose, UA: NEGATIVE
Hyaline Cast: NONE SEEN /LPF
Ketones, ur: NEGATIVE
Leukocytes,Ua: NEGATIVE
Nitrite: NEGATIVE
Protein, ur: NEGATIVE
RBC / HPF: NONE SEEN /HPF (ref 0–2)
Specific Gravity, Urine: 1.006 (ref 1.001–1.03)
Squamous Epithelial / HPF: NONE SEEN /HPF (ref <=5)
WBC, UA: NONE SEEN /HPF (ref 0–5)
pH: 6.5 (ref 5.0–8.0)

## 2018-10-22 LAB — MICROALBUMIN / CREATININE URINE RATIO
Creatinine, Urine: 29 mg/dL (ref 20–275)
Microalb Creat Ratio: 10 mcg/mg creat (ref <30)
Microalb, Ur: 0.3 mg/dL

## 2018-10-22 NOTE — Telephone Encounter (Signed)
Copied from [place] [id number]. Topic: General - Other >> Oct 22, 2018  9:11 AM Antonieta Iba C wrote: Reason for CRM: pt called in to update provider. Pt says that her BP reading last night was 176/80, this morning it is: 145/77. Pt says that she had a headache lastnight which is why she checked her reading.

## 2018-10-22 NOTE — Telephone Encounter (Signed)
Called and spoke to patient.  Patient bp reading last night was 176/80, this morning before meds bp reading was 145/77.  Patient said that she took her bp meds about 1 hour ago.  Had patient recheck bp while on the phone.  Patient reading from home bp machine was 164/71;  hr 77.  Patient said that she had a terrible headache last night.  Patient said she took rizatriptan last night for headache.   Patient denies having any other symptoms today.  Patient advised to go to urgent care if symptoms worsen.   Notes sent for PCP to review.

## 2018-10-24 ENCOUNTER — Other Ambulatory Visit: Payer: Self-pay | Admitting: Internal Medicine

## 2018-10-24 DIAGNOSIS — R319 Hematuria, unspecified: Secondary | ICD-10-CM

## 2018-10-24 DIAGNOSIS — I10 Essential (primary) hypertension: Secondary | ICD-10-CM

## 2018-10-24 MED ORDER — LOSARTAN POTASSIUM 25 MG PO TABS: 25.0000 mg | ORAL_TABLET | Freq: Every day | ORAL | 3 refills | Status: DC

## 2018-10-24 MED ORDER — PROPRANOLOL HCL ER 60 MG PO CP24: 60.0000 mg | ORAL_CAPSULE | Freq: Every day | ORAL | 3 refills | Status: DC

## 2018-10-24 NOTE — Telephone Encounter (Signed)
See my chart response to patient and call patient    Will double norvasc to 5 mg daily  Or  Add losartan 25 mg daily   Or  Add back propranolol LA 60 mg daily to Norvasc 2.5 mg daily    Let me know which she does she will need to pick up losartan 25 mg or propranolol LA 60 mg if she does not increased norvasc to 5 mg daily   Let me know what BP is in 1 week    TMS

## 2018-10-26 ENCOUNTER — Other Ambulatory Visit: Payer: Self-pay | Admitting: Internal Medicine

## 2018-10-26 DIAGNOSIS — I10 Essential (primary) hypertension: Secondary | ICD-10-CM

## 2018-10-26 MED ORDER — AMLODIPINE BESYLATE 5 MG PO TABS: 5.0000 mg | ORAL_TABLET | Freq: Every day | ORAL | 3 refills | Status: DC

## 2018-10-26 NOTE — Telephone Encounter (Signed)
Left message for patient to return call back. P[facility] may give and obtain information.  

## 2018-11-07 NOTE — Progress Notes (Addendum)
PATIENT: Kristin [name] DOB: [birth year]  REASON FOR VISIT: follow up HISTORY FROM: patient  Chief Complaint  Patient presents with   Follow-up    Room 5, alone. Migraine "Doing well when first starting emgality, now they are coming back."     HISTORY OF PRESENT ILLNESS: Today 11/08/18 Kristin [name] is a 58 y.o. female here today for follow up of migraines. She reports that over the month headaches have returned. She is having headaches more in the evenings. Not generally in the mornings and they do not wake her up. It does seem to be worse when she stands up. Headache is usually behind the left eye. Headache usually last about 30 minutes but can linger for hours or days. She is having about 16-20 headache days a month and reports that more than half of these are migrainous with light/sound sensitivity and nausea. She has continued Emgality monthly as well as Prozac 40 mg daily. She felt that this combo worked well in the beginning but has not been effective recently. She continues rizatriptan and Zofran for abortive therapy. This does seem to help. She is also taking  celecoxib 156m daily and Ambien 12.546mat bedtime. She takes oxycodone/acetaminophen 5-325 rarely for pain. She has not used cyclobenzaprine recently. She reports that blood pressures have been up and down. Readings are between 120/80-177/81. She does have significantly allergies. She does have sinus pressure. She had chronic sinusitis noted on MRI in 10/2017. She does not snore. She does wake with dry mouth. She wakes frequently (1-3 times nightly) to urinate. She can not sleep without taking Ambien. She is "sleep eating." She does not remember eating during sleep. She has gained about 25 pounds in this past month. She reports being diagnosed with sleep apnea about 5 years ago. She had a gastric sleeve shortly afterwards and lost 50 pounds. She was retested and reports no sleep apnea. Unfortunately, she has gained  back 35 pounds. She drinks about 20 ounces of water daily, diet cranberry juice and decaf tea. [place].    HISTORY: (copied from my note on 05/06/2018)  Kristin [name] is a [age].o. female for follow up of migraines. She was started on Emgality and Prozac at her previous visit in 10/2017. She was also taking propranolol at that time but has discontinued this medication due to excessive fatigue and drowsiness.  She has been using rizatriptan and Zofran for abortive therapy.  She reports that she is having 2-3 migraines per month.  She does feel that her medications are helping significantly but is interested in increasing the Prozac dose.  She feels that this helped tremendously with her headaches and stress levels.  She does admit to continued stress and depression.  She is trying to care for her husband and son at home while also caring for her mother who has cancer in [place] She is splitting her time between the 2 homes.  She denies any concerns of suicidal ideations.  History (copied from Dr AhCathren Laineote 11/10/2017)  Interval history 11/10/2017: MR of the brain showed frontal chronic sinusitis, discussed seeing ENT. Propranolol is helping, the headaches are not as bad. She has been tested in the past recently 3 years ago and no sleep apnea. She is under more stress now. Her pulse is 60 cannot increase propranolol further. Discussed options, due to stress may start Prozac which is a migraine preventative and  can help with stress. Zofran for dizziness or nausea. Also start Emgality.   Medications tried: propranolol, topiramate, prozac, nortriptyline, verapamil  Reviewed MRI of the brain images with patient and agree with th following: This MRI of the brain with and without contrast shows the following: 1. Couple punctate T2/FLAIR hyperintense foci in the subcortical white matter. These are also seen on the 2012 MRI. They  are nonspecific but could represent very minimal chronic microvascular ischemic changes or sequela of migraine headaches. 2. Chronic right frontal sinusitis. 3. There are no acute findings and there is a normal enhancement pattern.  [id number] M [name] a 58 y.o.femalehere as requested by Dr. McLean-Scocuzzafor headaches. Migraines in the past was always on the left hand side. She was on Topamax 10 years ago she stopped headaches improved after coming off of caffeine and a massage once a month. She would have occ headaches but not severe not frequent. She started noticing headaches in may and became more severe in September every time she stood up out of the car the headache would be severe, noticed a pattern of worsening headache when standing. No lumbar punctures, no trauma, no new medications, no recent illnesses. She hydrates well. No OTC medication overuse. Mid September the afternoon worsened but lost the positional quality. Now every afternoon she has a headache. Headache is on the top of the head, tightness, worse if she leans over from the throbbing. No light sensitivity, + sound sensitivity. Can wake up with the headaches. Tylenol helps a little bit. Sleeping helps. No other focal neurologic deficits, associated symptoms, inciting events or modifiable factors.  Reviewed notes, labs and imaging from outside physicians, which showed:  Reviewed MRI brain images from 2012 which were normal  Esr/crp nml    REVIEW OF SYSTEMS: Out of a complete 14 system review of symptoms, the patient complains only of the following symptoms, headaches, elevated blood pressure, seasonal allergies, chronic pain, insomnia and all other reviewed systems are negative.   ALLERGIES: Allergies  Allergen Reactions   Tetracyclines & Related Swelling and Rash    FACE SWELL, RASH ON CHEST   Alcohol Rash    ABDOMINAL CRAMPS, drinking alcohol   Augmentin [Amoxicillin-Pot Clavulanate] Rash    Azithromycin Rash   Metoprolol Diarrhea    HOME MEDICATIONS: Outpatient Medications Prior to Visit  Medication Sig Dispense Refill   amLODipine (NORVASC) 5 MG tablet Take 1 tablet (5 mg total) by mouth daily. And stop 2.5 mg daily (2 pills) 90 tablet 3   celecoxib (CELEBREX) 100 MG capsule Take by mouth.     Cholecalciferol (VITAMIN D) 125 MCG (5000 UT) CAPS      estradiol (ESTRACE) 1 MG tablet Take 1 tablet (1 mg total) by mouth daily. 90 tablet 3   fexofenadine (ALLEGRA) 180 MG tablet Take 1 tablet (180 mg total) by mouth daily as needed for allergies or rhinitis. 90 tablet 3   FLUoxetine (PROZAC) 40 MG capsule Take 1 capsule (40 mg total) by mouth daily. 90 capsule 3   Galcanezumab-gnlm (EMGALITY) 120 MG/ML SOAJ Inject 120 mg into the skin every 30 (thirty) days. 1 pen 11   omeprazole (PRILOSEC) 20 MG capsule Take 20 mg by mouth daily as needed (indigestion).     ondansetron (ZOFRAN-ODT) 4 MG disintegrating tablet Take 1 tablet (4 mg total) by mouth every 8 (eight) hours as needed for nausea. 30 tablet 3   rizatriptan (MAXALT-MLT) 10 MG disintegrating tablet Take 1 tablet (10 mg total) by mouth as needed for  migraine. May repeat in 2 hours if needed 10 tablet 11   rosuvastatin (CRESTOR) 20 MG tablet Take 1 tablet (20 mg total) by mouth at bedtime. 90 tablet 3   Turmeric 500 MG CAPS      zolpidem (AMBIEN CR) 12.5 MG CR tablet Take 1 tablet (12.5 mg total) by mouth at bedtime as needed. 30 tablet 5   cyclobenzaprine (FLEXERIL) 10 MG tablet Take 1 tablet (10 mg total) by mouth 2 (two) times daily as needed for muscle spasms. 30 tablet 0   neomycin-polymyxin-hydrocortisone (CORTISPORIN) OTIC solution Place 4 drops into the left ear 4 (four) times daily. X 7 days 10 mL 0   oxyCODONE-acetaminophen (PERCOCET) 5-325 MG tablet Take 1 tablet by mouth 2 (two) times daily as needed for severe pain. 10 tablet 0   clobetasol cream (TEMOVATE) 6.29 % Apply 1 application topically 2 (two)  times daily. Prn not on face 60 g 0   diazepam (VALIUM) 5 MG tablet Take 1 tablet (5 mg total) by mouth as needed for anxiety. 30 minutes prior to MRI. Repeat another dose 15 min before MRI 4 tablet 1   hydrOXYzine (ATARAX/VISTARIL) 10 MG tablet TAKE 0.5-1 TABLETS (5-10 MG TOTAL) BY MOUTH 3 (THREE) TIMES DAILY AS NEEDED FOR ITCHING. 270 tablet 3   methylPREDNISolone (MEDROL DOSEPAK) 4 MG TBPK tablet Take according to pack instructions 21 tablet 0   predniSONE (DELTASONE) 20 MG tablet Take 1 tablet (20 mg total) by mouth daily with breakfast. 7 tablet 0   No facility-administered medications prior to visit.     PAST MEDICAL HISTORY: Past Medical History:  Diagnosis Date   Allergy    Arthritis    Basal cell carcinoma    face, back, neck, arm   Basal cell carcinoma    nose, left cheek   Chicken pox    Complication of anesthesia    blood pressure elevated after gastric sleeve surgery in recovery, pt does not want mask over face   Depression    Diabetes mellitus without complication ([place])    pre-diabetic currently, was diabetic prior to gastric sleeve surgery    Fatty liver    GERD (gastroesophageal reflux disease)    Gout    History of shingles    Hyperlipidemia    Hypertension    no problems since losing weight after gastric sleeve surgery   Insomnia    Migraine    Obesity    Osteopenia     PAST SURGICAL HISTORY: Past Surgical History:  Procedure Laterality Date   ABDOMINAL HYSTERECTOMY     no h/o abnormal pap still has 1 ovary left   ANKLE ARTHROSCOPY Left 05/29/2016   Procedure: ANKLE ARTHROSCOPY;  Surgeon: [name], DPM;  Location: [facility];  Service: Podiatry;  Laterality: Left;   APPENDECTOMY     basal cell removed     [place]     cyst removed from wrist     FOOT SURGERY     left foot 11/2016 Dr. Laymond Purser    left elbow surgery     07/2017 Dr Leanor Kail epicondylitis    MOHS  SURGERY     x 2 face nose and left cheek    OSTEOCHONDROMA EXCISION Left 05/29/2016   Procedure: OSTEOCHONDRAL DRILLING TALUS;  Surgeon: [name], DPM;  Location: [facility];  Service: Podiatry;  Laterality: Left;   PLANTAR FASCIA RELEASE Left 05/29/2016   Procedure: ENDOSCOPIC  PLANTAR FASCIOTOMY;  Surgeon: [name], DPM;  Location: [facility];  Service: Podiatry;  Laterality: Left;   ROTATOR CUFF REPAIR     lt shoulder   STOMACH SURGERY  2017   gastric sleeve surgery   TENNIS ELBOW RELEASE/NIRSCHEL PROCEDURE Left 08/05/2017   Procedure: TENNIS ELBOW RELEASE/NIRSCHEL PROCEDURE;  Surgeon: Leanor Kail, MD;  Location: [facility];  Service: Orthopedics;  Laterality: Left;   TUBAL LIGATION     wedge resection of ovary  1982    FAMILY HISTORY: Family History  Problem Relation Age of Onset   Heart disease Mother    Breast cancer Mother 43       metastatic, now hip, spine, and leg    Arthritis Mother    Hyperlipidemia Mother    Cancer Mother        spinal surgery. breast dx'ed age 32 now metastatic 48 as of 07/13/18   Cystic fibrosis Mother        carrier    Heart disease Father    Lung cancer Father    Hyperlipidemia Father    Hypertension Father    Diabetes Sister    Diabetes Maternal Aunt    Diabetes Maternal Uncle    Diabetes Maternal Grandfather    Hyperlipidemia Maternal Grandfather    Arthritis Maternal Grandmother    Sudden death Cousin    Diabetes Maternal Aunt    Diabetes Sister    Cystic fibrosis Other        great niece    Colon cancer Neg Hx    Ovarian cancer Neg Hx     SOCIAL HISTORY: Social History   Socioeconomic History   Marital status: Married    Spouse name: Not on file   Number of children: 3   Years of education: Not on file   Highest education level: Some college, no degree  Occupational History   Not on file  Social Needs   Financial resource strain: Not on file   Food insecurity    Worry: Not on file     Inability: Not on file   Transportation needs    Medical: Not on file    Non-medical: Not on file  Tobacco Use   Smoking status: Never Smoker   Smokeless tobacco: Never Used  Substance and Sexual Activity   Alcohol use: Never    Frequency: Never   Drug use: Never   Sexual activity: Yes    Partners: Male    Birth control/protection: Surgical  Lifestyle   Physical activity    Days per week: 0 days    Minutes per session: 0 min   Stress: Not on file  Relationships   Social connections    Talks on phone: Not on file    Gets together: Not on file    Attends religious service: Not on file    Active member of club or organization: Not on file    Attends meetings of clubs or organizations: Not on file    Relationship status: Not on file   Intimate partner violence    Fear of current or ex partner: Not on file    Emotionally abused: Not on file    Physically abused: Not on file    Forced sexual activity: Not on file  Other Topics Concern   Not on file  Social History Narrative   Married x 37 years as of 10/2017    Kids 3 boys youngest is Eddie Dibbles   Write childrens books    Retired  from [employer] 01/2017    Enjoys traveling    Right handed   Caffeine: quit 2009      PHYSICAL EXAM  Vitals:   11/08/18 1030  BP: (!) 144/75  Pulse: 70  Temp: 97.7 F (36.5 C)  Weight: 190 lb 6.4 oz (86.4 kg)  Height: 5' 3" (1.6 m)   Body mass index is 33.73 kg/m.  Generalized: Well developed, in no acute distress  Cardiology: normal rate and rhythm, no murmur noted Neurological examination  Mentation: Alert oriented to time, place, history taking. Follows all commands speech and language fluent Cranial nerve II-XII: Pupils were equal round reactive to light. Extraocular movements were full, visual field were full on confrontational test. Facial sensation and strength were normal. Uvula tongue midline. Head turning and shoulder shrug  were normal and symmetric. Motor: The motor  testing reveals 5 over 5 strength of all 4 extremities. Good symmetric motor tone is noted throughout.  Sensory: Sensory testing is intact to soft touch on all 4 extremities. No evidence of extinction is noted.  Coordination: Cerebellar testing reveals good finger-nose-finger and heel-to-shin bilaterally.  Gait and station: Gait is normal.    DIAGNOSTIC DATA (LABS, IMAGING, TESTING) - I reviewed patient records, labs, notes, testing and imaging myself where available.  No flowsheet data found.   Lab Results  Component Value Date   WBC 8.4 08/31/2018   HGB 13.6 08/31/2018   HCT 40.8 08/31/2018   MCV 89.2 08/31/2018   PLT 289.0 08/31/2018      Component Value Date/Time   NA 137 08/31/2018 0837   NA 141 03/02/2017 1014   NA 139 10/24/2012 1218   K 3.8 08/31/2018 0837   K 3.9 10/24/2012 1218   CL 100 08/31/2018 0837   CL 103 10/24/2012 1218   CO2 27 08/31/2018 0837   CO2 29 10/24/2012 1218   GLUCOSE 101 (H) 08/31/2018 0837   GLUCOSE 103 (H) 10/24/2012 1218   BUN 14 08/31/2018 0837   BUN 11 03/02/2017 1014   BUN 12 10/24/2012 1218   CREATININE 0.59 08/31/2018 0837   CREATININE 0.67 10/24/2012 1218   CALCIUM 9.0 08/31/2018 0837   CALCIUM 9.0 10/24/2012 1218   PROT 6.5 08/31/2018 0837   PROT 6.8 05/20/2016 1240   PROT 7.4 10/24/2012 1218   ALBUMIN 4.0 08/31/2018 0837   ALBUMIN 4.2 05/20/2016 1240   ALBUMIN 3.2 (L) 10/24/2012 1218   AST 17 08/31/2018 0837   AST 14 (L) 10/24/2012 1218   ALT 17 08/31/2018 0837   ALT 22 10/24/2012 1218   ALKPHOS 88 08/31/2018 0837   ALKPHOS 99 10/24/2012 1218   BILITOT 0.3 08/31/2018 0837   BILITOT <0.2 05/20/2016 1240   BILITOT 0.2 10/24/2012 1218   GFRNONAA >60 08/05/2017 0636   GFRNONAA >60 10/24/2012 1218   GFRAA >60 08/05/2017 0636   GFRAA >60 10/24/2012 1218   Lab Results  Component Value Date   CHOL 154 08/31/2018   HDL 67.30 08/31/2018   LDLCALC 52 08/31/2018   TRIG 173.0 (H) 08/31/2018   CHOLHDL 2 08/31/2018   Lab  Results  Component Value Date   HGBA1C 6.8 (H) 08/31/2018   No results found for: [id number] Lab Results  Component Value Date   TSH 3.14 08/31/2018       ASSESSMENT AND PLAN 58 y.o. year old female  has a past medical history of Allergy, Arthritis, Basal cell carcinoma, Basal cell carcinoma, Chicken pox, Complication of anesthesia, Depression, Diabetes mellitus without complication ([place]), Fatty liver,  GERD (gastroesophageal reflux disease), Gout, History of shingles, Hyperlipidemia, Hypertension, Insomnia, Migraine, Obesity, and Osteopenia. here with     ICD-10-CM   1. Chronic migraine without aura, with intractable migraine, so stated, with status migrainosus  G43.711   2. Benign essential hypertension  I10   3. Chronic sinusitis, unspecified location  J32.9   4. Insomnia, unspecified type  G47.00     Unfortunately, Starnisha has had an increase in frequency as well as intensity of headaches and migraines.  Increase coincides with several other comorbidities including elevated blood pressures, seasonal allergies with chronic sinusitis and insomnia.  She is currently working with primary care for management of blood pressures.  Norvasc was increased about 2 weeks ago.  She was recently switched from Claritin to [place] for seasonal allergies.  She has not tried Mucinex.  I have recommended Mucinex ER twice daily for 2 weeks with at least 40 ounces of water daily.  We have also discussed concerns of insomnia and sleep eating on Ambien.  She does not feel that sleep apnea is a concern at this time.  We have discussed considering a sleep study in the future.  She will continue close follow-up with primary care for management of blood pressure and sinus symptoms.  We will continue Emgality, Prozac and rizatriptan as prescribed for migraine management.  She will follow-up with me in 2 to 3 months.  If blood pressure and sinusitis are well managed at that time we will consider additional  preventative medications for migraines if needed.  She verbalizes understanding and agreement with this plan.  Healthy lifestyle with well-balanced diet and regular exercise advised.   No orders of the defined types were placed in this encounter.    No orders of the defined types were placed in this encounter.      Debbora Presto, FNP-C 11/08/2018, 11:15 AM [facility] [address] [place], [place] [address] [phone]  Made any corrections needed, and agree with history, physical, neuro exam,assessment and plan as stated.     Sarina Ill, MD [facility]

## 2018-11-08 ENCOUNTER — Encounter: Payer: Self-pay | Admitting: Family Medicine

## 2018-11-08 ENCOUNTER — Ambulatory Visit (INDEPENDENT_AMBULATORY_CARE_PROVIDER_SITE_OTHER): Payer: No Typology Code available for payment source | Admitting: Family Medicine

## 2018-11-08 ENCOUNTER — Other Ambulatory Visit: Payer: Self-pay

## 2018-11-08 VITALS — BP 144/75 | HR 70 | Temp 97.7°F | Ht 63.0 in | Wt 190.4 lb

## 2018-11-08 DIAGNOSIS — G43711 Chronic migraine without aura, intractable, with status migrainosus: Secondary | ICD-10-CM

## 2018-11-08 DIAGNOSIS — J329 Chronic sinusitis, unspecified: Secondary | ICD-10-CM | POA: Diagnosis not present

## 2018-11-08 DIAGNOSIS — G4700 Insomnia, unspecified: Secondary | ICD-10-CM | POA: Diagnosis not present

## 2018-11-08 DIAGNOSIS — I10 Essential (primary) hypertension: Secondary | ICD-10-CM

## 2018-11-08 NOTE — Patient Instructions (Addendum)
Continue Emgality, Prozac and Rizatriptan as prescribed  Please follow up with PCP regarding BP and sinusitis. I recommend adding Mucinex ER (plain Opal Sidles!, not multi symptom) twice daily. Drink at least 40 ounces of water for the next two weeks with Mucinex.   Consider sleep apnea test, discuss sleep concerns with Ambien with Dr Aundra Dubin  Follow up in 2-3 months  Sinusitis, Adult Sinusitis is soreness and swelling (inflammation) of your sinuses. Sinuses are hollow spaces in the bones around your face. They are located:  Around your eyes.  In the middle of your forehead.  Behind your nose.  In your cheekbones. Your sinuses and nasal passages are lined with a fluid called mucus. Mucus drains out of your sinuses. Swelling can trap mucus in your sinuses. This lets germs (bacteria, virus, or fungus) grow, which leads to infection. Most of the time, this condition is caused by a virus. What are the causes? This condition is caused by:  Allergies.  Asthma.  Germs.  Things that block your nose or sinuses.  Growths in the nose (nasal polyps).  Chemicals or irritants in the air.  Fungus (rare). What increases the risk? You are more likely to develop this condition if:  You have a weak body defense system (immune system).  You do a lot of swimming or diving.  You use nasal sprays too much.  You smoke. What are the signs or symptoms? The main symptoms of this condition are pain and a feeling of pressure around the sinuses. Other symptoms include:  Stuffy nose (congestion).  Runny nose (drainage).  Swelling and warmth in the sinuses.  Headache.  Toothache.  A cough that may get worse at night.  Mucus that collects in the throat or the back of the nose (postnasal drip).  Being unable to smell and taste.  Being very tired (fatigue).  A fever.  Sore throat.  Bad breath. How is this diagnosed? This condition is diagnosed based on:  Your symptoms.  Your  medical history.  A physical exam.  Tests to find out if your condition is short-term (acute) or long-term (chronic). Your doctor may: ? Check your nose for growths (polyps). ? Check your sinuses using a tool that has a light (endoscope). ? Check for allergies or germs. ? Do imaging tests, such as an MRI or CT scan. How is this treated? Treatment for this condition depends on the cause and whether it is short-term or long-term.  If caused by a virus, your symptoms should go away on their own within 10 days. You may be given medicines to relieve symptoms. They include: ? Medicines that shrink swollen tissue in the nose. ? Medicines that treat allergies (antihistamines). ? A spray that treats swelling of the nostrils. ? Rinses that help get rid of thick mucus in your nose (nasal saline washes).  If caused by bacteria, your doctor may wait to see if you will get better without treatment. You may be given antibiotic medicine if you have: ? A very bad infection. ? A weak body defense system.  If caused by growths in the nose, you may need to have surgery. Follow these instructions at home: Medicines  Take, use, or apply over-the-counter and prescription medicines only as told by your doctor. These may include nasal sprays.  If you were prescribed an antibiotic medicine, take it as told by your doctor. Do not stop taking the antibiotic even if you start to feel better. Hydrate and humidify   Drink enough  water to keep your pee (urine) pale yellow.  Use a cool mist humidifier to keep the humidity level in your home above 50%.  Breathe in steam for 10-15 minutes, 3-4 times a day, or as told by your doctor. You can do this in the bathroom while a hot shower is running.  Try not to spend time in cool or dry air. Rest  Rest as much as you can.  Sleep with your head raised (elevated).  Make sure you get enough sleep each night. General instructions   Put a warm, moist washcloth  on your face 3-4 times a day, or as often as told by your doctor. This will help with discomfort.  Wash your hands often with soap and water. If there is no soap and water, use hand sanitizer.  Do not smoke. Avoid being around people who are smoking (secondhand smoke).  Keep all follow-up visits as told by your doctor. This is important. Contact a doctor if:  You have a fever.  Your symptoms get worse.  Your symptoms do not get better within 10 days. Get help right away if:  You have a very bad headache.  You cannot stop throwing up (vomiting).  You have very bad pain or swelling around your face or eyes.  You have trouble seeing.  You feel confused.  Your neck is stiff.  You have trouble breathing. Summary  Sinusitis is swelling of your sinuses. Sinuses are hollow spaces in the bones around your face.  This condition is caused by tissues in your nose that become inflamed or swollen. This traps germs. These can lead to infection.  If you were prescribed an antibiotic medicine, take it as told by your doctor. Do not stop taking it even if you start to feel better.  Keep all follow-up visits as told by your doctor. This is important. This information is not intended to replace advice given to you by your health care provider. Make sure you discuss any questions you have with your health care provider. Document Released: 06/25/2007 Document Revised: 06/08/2017 Document Reviewed: 06/08/2017 Elsevier Patient Education  2020 [employer].   Hypertension, Adult High blood pressure (hypertension) is when the force of blood pumping through the arteries is too strong. The arteries are the blood vessels that carry blood from the heart throughout the body. Hypertension forces the heart to work harder to pump blood and may cause arteries to become narrow or stiff. Untreated or uncontrolled hypertension can cause a heart attack, heart failure, a stroke, kidney disease, and other  problems. A blood pressure reading consists of a higher number over a lower number. Ideally, your blood pressure should be below 120/80. The first ("top") number is called the systolic pressure. It is a measure of the pressure in your arteries as your heart beats. The second ("bottom") number is called the diastolic pressure. It is a measure of the pressure in your arteries as the heart relaxes. What are the causes? The exact cause of this condition is not known. There are some conditions that result in or are related to high blood pressure. What increases the risk? Some risk factors for high blood pressure are under your control. The following factors may make you more likely to develop this condition:  Smoking.  Having type 2 diabetes mellitus, high cholesterol, or both.  Not getting enough exercise or physical activity.  Being overweight.  Having too much fat, sugar, calories, or salt (sodium) in your diet.  Drinking too much  alcohol. Some risk factors for high blood pressure may be difficult or impossible to change. Some of these factors include:  Having chronic kidney disease.  Having a family history of high blood pressure.  Age. Risk increases with age.  Race. You may be at higher risk if you are African American.  Gender. Men are at higher risk than women before age 22. After age 65, women are at higher risk than men.  Having obstructive sleep apnea.  Stress. What are the signs or symptoms? High blood pressure may not cause symptoms. Very high blood pressure (hypertensive crisis) may cause:  Headache.  Anxiety.  Shortness of breath.  Nosebleed.  Nausea and vomiting.  Vision changes.  Severe chest pain.  Seizures. How is this diagnosed? This condition is diagnosed by measuring your blood pressure while you are seated, with your arm resting on a flat surface, your legs uncrossed, and your feet flat on the floor. The cuff of the blood pressure monitor will be  placed directly against the skin of your upper arm at the level of your heart. It should be measured at least twice using the same arm. Certain conditions can cause a difference in blood pressure between your right and left arms. Certain factors can cause blood pressure readings to be lower or higher than normal for a short period of time:  When your blood pressure is higher when you are in a health care provider's office than when you are at home, this is called white coat hypertension. Most people with this condition do not need medicines.  When your blood pressure is higher at home than when you are in a health care provider's office, this is called masked hypertension. Most people with this condition may need medicines to control blood pressure. If you have a high blood pressure reading during one visit or you have normal blood pressure with other risk factors, you may be asked to:  Return on a different day to have your blood pressure checked again.  Monitor your blood pressure at home for 1 week or longer. If you are diagnosed with hypertension, you may have other blood or imaging tests to help your health care provider understand your overall risk for other conditions. How is this treated? This condition is treated by making healthy lifestyle changes, such as eating healthy foods, exercising more, and reducing your alcohol intake. Your health care provider may prescribe medicine if lifestyle changes are not enough to get your blood pressure under control, and if:  Your systolic blood pressure is above 130.  Your diastolic blood pressure is above 80. Your personal target blood pressure may vary depending on your medical conditions, your age, and other factors. Follow these instructions at home: Eating and drinking   Eat a diet that is high in fiber and potassium, and low in sodium, added sugar, and fat. An example eating plan is called the DASH (Dietary Approaches to Stop Hypertension)  diet. To eat this way: ? Eat plenty of fresh fruits and vegetables. Try to fill one half of your plate at each meal with fruits and vegetables. ? Eat whole grains, such as whole-wheat pasta, brown rice, or whole-grain bread. Fill about one fourth of your plate with whole grains. ? Eat or drink low-fat dairy products, such as skim milk or low-fat yogurt. ? Avoid fatty cuts of meat, processed or cured meats, and poultry with skin. Fill about one fourth of your plate with lean proteins, such as fish, chicken without skin,  beans, eggs, or tofu. ? Avoid pre-made and processed foods. These tend to be higher in sodium, added sugar, and fat.  Reduce your daily sodium intake. Most people with hypertension should eat less than 1,500 mg of sodium a day.  Do not drink alcohol if: ? Your health care provider tells you not to drink. ? You are pregnant, may be pregnant, or are planning to become pregnant.  If you drink alcohol: ? Limit how much you use to:  0-1 drink a day for women.  0-2 drinks a day for men. ? Be aware of how much alcohol is in your drink. In the [place] one drink equals one 12 oz bottle of beer (355 mL), one 5 oz glass of wine (148 mL), or one 1 oz glass of hard liquor (44 mL). Lifestyle   Work with your health care provider to maintain a healthy body weight or to lose weight. Ask what an ideal weight is for you.  Get at least 30 minutes of exercise most days of the week. Activities may include walking, swimming, or biking.  Include exercise to strengthen your muscles (resistance exercise), such as Pilates or lifting weights, as part of your weekly exercise routine. Try to do these types of exercises for 30 minutes at least 3 days a week.  Do not use any products that contain nicotine or tobacco, such as cigarettes, e-cigarettes, and chewing tobacco. If you need help quitting, ask your health care provider.  Monitor your blood pressure at home as told by your health care  provider.  Keep all follow-up visits as told by your health care provider. This is important. Medicines  Take over-the-counter and prescription medicines only as told by your health care provider. Follow directions carefully. Blood pressure medicines must be taken as prescribed.  Do not skip doses of blood pressure medicine. Doing this puts you at risk for problems and can make the medicine less effective.  Ask your health care provider about side effects or reactions to medicines that you should watch for. Contact a health care provider if you:  Think you are having a reaction to a medicine you are taking.  Have headaches that keep coming back (recurring).  Feel dizzy.  Have swelling in your ankles.  Have trouble with your vision. Get help right away if you:  Develop a severe headache or confusion.  Have unusual weakness or numbness.  Feel faint.  Have severe pain in your chest or abdomen.  Vomit repeatedly.  Have trouble breathing. Summary  Hypertension is when the force of blood pumping through your arteries is too strong. If this condition is not controlled, it may put you at risk for serious complications.  Your personal target blood pressure may vary depending on your medical conditions, your age, and other factors. For most people, a normal blood pressure is less than 120/80.  Hypertension is treated with lifestyle changes, medicines, or a combination of both. Lifestyle changes include losing weight, eating a healthy, low-sodium diet, exercising more, and limiting alcohol. This information is not intended to replace advice given to you by your health care provider. Make sure you discuss any questions you have with your health care provider. Document Released: 01/06/2005 Document Revised: 09/16/2017 Document Reviewed: 09/16/2017 Elsevier Patient Education  2020 [employer].   Migraine Headache A migraine headache is a very strong throbbing pain on one side or  both sides of your head. This type of headache can also cause other symptoms. It can last from  4 hours to 3 days. Talk with your doctor about what things may bring on (trigger) this condition. What are the causes? The exact cause of this condition is not known. This condition may be triggered or caused by:  Drinking alcohol.  Smoking.  Taking medicines, such as: ? Medicine used to treat chest pain (nitroglycerin). ? Birth control pills. ? Estrogen. ? Some blood pressure medicines.  Eating or drinking certain products.  Doing physical activity. Other things that may trigger a migraine headache include:  Having a menstrual period.  Pregnancy.  Hunger.  Stress.  Not getting enough sleep or getting too much sleep.  Weather changes.  Tiredness (fatigue). What increases the risk?  Being 33-61 years old.  Being female.  Having a family history of migraine headaches.  Being Caucasian.  Having depression or anxiety.  Being very overweight. What are the signs or symptoms?  A throbbing pain. This pain may: ? Happen in any area of the head, such as on one side or both sides. ? Make it hard to do daily activities. ? Get worse with physical activity. ? Get worse around bright lights or loud noises.  Other symptoms may include: ? Feeling sick to your stomach (nauseous). ? Vomiting. ? Dizziness. ? Being sensitive to bright lights, loud noises, or smells.  Before you get a migraine headache, you may get warning signs (an aura). An aura may include: ? Seeing flashing lights or having blind spots. ? Seeing bright spots, halos, or zigzag lines. ? Having tunnel vision or blurred vision. ? Having numbness or a tingling feeling. ? Having trouble talking. ? Having weak muscles.  Some people have symptoms after a migraine headache (postdromal phase), such as: ? Tiredness. ? Trouble thinking (concentrating). How is this treated?  Taking medicines that: ? Relieve  pain. ? Relieve the feeling of being sick to your stomach. ? Prevent migraine headaches.  Treatment may also include: ? Having acupuncture. ? Avoiding foods that bring on migraine headaches. ? Learning ways to control your body functions (biofeedback). ? Therapy to help you know and deal with negative thoughts (cognitive behavioral therapy). Follow these instructions at home: Medicines  Take over-the-counter and prescription medicines only as told by your doctor.  Ask your doctor if the medicine prescribed to you: ? Requires you to avoid driving or using heavy machinery. ? Can cause trouble pooping (constipation). You may need to take these steps to prevent or treat trouble pooping:  Drink enough fluid to keep your pee (urine) pale yellow.  Take over-the-counter or prescription medicines.  Eat foods that are high in fiber. These include beans, whole grains, and fresh fruits and vegetables.  Limit foods that are high in fat and sugar. These include fried or sweet foods. Lifestyle  Do not drink alcohol.  Do not use any products that contain nicotine or tobacco, such as cigarettes, e-cigarettes, and chewing tobacco. If you need help quitting, ask your doctor.  Get at least 8 hours of sleep every night.  Limit and deal with stress. General instructions      Keep a journal to find out what may bring on your migraine headaches. For example, write down: ? What you eat and drink. ? How much sleep you get. ? Any change in what you eat or drink. ? Any change in your medicines.  If you have a migraine headache: ? Avoid things that make your symptoms worse, such as bright lights. ? It may help to lie down in a dark,  quiet room. ? Do not drive or use heavy machinery. ? Ask your doctor what activities are safe for you.  Keep all follow-up visits as told by your doctor. This is important. Contact a doctor if:  You get a migraine headache that is different or worse than others  you have had.  You have more than 15 headache days in one month. Get help right away if:  Your migraine headache gets very bad.  Your migraine headache lasts longer than 72 hours.  You have a fever.  You have a stiff neck.  You have trouble seeing.  Your muscles feel weak or like you cannot control them.  You start to lose your balance a lot.  You start to have trouble walking.  You pass out (faint).  You have a seizure. Summary  A migraine headache is a very strong throbbing pain on one side or both sides of your head. These headaches can also cause other symptoms.  This condition may be treated with medicines and changes to your lifestyle.  Keep a journal to find out what may bring on your migraine headaches.  Contact a doctor if you get a migraine headache that is different or worse than others you have had.  Contact your doctor if you have more than 15 headache days in a month. This information is not intended to replace advice given to you by your health care provider. Make sure you discuss any questions you have with your health care provider. Document Released: 10/16/2007 Document Revised: 04/30/2018 Document Reviewed: 02/18/2018 Elsevier Patient Education  2020 [employer].

## 2018-11-09 ENCOUNTER — Encounter: Payer: Self-pay | Admitting: Internal Medicine

## 2018-11-10 ENCOUNTER — Other Ambulatory Visit: Payer: Self-pay | Admitting: Internal Medicine

## 2018-11-10 DIAGNOSIS — I10 Essential (primary) hypertension: Secondary | ICD-10-CM

## 2018-11-10 MED ORDER — HYDROCHLOROTHIAZIDE 12.5 MG PO TABS: 12.5000 mg | ORAL_TABLET | Freq: Every day | ORAL | 11 refills | Status: DC

## 2018-11-17 ENCOUNTER — Ambulatory Visit: Payer: No Typology Code available for payment source | Admitting: Internal Medicine

## 2018-11-24 ENCOUNTER — Other Ambulatory Visit: Payer: Self-pay

## 2018-11-24 ENCOUNTER — Ambulatory Visit (INDEPENDENT_AMBULATORY_CARE_PROVIDER_SITE_OTHER): Payer: No Typology Code available for payment source | Admitting: Urology

## 2018-11-24 ENCOUNTER — Encounter: Payer: Self-pay | Admitting: Urology

## 2018-11-24 VITALS — BP 146/86 | HR 92 | Ht 63.0 in | Wt 191.0 lb

## 2018-11-24 DIAGNOSIS — R319 Hematuria, unspecified: Secondary | ICD-10-CM

## 2018-11-24 LAB — URINALYSIS, COMPLETE
Bilirubin, UA: NEGATIVE
Glucose, UA: NEGATIVE
Ketones, UA: NEGATIVE
Nitrite, UA: NEGATIVE
Protein,UA: NEGATIVE
Specific Gravity, UA: 1.02 (ref 1.005–1.030)
Urobilinogen, Ur: 0.2 mg/dL (ref 0.2–1.0)
pH, UA: 6.5 (ref 5.0–7.5)

## 2018-11-24 LAB — MICROSCOPIC EXAMINATION

## 2018-11-24 NOTE — Progress Notes (Signed)
11/24/2018 9:19 AM   Kristin [name] 08-20-1960 [id number]  Referring provider: McLean-Scocuzza, Nino Glow, MD [place],  [place] [address]  Chief Complaint  Patient presents with   Hematuria    New patient    HPI: 58 year old female referred for further possible evaluation of gross hematuria.  She reported to her PCP that her urine has been darker than usual.  She denies any gross hematuria.  She is never seen clots.  Her urine was checked on several occasions (08/31/2018 and 10/21/2018) at her primary care's office and no blood was noted both microscopically are grossly.  No flank pain, history of kidney stones, or recent UTIs.  She is a never smoker.  No family history of GU malignancy.  Her mother has metastatic breast cancer.  Normal baseline urination without urgency frequency or incontinence.   PMH: Past Medical History:  Diagnosis Date   Allergy    Arthritis    Basal cell carcinoma    face, back, neck, arm   Basal cell carcinoma    nose, left cheek   Chicken pox    Complication of anesthesia    blood pressure elevated after gastric sleeve surgery in recovery, pt does not want mask over face   Depression    Diabetes mellitus without complication ([place])    pre-diabetic currently, was diabetic prior to gastric sleeve surgery    Fatty liver    GERD (gastroesophageal reflux disease)    Gout    History of shingles    Hyperlipidemia    Hypertension    no problems since losing weight after gastric sleeve surgery   Insomnia    Migraine    Obesity    Osteopenia     Surgical History: Past Surgical History:  Procedure Laterality Date   ABDOMINAL HYSTERECTOMY     no h/o abnormal pap still has 1 ovary left   ANKLE ARTHROSCOPY Left 05/29/2016   Procedure: ANKLE ARTHROSCOPY;  Surgeon: [name], DPM;  Location: [place];  Service: Podiatry;  Laterality: Left;   APPENDECTOMY     basal cell removed     [place]     cyst removed from wrist     FOOT SURGERY     left foot 11/2016 Dr. Laymond Purser    left elbow surgery     07/2017 Dr Leanor Kail epicondylitis    MOHS SURGERY     x 2 face nose and left cheek    OSTEOCHONDROMA EXCISION Left 05/29/2016   Procedure: OSTEOCHONDRAL DRILLING TALUS;  Surgeon: [name], DPM;  Location: [place];  Service: Podiatry;  Laterality: Left;   PLANTAR FASCIA RELEASE Left 05/29/2016   Procedure: ENDOSCOPIC PLANTAR FASCIOTOMY;  Surgeon: [name], DPM;  Location: [place];  Service: Podiatry;  Laterality: Left;   ROTATOR CUFF REPAIR     lt shoulder   STOMACH SURGERY  2017   gastric sleeve surgery   TENNIS ELBOW RELEASE/NIRSCHEL PROCEDURE Left 08/05/2017   Procedure: TENNIS ELBOW RELEASE/NIRSCHEL PROCEDURE;  Surgeon: Leanor Kail, MD;  Location: [facility];  Service: Orthopedics;  Laterality: Left;   TUBAL LIGATION     wedge resection of ovary  1982    Home Medications:  Allergies as of 11/24/2018      Reactions   Tetracyclines & Related Swelling, Rash   FACE SWELL, RASH ON CHEST   Alcohol Rash   ABDOMINAL CRAMPS, drinking alcohol   Azithromycin  Rash   Metoprolol Diarrhea      Medication List       Accurate as of November 24, 2018  9:19 AM. If you have any questions, ask your nurse or doctor.        STOP taking these medications   neomycin-polymyxin-hydrocortisone OTIC solution Commonly known as: CORTISPORIN Stopped by: Hollice Espy, MD   oxyCODONE-acetaminophen 5-325 MG tablet Commonly known as: Percocet Stopped by: Hollice Espy, MD     TAKE these medications   amLODipine 5 MG tablet Commonly known as: NORVASC Take 1 tablet (5 mg total) by mouth daily. And stop 2.5 mg daily (2 pills)   celecoxib 100 MG capsule Commonly known as: CELEBREX Take by mouth.   cyclobenzaprine 10 MG tablet Commonly known as: FLEXERIL Take 1 tablet (10 mg total) by mouth 2 (two) times daily as needed  for muscle spasms.   estradiol 1 MG tablet Commonly known as: ESTRACE Take 1 tablet (1 mg total) by mouth daily.   fexofenadine 180 MG tablet Commonly known as: ALLEGRA Take 1 tablet (180 mg total) by mouth daily as needed for allergies or rhinitis.   FLUoxetine 40 MG capsule Commonly known as: PROZAC Take 1 capsule (40 mg total) by mouth daily.   Galcanezumab-gnlm 120 MG/ML Soaj Commonly known as: Emgality Inject 120 mg into the skin every 30 (thirty) days.   hydrochlorothiazide 12.5 MG tablet Commonly known as: HYDRODIURIL Take 1 tablet (12.5 mg total) by mouth daily. In am   omeprazole 20 MG capsule Commonly known as: PRILOSEC Take 20 mg by mouth daily as needed (indigestion).   ondansetron 4 MG disintegrating tablet Commonly known as: ZOFRAN-ODT Take 1 tablet (4 mg total) by mouth every 8 (eight) hours as needed for nausea.   rizatriptan 10 MG disintegrating tablet Commonly known as: Maxalt-MLT Take 1 tablet (10 mg total) by mouth as needed for migraine. May repeat in 2 hours if needed   rosuvastatin 20 MG tablet Commonly known as: Crestor Take 1 tablet (20 mg total) by mouth at bedtime.   Turmeric 500 MG Caps   Vitamin D 125 MCG (5000 UT) Caps   zolpidem 12.5 MG CR tablet Commonly known as: AMBIEN CR Take 1 tablet (12.5 mg total) by mouth at bedtime as needed.       Allergies:  Allergies  Allergen Reactions   Tetracyclines & Related Swelling and Rash    FACE SWELL, RASH ON CHEST   Alcohol Rash    ABDOMINAL CRAMPS, drinking alcohol   Azithromycin Rash   Metoprolol Diarrhea    Family History: Family History  Problem Relation Age of Onset   Heart disease Mother    Breast cancer Mother 58       metastatic, now hip, spine, and leg    Arthritis Mother    Hyperlipidemia Mother    Cancer Mother        spinal surgery. breast dx'ed age 67 now metastatic 37 as of 07/13/18   Cystic fibrosis Mother        carrier    Heart disease Father     Lung cancer Father    Hyperlipidemia Father    Hypertension Father    Diabetes Sister    Diabetes Maternal Aunt    Diabetes Maternal Uncle    Diabetes Maternal Grandfather    Hyperlipidemia Maternal Grandfather    Arthritis Maternal Grandmother    Sudden death Cousin    Diabetes Maternal Aunt    Diabetes Sister    Cystic  fibrosis Other        great niece    Colon cancer Neg Hx    Ovarian cancer Neg Hx     Social History:  reports that she has never smoked. She has never used smokeless tobacco. She reports that she does not drink alcohol or use drugs.  ROS: UROLOGY Frequent Urination?: No Hard to postpone urination?: No Burning/pain with urination?: No Get up at night to urinate?: No Leakage of urine?: No Urine stream starts and stops?: No Trouble starting stream?: No Do you have to strain to urinate?: No Blood in urine?: Yes Urinary tract infection?: No Sexually transmitted disease?: No Injury to kidneys or bladder?: No Painful intercourse?: No Weak stream?: No Currently pregnant?: No Vaginal bleeding?: No Last menstrual period?: n  Gastrointestinal Nausea?: No Vomiting?: No Indigestion/heartburn?: No Diarrhea?: No Constipation?: No  Constitutional Fever: No Night sweats?: No Weight loss?: No Fatigue?: No  Skin Skin rash/lesions?: No Itching?: No  Eyes Blurred vision?: No Double vision?: No  Ears/Nose/Throat Sore throat?: No Sinus problems?: No  Hematologic/Lymphatic Swollen glands?: No Easy bruising?: No  Cardiovascular Leg swelling?: No Chest pain?: No  Respiratory Cough?: No Shortness of breath?: No  Endocrine Excessive thirst?: No  Musculoskeletal Back pain?: No Joint pain?: No  Neurological Headaches?: No Dizziness?: No  Psychologic Depression?: No Anxiety?: No  Physical Exam: BP (!) 146/86    Pulse 92    Ht 5\' 3"  (1.6 m)    Wt 191 lb (86.6 kg)    BMI 33.83 kg/m   Constitutional:  Alert and oriented, No  acute distress. HEENT: [place] AT, moist mucus membranes.  Trachea midline, no masses. Cardiovascular: No clubbing, cyanosis, or edema. Respiratory: Normal respiratory effort, no increased work of breathing. Skin: No rashes, bruises or suspicious lesions. Neurologic: Grossly intact, no focal deficits, moving all 4 extremities. Psychiatric: Normal mood and affect.  Laboratory Data: Lab Results  Component Value Date   WBC 8.4 08/31/2018   HGB 13.6 08/31/2018   HCT 40.8 08/31/2018   MCV 89.2 08/31/2018   PLT 289.0 08/31/2018    Lab Results  Component Value Date   CREATININE 0.59 08/31/2018    Lab Results  Component Value Date   HGBA1C 6.8 (H) 08/31/2018    Urinalysis Multiple previous urinalysis reviewed.  UA today with 1+ blood on dip, however over no evidence of microscopic blood on microscopic evaluation.  Pertinent Imaging: CT abdomen pelvis with contrast on 01/2017 without GU pathology  Assessment & Plan:    1. Hematuria, unspecified type We discussed today that she has had several urine dipsticks with trace or small amount of blood, however discussed that this is a chemical reaction with hemoglobin and can have false positives.  She has now had 3 individual urinalysis over the past several months all of which indicate no evidence of true microscopic blood in the urine.  She has no personal history of gross hematuria or risk factors for bladder cancer.  As such, I do not feel that she needs any further evaluation at this point in time.  CT scan from 2019 also reassuring.  I recommended that she have an annual urinalysis with her primary care and return as needed if she has evidence of true microscopic blood in her urine.  Questions answered today. - Urinalysis, Complete   Hollice Espy, MD  [facility] [address] [place], [place] [address] [phone]

## 2018-12-06 ENCOUNTER — Telehealth: Payer: Self-pay | Admitting: Family Medicine

## 2018-12-06 NOTE — Telephone Encounter (Signed)
Pt called and states that she has had a continuous headache for over a week and the rizatriptan (MAXALT-MLT) 10 MG disintegrating tablet is not working for her. Please advise.

## 2018-12-07 ENCOUNTER — Other Ambulatory Visit: Payer: Self-pay | Admitting: Family Medicine

## 2018-12-07 MED ORDER — NURTEC 75 MG PO TBDP: 75.0000 mg | ORAL_TABLET | Freq: Every day | ORAL | 11 refills | Status: DC | PRN

## 2018-12-07 NOTE — Telephone Encounter (Signed)
Please let her know that I am so sorry. Headaches can be tricky sometimes. I know she has been working with PCP for elevated BP. I see that it was mildly elevated at her appt with urology yesterday as well. Make sure she is following closely with PCP for this as elevated BP can cause a headache that will not respond to medications. Also, she has allergies and noted chronic sinusitis on her MRI last year. Make sure she is taking daily antihistamine and tries Mucinex if she hasn't already. I have called in Nurtec. This is a new medication similar to [employer] but in a quicker acting form. She can take one daily but have her avoid regular use. Sometimes we will combine Nurtec, benadryl, and ondansetron if headache is not responsive to just Nurtec. She can try this combo at night as benadryl can make you sleepy. Make sure she is drinking plenty of water as well. Have her call if headache does not respond.

## 2018-12-07 NOTE — Telephone Encounter (Signed)
Patient called back to FU in regards to her call from yesterday. Please follow up.

## 2018-12-07 NOTE — Telephone Encounter (Signed)
Spoke with the patient and she verbalized understanding the instructions given by Amy in regards to the Nurtec. She was very appreciative for the call. No other questions or concerns at this time.

## 2018-12-07 NOTE — Telephone Encounter (Signed)
Spoke with the patient and she stated that she has been having the same headache for 10 days now. She mentioned that when she takes her Maxalt that it just takes the edge off the headache but the headache is still there. Patient had taken a dose of Maxalt while we were on the phone. Please advise.

## 2018-12-09 ENCOUNTER — Telehealth: Payer: Self-pay | Admitting: Internal Medicine

## 2018-12-09 NOTE — Telephone Encounter (Signed)
Pt states that she and PCP are keeping track of her blood pressure. Last night reading was 192/81, and this morning reading was 141/77. Pt would like to know what PCP advises.

## 2018-12-10 ENCOUNTER — Other Ambulatory Visit: Payer: Self-pay | Admitting: Internal Medicine

## 2018-12-10 ENCOUNTER — Encounter: Payer: Self-pay | Admitting: Internal Medicine

## 2018-12-10 DIAGNOSIS — I10 Essential (primary) hypertension: Secondary | ICD-10-CM

## 2018-12-10 DIAGNOSIS — J329 Chronic sinusitis, unspecified: Secondary | ICD-10-CM

## 2018-12-10 MED ORDER — LEVOFLOXACIN 750 MG PO TABS: 750.0000 mg | ORAL_TABLET | Freq: Every day | ORAL | 0 refills | Status: DC

## 2018-12-10 MED ORDER — HYDROCHLOROTHIAZIDE 25 MG PO TABS: 25.0000 mg | ORAL_TABLET | Freq: Every day | ORAL | 3 refills | Status: DC

## 2018-12-10 NOTE — Telephone Encounter (Signed)
Does she want to see ENT about chronic sinus issues ?  Is she allergic to augmentin or PCN we cant keep doing antibiotics for sinus issues   If bp not controlled with increase hctz 25 we need to add losartan 25 mg daily to other meds call back next week   MTS

## 2018-12-10 NOTE — Telephone Encounter (Signed)
Pt called back in to be advised. Pt says that she was expecting a response back from provider by now.   Please advise pt directly.

## 2018-12-10 NOTE — Telephone Encounter (Signed)
Patient is ok with the increase in hctz to 25 mg and she states she thinks she has a sinus infection and wants an antibiotic.  Kristin [name],cma

## 2018-12-10 NOTE — Telephone Encounter (Signed)
We can increase hctz to 25 mg or add losartan 25 mg daily  What would she like to do  Sorry for the delay covering multiple providers due to being out   [place]

## 2018-12-10 NOTE — Telephone Encounter (Signed)
Patient states yes she will see ENT for her sinus issues and she states she is not allergic to PCN but she is allergic to  Augmentin.  And she will  Call next week about the bp medication.  Sulaiman Imbert,cma

## 2018-12-12 ENCOUNTER — Other Ambulatory Visit: Payer: Self-pay | Admitting: Neurology

## 2018-12-12 NOTE — Telephone Encounter (Signed)
Sent levaquin and ent referral placed

## 2018-12-18 ENCOUNTER — Other Ambulatory Visit: Payer: Self-pay | Admitting: Neurology

## 2018-12-18 DIAGNOSIS — G43711 Chronic migraine without aura, intractable, with status migrainosus: Secondary | ICD-10-CM

## 2018-12-22 ENCOUNTER — Other Ambulatory Visit: Payer: Self-pay

## 2018-12-22 DIAGNOSIS — Z20822 Contact with and (suspected) exposure to covid-19: Secondary | ICD-10-CM

## 2018-12-24 LAB — NOVEL CORONAVIRUS, NAA: SARS-CoV-2, NAA: NOT DETECTED

## 2018-12-24 NOTE — Telephone Encounter (Signed)
Patient has a scheduled ENT with Dr. Sammuel Hines. [address], [address], [place], [place] [address] phone #  [phone]. specialist requesting office notes. Patient would like a follow up call when notes have been sent

## 2018-12-24 NOTE — Telephone Encounter (Signed)
Left message for patient to inform notes have been sent electronically.

## 2018-12-27 ENCOUNTER — Encounter: Payer: Self-pay | Admitting: Family Medicine

## 2018-12-30 MED ORDER — TOPIRAMATE 50 MG PO TABS: 50.0000 mg | ORAL_TABLET | Freq: Every day | ORAL | 11 refills | Status: DC

## 2019-01-11 ENCOUNTER — Ambulatory Visit (INDEPENDENT_AMBULATORY_CARE_PROVIDER_SITE_OTHER): Payer: No Typology Code available for payment source | Admitting: Internal Medicine

## 2019-01-11 ENCOUNTER — Other Ambulatory Visit: Payer: Self-pay

## 2019-01-11 VITALS — BP 133/74 | HR 74 | Ht 63.0 in | Wt 190.0 lb

## 2019-01-11 DIAGNOSIS — R5383 Other fatigue: Secondary | ICD-10-CM

## 2019-01-11 DIAGNOSIS — R410 Disorientation, unspecified: Secondary | ICD-10-CM

## 2019-01-11 DIAGNOSIS — G43711 Chronic migraine without aura, intractable, with status migrainosus: Secondary | ICD-10-CM

## 2019-01-11 DIAGNOSIS — R197 Diarrhea, unspecified: Secondary | ICD-10-CM

## 2019-01-11 DIAGNOSIS — E1165 Type 2 diabetes mellitus with hyperglycemia: Secondary | ICD-10-CM

## 2019-01-11 DIAGNOSIS — R1032 Left lower quadrant pain: Secondary | ICD-10-CM

## 2019-01-11 DIAGNOSIS — I10 Essential (primary) hypertension: Secondary | ICD-10-CM

## 2019-01-11 NOTE — Progress Notes (Signed)
Virtual Visit via Video Note  I connected with Kristin [name]  on 01/11/19 at  1:00 PM EST by a video enabled telemedicine application and verified that I am speaking with the correct person using two identifiers.  Location patient: home Location provider:work or home office Persons participating in the virtual visit: patient, provider  I discussed the limitations of evaluation and management by telemedicine and the availability of in person appointments. The patient expressed understanding and agreed to proceed.   HPI: 1. C/o diarrhea and LLQ ab pain no blood in stool pain with food 5/10 today 2/10 x weeks had diarrhea. She has been on antibiotics in the past nothing tried. Sh eis having 3-4 diarrhea stools a day and liquid and feels weak/fatigue and tried and she is having gas. She stopped taking celebrex thinking this was a cause but still had diarrhea 30 minutes to 1 hour after eating food. She reports her pulse goes up to 100 with exertion  2. Migraines topamax is helping but c/o brain fog/confusion in mid sentence cant remember what she is thinking and family notices will CC amy lomax neurology  3. HTN BP 133/74 12 pm in norvasc 5 and hctz 25 mg qd  4. Left ear pain ENT cleared her and stated no infection   ROS: See pertinent positives and negatives per HPI.  Past Medical History:  Diagnosis Date  . Allergy   . Arthritis   . Basal cell carcinoma    face, back, neck, arm  . Basal cell carcinoma    nose, left cheek  . Chicken pox   . Complication of anesthesia    blood pressure elevated after gastric sleeve surgery in recovery, pt does not want mask over face  . Depression   . Diabetes mellitus without complication ([place])    pre-diabetic currently, was diabetic prior to gastric sleeve surgery   . Fatty liver   . GERD (gastroesophageal reflux disease)   . Gout   . History of shingles   . Hyperlipidemia   . Hypertension    no problems since losing weight after gastric sleeve  surgery  . Insomnia   . Migraine   . Obesity   . Osteopenia     Past Surgical History:  Procedure Laterality Date  . ABDOMINAL HYSTERECTOMY     no h/o abnormal pap still has 1 ovary left  . ANKLE ARTHROSCOPY Left 05/29/2016   Procedure: ANKLE ARTHROSCOPY;  Surgeon: [name], DPM;  Location: [place];  Service: Podiatry;  Laterality: Left;  . APPENDECTOMY    . basal cell removed    . [place]  . CHOLECYSTECTOMY    . cyst removed from wrist    . FOOT SURGERY     left foot 11/2016 Dr. Laymond Purser   . left elbow surgery     07/2017 Dr Leanor Kail epicondylitis   . MOHS SURGERY     x 2 face nose and left cheek   . OSTEOCHONDROMA EXCISION Left 05/29/2016   Procedure: OSTEOCHONDRAL DRILLING TALUS;  Surgeon: [name], DPM;  Location: [facility];  Service: Podiatry;  Laterality: Left;  . PLANTAR FASCIA RELEASE Left 05/29/2016   Procedure: ENDOSCOPIC PLANTAR FASCIOTOMY;  Surgeon: [name], DPM;  Location: [place];  Service: Podiatry;  Laterality: Left;  . ROTATOR CUFF REPAIR     lt shoulder  . STOMACH SURGERY  2017   gastric sleeve surgery  . TENNIS ELBOW RELEASE/NIRSCHEL PROCEDURE  Left 08/05/2017   Procedure: TENNIS ELBOW RELEASE/NIRSCHEL PROCEDURE;  Surgeon: Leanor Kail, MD;  Location: [facility];  Service: Orthopedics;  Laterality: Left;  . TUBAL LIGATION    . wedge resection of ovary  1982    Family History  Problem Relation Age of Onset  . Heart disease Mother   . Breast cancer Mother 66       metastatic, now hip, spine, and leg   . Arthritis Mother   . Hyperlipidemia Mother   . Cancer Mother        spinal surgery. breast dx'ed age 23 now metastatic 71 as of 07/13/18  . Cystic fibrosis Mother        carrier   . Heart disease Father   . Lung cancer Father   . Hyperlipidemia Father   . Hypertension Father   . Diabetes Sister   . Diabetes Maternal Aunt   . Diabetes Maternal Uncle   . Diabetes Maternal Grandfather   . Hyperlipidemia  Maternal Grandfather   . Arthritis Maternal Grandmother   . Sudden death Cousin   . Diabetes Maternal Aunt   . Diabetes Sister   . Cystic fibrosis Other        great niece   . Colon cancer Neg Hx   . Ovarian cancer Neg Hx     SOCIAL HX: married lives with kids   Current Outpatient Medications:  .  amLODipine (NORVASC) 5 MG tablet, Take 1 tablet (5 mg total) by mouth daily. And stop 2.5 mg daily (2 pills), Disp: 90 tablet, Rfl: 3 .  celecoxib (CELEBREX) 100 MG capsule, Take by mouth., Disp: , Rfl:  .  Cholecalciferol (VITAMIN D) 125 MCG (5000 UT) CAPS, , Disp: , Rfl:  .  estradiol (ESTRACE) 1 MG tablet, Take 1 tablet (1 mg total) by mouth daily., Disp: 90 tablet, Rfl: 3 .  fexofenadine (ALLEGRA) 180 MG tablet, Take 1 tablet (180 mg total) by mouth daily as needed for allergies or rhinitis., Disp: 90 tablet, Rfl: 3 .  FLUoxetine (PROZAC) 40 MG capsule, Take 1 capsule (40 mg total) by mouth daily., Disp: 90 capsule, Rfl: 3 .  Galcanezumab-gnlm (EMGALITY) 120 MG/ML SOAJ, Inject 120 mg into the skin every 30 (thirty) days. Appointment needed for further refills., Disp: 1 pen, Rfl: 1 .  hydrochlorothiazide (HYDRODIURIL) 25 MG tablet, Take 1 tablet (25 mg total) by mouth daily. In am, Disp: 90 tablet, Rfl: 3 .  omeprazole (PRILOSEC) 20 MG capsule, Take 20 mg by mouth daily as needed (indigestion)., Disp: , Rfl:  .  ondansetron (ZOFRAN-ODT) 4 MG disintegrating tablet, TAKE 1 TABLET BY MOUTH EVERY 8 HOURS AS NEEDED FOR NAUSEA, Disp: 12 tablet, Rfl: 4 .  Rimegepant Sulfate (NURTEC) 75 MG TBDP, Take 75 mg by mouth daily as needed (take for abortive therapy of migraine, no more than 1 tablet in 24 hours or 10 per month)., Disp: 10 tablet, Rfl: 11 .  rizatriptan (MAXALT-MLT) 10 MG disintegrating tablet, Take 1 tablet (10 mg total) by mouth as needed for migraine. May repeat in 2 hours if needed, Disp: 10 tablet, Rfl: 11 .  rosuvastatin (CRESTOR) 20 MG tablet, Take 1 tablet (20 mg total) by mouth at  bedtime., Disp: 90 tablet, Rfl: 3 .  topiramate (TOPAMAX) 50 MG tablet, Take 1 tablet (50 mg total) by mouth daily., Disp: 30 tablet, Rfl: 11 .  Turmeric 500 MG CAPS, , Disp: , Rfl:  .  zolpidem (AMBIEN CR) 12.5 MG CR tablet, Take 1 tablet (12.5  mg total) by mouth at bedtime as needed., Disp: 30 tablet, Rfl: 5  EXAM:  VITALS per patient if applicable:  GENERAL: alert, oriented, appears well and in no acute distress  HEENT: atraumatic, conjunttiva clear, no obvious abnormalities on inspection of external nose and ears  NECK: normal movements of the head and neck  LUNGS: on inspection no signs of respiratory distress, breathing rate appears normal, no obvious gross SOB, gasping or wheezing  CV: no obvious cyanosis  MS: moves all visible extremities without noticeable abnormality  PSYCH/NEURO: pleasant and cooperative, no obvious depression or anxiety, speech and thought processing grossly intact  ASSESSMENT AND PLAN:  Discussed the following assessment and plan:  Fatigue, unspecified type - Plan: Comprehensive metabolic panel, CBC with Differential/Platelet, Magnesium TSH nl 08/31/18 3.14  Could be dehydrated with diarrhea   Diarrhea, unspecified type - Plan: Comprehensive metabolic panel, CBC with Differential/Platelet, Magnesium, Stool Culture neg, Clostridium Difficile by PCR([facility]) neg rec brat diet and peptobismol   Left lower quadrant abdominal pain - Plan: Comprehensive metabolic panel, CBC with Differential/Platelet, Magnesium, Urinalysis, Routine w reflex microscopic, Urine Culture  Type 2 diabetes mellitus with hyperglycemia, without long-term current use of insulin (HCC) - Plan: Magnesium, HgB A1c 6.6 come down from 6.8  Not currently on meds  Healthy diet and exercise   Essential hypertension Cont meds   Chronic migraine without aura, with intractable migraine, so stated, with status migrainosus CC Amy Lomax with response and c/w brain fog with topamax  address with pt and next visit   Confusion  See above   HM Flu shot utd utdTdap  Disc shingrixprev hep A vaccine1/2 2nd dose due 2nd dose, hep B immunewith h/o fatty liver Hep C neg MMRimmune A1C check 6.4   Never smoker, chew congratulatednot doing either mammoneg7/02/2018 normalordered Dr. Marcelline Mates  Colonoscopy -obtained records 07/15/11 colonoscopy Dr. Candace Cruise nl repeat in 10 years  Pap s/p hysterectomy ? Cervix intact has appt Dr. Enzo Bi 02/10/17.-Appt went well1/2019 ob/gynf/u in 1 year  -per notepap not neededshe will f/u with Dr. Marcelline Mates now  Dermatology [facility] q6 months h/o BCC nose and left cheek s/p Mohs and h/o Aks  Of not she saw [facility] eye late March 2019 and had eye glasses checked  rheumatology Dr. Amil Amen pain in hands and feet celebrex started as of 01/11/19 pt stopped  NS-Dr. Cari Caraway Allergist in [place] and [place]   -we discussed possible serious and likely etiologies, options for evaluation and workup, limitations of telemedicine visit vs in person visit, treatment, treatment risks and precautions. Pt prefers to treat via telemedicine empirically rather then risking or undertaking an in person visit at this moment. Patient agrees to seek prompt in person care if worsening, new symptoms arise, or if is not improving with treatment.   I discussed the assessment and treatment plan with the patient. The patient was provided an opportunity to ask questions and all were answered. The patient agreed with the plan and demonstrated an understanding of the instructions.   The patient was advised to call back or seek an in-person evaluation if the symptoms worsen or if the condition fails to improve as anticipated.  Time spent 20 minutes  Delorise Jackson, MD

## 2019-01-13 ENCOUNTER — Encounter: Payer: Self-pay | Admitting: Internal Medicine

## 2019-01-13 LAB — CLOSTRIDIUM DIFFICILE BY PCR: Toxigenic C. Difficile by PCR: NEGATIVE

## 2019-01-13 LAB — CBC WITH DIFFERENTIAL/PLATELET
Basophils Absolute: 0.1 10*3/uL (ref 0.0–0.2)
Basos: 1 %
EOS (ABSOLUTE): 0.2 10*3/uL (ref 0.0–0.4)
Eos: 2 %
Hematocrit: 42 % (ref 34.0–46.6)
Hemoglobin: 14.2 g/dL (ref 11.1–15.9)
Immature Grans (Abs): 0 10*3/uL (ref 0.0–0.1)
Immature Granulocytes: 0 %
Lymphocytes Absolute: 2.5 10*3/uL (ref 0.7–3.1)
Lymphs: 25 %
MCH: 29.2 pg (ref 26.6–33.0)
MCHC: 33.8 g/dL (ref 31.5–35.7)
MCV: 86 fL (ref 79–97)
Monocytes Absolute: 0.6 10*3/uL (ref 0.1–0.9)
Monocytes: 6 %
Neutrophils Absolute: 6.5 10*3/uL (ref 1.4–7.0)
Neutrophils: 66 %
Platelets: 310 10*3/uL (ref 150–450)
RBC: 4.86 x10E6/uL (ref 3.77–5.28)
RDW: 13.1 % (ref 11.7–15.4)
WBC: 9.9 10*3/uL (ref 3.4–10.8)

## 2019-01-13 LAB — URINALYSIS, ROUTINE W REFLEX MICROSCOPIC
Bilirubin, UA: NEGATIVE
Glucose, UA: NEGATIVE
Ketones, UA: NEGATIVE
Leukocytes,UA: NEGATIVE
Nitrite, UA: NEGATIVE
Protein,UA: NEGATIVE
Specific Gravity, UA: 1.006 (ref 1.005–1.030)
Urobilinogen, Ur: 0.2 mg/dL (ref 0.2–1.0)
pH, UA: 6.5 (ref 5.0–7.5)

## 2019-01-13 LAB — COMPREHENSIVE METABOLIC PANEL
ALT: 24 IU/L (ref 0–32)
AST: 28 IU/L (ref 0–40)
Albumin/Globulin Ratio: 1.6 (ref 1.2–2.2)
Albumin: 4.3 g/dL (ref 3.8–4.9)
Alkaline Phosphatase: 110 IU/L (ref 39–117)
BUN/Creatinine Ratio: 21 (ref 9–23)
BUN: 15 mg/dL (ref 6–24)
Bilirubin Total: <0.2 mg/dL (ref 0.0–1.2)
CO2: 23 mmol/L (ref 20–29)
Calcium: 9.7 mg/dL (ref 8.7–10.2)
Chloride: 100 mmol/L (ref 96–106)
Creatinine, Ser: 0.73 mg/dL (ref 0.57–1.00)
GFR calc Af Amer: 104 mL/min/{1.73_m2} (ref >59)
GFR calc non Af Amer: 90 mL/min/{1.73_m2} (ref >59)
Globulin, Total: 2.7 g/dL (ref 1.5–4.5)
Glucose: 157 mg/dL — ABNORMAL HIGH (ref 65–99)
Potassium: 3.4 mmol/L — ABNORMAL LOW (ref 3.5–5.2)
Sodium: 140 mmol/L (ref 134–144)
Total Protein: 7 g/dL (ref 6.0–8.5)

## 2019-01-13 LAB — MICROSCOPIC EXAMINATION
Bacteria, UA: NONE SEEN
Casts: NONE SEEN /lpf

## 2019-01-13 LAB — HEMOGLOBIN A1C
Est. average glucose Bld gHb Est-mCnc: 143 mg/dL
Hgb A1c MFr Bld: 6.6 % — ABNORMAL HIGH (ref 4.8–5.6)

## 2019-01-13 LAB — URINE CULTURE: Organism ID, Bacteria: NO GROWTH

## 2019-01-13 LAB — MAGNESIUM: Magnesium: 2 mg/dL (ref 1.6–2.3)

## 2019-01-16 LAB — STOOL CULTURE: E coli, Shiga toxin Assay: NEGATIVE

## 2019-01-20 ENCOUNTER — Other Ambulatory Visit: Payer: Self-pay | Admitting: Internal Medicine

## 2019-01-20 DIAGNOSIS — R197 Diarrhea, unspecified: Secondary | ICD-10-CM

## 2019-01-24 ENCOUNTER — Encounter: Payer: Self-pay | Admitting: Gastroenterology

## 2019-01-25 ENCOUNTER — Ambulatory Visit (INDEPENDENT_AMBULATORY_CARE_PROVIDER_SITE_OTHER): Payer: No Typology Code available for payment source | Admitting: Gastroenterology

## 2019-01-25 ENCOUNTER — Encounter: Payer: Self-pay | Admitting: Gastroenterology

## 2019-01-25 ENCOUNTER — Ambulatory Visit: Payer: No Typology Code available for payment source | Admitting: Gastroenterology

## 2019-01-25 ENCOUNTER — Ambulatory Visit: Payer: No Typology Code available for payment source | Admitting: Internal Medicine

## 2019-01-25 ENCOUNTER — Other Ambulatory Visit: Payer: Self-pay

## 2019-01-25 VITALS — BP 159/78 | HR 72 | Temp 97.8°F | Resp 17 | Ht 63.0 in | Wt 192.0 lb

## 2019-01-25 DIAGNOSIS — K219 Gastro-esophageal reflux disease without esophagitis: Secondary | ICD-10-CM

## 2019-01-25 DIAGNOSIS — K529 Noninfective gastroenteritis and colitis, unspecified: Secondary | ICD-10-CM

## 2019-01-25 DIAGNOSIS — R109 Unspecified abdominal pain: Secondary | ICD-10-CM

## 2019-01-25 NOTE — Progress Notes (Signed)
Cephas Darby, MD [address]  [place]  [place], [place] [address]  Main: [phone]  Fax: [phone]    Gastroenterology Consultation  Referring Provider:     McLean-Scocuzza, Olivia Mackie * Primary Care Physician:  McLean-Scocuzza, Nino Glow, MD Primary Gastroenterologist:  Dr. Cephas Darby Reason for Consultation: Chronic diarrhea        HPI:   Kristin [name] is a 59 y.o. female referred by Dr. Terese Door, Nino Glow, MD  for consultation & management of chronic diarrhea.  Patient reports that she started experiencing nonbloody watery bowel movements since beginning of October, primarily postprandial abdominal cramps in the left mid quadrant followed by a BM.  This has restricted her travel to [place] to take care of her mother.  She underwent work-up including stool studies to rule out infection and came back negative.  She stopped Celebrex thinking that it may have caused diarrhea but unchanged.  She denies any new medications 3 months.  She does have mild diabetes.  TSH is normal.  No evidence of anemia, normal CMP.  Also, limited intake of food due to postprandial urgency that has resulted in few pounds of weight loss.  She is currently taking Pepto-Bismol and Imodium as needed which is controlling her diarrhea. She also reports chronic intermittent heartburn for which she takes omeprazole 20 mg at bedtime. She denies nocturnal heartburn She does have chronic migraine and currently on multiple medications, followed by neurology  She used to work at [employer].  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 Allergy   . Arthritis   . Basal cell carcinoma    face, back, neck, arm   . Basal cell carcinoma    nose, left cheek  . Chicken pox   . Complication of anesthesia    blood pressure elevated after gastric sleeve surgery in recovery, pt does not want mask over face  . Depression   . Diabetes mellitus without complication ([place])    pre-diabetic currently, was diabetic prior to gastric sleeve surgery   . Fatty liver   . GERD (gastroesophageal reflux disease)   . Gout   . History of shingles   . Hyperlipidemia   . Hypertension    no problems since losing weight after gastric sleeve surgery  . Insomnia   . Migraine   . Obesity   . Osteopenia     Past Surgical History:  Procedure Laterality Date  . ABDOMINAL HYSTERECTOMY     no h/o abnormal pap still has 1 ovary left  . ANKLE ARTHROSCOPY Left 05/29/2016   Procedure: ANKLE ARTHROSCOPY;  Surgeon: [name], DPM;  Location: [facility];  Service: Podiatry;  Laterality: Left;  . APPENDECTOMY    . basal cell removed    . [place]  . CHOLECYSTECTOMY    . cyst removed from wrist    . FOOT SURGERY     left foot 11/2016 Dr. Laymond Purser   . left elbow surgery     07/2017 Dr Leanor Kail epicondylitis   . MOHS SURGERY     x 2 face nose and left cheek   . OSTEOCHONDROMA EXCISION Left 05/29/2016   Procedure: OSTEOCHONDRAL DRILLING TALUS;  Surgeon: [name], DPM;  Location: [facility];  Service: Podiatry;  Laterality: Left;  . PLANTAR FASCIA RELEASE Left 05/29/2016   Procedure: ENDOSCOPIC PLANTAR FASCIOTOMY;  Surgeon: [name], DPM;  Location: [facility];  Service: Podiatry;  Laterality: Left;  . ROTATOR CUFF REPAIR     lt shoulder  . STOMACH SURGERY  2017   gastric sleeve surgery  . TENNIS ELBOW RELEASE/NIRSCHEL PROCEDURE Left 08/05/2017   Procedure: TENNIS ELBOW RELEASE/NIRSCHEL PROCEDURE;  Surgeon: Leanor Kail, MD;  Location: [facility];  Service: Orthopedics;  Laterality: Left;  . TUBAL LIGATION    . wedge resection of ovary  1982    Current Outpatient Medications:  .   amLODipine (NORVASC) 5 MG tablet, Take 1 tablet (5 mg total) by mouth daily. And stop 2.5 mg daily (2 pills), Disp: 90 tablet, Rfl: 3 .  Cholecalciferol (VITAMIN D) 125 MCG (5000 UT) CAPS, , Disp: , Rfl:  .  estradiol (ESTRACE) 1 MG tablet, Take 1 tablet (1 mg total) by mouth daily., Disp: 90 tablet, Rfl: 3 .  fexofenadine (ALLEGRA) 180 MG tablet, Take 1 tablet (180 mg total) by mouth daily as needed for allergies or rhinitis., Disp: 90 tablet, Rfl: 3 .  FLUoxetine (PROZAC) 40 MG capsule, Take 1 capsule (40 mg total) by mouth daily., Disp: 90 capsule, Rfl: 3 .  Galcanezumab-gnlm (EMGALITY) 120 MG/ML SOAJ, Inject 120 mg into the skin every 30 (thirty) days. Appointment needed for further refills., Disp: 1 pen, Rfl: 1 .  hydrochlorothiazide (HYDRODIURIL) 25 MG tablet, Take 1 tablet (25 mg total) by mouth daily. In am, Disp: 90 tablet, Rfl: 3 .  omeprazole (PRILOSEC) 20 MG capsule, Take 20 mg by mouth daily as needed (indigestion)., Disp: , Rfl:  .  ondansetron (ZOFRAN-ODT) 4 MG disintegrating tablet, TAKE 1 TABLET BY MOUTH EVERY 8 HOURS AS NEEDED FOR NAUSEA, Disp: 12 tablet, Rfl: 4 .  Rimegepant Sulfate (NURTEC) 75 MG TBDP, Take 75 mg by mouth daily as needed (take for abortive therapy of migraine, no more than 1 tablet in 24 hours or 10 per month)., Disp: 10 tablet, Rfl: 11 .  rizatriptan (MAXALT-MLT) 10 MG disintegrating tablet, Take 1 tablet (10 mg total) by mouth as needed for migraine. May repeat in 2 hours if needed, Disp: 10 tablet, Rfl: 11 .  rosuvastatin (CRESTOR) 20 MG tablet, Take 1 tablet (20 mg total) by mouth at bedtime., Disp: 90 tablet, Rfl: 3 .  topiramate (TOPAMAX) 50 MG tablet, Take 1 tablet (50 mg total) by mouth daily., Disp: 30 tablet, Rfl: 11 .  Turmeric 500 MG CAPS, , Disp: , Rfl:  .  zolpidem (AMBIEN CR) 12.5 MG CR tablet, Take 1 tablet (12.5 mg total) by mouth at bedtime as needed., Disp: 30 tablet, Rfl: 5 .  celecoxib (CELEBREX) 100 MG capsule, Take by mouth., Disp: , Rfl:     Family History  Problem Relation Age of Onset  . Heart disease Mother   . Breast cancer Mother 75       metastatic, now hip, spine, and leg   . Arthritis Mother   . Hyperlipidemia Mother   . Cancer Mother        spinal surgery. breast dx'ed age 39 now metastatic 36 as of 07/13/18  . Cystic fibrosis Mother        carrier   . Heart disease Father   . Lung cancer Father   . Hyperlipidemia Father   . Hypertension Father   . Diabetes Sister   .  Diabetes Maternal Aunt   . Diabetes Maternal Uncle   . Diabetes Maternal Grandfather   . Hyperlipidemia Maternal Grandfather   . Arthritis Maternal Grandmother   . Sudden death Cousin   . Diabetes Maternal Aunt   . Diabetes Sister   . Cystic fibrosis Other        great niece   . Colon cancer Neg Hx   . Ovarian cancer Neg Hx      Social History   Tobacco Use  . Smoking status: Never Smoker  . Smokeless tobacco: Never Used  Substance Use Topics  . Alcohol use: Never  . Drug use: Never    Allergies as of 01/25/2019 - Review Complete 01/25/2019  Allergen Reaction Noted  . Tetracyclines & related Swelling and Rash 06/04/2014  . Amoxicillin  12/10/2018  . Alcohol Rash 12/07/2014  . Azithromycin Rash 07/19/2018  . Metoprolol Diarrhea 12/07/2014    Review of Systems:    All systems reviewed and negative except where noted in HPI.   Physical Exam:  BP (!) 159/78 (BP Location: Left Arm, Patient Position: Sitting, Cuff Size: Large)   Pulse 72   Temp 97.8 F (36.6 C)   Resp 17   Ht 5\' 3"  (1.6 m)   Wt 192 lb (87.1 kg)   BMI 34.01 kg/m  No LMP recorded. Patient has had a hysterectomy.  General:   Alert,  Well-developed, well-nourished, pleasant and cooperative in NAD Head:  Normocephalic and atraumatic. Eyes:  Sclera clear, no icterus.   Conjunctiva pink. Ears:  Normal auditory acuity. Nose:  No deformity, discharge, or lesions. Mouth:  No deformity or lesions,oropharynx pink & moist. Neck:  Supple; no masses or  thyromegaly. Lungs:  Respirations even and unlabored.  Clear throughout to auscultation.   No wheezes, crackles, or rhonchi. No acute distress. Heart:  Regular rate and rhythm; no murmurs, clicks, rubs, or gallops. Abdomen:  Normal bowel sounds. Soft, non-tender and non-distended without masses, hepatosplenomegaly or hernias noted.  No guarding or rebound tenderness.   Rectal: Not performed Msk:  Symmetrical without gross deformities. Good, equal movement & strength bilaterally. Pulses:  Normal pulses noted. Extremities:  No clubbing or edema.  No cyanosis. Neurologic:  Alert and oriented x3;  grossly normal neurologically. Skin:  Intact without significant lesions or rashes. No jaundice. Psych:  Alert and cooperative. Normal mood and affect.  Imaging Studies: Reviewed  Assessment and Plan:   Nelissa Oehlert Gamero is a 59 y.o. white female with BMI 34, metabolic syndrome seen in consultation for chronic diarrhea and chronic GERD  Chronic diarrhea Infectious work-up negative Recommend EGD and colonoscopy with biopsies for further evaluation Differentials include microscopic colitis and celiac disease bowel disease diarrhea predominant IBS Advised her to continue Pepto-Bismol or Imodium as she responds well  Chronic GERD Increase omeprazole to 20 mg twice daily before meals Advised about antireflux lifestyle  Fatty liver Monitor LFTs annually Discussed about control of diabetes, exercise   Follow up in 1 month   Cephas Darby, MD

## 2019-01-25 NOTE — Telephone Encounter (Signed)
Moved appointment from 03/03/2019 to 01/25/2019

## 2019-01-27 ENCOUNTER — Other Ambulatory Visit: Payer: Self-pay

## 2019-01-27 ENCOUNTER — Other Ambulatory Visit
Admission: RE | Admit: 2019-01-27 | Discharge: 2019-01-27 | Disposition: A | Discharge status: Home / Self Care | Payer: No Typology Code available for payment source | Source: Ambulatory Visit | Attending: Gastroenterology | Admitting: Gastroenterology

## 2019-01-27 DIAGNOSIS — Z20822 Contact with and (suspected) exposure to covid-19: Secondary | ICD-10-CM | POA: Insufficient documentation

## 2019-01-27 DIAGNOSIS — Z01812 Encounter for preprocedural laboratory examination: Secondary | ICD-10-CM | POA: Diagnosis present

## 2019-01-28 ENCOUNTER — Telehealth: Payer: Self-pay | Admitting: Gastroenterology

## 2019-01-28 ENCOUNTER — Other Ambulatory Visit: Payer: Self-pay

## 2019-01-28 LAB — SARS CORONAVIRUS 2 (TAT 6-24 HRS): SARS Coronavirus 2: NEGATIVE

## 2019-01-28 NOTE — Telephone Encounter (Signed)
Tyrone from [employer] is calling stating pt has procedure on 01/31/2019 and there is no authorization on file

## 2019-01-28 NOTE — Telephone Encounter (Signed)
Patient has been contacted in regards to colonoscopy bowel prep.  Bowel prep has been sent for Clenpiq to [facility] in [facility].  Will call and advise pt on bowel prep instructions.  Thanks [employer]

## 2019-01-31 ENCOUNTER — Encounter
Admission: RE | Disposition: A | Discharge status: Home / Self Care | Payer: Self-pay | Source: Home / Self Care | Attending: Gastroenterology

## 2019-01-31 ENCOUNTER — Ambulatory Visit: Payer: No Typology Code available for payment source | Admitting: Certified Registered Nurse Anesthetist

## 2019-01-31 ENCOUNTER — Other Ambulatory Visit: Payer: Self-pay

## 2019-01-31 ENCOUNTER — Ambulatory Visit
Admission: RE | Admit: 2019-01-31 | Discharge: 2019-01-31 | Disposition: A | Discharge status: Home / Self Care | Payer: No Typology Code available for payment source | Attending: Gastroenterology | Admitting: Gastroenterology

## 2019-01-31 DIAGNOSIS — Z9884 Bariatric surgery status: Secondary | ICD-10-CM | POA: Insufficient documentation

## 2019-01-31 DIAGNOSIS — D125 Benign neoplasm of sigmoid colon: Secondary | ICD-10-CM | POA: Insufficient documentation

## 2019-01-31 DIAGNOSIS — Z79899 Other long term (current) drug therapy: Secondary | ICD-10-CM | POA: Insufficient documentation

## 2019-01-31 DIAGNOSIS — Z85828 Personal history of other malignant neoplasm of skin: Secondary | ICD-10-CM | POA: Diagnosis not present

## 2019-01-31 DIAGNOSIS — K219 Gastro-esophageal reflux disease without esophagitis: Secondary | ICD-10-CM | POA: Diagnosis not present

## 2019-01-31 DIAGNOSIS — M8580 Other specified disorders of bone density and structure, unspecified site: Secondary | ICD-10-CM | POA: Insufficient documentation

## 2019-01-31 DIAGNOSIS — Z8249 Family history of ischemic heart disease and other diseases of the circulatory system: Secondary | ICD-10-CM | POA: Diagnosis not present

## 2019-01-31 DIAGNOSIS — E119 Type 2 diabetes mellitus without complications: Secondary | ICD-10-CM | POA: Diagnosis not present

## 2019-01-31 DIAGNOSIS — R12 Heartburn: Secondary | ICD-10-CM

## 2019-01-31 DIAGNOSIS — Z79890 Hormone replacement therapy (postmenopausal): Secondary | ICD-10-CM | POA: Insufficient documentation

## 2019-01-31 DIAGNOSIS — Z880 Allergy status to penicillin: Secondary | ICD-10-CM | POA: Diagnosis not present

## 2019-01-31 DIAGNOSIS — M1990 Unspecified osteoarthritis, unspecified site: Secondary | ICD-10-CM | POA: Diagnosis not present

## 2019-01-31 DIAGNOSIS — R197 Diarrhea, unspecified: Secondary | ICD-10-CM | POA: Diagnosis not present

## 2019-01-31 DIAGNOSIS — Z6833 Body mass index (BMI) 33.0-33.9, adult: Secondary | ICD-10-CM | POA: Insufficient documentation

## 2019-01-31 DIAGNOSIS — K529 Noninfective gastroenteritis and colitis, unspecified: Secondary | ICD-10-CM | POA: Diagnosis present

## 2019-01-31 DIAGNOSIS — Z8261 Family history of arthritis: Secondary | ICD-10-CM | POA: Insufficient documentation

## 2019-01-31 DIAGNOSIS — I10 Essential (primary) hypertension: Secondary | ICD-10-CM | POA: Diagnosis not present

## 2019-01-31 DIAGNOSIS — R109 Unspecified abdominal pain: Secondary | ICD-10-CM

## 2019-01-31 DIAGNOSIS — K649 Unspecified hemorrhoids: Secondary | ICD-10-CM | POA: Diagnosis not present

## 2019-01-31 DIAGNOSIS — E785 Hyperlipidemia, unspecified: Secondary | ICD-10-CM | POA: Diagnosis not present

## 2019-01-31 DIAGNOSIS — K760 Fatty (change of) liver, not elsewhere classified: Secondary | ICD-10-CM | POA: Diagnosis not present

## 2019-01-31 DIAGNOSIS — Z881 Allergy status to other antibiotic agents status: Secondary | ICD-10-CM | POA: Insufficient documentation

## 2019-01-31 DIAGNOSIS — Z833 Family history of diabetes mellitus: Secondary | ICD-10-CM | POA: Insufficient documentation

## 2019-01-31 DIAGNOSIS — Z888 Allergy status to other drugs, medicaments and biological substances status: Secondary | ICD-10-CM | POA: Diagnosis not present

## 2019-01-31 DIAGNOSIS — E669 Obesity, unspecified: Secondary | ICD-10-CM | POA: Diagnosis not present

## 2019-01-31 HISTORY — PX: COLONOSCOPY WITH PROPOFOL: SHX5780

## 2019-01-31 HISTORY — PX: ESOPHAGOGASTRODUODENOSCOPY (EGD) WITH PROPOFOL: SHX5813

## 2019-01-31 SURGERY — COLONOSCOPY WITH PROPOFOL
Anesthesia: General

## 2019-01-31 MED ORDER — LIDOCAINE HCL (CARDIAC) PF 100 MG/5ML IV SOSY
PREFILLED_SYRINGE | INTRAVENOUS | Status: DC | PRN
  Administered 2019-01-31: 80 mg via INTRAVENOUS
  Administered 2019-01-31: 20 mg via INTRAVENOUS

## 2019-01-31 MED ORDER — PROPOFOL 500 MG/50ML IV EMUL
INTRAVENOUS | Status: DC | PRN
  Administered 2019-01-31: 150 ug/kg/min via INTRAVENOUS

## 2019-01-31 MED ORDER — SUCCINYLCHOLINE CHLORIDE 20 MG/ML IJ SOLN
INTRAMUSCULAR | Status: AC
  Filled 2019-01-31: qty 1

## 2019-01-31 MED ORDER — SODIUM CHLORIDE 0.9 % IV SOLN
INTRAVENOUS | Status: DC
  Administered 2019-01-31: 08:00:00 1000 mL via INTRAVENOUS

## 2019-01-31 MED ORDER — PHENYLEPHRINE HCL (PRESSORS) 10 MG/ML IV SOLN
INTRAVENOUS | Status: AC
  Filled 2019-01-31: qty 1

## 2019-01-31 MED ORDER — LIDOCAINE HCL (PF) 2 % IJ SOLN
INTRAMUSCULAR | Status: AC
  Filled 2019-01-31: qty 10

## 2019-01-31 MED ORDER — PROPOFOL 10 MG/ML IV BOLUS
INTRAVENOUS | Status: AC
  Filled 2019-01-31: qty 20

## 2019-01-31 MED ORDER — EPHEDRINE SULFATE 50 MG/ML IJ SOLN
INTRAMUSCULAR | Status: AC
  Filled 2019-01-31: qty 1

## 2019-01-31 MED ORDER — PROPOFOL 10 MG/ML IV BOLUS
INTRAVENOUS | Status: DC | PRN
  Administered 2019-01-31: 40 mg via INTRAVENOUS
  Administered 2019-01-31: 10 mg via INTRAVENOUS
  Administered 2019-01-31: 50 mg via INTRAVENOUS

## 2019-01-31 MED ORDER — SEVOFLURANE IN SOLN
RESPIRATORY_TRACT | Status: AC
  Filled 2019-01-31: qty 250

## 2019-01-31 MED ORDER — PROPOFOL 500 MG/50ML IV EMUL
INTRAVENOUS | Status: AC
  Filled 2019-01-31: qty 50

## 2019-01-31 MED ORDER — SODIUM CHLORIDE 0.9 % IV SOLN: INTRAVENOUS | Status: DC | PRN

## 2019-01-31 NOTE — Op Note (Signed)
[facility] Gastroenterology Patient Name: Kristin [name] Procedure Date: 01/31/2019 8:46 AM MRN: [id number] Account #: [id number] Date of Birth: 10/16/1960 Admit Type: Outpatient Age: 59 Room: [facility] ENDO ROOM 3 Gender: Female Note Status: Finalized Procedure:             Upper GI endoscopy Indications:           Heartburn, Diarrhea Providers:             Lin Landsman MD, MD Referring MD:          Nino Glow Mclean-Scocuzza MD, MD (Referring MD) Medicines:             Monitored Anesthesia Care Complications:         No immediate complications. Estimated blood loss: None. Procedure:             Pre-Anesthesia Assessment:                        - Prior to the procedure, a History and Physical was                         performed, and patient medications and allergies were                         reviewed. The patient is competent. The risks and                         benefits of the procedure and the sedation options and                         risks were discussed with the patient. All questions                         were answered and informed consent was obtained.                         Patient identification and proposed procedure were                         verified by the physician, the nurse, the                         anesthesiologist, the anesthetist and the technician                         in the pre-procedure area in the procedure room in the                         endoscopy suite. Mental Status Examination: alert and                         oriented. Airway Examination: normal oropharyngeal                         airway and neck mobility. Respiratory Examination:                         clear to auscultation. CV Examination: normal.  Prophylactic Antibiotics: The patient does not require                         prophylactic antibiotics. Prior Anticoagulants: The                         patient has taken no previous  anticoagulant or                         antiplatelet agents. ASA Grade Assessment: III - A                         patient with severe systemic disease. After reviewing                         the risks and benefits, the patient was deemed in                         satisfactory condition to undergo the procedure. The                         anesthesia plan was to use monitored anesthesia care                         (MAC). Immediately prior to administration of                         medications, the patient was re-assessed for adequacy                         to receive sedatives. The heart rate, respiratory                         rate, oxygen saturations, blood pressure, adequacy of                         pulmonary ventilation, and response to care were                         monitored throughout the procedure. The physical                         status of the patient was re-assessed after the                         procedure.                        After obtaining informed consent, the endoscope was                         passed under direct vision. Throughout the procedure,                         the patient's blood pressure, pulse, and oxygen                         saturations were monitored continuously. The Endoscope  was introduced through the mouth, and advanced to the                         second part of duodenum. The upper GI endoscopy was                         accomplished without difficulty. The patient tolerated                         the procedure well. Findings:      The duodenal bulb and second portion of the duodenum were normal.       Biopsies for histology were taken with a cold forceps for evaluation of       celiac disease.      The entire examined stomach was normal. Biopsies were taken with a cold       forceps for Helicobacter pylori testing.      Esophagogastric landmarks were identified: the gastroesophageal junction        was found at 36 cm from the incisors.      The gastroesophageal junction and examined esophagus were normal. Impression:            - Normal duodenal bulb and second portion of the                         duodenum. Biopsied.                        - Normal stomach. Biopsied.                        - Esophagogastric landmarks identified.                        - Normal gastroesophageal junction and esophagus. Recommendation:        - Await pathology results.                        - Proceed with colonoscopy as scheduled                        See colonoscopy report Procedure Code(s):     --- Professional ---                        [phone], Esophagogastroduodenoscopy, flexible,                         transoral; with biopsy, single or multiple Diagnosis Code(s):     --- Professional ---                        R12, Heartburn                        R19.7, Diarrhea, unspecified CPT copyright 2019 American Medical Association. All rights reserved. The codes documented in this report are preliminary and upon coder review may  be revised to meet current compliance requirements. Dr. Ulyess Mort Lin Landsman MD, MD 01/31/2019 8:59:27 AM This report has been signed electronically. Number of Addenda: 0 Note Initiated On: 01/31/2019 8:46 AM Estimated Blood Loss:  Estimated blood loss: none.  [facility]

## 2019-01-31 NOTE — Anesthesia Preprocedure Evaluation (Signed)
Anesthesia Evaluation  Patient identified by MRN, date of birth, ID band Patient awake    Reviewed: Allergy & Precautions, NPO status , Patient's Chart, lab work & pertinent test results  History of Anesthesia Complications (+) history of anesthetic complications (Pt with struggles with mask post-op)  Airway Mallampati: III       Dental   Pulmonary sleep apnea (prior to gastric sleeve) , neg COPD, Not current smoker,           Cardiovascular hypertension, Pt. on medications (-) Past MI and (-) CHF (-) dysrhythmias (-) Valvular Problems/Murmurs     Neuro/Psych neg Seizures Anxiety Depression    GI/Hepatic Neg liver ROS, GERD  Medicated and Poorly Controlled,  Endo/Other  diabetes (deit controlled)  Renal/GU negative Renal ROS     Musculoskeletal   Abdominal   Peds  Hematology   Anesthesia Other Findings   Reproductive/Obstetrics                             Anesthesia Physical Anesthesia Plan  ASA: III  Anesthesia Plan: General   Post-op Pain Management:    Induction: Intravenous  PONV Risk Score and Plan: 3 and Propofol infusion, TIVA and Treatment may vary due to age or medical condition  Airway Management Planned: Nasal Cannula  Additional Equipment:   Intra-op Plan:   Post-operative Plan:   Informed Consent: I have reviewed the patients History and Physical, chart, labs and discussed the procedure including the risks, benefits and alternatives for the proposed anesthesia with the patient or authorized representative who has indicated his/her understanding and acceptance.       Plan Discussed with:   Anesthesia Plan Comments:         Anesthesia Quick Evaluation

## 2019-01-31 NOTE — H&P (Signed)
Cephas Darby, MD [address]  [place]  [place], [place] [address]  Main: [phone]  Fax: [phone] Pager: [phone]  Primary Care Physician:  McLean-Scocuzza, Nino Glow, MD Primary Gastroenterologist:  Dr. Cephas Darby  Pre-Procedure History & Physical: HPI:  Kristin [name] is a 59 y.o. female is here for an endoscopy and colonoscopy.   Past Medical History:  Diagnosis Date  . Allergy   . Arthritis   . Basal cell carcinoma    face, back, neck, arm  . Basal cell carcinoma    nose, left cheek  . Chicken pox   . Complication of anesthesia    blood pressure elevated after gastric sleeve surgery in recovery, pt does not want mask over face  . Depression   . Diabetes mellitus without complication ([place])    pre-diabetic currently, was diabetic prior to gastric sleeve surgery   . Fatty liver   . GERD (gastroesophageal reflux disease)   . Gout   . Hematuria 10/15/2018  . History of shingles   . Hyperlipidemia   . Hypertension    no problems since losing weight after gastric sleeve surgery  . Insomnia   . Migraine   . Obesity   . Osteopenia     Past Surgical History:  Procedure Laterality Date  . ABDOMINAL HYSTERECTOMY     no h/o abnormal pap still has 1 ovary left  . ANKLE ARTHROSCOPY Left 05/29/2016   Procedure: ANKLE ARTHROSCOPY;  Surgeon: [name], DPM;  Location: [facility];  Service: Podiatry;  Laterality: Left;  . APPENDECTOMY    . basal cell removed    . [place]  . CHOLECYSTECTOMY    . cyst removed from wrist    . FOOT SURGERY     left foot 11/2016 Dr. Laymond Purser   . left elbow surgery     07/2017 Dr Leanor Kail epicondylitis   . MOHS SURGERY     x 2 face nose and left cheek   . OSTEOCHONDROMA EXCISION Left 05/29/2016   Procedure: OSTEOCHONDRAL DRILLING TALUS;  Surgeon: [name], DPM;  Location: [facility];  Service: Podiatry;  Laterality: Left;  . PLANTAR FASCIA RELEASE Left 05/29/2016   Procedure:  ENDOSCOPIC PLANTAR FASCIOTOMY;  Surgeon: [name], DPM;  Location: [facility];  Service: Podiatry;  Laterality: Left;  . ROTATOR CUFF REPAIR     lt shoulder  . STOMACH SURGERY  2017   gastric sleeve surgery  . TENNIS ELBOW RELEASE/NIRSCHEL PROCEDURE Left 08/05/2017   Procedure: TENNIS ELBOW RELEASE/NIRSCHEL PROCEDURE;  Surgeon: Leanor Kail, MD;  Location: [facility];  Service: Orthopedics;  Laterality: Left;  . TUBAL LIGATION    . wedge resection of ovary  1982    Prior to Admission medications   Medication Sig Start Date End Date Taking? Authorizing Provider  amLODipine (NORVASC) 5 MG tablet Take 1 tablet (5 mg total) by mouth daily. And stop 2.5 mg daily (2 pills) 10/26/18  Yes McLean-Scocuzza, Nino Glow, MD  Cholecalciferol (VITAMIN D) 125 MCG (5000 UT) CAPS  09/21/18  Yes [provider]  estradiol (ESTRACE) 1 MG tablet Take 1 tablet (1 mg total) by mouth daily. 09/03/18  Yes Rubie Maid, MD  fexofenadine (ALLEGRA) 180 MG tablet Take 1 tablet (180 mg total) by mouth daily as needed for allergies or rhinitis. 09/01/18  Yes McLean-Scocuzza, Nino Glow, MD  FLUoxetine (PROZAC) 40 MG capsule Take 1 capsule (40 mg total) by mouth  daily. 05/06/18  Yes Lomax, Amy, NP  Galcanezumab-gnlm (EMGALITY) 120 MG/ML SOAJ Inject 120 mg into the skin every 30 (thirty) days. Appointment needed for further refills. 12/20/18  Yes Lomax, Amy, NP  hydrochlorothiazide (HYDRODIURIL) 25 MG tablet Take 1 tablet (25 mg total) by mouth daily. In am 12/10/18  Yes McLean-Scocuzza, Nino Glow, MD  omeprazole (PRILOSEC) 20 MG capsule Take 20 mg by mouth daily as needed (indigestion).   Yes [provider]  ondansetron (ZOFRAN-ODT) 4 MG disintegrating tablet TAKE 1 TABLET BY MOUTH EVERY 8 HOURS AS NEEDED FOR NAUSEA 12/13/18  Yes Lomax, Amy, NP  Rimegepant Sulfate (NURTEC) 75 MG TBDP Take 75 mg by mouth daily as needed (take for abortive therapy of migraine, no more than 1 tablet in 24 hours or 10 per month).  12/07/18  Yes Lomax, Amy, NP  rizatriptan (MAXALT-MLT) 10 MG disintegrating tablet Take 1 tablet (10 mg total) by mouth as needed for migraine. May repeat in 2 hours if needed 11/10/17  Yes Melvenia Beam, MD  rosuvastatin (CRESTOR) 20 MG tablet Take 1 tablet (20 mg total) by mouth at bedtime. 07/06/18  Yes McLean-Scocuzza, Nino Glow, MD  topiramate (TOPAMAX) 50 MG tablet Take 1 tablet (50 mg total) by mouth daily. 12/30/18  Yes Lomax, Amy, NP  Turmeric 500 MG CAPS  10/05/18  Yes [provider]  zolpidem (AMBIEN CR) 12.5 MG CR tablet Take 1 tablet (12.5 mg total) by mouth at bedtime as needed. 10/14/18  Yes McLean-Scocuzza, Nino Glow, MD  celecoxib (CELEBREX) 100 MG capsule Take by mouth. 06/27/18   [provider]    Allergies as of 01/25/2019 - Review Complete 01/25/2019  Allergen Reaction Noted  . Tetracyclines & related Swelling and Rash 06/04/2014  . Amoxicillin  12/10/2018  . Alcohol Rash 12/07/2014  . Azithromycin Rash 07/19/2018  . Metoprolol Diarrhea 12/07/2014    Family History  Problem Relation Age of Onset  . Heart disease Mother   . Breast cancer Mother 63       metastatic, now hip, spine, and leg   . Arthritis Mother   . Hyperlipidemia Mother   . Cancer Mother        spinal surgery. breast dx'ed age 54 now metastatic 31 as of 07/13/18  . Cystic fibrosis Mother        carrier   . Heart disease Father   . Lung cancer Father   . Hyperlipidemia Father   . Hypertension Father   . Diabetes Sister   . Diabetes Maternal Aunt   . Diabetes Maternal Uncle   . Diabetes Maternal Grandfather   . Hyperlipidemia Maternal Grandfather   . Arthritis Maternal Grandmother   . Sudden death Cousin   . Diabetes Maternal Aunt   . Diabetes Sister   . Cystic fibrosis Other        great niece   . Colon cancer Neg Hx   . Ovarian cancer Neg Hx     Social History   Socioeconomic History  . Marital status: Married    Spouse name: Not on file  . Number of children: 3  .  Years of education: Not on file  . Highest education level: Some college, no degree  Occupational History  . Not on file  Tobacco Use  . Smoking status: Never Smoker  . Smokeless tobacco: Never Used  Substance and Sexual Activity  . Alcohol use: Never  . Drug use: Never  . Sexual activity: Yes    Partners: Male  Birth control/protection: Surgical  Other Topics Concern  . Not on file  Social History Narrative   Married x 37 years as of 10/2017    Kids 3 boys youngest is Eddie Dibbles   Write childrens books    Retired from [employer] 01/2017    Enjoys traveling    Right handed   Caffeine: quit 2009   Social Determinants of Health   Financial Resource Strain:   . Difficulty of Paying Living Expenses: Not on file  Food Insecurity:   . Worried About Charity fundraiser in the Last Year: Not on file  . Ran Out of Food in the Last Year: Not on file  Transportation Needs:   . Lack of Transportation (Medical): Not on file  . Lack of Transportation (Non-Medical): Not on file  Physical Activity:   . Days of Exercise per Week: Not on file  . Minutes of Exercise per Session: Not on file  Stress:   . Feeling of Stress : Not on file  Social Connections:   . Frequency of Communication with Friends and Family: Not on file  . Frequency of Social Gatherings with Friends and Family: Not on file  . Attends Religious Services: Not on file  . Active Member of Clubs or Organizations: Not on file  . Attends Archivist Meetings: Not on file  . Marital Status: Not on file  Intimate Partner Violence:   . Fear of Current or Ex-Partner: Not on file  . Emotionally Abused: Not on file  . Physically Abused: Not on file  . Sexually Abused: Not on file    Review of Systems: See HPI, otherwise negative ROS  Physical Exam: BP (!) 147/82   Pulse 93   Temp (!) 96.3 F (35.7 C) (Tympanic)   Resp 20   Ht 5\' 3"  (1.6 m)   Wt 85.7 kg   SpO2 98%   BMI 33.48 kg/m  General:   Alert,  pleasant and  cooperative in NAD Head:  Normocephalic and atraumatic. Neck:  Supple; no masses or thyromegaly. Lungs:  Clear throughout to auscultation.    Heart:  Regular rate and rhythm. Abdomen:  Soft, nontender and nondistended. Normal bowel sounds, without guarding, and without rebound.   Neurologic:  Alert and  oriented x4;  grossly normal neurologically.  Impression/Plan: Kristin [name] is here for an endoscopy and colonoscopy to be performed for Chronic diarrhea  Risks, benefits, limitations, and alternatives regarding  endoscopy and colonoscopy have been reviewed with the patient.  Questions have been answered.  All parties agreeable.   Sherri Sear, MD  01/31/2019, 8:43 AM

## 2019-01-31 NOTE — Op Note (Signed)
[facility] Gastroenterology Patient Name: Margrette Wynia Procedure Date: 01/31/2019 8:45 AM MRN: [id number] Account #: [id number] Date of Birth: 02/15/1960 Admit Type: Outpatient Age: 59 Room: [facility] ENDO ROOM 3 Gender: Female Note Status: Finalized Procedure:             Colonoscopy Indications:           Chronic diarrhea, Clinically significant diarrhea of                         unexplained origin Providers:             Lin Landsman MD, MD Referring MD:          Nino Glow Mclean-Scocuzza MD, MD (Referring MD) Medicines:             Monitored Anesthesia Care Complications:         No immediate complications. Estimated blood loss: None. Procedure:             Pre-Anesthesia Assessment:                        - Prior to the procedure, a History and Physical was                         performed, and patient medications and allergies were                         reviewed. The patient is competent. The risks and                         benefits of the procedure and the sedation options and                         risks were discussed with the patient. All questions                         were answered and informed consent was obtained.                         Patient identification and proposed procedure were                         verified by the physician, the nurse, the                         anesthesiologist, the anesthetist and the technician                         in the pre-procedure area in the procedure room in the                         endoscopy suite. Mental Status Examination: alert and                         oriented. Airway Examination: normal oropharyngeal                         airway and neck mobility. Respiratory Examination:  clear to auscultation. CV Examination: normal.                         Prophylactic Antibiotics: The patient does not require                         prophylactic antibiotics. Prior  Anticoagulants: The                         patient has taken no previous anticoagulant or                         antiplatelet agents. ASA Grade Assessment: III - A                         patient with severe systemic disease. After reviewing                         the risks and benefits, the patient was deemed in                         satisfactory condition to undergo the procedure. The                         anesthesia plan was to use monitored anesthesia care                         (MAC). Immediately prior to administration of                         medications, the patient was re-assessed for adequacy                         to receive sedatives. The heart rate, respiratory                         rate, oxygen saturations, blood pressure, adequacy of                         pulmonary ventilation, and response to care were                         monitored throughout the procedure. The physical                         status of the patient was re-assessed after the                         procedure.                        After obtaining informed consent, the colonoscope was                         passed under direct vision. Throughout the procedure,                         the patient's blood pressure, pulse, and oxygen  saturations were monitored continuously. The                         Colonoscope was introduced through the anus and                         advanced to the the terminal ileum, with                         identification of the appendiceal orifice and IC                         valve. The colonoscopy was performed without                         difficulty. The patient tolerated the procedure well.                         The quality of the bowel preparation was fair. Findings:      Hemorrhoids were found on perianal exam.      The terminal ileum appeared normal.      The entire examined colon appeared normal. Biopsies for histology were        taken with a cold forceps from the entire colon for evaluation of       microscopic colitis.      The retroflexed view of the distal rectum and anal verge was normal and       showed no anal or rectal abnormalities. Impression:            - Preparation of the colon was fair.                        - Hemorrhoids found on perianal exam.                        - The examined portion of the ileum was normal.                        - The entire examined colon is normal. Biopsied.                        - The distal rectum and anal verge are normal on                         retroflexion view. Recommendation:        - Discharge patient to home (with escort).                        - Resume previous diet today.                        - Continue present medications.                        - Await pathology results.                        - Return to my office as previously scheduled. Procedure Code(s):     --- Professional ---  45380, Colonoscopy, flexible; with biopsy, single or                         multiple Diagnosis Code(s):     --- Professional ---                        K64.9, Unspecified hemorrhoids                        K52.9, Noninfective gastroenteritis and colitis,                         unspecified                        R19.7, Diarrhea, unspecified CPT copyright 2019 American Medical Association. All rights reserved. The codes documented in this report are preliminary and upon coder review may  be revised to meet current compliance requirements. Dr. Ulyess Mort Lin Landsman MD, MD 01/31/2019 9:19:04 AM This report has been signed electronically. Number of Addenda: 0 Note Initiated On: 01/31/2019 8:45 AM Scope Withdrawal Time: 0 hours 10 minutes 41 seconds  Total Procedure Duration: 0 hours 15 minutes 47 seconds  Estimated Blood Loss:  Estimated blood loss: none.      [facility]

## 2019-01-31 NOTE — Transfer of Care (Signed)
Immediate Anesthesia Transfer of Care Note  Patient: Kristin [name]  Procedure(s) Performed: COLONOSCOPY WITH PROPOFOL (N/A ) ESOPHAGOGASTRODUODENOSCOPY (EGD) WITH PROPOFOL (N/A )  Patient Location: PACU and Endoscopy Unit  Anesthesia Type:General  Level of Consciousness: sedated, drowsy and patient cooperative  Airway & Oxygen Therapy: Patient Spontanous Breathing  Post-op Assessment: Report given to RN and Post -op Vital signs reviewed and stable  Post vital signs: Reviewed and stable  Last Vitals:  Vitals Value Taken Time  BP    Temp 35.6 C 01/31/19 0920  Pulse 68 01/31/19 0922  Resp 15 01/31/19 0922  SpO2 97 % 01/31/19 0922  Vitals shown include unvalidated device data.  Last Pain:  Vitals:   01/31/19 0920  TempSrc: Temporal  PainSc:          Complications: No apparent anesthesia complications

## 2019-01-31 NOTE — Anesthesia Postprocedure Evaluation (Signed)
Anesthesia Post Note  Patient: Kristin [name]  Procedure(s) Performed: COLONOSCOPY WITH PROPOFOL (N/A ) ESOPHAGOGASTRODUODENOSCOPY (EGD) WITH PROPOFOL (N/A )  Patient location during evaluation: Endoscopy Anesthesia Type: General Level of consciousness: awake and alert Pain management: pain level controlled Vital Signs Assessment: post-procedure vital signs reviewed and stable Respiratory status: spontaneous breathing and respiratory function stable Cardiovascular status: stable Anesthetic complications: no     Last Vitals:  Vitals:   01/31/19 0940 01/31/19 0941  BP:  (!) 152/74  Pulse: 63 64  Resp: 13 13  Temp:    SpO2: 99% 99%    Last Pain:  Vitals:   01/31/19 0920  TempSrc: Temporal  PainSc:                  [name]

## 2019-02-01 ENCOUNTER — Encounter: Payer: Self-pay | Admitting: *Deleted

## 2019-02-01 LAB — SURGICAL PATHOLOGY

## 2019-02-02 ENCOUNTER — Telehealth: Payer: Self-pay | Admitting: *Deleted

## 2019-02-02 ENCOUNTER — Encounter: Payer: Self-pay | Admitting: Gastroenterology

## 2019-02-02 NOTE — Telephone Encounter (Signed)
-----   Message from Debbora Presto, NP sent at 01/17/2019  4:21 PM EST ----- Hey! I received a message from PCP about patient having confusion? Can you get details for me and get her scheduled for follow up. She started topiramate about 6-8 weeks ago. It can cause brain fog but not typically confusion. TY! ----- Message ----- From: McLean-Scocuzza, Nino Glow, MD Sent: 01/17/2019   3:56 PM EST To: Debbora Presto, NP  See note 01/11/19 I think pt is having confusion from topamax only new change though it helps h/a she reports episodes of confusion  Can you address with her?   Thanks [employer]

## 2019-02-02 NOTE — Telephone Encounter (Signed)
I called pt. She stated that topamax helping migraines.  She states that she is having hardtime concentration, had episode of not remembering where her husband was ([employer]-familiar place).  Like trying to think thru fog.  She is taking topamax 50mg  po daily.  Made appt 02-17-19 at 800 AL/NP.

## 2019-02-03 ENCOUNTER — Encounter: Payer: Self-pay | Admitting: Gastroenterology

## 2019-02-03 ENCOUNTER — Other Ambulatory Visit: Payer: Self-pay | Admitting: Gastroenterology

## 2019-02-03 DIAGNOSIS — K580 Irritable bowel syndrome with diarrhea: Secondary | ICD-10-CM

## 2019-02-03 MED ORDER — AMITRIPTYLINE HCL 25 MG PO TABS: 25.0000 mg | ORAL_TABLET | Freq: Every day | ORAL | 2 refills | Status: DC

## 2019-02-10 ENCOUNTER — Other Ambulatory Visit: Payer: Self-pay | Admitting: Gastroenterology

## 2019-02-10 DIAGNOSIS — K580 Irritable bowel syndrome with diarrhea: Secondary | ICD-10-CM

## 2019-02-17 ENCOUNTER — Encounter: Payer: Self-pay | Admitting: Family Medicine

## 2019-02-17 ENCOUNTER — Other Ambulatory Visit: Payer: Self-pay

## 2019-02-17 ENCOUNTER — Ambulatory Visit (INDEPENDENT_AMBULATORY_CARE_PROVIDER_SITE_OTHER): Payer: No Typology Code available for payment source | Admitting: Family Medicine

## 2019-02-17 VITALS — BP 125/75 | HR 76 | Temp 97.3°F | Ht 63.0 in | Wt 190.2 lb

## 2019-02-17 DIAGNOSIS — G43711 Chronic migraine without aura, intractable, with status migrainosus: Secondary | ICD-10-CM | POA: Diagnosis not present

## 2019-02-17 DIAGNOSIS — R4789 Other speech disturbances: Secondary | ICD-10-CM | POA: Diagnosis not present

## 2019-02-17 MED ORDER — FLUOXETINE HCL 40 MG PO CAPS: 40.0000 mg | ORAL_CAPSULE | Freq: Every day | ORAL | 3 refills | Status: DC

## 2019-02-17 MED ORDER — NURTEC 75 MG PO TBDP: 75.0000 mg | ORAL_TABLET | Freq: Every day | ORAL | 11 refills | Status: DC | PRN

## 2019-02-17 MED ORDER — EMGALITY 120 MG/ML ~~LOC~~ SOAJ: 120.0000 mg | SUBCUTANEOUS | 11 refills | Status: DC

## 2019-02-17 NOTE — Progress Notes (Addendum)
PATIENT: Kristin [name] DOB: Jul 27, 1960  REASON FOR VISIT: follow up HISTORY FROM: patient  Chief Complaint  Patient presents with  . Follow-up    RM6. alone. States that her migraines have improved. Only had 1 migraine this month.     HISTORY OF PRESENT ILLNESS: Today 02/17/19 Kristin [name] is a 59 y.o. female here today for follow up for migraines. She continues Emgality monthly, Prozac '40mg'$  daily and topiramate '50mg'$  daily for migraine prevention and Nurtec for abortive therapy. She reports that migraines are significantly improved. She has 1-2 migraines per month. She has noticed more difficulty with decreased attention and word finding difficulty. She has trouble finding the right words with in conversation. Information usually comes back to her. She was seen by PCP following an episode where she was disoriented in [place]. She reports separating from her husband in the produce department. She went to grab another grocery item and when she tried to find her way back to the produce department, she was not sure which way to go. She was completely aware of her surroundings. She knew where she needed to go but lost her sense of direction. Her husband ended up finding her and she reports feeling completely normal afterwards. She also notes an event where she was following her husband to an auto shop. Once they arrived, her husband pulled into the auto repair parking lot and she continued to drive. She realized immediately after passing the auto shop that she should have turned and was able to navigate to the right place. She denies any other concerns of acute confusion. She denies any concerns of losing consciousness, unilateral weakness, speech changes, or difficulty walking/talking. MRI in 2019 unremarkable with exception of very minimal chronic microvascular changes or potentially sequela of migraines. MRI unchanged from 2012. She was recently started on amitriptyline by GI for IBS.  She is tolerating this medication well with no adverse effects. She is also taking Ambien 12.5CR.   HISTORY: (copied from my note on 11/08/2018)  Kristin [name] is a 59 y.o. female here today for follow up of migraines. She reports that over the month headaches have returned. She is having headaches more in the evenings. Not generally in the mornings and they do not wake her up. It does seem to be worse when she stands up. Headache is usually behind the left eye. Headache usually last about 30 minutes but can linger for hours or days. She is having about 16-20 headache days a month and reports that more than half of these are migrainous with light/sound sensitivity and nausea. She has continued Emgality monthly as well as Prozac 40 mg daily. She felt that this combo worked well in the beginning but has not been effective recently. She continues rizatriptan and Zofran for abortive therapy. This does seem to help. She is also taking  celecoxib '100mg'$  daily and Ambien 12.'5mg'$  at bedtime. She takes oxycodone/acetaminophen 5-325 rarely for pain. She has not used cyclobenzaprine recently. She reports that blood pressures have been up and down. Readings are between 120/80-177/81. She does have significantly allergies. She does have sinus pressure. She had chronic sinusitis noted on MRI in 10/2017. She does not snore. She does wake with dry mouth. She wakes frequently (1-3 times nightly) to urinate. She can not sleep without taking Ambien. She is "sleep eating." She does not remember eating during sleep. She has gained about 25 pounds in this past month. She reports being diagnosed with sleep apnea about  5 years ago. She had a gastric sleeve shortly afterwards and lost 50 pounds. She was retested and reports no sleep apnea. Unfortunately, she has gained back 35 pounds. She drinks about 20 ounces of water daily, diet cranberry juice and decaf tea. She splits time between here and [place] to care for her ill mother.      HISTORY: (copied from my note on 05/06/2018)  Kristin [name] a 59 y.o.femalefor follow up of migraines.She was started on Emgality and Prozac at her previous visit in 10/2017. She was also taking propranolol at that time but has discontinued this medication due toexcessive fatigue and drowsiness.She has been using rizatriptan and Zofran for abortive therapy.She reports that she is having 2-3 migraines per month. She does feel that her medications are helping significantly but is interested in increasing the Prozac dose. She feels that this helped tremendously with her headaches and stress levels. She does admit to continued stress and depression. She is trying to care for her husband and son at home while also caring for her mother who has cancer in [place]. She is splitting her time between the 2 homes. She denies any concerns of suicidal ideations.  History (copied from Dr Cathren Laine note 11/10/2017)  Interval history 11/10/2017: MR of the brain showed frontal chronic sinusitis, discussed seeing ENT. Propranolol is helping, the headaches are not as bad. She has been tested in the past recently 3 years ago and no sleep apnea. She is under more stress now. Her pulse is 60 cannot increase propranolol further. Discussed options, due to stress may start Prozac which is a migraine preventative and can help with stress. Zofran for dizziness or nausea. Also start Emgality.   Medications tried: propranolol, topiramate, prozac, nortriptyline, verapamil  Reviewed MRI of the brain images with patient and agree with th following: This MRI of the brain with and without contrast shows the following: 1. Couple punctate T2/FLAIR hyperintense foci in the subcortical white matter. These are also seen on the 2012 MRI. They are nonspecific but could represent very minimal chronic microvascular ischemic changes or sequela of migraine headaches. 2. Chronic right frontal  sinusitis. 3. There are no acute findings and there is a normal enhancement pattern.  Kristin [name] a 59 y.o.femalehere as requested by Dr. McLean-Scocuzzafor headaches. Migraines in the past was always on the left hand side. She was on Topamax 10 years ago she stopped headaches improved after coming off of caffeine and a massage once a month. She would have occ headaches but not severe not frequent. She started noticing headaches in may and became more severe in September every time she stood up out of the car the headache would be severe, noticed a pattern of worsening headache when standing. No lumbar punctures, no trauma, no new medications, no recent illnesses. She hydrates well. No OTC medication overuse. Mid September the afternoon worsened but lost the positional quality. Now every afternoon she has a headache. Headache is on the top of the head, tightness, worse if she leans over from the throbbing. No light sensitivity, + sound sensitivity. Can wake up with the headaches. Tylenol helps a little bit. Sleeping helps. No other focal neurologic deficits, associated symptoms, inciting events or modifiable factors.  Reviewed notes, labs and imaging from outside physicians, which showed:  Reviewed MRI brain images from 2012 which were normal  Esr/crp nml   REVIEW OF SYSTEMS: Out of a complete 14 system review of symptoms, the patient complains only of the following symptoms, headaches,  anxiety, insomnia and all other reviewed systems are negative.  ALLERGIES: Allergies  Allergen Reactions  . Tetracyclines & Related Swelling and Rash    FACE SWELL, RASH ON CHEST  . Amoxicillin   . Alcohol Rash    ABDOMINAL CRAMPS, drinking alcohol  . Azithromycin Rash  . Metoprolol Diarrhea    HOME MEDICATIONS: Outpatient Medications Prior to Visit  Medication Sig Dispense Refill  . amitriptyline (ELAVIL) 25 MG tablet TAKE 1 TABLET (25 MG TOTAL) BY MOUTH AT BEDTIME. INCREASE  TO '50MG'$  AFTER 2 WEEKS IF NEEDED 180 tablet 1  . amLODipine (NORVASC) 5 MG tablet Take 1 tablet (5 mg total) by mouth daily. And stop 2.5 mg daily (2 pills) 90 tablet 3  . Cholecalciferol (VITAMIN D) 125 MCG (5000 UT) CAPS     . estradiol (ESTRACE) 1 MG tablet Take 1 tablet (1 mg total) by mouth daily. 90 tablet 3  . fexofenadine (ALLEGRA) 180 MG tablet Take 1 tablet (180 mg total) by mouth daily as needed for allergies or rhinitis. 90 tablet 3  . hydrochlorothiazide (HYDRODIURIL) 25 MG tablet Take 1 tablet (25 mg total) by mouth daily. In am 90 tablet 3  . omeprazole (PRILOSEC) 20 MG capsule Take 20 mg by mouth daily as needed (indigestion).    . ondansetron (ZOFRAN-ODT) 4 MG disintegrating tablet TAKE 1 TABLET BY MOUTH EVERY 8 HOURS AS NEEDED FOR NAUSEA 12 tablet 4  . rosuvastatin (CRESTOR) 20 MG tablet Take 1 tablet (20 mg total) by mouth at bedtime. 90 tablet 3  . Turmeric 500 MG CAPS     . zolpidem (AMBIEN CR) 12.5 MG CR tablet Take 1 tablet (12.5 mg total) by mouth at bedtime as needed. 30 tablet 5  . FLUoxetine (PROZAC) 40 MG capsule Take 1 capsule (40 mg total) by mouth daily. 90 capsule 3  . Galcanezumab-gnlm (EMGALITY) 120 MG/ML SOAJ Inject 120 mg into the skin every 30 (thirty) days. Appointment needed for further refills. 1 pen 1  . Rimegepant Sulfate (NURTEC) 75 MG TBDP Take 75 mg by mouth daily as needed (take for abortive therapy of migraine, no more than 1 tablet in 24 hours or 10 per month). 10 tablet 11  . rizatriptan (MAXALT-MLT) 10 MG disintegrating tablet Take 1 tablet (10 mg total) by mouth as needed for migraine. May repeat in 2 hours if needed 10 tablet 11  . topiramate (TOPAMAX) 50 MG tablet Take 1 tablet (50 mg total) by mouth daily. 30 tablet 11  . celecoxib (CELEBREX) 100 MG capsule Take by mouth.     No facility-administered medications prior to visit.    PAST MEDICAL HISTORY: Past Medical History:  Diagnosis Date  . Allergy   . Arthritis   . Basal cell carcinoma     face, back, neck, arm  . Basal cell carcinoma    nose, left cheek  . Chicken pox   . Complication of anesthesia    blood pressure elevated after gastric sleeve surgery in recovery, pt does not want mask over face  . Depression   . Diabetes mellitus without complication ([place])    pre-diabetic currently, was diabetic prior to gastric sleeve surgery   . Fatty liver   . GERD (gastroesophageal reflux disease)   . Gout   . Hematuria 10/15/2018  . History of shingles   . Hyperlipidemia   . Hypertension    no problems since losing weight after gastric sleeve surgery  . Insomnia   . Migraine   . Obesity   .  Osteopenia     PAST SURGICAL HISTORY: Past Surgical History:  Procedure Laterality Date  . ABDOMINAL HYSTERECTOMY     no h/o abnormal pap still has 1 ovary left  . ANKLE ARTHROSCOPY Left 05/29/2016   Procedure: ANKLE ARTHROSCOPY;  Surgeon: [name], DPM;  Location: [facility];  Service: Podiatry;  Laterality: Left;  . APPENDECTOMY    . basal cell removed    . [place]  . CHOLECYSTECTOMY    . COLONOSCOPY WITH PROPOFOL N/A 01/31/2019   Procedure: COLONOSCOPY WITH PROPOFOL;  Surgeon: Lin Landsman, MD;  [facility] ENDOSCOPY;  Service: Gastroenterology;  Laterality: N/A;  . cyst removed from wrist    . ESOPHAGOGASTRODUODENOSCOPY (EGD) WITH PROPOFOL N/A 01/31/2019   Procedure: ESOPHAGOGASTRODUODENOSCOPY (EGD) WITH PROPOFOL;  Surgeon: Lin Landsman, MD;  Location: [facility];  Service: Gastroenterology;  Laterality: N/A;  . FOOT SURGERY     left foot 11/2016 Dr. Laymond Purser   . left elbow surgery     07/2017 Dr Leanor Kail epicondylitis   . MOHS SURGERY     x 2 face nose and left cheek   . OSTEOCHONDROMA EXCISION Left 05/29/2016   Procedure: OSTEOCHONDRAL DRILLING TALUS;  Surgeon: [name], DPM;  Location: [facility];  Service: Podiatry;  Laterality: Left;  . PLANTAR FASCIA RELEASE Left 05/29/2016   Procedure: ENDOSCOPIC PLANTAR  FASCIOTOMY;  Surgeon: [name], DPM;  Location: [facility];  Service: Podiatry;  Laterality: Left;  . ROTATOR CUFF REPAIR     lt shoulder  . STOMACH SURGERY  2017   gastric sleeve surgery  . TENNIS ELBOW RELEASE/NIRSCHEL PROCEDURE Left 08/05/2017   Procedure: TENNIS ELBOW RELEASE/NIRSCHEL PROCEDURE;  Surgeon: Leanor Kail, MD;  Location: [facility];  Service: Orthopedics;  Laterality: Left;  . TUBAL LIGATION    . wedge resection of ovary  1982    FAMILY HISTORY: Family History  Problem Relation Age of Onset  . Heart disease Mother   . Breast cancer Mother 57       metastatic, now hip, spine, and leg   . Arthritis Mother   . Hyperlipidemia Mother   . Cancer Mother        spinal surgery. breast dx'ed age 74 now metastatic 61 as of 07/13/18  . Cystic fibrosis Mother        carrier   . Heart disease Father   . Lung cancer Father   . Hyperlipidemia Father   . Hypertension Father   . Diabetes Sister   . Diabetes Maternal Aunt   . Diabetes Maternal Uncle   . Diabetes Maternal Grandfather   . Hyperlipidemia Maternal Grandfather   . Arthritis Maternal Grandmother   . Sudden death Cousin   . Diabetes Maternal Aunt   . Diabetes Sister   . Cystic fibrosis Other        great niece   . Colon cancer Neg Hx   . Ovarian cancer Neg Hx     SOCIAL HISTORY: Social History   Socioeconomic History  . Marital status: Married    Spouse name: Not on file  . Number of children: 3  . Years of education: Not on file  . Highest education level: Some college, no degree  Occupational History  . Not on file  Tobacco Use  . Smoking status: Never Smoker  . Smokeless tobacco: Never Used  Substance and Sexual Activity  . Alcohol use: Never  . Drug use: Never  .  Sexual activity: Yes    Partners: Male    Birth control/protection: Surgical  Other Topics Concern  . Not on file  Social History Narrative   Married x 37 years as of 10/2017    Kids 3 boys youngest is Eddie Dibbles   Write childrens  books    Retired from [employer] 01/2017    Enjoys traveling    Right handed   Caffeine: quit 2009   Social Determinants of Health   Financial Resource Strain:   . Difficulty of Paying Living Expenses: Not on file  Food Insecurity:   . Worried About Charity fundraiser in the Last Year: Not on file  . Ran Out of Food in the Last Year: Not on file  Transportation Needs:   . Lack of Transportation (Medical): Not on file  . Lack of Transportation (Non-Medical): Not on file  Physical Activity:   . Days of Exercise per Week: Not on file  . Minutes of Exercise per Session: Not on file  Stress:   . Feeling of Stress : Not on file  Social Connections:   . Frequency of Communication with Friends and Family: Not on file  . Frequency of Social Gatherings with Friends and Family: Not on file  . Attends Religious Services: Not on file  . Active Member of Clubs or Organizations: Not on file  . Attends Archivist Meetings: Not on file  . Marital Status: Not on file  Intimate Partner Violence:   . Fear of Current or Ex-Partner: Not on file  . Emotionally Abused: Not on file  . Physically Abused: Not on file  . Sexually Abused: Not on file      PHYSICAL EXAM  Vitals:   02/17/19 0803  BP: 125/75  Pulse: 76  Temp: (!) 97.3 F (36.3 C)  Weight: 190 lb 3.2 oz (86.3 kg)  Height: '5\' 3"'$  (1.6 m)   Body mass index is 33.69 kg/m.  Generalized: Well developed, in no acute distress  Cardiology: normal rate and rhythm, no murmur noted Respiratory: Clear to auscultation bilaterally  Neurological examination  Mentation: Alert oriented to time, place, history taking. Follows all commands speech and language fluent Cranial nerve II-XII: Pupils were equal round reactive to light. Extraocular movements were full, visual field were full on confrontational test. Facial sensation and strength were normal. Uvula tongue midline. Head turning and shoulder shrug  were normal and symmetric. Motor:  The motor testing reveals 5 over 5 strength of all 4 extremities. Good symmetric motor tone is noted throughout.  Sensory: Sensory testing is intact to soft touch on all 4 extremities. No evidence of extinction is noted.  Coordination: Cerebellar testing reveals good finger-nose-finger and heel-to-shin bilaterally.  Gait and station: Gait is normal.   DIAGNOSTIC DATA (LABS, IMAGING, TESTING) - I reviewed patient records, labs, notes, testing and imaging myself where available.  No flowsheet data found.   Lab Results  Component Value Date   WBC 9.9 01/11/2019   HGB 14.2 01/11/2019   HCT 42.0 01/11/2019   MCV 86 01/11/2019   PLT 310 01/11/2019      Component Value Date/Time   NA 140 01/11/2019 1449   NA 139 10/24/2012 1218   K 3.4 (L) 01/11/2019 1449   K 3.9 10/24/2012 1218   CL 100 01/11/2019 1449   CL 103 10/24/2012 1218   CO2 23 01/11/2019 1449   CO2 29 10/24/2012 1218   GLUCOSE 157 (H) 01/11/2019 1449   GLUCOSE 101 (H) 08/31/2018  4332   GLUCOSE 103 (H) 10/24/2012 1218   BUN 15 01/11/2019 1449   BUN 12 10/24/2012 1218   CREATININE 0.73 01/11/2019 1449   CREATININE 0.67 10/24/2012 1218   CALCIUM 9.7 01/11/2019 1449   CALCIUM 9.0 10/24/2012 1218   PROT 7.0 01/11/2019 1449   PROT 7.4 10/24/2012 1218   ALBUMIN 4.3 01/11/2019 1449   ALBUMIN 3.2 (L) 10/24/2012 1218   AST 28 01/11/2019 1449   AST 14 (L) 10/24/2012 1218   ALT 24 01/11/2019 1449   ALT 22 10/24/2012 1218   ALKPHOS 110 01/11/2019 1449   ALKPHOS 99 10/24/2012 1218   BILITOT <0.2 01/11/2019 1449   BILITOT 0.2 10/24/2012 1218   GFRNONAA 90 01/11/2019 1449   GFRNONAA >60 10/24/2012 1218   GFRAA 104 01/11/2019 1449   GFRAA >60 10/24/2012 1218   Lab Results  Component Value Date   CHOL 154 08/31/2018   HDL 67.30 08/31/2018   LDLCALC 52 08/31/2018   TRIG 173.0 (H) 08/31/2018   CHOLHDL 2 08/31/2018   Lab Results  Component Value Date   HGBA1C 6.6 (H) 01/11/2019   No results found for: VITAMINB12 Lab  Results  Component Value Date   TSH 3.14 08/31/2018       ASSESSMENT AND PLAN 59 y.o. year old female  has a past medical history of Allergy, Arthritis, Basal cell carcinoma, Basal cell carcinoma, Chicken pox, Complication of anesthesia, Depression, Diabetes mellitus without complication ([place]), Fatty liver, GERD (gastroesophageal reflux disease), Gout, Hematuria (10/15/2018), History of shingles, Hyperlipidemia, Hypertension, Insomnia, Migraine, Obesity, and Osteopenia. here with     ICD-10-CM   1. Chronic migraine without aura, with intractable migraine, so stated, with status migrainosus  G43.711 Galcanezumab-gnlm (EMGALITY) 120 MG/ML SOAJ    FLUoxetine (PROZAC) 40 MG capsule  2. Word finding difficulty  R47.89     Kristin [name] reports that headaches are significantly better over the past few months. She has had some concerns of intermittent episodes of brain fog and word finding difficulty. She denies stroke-like symptoms. We will continue Emgality monthly and Prozac daily for migraine prevention. She was recently started on amitriptyline by GI which is a great migraine preventative. Due to concerns of brain fog, we will stop topiramate and monitor migraines. She will continue Nurtec as needed for abortive therapy. She will work on adequate hydration, well balanced diet and regular exercise. We have reviewed MRI results in 2019 and discussed red flag warning for stroke. She will follow up regularly with PCP for management of co morbidities. She will return to see me in 6 months. If she continues to do well and migraines are not worsening, may follow up in 1 year. She verbalizes understanding and agreement with this plan.    No orders of the defined types were placed in this encounter.    Meds ordered this encounter  Medications  . Galcanezumab-gnlm (EMGALITY) 120 MG/ML SOAJ    Sig: Inject 120 mg into the skin every 30 (thirty) days. Appointment needed for further refills.    Dispense:  1 pen     Refill:  11    Order Specific Question:   Supervising Provider    Answer:   Melvenia Beam [id number]  . FLUoxetine (PROZAC) 40 MG capsule    Sig: Take 1 capsule (40 mg total) by mouth daily.    Dispense:  90 capsule    Refill:  3    Order Specific Question:   Supervising Provider    Answer:   Sarina Ill  B [[id number]]  . Rimegepant Sulfate (NURTEC) 75 MG TBDP    Sig: Take 75 mg by mouth daily as needed (take for abortive therapy of migraine, no more than 1 tablet in 24 hours or 10 per month).    Dispense:  10 tablet    Refill:  11    Order Specific Question:   Supervising Provider    Answer:   Melvenia Beam [id number]      I spent 20 minutes with the patient. 50% of this time was spent counseling and educating patient on plan of care and medications.     Debbora Presto, FNP-C 02/17/2019, 11:43 AM [facility] [address], [place], [place] [address] [phone]  Made any corrections needed, and agree with history, physical, neuro exam,assessment and plan as stated.     Sarina Ill, MD [facility]

## 2019-02-17 NOTE — Patient Instructions (Addendum)
We will continue Emgality monthly and Prozac daily. Continue amitriptyline prescribed by GI (this is a great migraine medication). We will discontinue topiramate.   Continue Nurtec for abortive therapy.   Stay well hydrated, healthy diet and regular exercise.  We will plan 6 month follow up, if doing well can follow up in 1 year    Migraine Headache A migraine headache is a very strong throbbing pain on one side or both sides of your head. This type of headache can also cause other symptoms. It can last from 4 hours to 3 days. Talk with your doctor about what things may bring on (trigger) this condition. What are the causes? The exact cause of this condition is not known. This condition may be triggered or caused by:  Drinking alcohol.  Smoking.  Taking medicines, such as: ? Medicine used to treat chest pain (nitroglycerin). ? Birth control pills. ? Estrogen. ? Some blood pressure medicines.  Eating or drinking certain products.  Doing physical activity. Other things that may trigger a migraine headache include:  Having a menstrual period.  Pregnancy.  Hunger.  Stress.  Not getting enough sleep or getting too much sleep.  Weather changes.  Tiredness (fatigue). What increases the risk?  Being 20-28 years old.  Being female.  Having a family history of migraine headaches.  Being Caucasian.  Having depression or anxiety.  Being very overweight. What are the signs or symptoms?  A throbbing pain. This pain may: ? Happen in any area of the head, such as on one side or both sides. ? Make it hard to do daily activities. ? Get worse with physical activity. ? Get worse around bright lights or loud noises.  Other symptoms may include: ? Feeling sick to your stomach (nauseous). ? Vomiting. ? Dizziness. ? Being sensitive to bright lights, loud noises, or smells.  Before you get a migraine headache, you may get warning signs (an aura). An aura may  include: ? Seeing flashing lights or having blind spots. ? Seeing bright spots, halos, or zigzag lines. ? Having tunnel vision or blurred vision. ? Having numbness or a tingling feeling. ? Having trouble talking. ? Having weak muscles.  Some people have symptoms after a migraine headache (postdromal phase), such as: ? Tiredness. ? Trouble thinking (concentrating). How is this treated?  Taking medicines that: ? Relieve pain. ? Relieve the feeling of being sick to your stomach. ? Prevent migraine headaches.  Treatment may also include: ? Having acupuncture. ? Avoiding foods that bring on migraine headaches. ? Learning ways to control your body functions (biofeedback). ? Therapy to help you know and deal with negative thoughts (cognitive behavioral therapy). Follow these instructions at home: Medicines  Take over-the-counter and prescription medicines only as told by your doctor.  Ask your doctor if the medicine prescribed to you: ? Requires you to avoid driving or using heavy machinery. ? Can cause trouble pooping (constipation). You may need to take these steps to prevent or treat trouble pooping:  Drink enough fluid to keep your pee (urine) pale yellow.  Take over-the-counter or prescription medicines.  Eat foods that are high in fiber. These include beans, whole grains, and fresh fruits and vegetables.  Limit foods that are high in fat and sugar. These include fried or sweet foods. Lifestyle  Do not drink alcohol.  Do not use any products that contain nicotine or tobacco, such as cigarettes, e-cigarettes, and chewing tobacco. If you need help quitting, ask your doctor.  Get at  least 8 hours of sleep every night.  Limit and deal with stress. General instructions      Keep a journal to find out what may bring on your migraine headaches. For example, write down: ? What you eat and drink. ? How much sleep you get. ? Any change in what you eat or drink. ? Any  change in your medicines.  If you have a migraine headache: ? Avoid things that make your symptoms worse, such as bright lights. ? It may help to lie down in a dark, quiet room. ? Do not drive or use heavy machinery. ? Ask your doctor what activities are safe for you.  Keep all follow-up visits as told by your doctor. This is important. Contact a doctor if:  You get a migraine headache that is different or worse than others you have had.  You have more than 15 headache days in one month. Get help right away if:  Your migraine headache gets very bad.  Your migraine headache lasts longer than 72 hours.  You have a fever.  You have a stiff neck.  You have trouble seeing.  Your muscles feel weak or like you cannot control them.  You start to lose your balance a lot.  You start to have trouble walking.  You pass out (faint).  You have a seizure. Summary  A migraine headache is a very strong throbbing pain on one side or both sides of your head. These headaches can also cause other symptoms.  This condition may be treated with medicines and changes to your lifestyle.  Keep a journal to find out what may bring on your migraine headaches.  Contact a doctor if you get a migraine headache that is different or worse than others you have had.  Contact your doctor if you have more than 15 headache days in a month. This information is not intended to replace advice given to you by your health care provider. Make sure you discuss any questions you have with your health care provider. Document Revised: 04/30/2018 Document Reviewed: 02/18/2018 Elsevier Patient Education  [place].   Memory Compensation Strategies  1. Use "WARM" strategy.  W= write it down  A= associate it  R= repeat it  M= make a mental note  2.   You can keep a Social worker.  Use a 3-ring notebook with sections for the following: calendar, important names and phone numbers,  medications,  doctors' names/phone numbers, lists/reminders, and a section to journal what you did  each day.   3.    Use a calendar to write appointments down.  4.    Write yourself a schedule for the day.  This can be placed on the calendar or in a separate section of the Memory Notebook.  Keeping a  regular schedule can help memory.  5.    Use medication organizer with sections for each day or morning/evening pills.  You may need help loading it  6.    Keep a basket, or pegboard by the door.  Place items that you need to take out with you in the basket or on the pegboard.  You may also want to  include a message board for reminders.  7.    Use sticky notes.  Place sticky notes with reminders in a place where the task is performed.  For example: " turn off the  stove" placed by the stove, "lock the door" placed on the door at eye level, "  take your medications" on  the bathroom mirror or by the place where you normally take your medications.  8.    Use alarms/timers.  Use while cooking to remind yourself to check on food or as a reminder to take your medicine, or as a  reminder to make a call, or as a reminder to perform another task, etc.

## 2019-03-01 ENCOUNTER — Ambulatory Visit (INDEPENDENT_AMBULATORY_CARE_PROVIDER_SITE_OTHER): Payer: No Typology Code available for payment source | Admitting: Gastroenterology

## 2019-03-01 ENCOUNTER — Other Ambulatory Visit: Payer: Self-pay

## 2019-03-01 ENCOUNTER — Encounter: Payer: Self-pay | Admitting: Gastroenterology

## 2019-03-01 VITALS — BP 122/70 | HR 80 | Temp 98.2°F | Wt 193.0 lb

## 2019-03-01 DIAGNOSIS — K219 Gastro-esophageal reflux disease without esophagitis: Secondary | ICD-10-CM | POA: Diagnosis not present

## 2019-03-01 DIAGNOSIS — K760 Fatty (change of) liver, not elsewhere classified: Secondary | ICD-10-CM

## 2019-03-01 DIAGNOSIS — E739 Lactose intolerance, unspecified: Secondary | ICD-10-CM

## 2019-03-01 DIAGNOSIS — K589 Irritable bowel syndrome without diarrhea: Secondary | ICD-10-CM | POA: Insufficient documentation

## 2019-03-01 DIAGNOSIS — K580 Irritable bowel syndrome with diarrhea: Secondary | ICD-10-CM | POA: Diagnosis not present

## 2019-03-01 MED ORDER — DICYCLOMINE HCL 10 MG PO CAPS: 10.0000 mg | ORAL_CAPSULE | Freq: Three times a day (TID) | ORAL | 0 refills | Status: DC | PRN

## 2019-03-01 MED ORDER — OMEPRAZOLE 40 MG PO CPDR: 40.0000 mg | DELAYED_RELEASE_CAPSULE | Freq: Every day | ORAL | 3 refills | Status: DC

## 2019-03-01 NOTE — Progress Notes (Signed)
Kristin Darby, MD [address]  [place]  [place], [place] [address]  Main: [phone]  Fax: [phone]    Gastroenterology Consultation  Referring Provider:     McLean-Scocuzza, Olivia Mackie * Primary Care Physician:  McLean-Scocuzza, Nino Glow, MD Primary Gastroenterologist:  Dr. Cephas [name] Reason for Consultation: Chronic diarrhea        HPI:   Sala Draney Tschida is a 59 y.o. female referred by Dr. Terese Door, Nino Glow, MD  for consultation & management of chronic diarrhea.  Patient reports that she started experiencing nonbloody watery bowel movements since beginning of October, primarily postprandial abdominal cramps in the left mid quadrant followed by a BM.  This has restricted her travel to [place] to take care of her mother.  She underwent work-up including stool studies to rule out infection and came back negative.  She stopped Celebrex thinking that it may have caused diarrhea but unchanged.  She denies any new medications 3 months.  She does have mild diabetes.  TSH is normal.  No evidence of anemia, normal CMP.  Also, limited intake of food due to postprandial urgency that has resulted in few pounds of weight loss.  She is currently taking Pepto-Bismol and Imodium as needed which is controlling her diarrhea. She also reports chronic intermittent heartburn for which she takes omeprazole 20 mg at bedtime. She denies nocturnal heartburn She does have chronic migraine and currently on multiple medications, followed by neurology  She used to work at [employer].  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  She started amitriptyline 25 mg, increasing to 50 mg at bedtime.  The bowel frequency is decreased from 4 times a day to 2 times a day and more formed.  She however has flareup of symptoms with certain dietary triggers including  cooked onions, tomatoes, butter, cheese which she tries to avoid.  These cause abdominal cramps and bowel urgency.  She also reports heartburn and currently taking omeprazole 20 mg once a day and she tends to forget to take twice daily.  Her heartburn is worse at night particularly when she has a late dinner.  NSAIDs: None  Antiplts/Anticoagulants/Anti thrombotics: None  GI Procedures: Had a colonoscopy at age 58, reportedly normal She denies family history of inflammatory bowel disease, celiac disease, GI malignancy  EGD and colonoscopy 01/31/2019  - Normal duodenal bulb and second portion of the duodenum. Biopsied. - Normal stomach. Biopsied. - Esophagogastric landmarks identified. - Normal gastroesophageal junction and esophagus.  - Preparation of the colon was fair. - Hemorrhoids found on perianal exam. - The examined portion of the ileum was normal. - The entire examined colon is normal. Biopsied. - The distal rectum and anal verge are normal on retroflexion view.  DIAGNOSIS:  A. DUODENUM; COLD BIOPSY:  - DUODENAL MUCOSA WITH NO SIGNIFICANT PATHOLOGIC ALTERATION.  - NEGATIVE FOR FEATURES OF CELIAC DISEASE.  - NEGATIVE FOR DYSPLASIA AND MALIGNANCY.   B. STOMACH; COLD BIOPSY:  - GASTRIC ANTRAL AND OXYNTIC MUCOSA WITH NO SIGNIFICANT PATHOLOGIC  ALTERATION.  - NEGATIVE FOR ACTIVE INFLAMMATION AND H PYLORI.  - NEGATIVE FOR INTESTINAL METAPLASIA, DYSPLASIA, AND MALIGNANCY.   C. COLON, RANDOM; COLD BIOPSY:  - COLONIC MUCOSA WITH NO SIGNIFICANT PATHOLOGIC ALTERATION.  - NEGATIVE FOR MICROSCOPIC COLITIS, DYSPLASIA, AND MALIGNANCY.   D. COLON POLYP, SIGMOID; COLD SNARE:  - TUBULAR ADENOMA.  - NEGATIVE FOR  HIGH-GRADE DYSPLASIA AND MALIGNANCY.   Past Medical History:  Diagnosis Date  . Allergy   . Arthritis   . Basal cell carcinoma    face, back, neck, arm  . Basal cell carcinoma    nose, left cheek  . Chicken pox   . Complication of anesthesia    blood pressure elevated  after gastric sleeve surgery in recovery, pt does not want mask over face  . Depression   . Diabetes mellitus without complication ([place])    pre-diabetic currently, was diabetic prior to gastric sleeve surgery   . Fatty liver   . GERD (gastroesophageal reflux disease)   . Gout   . Hematuria 10/15/2018  . History of shingles   . Hyperlipidemia   . Hypertension    no problems since losing weight after gastric sleeve surgery  . Insomnia   . Migraine   . Obesity   . Osteopenia     Past Surgical History:  Procedure Laterality Date  . ABDOMINAL HYSTERECTOMY     no h/o abnormal pap still has 1 ovary left  . ANKLE ARTHROSCOPY Left 05/29/2016   Procedure: ANKLE ARTHROSCOPY;  Surgeon: [name], DPM;  Location: [place];  Service: Podiatry;  Laterality: Left;  . APPENDECTOMY    . basal cell removed    . [place]  . CHOLECYSTECTOMY    . COLONOSCOPY WITH PROPOFOL N/A 01/31/2019   Procedure: COLONOSCOPY WITH PROPOFOL;  Surgeon: Lin Landsman, MD;  Location[facility];  Service: Gastroenterology;  Laterality: N/A;  . cyst removed from wrist    . ESOPHAGOGASTRODUODENOSCOPY (EGD) WITH PROPOFOL N/A 01/31/2019   Procedure: ESOPHAGOGASTRODUODENOSCOPY (EGD) WITH PROPOFOL;  Surgeon: Lin Landsman, MD;  Location: [place];  Service: Gastroenterology;  Laterality: N/A;  . FOOT SURGERY     left foot 11/2016 Dr. Laymond Purser   . left elbow surgery     07/2017 Dr Leanor Kail epicondylitis   . MOHS SURGERY     x 2 face nose and left cheek   . OSTEOCHONDROMA EXCISION Left 05/29/2016   Procedure: OSTEOCHONDRAL DRILLING TALUS;  Surgeon: [name], DPM;  Location: [place];  Service: Podiatry;  Laterality: Left;  . PLANTAR FASCIA RELEASE Left 05/29/2016   Procedure: ENDOSCOPIC PLANTAR FASCIOTOMY;  Surgeon: [name], DPM;  Location: [place];  Service: Podiatry;  Laterality: Left;  . ROTATOR CUFF REPAIR     lt shoulder  . STOMACH SURGERY  2017    gastric sleeve surgery  . TENNIS ELBOW RELEASE/NIRSCHEL PROCEDURE Left 08/05/2017   Procedure: TENNIS ELBOW RELEASE/NIRSCHEL PROCEDURE;  Surgeon: Leanor Kail, MD;  Location: [facility];  Service: Orthopedics;  Laterality: Left;  . TUBAL LIGATION    . wedge resection of ovary  1982    Current Outpatient Medications:  .  amitriptyline (ELAVIL) 25 MG tablet, TAKE 1 TABLET (25 MG TOTAL) BY MOUTH AT BEDTIME. INCREASE TO 50MG  AFTER 2 WEEKS IF NEEDED, Disp: 180 tablet, Rfl: 1 .  amLODipine (NORVASC) 5 MG tablet, Take 1 tablet (5 mg total) by mouth daily. And stop 2.5 mg daily (2 pills), Disp: 90 tablet, Rfl: 3 .  Cholecalciferol (VITAMIN D) 125 MCG (5000 UT) CAPS, , Disp: , Rfl:  .  estradiol (ESTRACE) 1 MG tablet, Take 1 tablet (1 mg total) by mouth daily., Disp: 90 tablet, Rfl: 3 .  fexofenadine (ALLEGRA) 180 MG tablet, Take 1 tablet (180 mg total) by mouth daily as needed for allergies or  rhinitis., Disp: 90 tablet, Rfl: 3 .  FLUoxetine (PROZAC) 40 MG capsule, Take 1 capsule (40 mg total) by mouth daily., Disp: 90 capsule, Rfl: 3 .  Galcanezumab-gnlm (EMGALITY) 120 MG/ML SOAJ, Inject 120 mg into the skin every 30 (thirty) days. Appointment needed for further refills., Disp: 1 pen, Rfl: 11 .  hydrochlorothiazide (HYDRODIURIL) 25 MG tablet, Take 1 tablet (25 mg total) by mouth daily. In am, Disp: 90 tablet, Rfl: 3 .  hydrOXYzine (ATARAX/VISTARIL) 10 MG tablet, Take 10 mg by mouth 3 (three) times daily., Disp: , Rfl:  .  ondansetron (ZOFRAN-ODT) 4 MG disintegrating tablet, TAKE 1 TABLET BY MOUTH EVERY 8 HOURS AS NEEDED FOR NAUSEA, Disp: 12 tablet, Rfl: 4 .  Rimegepant Sulfate (NURTEC) 75 MG TBDP, Take 75 mg by mouth daily as needed (take for abortive therapy of migraine, no more than 1 tablet in 24 hours or 10 per month)., Disp: 10 tablet, Rfl: 11 .  rosuvastatin (CRESTOR) 20 MG tablet, Take 1 tablet (20 mg total) by mouth at bedtime., Disp: 90 tablet, Rfl: 3 .  Turmeric 500 MG CAPS, , Disp: , Rfl:    .  zolpidem (AMBIEN CR) 12.5 MG CR tablet, Take 1 tablet (12.5 mg total) by mouth at bedtime as needed., Disp: 30 tablet, Rfl: 5 .  dicyclomine (BENTYL) 10 MG capsule, Take 1 capsule (10 mg total) by mouth 3 (three) times daily between meals as needed for spasms., Disp: 30 capsule, Rfl: 0 .  omeprazole (PRILOSEC) 40 MG capsule, Take 1 capsule (40 mg total) by mouth daily before supper., Disp: 30 capsule, Rfl: 3   Family History  Problem Relation Age of Onset  . Heart disease Mother   . Breast cancer Mother 37       metastatic, now hip, spine, and leg   . Arthritis Mother   . Hyperlipidemia Mother   . Cancer Mother        spinal surgery. breast dx'ed age 44 now metastatic 54 as of 07/13/18  . Cystic fibrosis Mother        carrier   . Heart disease Father   . Lung cancer Father   . Hyperlipidemia Father   . Hypertension Father   . Diabetes Sister   . Diabetes Maternal Aunt   . Diabetes Maternal Uncle   . Diabetes Maternal Grandfather   . Hyperlipidemia Maternal Grandfather   . Arthritis Maternal Grandmother   . Sudden death Cousin   . Diabetes Maternal Aunt   . Diabetes Sister   . Cystic fibrosis Other        great niece   . Colon cancer Neg Hx   . Ovarian cancer Neg Hx      Social History   Tobacco Use  . Smoking status: Never Smoker  . Smokeless tobacco: Never Used  Substance Use Topics  . Alcohol use: Never  . Drug use: Never    Allergies as of 03/01/2019 - Review Complete 03/01/2019  Allergen Reaction Noted  . Tetracyclines & related Swelling and Rash 06/04/2014  . Amoxicillin  12/10/2018  . Alcohol Rash 12/07/2014  . Azithromycin Rash 07/19/2018  . Metoprolol Diarrhea 12/07/2014    Review of Systems:    All systems reviewed and negative except where noted in HPI.   Physical Exam:  BP 122/70 (BP Location: Left Arm, Patient Position: Sitting, Cuff Size: Normal)   Pulse 80   Temp 98.2 F (36.8 C) (Oral)   Wt 193 lb (87.5 kg)   BMI 34.19 kg/m  No LMP  recorded. Patient has had a hysterectomy.  General:   Alert,  Well-developed, well-nourished, pleasant and cooperative in NAD Head:  Normocephalic and atraumatic. Eyes:  Sclera clear, no icterus.   Conjunctiva pink. Ears:  Normal auditory acuity. Nose:  No deformity, discharge, or lesions. Mouth:  No deformity or lesions,oropharynx pink & moist. Neck:  Supple; no masses or thyromegaly. Lungs:  Respirations even and unlabored.  Clear throughout to auscultation.   No wheezes, crackles, or rhonchi. No acute distress. Heart:  Regular rate and rhythm; no murmurs, clicks, rubs, or gallops. Abdomen:  Normal bowel sounds. Soft, non-tender and non-distended without masses, hepatosplenomegaly or hernias noted.  No guarding or rebound tenderness.   Rectal: Not performed Msk:  Symmetrical without gross deformities. Good, equal movement & strength bilaterally. Pulses:  Normal pulses noted. Extremities:  No clubbing or edema.  No cyanosis. Neurologic:  Alert and oriented x3;  grossly normal neurologically. Skin:  Intact without significant lesions or rashes. No jaundice. Psych:  Alert and cooperative. Normal mood and affect.  Imaging Studies: Reviewed  Assessment and Plan:   Paislee Marchionda Ciriello is a 59 y.o. 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

## 2019-03-03 ENCOUNTER — Ambulatory Visit: Payer: No Typology Code available for payment source | Admitting: Gastroenterology

## 2019-03-15 ENCOUNTER — Encounter: Payer: No Typology Code available for payment source | Admitting: Obstetrics and Gynecology

## 2019-03-31 ENCOUNTER — Telehealth: Payer: Self-pay | Admitting: Obstetrics and Gynecology

## 2019-03-31 NOTE — Telephone Encounter (Signed)
Called patient to see if she would like tor reschedule the appt she had to cancel in February. Wasn't able to reach patients LVM for her to return my phone call.

## 2019-04-23 ENCOUNTER — Encounter: Payer: Self-pay | Admitting: Internal Medicine

## 2019-04-28 ENCOUNTER — Telehealth: Payer: Self-pay | Admitting: Internal Medicine

## 2019-04-28 NOTE — Telephone Encounter (Signed)
Call to get pt scheduled for f/u appt asap   Thanks [place]

## 2019-04-29 ENCOUNTER — Other Ambulatory Visit: Payer: Self-pay | Admitting: Internal Medicine

## 2019-04-29 DIAGNOSIS — G4700 Insomnia, unspecified: Secondary | ICD-10-CM

## 2019-04-29 MED ORDER — ZOLPIDEM TARTRATE ER 12.5 MG PO TBCR: 12.5000 mg | EXTENDED_RELEASE_TABLET | Freq: Every evening | ORAL | 5 refills | Status: DC | PRN

## 2019-05-04 ENCOUNTER — Ambulatory Visit
Admission: RE | Admit: 2019-05-04 | Discharge: 2019-05-04 | Disposition: A | Discharge status: Home / Self Care | Payer: No Typology Code available for payment source | Source: Ambulatory Visit | Attending: Internal Medicine | Admitting: Internal Medicine

## 2019-05-04 ENCOUNTER — Telehealth (INDEPENDENT_AMBULATORY_CARE_PROVIDER_SITE_OTHER): Payer: No Typology Code available for payment source | Admitting: Internal Medicine

## 2019-05-04 ENCOUNTER — Ambulatory Visit
Admission: RE | Admit: 2019-05-04 | Discharge: 2019-05-04 | Disposition: A | Discharge status: Home / Self Care | Payer: No Typology Code available for payment source | Attending: Internal Medicine | Admitting: Internal Medicine

## 2019-05-04 ENCOUNTER — Encounter: Payer: Self-pay | Admitting: Internal Medicine

## 2019-05-04 ENCOUNTER — Other Ambulatory Visit: Payer: Self-pay

## 2019-05-04 VITALS — BP 120/70 | Ht 63.0 in | Wt 194.0 lb

## 2019-05-04 DIAGNOSIS — J9801 Acute bronchospasm: Secondary | ICD-10-CM | POA: Diagnosis not present

## 2019-05-04 DIAGNOSIS — E559 Vitamin D deficiency, unspecified: Secondary | ICD-10-CM

## 2019-05-04 DIAGNOSIS — R0602 Shortness of breath: Secondary | ICD-10-CM | POA: Insufficient documentation

## 2019-05-04 DIAGNOSIS — I10 Essential (primary) hypertension: Secondary | ICD-10-CM

## 2019-05-04 DIAGNOSIS — E119 Type 2 diabetes mellitus without complications: Secondary | ICD-10-CM | POA: Diagnosis not present

## 2019-05-04 DIAGNOSIS — Z1231 Encounter for screening mammogram for malignant neoplasm of breast: Secondary | ICD-10-CM

## 2019-05-04 MED ORDER — ALBUTEROL SULFATE HFA 108 (90 BASE) MCG/ACT IN AERS: 1.0000 | INHALATION_SPRAY | Freq: Four times a day (QID) | RESPIRATORY_TRACT | 11 refills | Status: DC | PRN

## 2019-05-04 NOTE — Progress Notes (Signed)
Virtual Visit via Video Note  I connected with Kristin [name]  on 05/04/19 at 10:20 AM EDT by a video enabled telemedicine application and verified that I am speaking with the correct person using two identifiers.  Location patient: home Location provider:work or home office Persons participating in the virtual visit: patient, provider  I discussed the limitations of evaluation and management by telemedicine and the availability of in person appointments. The patient expressed understanding and agreed to proceed.   HPI: 1. HTN controlled today on norvasc 5 mg qd, hctz 25 mg qd 2. Reason for visit sob with exertion I.e walking, bending x 3 weeks she does have allergies evergreen trees but 08/2018 allergy testing w/u negative overall. She does have GERD recently which is worsening on prilosec 40 mg qd. 2nd covid shot due today and after 1st had a fever. Never smoker father died of lung cancer   Echo 2014/09/03  INTERPRETATION NORMAL LEFT VENTRICULAR SYSTOLIC FUNCTION WITH AN ESTIMATED EF = 60 % NORMAL RIGHT VENTRICULAR SYSTOLIC FUNCTION MILD MITRAL VALVE INSUFFICIENCY TRACE TRICUSPID VALVE INSUFFICIENCY NO VALVULAR STENOSIS   ROS: See pertinent positives and negatives per HPI.  Past Medical History:  Diagnosis Date  . Allergy   . Arthritis   . Basal cell carcinoma    nose, left cheek  . Chicken pox   . Complication of anesthesia    blood pressure elevated after gastric sleeve surgery in recovery, pt does not want mask over face  . Depression   . Diabetes mellitus without complication ([place])    pre-diabetic currently, was diabetic prior to gastric sleeve surgery   . Fatty liver   . Gout   . Hematuria 10/15/2018  . History of shingles   . Hyperlipidemia   . Hypertension    no problems since losing weight after gastric sleeve surgery  . Insomnia   . Migraine   . Obesity   . Osteopenia     Past Surgical History:  Procedure Laterality Date  . ABDOMINAL HYSTERECTOMY     no h/o  abnormal pap still has 1 ovary left  . ANKLE ARTHROSCOPY Left 05/29/2016   Procedure: ANKLE ARTHROSCOPY;  Surgeon: [name], DPM;  Location: [place];  Service: Podiatry;  Laterality: Left;  . APPENDECTOMY    . basal cell removed    . [place]  . CHOLECYSTECTOMY    . COLONOSCOPY WITH PROPOFOL N/A 01/31/2019   Procedure: COLONOSCOPY WITH PROPOFOL;  Surgeon: Lin Landsman, MD;  Location: [facility]COPY;  Service: Gastroenterology;  Laterality: N/A;  . cyst removed from wrist    . ESOPHAGOGASTRODUODENOSCOPY (EGD) WITH PROPOFOL N/A 01/31/2019   Procedure: ESOPHAGOGASTRODUODENOSCOPY (EGD) WITH PROPOFOL;  Surgeon: Lin Landsman, MD;  Location: [place];  Service: Gastroenterology;  Laterality: N/A;  . FOOT SURGERY     left foot 11/2016 Dr. Laymond Purser   . left elbow surgery     07/2017 Dr Leanor Kail epicondylitis   . MOHS SURGERY     x 2 face nose and left cheek   . OSTEOCHONDROMA EXCISION Left 05/29/2016   Procedure: OSTEOCHONDRAL DRILLING TALUS;  Surgeon: [name], DPM;  Location: [place];  Service: Podiatry;  Laterality: Left;  . PLANTAR FASCIA RELEASE Left 05/29/2016   Procedure: ENDOSCOPIC PLANTAR FASCIOTOMY;  Surgeon: [name], DPM;  Location: [place];  Service: Podiatry;  Laterality: Left;  . ROTATOR CUFF REPAIR     lt shoulder  . STOMACH SURGERY  2017   gastric sleeve surgery  . TENNIS ELBOW RELEASE/NIRSCHEL PROCEDURE Left 08/05/2017   Procedure: TENNIS ELBOW RELEASE/NIRSCHEL PROCEDURE;  Surgeon: Leanor Kail, MD;  Location: [facility];  Service: Orthopedics;  Laterality: Left;  . TUBAL LIGATION    . wedge resection of ovary  1982    Family History  Problem Relation Age of Onset  . Heart disease Mother   . Breast cancer Mother 28       metastatic, now hip, spine, and leg   . Arthritis Mother   . Hyperlipidemia Mother   . Cancer Mother        spinal surgery. breast dx'ed age 106 now metastatic 61 as of 07/13/18  . Cystic  fibrosis Mother        carrier   . Heart disease Father   . Lung cancer Father        smoker  . Hyperlipidemia Father   . Hypertension Father   . Diabetes Sister   . Diabetes Maternal Aunt   . Diabetes Maternal Uncle   . Diabetes Maternal Grandfather   . Hyperlipidemia Maternal Grandfather   . Arthritis Maternal Grandmother   . Sudden death Cousin   . Diabetes Maternal Aunt   . Diabetes Sister   . Cystic fibrosis Other        great niece   . Colon cancer Neg Hx   . Ovarian cancer Neg Hx     SOCIAL HX: married with kids   Current Outpatient Medications:  .  amitriptyline (ELAVIL) 25 MG tablet, TAKE 1 TABLET (25 MG TOTAL) BY MOUTH AT BEDTIME. INCREASE TO 50MG AFTER 2 WEEKS IF NEEDED, Disp: 180 tablet, Rfl: 1 .  amLODipine (NORVASC) 5 MG tablet, Take 1 tablet (5 mg total) by mouth daily. And stop 2.5 mg daily (2 pills), Disp: 90 tablet, Rfl: 3 .  Cholecalciferol (VITAMIN D) 125 MCG (5000 UT) CAPS, , Disp: , Rfl:  .  estradiol (ESTRACE) 1 MG tablet, Take 1 tablet (1 mg total) by mouth daily., Disp: 90 tablet, Rfl: 3 .  fexofenadine (ALLEGRA) 180 MG tablet, Take 1 tablet (180 mg total) by mouth daily as needed for allergies or rhinitis., Disp: 90 tablet, Rfl: 3 .  FLUoxetine (PROZAC) 40 MG capsule, Take 1 capsule (40 mg total) by mouth daily., Disp: 90 capsule, Rfl: 3 .  hydrochlorothiazide (HYDRODIURIL) 25 MG tablet, Take 1 tablet (25 mg total) by mouth daily. In am, Disp: 90 tablet, Rfl: 3 .  hydrOXYzine (ATARAX/VISTARIL) 10 MG tablet, Take 10 mg by mouth 3 (three) times daily., Disp: , Rfl:  .  omeprazole (PRILOSEC) 40 MG capsule, Take 1 capsule (40 mg total) by mouth daily before supper., Disp: 30 capsule, Rfl: 3 .  Rimegepant Sulfate (NURTEC) 75 MG TBDP, Take 75 mg by mouth daily as needed (take for abortive therapy of migraine, no more than 1 tablet in 24 hours or 10 per month)., Disp: 10 tablet, Rfl: 11 .  rosuvastatin (CRESTOR) 20 MG tablet, Take 1 tablet (20 mg total) by mouth  at bedtime., Disp: 90 tablet, Rfl: 3 .  zolpidem (AMBIEN CR) 12.5 MG CR tablet, Take 1 tablet (12.5 mg total) by mouth at bedtime as needed., Disp: 30 tablet, Rfl: 5 .  albuterol (VENTOLIN HFA) 108 (90 Base) MCG/ACT inhaler, Inhale 1-2 puffs into the lungs every 6 (six) hours as needed for wheezing or shortness of breath., Disp: 18 g, Rfl: 11 .  dicyclomine (BENTYL) 10 MG capsule, Take 1 capsule (10 mg total)  by mouth 3 (three) times daily between meals as needed for spasms. (Patient not taking: Reported on 05/04/2019), Disp: 30 capsule, Rfl: 0 .  Galcanezumab-gnlm (EMGALITY) 120 MG/ML SOAJ, Inject 120 mg into the skin every 30 (thirty) days. Appointment needed for further refills. (Patient not taking: Reported on 05/04/2019), Disp: 1 pen, Rfl: 11 .  ondansetron (ZOFRAN-ODT) 4 MG disintegrating tablet, TAKE 1 TABLET BY MOUTH EVERY 8 HOURS AS NEEDED FOR NAUSEA (Patient not taking: Reported on 05/04/2019), Disp: 12 tablet, Rfl: 4 .  Turmeric 500 MG CAPS, , Disp: , Rfl:   EXAM:  VITALS per patient if applicable:  GENERAL: alert, oriented, appears well and in no acute distress  HEENT: atraumatic, conjunttiva clear, no obvious abnormalities on inspection of external nose and ears  NECK: normal movements of the head and neck  LUNGS: on inspection no signs of respiratory distress, breathing rate appears normal, no obvious gross SOB, gasping or wheezing  CV: no obvious cyanosis  MS: moves all visible extremities without noticeable abnormality  PSYCH/NEURO: pleasant and cooperative, no obvious depression or anxiety, speech and thought processing grossly intact  ASSESSMENT AND PLAN:  Discussed the following assessment and plan:  SOB (shortness of breath) on exertion r/o cardiac vs allergy vs lung etiology - Plan: DG Chest 2 View, albuterol (VENTOLIN HFA) 108 (90 Base) MCG/ACT inhaler, D-Dimer, Quantitative, Pro b natriuretic peptide (BNP) Consider repeat echo in future see HPI   Bronchospasm -  Plan: albuterol (VENTOLIN HFA) 108 (90 Base) MCG/ACT inhaler  Essential hypertension - Plan: Comprehensive metabolic panel, Lipid panel, CBC with Differential/Platelet Cont meds  Type 2 diabetes mellitus without complication, without long-term current use of insulin (HCC) - Plan: Hemoglobin A1c  Vitamin D deficiency - Plan: Vitamin D (25 hydroxy)  HM Flu shot utd utdTdap  Disc shingrixprev covid vx had 1/2 2nd dose 05/04/19  hep A vaccine1/2 2nd dose due 2nd dose, hep B immunewith h/o fatty liver Hep C neg MMRimmune A1C check 6.4   Never smoker, chew congratulatednot doing either mammoneg7/02/2018 normalordered Dr. Marcelline Mates  Colonoscopy -obtained records 07/15/11 colonoscopy Dr. Candace Cruise nl repeat in 10 years  Pap s/p hysterectomy ? Cervix intact has appt Dr. Enzo Bi 02/10/17.-Appt went well1/2019 ob/gynf/u in 1 year  -per notepap not neededshe will f/u with Dr. Marcelline Mates now  [place] [facility] [place] h/o BCC nose and left cheek s/p Mohs and h/o Aks  Of not she saw [facility] eye late March 2019 and had eye glasses checked  rheumatology Dr. Amil Amen pain in hands and feet celebrex started as of 01/11/19 pt stopped  NS-Dr. Cari Caraway Allergist in [place] and [place]    -we discussed possible serious and likely etiologies, options for evaluation and workup, limitations of telemedicine visit vs in person visit, treatment, treatment risks and precautions. Pt prefers to treat via telemedicine empirically rather then risking or undertaking an in person visit at this moment. Patient agrees to seek prompt in person care if worsening, new symptoms arise, or if is not improving with treatment.   I discussed the assessment and treatment plan with the patient. The patient was provided an opportunity to ask questions and all were answered. The patient agreed with the plan and demonstrated an understanding of the instructions.   The patient was advised  to call back or seek an in-person evaluation if the symptoms worsen or if the condition fails to improve as anticipated.  Time spent 25 minutes Delorise Jackson, MD

## 2019-05-06 LAB — CBC WITH DIFFERENTIAL/PLATELET
Basophils Absolute: 0.1 10*3/uL (ref 0.0–0.2)
Basos: 1 %
EOS (ABSOLUTE): 0.2 10*3/uL (ref 0.0–0.4)
Eos: 2 %
Hematocrit: 40 % (ref 34.0–46.6)
Hemoglobin: 13.2 g/dL (ref 11.1–15.9)
Immature Grans (Abs): 0 10*3/uL (ref 0.0–0.1)
Immature Granulocytes: 0 %
Lymphocytes Absolute: 2 10*3/uL (ref 0.7–3.1)
Lymphs: 20 %
MCH: 28.7 pg (ref 26.6–33.0)
MCHC: 33 g/dL (ref 31.5–35.7)
MCV: 87 fL (ref 79–97)
Monocytes Absolute: 0.4 10*3/uL (ref 0.1–0.9)
Monocytes: 4 %
Neutrophils Absolute: 7.2 10*3/uL — ABNORMAL HIGH (ref 1.4–7.0)
Neutrophils: 73 %
Platelets: 277 10*3/uL (ref 150–450)
RBC: 4.6 x10E6/uL (ref 3.77–5.28)
RDW: 13.6 % (ref 11.7–15.4)
WBC: 9.9 10*3/uL (ref 3.4–10.8)

## 2019-05-06 LAB — LIPID PANEL
Chol/HDL Ratio: 2.5 ratio (ref 0.0–4.4)
Cholesterol, Total: 147 mg/dL (ref 100–199)
HDL: 58 mg/dL (ref >39)
LDL Chol Calc (NIH): 62 mg/dL (ref 0–99)
Triglycerides: 163 mg/dL — ABNORMAL HIGH (ref 0–149)
VLDL Cholesterol Cal: 27 mg/dL (ref 5–40)

## 2019-05-06 LAB — HEMOGLOBIN A1C
Est. average glucose Bld gHb Est-mCnc: 160 mg/dL
Hgb A1c MFr Bld: 7.2 % — ABNORMAL HIGH (ref 4.8–5.6)

## 2019-05-06 LAB — COMPREHENSIVE METABOLIC PANEL
ALT: 29 IU/L (ref 0–32)
AST: 37 IU/L (ref 0–40)
Albumin/Globulin Ratio: 1.6 (ref 1.2–2.2)
Albumin: 4.3 g/dL (ref 3.8–4.9)
Alkaline Phosphatase: 97 IU/L (ref 39–117)
BUN/Creatinine Ratio: 16 (ref 9–23)
BUN: 11 mg/dL (ref 6–24)
Bilirubin Total: 0.3 mg/dL (ref 0.0–1.2)
CO2: 22 mmol/L (ref 20–29)
Calcium: 9.2 mg/dL (ref 8.7–10.2)
Chloride: 103 mmol/L (ref 96–106)
Creatinine, Ser: 0.69 mg/dL (ref 0.57–1.00)
GFR calc Af Amer: 110 mL/min/{1.73_m2} (ref >59)
GFR calc non Af Amer: 96 mL/min/{1.73_m2} (ref >59)
Globulin, Total: 2.7 g/dL (ref 1.5–4.5)
Glucose: 114 mg/dL — ABNORMAL HIGH (ref 65–99)
Potassium: 4.5 mmol/L (ref 3.5–5.2)
Sodium: 141 mmol/L (ref 134–144)
Total Protein: 7 g/dL (ref 6.0–8.5)

## 2019-05-06 LAB — VITAMIN D 25 HYDROXY (VIT D DEFICIENCY, FRACTURES): Vit D, 25-Hydroxy: 23.9 ng/mL — ABNORMAL LOW (ref 30.0–100.0)

## 2019-05-06 LAB — D-DIMER, QUANTITATIVE: D-DIMER: 0.28 mg/L FEU (ref 0.00–0.49)

## 2019-05-06 LAB — PRO B NATRIURETIC PEPTIDE: NT-Pro BNP: 54 pg/mL (ref 0–287)

## 2019-05-09 ENCOUNTER — Other Ambulatory Visit: Payer: Self-pay | Admitting: Internal Medicine

## 2019-05-09 DIAGNOSIS — E119 Type 2 diabetes mellitus without complications: Secondary | ICD-10-CM

## 2019-05-09 MED ORDER — METFORMIN HCL 500 MG PO TABS: 500.0000 mg | ORAL_TABLET | Freq: Two times a day (BID) | ORAL | 3 refills | Status: DC

## 2019-05-09 NOTE — Addendum Note (Signed)
Addended by: Orland Mustard on: 05/09/2019 08:16 PM   Modules accepted: Orders

## 2019-05-25 ENCOUNTER — Encounter: Payer: Self-pay | Admitting: Internal Medicine

## 2019-05-25 ENCOUNTER — Other Ambulatory Visit: Payer: Self-pay | Admitting: Gastroenterology

## 2019-05-25 LAB — HM DIABETES EYE EXAM

## 2019-05-25 NOTE — Telephone Encounter (Signed)
Last office visit 02/*09/2019 IBS chronic gerd  Last refill 03/01/2019 3 refills  Supposed to follow up in 3 months no appointment is made

## 2019-05-28 ENCOUNTER — Other Ambulatory Visit: Payer: Self-pay | Admitting: Gastroenterology

## 2019-06-07 ENCOUNTER — Ambulatory Visit (INDEPENDENT_AMBULATORY_CARE_PROVIDER_SITE_OTHER): Payer: No Typology Code available for payment source | Admitting: Dermatology

## 2019-06-07 ENCOUNTER — Encounter: Payer: Self-pay | Admitting: Dermatology

## 2019-06-07 ENCOUNTER — Other Ambulatory Visit: Payer: Self-pay

## 2019-06-07 DIAGNOSIS — L578 Other skin changes due to chronic exposure to nonionizing radiation: Secondary | ICD-10-CM

## 2019-06-07 DIAGNOSIS — L820 Inflamed seborrheic keratosis: Secondary | ICD-10-CM

## 2019-06-07 DIAGNOSIS — Z85828 Personal history of other malignant neoplasm of skin: Secondary | ICD-10-CM | POA: Diagnosis not present

## 2019-06-07 DIAGNOSIS — L821 Other seborrheic keratosis: Secondary | ICD-10-CM | POA: Diagnosis not present

## 2019-06-07 NOTE — Progress Notes (Signed)
   Follow-Up Visit   Subjective  Kristin [name] is a 59 y.o. female who presents for the following: Follow-up (for ISK on left medial canthus) and History of BCC (nose, left cheek, right upper lip, right posterior shoulder most recent 6 years ago on left cheek)   The following portions of the chart were reviewed this encounter and updated as appropriate:      Review of Systems:  No other skin or systemic complaints except as noted in HPI or Assessment and Plan.  Objective  Well appearing patient in no apparent distress; mood and affect are within normal limits.  A focused examination was performed including Face. Relevant physical exam findings are noted in the Assessment and Plan.  Objective  Left Lateral Canthus, Left Medial Canthus: Small erythematous waxy stuck-on papule    Assessment & Plan  Inflamed seborrheic keratosis (2) Left Medial Canthus; Left Lateral Canthus  Destruction of lesion - Left Lateral Canthus, Left Medial Canthus  Destruction method: cryotherapy   Informed consent: discussed and consent obtained   Lesion destroyed using liquid nitrogen: Yes   Region frozen until ice ball extended beyond lesion: Yes   Outcome: patient tolerated procedure well with no complications   Post-procedure details: wound care instructions given     Actinic Damage - diffuse scaly erythematous macules with underlying dyspigmentation - Recommend daily broad spectrum sunscreen SPF 30+ to sun-exposed areas, reapply every 2 hours as needed.  - Call for new or changing lesions.  Seborrheic Keratoses  - Stuck-on, waxy, tan-brown papules and plaques Face and Chest - Discussed benign etiology and prognosis. - Observe - Call for any changes  History of Basal Cell Carcinoma of the Skin - No evidence of recurrence today - Recommend regular full body skin exams - Recommend daily broad spectrum sunscreen SPF 30+ to sun-exposed areas, reapply every 2 hours as needed.  - Call if any  new or changing lesions are noted between office visits   Return in about 6 months (around 12/08/2019) for TBSE.   I, Donzetta Kohut, CMA, am acting as scribe for Brendolyn Patty, MD .  Documentation: I have reviewed the above documentation for accuracy and completeness, and I agree with the above.  Brendolyn Patty MD

## 2019-06-07 NOTE — Patient Instructions (Addendum)
Cryotherapy Aftercare  . Wash gently with soap and water everyday.   . Apply Vaseline and Band-Aid daily until healed. Recommend daily broad spectrum sunscreen SPF 30+ to sun-exposed areas, reapply every 2 hours as needed. Call for new or changing lesions.  

## 2019-06-17 ENCOUNTER — Other Ambulatory Visit: Payer: Self-pay | Admitting: Internal Medicine

## 2019-06-17 ENCOUNTER — Telehealth: Payer: Self-pay | Admitting: Internal Medicine

## 2019-06-17 DIAGNOSIS — G4700 Insomnia, unspecified: Secondary | ICD-10-CM

## 2019-06-17 MED ORDER — ZOLPIDEM TARTRATE ER 12.5 MG PO TBCR: 12.5000 mg | EXTENDED_RELEASE_TABLET | Freq: Every evening | ORAL | 0 refills | Status: DC | PRN

## 2019-06-17 NOTE — Telephone Encounter (Signed)
[facility] [place] [place] on [address] because they are traveling

## 2019-06-17 NOTE — Telephone Encounter (Signed)
Please nicely advise pt we did this this time but cant keep [place] meds to different states in the same [place] Please remember to take medications on vacation   [place]

## 2019-06-17 NOTE — Telephone Encounter (Signed)
[place] zolpidem (AMBIEN CR) 12.5 MG CR tablet and wanted To know if a 3 day supply could be called into [facility] [place]

## 2019-06-19 ENCOUNTER — Other Ambulatory Visit: Payer: Self-pay | Admitting: Gastroenterology

## 2019-06-21 NOTE — Telephone Encounter (Signed)
[id number] [name], [place] [address] [place], [place] [address]  Phone:  [phone].  Canceled  the script sent in there.  Mychart message sent informing the patient.

## 2019-06-23 ENCOUNTER — Telehealth: Payer: Self-pay | Admitting: Internal Medicine

## 2019-06-23 ENCOUNTER — Other Ambulatory Visit: Payer: Self-pay | Admitting: Internal Medicine

## 2019-06-23 ENCOUNTER — Institutional Professional Consult (permissible substitution): Payer: No Typology Code available for payment source | Admitting: Pulmonary Disease

## 2019-06-23 DIAGNOSIS — E785 Hyperlipidemia, unspecified: Secondary | ICD-10-CM

## 2019-06-23 MED ORDER — ROSUVASTATIN CALCIUM 20 MG PO TABS: 20.0000 mg | ORAL_TABLET | Freq: Every day | ORAL | 3 refills | Status: DC

## 2019-06-23 NOTE — Telephone Encounter (Signed)
Rejection Reason - Patient Declined - Pt cancelled appt and did not feel that she needed to be seen at our clinic so I closed the referral BL" [facility] said about 3 hours ago

## 2019-06-29 ENCOUNTER — Ambulatory Visit (INDEPENDENT_AMBULATORY_CARE_PROVIDER_SITE_OTHER): Payer: No Typology Code available for payment source | Admitting: Internal Medicine

## 2019-06-29 ENCOUNTER — Other Ambulatory Visit: Payer: Self-pay

## 2019-06-29 ENCOUNTER — Encounter: Payer: Self-pay | Admitting: Internal Medicine

## 2019-06-29 VITALS — BP 131/71 | HR 87 | Temp 97.4°F | Ht 63.0 in | Wt 190.0 lb

## 2019-06-29 DIAGNOSIS — N644 Mastodynia: Secondary | ICD-10-CM | POA: Diagnosis not present

## 2019-06-29 DIAGNOSIS — R2231 Localized swelling, mass and lump, right upper limb: Secondary | ICD-10-CM

## 2019-06-29 DIAGNOSIS — I10 Essential (primary) hypertension: Secondary | ICD-10-CM

## 2019-06-29 DIAGNOSIS — E1169 Type 2 diabetes mellitus with other specified complication: Secondary | ICD-10-CM

## 2019-06-29 DIAGNOSIS — E1159 Type 2 diabetes mellitus with other circulatory complications: Secondary | ICD-10-CM

## 2019-06-29 DIAGNOSIS — I152 Hypertension secondary to endocrine disorders: Secondary | ICD-10-CM

## 2019-06-29 DIAGNOSIS — E669 Obesity, unspecified: Secondary | ICD-10-CM

## 2019-06-29 NOTE — Progress Notes (Signed)
Chief Complaint  Patient presents with  . Cyst    possible cyst under the right arm pit. Has been there for some time but has grown and is becoming painful.    F/u on 06/29/19 1.c/o cyst under right arm noticed swelling a while ago w/o drainage and hurts with movement like reaching and getting larger and more painful h/o cysts. More so discomfort today and not much pain. Pain 2/10 with lifting  2. HTN norvasc 5 mg qd hctz 25 mg qd  3 . DM 2 A1C 05/05/19 7.2 on metformin 500 mg bid  Review of Systems  Constitutional: Negative for weight loss.  HENT: Negative for hearing loss.   Eyes: Negative for blurred vision.  Cardiovascular: Negative for chest pain.  Musculoskeletal: Negative for falls.  Skin: Negative for rash.  Neurological: Negative for headaches.  Endo/Heme/Allergies:       +right axillary pain   Psychiatric/Behavioral: Negative for memory loss.   Past Medical History:  Diagnosis Date  . Allergy   . Arthritis   . Basal cell carcinoma    nose, left cheek, right upper lip, right posterior shoulder most recent 6 years ago on left cheek  . Chicken pox   . Complication of anesthesia    blood pressure elevated after gastric sleeve surgery in recovery, pt does not want mask over face  . Depression   . Diabetes mellitus without complication ([place])    pre-diabetic currently, was diabetic prior to gastric sleeve surgery   . Fatty liver   . Gout   . Hematuria 10/15/2018  . History of shingles   . Hyperlipidemia   . Hypertension    no problems since losing weight after gastric sleeve surgery  . Insomnia   . Migraine   . Obesity   . Osteopenia    Past Surgical History:  Procedure Laterality Date  . ABDOMINAL HYSTERECTOMY     no h/o abnormal pap still has 1 ovary left  . ANKLE ARTHROSCOPY Left 05/29/2016   Procedure: ANKLE ARTHROSCOPY;  Surgeon: [name], DPM;  Location: [place];  Service: Podiatry;  Laterality: Left;  . APPENDECTOMY    . basal cell removed    . [place]  . CHOLECYSTECTOMY    . COLONOSCOPY WITH PROPOFOL N/A 01/31/2019   Procedure: COLONOSCOPY WITH PROPOFOL;  Surgeon: Lin Landsman, MD;  Location: [facility];  Service: Gastroenterology;  Laterality: N/A;  . cyst removed from wrist    . ESOPHAGOGASTRODUODENOSCOPY (EGD) WITH PROPOFOL N/A 01/31/2019   Procedure: ESOPHAGOGASTRODUODENOSCOPY (EGD) WITH PROPOFOL;  Surgeon: Lin Landsman, MD;  Location: [place];  Service: Gastroenterology;  Laterality: N/A;  . FOOT SURGERY     left foot 11/2016 Dr. Laymond Purser   . left elbow surgery     07/2017 Dr Leanor Kail epicondylitis   . MOHS SURGERY     x 2 face nose and left cheek   . OSTEOCHONDROMA EXCISION Left 05/29/2016   Procedure: OSTEOCHONDRAL DRILLING TALUS;  Surgeon: [name], DPM;  Location: [place];  Service: Podiatry;  Laterality: Left;  . PLANTAR FASCIA RELEASE Left 05/29/2016   Procedure: ENDOSCOPIC PLANTAR FASCIOTOMY;  Surgeon: [name], DPM;  Location: [place];  Service: Podiatry;  Laterality: Left;  . ROTATOR CUFF REPAIR     lt shoulder  . STOMACH SURGERY  2017   gastric sleeve surgery  . TENNIS ELBOW RELEASE/NIRSCHEL PROCEDURE Left 08/05/2017   Procedure: TENNIS ELBOW RELEASE/NIRSCHEL PROCEDURE;  Surgeon: Leanor Kail, MD;  Location: [facility];  Service: Orthopedics;  Laterality: Left;  . TUBAL LIGATION    . wedge resection of ovary  1982   Family History  Problem Relation Age of Onset  . Heart disease Mother   . Breast cancer Mother 31       metastatic, now hip, spine, and leg   . Arthritis Mother   . Hyperlipidemia Mother   . Cancer Mother        spinal surgery. breast dx'ed age 48 now metastatic 19 as of 07/13/18  . Cystic fibrosis Mother        carrier   . Heart disease Father   . Lung cancer Father        smoker  . Hyperlipidemia Father   . Hypertension Father   . Diabetes Sister   . Diabetes Maternal Aunt   . Diabetes Maternal Uncle   . Diabetes Maternal  Grandfather   . Hyperlipidemia Maternal Grandfather   . Arthritis Maternal Grandmother   . Sudden death Cousin   . Diabetes Maternal Aunt   . Diabetes Sister   . Cystic fibrosis Other        great niece   . Colon cancer Neg Hx   . Ovarian cancer Neg Hx    Social History   Socioeconomic History  . Marital status: Married    Spouse name: Not on file  . Number of children: 3  . Years of education: Not on file  . Highest education level: Some college, no degree  Occupational History  . Not on file  Tobacco Use  . Smoking status: Never Smoker  . Smokeless tobacco: Never Used  Vaping Use  . Vaping Use: Never used  Substance and Sexual Activity  . Alcohol use: Never  . Drug use: Never  . Sexual activity: Yes    Partners: Male    Birth control/protection: Surgical  Other Topics Concern  . Not on file  Social History Narrative   Married x 37 years as of 10/2017    Kids 3 boys youngest is Eddie Dibbles   Write childrens books    Retired from [employer] 01/2017    Enjoys traveling    Right handed   Caffeine: quit 2009   Social Determinants of Health   Financial Resource Strain:   . Difficulty of Paying Living Expenses:   Food Insecurity:   . Worried About Charity fundraiser in the Last Year:   . Arboriculturist in the Last Year:   Transportation Needs:   . Film/video editor (Medical):   Marland Kitchen Lack of Transportation (Non-Medical):   Physical Activity:   . Days of Exercise per Week:   . Minutes of Exercise per Session:   Stress:   . Feeling of Stress :   Social Connections:   . Frequency of Communication with Friends and Family:   . Frequency of Social Gatherings with Friends and Family:   . Attends Religious Services:   . Active Member of Clubs or Organizations:   . Attends Archivist Meetings:   Marland Kitchen Marital Status:   Intimate Partner Violence:   . Fear of Current or Ex-Partner:   . Emotionally Abused:   Marland Kitchen Physically Abused:   . Sexually Abused:    Current Meds   Medication Sig  . amLODipine (NORVASC) 5 MG tablet Take 1 tablet (5 mg total) by mouth daily. And stop 2.5 mg daily (2 pills)  . estradiol (ESTRACE) 1 MG  tablet Take 1 tablet (1 mg total) by mouth daily.  . fexofenadine (ALLEGRA) 180 MG tablet Take 1 tablet (180 mg total) by mouth daily as needed for allergies or rhinitis.  Marland Kitchen FLUoxetine (PROZAC) 40 MG capsule Take 1 capsule (40 mg total) by mouth daily.  . hydrochlorothiazide (HYDRODIURIL) 25 MG tablet Take 1 tablet (25 mg total) by mouth daily. In am  . metFORMIN (GLUCOPHAGE) 500 MG tablet Take 1 tablet (500 mg total) by mouth 2 (two) times daily with a meal.  . omeprazole (PRILOSEC) 40 MG capsule TAKE 1 CAPSULE (40 MG TOTAL) BY MOUTH DAILY BEFORE SUPPER.  . rosuvastatin (CRESTOR) 20 MG tablet Take 1 tablet (20 mg total) by mouth at bedtime.  . [DISCONTINUED] amitriptyline (ELAVIL) 25 MG tablet TAKE 1 TABLET (25 MG TOTAL) BY MOUTH AT BEDTIME. INCREASE TO 50MG AFTER 2 WEEKS IF NEEDED  . [DISCONTINUED] hydrOXYzine (ATARAX/VISTARIL) 10 MG tablet Take 10 mg by mouth 3 (three) times daily.  . [DISCONTINUED] zolpidem (AMBIEN CR) 12.5 MG CR tablet Take 1 tablet (12.5 mg total) by mouth at bedtime as needed.  . [DISCONTINUED] zolpidem (AMBIEN CR) 12.5 MG CR tablet Take 1 tablet (12.5 mg total) by mouth at bedtime as needed for sleep.   Allergies  Allergen Reactions  . Tetracyclines & Related Swelling and Rash    FACE SWELL, RASH ON CHEST  . Amoxicillin   . Alcohol Rash    ABDOMINAL CRAMPS, drinking alcohol  . Azithromycin Rash  . Metoprolol Diarrhea   Recent Results (from the past 2160 hour(s))  HM DIABETES EYE EXAM     Status: None   Collection Time: 05/25/19 12:00 AM  Result Value Ref Range   HM Diabetic Eye Exam No Retinopathy No Retinopathy    Comment: [facility] 05/25/19 [facility]   CBC with Differential/Platelet     Status: Abnormal   Collection Time: 07/20/19 12:42 PM  Result Value Ref Range   WBC 10.8 (H) 4.0 - 10.5 K/uL   RBC  4.74 3.87 - 5.11 Mil/uL   Hemoglobin 13.8 12.0 - 15.0 g/dL   HCT 40.3 36 - 46 %   MCV 85.1 78.0 - 100.0 fl   MCHC 34.1 30.0 - 36.0 g/dL   RDW 14.6 11.5 - 15.5 %   Platelets 294.0 150 - 400 K/uL   Neutrophils Relative % 65.7 43 - 77 %   Lymphocytes Relative 25.8 12 - 46 %   Monocytes Relative 5.9 3 - 12 %   Eosinophils Relative 1.4 0 - 5 %   Basophils Relative 1.2 0 - 3 %   Neutro Abs 7.1 1.4 - 7.7 K/uL   Lymphs Abs 2.8 0.7 - 4.0 K/uL   Monocytes Absolute 0.6 0 - 1 K/uL   Eosinophils Absolute 0.2 0 - 0 K/uL   Basophils Absolute 0.1 0 - 0 K/uL  PTT     Status: None   Collection Time: 07/20/19 12:42 PM  Result Value Ref Range   aPTT 29.1 23.4 - 32.7 SEC  Protime-INR ( [facility] ONLY)     Status: None   Collection Time: 07/20/19 12:42 PM  Result Value Ref Range   INR 0.9 0.8 - 1.0 ratio   Prothrombin Time 10.3 9.6 - 13.1 sec  Urinalysis, Routine w reflex microscopic     Status: None   Collection Time: 07/20/19 12:43 PM  Result Value Ref Range   Color, Urine YELLOW YELLOW   APPearance CLEAR CLEAR   Specific Gravity, Urine 1.011 1.001 - 1.03  pH 6.0 5.0 - 8.0   Glucose, UA NEGATIVE NEGATIVE   Bilirubin Urine NEGATIVE NEGATIVE   Ketones, ur NEGATIVE NEGATIVE   Hgb urine dipstick NEGATIVE NEGATIVE   Protein, ur NEGATIVE NEGATIVE   Nitrite NEGATIVE NEGATIVE   Leukocytes,Ua NEGATIVE NEGATIVE  Urine Culture     Status: None   Collection Time: 07/20/19 12:43 PM   Specimen: Urine  Result Value Ref Range   MICRO NUMBER: [id number]    SPECIMEN QUALITY: Adequate    Sample Source NOT GIVEN    STATUS: FINAL    Result: No Growth    Objective  Body mass index is 33.66 kg/m. Wt Readings from Last 3 Encounters:  06/29/19 190 lb (86.2 kg)  05/04/19 194 lb (88 kg)   Temp Readings from Last 3 Encounters:  06/29/19 (!) 97.4 F (36.3 C) (Temporal)  03/01/19 98.2 F (36.8 C) (Oral)   BP Readings from Last 3 Encounters:  06/29/19 (!) 131/71  05/04/19 120/70   Pulse Readings from  Last 3 Encounters:  06/29/19 87  03/01/19 80    Physical Exam Vitals and nursing note reviewed.  Constitutional:      Appearance: Normal appearance. She is well-developed and well-groomed. She is obese.  HENT:     Head: Normocephalic and atraumatic.  Eyes:     Conjunctiva/sclera: Conjunctivae normal.     Pupils: Pupils are equal, round, and reactive to light.  Cardiovascular:     Rate and Rhythm: Normal rate and regular rhythm.     Heart sounds: Normal heart sounds. No murmur heard.   Pulmonary:     Effort: Pulmonary effort is normal.     Breath sounds: Normal breath sounds.  Chest:    Lymphadenopathy:     Upper Body:     Right upper body: Axillary adenopathy present.  Skin:    General: Skin is warm and dry.  Neurological:     General: No focal deficit present.     Mental Status: She is alert and oriented to person, place, and time. Mental status is at baseline.     Gait: Gait normal.  Psychiatric:        Attention and Perception: Attention and perception normal.        Mood and Affect: Mood and affect normal.        Speech: Speech normal.        Behavior: Behavior normal. Behavior is cooperative.        Thought Content: Thought content normal.        Cognition and Memory: Cognition and memory normal.        Judgment: Judgment normal.     Assessment  Plan  Mass of right axilla - Plan: MM DIAG BREAST TOMO BILATERAL, US BREAST LTD UNI RIGHT INC AXILLA 07/05/19 dx mammo and [place] b/l negative +fat pad ttp due to Msk Could consider right shoulder Xray if not had   Essential hypertension BP variable Cont meds if BP still elevated consider twitch to maxzide from hctz 25 mg alone with norvasc 5 or add ARB  Obesity, diabetes, and hypertension syndrome ([place]) Hypertension associated with diabetes ([place]) Obesity (BMI 30-39.9)  Cont meds for now monitor BP goal <130/<80 See above re HTN med changes  A1 C 7.2 05/05/19   HM Flu shot utd utdTdap 01/08/13 Disc  shingrixprev still not had  covid vx had 2/2 moderna  hep A vaccine1/2 2nd dose due2nd dose hep B immunewith h/o fatty liver Hep C neg MMRimmune A1C check 6.4  Never smoker, chew congratulatednot doing either  mammo6/15/21 b/l diag normal right axillary mass fat pad likely MSK pain/ttp   Colonoscopy -obtained records 07/15/11 colonoscopy Dr. Candace Cruise nl repeat in 10 years  Colonoscopy 01/31/19 Dr. Marius Ditch [facility] prep fair   Pap s/p hysterectomy h.o fibroids ? Cervix intact has appt Dr. Enzo Bi 02/10/17. -Appt went well1/2019 ob/gynf/u in 1 year  -per notepap not neededshe will f/u with Dr. Marcelline Mates now last seen 03/10/2018  DEXA consider age 60   [facility] [place] h/o BCC nose and left cheek s/p Mohs and h/o Aks -seen 06/07/19 f/u 12/05/2019 Dr. Brendolyn Patty   05/25/19 [facility] neg retinopathy   rheumatology Dr. Amil Amen pain in hands and feetcelebrex started as of 01/11/19 pt stopped NS-Dr. Cari Caraway Allergist in [place] and [place]   Provider: Dr. Olivia Mackie [facility]

## 2019-06-29 NOTE — Patient Instructions (Addendum)
Vitamin D3 4000 to 5000 IU daily   We are ordering a diagnotic mammogram both breast and ultrasound of right under arm   Monitor Blood pressure if >130/>80 let me know  We will consider coreg 3.125 mg 2x per day    DASH Eating Plan DASH stands for "Dietary Approaches to Stop Hypertension." The DASH eating plan is a healthy eating plan that has been shown to reduce high blood pressure (hypertension). It may also reduce your risk for type 2 diabetes, heart disease, and stroke. The DASH eating plan may also help with weight loss. What are tips for following this plan?  General guidelines  Avoid eating more than 2,300 mg (milligrams) of salt (sodium) a day. If you have hypertension, you may need to reduce your sodium intake to 1,500 mg a day.  Limit alcohol intake to no more than 1 drink a day for nonpregnant women and 2 drinks a day for men. One drink equals 12 oz of beer, 5 oz of wine, or 1 oz of hard liquor.  Work with your health care provider to maintain a healthy body weight or to lose weight. Ask what an ideal weight is for you.  Get at least 30 minutes of exercise that causes your heart to beat faster (aerobic exercise) most days of the week. Activities may include walking, swimming, or biking.  Work with your health care provider or diet and nutrition specialist (dietitian) to adjust your eating plan to your individual calorie needs. Reading food labels   Check food labels for the amount of sodium per serving. Choose foods with less than 5 percent of the Daily Value of sodium. Generally, foods with less than 300 mg of sodium per serving fit into this eating plan.  To find whole grains, look for the word "whole" as the first word in the ingredient list. Shopping  Buy products labeled as "low-sodium" or "no salt added."  Buy fresh foods. Avoid canned foods and premade or frozen meals. Cooking  Avoid adding salt when cooking. Use salt-free seasonings or herbs instead of table  salt or sea salt. Check with your health care provider or pharmacist before using salt substitutes.  Do not fry foods. Cook foods using healthy methods such as baking, boiling, grilling, and broiling instead.  Cook with heart-healthy oils, such as olive, canola, soybean, or sunflower oil. Meal planning  Eat a balanced diet that includes: ? 5 or more servings of fruits and vegetables each day. At each meal, try to fill half of your plate with fruits and vegetables. ? Up to 6-8 servings of whole grains each day. ? Less than 6 oz of lean meat, poultry, or fish each day. A 3-oz serving of meat is about the same size as a deck of cards. One egg equals 1 oz. ? 2 servings of low-fat dairy each day. ? A serving of nuts, seeds, or beans 5 times each week. ? Heart-healthy fats. Healthy fats called Omega-3 fatty acids are found in foods such as flaxseeds and coldwater fish, like sardines, salmon, and mackerel.  Limit how much you eat of the following: ? Canned or prepackaged foods. ? Food that is high in trans fat, such as fried foods. ? Food that is high in saturated fat, such as fatty meat. ? Sweets, desserts, sugary drinks, and other foods with added sugar. ? Full-fat dairy products.  Do not salt foods before eating.  Try to eat at least 2 vegetarian meals each week.  Eat more home-cooked  food and less restaurant, buffet, and fast food.  When eating at a restaurant, ask that your food be prepared with less salt or no salt, if possible. What foods are recommended? The items listed may not be a complete list. Talk with your dietitian about what dietary choices are best for you. Grains Whole-grain or whole-wheat bread. Whole-grain or whole-wheat pasta. Brown rice. [place]. Bulgur. Whole-grain and low-sodium cereals. Pita bread. Low-fat, low-sodium crackers. Whole-wheat flour tortillas. Vegetables Fresh or frozen vegetables (raw, steamed, roasted, or grilled). Low-sodium or  reduced-sodium tomato and vegetable juice. Low-sodium or reduced-sodium tomato sauce and tomato paste. Low-sodium or reduced-sodium canned vegetables. Fruits All fresh, dried, or frozen fruit. Canned fruit in natural juice (without added sugar). Meat and other protein foods Skinless chicken or [place]. Ground chicken or [place]. Pork with fat trimmed off. Fish and seafood. Egg whites. Dried beans, peas, or lentils. Unsalted nuts, nut butters, and seeds. Unsalted canned beans. Lean cuts of beef with fat trimmed off. Low-sodium, lean deli meat. Dairy Low-fat (1%) or fat-free (skim) milk. Fat-free, low-fat, or reduced-fat cheeses. Nonfat, low-sodium ricotta or cottage cheese. Low-fat or nonfat yogurt. Low-fat, low-sodium cheese. Fats and oils Soft margarine without trans fats. Vegetable oil. Low-fat, reduced-fat, or light mayonnaise and salad dressings (reduced-sodium). Canola, safflower, olive, soybean, and sunflower oils. Avocado. Seasoning and other foods Herbs. Spices. Seasoning mixes without salt. Unsalted popcorn and pretzels. Fat-free sweets. What foods are not recommended? The items listed may not be a complete list. Talk with your dietitian about what dietary choices are best for you. Grains Baked goods made with fat, such as croissants, muffins, or some breads. Dry pasta or rice meal packs. Vegetables Creamed or fried vegetables. Vegetables in a cheese sauce. Regular canned vegetables (not low-sodium or reduced-sodium). Regular canned tomato sauce and paste (not low-sodium or reduced-sodium). Regular tomato and vegetable juice (not low-sodium or reduced-sodium). Angie Fava. Olives. Fruits Canned fruit in a light or heavy syrup. Fried fruit. Fruit in cream or butter sauce. Meat and other protein foods Fatty cuts of meat. Ribs. Fried meat. Berniece Salines. Sausage. Bologna and other processed lunch meats. Salami. Fatback. Hotdogs. Bratwurst. Salted nuts and seeds. Canned beans with added salt. Canned or  smoked fish. Whole eggs or egg yolks. Chicken or [place] with skin. Dairy Whole or 2% milk, cream, and half-and-half. Whole or full-fat cream cheese. Whole-fat or sweetened yogurt. Full-fat cheese. Nondairy creamers. Whipped toppings. Processed cheese and cheese spreads. Fats and oils Butter. Stick margarine. Lard. Shortening. Ghee. Bacon fat. Tropical oils, such as coconut, palm kernel, or palm oil. Seasoning and other foods Salted popcorn and pretzels. Onion salt, garlic salt, seasoned salt, table salt, and sea salt. Worcestershire sauce. Tartar sauce. Barbecue sauce. Teriyaki sauce. Soy sauce, including reduced-sodium. Steak sauce. Canned and packaged gravies. Fish sauce. Oyster sauce. Cocktail sauce. Horseradish that you find on the shelf. Ketchup. Mustard. Meat flavorings and tenderizers. Bouillon cubes. Hot sauce and Tabasco sauce. Premade or packaged marinades. Premade or packaged taco seasonings. Relishes. Regular salad dressings. Where to find more information:  National Heart, Lung, and [place]: https://wilson-eaton.com/  American Heart Association: www.heart.org Summary  The DASH eating plan is a healthy eating plan that has been shown to reduce high blood pressure (hypertension). It may also reduce your risk for type 2 diabetes, heart disease, and stroke.  With the DASH eating plan, you should limit salt (sodium) intake to 2,300 mg a day. If you have hypertension, you may need to reduce your sodium intake to 1,500 mg  a day.  When on the DASH eating plan, aim to eat more fresh fruits and vegetables, whole grains, lean proteins, low-fat dairy, and heart-healthy fats.  Work with your health care provider or diet and nutrition specialist (dietitian) to adjust your eating plan to your individual calorie needs. This information is not intended to replace advice given to you by your health care provider. Make sure you discuss any questions you have with your health care provider. Document  Revised: 12/19/2016 Document Reviewed: 12/31/2015 Elsevier Patient Education  2020 [employer].

## 2019-07-02 ENCOUNTER — Other Ambulatory Visit: Payer: Self-pay | Admitting: Gastroenterology

## 2019-07-05 ENCOUNTER — Ambulatory Visit
Admission: RE | Admit: 2019-07-05 | Discharge: 2019-07-05 | Disposition: A | Discharge status: Home / Self Care | Payer: No Typology Code available for payment source | Source: Ambulatory Visit | Attending: Internal Medicine | Admitting: Internal Medicine

## 2019-07-05 DIAGNOSIS — R2231 Localized swelling, mass and lump, right upper limb: Secondary | ICD-10-CM

## 2019-07-05 DIAGNOSIS — N644 Mastodynia: Secondary | ICD-10-CM

## 2019-07-07 ENCOUNTER — Encounter: Payer: Self-pay | Admitting: Internal Medicine

## 2019-07-19 ENCOUNTER — Other Ambulatory Visit: Payer: Self-pay | Admitting: Internal Medicine

## 2019-07-19 DIAGNOSIS — G4700 Insomnia, unspecified: Secondary | ICD-10-CM

## 2019-07-19 MED ORDER — ZOLPIDEM TARTRATE ER 12.5 MG PO TBCR: 12.5000 mg | EXTENDED_RELEASE_TABLET | Freq: Every evening | ORAL | 5 refills | Status: DC | PRN

## 2019-07-20 ENCOUNTER — Other Ambulatory Visit: Payer: Self-pay

## 2019-07-20 ENCOUNTER — Ambulatory Visit (INDEPENDENT_AMBULATORY_CARE_PROVIDER_SITE_OTHER): Payer: No Typology Code available for payment source | Admitting: Internal Medicine

## 2019-07-20 ENCOUNTER — Encounter: Payer: Self-pay | Admitting: Internal Medicine

## 2019-07-20 VITALS — BP 132/74 | HR 93 | Temp 98.4°F | Ht 63.0 in | Wt 188.4 lb

## 2019-07-20 DIAGNOSIS — R238 Other skin changes: Secondary | ICD-10-CM

## 2019-07-20 DIAGNOSIS — N3000 Acute cystitis without hematuria: Secondary | ICD-10-CM | POA: Diagnosis not present

## 2019-07-20 DIAGNOSIS — R233 Spontaneous ecchymoses: Secondary | ICD-10-CM

## 2019-07-20 LAB — PROTIME-INR
INR: 0.9 ratio (ref 0.8–1.0)
Prothrombin Time: 10.3 s (ref 9.6–13.1)

## 2019-07-20 LAB — CBC WITH DIFFERENTIAL/PLATELET
Basophils Absolute: 0.1 10*3/uL (ref 0.0–0.1)
Basophils Relative: 1.2 % (ref 0.0–3.0)
Eosinophils Absolute: 0.2 10*3/uL (ref 0.0–0.7)
Eosinophils Relative: 1.4 % (ref 0.0–5.0)
HCT: 40.3 % (ref 36.0–46.0)
Hemoglobin: 13.8 g/dL (ref 12.0–15.0)
Lymphocytes Relative: 25.8 % (ref 12.0–46.0)
Lymphs Abs: 2.8 10*3/uL (ref 0.7–4.0)
MCHC: 34.1 g/dL (ref 30.0–36.0)
MCV: 85.1 fl (ref 78.0–100.0)
Monocytes Absolute: 0.6 10*3/uL (ref 0.1–1.0)
Monocytes Relative: 5.9 % (ref 3.0–12.0)
Neutro Abs: 7.1 10*3/uL (ref 1.4–7.7)
Neutrophils Relative %: 65.7 % (ref 43.0–77.0)
Platelets: 294 10*3/uL (ref 150.0–400.0)
RBC: 4.74 Mil/uL (ref 3.87–5.11)
RDW: 14.6 % (ref 11.5–15.5)
WBC: 10.8 10*3/uL — ABNORMAL HIGH (ref 4.0–10.5)

## 2019-07-20 LAB — APTT: aPTT: 29.1 s (ref 23.4–32.7)

## 2019-07-20 NOTE — Patient Instructions (Signed)
Try to take vitamin C 500 mg daily  Try premier protein shake  Avoid Ibuprofen type medications  Contusion A contusion is a deep bruise. Contusions are the result of a blunt injury to tissues and muscle fibers under the skin. The injury causes bleeding under the skin. The skin overlying the contusion may turn blue, purple, or yellow. Minor injuries will give you a painless contusion, but more severe injuries cause contusions that may stay painful and swollen for a few weeks. Follow these instructions at home: Pay attention to any changes in your symptoms. Let your health care provider know about them. Take these actions to relieve your pain. Managing pain, stiffness, and swelling   Use resting, icing, applying pressure (compression), and raising (elevating) the injured area. This is often called the RICE strategy. ? Rest the injured area. Return to your normal activities as told by your health care provider. Ask your health care provider what activities are safe for you. ? If directed, put ice on the injured area:  Put ice in a plastic bag.  Place a towel between your skin and the bag.  Leave the ice on for 20 minutes, 2-3 times per day. ? If directed, apply light compression to the injured area using an elastic bandage. Make sure the bandage is not wrapped too tightly. Remove and reapply the bandage as directed by your health care provider. ? If possible, raise (elevate) the injured area above the level of your heart while you are sitting or lying down. General instructions  Take over-the-counter and prescription medicines only as told by your health care provider.  Keep all follow-up visits as told by your health care provider. This is important. Contact a health care provider if:  Your symptoms do not improve after several days of treatment.  Your symptoms get worse.  You have difficulty moving the injured area. Get help right away if:  You have severe pain.  You have  numbness in a hand or foot.  Your hand or foot turns pale or cold. Summary  A contusion is a deep bruise.  Contusions are the result of a blunt injury to tissues and muscle fibers under the skin.  It is treated with rest, ice, compression, and elevation. You may be given over-the-counter medicines for pain.  Contact a health care provider if your symptoms do not improve, or get worse.  Get help right away if you have severe pain, have numbness, or the area turns pale or cold. This information is not intended to replace advice given to you by your health care provider. Make sure you discuss any questions you have with your health care provider. Document Revised: 08/27/2017 Document Reviewed: 08/27/2017 Elsevier Patient Education  [place].

## 2019-07-20 NOTE — Progress Notes (Signed)
Patient presenting with frequent bruising. States she can sit with her legs crossed for too long and have a bruise.   This started about 3 weeks ago. States she has always bruised easily but this is worse. Patient's bruising increases over night and states she wakes up with them all over.   Her only new medication is Metformin. Wants to be sure this is not medication related.

## 2019-07-20 NOTE — Progress Notes (Signed)
Chief Complaint  Patient presents with   Medication Management   Bleeding/Bruising   F/u  1. C/o easy bruising from crossing legs at sleeping with one leg on the other if lying on side at night or 35 lb puppy jumping on her worse x 3 weeks and bruising on arms left and lower legs and abdomen, she started taking prenatal vitamins and D3 and wonders if could be related to any of her meds interacting. Metformin is new medication she denies taking nsaids or aspirin    Review of Systems  Constitutional:       4 lbs trying   HENT: Negative for hearing loss.   Eyes: Negative for blurred vision.  Respiratory: Negative for shortness of breath.   Cardiovascular: Negative for chest pain.  Endo/Heme/Allergies: Bruises/bleeds easily.   Past Medical History:  Diagnosis Date   Allergy    Arthritis    Basal cell carcinoma    nose, left cheek, right upper lip, right posterior shoulder most recent 6 years ago on left cheek   Chicken pox    Complication of anesthesia    blood pressure elevated after gastric sleeve surgery in recovery, pt does not want mask over face   Depression    Diabetes mellitus without complication ([place])    pre-diabetic currently, was diabetic prior to gastric sleeve surgery    Fatty liver    Gout    Hematuria 10/15/2018   History of shingles    Hyperlipidemia    Hypertension    no problems since losing weight after gastric sleeve surgery   Insomnia    Migraine    Obesity    Osteopenia    Past Surgical History:  Procedure Laterality Date   ABDOMINAL HYSTERECTOMY     no h/o abnormal pap still has 1 ovary left   ANKLE ARTHROSCOPY Left 05/29/2016   Procedure: ANKLE ARTHROSCOPY;  Surgeon: [name], DPM;  Location: [place];  Service: Podiatry;  Laterality: Left;   APPENDECTOMY     basal cell removed     [place] PROPOFOL N/A 01/31/2019   Procedure: COLONOSCOPY WITH  PROPOFOL;  Surgeon: Lin Landsman, MD;  Location[facility];  Service: Gastroenterology;  Laterality: N/A;   cyst removed from wrist     ESOPHAGOGASTRODUODENOSCOPY (EGD) WITH PROPOFOL N/A 01/31/2019   Procedure: ESOPHAGOGASTRODUODENOSCOPY (EGD) WITH PROPOFOL;  Surgeon: Lin Landsman, MD;  Location: [place];  Service: Gastroenterology;  Laterality: N/A;   FOOT SURGERY     left foot 11/2016 Dr. Laymond Purser    left elbow surgery     07/2017 Dr Leanor Kail epicondylitis    MOHS SURGERY     x 2 face nose and left cheek    OSTEOCHONDROMA EXCISION Left 05/29/2016   Procedure: OSTEOCHONDRAL DRILLING TALUS;  Surgeon: [name], DPM;  Location: [place];  Service: Podiatry;  Laterality: Left;   PLANTAR FASCIA RELEASE Left 05/29/2016   Procedure: ENDOSCOPIC PLANTAR FASCIOTOMY;  Surgeon: [name], DPM;  Location: [place];  Service: Podiatry;  Laterality: Left;   ROTATOR CUFF REPAIR     lt shoulder   STOMACH SURGERY  2017   gastric sleeve surgery   TENNIS ELBOW RELEASE/NIRSCHEL PROCEDURE Left 08/05/2017   Procedure: TENNIS ELBOW RELEASE/NIRSCHEL PROCEDURE;  Surgeon: Leanor Kail, MD;  Location: [facility];  Service: Orthopedics;  Laterality: Left;   TUBAL LIGATION     wedge resection  of ovary  1982   Family History  Problem Relation Age of Onset   Heart disease Mother    Breast cancer Mother 48       metastatic, now hip, spine, and leg    Arthritis Mother    Hyperlipidemia Mother    Cancer Mother        spinal surgery. breast dx'ed age 38 now metastatic 43 as of 07/13/18   Cystic fibrosis Mother        carrier    Heart disease Father    Lung cancer Father        smoker   Hyperlipidemia Father    Hypertension Father    Diabetes Sister    Diabetes Maternal Aunt    Diabetes Maternal Uncle    Diabetes Maternal Grandfather    Hyperlipidemia Maternal Grandfather    Arthritis Maternal Grandmother    Sudden death Cousin    Diabetes  Maternal Aunt    Diabetes Sister    Cystic fibrosis Other        great niece    Colon cancer Neg Hx    Ovarian cancer Neg Hx    Social History   Socioeconomic History   Marital status: Married    Spouse name: Not on file   Number of children: 3   Years of education: Not on file   Highest education level: Some college, no degree  Occupational History   Not on file  Tobacco Use   Smoking status: Never Smoker   Smokeless tobacco: Never Used  Vaping Use   Vaping Use: Never used  Substance and Sexual Activity   Alcohol use: Never   Drug use: Never   Sexual activity: Yes    Partners: Male    Birth control/protection: Surgical  Other Topics Concern   Not on file  Social History Narrative   Married x 37 years as of 10/2017    Kids 3 boys youngest is Eddie Dibbles   Write childrens books    Retired from [employer] 01/2017    Enjoys traveling    Right handed   Caffeine: quit 2009   Social Determinants of Health   Financial Resource Strain:    Difficulty of Paying Living Expenses:   Food Insecurity:    Worried About Charity fundraiser in the Last Year:    Arboriculturist in the Last Year:   Transportation Needs:    Film/video editor (Medical):    Lack of Transportation (Non-Medical):   Physical Activity:    Days of Exercise per Week:    Minutes of Exercise per Session:   Stress:    Feeling of Stress :   Social Connections:    Frequency of Communication with Friends and Family:    Frequency of Social Gatherings with Friends and Family:    Attends Religious Services:    Active Member of Clubs or Organizations:    Attends Music therapist:    Marital Status:   Intimate Partner Violence:    Fear of Current or Ex-Partner:    Emotionally Abused:    Physically Abused:    Sexually Abused:    Current Meds  Medication Sig   albuterol (VENTOLIN HFA) 108 (90 Base) MCG/ACT inhaler Inhale 1-2 puffs into the lungs every 6 (six) hours  as needed for wheezing or shortness of breath.   amitriptyline (ELAVIL) 25 MG tablet TAKE 1 TABLET (25 MG TOTAL) BY MOUTH AT BEDTIME. INCREASE TO 50MG  AFTER 2 WEEKS IF NEEDED  amLODipine (NORVASC) 5 MG tablet Take 1 tablet (5 mg total) by mouth daily. And stop 2.5 mg daily (2 pills)   Cholecalciferol (DIALYVITE VITAMIN D 5000 PO) Take 5,000 Units by mouth every other day.   dicyclomine (BENTYL) 10 MG capsule TAKE 1 CAPSULE (10 MG TOTAL) BY MOUTH 3 (THREE) TIMES DAILY BETWEEN MEALS AS NEEDED FOR SPASMS.   estradiol (ESTRACE) 1 MG tablet Take 1 tablet (1 mg total) by mouth daily.   fexofenadine (ALLEGRA) 180 MG tablet Take 1 tablet (180 mg total) by mouth daily as needed for allergies or rhinitis.   FLUoxetine (PROZAC) 40 MG capsule Take 1 capsule (40 mg total) by mouth daily.   hydrochlorothiazide (HYDRODIURIL) 25 MG tablet Take 1 tablet (25 mg total) by mouth daily. In am   metFORMIN (GLUCOPHAGE) 500 MG tablet Take 1 tablet (500 mg total) by mouth 2 (two) times daily with a meal.   omeprazole (PRILOSEC) 40 MG capsule TAKE 1 CAPSULE (40 MG TOTAL) BY MOUTH DAILY BEFORE SUPPER.   rosuvastatin (CRESTOR) 20 MG tablet Take 1 tablet (20 mg total) by mouth at bedtime.   zolpidem (AMBIEN CR) 12.5 MG CR tablet Take 1 tablet (12.5 mg total) by mouth at bedtime as needed.   Allergies  Allergen Reactions   Tetracyclines & Related Swelling and Rash    FACE SWELL, RASH ON CHEST   Amoxicillin    Alcohol Rash    ABDOMINAL CRAMPS, drinking alcohol   Azithromycin Rash   Metoprolol Diarrhea   Recent Results (from the past 2160 hour(s))  Comprehensive metabolic panel     Status: Abnormal   Collection Time: 05/05/19  8:38 AM  Result Value Ref Range   Glucose 114 (H) 65 - 99 mg/dL   BUN 11 6 - 24 mg/dL   Creatinine, Ser 0.69 0.57 - 1.00 mg/dL   GFR calc non Af Amer 96 >59 mL/min/1.73   GFR calc Af Amer 110 >59 mL/min/1.73   BUN/Creatinine Ratio 16 9 - 23   Sodium 141 134 - 144 mmol/L    Potassium 4.5 3.5 - 5.2 mmol/L   Chloride 103 96 - 106 mmol/L   CO2 22 20 - 29 mmol/L   Calcium 9.2 8.7 - 10.2 mg/dL   Total Protein 7.0 6.0 - 8.5 g/dL   Albumin 4.3 3.8 - 4.9 g/dL   Globulin, Total 2.7 1.5 - 4.5 g/dL   Albumin/Globulin Ratio 1.6 1.2 - 2.2   Bilirubin Total 0.3 0.0 - 1.2 mg/dL   Alkaline Phosphatase 97 39 - 117 IU/L   AST 37 0 - 40 IU/L   ALT 29 0 - 32 IU/L  Lipid panel     Status: Abnormal   Collection Time: 05/05/19  8:38 AM  Result Value Ref Range   Cholesterol, Total 147 100 - 199 mg/dL   Triglycerides 163 (H) 0 - 149 mg/dL   HDL 58 >39 mg/dL   VLDL Cholesterol Cal 27 5 - 40 mg/dL   LDL Chol Calc (NIH) 62 0 - 99 mg/dL   Chol/HDL Ratio 2.5 0.0 - 4.4 ratio    Comment:                                   T. Chol/HDL Ratio  Men  Women                               1/2 Avg.Risk  3.4    3.3                                   Avg.Risk  5.0    4.4                                2X Avg.Risk  9.6    7.1                                3X Avg.Risk 23.4   11.0   CBC with Differential/Platelet     Status: Abnormal   Collection Time: 05/05/19  8:38 AM  Result Value Ref Range   WBC 9.9 3.4 - 10.8 x10E3/uL   RBC 4.60 3.77 - 5.28 x10E6/uL   Hemoglobin 13.2 11.1 - 15.9 g/dL   Hematocrit 40.0 34.0 - 46.6 %   MCV 87 79 - 97 fL   MCH 28.7 26.6 - 33.0 pg   MCHC 33.0 31 - 35 g/dL   RDW 13.6 11.7 - 15.4 %   Platelets 277 150 - 450 x10E3/uL   Neutrophils 73 Not Estab. %   Lymphs 20 Not Estab. %   Monocytes 4 Not Estab. %   Eos 2 Not Estab. %   Basos 1 Not Estab. %   Neutrophils Absolute 7.2 (H) 1 - 7 x10E3/uL   Lymphocytes Absolute 2.0 0 - 3 x10E3/uL   Monocytes Absolute 0.4 0 - 0 x10E3/uL   EOS (ABSOLUTE) 0.2 0.0 - 0.4 x10E3/uL   Basophils Absolute 0.1 0 - 0 x10E3/uL   Immature Granulocytes 0 Not Estab. %   Immature Grans (Abs) 0.0 0.0 - 0.1 x10E3/uL  Hemoglobin A1c     Status: Abnormal   Collection Time: 05/05/19  8:38 AM   Result Value Ref Range   Hgb A1c MFr Bld 7.2 (H) 4.8 - 5.6 %    Comment:          Prediabetes: 5.7 - 6.4          Diabetes: >6.4          Glycemic control for adults with diabetes: <7.0    Est. average glucose Bld gHb Est-mCnc 160 mg/dL  Vitamin D (25 hydroxy)     Status: Abnormal   Collection Time: 05/05/19  8:38 AM  Result Value Ref Range   Vit D, 25-Hydroxy 23.9 (L) 30.0 - 100.0 ng/mL    Comment: Vitamin D deficiency has been defined by the Institute of Medicine and an Endocrine Society practice guideline as a level of serum 25-OH vitamin D less than 20 ng/mL (1,2). The Endocrine Society went on to further define vitamin D insufficiency as a level between 21 and 29 ng/mL (2). 1. IOM (Institute of Medicine). 2010. Dietary reference    intakes for calcium and D. [place]: The    [employer]. 2. Holick MF, Binkley [place], Bischoff-Ferrari HA, et al.    Evaluation, treatment, and prevention of vitamin D    deficiency: an Endocrine Society clinical practice    guideline. JCEM. 2011 Jul; 96(7):1911-30.   D-Dimer, Quantitative  Status: None   Collection Time: 05/05/19  8:38 AM  Result Value Ref Range   D-DIMER 0.28 0.00 - 0.49 mg/L FEU    Comment: According to the assay manufacturer's published package insert, a normal (<0.50 mg/L FEU) D-dimer result in conjunction with a non-high clinical probability assessment, excludes deep vein thrombosis (DVT) and pulmonary embolism (PE) with high sensitivity. D-dimer values increase with age and this can make VTE exclusion of an older population difficult. To address this, the [place], based on best available evidence and recent guidelines, recommends that clinicians use age-adjusted D-dimer thresholds in patients greater than 67 years of age with: a) a low probability of PE who do not meet all Pulmonary Embolism Rule Out Criteria, or b) in those with intermediate probability of PE. The formula for  an age-adjusted D-dimer cut-off is "age/100". For example, a 60 year old patient would have an age-adjusted cut-off of 0.60 mg/L FEU and an 60 year old 0.80 mg/L FEU.   Pro b natriuretic peptide (BNP)     Status: None   Collection Time: 05/05/19  8:38 AM  Result Value Ref Range   NT-Pro BNP 54 0 - 287 pg/mL    Comment: The following cut-points have been suggested for the use of proBNP for the diagnostic evaluation of heart failure (HF) in patients with acute dyspnea: Modality                     Age           Optimal Cut                            (years)            Point ------------------------------------------------------ Diagnosis (rule in HF)        <50            450 pg/mL                           50 - 75            900 pg/mL                               >75           1800 pg/mL Exclusion (rule out HF)  Age independent     300 pg/mL   HM DIABETES EYE EXAM     Status: None   Collection Time: 05/25/19 12:00 AM  Result Value Ref Range   HM Diabetic Eye Exam No Retinopathy No Retinopathy    Comment: [facility] 05/25/19 [facility]    Objective  Body mass index is 33.37 kg/m. Wt Readings from Last 3 Encounters:  07/20/19 188 lb 6.4 oz (85.5 kg)  06/29/19 190 lb (86.2 kg)  05/04/19 194 lb (88 kg)   Temp Readings from Last 3 Encounters:  07/20/19 98.4 F (36.9 C) (Oral)  06/29/19 (!) 97.4 F (36.3 C) (Temporal)  03/01/19 98.2 F (36.8 C) (Oral)   BP Readings from Last 3 Encounters:  07/20/19 132/74  06/29/19 140/80  05/04/19 120/70   Pulse Readings from Last 3 Encounters:  07/20/19 93  06/29/19 87  03/01/19 80    Physical Exam Vitals and nursing note reviewed.  Constitutional:      Appearance: Normal appearance. She is well-developed and well-groomed.  She is obese.  HENT:     Head: Normocephalic and atraumatic.  Eyes:     Conjunctiva/sclera: Conjunctivae normal.     Pupils: Pupils are equal, round, and reactive to light.  Cardiovascular:     Rate and  Rhythm: Normal rate and regular rhythm.     Heart sounds: Normal heart sounds. No murmur heard.   Pulmonary:     Effort: Pulmonary effort is normal.     Breath sounds: Normal breath sounds.  Skin:    General: Skin is warm and moist.     Findings: Bruising present.     Comments: Lower leg bruising    Neurological:     General: No focal deficit present.     Mental Status: She is alert and oriented to person, place, and time. Mental status is at baseline.     Gait: Gait normal.  Psychiatric:        Attention and Perception: Attention and perception normal.        Mood and Affect: Mood and affect normal.        Speech: Speech normal.        Behavior: Behavior normal. Behavior is cooperative.        Thought Content: Thought content normal.        Cognition and Memory: Cognition and memory normal.        Judgment: Judgment normal.     Assessment  Plan  Easy bruising - Plan: CBC with Differential/Platelet, PTT, Protime-INR ( [facility] ONLY) rec avoid nsaids  Vitamin C 500 mg qd  Premier protein shake  Acute cystitis without hematuria - Plan: Urinalysis, Routine w reflex microscopic, Urine Culture Rule out uti with lower back pain  Also review MRI 2011 with multilevel Deg changes low back  Provider: Dr. Olivia Mackie McLean-Scocuzza-Internal Medicine

## 2019-07-21 LAB — URINE CULTURE
MICRO NUMBER:: 10652827
Result:: NO GROWTH
SPECIMEN QUALITY:: ADEQUATE

## 2019-07-21 LAB — URINALYSIS, ROUTINE W REFLEX MICROSCOPIC
Bilirubin Urine: NEGATIVE
Glucose, UA: NEGATIVE
Hgb urine dipstick: NEGATIVE
Ketones, ur: NEGATIVE
Leukocytes,Ua: NEGATIVE
Nitrite: NEGATIVE
Protein, ur: NEGATIVE
Specific Gravity, Urine: 1.011 (ref 1.001–1.03)
pH: 6 (ref 5.0–8.0)

## 2019-08-04 ENCOUNTER — Ambulatory Visit: Payer: No Typology Code available for payment source | Admitting: Internal Medicine

## 2019-08-08 ENCOUNTER — Other Ambulatory Visit: Payer: Self-pay | Admitting: Gastroenterology

## 2019-08-08 DIAGNOSIS — K580 Irritable bowel syndrome with diarrhea: Secondary | ICD-10-CM

## 2019-08-17 ENCOUNTER — Ambulatory Visit: Payer: No Typology Code available for payment source | Admitting: Family Medicine

## 2019-08-18 DIAGNOSIS — E669 Obesity, unspecified: Secondary | ICD-10-CM

## 2019-08-18 DIAGNOSIS — I152 Hypertension secondary to endocrine disorders: Secondary | ICD-10-CM | POA: Insufficient documentation

## 2019-08-18 DIAGNOSIS — E1159 Type 2 diabetes mellitus with other circulatory complications: Secondary | ICD-10-CM | POA: Insufficient documentation

## 2019-08-18 DIAGNOSIS — E1169 Type 2 diabetes mellitus with other specified complication: Secondary | ICD-10-CM

## 2019-08-18 DIAGNOSIS — E119 Type 2 diabetes mellitus without complications: Secondary | ICD-10-CM | POA: Insufficient documentation

## 2019-08-18 HISTORY — DX: Obesity, unspecified: E66.9

## 2019-08-18 HISTORY — DX: Type 2 diabetes mellitus with other circulatory complications: E11.69

## 2019-08-19 ENCOUNTER — Other Ambulatory Visit: Payer: Self-pay | Admitting: Internal Medicine

## 2019-08-19 ENCOUNTER — Other Ambulatory Visit (HOSPITAL_COMMUNITY)
Admission: RE | Admit: 2019-08-19 | Discharge: 2019-08-19 | Disposition: A | Discharge status: Home / Self Care | Payer: No Typology Code available for payment source | Source: Ambulatory Visit | Attending: Internal Medicine | Admitting: Internal Medicine

## 2019-08-19 ENCOUNTER — Other Ambulatory Visit: Payer: Self-pay

## 2019-08-19 ENCOUNTER — Ambulatory Visit (INDEPENDENT_AMBULATORY_CARE_PROVIDER_SITE_OTHER): Payer: No Typology Code available for payment source | Admitting: Internal Medicine

## 2019-08-19 ENCOUNTER — Encounter: Payer: Self-pay | Admitting: Internal Medicine

## 2019-08-19 VITALS — BP 136/80 | HR 84 | Temp 97.7°F | Ht 63.0 in | Wt 186.6 lb

## 2019-08-19 DIAGNOSIS — F419 Anxiety disorder, unspecified: Secondary | ICD-10-CM

## 2019-08-19 DIAGNOSIS — Z0000 Encounter for general adult medical examination without abnormal findings: Secondary | ICD-10-CM

## 2019-08-19 DIAGNOSIS — R946 Abnormal results of thyroid function studies: Secondary | ICD-10-CM

## 2019-08-19 DIAGNOSIS — F32A Depression, unspecified: Secondary | ICD-10-CM

## 2019-08-19 DIAGNOSIS — B373 Candidiasis of vulva and vagina: Secondary | ICD-10-CM

## 2019-08-19 DIAGNOSIS — E669 Obesity, unspecified: Secondary | ICD-10-CM | POA: Diagnosis not present

## 2019-08-19 DIAGNOSIS — E1159 Type 2 diabetes mellitus with other circulatory complications: Secondary | ICD-10-CM

## 2019-08-19 DIAGNOSIS — M25511 Pain in right shoulder: Secondary | ICD-10-CM | POA: Diagnosis not present

## 2019-08-19 DIAGNOSIS — Z124 Encounter for screening for malignant neoplasm of cervix: Secondary | ICD-10-CM

## 2019-08-19 DIAGNOSIS — E1169 Type 2 diabetes mellitus with other specified complication: Secondary | ICD-10-CM

## 2019-08-19 DIAGNOSIS — E785 Hyperlipidemia, unspecified: Secondary | ICD-10-CM | POA: Diagnosis not present

## 2019-08-19 DIAGNOSIS — I10 Essential (primary) hypertension: Secondary | ICD-10-CM | POA: Diagnosis not present

## 2019-08-19 DIAGNOSIS — Z1329 Encounter for screening for other suspected endocrine disorder: Secondary | ICD-10-CM

## 2019-08-19 DIAGNOSIS — I152 Hypertension secondary to endocrine disorders: Secondary | ICD-10-CM

## 2019-08-19 DIAGNOSIS — G43711 Chronic migraine without aura, intractable, with status migrainosus: Secondary | ICD-10-CM

## 2019-08-19 DIAGNOSIS — B3731 Acute candidiasis of vulva and vagina: Secondary | ICD-10-CM

## 2019-08-19 DIAGNOSIS — F329 Major depressive disorder, single episode, unspecified: Secondary | ICD-10-CM

## 2019-08-19 HISTORY — DX: Encounter for general adult medical examination without abnormal findings: Z00.00

## 2019-08-19 LAB — COMPREHENSIVE METABOLIC PANEL
ALT: 20 U/L (ref 0–35)
AST: 23 U/L (ref 0–37)
Albumin: 3.9 g/dL (ref 3.5–5.2)
Alkaline Phosphatase: 71 U/L (ref 39–117)
BUN: 14 mg/dL (ref 6–23)
CO2: 22 mEq/L (ref 19–32)
Calcium: 8.6 mg/dL (ref 8.4–10.5)
Chloride: 106 mEq/L (ref 96–112)
Creatinine, Ser: 0.66 mg/dL (ref 0.40–1.20)
GFR: 91.44 mL/min (ref >60.00)
Glucose, Bld: 114 mg/dL — ABNORMAL HIGH (ref 70–99)
Potassium: 3.5 mEq/L (ref 3.5–5.1)
Sodium: 138 mEq/L (ref 135–145)
Total Bilirubin: 0.3 mg/dL (ref 0.2–1.2)
Total Protein: 7 g/dL (ref 6.0–8.3)

## 2019-08-19 LAB — CBC WITH DIFFERENTIAL/PLATELET
Basophils Absolute: 0.1 10*3/uL (ref 0.0–0.1)
Basophils Relative: 1 % (ref 0.0–3.0)
Eosinophils Absolute: 0.1 10*3/uL (ref 0.0–0.7)
Eosinophils Relative: 1.5 % (ref 0.0–5.0)
HCT: 40.1 % (ref 36.0–46.0)
Hemoglobin: 13.2 g/dL (ref 12.0–15.0)
Lymphocytes Relative: 34.6 % (ref 12.0–46.0)
Lymphs Abs: 3 10*3/uL (ref 0.7–4.0)
MCHC: 32.8 g/dL (ref 30.0–36.0)
MCV: 86.6 fl (ref 78.0–100.0)
Monocytes Absolute: 0.5 10*3/uL (ref 0.1–1.0)
Monocytes Relative: 5.4 % (ref 3.0–12.0)
Neutro Abs: 5 10*3/uL (ref 1.4–7.7)
Neutrophils Relative %: 57.5 % (ref 43.0–77.0)
Platelets: 277 10*3/uL (ref 150.0–400.0)
RBC: 4.62 Mil/uL (ref 3.87–5.11)
RDW: 15.2 % (ref 11.5–15.5)
WBC: 8.8 10*3/uL (ref 4.0–10.5)

## 2019-08-19 LAB — LIPID PANEL
Cholesterol: 142 mg/dL (ref 0–200)
HDL: 61.4 mg/dL (ref >39.00)
LDL Cholesterol: 42 mg/dL (ref 0–99)
NonHDL: 80.71
Total CHOL/HDL Ratio: 2
Triglycerides: 196 mg/dL — ABNORMAL HIGH (ref 0.0–149.0)
VLDL: 39.2 mg/dL (ref 0.0–40.0)

## 2019-08-19 LAB — HEMOGLOBIN A1C: Hgb A1c MFr Bld: 7.2 % — ABNORMAL HIGH (ref 4.6–6.5)

## 2019-08-19 LAB — TSH: TSH: 5.5 u[IU]/mL — ABNORMAL HIGH (ref 0.35–4.50)

## 2019-08-19 MED ORDER — FLUCONAZOLE 150 MG PO TABS: 150.0000 mg | ORAL_TABLET | Freq: Once | ORAL | 0 refills | Status: AC

## 2019-08-19 MED ORDER — FLUOXETINE HCL 20 MG PO TABS: 20.0000 mg | ORAL_TABLET | Freq: Every day | ORAL | 0 refills | Status: DC

## 2019-08-19 NOTE — Progress Notes (Signed)
Chief Complaint  Patient presents with  . Follow-up  . Gynecologic Exam   Annual  1. HTN did not take BP meds today sl elevated on norvasc 5 and hctz 25 mg qd  2. Reviewed 6/15/ mammo and [place] MSK pain c/o mild right shoulder pain with ROM at times declines Xray for now  3. Anxiety/depression mood is better wants to taper off prozac 40  4. DM 2 on metformin 500 mg check A1C today   Review of Systems  Constitutional: Negative for weight loss.  HENT: Negative for hearing loss.   Eyes: Negative for blurred vision.  Respiratory: Negative for shortness of breath.   Cardiovascular: Negative for chest pain.  Gastrointestinal: Negative for abdominal pain.  Musculoskeletal: Positive for joint pain. Negative for falls.  Skin: Negative for rash.  Neurological: Negative for headaches.  Endo/Heme/Allergies: Bruises/bleeds easily.  Psychiatric/Behavioral: Negative for depression. The patient is not nervous/anxious.    Past Medical History:  Diagnosis Date  . Allergy   . Arthritis   . Basal cell carcinoma    nose, left cheek, right upper lip, right posterior shoulder most recent 6 years ago on left cheek  . Chicken pox   . Complication of anesthesia    blood pressure elevated after gastric sleeve surgery in recovery, pt does not want mask over face  . Depression   . Diabetes mellitus without complication ([place])    pre-diabetic currently, was diabetic prior to gastric sleeve surgery   . Fatty liver   . Gout   . Hematuria 10/15/2018  . History of shingles   . Hyperlipidemia   . Hypertension    no problems since losing weight after gastric sleeve surgery  . Insomnia   . Migraine   . Obesity   . Osteopenia    Past Surgical History:  Procedure Laterality Date  . ABDOMINAL HYSTERECTOMY     no h/o abnormal pap still has 1 ovary left  . ANKLE ARTHROSCOPY Left 05/29/2016   Procedure: ANKLE ARTHROSCOPY;  Surgeon: [name], DPM;  Location: [place];  Service: Podiatry;  Laterality: Left;  .  APPENDECTOMY    . basal cell removed    . [place]  . CHOLECYSTECTOMY    . COLONOSCOPY WITH PROPOFOL N/A 01/31/2019   Procedure: COLONOSCOPY WITH PROPOFOL;  Surgeon: Lin Landsman, MD;  [facility] ENDOSCOPY;  Service: Gastroenterology;  Laterality: N/A;  . cyst removed from wrist    . ESOPHAGOGASTRODUODENOSCOPY (EGD) WITH PROPOFOL N/A 01/31/2019   Procedure: ESOPHAGOGASTRODUODENOSCOPY (EGD) WITH PROPOFOL;  Surgeon: Lin Landsman, MD;  Location: [place];  Service: Gastroenterology;  Laterality: N/A;  . FOOT SURGERY     left foot 11/2016 Dr. Laymond Purser   . left elbow surgery     07/2017 Dr Leanor Kail epicondylitis   . MOHS SURGERY     x 2 face nose and left cheek   . OSTEOCHONDROMA EXCISION Left 05/29/2016   Procedure: OSTEOCHONDRAL DRILLING TALUS;  Surgeon: [name], DPM;  Location: [place];  Service: Podiatry;  Laterality: Left;  . PLANTAR FASCIA RELEASE Left 05/29/2016   Procedure: ENDOSCOPIC PLANTAR FASCIOTOMY;  Surgeon: [name], DPM;  Location: [place];  Service: Podiatry;  Laterality: Left;  . ROTATOR CUFF REPAIR     lt shoulder  . STOMACH SURGERY  2017   gastric sleeve surgery  . TENNIS ELBOW RELEASE/NIRSCHEL PROCEDURE Left 08/05/2017   Procedure: TENNIS ELBOW RELEASE/NIRSCHEL PROCEDURE;  Surgeon: Leanor Kail,  MD;  Location: [facility];  Service: Orthopedics;  Laterality: Left;  . TUBAL LIGATION    . wedge resection of ovary  1982   Family History  Problem Relation Age of Onset  . Heart disease Mother   . Breast cancer Mother 72       metastatic, now hip, spine, and leg   . Arthritis Mother   . Hyperlipidemia Mother   . Cancer Mother        spinal surgery. breast dx'ed age 29 now metastatic 3 as of 07/13/18  . Cystic fibrosis Mother        carrier   . Heart disease Father   . Lung cancer Father        smoker  . Hyperlipidemia Father   . Hypertension Father   . Diabetes Sister   . Diabetes Maternal Aunt    . Diabetes Maternal Uncle   . Diabetes Maternal Grandfather   . Hyperlipidemia Maternal Grandfather   . Arthritis Maternal Grandmother   . Sudden death Cousin   . Diabetes Maternal Aunt   . Diabetes Sister   . Cystic fibrosis Other        great niece   . Colon cancer Neg Hx   . Ovarian cancer Neg Hx    Social History   Socioeconomic History  . Marital status: Married    Spouse name: Not on file  . Number of children: 3  . Years of education: Not on file  . Highest education level: Some college, no degree  Occupational History  . Not on file  Tobacco Use  . Smoking status: Never Smoker  . Smokeless tobacco: Never Used  Vaping Use  . Vaping Use: Never used  Substance and Sexual Activity  . Alcohol use: Never  . Drug use: Never  . Sexual activity: Yes    Partners: Male    Birth control/protection: Surgical  Other Topics Concern  . Not on file  Social History Narrative   Married x 37 years as of 10/2017    Kids 3 boys youngest is Eddie Dibbles   Write childrens books    Retired from [employer] 01/2017    Enjoys traveling    Right handed   Caffeine: quit 2009   Social Determinants of Health   Financial Resource Strain:   . Difficulty of Paying Living Expenses:   Food Insecurity:   . Worried About Charity fundraiser in the Last Year:   . Arboriculturist in the Last Year:   Transportation Needs:   . Film/video editor (Medical):   Marland Kitchen Lack of Transportation (Non-Medical):   Physical Activity:   . Days of Exercise per Week:   . Minutes of Exercise per Session:   Stress:   . Feeling of Stress :   Social Connections:   . Frequency of Communication with Friends and Family:   . Frequency of Social Gatherings with Friends and Family:   . Attends Religious Services:   . Active Member of Clubs or Organizations:   . Attends Archivist Meetings:   Marland Kitchen Marital Status:   Intimate Partner Violence:   . Fear of Current or Ex-Partner:   . Emotionally Abused:   Marland Kitchen  Physically Abused:   . Sexually Abused:    Current Meds  Medication Sig  . albuterol (VENTOLIN HFA) 108 (90 Base) MCG/ACT inhaler Inhale 1-2 puffs into the lungs every 6 (six) hours as needed for wheezing or shortness of breath.  Marland Kitchen amitriptyline (ELAVIL)  25 MG tablet TAKE 1 TABLET (25 MG TOTAL) BY MOUTH AT BEDTIME. INCREASE TO 50MG AFTER 2 WEEKS IF NEEDED  . amLODipine (NORVASC) 5 MG tablet Take 1 tablet (5 mg total) by mouth daily. And stop 2.5 mg daily (2 pills)  . Cholecalciferol (DIALYVITE VITAMIN D 5000 PO) Take 5,000 Units by mouth every other day.  . dicyclomine (BENTYL) 10 MG capsule TAKE 1 CAPSULE (10 MG TOTAL) BY MOUTH 3 (THREE) TIMES DAILY BETWEEN MEALS AS NEEDED FOR SPASMS.  Marland Kitchen estradiol (ESTRACE) 1 MG tablet Take 1 tablet (1 mg total) by mouth daily.  . fexofenadine (ALLEGRA) 180 MG tablet Take 1 tablet (180 mg total) by mouth daily as needed for allergies or rhinitis.  . hydrochlorothiazide (HYDRODIURIL) 25 MG tablet Take 1 tablet (25 mg total) by mouth daily. In am  . metFORMIN (GLUCOPHAGE) 500 MG tablet Take 1 tablet (500 mg total) by mouth 2 (two) times daily with a meal.  . omeprazole (PRILOSEC) 40 MG capsule TAKE 1 CAPSULE (40 MG TOTAL) BY MOUTH DAILY BEFORE SUPPER.  Marland Kitchen ondansetron (ZOFRAN-ODT) 4 MG disintegrating tablet TAKE 1 TABLET BY MOUTH EVERY 8 HOURS AS NEEDED FOR NAUSEA  . Rimegepant Sulfate (NURTEC) 75 MG TBDP Take 75 mg by mouth daily as needed (take for abortive therapy of migraine, no more than 1 tablet in 24 hours or 10 per month).  . rosuvastatin (CRESTOR) 20 MG tablet Take 1 tablet (20 mg total) by mouth at bedtime.  Marland Kitchen zolpidem (AMBIEN CR) 12.5 MG CR tablet Take 1 tablet (12.5 mg total) by mouth at bedtime as needed.  . [DISCONTINUED] FLUoxetine (PROZAC) 40 MG capsule Take 1 capsule (40 mg total) by mouth daily.   Allergies  Allergen Reactions  . Tetracyclines & Related Swelling and Rash    FACE SWELL, RASH ON CHEST  . Amoxicillin   . Alcohol Rash     ABDOMINAL CRAMPS, drinking alcohol  . Azithromycin Rash  . Metoprolol Diarrhea   Recent Results (from the past 2160 hour(s))  HM DIABETES EYE EXAM     Status: None   Collection Time: 05/25/19 12:00 AM  Result Value Ref Range   HM Diabetic Eye Exam No Retinopathy No Retinopathy    Comment: [facility] 05/25/19 [facility]   CBC with Differential/Platelet     Status: Abnormal   Collection Time: 07/20/19 12:42 PM  Result Value Ref Range   WBC 10.8 (H) 4.0 - 10.5 K/uL   RBC 4.74 3.87 - 5.11 Mil/uL   Hemoglobin 13.8 12.0 - 15.0 g/dL   HCT 40.3 36 - 46 %   MCV 85.1 78.0 - 100.0 fl   MCHC 34.1 30.0 - 36.0 g/dL   RDW 14.6 11.5 - 15.5 %   Platelets 294.0 150 - 400 K/uL   Neutrophils Relative % 65.7 43 - 77 %   Lymphocytes Relative 25.8 12 - 46 %   Monocytes Relative 5.9 3 - 12 %   Eosinophils Relative 1.4 0 - 5 %   Basophils Relative 1.2 0 - 3 %   Neutro Abs 7.1 1.4 - 7.7 K/uL   Lymphs Abs 2.8 0.7 - 4.0 K/uL   Monocytes Absolute 0.6 0 - 1 K/uL   Eosinophils Absolute 0.2 0 - 0 K/uL   Basophils Absolute 0.1 0 - 0 K/uL  PTT     Status: None   Collection Time: 07/20/19 12:42 PM  Result Value Ref Range   aPTT 29.1 23.4 - 32.7 SEC  Protime-INR ( [facility] ONLY)  Status: None   Collection Time: 07/20/19 12:42 PM  Result Value Ref Range   INR 0.9 0.8 - 1.0 ratio   Prothrombin Time 10.3 9.6 - 13.1 sec  Urinalysis, Routine w reflex microscopic     Status: None   Collection Time: 07/20/19 12:43 PM  Result Value Ref Range   Color, Urine YELLOW YELLOW   APPearance CLEAR CLEAR   Specific Gravity, Urine 1.011 1.001 - 1.03   pH 6.0 5.0 - 8.0   Glucose, UA NEGATIVE NEGATIVE   Bilirubin Urine NEGATIVE NEGATIVE   Ketones, ur NEGATIVE NEGATIVE   Hgb urine dipstick NEGATIVE NEGATIVE   Protein, ur NEGATIVE NEGATIVE   Nitrite NEGATIVE NEGATIVE   Leukocytes,Ua NEGATIVE NEGATIVE  Urine Culture     Status: None   Collection Time: 07/20/19 12:43 PM   Specimen: Urine  Result Value Ref Range    MICRO NUMBER: [id number]    SPECIMEN QUALITY: Adequate    Sample Source NOT GIVEN    STATUS: FINAL    Result: No Growth    Objective  Body mass index is 33.05 kg/m. Wt Readings from Last 3 Encounters:  08/19/19 186 lb 9.6 oz (84.6 kg)  07/20/19 188 lb 6.4 oz (85.5 kg)  06/29/19 190 lb (86.2 kg)   Temp Readings from Last 3 Encounters:  08/19/19 97.7 F (36.5 C) (Oral)  07/20/19 98.4 F (36.9 C) (Oral)  06/29/19 (!) 97.4 F (36.3 C) (Temporal)   BP Readings from Last 3 Encounters:  08/19/19 (!) 136/80  07/20/19 132/74  06/29/19 (!) 131/71   Pulse Readings from Last 3 Encounters:  08/19/19 84  07/20/19 93  06/29/19 87    Physical Exam Vitals and nursing note reviewed. Exam conducted with a chaperone present.  Constitutional:      Appearance: Normal appearance. She is well-developed and well-groomed. She is obese.  HENT:     Head: Normocephalic and atraumatic.  Eyes:     Conjunctiva/sclera: Conjunctivae normal.     Pupils: Pupils are equal, round, and reactive to light.  Cardiovascular:     Rate and Rhythm: Normal rate and regular rhythm.     Heart sounds: Normal heart sounds. No murmur heard.   Pulmonary:     Effort: Pulmonary effort is normal.     Breath sounds: Normal breath sounds.  Chest:     Chest wall: No mass.     Breasts: Breasts are symmetrical.        Right: Normal.        Left: Normal.  Abdominal:     Tenderness: There is no abdominal tenderness.  Genitourinary:    Pubic Area: No rash.      Labia:        Right: No rash.        Left: No rash.      Vagina: Normal.     Cervix: Discharge present.     Uterus: Absent.      Adnexa: Right adnexa normal and left adnexa normal.     Comments: Absent cervix Lymphadenopathy:     Upper Body:     Right upper body: No axillary adenopathy.     Left upper body: No axillary adenopathy.  Skin:    General: Skin is warm and dry.  Neurological:     General: No focal deficit present.     Mental Status: She is  alert and oriented to person, place, and time. Mental status is at baseline.     Gait: Gait normal.  Psychiatric:  Attention and Perception: Attention and perception normal.        Mood and Affect: Mood and affect normal.        Speech: Speech normal.        Behavior: Behavior normal. Behavior is cooperative.        Thought Content: Thought content normal.        Cognition and Memory: Cognition normal.        Judgment: Judgment normal.     Assessment  Plan  Annual physical exam Flu shot utd utdTdap 01/08/13 Disc shingrixprev still not had  covid vx had 2/2 moderna  hep A vaccine1/2 2nd dose due2nd dose hep B immunewith h/o fatty liver Hep C neg MMRimmune A1C check 6.4   Never smoker, chew congratulatednot doing either  mammo6/15/21 b/l diag normal right axillary mass fat pad likely MSK pain/ttp  Colonoscopy -obtained records 07/15/11 colonoscopy Dr. Candace Cruise nl repeat in 10 years  Colonoscopy 01/31/19 Dr. Marius Ditch IH prep fair   Pap s/p hysterectomy h.o fibroids ? Cervix intact has appt Dr. Enzo Bi 02/10/17. -Appt went well1/2019 ob/gynf/u in 1 year  -per notepap not neededshe will f/u with Dr. Marcelline Mates now last seen 03/10/2018 pap today this will be last pap   DEXA consider age 19   [facility] [place] h/o BCC nose and left cheek s/p Mohs and h/o Aks -seen 06/07/19 f/u 12/05/2019 Dr. Brendolyn Patty   05/25/19 [facility] neg retinopathy  Healthy diet and exercise rec.   Acute pain of right shoulder - Plan: DG Shoulder Right Will come back if wants   Obesity, diabetes, and hypertension syndrome ([place]) - Plan: Comprehensive metabolic panel, CBC with Differential/Platelet, Lipid panel, Hemoglobin A1c, TSH Hypertension associated with diabetes ([place]) - Plan: Comprehensive metabolic panel, CBC with Differential/Platelet, Lipid panel, Hemoglobin A1c, TSH Consider maxzide 37.5-25 instead of hctz 25 mg qd, norvasc 5 mg qd    Anxiety and depression - Plan: FLUoxetine (PROZAC) 20 MG tablet qd x 2 weeks then qod x 1 week then stop  Yeast vaginitis - Plan: fluconazole (DIFLUCAN) 150 MG tablet  Hyperlipidemia, unspecified hyperlipidemia type - Plan: Lipid panel    rheumatology Dr. Amil Amen pain in hands and feetcelebrex started as of 01/11/19 pt stopped NS-Dr. Cari Caraway Allergist in [place] and [place] Provider: Dr. Olivia Mackie McLean-[facility]-Internal Medicine

## 2019-08-19 NOTE — Patient Instructions (Addendum)
D3 4000 IU daily  Goal blood <130/<80  Hydrochlorothiazide, HCTZ; Triamterene Oral Tablets or Capsules What is this medicine? HYDROCHLOROTHIAZIDE; TRIAMTERENE (hye droe klor oh THYE a zide; trye AM ter een) is a combination of 2 diuretics. It helps you make more urine and to lose salt and excess water from your body. It treats swelling from heart, kidney, or liver disease. It also treats high blood pressure. This medicine may be used for other purposes; ask your health care provider or pharmacist if you have questions. COMMON BRAND NAME(S): Dyazide, Maxzide What should I tell my health care provider before I take this medicine? They need to know if you have any of these conditions:  diabetes  immune system problems, like lupus  kidney disease or stones  liver disease  small amount of urine or difficulty passing urine  an unusual or allergic reaction to triamterene, hydrochlorothiazide, sulfa drugs, other medicines, foods, dyes, or preservatives  pregnant or trying to get pregnant  breast-feeding How should I use this medicine? Take this drug by mouth. Take it as directed on the prescription label at the same time every day. You can take it with or without food. If it upsets your stomach, take it with food. Keep taking it unless your health care provider tells you to stop. Talk to your health care provider about the use of this drug in children. Special care may be needed. Overdosage: If you think you have taken too much of this medicine contact a poison control center or emergency room at once. NOTE: This medicine is only for you. Do not share this medicine with others. What if I miss a dose? If you miss a dose, take it as soon as you can. If it is almost time for your next dose, take only that dose. Do not take double or extra doses. What may interact with this medicine? Do not take this medicine with any of the following  medications:  cidofovir  dofetilide  eplerenone  potassium supplements  tranylcypromine This medicine may also interact with the following medications:  certain medicines for blood pressure, heart disease like benazepril, lisinopril, losartan, valsartan  lithium  medicines for diabetes  medicines that relax muscles for surgery  NSAIDs, medicines for pain and inflammation, like ibuprofen or naproxen  other diuretics  penicillin G potassium This list may not describe all possible interactions. Give your health care provider a list of all the medicines, herbs, non-prescription drugs, or dietary supplements you use. Also tell them if you smoke, drink alcohol, or use illegal drugs. Some items may interact with your medicine. What should I watch for while using this medicine? Visit your doctor or health care professional for regular check-ups. You will need lab work done before you start this medicine and regularly while you are taking it. Check your blood pressure regularly. Ask your health care professional what your blood pressure should be, and when you should contact them. This medicine may increase blood sugar. Ask your healthcare provider if changes in diet or medicines are needed if you have diabetes. You may need to be on a special diet while taking this medicine. Ask your doctor. Also, ask how many glasses of fluid you need to drink a day. You must not get dehydrated. You may get drowsy or dizzy. Do not drive, use machinery, or do anything that needs mental alertness until you know how this medicine affects you. Do not stand or sit up quickly, especially if you are an older patient. This  reduces the risk of dizzy or fainting spells. Alcohol may interfere with the effect of this medicine. Avoid or limit alcoholic drinks. Talk to your health care professional about your risk of skin cancer. You may be more at risk for skin cancer if you take this medicine. This medicine can make  you more sensitive to the sun. Keep out of the sun. If you cannot avoid being in the sun, wear protective clothing and use sunscreen. Do not use sun lamps or tanning beds/booths. What side effects may I notice from receiving this medicine? Side effects that you should report to your doctor or health care professional as soon as possible:  allergic reactions such as skin rash or itching, hives, swelling of the lips, mouth, tongue, or throat  changes in vision  eye pain  fast or irregular heartbeat, chest pain  feeling faint or dizzy  gout attack  muscle pain or cramps  numbness or tingling in hands, feet, or lips  pain or difficulty when passing urine  redness, blistering, peeling or loosening of the skin, including inside the mouth   signs and symptoms of high blood sugar such as being more thirsty or hungry or having to urinate more than normal. You may also feel very tired or have blurry vision.  shortness of breath  unusually weak Side effects that usually do not require medical attention (report to your doctor or health care professional if they continue or are bothersome):  change in sex drive or performance  dry mouth  headache  stomach upset This list may not describe all possible side effects. Call your doctor for medical advice about side effects. You may report side effects to FDA at 1-800-FDA-1088. Where should I keep my medicine? Keep out of the reach of children and pets. Store at room temperature between 20 and 25 degrees C (68 and 77 degrees F). Protect from light. Throw away any unused drug after the expiration date. NOTE: This sheet is a summary. It may not cover all possible information. If you have questions about this medicine, talk to your doctor, pharmacist, or health care provider.  2020 Elsevier/Gold Standard (2018-09-13 13:00:43)

## 2019-08-22 ENCOUNTER — Other Ambulatory Visit: Payer: Self-pay | Admitting: Gastroenterology

## 2019-08-22 ENCOUNTER — Other Ambulatory Visit: Payer: Self-pay | Admitting: Obstetrics and Gynecology

## 2019-08-22 ENCOUNTER — Other Ambulatory Visit: Payer: Self-pay | Admitting: Internal Medicine

## 2019-08-22 DIAGNOSIS — I10 Essential (primary) hypertension: Secondary | ICD-10-CM

## 2019-08-22 LAB — CYTOLOGY - PAP
Comment: NEGATIVE
Diagnosis: NEGATIVE
High risk HPV: NEGATIVE

## 2019-08-22 MED ORDER — TRIAMTERENE-HCTZ 37.5-25 MG PO TABS: 1.0000 | ORAL_TABLET | Freq: Every day | ORAL | 3 refills | Status: DC

## 2019-08-31 ENCOUNTER — Other Ambulatory Visit: Payer: Self-pay

## 2019-08-31 DIAGNOSIS — J309 Allergic rhinitis, unspecified: Secondary | ICD-10-CM

## 2019-08-31 MED ORDER — FEXOFENADINE HCL 180 MG PO TABS: 180.0000 mg | ORAL_TABLET | Freq: Every day | ORAL | 3 refills | Status: DC | PRN

## 2019-09-11 ENCOUNTER — Other Ambulatory Visit: Payer: Self-pay | Admitting: Internal Medicine

## 2019-09-11 DIAGNOSIS — F419 Anxiety disorder, unspecified: Secondary | ICD-10-CM

## 2019-09-11 DIAGNOSIS — F32A Depression, unspecified: Secondary | ICD-10-CM

## 2019-09-23 ENCOUNTER — Other Ambulatory Visit: Payer: Self-pay | Admitting: Orthopedic Surgery

## 2019-09-23 DIAGNOSIS — G8929 Other chronic pain: Secondary | ICD-10-CM

## 2019-09-23 DIAGNOSIS — M25311 Other instability, right shoulder: Secondary | ICD-10-CM

## 2019-10-03 ENCOUNTER — Ambulatory Visit
Admission: RE | Admit: 2019-10-03 | Discharge: 2019-10-03 | Disposition: A | Discharge status: Home / Self Care | Payer: No Typology Code available for payment source | Source: Ambulatory Visit | Attending: Orthopedic Surgery | Admitting: Orthopedic Surgery

## 2019-10-03 ENCOUNTER — Other Ambulatory Visit: Payer: Self-pay

## 2019-10-03 DIAGNOSIS — M25511 Pain in right shoulder: Secondary | ICD-10-CM | POA: Insufficient documentation

## 2019-10-03 DIAGNOSIS — G8929 Other chronic pain: Secondary | ICD-10-CM | POA: Insufficient documentation

## 2019-10-03 DIAGNOSIS — M25311 Other instability, right shoulder: Secondary | ICD-10-CM | POA: Diagnosis present

## 2019-10-05 ENCOUNTER — Other Ambulatory Visit (INDEPENDENT_AMBULATORY_CARE_PROVIDER_SITE_OTHER): Payer: No Typology Code available for payment source

## 2019-10-05 ENCOUNTER — Other Ambulatory Visit: Payer: Self-pay

## 2019-10-05 DIAGNOSIS — R946 Abnormal results of thyroid function studies: Secondary | ICD-10-CM

## 2019-10-05 LAB — T3, FREE: T3, Free: 3 pg/mL (ref 2.3–4.2)

## 2019-10-05 LAB — TSH: TSH: 3.94 u[IU]/mL (ref 0.35–4.50)

## 2019-10-05 LAB — T4, FREE: Free T4: 0.63 ng/dL (ref 0.60–1.60)

## 2019-10-06 LAB — THYROID PEROXIDASE ANTIBODY: Thyroperoxidase Ab SerPl-aCnc: <1 IU/mL (ref <9)

## 2019-10-13 ENCOUNTER — Ambulatory Visit: Payer: No Typology Code available for payment source

## 2019-10-17 ENCOUNTER — Encounter: Payer: Self-pay | Admitting: Internal Medicine

## 2019-10-20 ENCOUNTER — Other Ambulatory Visit: Payer: Self-pay

## 2019-10-20 DIAGNOSIS — I10 Essential (primary) hypertension: Secondary | ICD-10-CM

## 2019-10-20 MED ORDER — AMLODIPINE BESYLATE 5 MG PO TABS: 5.0000 mg | ORAL_TABLET | Freq: Every day | ORAL | 3 refills | Status: DC

## 2019-10-24 ENCOUNTER — Other Ambulatory Visit: Payer: Self-pay | Admitting: Internal Medicine

## 2019-10-24 DIAGNOSIS — I10 Essential (primary) hypertension: Secondary | ICD-10-CM

## 2019-10-24 MED ORDER — AMLODIPINE BESYLATE 5 MG PO TABS: 5.0000 mg | ORAL_TABLET | Freq: Every day | ORAL | 3 refills | Status: DC

## 2019-11-03 ENCOUNTER — Other Ambulatory Visit: Payer: Self-pay

## 2019-11-10 ENCOUNTER — Other Ambulatory Visit: Payer: Self-pay | Admitting: Gastroenterology

## 2019-11-10 DIAGNOSIS — K580 Irritable bowel syndrome with diarrhea: Secondary | ICD-10-CM

## 2019-11-10 NOTE — Telephone Encounter (Signed)
Last office visit 03/01/2019 IBS  Last refill 08/08/2019 0 refills

## 2019-11-22 ENCOUNTER — Encounter: Payer: Self-pay | Admitting: Family Medicine

## 2019-11-22 ENCOUNTER — Telehealth (INDEPENDENT_AMBULATORY_CARE_PROVIDER_SITE_OTHER): Payer: No Typology Code available for payment source | Admitting: Family Medicine

## 2019-11-22 DIAGNOSIS — G43709 Chronic migraine without aura, not intractable, without status migrainosus: Secondary | ICD-10-CM

## 2019-11-22 MED ORDER — TOPIRAMATE 50 MG PO TABS: 50.0000 mg | ORAL_TABLET | Freq: Two times a day (BID) | ORAL | 3 refills | Status: DC

## 2019-11-22 NOTE — Progress Notes (Addendum)
   PATIENT: Grisela Mesch Prada DOB: 08-28-1960  REASON FOR VISIT: follow up HISTORY FROM: patient  Virtual Visit via Telephone Note  I connected with See Beharry Fredricksen on 11/22/19 at  9:30 AM EDT by telephone and verified that I am speaking with the correct person using two identifiers.   I discussed the limitations, risks, security and privacy concerns of performing an evaluation and management service by telephone and the availability of in person appointments. I also discussed with the patient that there may be a patient responsible charge related to this service. The patient expressed understanding and agreed to proceed.   History of Present Illness:  11/22/19 Aritzel Krusemark Breece is a 59 y.o. female here today for follow up for migraines. She is doing very well. She discontinued Emgality and fluoxetine. She did continue topiramate despite concerns of brain fog. She has not had any episodes of confusion or altered awareness. She feels that migraines are very well managed at this time. She has 1 migraine on average per month. Nurtec works well for abortive therapy. She is in [place] at this time as her sister suffered her 3rd stroke and is currently in ICU.    Observations/Objective:  Generalized: Well developed, in no acute distress  Mentation: Alert oriented to time, place, history taking. Follows all commands speech and language fluent   Assessment and Plan:  59 y.o. year old female  has a past medical history of Allergy, Arthritis, Basal cell carcinoma, Chicken pox, Complication of anesthesia, Depression, Diabetes mellitus without complication ([place]), Fatty liver, Gout, Hematuria (10/15/2018), History of shingles, Hyperlipidemia, Hypertension, Insomnia, Migraine, Obesity, and Osteopenia. here with    ICD-10-CM   1. 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 She will continue topiramate 50mg  at bedtime. Refills  sent to pharmacy. She will continue Nurtec for abortive therapy. Healthy lifestyle habits encouraged. She will continue to monitor any concerns of brain fog. Regular follow up with PCP. Se will return to see [place] in 1 year.   No orders of the defined types were placed in this encounter.   Meds ordered this encounter  Medications  . topiramate (TOPAMAX) 50 MG tablet    Sig: Take 1 tablet (50 mg total) by mouth 2 (two) times daily.    Dispense:  90 tablet    Refill:  3    Order Specific Question:   Supervising Provider    Answer:   Melvenia Beam [id number]     Follow Up Instructions:  I discussed the assessment and treatment plan with the patient. The patient was provided an opportunity to ask questions and all were answered. The patient agreed with the plan and demonstrated an understanding of the instructions.   The patient was advised to call back or seek an in-person evaluation if the symptoms worsen or if the condition fails to improve as anticipated.  I provided 15 minutes of non-face-to-face time during this encounter. Patient located at her sister's home in [place]. Provider is in the office.    Debbora Presto, NP   Made any corrections needed, and agree with history, physical, neuro exam,assessment and plan as stated.     Sarina Ill, MD [facility]

## 2019-12-01 ENCOUNTER — Other Ambulatory Visit: Payer: Self-pay

## 2019-12-01 DIAGNOSIS — I10 Essential (primary) hypertension: Secondary | ICD-10-CM

## 2019-12-01 MED ORDER — TRIAMTERENE-HCTZ 37.5-25 MG PO TABS: 1.0000 | ORAL_TABLET | Freq: Every day | ORAL | 3 refills | Status: DC

## 2019-12-05 ENCOUNTER — Encounter: Payer: No Typology Code available for payment source | Admitting: Dermatology

## 2019-12-17 ENCOUNTER — Other Ambulatory Visit: Payer: Self-pay | Admitting: Gastroenterology

## 2020-01-26 DIAGNOSIS — M25511 Pain in right shoulder: Secondary | ICD-10-CM | POA: Diagnosis not present

## 2020-01-26 DIAGNOSIS — M25611 Stiffness of right shoulder, not elsewhere classified: Secondary | ICD-10-CM | POA: Diagnosis not present

## 2020-02-02 ENCOUNTER — Other Ambulatory Visit: Payer: Self-pay | Admitting: Gastroenterology

## 2020-02-02 DIAGNOSIS — K580 Irritable bowel syndrome with diarrhea: Secondary | ICD-10-CM

## 2020-02-07 ENCOUNTER — Encounter: Payer: Self-pay | Admitting: Internal Medicine

## 2020-02-07 ENCOUNTER — Other Ambulatory Visit: Payer: Self-pay | Admitting: Internal Medicine

## 2020-02-07 DIAGNOSIS — G4700 Insomnia, unspecified: Secondary | ICD-10-CM

## 2020-02-07 DIAGNOSIS — M25611 Stiffness of right shoulder, not elsewhere classified: Secondary | ICD-10-CM | POA: Diagnosis not present

## 2020-02-07 DIAGNOSIS — M25511 Pain in right shoulder: Secondary | ICD-10-CM | POA: Diagnosis not present

## 2020-02-07 MED ORDER — ZOLPIDEM TARTRATE ER 12.5 MG PO TBCR: 12.5000 mg | EXTENDED_RELEASE_TABLET | Freq: Every evening | ORAL | 5 refills | Status: DC | PRN

## 2020-02-09 DIAGNOSIS — M25611 Stiffness of right shoulder, not elsewhere classified: Secondary | ICD-10-CM | POA: Diagnosis not present

## 2020-02-09 DIAGNOSIS — M25511 Pain in right shoulder: Secondary | ICD-10-CM | POA: Diagnosis not present

## 2020-02-14 DIAGNOSIS — M25511 Pain in right shoulder: Secondary | ICD-10-CM | POA: Diagnosis not present

## 2020-02-14 DIAGNOSIS — M25611 Stiffness of right shoulder, not elsewhere classified: Secondary | ICD-10-CM | POA: Diagnosis not present

## 2020-02-16 DIAGNOSIS — M25511 Pain in right shoulder: Secondary | ICD-10-CM | POA: Diagnosis not present

## 2020-02-16 DIAGNOSIS — M25611 Stiffness of right shoulder, not elsewhere classified: Secondary | ICD-10-CM | POA: Diagnosis not present

## 2020-02-21 ENCOUNTER — Ambulatory Visit: Payer: No Typology Code available for payment source | Admitting: Internal Medicine

## 2020-03-01 ENCOUNTER — Encounter: Payer: Self-pay | Admitting: Internal Medicine

## 2020-03-01 ENCOUNTER — Other Ambulatory Visit: Payer: Self-pay

## 2020-03-01 ENCOUNTER — Telehealth (INDEPENDENT_AMBULATORY_CARE_PROVIDER_SITE_OTHER): Payer: Self-pay | Admitting: Internal Medicine

## 2020-03-01 DIAGNOSIS — G44311 Acute post-traumatic headache, intractable: Secondary | ICD-10-CM

## 2020-03-01 DIAGNOSIS — R42 Dizziness and giddiness: Secondary | ICD-10-CM

## 2020-03-01 DIAGNOSIS — Z20822 Contact with and (suspected) exposure to covid-19: Secondary | ICD-10-CM

## 2020-03-01 DIAGNOSIS — J9801 Acute bronchospasm: Secondary | ICD-10-CM

## 2020-03-01 DIAGNOSIS — E119 Type 2 diabetes mellitus without complications: Secondary | ICD-10-CM

## 2020-03-01 DIAGNOSIS — R269 Unspecified abnormalities of gait and mobility: Secondary | ICD-10-CM

## 2020-03-01 DIAGNOSIS — M542 Cervicalgia: Secondary | ICD-10-CM

## 2020-03-01 DIAGNOSIS — R0602 Shortness of breath: Secondary | ICD-10-CM

## 2020-03-01 DIAGNOSIS — J309 Allergic rhinitis, unspecified: Secondary | ICD-10-CM

## 2020-03-01 MED ORDER — ONDANSETRON 4 MG PO TBDP
4.0000 mg | ORAL_TABLET | Freq: Three times a day (TID) | ORAL | 5 refills | Status: DC | PRN
Start: 2020-03-01 — End: 2020-08-27

## 2020-03-01 MED ORDER — ALBUTEROL SULFATE HFA 108 (90 BASE) MCG/ACT IN AERS: 1.0000 | INHALATION_SPRAY | Freq: Four times a day (QID) | RESPIRATORY_TRACT | 11 refills | Status: DC | PRN

## 2020-03-01 MED ORDER — MONTELUKAST SODIUM 10 MG PO TABS: 10.0000 mg | ORAL_TABLET | Freq: Every day | ORAL | 3 refills | Status: DC

## 2020-03-01 MED ORDER — METFORMIN HCL 500 MG PO TABS: 500.0000 mg | ORAL_TABLET | Freq: Two times a day (BID) | ORAL | 3 refills | Status: DC

## 2020-03-01 NOTE — Progress Notes (Signed)
Virtual Visit via Video Note  I connected with Kristin [name]  on 03/01/20 at  2:50 PM EST by a video enabled telemedicine application and verified that I am speaking with the correct person using two identifiers.  Location patient: home, [place] Location provider:work or home office Persons participating in the virtual visit: patient, provider, husband and son  I discussed the limitations of evaluation and management by telemedicine and the availability of in person appointments. The patient expressed understanding and agreed to proceed.   HPI: 1. Fall Tuesday 02/28/20 mechanical tripping over dog and leaned forward to pick something up from the dog and feel forward and crown of head hit the door jam and landed on face and left shoulder with 2 bruises and left hip with some mild pain. She has had h/a and dizziness since and not checked BP 2. covid exposure son and husband + covid in the house had negative rapid test at home only sx is sore throat but normally get this time of the year, denies sinus pain, sob, fever max temp 99 F   -COVID-19 vaccine status: 3/3   ROS: See pertinent positives and negatives per HPI.  Past Medical History:  Diagnosis Date  . Allergy   . Arthritis   . Basal cell carcinoma    nose, left cheek, right upper lip, right posterior shoulder most recent 6 years ago on left cheek  . Chicken pox   . Complication of anesthesia    blood pressure elevated after gastric sleeve surgery in recovery, pt does not want mask over face  . Depression   . Diabetes mellitus without complication ([place])    pre-diabetic currently, was diabetic prior to gastric sleeve surgery   . Disorder of right rotator cuff    10/03/19 MRI right shoulder abnormal Dr. Maurine Cane  . Fatty liver   . Gout   . Hematuria 10/15/2018  . History of shingles   . Hyperlipidemia   . Hypertension    no problems since losing weight after gastric sleeve surgery  . Insomnia   . Migraine   . Obesity   .  Osteopenia     Past Surgical History:  Procedure Laterality Date  . ABDOMINAL HYSTERECTOMY     no h/o abnormal pap still has 1 ovary left  . ANKLE ARTHROSCOPY Left 05/29/2016   Procedure: ANKLE ARTHROSCOPY;  Surgeon: [name], DPM;  Location: [facility];  Service: Podiatry;  Laterality: Left;  . APPENDECTOMY    . basal cell removed    . [place]  . CHOLECYSTECTOMY    . COLONOSCOPY WITH PROPOFOL N/A 01/31/2019   Procedure: COLONOSCOPY WITH PROPOFOL;  Surgeon: Lin Landsman, MD;  Location[facility] ENDOSCOPY;  Service: Gastroenterology;  Laterality: N/A;  . cyst removed from wrist    . ESOPHAGOGASTRODUODENOSCOPY (EGD) WITH PROPOFOL N/A 01/31/2019   Procedure: ESOPHAGOGASTRODUODENOSCOPY (EGD) WITH PROPOFOL;  Surgeon: Lin Landsman, MD;  Location: [place];  Service: Gastroenterology;  Laterality: N/A;  . FOOT SURGERY     left foot 11/2016 Dr. Laymond Purser   . left elbow surgery     07/2017 Dr Leanor Kail epicondylitis   . MOHS SURGERY     x 2 face nose and left cheek   . OSTEOCHONDROMA EXCISION Left 05/29/2016   Procedure: OSTEOCHONDRAL DRILLING TALUS;  Surgeon: [name], DPM;  Location: [place];  Service: Podiatry;  Laterality: Left;  . PLANTAR FASCIA RELEASE Left 05/29/2016   Procedure:  ENDOSCOPIC PLANTAR FASCIOTOMY;  Surgeon: [name], DPM;  Location: [place];  Service: Podiatry;  Laterality: Left;  . ROTATOR CUFF REPAIR     ? right likely shoulder  . STOMACH SURGERY  2017   gastric sleeve surgery  . TENNIS ELBOW RELEASE/NIRSCHEL PROCEDURE Left 08/05/2017   Procedure: TENNIS ELBOW RELEASE/NIRSCHEL PROCEDURE;  Surgeon: Leanor Kail, MD;  Location: [facility] ORS;  Service: Orthopedics;  Laterality: Left;  . TUBAL LIGATION    . wedge resection of ovary  1982     Current Outpatient Medications:  .  montelukast (SINGULAIR) 10 MG tablet, Take 1 tablet (10 mg total) by mouth at bedtime., Disp: 90 tablet, Rfl: 3 .  albuterol (VENTOLIN HFA)  108 (90 Base) MCG/ACT inhaler, Inhale 1-2 puffs into the lungs every 6 (six) hours as needed for wheezing or shortness of breath., Disp: 18 g, Rfl: 11 .  amitriptyline (ELAVIL) 25 MG tablet, TAKE 1 TABLET (25 MG TOTAL) BY MOUTH AT BEDTIME. INCREASE TO 50MG  AFTER 2 WEEKS IF NEEDED, Disp: 180 tablet, Rfl: 0 .  amLODipine (NORVASC) 5 MG tablet, Take 1 tablet (5 mg total) by mouth daily. And stop 2.5 mg daily (2 pills), Disp: 90 tablet, Rfl: 3 .  Cholecalciferol (DIALYVITE VITAMIN D 5000 PO), Take 5,000 Units by mouth every other day., Disp: , Rfl:  .  dicyclomine (BENTYL) 10 MG capsule, TAKE 1 CAPSULE (10 MG TOTAL) BY MOUTH 3 (THREE) TIMES DAILY BETWEEN MEALS AS NEEDED FOR SPASMS., Disp: 30 capsule, Rfl: 0 .  fexofenadine (ALLEGRA) 180 MG tablet, Take 1 tablet (180 mg total) by mouth daily as needed for allergies or rhinitis., Disp: 90 tablet, Rfl: 3 .  metFORMIN (GLUCOPHAGE) 500 MG tablet, Take 1 tablet (500 mg total) by mouth 2 (two) times daily with a meal., Disp: 180 tablet, Rfl: 3 .  omeprazole (PRILOSEC) 40 MG capsule, TAKE 1 CAPSULE (40 MG TOTAL) BY MOUTH DAILY BEFORE SUPPER., Disp: 90 capsule, Rfl: 0 .  ondansetron (ZOFRAN-ODT) 4 MG disintegrating tablet, Take 1 tablet (4 mg total) by mouth every 8 (eight) hours as needed. for nausea, Disp: 30 tablet, Rfl: 5 .  Rimegepant Sulfate (NURTEC) 75 MG TBDP, Take 75 mg by mouth daily as needed (take for abortive therapy of migraine, no more than 1 tablet in 24 hours or 10 per month)., Disp: 10 tablet, Rfl: 11 .  rosuvastatin (CRESTOR) 20 MG tablet, Take 1 tablet (20 mg total) by mouth at bedtime., Disp: 90 tablet, Rfl: 3 .  topiramate (TOPAMAX) 50 MG tablet, Take 1 tablet (50 mg total) by mouth 2 (two) times daily., Disp: 90 tablet, Rfl: 3 .  triamterene-hydrochlorothiazide (MAXZIDE-25) 37.5-25 MG tablet, Take 1 tablet by mouth daily. In the am, Disp: 90 tablet, Rfl: 3 .  zolpidem (AMBIEN CR) 12.5 MG CR tablet, Take 1 tablet (12.5 mg total) by mouth at  bedtime as needed., Disp: 30 tablet, Rfl: 5  EXAM:  VITALS per patient if applicable:  GENERAL: alert, oriented, appears well and in no acute distress  HEENT: atraumatic, conjunttiva clear, no obvious abnormalities on inspection of external nose and ears  NECK: normal movements of the head and neck  LUNGS: on inspection no signs of respiratory distress, breathing rate appears normal, no obvious gross SOB, gasping or wheezing  CV: no obvious cyanosis  MS: moves all visible extremities without noticeable abnormality  PSYCH/NEURO: pleasant and cooperative, no obvious depression or anxiety, speech and thought processing grossly intact  ASSESSMENT AND PLAN:  Discussed the following assessment  and plan:  Close exposure to COVID-19 virus (son and husband + in the house) - Plan: Novel Coronavirus, NAA ([facility]) Given otc covid meds to try in my charge message from 03/01/20  Worked pt in 1 day sooner than 03/02/20   Intractable acute post-traumatic headache and Dizziness s/p fall 02/28/20  Abnormal gait Pt feel forward and head hit object pushing crown of head and neck backwards when she landed  CT head and C spine rec monitor BP she has not checked today though she is taking her BP meds    Refills of chronic meds: Allergic rhinitis, unspecified seasonality, unspecified trigger Refilled singulair 10 mg qhs  Bronchospasm - Plan: albuterol (VENTOLIN HFA) 108 (90 Base) MCG/ACT inhaler  Type 2 diabetes mellitus without complication, without long-term current use of insulin ([place]) - Plan: metFORMIN (GLUCOPHAGE) 500 MG tablet   -we discussed possible serious and likely etiologies, options for evaluation and workup, limitations of telemedicine visit vs in person visit, treatment, treatment risks and precautions.    I discussed the assessment and treatment plan with the patient. The patient was provided an opportunity to ask questions and all were answered. The patient agreed with the plan  and demonstrated an understanding of the instructions.    Time spent 20 min Delorise Jackson, MD

## 2020-03-02 ENCOUNTER — Ambulatory Visit
Admission: RE | Admit: 2020-03-02 | Discharge: 2020-03-02 | Disposition: A | Discharge status: Home / Self Care | Payer: BC Managed Care – PPO | Source: Ambulatory Visit | Attending: Internal Medicine | Admitting: Internal Medicine

## 2020-03-02 ENCOUNTER — Telehealth: Payer: Self-pay | Admitting: Internal Medicine

## 2020-03-02 ENCOUNTER — Other Ambulatory Visit: Payer: Self-pay

## 2020-03-02 ENCOUNTER — Encounter: Payer: Self-pay | Admitting: Internal Medicine

## 2020-03-02 DIAGNOSIS — R269 Unspecified abnormalities of gait and mobility: Secondary | ICD-10-CM | POA: Diagnosis not present

## 2020-03-02 DIAGNOSIS — M542 Cervicalgia: Secondary | ICD-10-CM | POA: Insufficient documentation

## 2020-03-02 DIAGNOSIS — R519 Headache, unspecified: Secondary | ICD-10-CM | POA: Diagnosis not present

## 2020-03-02 DIAGNOSIS — R42 Dizziness and giddiness: Secondary | ICD-10-CM | POA: Diagnosis not present

## 2020-03-02 DIAGNOSIS — G44311 Acute post-traumatic headache, intractable: Secondary | ICD-10-CM | POA: Insufficient documentation

## 2020-03-02 DIAGNOSIS — S0990XA Unspecified injury of head, initial encounter: Secondary | ICD-10-CM | POA: Diagnosis not present

## 2020-03-02 LAB — NOVEL CORONAVIRUS, NAA: SARS-CoV-2, NAA: DETECTED — AB

## 2020-03-02 LAB — SARS-COV-2, NAA 2 DAY TAT

## 2020-03-02 NOTE — Telephone Encounter (Signed)
Call report from imaging Impression: For CT Head and spine  CT of Head  IMPRESSION: No acute intracranial injury.  No calvarial fracture.  No acute fracture or listhesis of the cervical spine.    CT of spine Impression  IMPRESSION: No acute intracranial injury.  No calvarial fracture.

## 2020-03-03 ENCOUNTER — Ambulatory Visit (HOSPITAL_COMMUNITY)
Admission: RE | Admit: 2020-03-03 | Discharge: 2020-03-03 | Disposition: A | Discharge status: Home / Self Care | Payer: BC Managed Care – PPO | Source: Ambulatory Visit | Attending: Pulmonary Disease | Admitting: Pulmonary Disease

## 2020-03-03 ENCOUNTER — Other Ambulatory Visit: Payer: Self-pay | Admitting: Physician Assistant

## 2020-03-03 DIAGNOSIS — U071 COVID-19: Secondary | ICD-10-CM

## 2020-03-03 DIAGNOSIS — E119 Type 2 diabetes mellitus without complications: Secondary | ICD-10-CM

## 2020-03-03 DIAGNOSIS — I10 Essential (primary) hypertension: Secondary | ICD-10-CM

## 2020-03-03 DIAGNOSIS — E669 Obesity, unspecified: Secondary | ICD-10-CM

## 2020-03-03 MED ORDER — ALBUTEROL SULFATE HFA 108 (90 BASE) MCG/ACT IN AERS: 2.0000 | INHALATION_SPRAY | Freq: Once | RESPIRATORY_TRACT | Status: DC | PRN

## 2020-03-03 MED ORDER — SOTROVIMAB 500 MG/8ML IV SOLN
500.0000 mg | Freq: Once | INTRAVENOUS | Status: AC
  Administered 2020-03-03: 500 mg via INTRAVENOUS

## 2020-03-03 MED ORDER — SODIUM CHLORIDE 0.9 % IV SOLN: INTRAVENOUS | Status: DC | PRN

## 2020-03-03 MED ORDER — METHYLPREDNISOLONE SODIUM SUCC 125 MG IJ SOLR: 125.0000 mg | Freq: Once | INTRAMUSCULAR | Status: DC | PRN

## 2020-03-03 MED ORDER — DIPHENHYDRAMINE HCL 50 MG/ML IJ SOLN: 50.0000 mg | Freq: Once | INTRAMUSCULAR | Status: DC | PRN

## 2020-03-03 MED ORDER — FAMOTIDINE IN NACL 20-0.9 MG/50ML-% IV SOLN: 20.0000 mg | Freq: Once | INTRAVENOUS | Status: DC | PRN

## 2020-03-03 MED ORDER — EPINEPHRINE 0.3 MG/0.3ML IJ SOAJ: 0.3000 mg | Freq: Once | INTRAMUSCULAR | Status: DC | PRN

## 2020-03-03 NOTE — Progress Notes (Signed)
Diagnosis: COVID-19  Physician: Dr. Patrick Wright  Procedure: [facility] Infusion Clinic Med: Sotrovimab infusion - Provided patient with sotrovimab fact sheet for patients, parents, and caregivers prior to infusion.   Complications: No immediate complications noted  Discharge: Discharged home    

## 2020-03-03 NOTE — Progress Notes (Signed)
I connected by phone with Kristin [name] on 03/03/2020 at 9:42 AM to discuss the potential use of a new treatment for mild to moderate COVID-19 viral infection in non-hospitalized patients.  This patient is a 60 y.o. female that meets the FDA criteria for Emergency Use Authorization of COVID monoclonal antibody sotrovimab.  Has a (+) direct SARS-CoV-2 viral test result  Has mild or moderate COVID-19   Is NOT hospitalized due to COVID-19  Is within 10 days of symptom onset  Has at least one of the high risk factor(s) for progression to severe COVID-19 and/or hospitalization as defined in EUA.  Specific high risk criteria : BMI > 25, Diabetes and Immunosuppressive Disease or Treatment   I have spoken and communicated the following to the patient or parent/caregiver regarding COVID monoclonal antibody treatment:  1. FDA has authorized the emergency use for the treatment of mild to moderate COVID-19 in adults and pediatric patients with positive results of direct SARS-CoV-2 viral testing who are 49 years of age and older weighing at least 40 kg, and who are at high risk for progressing to severe COVID-19 and/or hospitalization.  2. The significant known and potential risks and benefits of COVID monoclonal antibody, and the extent to which such potential risks and benefits are unknown.  3. Information on available alternative treatments and the risks and benefits of those alternatives, including clinical trials.  4. Patients treated with COVID monoclonal antibody should continue to self-isolate and use infection control measures (e.g., wear mask, isolate, social distance, avoid sharing personal items, clean and disinfect "high touch" surfaces, and frequent handwashing) according to CDC guidelines.   5. The patient or parent/caregiver has the option to accept or refuse COVID monoclonal antibody treatment.  After reviewing this information with the patient, the patient has agreed to  receive one of the available covid 19 monoclonal antibodies and will be provided an appropriate fact sheet prior to infusion.   [place], [place] 03/03/2020 9:42 AM

## 2020-03-03 NOTE — Progress Notes (Signed)
Patient reviewed Fact Sheet for Patients, Parents, and Caregivers for Emergency Use Authorization (EUA) of sotrovimab for the Treatment of Coronavirus. Patient also reviewed and is agreeable to the estimated cost of treatment. Patient is agreeable to proceed.   

## 2020-03-03 NOTE — Discharge Instructions (Signed)

## 2020-03-05 ENCOUNTER — Ambulatory Visit: Admission: RE | Admit: 2020-03-05 | Payer: Self-pay | Source: Ambulatory Visit

## 2020-03-06 ENCOUNTER — Ambulatory Visit: Payer: No Typology Code available for payment source | Admitting: Internal Medicine

## 2020-03-20 ENCOUNTER — Other Ambulatory Visit: Payer: Self-pay

## 2020-03-20 ENCOUNTER — Ambulatory Visit (INDEPENDENT_AMBULATORY_CARE_PROVIDER_SITE_OTHER): Payer: BC Managed Care – PPO | Admitting: Dermatology

## 2020-03-20 ENCOUNTER — Encounter: Payer: Self-pay | Admitting: Dermatology

## 2020-03-20 DIAGNOSIS — Z1283 Encounter for screening for malignant neoplasm of skin: Secondary | ICD-10-CM

## 2020-03-20 DIAGNOSIS — L820 Inflamed seborrheic keratosis: Secondary | ICD-10-CM | POA: Diagnosis not present

## 2020-03-20 DIAGNOSIS — Z85828 Personal history of other malignant neoplasm of skin: Secondary | ICD-10-CM

## 2020-03-20 DIAGNOSIS — D692 Other nonthrombocytopenic purpura: Secondary | ICD-10-CM

## 2020-03-20 DIAGNOSIS — L719 Rosacea, unspecified: Secondary | ICD-10-CM | POA: Diagnosis not present

## 2020-03-20 DIAGNOSIS — L814 Other melanin hyperpigmentation: Secondary | ICD-10-CM

## 2020-03-20 DIAGNOSIS — D229 Melanocytic nevi, unspecified: Secondary | ICD-10-CM

## 2020-03-20 DIAGNOSIS — L578 Other skin changes due to chronic exposure to nonionizing radiation: Secondary | ICD-10-CM | POA: Diagnosis not present

## 2020-03-20 DIAGNOSIS — L853 Xerosis cutis: Secondary | ICD-10-CM

## 2020-03-20 DIAGNOSIS — L821 Other seborrheic keratosis: Secondary | ICD-10-CM

## 2020-03-20 DIAGNOSIS — D1800 Hemangioma unspecified site: Secondary | ICD-10-CM

## 2020-03-20 MED ORDER — METRONIDAZOLE 0.75 % EX CREA: TOPICAL_CREAM | CUTANEOUS | 5 refills | Status: DC

## 2020-03-20 NOTE — Patient Instructions (Addendum)
Cryotherapy Aftercare  . Wash gently with soap and water everyday.   Marland Kitchen Apply Vaseline and Band-Aid daily until healed.   Rosacea  What is rosacea? Rosacea (say: ro-zay-sha) is a common skin disease that usually begins as a trend of flushing or blushing easily.  As rosacea progresses, a persistent redness in the center of the face will develop and may gradually spread beyond the nose and cheeks to the forehead and chin.  In some cases, the ears, chest, and back could be affected.  Rosacea may appear as tiny blood vessels or small red bumps that occur in crops.  Frequently they can contain pus, and are called "pustules".  If the bumps do not contain pus, they are referred to as "papules".  Rarely, in prolonged, untreated cases of rosacea, the oil glands of the nose and cheeks may become permanently enlarged.  This is called rhinophyma, and is seen more frequently in men.  Signs and Risks In its beginning stages, rosacea tends to come and go, which makes it difficult to recognize.  It can start as intermittent flushing of the face.  Eventually, blood vessels may become permanently visible.  Pustules and papules can appear, but can be mistaken for adult acne.  People of all races, ages, genders and ethnic groups are at risk of developing rosacea.  However, it is more common in women (especially around menopause) and adults with fair skin between the ages of 67 and 43.  Treatment Dermatologists typically recommend a combination of treatments to effectively manage rosacea.  Treatment can improve symptoms and may stop the progression of the rosacea.  Treatment may involve both topical and oral medications.  The tetracycline antibiotics are often used for their anti-inflammatory effect; however, because of the possibility of developing antibiotic resistance, they should not be used long term at full dose.  For dilated blood vessels the options include electrodessication (uses electric current through a small  needle), laser treatment, and cosmetics to hide the redness.   With all forms of treatment, improvement is a slow process, and patients may not see any results for the first 3-4 weeks.  It is very important to avoid the sun and other triggers.  Patients must wear sunscreen daily.  Skin Care Instructions: 1. Cleanse the skin with a mild soap such as CeraVe cleanser, Cetaphil cleanser, or Dove soap once or twice daily as needed. 2. Moisturize with Eucerin Redness Relief Daily Perfecting Lotion (has a subtle green tint), CeraVe Moisturizing Cream, or Oil of Olay Daily Moisturizer with sunscreen every morning and/or night as recommended. 3. Makeup should be "non-comedogenic" (won't clog pores) and be labeled "for sensitive skin". Good choices for cosmetics are: Neutrogena, Almay, and Physician's Formula.  Any product with a green tint tends to offset a red complexion. 4. If your eyes are dry and irritated, use artificial tears 2-3 times per day and cleanse the eyelids daily with baby shampoo.  Have your eyes examined at least every 2 years.  Be sure to tell your eye doctor that you have rosacea. 5. Alcoholic beverages tend to cause flushing of the skin, and may make rosacea worse. 6. Always wear sunscreen, protect your skin from extreme hot and cold temperatures, and avoid spicy foods, hot drinks, and mechanical irritation such as rubbing, scrubbing, or massaging the face.  Avoid harsh skin cleansers, cleansing masks, astringents, and exfoliation. If a particular product burns or makes your face feel tight, then it is likely to flare your rosacea. 7. If you are  having difficulty finding a sunscreen that you can tolerate, you may try switching to a chemical-free sunscreen.  These are ones whose active ingredient is zinc oxide or titanium dioxide only.  They should also be fragrance free, non-comedogenic, and labeled for sensitive skin. 8. Rosacea triggers may vary from person to person.  There are a variety of  foods that have been reported to trigger rosacea.  Some patients find that keeping a diary of what they were doing when they flared helps them avoid triggers.

## 2020-03-20 NOTE — Progress Notes (Signed)
Follow-Up Visit   Subjective  Kristin [name] is a 60 y.o. female who presents for the following: Annual Exam (Patient here today for TBSE. She has a rough growth on her left chest and her left temple hairline that is bothersome. She has a history of BCCs of the nose, left cheek, right upper lip, right posterior shoulder.).  The following portions of the chart were reviewed this encounter and updated as appropriate:       Review of Systems:  No other skin or systemic complaints except as noted in HPI or Assessment and Plan.  Objective  Well appearing patient in no apparent distress; mood and affect are within normal limits.  A full examination was performed including scalp, head, eyes, ears, nose, lips, neck, chest, axillae, abdomen, back, buttocks, bilateral upper extremities, bilateral lower extremities, hands, feet, fingers, toes, fingernails, and toenails. All findings within normal limits unless otherwise noted below.  Objective  L clavicle x 3, R neck x 2, R clavicle x 1, L temple x 1 (7): Erythematous keratotic or waxy stuck-on papule  Objective  face: Erythema on the nose and malar cheeks.   Assessment & Plan   Skin cancer screening performed today.  Actinic Damage - chronic, secondary to cumulative UV radiation exposure/sun exposure over time - diffuse scaly erythematous macules with underlying dyspigmentation - Recommend daily broad spectrum sunscreen SPF 30+ to sun-exposed areas, reapply every 2 hours as needed.  - Call for new or changing lesions.  Seborrheic Keratoses - Stuck-on, waxy, tan-brown papules and plaques  - Discussed benign etiology and prognosis. - Observe - Call for any changes  Lentigines - Scattered tan macules - Due to sun exposure - Benign-appering, observe - Recommend daily broad spectrum sunscreen SPF 30+ to sun-exposed areas, reapply every 2 hours as needed. - Call for any changes  Inflamed seborrheic keratosis (7) L clavicle x 3,  R neck x 2, R clavicle x 1, L temple x 1  Destruction of lesion - L clavicle x 3, R neck x 2, R clavicle x 1, L temple x 1  Destruction method: cryotherapy   Informed consent: discussed and consent obtained   Lesion destroyed using liquid nitrogen: Yes   Region frozen until ice ball extended beyond lesion: Yes   Outcome: patient tolerated procedure well with no complications   Post-procedure details: wound care instructions given    Rosacea face  Rosacea is a chronic progressive skin condition usually affecting the face of adults, causing redness and/or acne bumps. It is treatable but not curable. It sometimes affects the eyes (ocular rosacea) as well. It may respond to topical and/or systemic medication and can flare with stress, sun exposure, alcohol, exercise and some foods.  Daily application of broad spectrum spf 30+ sunscreen to face is recommended to reduce flares.  Start metronidazole 0.75% cream Apply to AA face qhs dsp 45g 5Rf.  Discussed laser treatment, patient defers.  metroNIDAZOLE (METROCREAM) 0.75 % cream - face    Melanocytic Nevi - Tan-brown and/or pink-flesh-colored symmetric macules and papules, including right flank - Benign appearing on exam today - Observation - Call clinic for new or changing moles - Recommend daily use of broad spectrum spf 30+ sunscreen to sun-exposed areas.   Purpura - Chronic; persistent and recurrent.  Treatable, but not curable. - Violaceous macules and patches - Benign - Related to trauma, age, sun damage and/or use of blood thinners, chronic use of topical and/or oral steroids - Observe - Can use OTC arnica  containing moisturizer such as Dermend Bruise Formula if desired - Call for worsening or other concerns  Xerosis - diffuse xerotic patches - recommend gentle, hydrating skin care - gentle skin care handout given  Hemangiomas - Red papules - Discussed benign nature - Observe - Call for any changes  History of Basal  Cell Carcinoma of the Skin - No evidence of recurrence today - Recommend regular full body skin exams - Recommend daily broad spectrum sunscreen SPF 30+ to sun-exposed areas, reapply every 2 hours as needed.  - Call if any new or changing lesions are noted between office visits  Return in about 1 year (around 03/20/2021) for TBSE.   IJamesetta Orleans, CMA, am acting as scribe for Brendolyn Patty, MD .  Documentation: I have reviewed the above documentation for accuracy and completeness, and I agree with the above.  Brendolyn Patty MD

## 2020-03-28 ENCOUNTER — Encounter: Payer: Self-pay | Admitting: Internal Medicine

## 2020-03-28 ENCOUNTER — Telehealth (INDEPENDENT_AMBULATORY_CARE_PROVIDER_SITE_OTHER): Payer: BC Managed Care – PPO | Admitting: Internal Medicine

## 2020-03-28 VITALS — BP 140/85 | HR 93 | Ht 63.0 in | Wt 180.0 lb

## 2020-03-28 DIAGNOSIS — L509 Urticaria, unspecified: Secondary | ICD-10-CM | POA: Diagnosis not present

## 2020-03-28 DIAGNOSIS — F439 Reaction to severe stress, unspecified: Secondary | ICD-10-CM | POA: Diagnosis not present

## 2020-03-28 DIAGNOSIS — T148XXA Other injury of unspecified body region, initial encounter: Secondary | ICD-10-CM | POA: Diagnosis not present

## 2020-03-28 DIAGNOSIS — F419 Anxiety disorder, unspecified: Secondary | ICD-10-CM

## 2020-03-28 DIAGNOSIS — G4700 Insomnia, unspecified: Secondary | ICD-10-CM

## 2020-03-28 HISTORY — DX: Urticaria, unspecified: L50.9

## 2020-03-28 MED ORDER — HYDROXYZINE HCL 25 MG PO TABS
25.0000 mg | ORAL_TABLET | Freq: Two times a day (BID) | ORAL | 2 refills | Status: DC | PRN
Start: 2020-03-28 — End: 2020-04-23

## 2020-03-28 MED ORDER — MUPIROCIN 2 % EX OINT: 1.0000 "application " | TOPICAL_OINTMENT | Freq: Two times a day (BID) | CUTANEOUS | 0 refills | Status: DC

## 2020-03-28 MED ORDER — TRIAMCINOLONE ACETONIDE 0.1 % EX CREA: 1.0000 "application " | TOPICAL_CREAM | Freq: Two times a day (BID) | CUTANEOUS | 0 refills | Status: DC

## 2020-03-28 NOTE — Patient Instructions (Signed)
[facility] [place]  [address] [place] [address] [phone]   [facility] [place]  [address]  [place] [address]  [phone] 

## 2020-03-28 NOTE — Progress Notes (Signed)
Virtual Visit via Video Note  I connected with Kristin [name]  on 03/28/20 at 4:00 PM EST by a video enabled telemedicine application and verified that I am speaking with the correct person using two identifiers.  Location patient: home, [place] Location provider:work or home office Persons participating in the virtual visit: patient, provider  I discussed the limitations of evaluation and management by telemedicine and the availability of in person appointments. The patient expressed understanding and agreed to proceed.   HPI: 1. Rash trunk/arms/hip/lower back itching worse when hot see my chart pics since Saturday or Sunday unable to sleep due to itching  She has h/o hives with stress in the past but has not tried anything. Lesions do not stay long and move to another area 2. Increases stress/anxiety/insomnia and feeling more clumsy (I.e slammed finger and stomped left toe recently, always doing something to bruise skin) triggers feeling like a teapot about to explode. A. Mom had recent pet scan she found out able weeks ago and her cancer is spreading though her mom does not complain and they went on a cruise 01/2020 and camping 2/22 mom lives in [place] and she frequently visits her  B. She is happy her son is working and husband  C. 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 She does house chores daily but does have help from her husband 3. covid +03/01/20 with MAB infusion 03/03/20 she had h/a, sore throat fatigue and after infusion more energy  4. Saw dermatology recently and LN2 areas are itching her and still healing   -COVID-19 vaccine status: 3/3  ROS: See pertinent positives and negatives per HPI.  Past Medical History:  Diagnosis Date  . Allergy   . Arthritis   . Basal cell carcinoma    nose, left cheek, right upper lip, right posterior shoulder most recent 6 years ago on left cheek  . Chicken pox   . Complication of anesthesia    blood  pressure elevated after gastric sleeve surgery in recovery, pt does not want mask over face  . COVID-19    03/01/20  . Depression   . Diabetes mellitus without complication ([place])    pre-diabetic currently, was diabetic prior to gastric sleeve surgery   . Disorder of right rotator cuff    10/03/19 MRI right shoulder abnormal Dr. Maurine Cane  . Fatty liver   . Gout   . Hematuria 10/15/2018  . History of shingles   . Hyperlipidemia   . Hypertension    no problems since losing weight after gastric sleeve surgery  . Insomnia   . Migraine   . Obesity   . Osteopenia     Past Surgical History:  Procedure Laterality Date  . ABDOMINAL HYSTERECTOMY     no h/o abnormal pap still has 1 ovary left  . ANKLE ARTHROSCOPY Left 05/29/2016   Procedure: ANKLE ARTHROSCOPY;  Surgeon: [name], DPM;  Location: [facility];  Service: Podiatry;  Laterality: Left;  . APPENDECTOMY    . basal cell removed    . [place]  . CHOLECYSTECTOMY    . COLONOSCOPY WITH PROPOFOL N/A 01/31/2019   Procedure: COLONOSCOPY WITH PROPOFOL;  Surgeon: Lin Landsman, MD;  Location: [facility] ENDOSCOPY;  Service: Gastroenterology;  Laterality: N/A;  . cyst removed from wrist    . ESOPHAGOGASTRODUODENOSCOPY (EGD) WITH PROPOFOL N/A 01/31/2019   Procedure: ESOPHAGOGASTRODUODENOSCOPY (EGD) WITH PROPOFOL;  Surgeon:  Lin Landsman, MD;  Location: [facility] ENDOSCOPY;  Service: Gastroenterology;  Laterality: N/A;  . FOOT SURGERY     left foot 11/2016 Dr. Laymond Purser   . left elbow surgery     07/2017 Dr Leanor Kail epicondylitis   . MOHS SURGERY     x 2 face nose and left cheek   . OSTEOCHONDROMA EXCISION Left 05/29/2016   Procedure: OSTEOCHONDRAL DRILLING TALUS;  Surgeon: [name], DPM;  Location: [facility];  Service: Podiatry;  Laterality: Left;  . PLANTAR FASCIA RELEASE Left 05/29/2016   Procedure: ENDOSCOPIC PLANTAR FASCIOTOMY;  Surgeon: [name], DPM;  Location: [facility];  Service: Podiatry;   Laterality: Left;  . ROTATOR CUFF REPAIR     ? right likely shoulder  . STOMACH SURGERY  2017   gastric sleeve surgery  . TENNIS ELBOW RELEASE/NIRSCHEL PROCEDURE Left 08/05/2017   Procedure: TENNIS ELBOW RELEASE/NIRSCHEL PROCEDURE;  Surgeon: Leanor Kail, MD;  Location: [facility] ORS;  Service: Orthopedics;  Laterality: Left;  . TUBAL LIGATION    . wedge resection of ovary  1982     Current Outpatient Medications:  .  amitriptyline (ELAVIL) 25 MG tablet, TAKE 1 TABLET (25 MG TOTAL) BY MOUTH AT BEDTIME. INCREASE TO 50MG AFTER 2 WEEKS IF NEEDED, Disp: 180 tablet, Rfl: 0 .  amLODipine (NORVASC) 5 MG tablet, Take 1 tablet (5 mg total) by mouth daily. And stop 2.5 mg daily (2 pills), Disp: 90 tablet, Rfl: 3 .  fexofenadine (ALLEGRA) 180 MG tablet, Take 1 tablet (180 mg total) by mouth daily as needed for allergies or rhinitis., Disp: 90 tablet, Rfl: 3 .  hydrOXYzine (ATARAX/VISTARIL) 25 MG tablet, Take 1 tablet (25 mg total) by mouth 2 (two) times daily as needed., Disp: 60 tablet, Rfl: 2 .  metFORMIN (GLUCOPHAGE) 500 MG tablet, Take 1 tablet (500 mg total) by mouth 2 (two) times daily with a meal., Disp: 180 tablet, Rfl: 3 .  montelukast (SINGULAIR) 10 MG tablet, Take 1 tablet (10 mg total) by mouth at bedtime., Disp: 90 tablet, Rfl: 3 .  mupirocin ointment (BACTROBAN) 2 %, Apply 1 application topically 2 (two) times daily., Disp: 30 g, Rfl: 0 .  omeprazole (PRILOSEC) 40 MG capsule, TAKE 1 CAPSULE (40 MG TOTAL) BY MOUTH DAILY BEFORE SUPPER., Disp: 90 capsule, Rfl: 0 .  rosuvastatin (CRESTOR) 20 MG tablet, Take 1 tablet (20 mg total) by mouth at bedtime., Disp: 90 tablet, Rfl: 3 .  topiramate (TOPAMAX) 50 MG tablet, Take 1 tablet (50 mg total) by mouth 2 (two) times daily., Disp: 90 tablet, Rfl: 3 .  triamcinolone (KENALOG) 0.1 %, Apply 1 application topically 2 (two) times daily. Prn itching hives on body not face, Disp: 454 g, Rfl: 0 .  triamterene-hydrochlorothiazide (MAXZIDE-25) 37.5-25 MG  tablet, Take 1 tablet by mouth daily. In the am, Disp: 90 tablet, Rfl: 3 .  zolpidem (AMBIEN CR) 12.5 MG CR tablet, Take 1 tablet (12.5 mg total) by mouth at bedtime as needed., Disp: 30 tablet, Rfl: 5 .  albuterol (VENTOLIN HFA) 108 (90 Base) MCG/ACT inhaler, Inhale 1-2 puffs into the lungs every 6 (six) hours as needed for wheezing or shortness of breath. (Patient not taking: Reported on 03/28/2020), Disp: 18 g, Rfl: 11 .  Cholecalciferol (DIALYVITE VITAMIN D 5000 PO), Take 5,000 Units by mouth every other day. (Patient not taking: Reported on 03/28/2020), Disp: , Rfl:  .  dicyclomine (BENTYL) 10 MG capsule, TAKE 1 CAPSULE (10 MG TOTAL) BY MOUTH 3 (THREE) TIMES  DAILY BETWEEN MEALS AS NEEDED FOR SPASMS. (Patient not taking: Reported on 03/28/2020), Disp: 30 capsule, Rfl: 0 .  metroNIDAZOLE (METROCREAM) 0.75 % cream, Apply to cheeks and nose every night for rosacea. (Patient not taking: Reported on 03/28/2020), Disp: 45 g, Rfl: 5 .  ondansetron (ZOFRAN-ODT) 4 MG disintegrating tablet, Take 1 tablet (4 mg total) by mouth every 8 (eight) hours as needed. for nausea (Patient not taking: Reported on 03/28/2020), Disp: 30 tablet, Rfl: 5 .  Rimegepant Sulfate (NURTEC) 75 MG TBDP, Take 75 mg by mouth daily as needed (take for abortive therapy of migraine, no more than 1 tablet in 24 hours or 10 per month). (Patient not taking: Reported on 03/28/2020), Disp: 10 tablet, Rfl: 11  EXAM:  VITALS per patient if applicable:  GENERAL: alert, oriented, appears well and in no acute distress  HEENT: atraumatic, conjunttiva clear, no obvious abnormalities on inspection of external nose and ears  NECK: normal movements of the head and neck  LUNGS: on inspection no signs of respiratory distress, breathing rate appears normal, no obvious gross SOB, gasping or wheezing  CV: no obvious cyanosis  MS: moves all visible extremities without noticeable abnormality  PSYCH/NEURO: pleasant and cooperative, no obvious depression or  anxiety, speech and thought processing grossly intact Skin ? Pustular hives to arms/trunk/back   ASSESSMENT AND PLAN:  Discussed the following assessment and plan:  Hives - Plan: triamcinolone (KENALOG) 0.1 %, hydrOXYzine (ATARAX/VISTARIL) 25 MG tablet Reduce stress rec sch massage  Call therapy   Open wound s/p ln2 with dermatology - Plan: mupirocin ointment (BACTROBAN) 2 % bid prn mix with hc 1% otc until healed   Stress Anxiety Insomnia, unspecified type [facility]  [place] [address] [phone]   [facility] [place]  [address] [place] [address]  [phone]  HM-fasting labs appt 03/2020  Flu shot utd9/29/21 or 10/21/19 utdTdap12/20/14 Disc shingrixprevstill not had covid vxhad 3/30moerna hep A vaccine1/2 2nd dose due2nd dose hep B immunewith h/o fatty liver Hep C neg MMRimmune  Never smoker, chew congratulatednot doing either  mammo6/15/21 b/l diagnormal right axillary mass fat pad likely MSK pain/ttp -screening mammo in 1 year   Colonoscopy -obtained records 07/15/11 colonoscopy Dr. OCandace Cruisenl repeat in 10 years  Colonoscopy 01/31/19 Dr. VMarius DitchIH prep fairhemmorhiods, tubular adenoma f/u in 5 years likely with GI  Pap s/p hysterectomyh.o fibroids? Cervix intact has appt Dr. DEnzo Bi1/22/19. -Appt went well1/2019 ob/gynf/u in 1 year  -per notepap not neededshe will f/u with Dr. CMilus Banisterseen 03/10/2018 pap 08/19/19 neg neg HPV  DEXA consider age 60 [place] [place] h/o BCC nose and left cheek s/p Mohs and h/o Aks -seen 06/07/19 f/u 11/15/2021Dr. TBrendolyn Patty 5/5/21[place] eyeneg retinopathy   Healthy diet and exercise rec.   -we discussed possible serious and likely etiologies, options for evaluation and workup, limitations of telemedicine visit vs in person  visit, treatment, treatment risks and precautions.   I discussed the assessment and treatment plan with the patient. The patient was provided an opportunity to ask questions and all were answered. The patient agreed with the plan and demonstrated an understanding of the instructions.    Time spent 20 min TDelorise Jackson MD

## 2020-03-28 NOTE — Progress Notes (Signed)
Patient states there are little red dots that popped up when feeling stressed. Patient states she is very stressed now and does not know why.   Stress started Saturday and dots popped up yesterday. Rash itches when hot. On the hip, arm, breast, and stomach.

## 2020-03-30 ENCOUNTER — Other Ambulatory Visit: Payer: Self-pay | Admitting: Gastroenterology

## 2020-04-05 ENCOUNTER — Other Ambulatory Visit: Payer: Self-pay

## 2020-04-05 ENCOUNTER — Ambulatory Visit (INDEPENDENT_AMBULATORY_CARE_PROVIDER_SITE_OTHER): Payer: BC Managed Care – PPO | Admitting: Internal Medicine

## 2020-04-05 ENCOUNTER — Encounter: Payer: Self-pay | Admitting: Internal Medicine

## 2020-04-05 VITALS — BP 118/80 | HR 78 | Temp 98.2°F | Ht 63.0 in | Wt 181.0 lb

## 2020-04-05 DIAGNOSIS — G629 Polyneuropathy, unspecified: Secondary | ICD-10-CM

## 2020-04-05 DIAGNOSIS — R197 Diarrhea, unspecified: Secondary | ICD-10-CM | POA: Diagnosis not present

## 2020-04-05 DIAGNOSIS — K589 Irritable bowel syndrome without diarrhea: Secondary | ICD-10-CM | POA: Diagnosis not present

## 2020-04-05 DIAGNOSIS — E559 Vitamin D deficiency, unspecified: Secondary | ICD-10-CM

## 2020-04-05 DIAGNOSIS — R1084 Generalized abdominal pain: Secondary | ICD-10-CM

## 2020-04-05 DIAGNOSIS — I10 Essential (primary) hypertension: Secondary | ICD-10-CM

## 2020-04-05 DIAGNOSIS — K580 Irritable bowel syndrome with diarrhea: Secondary | ICD-10-CM

## 2020-04-05 DIAGNOSIS — M791 Myalgia, unspecified site: Secondary | ICD-10-CM | POA: Diagnosis not present

## 2020-04-05 DIAGNOSIS — G5600 Carpal tunnel syndrome, unspecified upper limb: Secondary | ICD-10-CM

## 2020-04-05 DIAGNOSIS — I152 Hypertension secondary to endocrine disorders: Secondary | ICD-10-CM | POA: Diagnosis not present

## 2020-04-05 DIAGNOSIS — K921 Melena: Secondary | ICD-10-CM | POA: Diagnosis not present

## 2020-04-05 DIAGNOSIS — G43909 Migraine, unspecified, not intractable, without status migrainosus: Secondary | ICD-10-CM

## 2020-04-05 DIAGNOSIS — E785 Hyperlipidemia, unspecified: Secondary | ICD-10-CM

## 2020-04-05 DIAGNOSIS — E538 Deficiency of other specified B group vitamins: Secondary | ICD-10-CM

## 2020-04-05 DIAGNOSIS — Z23 Encounter for immunization: Secondary | ICD-10-CM | POA: Diagnosis not present

## 2020-04-05 DIAGNOSIS — Z1231 Encounter for screening mammogram for malignant neoplasm of breast: Secondary | ICD-10-CM

## 2020-04-05 DIAGNOSIS — E1142 Type 2 diabetes mellitus with diabetic polyneuropathy: Secondary | ICD-10-CM

## 2020-04-05 DIAGNOSIS — E1159 Type 2 diabetes mellitus with other circulatory complications: Secondary | ICD-10-CM

## 2020-04-05 DIAGNOSIS — K760 Fatty (change of) liver, not elsewhere classified: Secondary | ICD-10-CM

## 2020-04-05 DIAGNOSIS — R748 Abnormal levels of other serum enzymes: Secondary | ICD-10-CM

## 2020-04-05 HISTORY — DX: Carpal tunnel syndrome, unspecified upper limb: G56.00

## 2020-04-05 LAB — LIPID PANEL
Cholesterol: 142 mg/dL (ref 0–200)
HDL: 48.8 mg/dL (ref >39.00)
LDL Cholesterol: 59 mg/dL (ref 0–99)
NonHDL: 93.28
Total CHOL/HDL Ratio: 3
Triglycerides: 173 mg/dL — ABNORMAL HIGH (ref 0.0–149.0)
VLDL: 34.6 mg/dL (ref 0.0–40.0)

## 2020-04-05 LAB — CBC WITH DIFFERENTIAL/PLATELET
Basophils Absolute: 0.1 10*3/uL (ref 0.0–0.1)
Basophils Relative: 0.9 % (ref 0.0–3.0)
Eosinophils Absolute: 0.2 10*3/uL (ref 0.0–0.7)
Eosinophils Relative: 1.8 % (ref 0.0–5.0)
HCT: 41.1 % (ref 36.0–46.0)
Hemoglobin: 14 g/dL (ref 12.0–15.0)
Lymphocytes Relative: 29.4 % (ref 12.0–46.0)
Lymphs Abs: 3.2 10*3/uL (ref 0.7–4.0)
MCHC: 33.9 g/dL (ref 30.0–36.0)
MCV: 84.6 fl (ref 78.0–100.0)
Monocytes Absolute: 0.6 10*3/uL (ref 0.1–1.0)
Monocytes Relative: 5.5 % (ref 3.0–12.0)
Neutro Abs: 6.7 10*3/uL (ref 1.4–7.7)
Neutrophils Relative %: 62.4 % (ref 43.0–77.0)
Platelets: 321 10*3/uL (ref 150.0–400.0)
RBC: 4.86 Mil/uL (ref 3.87–5.11)
RDW: 14.1 % (ref 11.5–15.5)
WBC: 10.8 10*3/uL — ABNORMAL HIGH (ref 4.0–10.5)

## 2020-04-05 LAB — MICROALBUMIN / CREATININE URINE RATIO
Creatinine,U: 66.9 mg/dL
Microalb Creat Ratio: 1.1 mg/g (ref 0.0–30.0)
Microalb, Ur: 0.7 mg/dL (ref 0.0–1.9)

## 2020-04-05 LAB — COMPREHENSIVE METABOLIC PANEL
ALT: 42 U/L — ABNORMAL HIGH (ref 0–35)
AST: 38 U/L — ABNORMAL HIGH (ref 0–37)
Albumin: 4.3 g/dL (ref 3.5–5.2)
Alkaline Phosphatase: 96 U/L (ref 39–117)
BUN: 21 mg/dL (ref 6–23)
CO2: 31 mEq/L (ref 19–32)
Calcium: 9.9 mg/dL (ref 8.4–10.5)
Chloride: 100 mEq/L (ref 96–112)
Creatinine, Ser: 1.09 mg/dL (ref 0.40–1.20)
GFR: 55.28 mL/min — ABNORMAL LOW (ref >60.00)
Glucose, Bld: 149 mg/dL — ABNORMAL HIGH (ref 70–99)
Potassium: 3.2 mEq/L — ABNORMAL LOW (ref 3.5–5.1)
Sodium: 141 mEq/L (ref 135–145)
Total Bilirubin: 0.4 mg/dL (ref 0.2–1.2)
Total Protein: 7.8 g/dL (ref 6.0–8.3)

## 2020-04-05 LAB — VITAMIN D 25 HYDROXY (VIT D DEFICIENCY, FRACTURES): VITD: 31.66 ng/mL (ref 30.00–100.00)

## 2020-04-05 LAB — HEMOGLOBIN A1C: Hgb A1c MFr Bld: 8.3 % — ABNORMAL HIGH (ref 4.6–6.5)

## 2020-04-05 LAB — CK: Total CK: 86 U/L (ref 7–177)

## 2020-04-05 LAB — MAGNESIUM: Magnesium: 2 mg/dL (ref 1.5–2.5)

## 2020-04-05 LAB — TSH: TSH: 2.49 u[IU]/mL (ref 0.35–4.50)

## 2020-04-05 LAB — VITAMIN B12: Vitamin B-12: 383 pg/mL (ref 211–911)

## 2020-04-05 MED ORDER — CHOLESTYRAMINE 4 G PO PACK: 4.0000 g | PACK | Freq: Three times a day (TID) | ORAL | 0 refills | Status: DC

## 2020-04-05 MED ORDER — ROSUVASTATIN CALCIUM 20 MG PO TABS: 20.0000 mg | ORAL_TABLET | ORAL | 3 refills | Status: DC

## 2020-04-05 MED ORDER — DICYCLOMINE HCL 10 MG PO CAPS: 10.0000 mg | ORAL_CAPSULE | Freq: Three times a day (TID) | ORAL | 11 refills | Status: DC

## 2020-04-05 NOTE — Progress Notes (Signed)
Chief Complaint  Patient presents with  . Follow-up   F/u with multiple complaints  1. C/o numbness/tingling in lower legs wants NCS established with Dr. Manuella Ghazi and Dr. Jaynee Eagles but Dr. Manuella Ghazi did last NCS upper ext 12/05/19 + CTS still c/o tingling in hands and muscle spasms and pain  2. Adjustment d/o with depression tried therapy due to moms health is declining I.e metastatic breast cancer  3. C/o IBS-D with current sxs with diarrhea, generalized abdominal pain, blood stools with h/o hemorrhoids Will refill bentyl today 10 mg qid  Will refer back to GI Dr. Marius Ditch to further assess last colonoscopy and EGD 01/31/2019 neg Bxs EGD and tubular adenoma 4. DM 2 not taking metformin 500 mg bid check fasting labs today cmet, cbc, lipid, tsh,A1C vitamin D urine protein, mag, CK, B12 -agreeable to meet with catie pharm D BP controlled today on norvasc 5 mg qd and maxzide 37.5-25 mg qd   Review of Systems  Constitutional: Negative for weight loss.  HENT: Negative for hearing loss.   Eyes: Negative for blurred vision.  Respiratory: Negative for shortness of breath.   Cardiovascular: Negative for chest pain.  Gastrointestinal: Positive for abdominal pain, blood in stool and diarrhea.  Musculoskeletal: Positive for falls and myalgias.  Skin: Negative for rash.  Neurological: Negative for headaches.  Psychiatric/Behavioral: Positive for depression.   Past Medical History:  Diagnosis Date  . Allergy   . Arthritis   . Basal cell carcinoma    nose, left cheek, right upper lip, right posterior shoulder most recent 6 years ago on left cheek  . Chicken pox   . Complication of anesthesia    blood pressure elevated after gastric sleeve surgery in recovery, pt does not want mask over face  . COVID-19    03/01/20 had MAB 03/03/20 and felt better next day 03/04/20  . Depression   . Diabetes mellitus without complication ([place])    pre-diabetic currently, was diabetic prior to gastric sleeve surgery   . Disorder  of right rotator cuff    10/03/19 MRI right shoulder abnormal Dr. Maurine Cane  . Fatty liver   . Gout   . Hematuria 10/15/2018  . History of shingles   . Hyperlipidemia   . Hypertension    no problems since losing weight after gastric sleeve surgery  . Insomnia   . Migraine   . Obesity   . Osteopenia    Past Surgical History:  Procedure Laterality Date  . ABDOMINAL HYSTERECTOMY     no h/o abnormal pap still has 1 ovary left  . ANKLE ARTHROSCOPY Left 05/29/2016   Procedure: ANKLE ARTHROSCOPY;  Surgeon: [name], DPM;  Location: [place];  Service: Podiatry;  Laterality: Left;  . APPENDECTOMY    . basal cell removed    . [place]  . CHOLECYSTECTOMY    . COLONOSCOPY WITH PROPOFOL N/A 01/31/2019   Procedure: COLONOSCOPY WITH PROPOFOL;  Surgeon: Lin Landsman, MD;  Location: [facility] ENDOSCOPY;  Service: Gastroenterology;  Laterality: N/A;  . cyst removed from wrist    . ESOPHAGOGASTRODUODENOSCOPY (EGD) WITH PROPOFOL N/A 01/31/2019   Procedure: ESOPHAGOGASTRODUODENOSCOPY (EGD) WITH PROPOFOL;  Surgeon: Lin Landsman, MD;  Location: [place];  Service: Gastroenterology;  Laterality: N/A;  . FOOT SURGERY     left foot 11/2016 Dr. Laymond Purser   . left elbow surgery     07/2017 Dr Leanor Kail epicondylitis   . MOHS SURGERY  x 2 face nose and left cheek   . OSTEOCHONDROMA EXCISION Left 05/29/2016   Procedure: OSTEOCHONDRAL DRILLING TALUS;  Surgeon: [name], DPM;  Location: [place];  Service: Podiatry;  Laterality: Left;  . PLANTAR FASCIA RELEASE Left 05/29/2016   Procedure: ENDOSCOPIC PLANTAR FASCIOTOMY;  Surgeon: [name], DPM;  Location: [place];  Service: Podiatry;  Laterality: Left;  . ROTATOR CUFF REPAIR     ? right likely shoulder  . STOMACH SURGERY  2017   gastric sleeve surgery  . TENNIS ELBOW RELEASE/NIRSCHEL PROCEDURE Left 08/05/2017   Procedure: TENNIS ELBOW RELEASE/NIRSCHEL PROCEDURE;  Surgeon: Leanor Kail, MD;   Location: [facility];  Service: Orthopedics;  Laterality: Left;  . TUBAL LIGATION    . wedge resection of ovary  1982   Family History  Problem Relation Age of Onset  . Heart disease Mother   . Breast cancer Mother 73       metastatic, now hip, spine, and leg   . Arthritis Mother   . Hyperlipidemia Mother   . Cancer Mother        spinal surgery. breast dx'ed age 71 now metastatic 35 as of 07/13/18  . Cystic fibrosis Mother        carrier   . Heart disease Father   . Lung cancer Father        smoker  . Hyperlipidemia Father   . Hypertension Father   . Diabetes Sister   . Stroke Sister   . Other Sister        covid +  . Diabetes Maternal Aunt   . Diabetes Maternal Uncle   . Diabetes Maternal Grandfather   . Hyperlipidemia Maternal Grandfather   . Arthritis Maternal Grandmother   . Sudden death Cousin   . Diabetes Maternal Aunt   . Diabetes Sister   . Cystic fibrosis Other        great niece   . Colon cancer Neg Hx   . Ovarian cancer Neg Hx    Social History   Socioeconomic History  . Marital status: Married    Spouse name: Not on file  . Number of children: 3  . Years of education: Not on file  . Highest education level: Some college, no degree  Occupational History  . Not on file  Tobacco Use  . Smoking status: Never Smoker  . Smokeless tobacco: Never Used  Vaping Use  . Vaping Use: Never used  Substance and Sexual Activity  . Alcohol use: Never  . Drug use: Never  . Sexual activity: Yes    Partners: Male    Birth control/protection: Surgical  Other Topics Concern  . Not on file  Social History Narrative   Married x 37 years as of 10/2017    Kids 3 boys youngest is Eddie Dibbles   Write childrens books    Retired from [employer] 01/2017    Enjoys traveling    Right handed   Caffeine: quit 2009   Social Determinants of Health   Financial Resource Strain: Not on file  Food Insecurity: Not on file  Transportation Needs: Not on file  Physical Activity: Not on file   Stress: Not on file  Social Connections: Not on file  Intimate Partner Violence: Not on file   Current Meds  Medication Sig  . amitriptyline (ELAVIL) 25 MG tablet TAKE 1 TABLET (25 MG TOTAL) BY MOUTH AT BEDTIME. INCREASE TO $RemoveBef'50MG'eZFdBUJEne$  AFTER 2 WEEKS IF NEEDED  . amLODipine (NORVASC) 5 MG tablet Take  1 tablet (5 mg total) by mouth daily. And stop 2.5 mg daily (2 pills)  . fexofenadine (ALLEGRA) 180 MG tablet Take 1 tablet (180 mg total) by mouth daily as needed for allergies or rhinitis.  . metFORMIN (GLUCOPHAGE) 500 MG tablet Take 1 tablet (500 mg total) by mouth 2 (two) times daily with a meal.  . metroNIDAZOLE (METROCREAM) 0.75 % cream Apply to cheeks and nose every night for rosacea.  . montelukast (SINGULAIR) 10 MG tablet Take 1 tablet (10 mg total) by mouth at bedtime.  Marland Kitchen omeprazole (PRILOSEC) 40 MG capsule TAKE 1 CAPSULE (40 MG TOTAL) BY MOUTH DAILY BEFORE SUPPER.  . Rimegepant Sulfate (NURTEC) 75 MG TBDP Take 75 mg by mouth daily as needed (take for abortive therapy of migraine, no more than 1 tablet in 24 hours or 10 per month).  . topiramate (TOPAMAX) 50 MG tablet Take 1 tablet (50 mg total) by mouth 2 (two) times daily.  Marland Kitchen zolpidem (AMBIEN CR) 12.5 MG CR tablet Take 1 tablet (12.5 mg total) by mouth at bedtime as needed.  . [DISCONTINUED] cholestyramine (QUESTRAN) 4 g packet Take 1 packet (4 g total) by mouth 3 (three) times daily with meals.  . [DISCONTINUED] dicyclomine (BENTYL) 10 MG capsule TAKE 1 CAPSULE (10 MG TOTAL) BY MOUTH 3 (THREE) TIMES DAILY BETWEEN MEALS AS NEEDED FOR SPASMS.  . [DISCONTINUED] hydrOXYzine (ATARAX/VISTARIL) 25 MG tablet Take 1 tablet (25 mg total) by mouth 2 (two) times daily as needed.  . [DISCONTINUED] rosuvastatin (CRESTOR) 20 MG tablet Take 1 tablet (20 mg total) by mouth at bedtime.  . [DISCONTINUED] triamterene-hydrochlorothiazide (MAXZIDE-25) 37.5-25 MG tablet Take 1 tablet by mouth daily. In the am   Allergies  Allergen Reactions  . Tetracyclines &  Related Swelling and Rash    FACE SWELL, RASH ON CHEST  . Amoxicillin   . Alcohol Rash    ABDOMINAL CRAMPS, drinking alcohol  . Azithromycin Rash  . Metoprolol Diarrhea   Recent Results (from the past 2160 hour(s))  Novel Coronavirus, NAA ([facility])     Status: Abnormal   Collection Time: 03/01/20  3:34 PM   Specimen: Nasal Swab; Nasopharyngeal(NP) swabs in vial transport medium   Nasopharynge  Result Value Ref Range   SARS-CoV-2, NAA Detected (A) Not Detected    Comment: Patients who have a positive COVID-19 test result may now have treatment options. Treatment options are available for patients with mild to moderate symptoms and for hospitalized patients. Visit our website at [email or web address] for resources and information. This nucleic acid amplification test was developed and its performance characteristics determined by [employer]. Nucleic acid amplification tests include RT-PCR and TMA. This test has not been FDA cleared or approved. This test has been authorized by FDA under an Emergency Use Authorization (EUA). This test is only authorized for the duration of time the declaration that circumstances exist justifying the authorization of the emergency use of in vitro diagnostic tests for detection of SARS-CoV-2 virus and/or diagnosis of COVID-19 infection under section 564(b)(1) of the Act, 21 U.S.C. 176HYW-7(P) (1), unless the authorization is terminated or revoked sooner. When diagnostic testing is negativ e, the possibility of a false negative result should be considered in the context of a patient's recent exposures and the presence of clinical signs and symptoms consistent with COVID-19. An individual without symptoms of COVID-19 and who is not shedding SARS-CoV-2 virus would expect to have a negative (not detected) result in this assay.   SARS-COV-2, NAA 2 DAY TAT  Status: None   Collection Time: 03/01/20  3:34 PM   Nasopharynge  Result  Value Ref Range   SARS-CoV-2, NAA 2 DAY TAT Performed   Comprehensive metabolic panel     Status: Abnormal   Collection Time: 04/05/20 12:05 PM  Result Value Ref Range   Sodium 141 135 - 145 mEq/L   Potassium 3.2 (L) 3.5 - 5.1 mEq/L   Chloride 100 96 - 112 mEq/L   CO2 31 19 - 32 mEq/L   Glucose, Bld 149 (H) 70 - 99 mg/dL   BUN 21 6 - 23 mg/dL   Creatinine, Ser 1.09 0.40 - 1.20 mg/dL   Total Bilirubin 0.4 0.2 - 1.2 mg/dL   Alkaline Phosphatase 96 39 - 117 U/L   AST 38 (H) 0 - 37 U/L   ALT 42 (H) 0 - 35 U/L   Total Protein 7.8 6.0 - 8.3 g/dL   Albumin 4.3 3.5 - 5.2 g/dL   GFR 55.28 (L) >60.00 mL/min    Comment: Calculated using the CKD-EPI Creatinine Equation (2021)   Calcium 9.9 8.4 - 10.5 mg/dL  Lipid panel     Status: Abnormal   Collection Time: 04/05/20 12:05 PM  Result Value Ref Range   Cholesterol 142 0 - 200 mg/dL    Comment: ATP III Classification       Desirable:  < 200 mg/dL               Borderline High:  200 - 239 mg/dL          High:  > = 240 mg/dL   Triglycerides 173.0 (H) 0.0 - 149.0 mg/dL    Comment: Normal:  <150 mg/dLBorderline High:  150 - 199 mg/dL   HDL 48.80 >39.00 mg/dL   VLDL 34.6 0.0 - 40.0 mg/dL   LDL Cholesterol 59 0 - 99 mg/dL   Total CHOL/HDL Ratio 3     Comment:                Men          Women1/2 Average Risk     3.4          3.3Average Risk          5.0          4.42X Average Risk          9.6          7.13X Average Risk          15.0          11.0                       NonHDL 93.28     Comment: NOTE:  Non-HDL goal should be 30 mg/dL higher than patient's LDL goal (i.e. LDL goal of < 70 mg/dL, would have non-HDL goal of < 100 mg/dL)  CBC with Differential/Platelet     Status: Abnormal   Collection Time: 04/05/20 12:05 PM  Result Value Ref Range   WBC 10.8 (H) 4.0 - 10.5 K/uL   RBC 4.86 3.87 - 5.11 Mil/uL   Hemoglobin 14.0 12.0 - 15.0 g/dL   HCT 41.1 36.0 - 46.0 %   MCV 84.6 78.0 - 100.0 fl   MCHC 33.9 30.0 - 36.0 g/dL   RDW 14.1 11.5 - 15.5 %    Platelets 321.0 150.0 - 400.0 K/uL   Neutrophils Relative % 62.4 43.0 - 77.0 %   Lymphocytes Relative 29.4 12.0 - 46.0 %  Monocytes Relative 5.5 3.0 - 12.0 %   Eosinophils Relative 1.8 0.0 - 5.0 %   Basophils Relative 0.9 0.0 - 3.0 %   Neutro Abs 6.7 1.4 - 7.7 K/uL   Lymphs Abs 3.2 0.7 - 4.0 K/uL   Monocytes Absolute 0.6 0.1 - 1.0 K/uL   Eosinophils Absolute 0.2 0.0 - 0.7 K/uL   Basophils Absolute 0.1 0.0 - 0.1 K/uL  TSH     Status: None   Collection Time: 04/05/20 12:05 PM  Result Value Ref Range   TSH 2.49 0.35 - 4.50 uIU/mL  Hemoglobin A1c     Status: Abnormal   Collection Time: 04/05/20 12:05 PM  Result Value Ref Range   Hgb A1c MFr Bld 8.3 (H) 4.6 - 6.5 %    Comment: Glycemic Control Guidelines for People with Diabetes:Non Diabetic:  <6%Goal of Therapy: <7%Additional Action Suggested:  >8%   B12     Status: None   Collection Time: 04/05/20 12:05 PM  Result Value Ref Range   Vitamin B-12 383 211 - 911 pg/mL  Vitamin D (25 hydroxy)     Status: None   Collection Time: 04/05/20 12:05 PM  Result Value Ref Range   VITD 31.66 30.00 - 100.00 ng/mL  Magnesium     Status: None   Collection Time: 04/05/20 12:05 PM  Result Value Ref Range   Magnesium 2.0 1.5 - 2.5 mg/dL  CK     Status: None   Collection Time: 04/05/20 12:05 PM  Result Value Ref Range   Total CK 86 7 - 177 U/L  Microalbumin / creatinine urine ratio     Status: None   Collection Time: 04/05/20 12:06 PM  Result Value Ref Range   Microalb, Ur 0.7 0.0 - 1.9 mg/dL   Creatinine,U 66.9 mg/dL   Microalb Creat Ratio 1.1 0.0 - 30.0 mg/g  Comprehensive metabolic panel     Status: Abnormal   Collection Time: 04/23/20  2:26 PM  Result Value Ref Range   Sodium 140 135 - 145 mEq/L   Potassium 3.1 (L) 3.5 - 5.1 mEq/L   Chloride 102 96 - 112 mEq/L   CO2 28 19 - 32 mEq/L   Glucose, Bld 191 (H) 70 - 99 mg/dL   BUN 23 6 - 23 mg/dL   Creatinine, Ser 1.04 0.40 - 1.20 mg/dL   Total Bilirubin 0.3 0.2 - 1.2 mg/dL   Alkaline  Phosphatase 91 39 - 117 U/L   AST 42 (H) 0 - 37 U/L   ALT 51 (H) 0 - 35 U/L   Total Protein 7.3 6.0 - 8.3 g/dL   Albumin 4.5 3.5 - 5.2 g/dL   GFR 58.46 (L) >60.00 mL/min    Comment: Calculated using the CKD-EPI Creatinine Equation (2021)   Calcium 10.2 8.4 - 10.5 mg/dL  CBC with Differential/Platelet     Status: None   Collection Time: 04/23/20  2:26 PM  Result Value Ref Range   WBC 9.6 4.0 - 10.5 K/uL   RBC 4.58 3.87 - 5.11 Mil/uL   Hemoglobin 13.0 12.0 - 15.0 g/dL   HCT 39.0 36.0 - 46.0 %   MCV 85.1 78.0 - 100.0 fl   MCHC 33.3 30.0 - 36.0 g/dL   RDW 14.4 11.5 - 15.5 %   Platelets 295.0 150.0 - 400.0 K/uL   Neutrophils Relative % 59.3 43.0 - 77.0 %   Lymphocytes Relative 31.5 12.0 - 46.0 %   Monocytes Relative 6.3 3.0 - 12.0 %   Eosinophils Relative  2.0 0.0 - 5.0 %   Basophils Relative 0.9 0.0 - 3.0 %   Neutro Abs 5.7 1.4 - 7.7 K/uL   Lymphs Abs 3.0 0.7 - 4.0 K/uL   Monocytes Absolute 0.6 0.1 - 1.0 K/uL   Eosinophils Absolute 0.2 0.0 - 0.7 K/uL   Basophils Absolute 0.1 0.0 - 0.1 K/uL  Pathologist smear review     Status: Abnormal   Collection Time: 04/23/20  2:26 PM  Result Value Ref Range   Path Rev WBC Appear normal.    Path Rev RBC Appear normal.    Path Rev PLTs Appear normal.    PATH INTERP BLD-IMP Comment     Comment: No pathologic diagnosis.   PATHOLOGIST NAME Comment     Comment: Reviewed by:  Davonna Belling, MD, Pathologist   WBC 9.7 3.4 - 10.8 x10E3/uL   RBC 4.71 3.77 - 5.28 x10E6/uL   Hemoglobin 13.3 11.1 - 15.9 g/dL   Hematocrit 40.3 34.0 - 46.6 %   MCV 86 79 - 97 fL   MCH 28.2 26.6 - 33.0 pg   MCHC 33.0 31.5 - 35.7 g/dL   RDW 13.6 11.7 - 15.4 %   Platelets 302 150 - 450 x10E3/uL   Neutrophils 57 Not Estab. %   Lymphs 33 Not Estab. %   Monocytes 7 Not Estab. %   Eos 2 Not Estab. %   Basos 1 Not Estab. %   Neutrophils Absolute 5.5 1.4 - 7.0 x10E3/uL   Lymphocytes Absolute 3.2 (H) 0.7 - 3.1 x10E3/uL   Monocytes Absolute 0.7 0.1 - 0.9 x10E3/uL   EOS  (ABSOLUTE) 0.2 0.0 - 0.4 x10E3/uL   Basophils Absolute 0.1 0.0 - 0.2 x10E3/uL   Immature Granulocytes 0 Not Estab. %   Immature Grans (Abs) 0.0 0.0 - 0.1 x10E3/uL   Objective  Body mass index is 32.06 kg/m. Wt Readings from Last 3 Encounters:  04/05/20 181 lb (82.1 kg)  03/28/20 180 lb (81.6 kg)  08/19/19 186 lb 9.6 oz (84.6 kg)   Temp Readings from Last 3 Encounters:  04/05/20 98.2 F (36.8 C) (Oral)  03/03/20 98.1 F (36.7 C) (Oral)  08/19/19 97.7 F (36.5 C) (Oral)   BP Readings from Last 3 Encounters:  04/05/20 118/80  03/28/20 140/85  03/03/20 (!) 168/84   Pulse Readings from Last 3 Encounters:  04/05/20 78  03/28/20 93  03/03/20 71    Physical Exam Vitals and nursing note reviewed.  Constitutional:      Appearance: Normal appearance. She is well-developed and well-groomed. She is obese.  HENT:     Head: Normocephalic and atraumatic.  Eyes:     Conjunctiva/sclera: Conjunctivae normal.     Pupils: Pupils are equal, round, and reactive to light.  Cardiovascular:     Rate and Rhythm: Normal rate and regular rhythm.     Heart sounds: Normal heart sounds. No murmur heard.   Pulmonary:     Effort: Pulmonary effort is normal.     Breath sounds: Normal breath sounds.  Abdominal:     General: Abdomen is flat. Bowel sounds are normal.     Tenderness: There is generalized abdominal tenderness.  Skin:    General: Skin is warm and dry.  Neurological:     General: No focal deficit present.     Mental Status: She is alert and oriented to person, place, and time. Mental status is at baseline.     Gait: Gait normal.  Psychiatric:        Attention  and Perception: Attention and perception normal.        Mood and Affect: Mood and affect normal.        Speech: Speech normal.        Behavior: Behavior normal. Behavior is cooperative.        Thought Content: Thought content normal.        Cognition and Memory: Cognition and memory normal.        Judgment: Judgment  normal.     Assessment  Plan  Abdominal pain with Blood in stool with h/o flare of IBS-D ? If due to stressors due to moms health decline other ddx GIB. She has h/o tubular adenoma noted colonoscopy 01/2019. Also with elevated lfts- Plan: CT Abdomen Pelvis W Contrast, Ambulatory referral to Gastroenterology Dr. 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 vs other +myalgias   A1C 04/05/20 8.3- Plan: TSH, Ambulatory referral to Neurology Dr/ Manuella Ghazi concern b/l lower NCS/EMG  Hypertension associated with diabetes ([place]) uncontrolled 04/05/20 8.3 A1C pt is not taking metformin 500 mg bid likely due to diarrhea ok to hold for now norvasc 5 mg qd  1/2 dose maxzide 37.25 + K 20 meq qd   - Plan: Comprehensive metabolic panel, Lipid panel, CBC with Differential/Platelet, Microalbumin / creatinine urine ratio, Hemoglobin A1c, AMB [facility] to [facility] Cat[facility] pharm D, triamterene-hydrochlorothiazide (MAXZIDE-25) 37.5-25 MG tablet cut maxzide in 1/2 pill due to elevated creatinine   B12 deficiency - Plan: B12 383 today   Vitamin D deficiency - Plan: Vitamin D (25 hydroxy) Normal 31.66 continue otc D3 qd   Carpal tunnel syndrome, left s/p TMC/lidocaine ing 11/2019 with Dr. Manuella Ghazi   Elevated liver enzymes with fatty liver noted [place] 03/05/11 - Plan: Ambulatory referral to Gastroenterology Dr. Marius Ditch  HM - bill CPE in future at f/u after 04/05/20 Flu shot utd9/29/21 or 10/21/19 utdTdap12/20/14 Disc shingrixprevstill not had covid vxhad 3/82moderna hep A vaccine2/2 04/05/20 will need to check titer in the future h/o fatty liver hep B immunewith h/o fatty liver Hep C neg MMRimmune  Never smoker, chew congratulatednot doing either  mammo6/15/21 b/l diagnormal right axillary mass fat pad likely MSK pain/ttp -screening mammo in 1 year ordered    Colonoscopy -obtained records 07/15/11 colonoscopy Dr. Candace Cruise nl repeat in 10 years  Colonoscopy 01/31/19 Dr. Marius Ditch IH prep fairhemmorhiods, tubular adenoma f/u in 5 years likely with GI  Pap s/p hysterectomyh.o fibroids? Cervix intact has appt Dr. Enzo Bi 02/10/17. -Appt went well1/2019 ob/gynf/u in 1 year  -per notepap not neededshe will f/u with Dr. Milus Banister seen 03/10/2018 pap 08/19/19 neg neg HPV  DEXA consider age 8  [place] h/o BCC nose and left cheek s/p Mohs and h/o Aks -seen 06/07/19 f/u 11/15/2021Dr. Brendolyn Patty  5/5/21[place] retinopathy   Healthy diet and exercise rec.  Provider: Dr. Olivia Mackie McLean-Scocuzza-Internal Medicine

## 2020-04-05 NOTE — Patient Instructions (Addendum)
Align probiotic Avoid dairy and gluten  immodium Cholestyramine as needed up to 3x per day for diarrhea take with meals  Food diary   crestor take every other day   Increase water 55-64 ounces daily    Alpha lipoic acid 600 mg 2x per day   Call back when ready and schedule shingrix   LAST HEP A SHOT!   Zoster Vaccine, Recombinant injection What is this medicine? ZOSTER VACCINE (ZOS ter vak SEEN) is a vaccine used to reduce the risk of getting shingles. This vaccine is not used to treat shingles or nerve pain from shingles. This medicine may be used for other purposes; ask your health care provider or pharmacist if you have questions. COMMON BRAND NAME(S): [facility] What should I tell my health care provider before I take this medicine? They need to know if you have any of these conditions:  cancer  immune system problems  an unusual or allergic reaction to Zoster vaccine, other medications, foods, dyes, or preservatives  pregnant or trying to get pregnant  breast-feeding How should I use this medicine? This vaccine is injected into a muscle. It is given by a health care provider. A copy of Vaccine Information Statements will be given before each vaccination. Be sure to read this information carefully each time. This sheet may change often. Talk to your health care provider about the use of this vaccine in children. This vaccine is not approved for use in children. Overdosage: If you think you have taken too much of this medicine contact a poison control center or emergency room at once. NOTE: This medicine is only for you. Do not share this medicine with others. What if I miss a dose? Keep appointments for follow-up (booster) doses. It is important not to miss your dose. Call your health care provider if you are unable to keep an appointment. What may interact with this medicine?  medicines that suppress your immune system  medicines to treat cancer  steroid medicines  like prednisone or cortisone This list may not describe all possible interactions. Give your health care provider a list of all the medicines, herbs, non-prescription drugs, or dietary supplements you use. Also tell them if you smoke, drink alcohol, or use illegal drugs. Some items may interact with your medicine. What should I watch for while using this medicine? Visit your health care provider regularly. This vaccine, like all vaccines, may not fully protect everyone. What side effects may I notice from receiving this medicine? Side effects that you should report to your doctor or health care professional as soon as possible:  allergic reactions (skin rash, itching or hives; swelling of the face, lips, or tongue)  trouble breathing Side effects that usually do not require medical attention (report these to your doctor or health care professional if they continue or are bothersome):  chills  headache  fever  nausea  pain, redness, or irritation at site where injected  tiredness  vomiting This list may not describe all possible side effects. Call your doctor for medical advice about side effects. You may report side effects to FDA at 1-800-FDA-1088. Where should I keep my medicine? This vaccine is only given by a health care provider. It will not be stored at home. NOTE: This sheet is a summary. It may not cover all possible information. If you have questions about this medicine, talk to your doctor, pharmacist, or health care provider.  2021 Elsevier/Gold Standard (2019-02-11 16:23:07)  Peripheral Neuropathy Peripheral neuropathy is a type  of nerve damage. It affects nerves that carry signals between the spinal cord and the arms, legs, and the rest of the body (peripheral nerves). It does not affect nerves in the spinal cord or brain. In peripheral neuropathy, one nerve or a group of nerves may be damaged. Peripheral neuropathy is a broad category that includes many specific nerve  disorders, like diabetic neuropathy, hereditary neuropathy, and carpal tunnel syndrome. What are the causes? This condition may be caused by:  Diabetes. This is the most common cause of peripheral neuropathy.  Nerve injury.  Pressure or stress on a nerve that lasts a long time.  Lack (deficiency) of B vitamins. This can result from alcoholism, poor diet, or a restricted diet.  Infections.  Autoimmune diseases, such as rheumatoid arthritis and systemic lupus erythematosus.  Nerve diseases that are passed from parent to child (inherited).  Some medicines, such as cancer medicines (chemotherapy).  Poisonous (toxic) substances, such as lead and mercury.  Too little blood flowing to the legs.  Kidney disease.  Thyroid disease. In some cases, the cause of this condition is not known. What are the signs or symptoms? Symptoms of this condition depend on which of your nerves is damaged. Common symptoms include:  Loss of feeling (numbness) in the feet, hands, or both.  Tingling in the feet, hands, or both.  Burning pain.  Very sensitive skin.  Weakness.  Not being able to move a part of the body (paralysis).  Muscle twitching.  Clumsiness or poor coordination.  Loss of balance.  Not being able to control your bladder.  Feeling dizzy.  Sexual problems. How is this diagnosed? Diagnosing and finding the cause of peripheral neuropathy can be difficult. Your health care provider will take your medical history and do a physical exam. A neurological exam will also be done. This involves checking things that are affected by your brain, spinal cord, and nerves (nervous system). For example, your health care provider will check your reflexes, how you move, and what you can feel. You may have other tests, such as:  Blood tests.  Electromyogram (EMG) and nerve conduction tests. These tests check nerve function and how well the nerves are controlling the muscles.  Imaging  tests, such as CT scans or MRI to rule out other causes of your symptoms.  Removing a small piece of nerve to be examined in a lab (nerve biopsy).  Removing and examining a small amount of the fluid that surrounds the brain and spinal cord (lumbar puncture). How is this treated? Treatment for this condition may involve:  Treating the underlying cause of the neuropathy, such as diabetes, kidney disease, or vitamin deficiencies.  Stopping medicines that can cause neuropathy, such as chemotherapy.  Medicine to help relieve pain. Medicines may include: ? Prescription or over-the-counter pain medicine. ? Antiseizure medicine. ? Antidepressants. ? Pain-relieving patches that are applied to painful areas of skin.  Surgery to relieve pressure on a nerve or to destroy a nerve that is causing pain.  Physical therapy to help improve movement and balance.  Devices to help you move around (assistive devices). Follow these instructions at home: Medicines  Take over-the-counter and prescription medicines only as told by your health care provider. Do not take any other medicines without first asking your health care provider.  Do not drive or use heavy machinery while taking prescription pain medicine. Lifestyle  Do not use any products that contain nicotine or tobacco, such as cigarettes and e-cigarettes. Smoking keeps blood from reaching  damaged nerves. If you need help quitting, ask your health care provider.  Avoid or limit alcohol. Too much alcohol can cause a vitamin B deficiency, and vitamin B is needed for healthy nerves.  Eat a healthy diet. This includes: ? Eating foods that are high in fiber, such as fresh fruits and vegetables, whole grains, and beans. ? Limiting foods that are high in fat and processed sugars, such as fried or sweet foods.   General instructions  If you have diabetes, work closely with your health care provider to keep your blood sugar under control.  If you  have numbness in your feet: ? Check every day for signs of injury or infection. Watch for redness, warmth, and swelling. ? Wear padded socks and comfortable shoes. These help protect your feet.  Develop a good support system. Living with peripheral neuropathy can be stressful. Consider talking with a mental health specialist or joining a support group.  Use assistive devices and attend physical therapy as told by your health care provider. This may include using a walker or a cane.  Keep all follow-up visits as told by your health care provider. This is important.   Contact a health care provider if:  You have new signs or symptoms of peripheral neuropathy.  You are struggling emotionally from dealing with peripheral neuropathy.  Your pain is not well-controlled. Get help right away if:  You have an injury or infection that is not healing normally.  You develop new weakness in an arm or leg.  You have fallen or do so frequently. Summary  Peripheral neuropathy is when the nerves in the arms, or legs are damaged, resulting in numbness, weakness, or pain.  There are many causes of peripheral neuropathy, including diabetes, pinched nerves, vitamin deficiencies, autoimmune disease, and hereditary conditions.  Diagnosing and finding the cause of peripheral neuropathy can be difficult. Your health care provider will take your medical history, do a physical exam, and do tests, including blood tests and nerve function tests.  Treatment involves treating the underlying cause of the neuropathy and taking medicines to help control pain. Physical therapy and assistive devices may also help. This information is not intended to replace advice given to you by your health care provider. Make sure you discuss any questions you have with your health care provider. Document Revised: 10/18/2019 Document Reviewed: 10/18/2019 Elsevier Patient Education  2021 [place].  Neuropathic Pain Neuropathic  pain is pain caused by damage to the nerves that are responsible for certain sensations in your body (sensory nerves). The pain can be caused by:  Damage to the sensory nerves that send signals to your spinal cord and brain (peripheral nervous system).  Damage to the sensory nerves in your brain or spinal cord (central nervous system). Neuropathic pain can make you more sensitive to pain. Even a minor sensation can feel very painful. This is usually a long-term condition that can be difficult to treat. The type of pain differs from person to person. It may:  Start suddenly (acute), or it may develop slowly and last for a long time (chronic).  Come and go as damaged nerves heal, or it may stay at the same level for years.  Cause emotional distress, loss of sleep, and a lower quality of life. What are the causes? The most common cause of this condition is diabetes. Many other diseases and conditions can also cause neuropathic pain. Causes of neuropathic pain can be classified as:  Toxic. This is caused by medicines  and chemicals. The most common cause of toxic neuropathic pain is damage from cancer treatments (chemotherapy).  Metabolic. This can be caused by: ? Diabetes. This is the most common disease that damages the nerves. ? Lack of vitamin B from long-term alcohol abuse.  Traumatic. Any injury that cuts, crushes, or stretches a nerve can cause damage and pain. A common example is feeling pain after losing an arm or leg (phantom limb pain).  Compression-related. If a sensory nerve gets trapped or compressed for a long period of time, the blood supply to the nerve can be cut off.  Vascular. Many blood vessel diseases can cause neuropathic pain by decreasing blood supply and oxygen to nerves.  Autoimmune. This type of pain results from diseases in which the body's defense system (immune system) mistakenly attacks sensory nerves. Examples of autoimmune diseases that can cause neuropathic  pain include lupus and multiple sclerosis.  Infectious. Many types of viral infections can damage sensory nerves and cause pain. Shingles infection is a common cause of this type of pain.  Inherited. Neuropathic pain can be a symptom of many diseases that are passed down through families (genetic). What increases the risk? You are more likely to develop this condition if:  You have diabetes.  You smoke.  You drink too much alcohol.  You are taking certain medicines, including medicines that kill cancer cells (chemotherapy) or that treat immune system disorders. What are the signs or symptoms? The main symptom is pain. Neuropathic pain is often described as:  Burning.  Shock-like.  Stinging.  Hot or cold.  Itching. How is this diagnosed? No single test can diagnose neuropathic pain. It is diagnosed based on:  Physical exam and your symptoms. Your health care provider will ask you about your pain. You may be asked to use a pain scale to describe how bad your pain is.  Tests. These may be done to see if you have a high sensitivity to pain and to help find the cause and location of any sensory nerve damage. They include: ? Nerve conduction studies to test how well nerve signals travel through your sensory nerves (electrodiagnostic testing). ? Stimulating your sensory nerves through electrodes on your skin and measuring the response in your spinal cord and brain (somatosensory evoked potential).  Imaging studies, such as: ? X-rays. ? CT scan. ? MRI. How is this treated? Treatment for neuropathic pain may change over time. You may need to try different treatment options or a combination of treatments. Some options include:  Treating the underlying cause of the neuropathy, such as diabetes, kidney disease, or vitamin deficiencies.  Stopping medicines that can cause neuropathy, such as chemotherapy.  Medicine to relieve pain. Medicines may include: ? Prescription or  over-the-counter pain medicine. ? Anti-seizure medicine. ? Antidepressant medicines. ? Pain-relieving patches that are applied to painful areas of skin. ? A medicine to numb the area (local anesthetic), which can be injected as a nerve block.  Transcutaneous nerve stimulation. This uses electrical currents to block painful nerve signals. The treatment is painless.  Alternative treatments, such as: ? Acupuncture. ? Meditation. ? Massage. ? Physical therapy. ? Pain management programs. ? Counseling. Follow these instructions at home: Medicines  Take over-the-counter and prescription medicines only as told by your health care provider.  Do not drive or use heavy machinery while taking prescription pain medicine.  If you are taking prescription pain medicine, take actions to prevent or treat constipation. Your health care provider may recommend that you: ?  Drink enough fluid to keep your urine pale yellow. ? Eat foods that are high in fiber, such as fresh fruits and vegetables, whole grains, and beans. ? Limit foods that are high in fat and processed sugars, such as fried or sweet foods. ? Take an over-the-counter or prescription medicine for constipation.   Lifestyle  Have a good support system at home.  Consider joining a chronic pain support group.  Do not use any products that contain nicotine or tobacco, such as cigarettes and e-cigarettes. If you need help quitting, ask your health care provider.  Do not drink alcohol.   General instructions  Learn as much as you can about your condition.  Work closely with all your health care providers to find the treatment plan that works best for you.  Ask your health care provider what activities are safe for you.  Keep all follow-up visits as told by your health care provider. This is important. Contact a health care provider if:  Your pain treatments are not working.  You are having side effects from your medicines.  You  are struggling with tiredness (fatigue), mood changes, depression, or anxiety. Summary  Neuropathic pain is pain caused by damage to the nerves that are responsible for certain sensations in your body (sensory nerves).  Neuropathic pain may come and go as damaged nerves heal, or it may stay at the same level for years.  Neuropathic pain is usually a long-term condition that can be difficult to treat. Consider joining a chronic pain support group. This information is not intended to replace advice given to you by your health care provider. Make sure you discuss any questions you have with your health care provider. Document Revised: 04/29/2018 Document Reviewed: 01/23/2017 Elsevier Patient Education  2021 [place].   Irritable Bowel Syndrome, Adult  Irritable bowel syndrome (IBS) is a group of symptoms that affects the organs responsible for digestion (gastrointestinal or GI tract). IBS is not one specific disease. To regulate how the GI tract works, the body sends signals back and forth between the intestines and the brain. If you have IBS, there may be a problem with these signals. As a result, the GI tract does not function normally. The intestines may become more sensitive and overreact to certain things. This may be especially true when you eat certain foods or when you are under stress. There are four types of IBS. These may be determined based on the consistency of your stool (feces):  IBS with diarrhea.  IBS with constipation.  Mixed IBS.  Unsubtyped IBS. It is important to know which type of IBS you have. Certain treatments are more likely to be helpful for certain types of IBS. What are the causes? The exact cause of IBS is not known. What increases the risk? You may have a higher risk for IBS if you:  Are female.  Are younger than 23.  Have a family history of IBS.  Have a mental health condition, such as depression, anxiety, or post-traumatic stress  disorder.  Have had a bacterial infection of your GI tract. What are the signs or symptoms? Symptoms of IBS vary from person to person. The main symptom is abdominal pain or discomfort. Other symptoms usually include one or more of the following:  Diarrhea, constipation, or both.  Abdominal swelling or bloating.  Feeling full after eating a small or regular-sized meal.  Frequent gas.  Mucus in the stool.  A feeling of having more stool left after a bowel  movement. Symptoms tend to come and go. They may be triggered by stress, mental health conditions, or certain foods. How is this diagnosed? This condition may be diagnosed based on a physical exam, your medical history, and your symptoms. You may have tests, such as:  Blood tests.  Stool test.  X-rays.  CT scan.  Colonoscopy. This is a procedure in which your GI tract is viewed with a long, thin, flexible tube. How is this treated? There is no cure for IBS, but treatment can help relieve symptoms. Treatment depends on the type of IBS you have, and may include:  Changes to your diet, such as: ? Avoiding foods that cause symptoms. ? Drinking more water. ? Following a low-FODMAP (fermentable oligosaccharides, disaccharides, monosaccharides, and polyols) diet for up to 6 weeks, or as told by your health care provider. FODMAPs are sugars that are hard for some people to digest. ? Eating more fiber. ? Eating medium-sized meals at the same times every day.  Medicines. These may include: ? Fiber supplements, if you have constipation. ? Medicine to control diarrhea (antidiarrheal medicines). ? Medicine to help control muscle tightening (spasms) in your GI tract (antispasmodic medicines). ? Medicines to help with mental health conditions, such as antidepressants or tranquilizers.  Talk therapy or counseling.  Working with a diet and nutrition specialist (dietitian) to help create a food plan that is right for you.  Managing  your stress. Follow these instructions at home: Eating and drinking  Eat a healthy diet.  Eat medium-sized meals at about the same time every day. Do not eat large meals.  Gradually eat more fiber-rich foods. These include whole grains, fruits, and vegetables. This may be especially helpful if you have IBS with constipation.  Eat a diet low in FODMAPs.  Drink enough fluid to keep your urine pale yellow.  Keep a journal of foods that seem to trigger symptoms.  Avoid foods and drinks that: ? Contain added sugar. ? Make your symptoms worse. Dairy products, caffeinated drinks, and carbonated drinks can make symptoms worse for some people. General instructions  Take over-the-counter and prescription medicines and supplements only as told by your health care provider.  Get enough exercise. Do at least 150 minutes of moderate-intensity exercise each week.  Manage your stress. Getting enough sleep and exercise can help you manage stress.  Keep all follow-up visits as told by your health care provider and therapist. This is important. Alcohol Use  Do not drink alcohol if: ? Your health care provider tells you not to drink. ? You are pregnant, may be pregnant, or are planning to become pregnant.  If you drink alcohol, limit how much you have: ? 0-1 drink a day for women. ? 0-2 drinks a day for men.  Be aware of how much alcohol is in your drink. In the [place] one drink equals one typical bottle of beer (12 oz), one-half glass of wine (5 oz), or one shot of hard liquor (1 oz). Contact a health care provider if you have:  Constant pain.  Weight loss.  Difficulty or pain when swallowing.  Diarrhea that gets worse. Get help right away if you have:  Severe abdominal pain.  Fever.  Diarrhea with symptoms of dehydration, such as dizziness or dry mouth.  Bright red blood in your stool.  Stool that is black and tarry.  Abdominal swelling.  Vomiting that does not  stop.  Blood in your vomit. Summary  Irritable bowel syndrome (IBS) is not one specific  disease. It is a group of symptoms that affects digestion.  Your intestines may become more sensitive and overreact to certain things. This may be especially true when you eat certain foods or when you are under stress.  There is no cure for IBS, but treatment can help relieve symptoms. This information is not intended to replace advice given to you by your health care provider. Make sure you discuss any questions you have with your health care provider. Document Revised: 09/08/2019 Document Reviewed: 09/08/2019 Elsevier Patient Education  [place].  Diet for Irritable Bowel Syndrome When you have irritable bowel syndrome (IBS), it is very important to eat the foods and follow the eating habits that are best for your condition. IBS may cause various symptoms such as pain in the abdomen, constipation, or diarrhea. Choosing the right foods can help to ease the discomfort from these symptoms. Work with your health care provider and diet and nutrition specialist (dietitian) to find the eating plan that will help to control your symptoms. What are tips for following this plan?  Keep a food diary. This will help you identify foods that cause symptoms. Write down: ? What you eat and when you eat it. ? What symptoms you have. ? When symptoms occur in relation to your meals, such as "pain in abdomen 2 hours after dinner."  Eat your meals slowly and in a relaxed setting.  Aim to eat 5-6 small meals per day. Do not skip meals.  Drink enough fluid to keep your urine pale yellow.  Ask your health care provider if you should take an over-the-counter probiotic to help restore healthy bacteria in your gut (digestive tract). ? Probiotics are foods that contain good bacteria and yeasts.  Your dietitian may have specific dietary recommendations for you based on your symptoms. He or she may recommend that  you: ? Avoid foods that cause symptoms. Talk with your dietitian about other ways to get the same nutrients that are in those problem foods. ? Avoid foods with gluten. Gluten is a protein that is found in rye, wheat, and barley. ? Eat more foods that contain soluble fiber. Examples of foods with high soluble fiber include oats, seeds, and certain fruits and vegetables. Take a fiber supplement if directed by your dietitian. ? Reduce or avoid certain foods called FODMAPs. These are foods that contain carbohydrates that are hard to digest. Ask your doctor which foods contain these carbohydrates.      What foods are not recommended? The following are some foods and drinks that may make your symptoms worse:  Fatty foods, such as french fries.  Foods that contain gluten, such as pasta and cereal.  Dairy products, such as milk, cheese, and ice cream.  Chocolate.  Alcohol.  Products with caffeine, such as coffee.  Carbonated drinks, such as soda.  Foods that are high in FODMAPs. These include certain fruits and vegetables.  Products with sweeteners such as honey, high fructose corn syrup, sorbitol, and [place]. The items listed above may not be a complete list of foods and beverages you should avoid. Contact a dietitian for more information.   What foods are good sources of fiber? Your health care provider or dietitian may recommend that you eat more foods that contain fiber. Fiber can help to reduce constipation and other IBS symptoms. Add foods with fiber to your diet a little at a time so your body can get used to them. Too much fiber at one time might cause gas  and swelling of your abdomen. The following are some foods that are good sources of fiber:  Berries, such as raspberries, strawberries, and blueberries.  Tomatoes.  Carrots.  Brown rice.  Oats.  Seeds, such as chia and pumpkin seeds. The items listed above may not be a complete list of recommended sources of fiber.  Contact your dietitian for more options. Where to find more information  International Foundation for Functional Gastrointestinal Disorders: www.iffgd.[employer] of Diabetes and Digestive and Kidney Diseases: [employer] Summary  When you have irritable bowel syndrome (IBS), it is very important to eat the foods and follow the eating habits that are best for your condition.  IBS may cause various symptoms such as pain in the abdomen, constipation, or diarrhea.  Choosing the right foods can help to ease the discomfort that comes from symptoms.  Keep a food diary. This will help you identify foods that cause symptoms.  Your health care provider or diet and nutrition specialist (dietitian) may recommend that you eat more foods that contain fiber. This information is not intended to replace advice given to you by your health care provider. Make sure you discuss any questions you have with your health care provider. Document Revised: 09/08/2019 Document Reviewed: 09/08/2019 Elsevier Patient Education  2021 [place] stands for fermentable oligosaccharides, disaccharides, monosaccharides, and polyols. These are sugars that are hard for some people to digest. A low-FODMAP eating plan may help some people who have irritable bowel syndrome (IBS) and certain other bowel (intestinal) diseases to manage their symptoms. This meal plan can be complicated to follow. Work with a diet and nutrition specialist (dietitian) to make a low-FODMAP eating plan that is right for you. A dietitian can help make sure that you get enough nutrition from this diet. What are tips for following this plan? Reading food labels  Check labels for hidden FODMAPs such as: ? High-fructose syrup. ? Honey. ? Agave. ? Natural fruit flavors. ? Onion or garlic powder.  Choose low-FODMAP foods that contain 3-4 grams of fiber per serving.  Check food labels for serving  sizes. Eat only one serving at a time to make sure FODMAP levels stay low. Shopping  Shop with a list of foods that are recommended on this diet and make a meal plan. Meal planning  Follow a low-FODMAP eating plan for up to 6 weeks, or as told by your health care provider or dietitian.  To follow the eating plan: 1. Eliminate high-FODMAP foods from your diet completely. Choose only low-FODMAP foods to eat. You will do this for 2-6 weeks. 2. Gradually reintroduce high-FODMAP foods into your diet one at a time. Most people should wait a few days before introducing the next new high-FODMAP food into their meal plan. Your dietitian can recommend how quickly you may reintroduce foods. 3. Keep a daily record of what and how much you eat and drink. Make note of any symptoms that you have after eating. 4. Review your daily record with a dietitian regularly to identify which foods you can eat and which foods you should avoid. General tips  Drink enough fluid each day to keep your urine pale yellow.  Avoid processed foods. These often have added sugar and may be high in FODMAPs.  Avoid most dairy products, whole grains, and sweeteners.  Work with a dietitian to make sure you get enough fiber in your diet.  Avoid high FODMAP foods at meals to manage symptoms. Recommended foods  Fruits Bananas, oranges, tangerines, lemons, limes, blueberries, raspberries, strawberries, grapes, cantaloupe, honeydew melon, kiwi, papaya, passion fruit, and pineapple. Limited amounts of dried cranberries, banana chips, and shredded coconut. Vegetables Eggplant, zucchini, cucumber, peppers, green beans, bean sprouts, lettuce, arugula, kale, Swiss chard, spinach, collard greens, bok choy, summer squash, potato, and tomato. Limited amounts of corn, carrot, and sweet potato. Green parts of scallions. Grains Gluten-free grains, such as rice, oats, buckwheat, quinoa, corn, polenta, and millet. Gluten-free pasta, bread, or  cereal. Rice noodles. Corn tortillas. Meats and other proteins Unseasoned beef, pork, poultry, or fish. Eggs. Berniece Salines. Tofu (firm) and tempeh. Limited amounts of nuts and seeds, such as almonds, walnuts, [place] nuts, pecans, peanuts, nut butters, pumpkin seeds, chia seeds, and sunflower seeds. Dairy Lactose-free milk, yogurt, and kefir. Lactose-free cottage cheese and ice cream. Non-dairy milks, such as almond, coconut, hemp, and rice milk. Non-dairy yogurt. Limited amounts of goat cheese, brie, mozzarella, parmesan, swiss, and other hard cheeses. Fats and oils Butter-free spreads. Vegetable oils, such as olive, canola, and sunflower oil. Seasoning and other foods Artificial sweeteners with names that do not end in "ol," such as aspartame, saccharine, and stevia. Maple syrup, white table sugar, raw sugar, brown sugar, and molasses. Mayonnaise, soy sauce, and tamari. Fresh basil, coriander, parsley, rosemary, and thyme. Beverages Water and mineral water. Sugar-sweetened soft drinks. Small amounts of orange juice or cranberry juice. Black and green tea. Most dry wines. Coffee. The items listed above may not be a complete list of foods and beverages you can eat. Contact a dietitian for more information. Foods to avoid Fruits Fresh, dried, and juiced forms of apple, pear, watermelon, peach, plum, cherries, apricots, blackberries, boysenberries, figs, nectarines, and mango. Avocado. Vegetables Chicory root, artichoke, asparagus, cabbage, snow peas, Brussels sprouts, broccoli, sugar snap peas, mushrooms, celery, and cauliflower. Onions, garlic, leeks, and the white part of scallions. Grains Wheat, including kamut, durum, and semolina. Barley and bulgur. Couscous. Wheat-based cereals. Wheat noodles, bread, crackers, and pastries. Meats and other proteins Fried or fatty meat. Sausage. Cashews and pistachios. Soybeans, baked beans, black beans, chickpeas, kidney beans, fava beans, navy beans, lentils,  black-eyed peas, and split peas. Dairy Milk, yogurt, ice cream, and soft cheese. Cream and sour cream. Milk-based sauces. Custard. Buttermilk. Soy milk. Seasoning and other foods Any sugar-free gum or candy. Foods that contain artificial sweeteners such as sorbitol, mannitol, isomalt, or xylitol. Foods that contain honey, high-fructose corn syrup, or agave. Bouillon, vegetable stock, beef stock, and chicken stock. Garlic and onion powder. Condiments made with onion, such as hummus, chutney, pickles, relish, salad dressing, and salsa. Tomato paste. Beverages Chicory-based drinks. Coffee substitutes. Chamomile tea. Fennel tea. Sweet or fortified wines such as port or sherry. Diet soft drinks made with isomalt, mannitol, maltitol, sorbitol, or xylitol. Apple, pear, and mango juice. Juices with high-fructose corn syrup. The items listed above may not be a complete list of foods and beverages you should avoid. Contact a dietitian for more information. Summary  FODMAP stands for fermentable oligosaccharides, disaccharides, monosaccharides, and polyols. These are sugars that are hard for some people to digest.  A low-FODMAP eating plan is a short-term diet that helps to ease symptoms of certain bowel diseases.  The eating plan usually lasts up to 6 weeks. After that, high-FODMAP foods are reintroduced gradually and one at a time. This can help you find out which foods may be causing symptoms.  A low-FODMAP eating plan can be complicated. It is best to work with a dietitian who has experience  with this type of plan. This information is not intended to replace advice given to you by your health care provider. Make sure you discuss any questions you have with your health care provider. Document Revised: 05/26/2019 Document Reviewed: 05/26/2019 Elsevier Patient Education  [place].

## 2020-04-16 ENCOUNTER — Other Ambulatory Visit: Payer: Self-pay | Admitting: Internal Medicine

## 2020-04-16 ENCOUNTER — Telehealth: Payer: Self-pay

## 2020-04-16 DIAGNOSIS — E876 Hypokalemia: Secondary | ICD-10-CM

## 2020-04-16 DIAGNOSIS — D72829 Elevated white blood cell count, unspecified: Secondary | ICD-10-CM

## 2020-04-16 DIAGNOSIS — R7989 Other specified abnormal findings of blood chemistry: Secondary | ICD-10-CM

## 2020-04-16 DIAGNOSIS — R748 Abnormal levels of other serum enzymes: Secondary | ICD-10-CM

## 2020-04-16 MED ORDER — POTASSIUM CHLORIDE ER 10 MEQ PO CPCR: 20.0000 meq | ORAL_CAPSULE | Freq: Every day | ORAL | 3 refills | Status: DC

## 2020-04-16 NOTE — Telephone Encounter (Signed)
Pt returned your call about lab results

## 2020-04-17 NOTE — Telephone Encounter (Signed)
Patient informed and verbalized understanding

## 2020-04-22 ENCOUNTER — Other Ambulatory Visit: Payer: Self-pay | Admitting: Internal Medicine

## 2020-04-22 DIAGNOSIS — L509 Urticaria, unspecified: Secondary | ICD-10-CM

## 2020-04-23 ENCOUNTER — Other Ambulatory Visit: Payer: Self-pay

## 2020-04-23 ENCOUNTER — Other Ambulatory Visit (INDEPENDENT_AMBULATORY_CARE_PROVIDER_SITE_OTHER): Payer: BC Managed Care – PPO

## 2020-04-23 DIAGNOSIS — R748 Abnormal levels of other serum enzymes: Secondary | ICD-10-CM | POA: Diagnosis not present

## 2020-04-23 DIAGNOSIS — D72829 Elevated white blood cell count, unspecified: Secondary | ICD-10-CM

## 2020-04-23 DIAGNOSIS — E876 Hypokalemia: Secondary | ICD-10-CM | POA: Diagnosis not present

## 2020-04-24 LAB — CBC WITH DIFFERENTIAL/PLATELET
Basophils Absolute: 0.1 10*3/uL (ref 0.0–0.1)
Basophils Relative: 0.9 % (ref 0.0–3.0)
Eosinophils Absolute: 0.2 10*3/uL (ref 0.0–0.7)
Eosinophils Relative: 2 % (ref 0.0–5.0)
HCT: 39 % (ref 36.0–46.0)
Hemoglobin: 13 g/dL (ref 12.0–15.0)
Lymphocytes Relative: 31.5 % (ref 12.0–46.0)
Lymphs Abs: 3 10*3/uL (ref 0.7–4.0)
MCHC: 33.3 g/dL (ref 30.0–36.0)
MCV: 85.1 fl (ref 78.0–100.0)
Monocytes Absolute: 0.6 10*3/uL (ref 0.1–1.0)
Monocytes Relative: 6.3 % (ref 3.0–12.0)
Neutro Abs: 5.7 10*3/uL (ref 1.4–7.7)
Neutrophils Relative %: 59.3 % (ref 43.0–77.0)
Platelets: 295 10*3/uL (ref 150.0–400.0)
RBC: 4.58 Mil/uL (ref 3.87–5.11)
RDW: 14.4 % (ref 11.5–15.5)
WBC: 9.6 10*3/uL (ref 4.0–10.5)

## 2020-04-24 LAB — COMPREHENSIVE METABOLIC PANEL
ALT: 51 U/L — ABNORMAL HIGH (ref 0–35)
AST: 42 U/L — ABNORMAL HIGH (ref 0–37)
Albumin: 4.5 g/dL (ref 3.5–5.2)
Alkaline Phosphatase: 91 U/L (ref 39–117)
BUN: 23 mg/dL (ref 6–23)
CO2: 28 mEq/L (ref 19–32)
Calcium: 10.2 mg/dL (ref 8.4–10.5)
Chloride: 102 mEq/L (ref 96–112)
Creatinine, Ser: 1.04 mg/dL (ref 0.40–1.20)
GFR: 58.46 mL/min — ABNORMAL LOW (ref >60.00)
Glucose, Bld: 191 mg/dL — ABNORMAL HIGH (ref 70–99)
Potassium: 3.1 mEq/L — ABNORMAL LOW (ref 3.5–5.1)
Sodium: 140 mEq/L (ref 135–145)
Total Bilirubin: 0.3 mg/dL (ref 0.2–1.2)
Total Protein: 7.3 g/dL (ref 6.0–8.3)

## 2020-04-25 LAB — PATHOLOGIST SMEAR REVIEW
Basophils Absolute: 0.1 10*3/uL (ref 0.0–0.2)
Basos: 1 %
EOS (ABSOLUTE): 0.2 10*3/uL (ref 0.0–0.4)
Eos: 2 %
Hematocrit: 40.3 % (ref 34.0–46.6)
Hemoglobin: 13.3 g/dL (ref 11.1–15.9)
Immature Grans (Abs): 0 10*3/uL (ref 0.0–0.1)
Immature Granulocytes: 0 %
Lymphocytes Absolute: 3.2 10*3/uL — ABNORMAL HIGH (ref 0.7–3.1)
Lymphs: 33 %
MCH: 28.2 pg (ref 26.6–33.0)
MCHC: 33 g/dL (ref 31.5–35.7)
MCV: 86 fL (ref 79–97)
Monocytes Absolute: 0.7 10*3/uL (ref 0.1–0.9)
Monocytes: 7 %
Neutrophils Absolute: 5.5 10*3/uL (ref 1.4–7.0)
Neutrophils: 57 %
Path Rev PLTs: NORMAL
Path Rev RBC: NORMAL
Path Rev WBC: NORMAL
Platelets: 302 10*3/uL (ref 150–450)
RBC: 4.71 x10E6/uL (ref 3.77–5.28)
RDW: 13.6 % (ref 11.7–15.4)
WBC: 9.7 10*3/uL (ref 3.4–10.8)

## 2020-04-27 ENCOUNTER — Other Ambulatory Visit: Payer: Self-pay | Admitting: Internal Medicine

## 2020-04-27 DIAGNOSIS — R197 Diarrhea, unspecified: Secondary | ICD-10-CM

## 2020-04-30 ENCOUNTER — Telehealth: Payer: Self-pay | Admitting: Internal Medicine

## 2020-04-30 ENCOUNTER — Telehealth: Payer: Self-pay | Admitting: Family

## 2020-04-30 NOTE — Telephone Encounter (Signed)
Blood smear review normal  Repeat white blood cell ct normal  Potassium still low mail high potassium food list I -s she taking 2 potassium pills daily ? Liver enzymes still elevated  -is she agreeable to repeat US abdomen to further work this up?   -correction changed my mind instead of [place] agreeable to CT abdomen/pelvis?   Kidney #s the same cut the maxzide fluid pill in 1/2 for now Hydrate 55-64 ounces water daily

## 2020-04-30 NOTE — Telephone Encounter (Signed)
Spoke with Rasheedah and she states she has called the Patient and scheduled already.

## 2020-04-30 NOTE — Telephone Encounter (Signed)
See result note from 04/30/20

## 2020-04-30 NOTE — Telephone Encounter (Signed)
Called to inform Patient of lab results. Patient informed and verbalized understanding.    Patient states that a week ago Saturday she noticed blood in her stool. Patient has a history of hemorrhoids and thought it was this. As that weekend went on she had blood with gas. States some of the blood was clotted like when having a period. Patient states this lasted until today. No blood today.   Patient scheduled for an appointment Thursday at 12:00 with Kristin [name].

## 2020-04-30 NOTE — Telephone Encounter (Signed)
Ived ordered CT ab/pelvis for 05/01/20 and will make referral to Dr. Marius Ditch GI as well

## 2020-05-01 ENCOUNTER — Ambulatory Visit
Admission: RE | Admit: 2020-05-01 | Discharge: 2020-05-01 | Disposition: A | Discharge status: Home / Self Care | Payer: BC Managed Care – PPO | Source: Ambulatory Visit | Attending: Internal Medicine | Admitting: Internal Medicine

## 2020-05-01 ENCOUNTER — Encounter: Payer: Self-pay | Admitting: *Deleted

## 2020-05-01 ENCOUNTER — Telehealth: Payer: Self-pay

## 2020-05-01 ENCOUNTER — Other Ambulatory Visit: Payer: Self-pay

## 2020-05-01 DIAGNOSIS — R197 Diarrhea, unspecified: Secondary | ICD-10-CM | POA: Diagnosis not present

## 2020-05-01 DIAGNOSIS — R1084 Generalized abdominal pain: Secondary | ICD-10-CM | POA: Insufficient documentation

## 2020-05-01 DIAGNOSIS — K921 Melena: Secondary | ICD-10-CM | POA: Diagnosis not present

## 2020-05-01 DIAGNOSIS — K760 Fatty (change of) liver, not elsewhere classified: Secondary | ICD-10-CM | POA: Diagnosis not present

## 2020-05-01 DIAGNOSIS — K580 Irritable bowel syndrome with diarrhea: Secondary | ICD-10-CM | POA: Diagnosis not present

## 2020-05-01 DIAGNOSIS — R109 Unspecified abdominal pain: Secondary | ICD-10-CM | POA: Diagnosis not present

## 2020-05-01 DIAGNOSIS — K589 Irritable bowel syndrome without diarrhea: Secondary | ICD-10-CM | POA: Insufficient documentation

## 2020-05-01 MED ORDER — IOHEXOL 300 MG/ML  SOLN
100.0000 mL | Freq: Once | INTRAMUSCULAR | Status: AC | PRN
  Administered 2020-05-01: 100 mL via INTRAVENOUS

## 2020-05-01 MED ORDER — TRIAMTERENE-HCTZ 37.5-25 MG PO TABS: 0.5000 | ORAL_TABLET | Freq: Every day | ORAL | 3 refills | Status: DC

## 2020-05-01 NOTE — Chronic Care Management (AMB) (Signed)
Care Management   Note  05/01/2020 Name: ALONNI BERGGREN MRN: [id number] DOB: 04-16-1960  Evalee Mutton Miranda is a 60 y.o. year old female who is a primary care patient of McLean-Scocuzza, Pasty Spillers, MD. I reached out to Olinka Alling Schaner by phone today in response to a referral sent by Ms. Evalee [employer]ton Group's PCP McLean-Scocuzza, Pasty Spillers, MD.    Ms. Hermsen was given information about care management services today including:  1. Care management services include personalized support from designated clinical staff supervised by her physician, including individualized plan of care and coordination with other care providers 2. 24/7 contact phone numbers for assistance for urgent and routine care needs. 3. The patient may stop care management services at any time by phone call to the office staff.  Patient agreed to services and verbal consent obtained.   Follow up plan: Telephone appointment with care management team member scheduled for:05/28/2020  Penne Lash, RMA Care Guide, Embedded Care Coordination [facility]  [place], [place] [address] Direct Dial: [phone] [email or web address]

## 2020-05-02 NOTE — Telephone Encounter (Signed)
Ct a/p complete, no acute findings

## 2020-05-03 ENCOUNTER — Encounter: Payer: Self-pay | Admitting: Family

## 2020-05-03 ENCOUNTER — Other Ambulatory Visit: Payer: Self-pay

## 2020-05-03 ENCOUNTER — Ambulatory Visit (INDEPENDENT_AMBULATORY_CARE_PROVIDER_SITE_OTHER): Payer: BC Managed Care – PPO | Admitting: Family

## 2020-05-03 VITALS — BP 120/70 | HR 77 | Temp 97.8°F | Ht 63.0 in | Wt 178.8 lb

## 2020-05-03 DIAGNOSIS — K625 Hemorrhage of anus and rectum: Secondary | ICD-10-CM | POA: Insufficient documentation

## 2020-05-03 LAB — CBC WITH DIFFERENTIAL/PLATELET
Basophils Absolute: 0.1 10*3/uL (ref 0.0–0.1)
Basophils Relative: 1.1 % (ref 0.0–3.0)
Eosinophils Absolute: 0.2 10*3/uL (ref 0.0–0.7)
Eosinophils Relative: 1.9 % (ref 0.0–5.0)
HCT: 40.7 % (ref 36.0–46.0)
Hemoglobin: 13.6 g/dL (ref 12.0–15.0)
Lymphocytes Relative: 32.2 % (ref 12.0–46.0)
Lymphs Abs: 2.9 10*3/uL (ref 0.7–4.0)
MCHC: 33.4 g/dL (ref 30.0–36.0)
MCV: 85.7 fl (ref 78.0–100.0)
Monocytes Absolute: 0.5 10*3/uL (ref 0.1–1.0)
Monocytes Relative: 6 % (ref 3.0–12.0)
Neutro Abs: 5.2 10*3/uL (ref 1.4–7.7)
Neutrophils Relative %: 58.8 % (ref 43.0–77.0)
Platelets: 323 10*3/uL (ref 150.0–400.0)
RBC: 4.76 Mil/uL (ref 3.87–5.11)
RDW: 14.6 % (ref 11.5–15.5)
WBC: 8.9 10*3/uL (ref 4.0–10.5)

## 2020-05-03 LAB — BASIC METABOLIC PANEL
BUN: 13 mg/dL (ref 6–23)
CO2: 28 mEq/L (ref 19–32)
Calcium: 9.6 mg/dL (ref 8.4–10.5)
Chloride: 104 mEq/L (ref 96–112)
Creatinine, Ser: 0.97 mg/dL (ref 0.40–1.20)
GFR: 63.55 mL/min (ref >60.00)
Glucose, Bld: 106 mg/dL — ABNORMAL HIGH (ref 70–99)
Potassium: 4.1 mEq/L (ref 3.5–5.1)
Sodium: 141 mEq/L (ref 135–145)

## 2020-05-03 NOTE — Assessment & Plan Note (Addendum)
Resolved. Patient well appearing, stable. Benign exam. No thrombosed hemorrhoids. Negative stool occult. Reported 20 lb weight loss.  H/o IBS. Advised to return to Dr Marius Ditch for further evaluation of rectal bleeding and hepatic steatosis as seen on CT a/p.

## 2020-05-03 NOTE — Patient Instructions (Signed)
Please let us know of any recurrence of rectal bleeding.   Referral back to dr Shaaron Adler to meet you

## 2020-05-03 NOTE — Progress Notes (Signed)
Subjective:    Patient ID: Kristin [name], female    DOB: 07-Jul-1960, 60 y.o.   MRN: [id number]  CC: Kristin [name] is a 60 y.o. female who presents today for an acute visit.    HPI: Complains of BRB rectal bleeding for 10 days, stopped 5 days ago. Dripping blood into toilet bowel. No coffee ground stool , melana in color, or blood mixed in stool. No hematuria, dysuria, vomiting, fever.   She has lost 20 lbs in 3 months, with some intention however she is cheating 'a lot .'  No fever, unusual foods, dysuria, pelvic pain, vaginal bleeding. Travels from here to [facility] regularly   Diagnosed IBS couple of years ago and not unusual to alternate from diarrhea to constipation. She avoid lactose as exacerbates diarrhea. Last BM today.   H/o hemorrhoid - prior bleeding lasts 1-2 days. This last episode was longer than in the past. She hasnt tried any medication for this.   GERD- controlled with prilosec 40mg  qd. No epigastric burning.  She is not taking [place]. She takes bentyl prn for abdominal cramping with relief. h/o cholecystectomy, appendectomy, hysterectomy.   Ct abdomen and pelvis- hepatic steatosis.   Elevated LFTs   Colonoscopy 01/2019 with hemorrhoids HISTORY:  Past Medical History:  Diagnosis Date  . Allergy   . Arthritis   . Basal cell carcinoma    nose, left cheek, right upper lip, right posterior shoulder most recent 6 years ago on left cheek  . Chicken pox   . Complication of anesthesia    blood pressure elevated after gastric sleeve surgery in recovery, pt does not want mask over face  . COVID-19    03/01/20 had MAB 03/03/20 and felt better next day 03/04/20  . Depression   . Diabetes mellitus without complication ([place])    pre-diabetic currently, was diabetic prior to gastric sleeve surgery   . Disorder of right rotator cuff    10/03/19 MRI right shoulder abnormal Dr. Maurine Cane  . Fatty liver   . Gout   . Hematuria 10/15/2018  . History of shingles   .  Hyperlipidemia   . Hypertension    no problems since losing weight after gastric sleeve surgery  . Insomnia   . Migraine   . Obesity   . Osteopenia    Past Surgical History:  Procedure Laterality Date  . ABDOMINAL HYSTERECTOMY     no h/o abnormal pap still has 1 ovary left  . ANKLE ARTHROSCOPY Left 05/29/2016   Procedure: ANKLE ARTHROSCOPY;  Surgeon: [name], DPM;  Location: [place];  Service: Podiatry;  Laterality: Left;  . APPENDECTOMY    . basal cell removed    . [place]  . CHOLECYSTECTOMY    . COLONOSCOPY WITH PROPOFOL N/A 01/31/2019   Procedure: COLONOSCOPY WITH PROPOFOL;  Surgeon: Lin Landsman, MD;  Location[facility] ENDOSCOPY;  Service: Gastroenterology;  Laterality: N/A;  . cyst removed from wrist    . ESOPHAGOGASTRODUODENOSCOPY (EGD) WITH PROPOFOL N/A 01/31/2019   Procedure: ESOPHAGOGASTRODUODENOSCOPY (EGD) WITH PROPOFOL;  Surgeon: Lin Landsman, MD;  Location: [place];  Service: Gastroenterology;  Laterality: N/A;  . FOOT SURGERY     left foot 11/2016 Dr. Laymond Purser   . left elbow surgery     07/2017 Dr Leanor Kail epicondylitis   . MOHS SURGERY     x 2 face nose and left cheek   . OSTEOCHONDROMA EXCISION Left 05/29/2016  Procedure: OSTEOCHONDRAL DRILLING TALUS;  Surgeon: [name], DPM;  Location: [place];  Service: Podiatry;  Laterality: Left;  . PLANTAR FASCIA RELEASE Left 05/29/2016   Procedure: ENDOSCOPIC PLANTAR FASCIOTOMY;  Surgeon: [name], DPM;  Location: [place];  Service: Podiatry;  Laterality: Left;  . ROTATOR CUFF REPAIR     ? right likely shoulder  . STOMACH SURGERY  2017   gastric sleeve surgery  . TENNIS ELBOW RELEASE/NIRSCHEL PROCEDURE Left 08/05/2017   Procedure: TENNIS ELBOW RELEASE/NIRSCHEL PROCEDURE;  Surgeon: Leanor Kail, MD;  Location: [facility];  Service: Orthopedics;  Laterality: Left;  . TUBAL LIGATION    . wedge resection of ovary  1982   Family History  Problem Relation Age of  Onset  . Heart disease Mother   . Breast cancer Mother 23       metastatic, now hip, spine, and leg   . Arthritis Mother   . Hyperlipidemia Mother   . Cancer Mother        spinal surgery. breast dx'ed age 42 now metastatic 62 as of 07/13/18  . Cystic fibrosis Mother        carrier   . Heart disease Father   . Lung cancer Father        smoker  . Hyperlipidemia Father   . Hypertension Father   . Diabetes Sister   . Stroke Sister   . Other Sister        covid +  . Diabetes Maternal Aunt   . Diabetes Maternal Uncle   . Diabetes Maternal Grandfather   . Hyperlipidemia Maternal Grandfather   . Arthritis Maternal Grandmother   . Sudden death Cousin   . Diabetes Maternal Aunt   . Diabetes Sister   . Cystic fibrosis Other        great niece   . Colon cancer Neg Hx   . Ovarian cancer Neg Hx     Allergies: Tetracyclines & related, Amoxicillin, Alcohol, Azithromycin, and Metoprolol Current Outpatient Medications on File Prior to Visit  Medication Sig Dispense Refill  . amitriptyline (ELAVIL) 25 MG tablet TAKE 1 TABLET (25 MG TOTAL) BY MOUTH AT BEDTIME. INCREASE TO 50MG  AFTER 2 WEEKS IF NEEDED 180 tablet 0  . amLODipine (NORVASC) 5 MG tablet Take 1 tablet (5 mg total) by mouth daily. And stop 2.5 mg daily (2 pills) 90 tablet 3  . Cholecalciferol (DIALYVITE VITAMIN D 5000 PO) Take 5,000 Units by mouth every other day.    . dicyclomine (BENTYL) 10 MG capsule Take 1 capsule (10 mg total) by mouth 4 (four) times daily -  before meals and at bedtime. As needed 120 capsule 11  . fexofenadine (ALLEGRA) 180 MG tablet Take 1 tablet (180 mg total) by mouth daily as needed for allergies or rhinitis. 90 tablet 3  . hydrOXYzine (ATARAX/VISTARIL) 25 MG tablet TAKE 1 TABLET BY MOUTH 2 TIMES DAILY AS NEEDED. 180 tablet 1  . metFORMIN (GLUCOPHAGE) 500 MG tablet Take 1 tablet (500 mg total) by mouth 2 (two) times daily with a meal. 180 tablet 3  . metroNIDAZOLE (METROCREAM) 0.75 % cream Apply to cheeks  and nose every night for rosacea. 45 g 5  . montelukast (SINGULAIR) 10 MG tablet Take 1 tablet (10 mg total) by mouth at bedtime. 90 tablet 3  . omeprazole (PRILOSEC) 40 MG capsule TAKE 1 CAPSULE (40 MG TOTAL) BY MOUTH DAILY BEFORE SUPPER. 90 capsule 0  . ondansetron (ZOFRAN-ODT) 4 MG disintegrating tablet Take 1 tablet (4 mg  total) by mouth every 8 (eight) hours as needed. for nausea 30 tablet 5  . potassium chloride (MICRO-K) 10 MEQ CR capsule Take 2 capsules (20 mEq total) by mouth daily. 180 capsule 3  . Rimegepant Sulfate (NURTEC) 75 MG TBDP Take 75 mg by mouth daily as needed (take for abortive therapy of migraine, no more than 1 tablet in 24 hours or 10 per month). 10 tablet 11  . rosuvastatin (CRESTOR) 20 MG tablet Take 1 tablet (20 mg total) by mouth every Monday, Wednesday, and Friday. 45 tablet 3  . topiramate (TOPAMAX) 50 MG tablet Take 1 tablet (50 mg total) by mouth 2 (two) times daily. 90 tablet 3  . triamcinolone (KENALOG) 0.1 % Apply 1 application topically 2 (two) times daily. Prn itching hives on body not face 454 g 0  . triamterene-hydrochlorothiazide (MAXZIDE-25) 37.5-25 MG tablet Take 0.5 tablets by mouth daily. In the am 90 tablet 3  . zolpidem (AMBIEN CR) 12.5 MG CR tablet Take 1 tablet (12.5 mg total) by mouth at bedtime as needed. 30 tablet 5  . albuterol (VENTOLIN HFA) 108 (90 Base) MCG/ACT inhaler Inhale 1-2 puffs into the lungs every 6 (six) hours as needed for wheezing or shortness of breath. (Patient not taking: Reported on 05/03/2020) 18 g 11  . cholestyramine (QUESTRAN) 4 g packet TAKE 1 PACKET (4 G TOTAL) BY MOUTH 3 (THREE) TIMES DAILY WITH MEALS. (Patient not taking: Reported on 05/03/2020) 90 packet 1  . mupirocin ointment (BACTROBAN) 2 % Apply 1 application topically 2 (two) times daily. (Patient not taking: Reported on 05/03/2020) 30 g 0   No current facility-administered medications on file prior to visit.    Social History   Tobacco Use  . Smoking status:  Never Smoker  . Smokeless tobacco: Never Used  Vaping Use  . Vaping Use: Never used  Substance Use Topics  . Alcohol use: Never  . Drug use: Never    Review of Systems  Constitutional: Negative for chills and fever.  Respiratory: Negative for cough.   Cardiovascular: Negative for chest pain and palpitations.  Gastrointestinal: Positive for anal bleeding, constipation and diarrhea. Negative for abdominal pain, nausea and vomiting.  Genitourinary: Negative for dysuria and pelvic pain.      Objective:    BP 120/70   Pulse 77   Temp 97.8 F (36.6 C)   Ht 5\' 3"  (1.6 m)   Wt 178 lb 12.8 oz (81.1 kg)   SpO2 98%   BMI 31.67 kg/m   Wt Readings from Last 3 Encounters:  05/03/20 178 lb 12.8 oz (81.1 kg)  04/05/20 181 lb (82.1 kg)  03/28/20 180 lb (81.6 kg)    Physical Exam Vitals reviewed.  Constitutional:      Appearance: Normal appearance. She is well-developed.  Eyes:     Conjunctiva/sclera: Conjunctivae normal.  Cardiovascular:     Rate and Rhythm: Normal rate and regular rhythm.     Pulses: Normal pulses.     Heart sounds: Normal heart sounds.  Pulmonary:     Effort: Pulmonary effort is normal.     Breath sounds: Normal breath sounds. No wheezing, rhonchi or rales.  Abdominal:     General: Bowel sounds are normal. There is no distension.     Palpations: Abdomen is soft. Abdomen is not rigid. There is no fluid wave or mass.     Tenderness: There is no abdominal tenderness. There is no guarding or rebound.  Genitourinary:    Rectum: Normal. Guaiac result  negative. No mass, external hemorrhoid or internal hemorrhoid.     Comments: No thrombosed hemorrhoids. Skin tags around anus. No bleeding seen.  Skin:    General: Skin is warm and dry.  Neurological:     Mental Status: She is alert.  Psychiatric:        Speech: Speech normal.        Behavior: Behavior normal.        Thought Content: Thought content normal.        Assessment & Plan:   Problem List Items  Addressed This Visit      Digestive   Rectal bleeding - Primary    Resolved. Patient well appearing, stable. Benign exam. No thrombosed hemorrhoids. Negative stool occult. Reported 20 lb weight loss.  H/o IBS. Advised to return to Dr Marius Ditch for further evaluation of rectal bleeding and hepatic steatosis as seen on CT a/p.       Relevant Orders   CBC with Differential/Platelet   Basic metabolic panel   Ambulatory referral to Gastroenterology       I am having Murvin Natal. Mosqueda "Cathy" maintain her Nurtec, Cholecalciferol (DIALYVITE VITAMIN D 5000 PO), fexofenadine, amLODipine, amitriptyline, topiramate, zolpidem, montelukast, albuterol, ondansetron, metFORMIN, metroNIDAZOLE, triamcinolone cream, mupirocin ointment, omeprazole, dicyclomine, rosuvastatin, potassium chloride, hydrOXYzine, cholestyramine, and triamterene-hydrochlorothiazide.   No orders of the defined types were placed in this encounter.   Return precautions given.   Risks, benefits, and alternatives of the medications and treatment plan prescribed today were discussed, and patient expressed understanding.   Education regarding symptom management and diagnosis given to patient on AVS.  Continue to follow with McLean-Scocuzza, Nino Glow, MD for routine health maintenance.   Murvin Natal Fults and I agreed with plan.   Mable Paris, FNP

## 2020-05-07 ENCOUNTER — Telehealth: Payer: Self-pay | Admitting: Gastroenterology

## 2020-05-07 ENCOUNTER — Encounter: Payer: Self-pay | Admitting: *Deleted

## 2020-05-07 MED ORDER — HYDROCORTISONE (PERIANAL) 2.5 % EX CREA
1.0000 | TOPICAL_CREAM | Freq: Two times a day (BID) | CUTANEOUS | 0 refills | Status: DC
Start: 2020-05-07 — End: 2020-07-18

## 2020-05-07 NOTE — Telephone Encounter (Signed)
Called and left a message for call back  

## 2020-05-07 NOTE — Telephone Encounter (Signed)
Patient has 06/08/20 appointment, asks for call to advise on what she can do for rectal bleeding until time of her appointment.

## 2020-05-07 NOTE — Addendum Note (Signed)
Addended by: Ulyess Blossom L on: 05/07/2020 10:13 AM   Modules accepted: Orders

## 2020-05-07 NOTE — Telephone Encounter (Addendum)
Patient states she has been having rectal bleeding from 04/19/2020  To last Monday and then it stated again this weekend . Patient states it is with every bowel movement. Patient states the blood feels up the toilet and  Has clots. Denies any rectal pain or itching

## 2020-05-07 NOTE — Telephone Encounter (Signed)
Sent medication to the pharmacy. Had a cancellation for tomorrow at 3 and patient states she will take it moved appointment to then

## 2020-05-07 NOTE — Telephone Encounter (Signed)
I saw her last year.  She does have hemorrhoids.  Let's try Anusol cream twice daily.  Can we bring her in sooner for hemorrhoid banding?  Thanks RV

## 2020-05-08 ENCOUNTER — Other Ambulatory Visit: Payer: Self-pay

## 2020-05-08 ENCOUNTER — Ambulatory Visit (INDEPENDENT_AMBULATORY_CARE_PROVIDER_SITE_OTHER): Payer: BC Managed Care – PPO | Admitting: Gastroenterology

## 2020-05-08 ENCOUNTER — Encounter: Payer: Self-pay | Admitting: Gastroenterology

## 2020-05-08 ENCOUNTER — Other Ambulatory Visit: Payer: Self-pay | Admitting: Internal Medicine

## 2020-05-08 VITALS — BP 137/84 | HR 85 | Temp 97.9°F | Ht 63.0 in | Wt 178.5 lb

## 2020-05-08 DIAGNOSIS — K760 Fatty (change of) liver, not elsewhere classified: Secondary | ICD-10-CM | POA: Diagnosis not present

## 2020-05-08 DIAGNOSIS — K640 First degree hemorrhoids: Secondary | ICD-10-CM | POA: Diagnosis not present

## 2020-05-08 DIAGNOSIS — R197 Diarrhea, unspecified: Secondary | ICD-10-CM

## 2020-05-08 DIAGNOSIS — K625 Hemorrhage of anus and rectum: Secondary | ICD-10-CM

## 2020-05-08 DIAGNOSIS — R7989 Other specified abnormal findings of blood chemistry: Secondary | ICD-10-CM | POA: Diagnosis not present

## 2020-05-08 DIAGNOSIS — R198 Other specified symptoms and signs involving the digestive system and abdomen: Secondary | ICD-10-CM

## 2020-05-08 NOTE — Progress Notes (Signed)
Cephas Darby, MD [address]  [place]  [place], [place] [address]  Main: [phone]  Fax: [phone]    Gastroenterology Consultation  Referring Provider:     McLean-Scocuzza, Olivia Mackie * Primary Care Physician:  McLean-Scocuzza, Nino Glow, MD Primary Gastroenterologist:  Dr. Cephas Darby Reason for Consultation: Chronic diarrhea, rectal bleeding        HPI:   Kristin [name] is a 60 y.o. female referred by Dr. Terese Door, Nino Glow, MD  for consultation & management of chronic diarrhea.  Patient reports that she started experiencing nonbloody watery bowel movements since beginning of October, primarily postprandial abdominal cramps in the left mid quadrant followed by a BM.  This has restricted her travel to [place] to take care of her mother.  She underwent work-up including stool studies to rule out infection and came back negative.  She stopped Celebrex thinking that it may have caused diarrhea but unchanged.  She denies any new medications 3 months.  She does have mild diabetes.  TSH is normal.  No evidence of anemia, normal CMP.  Also, limited intake of food due to postprandial urgency that has resulted in few pounds of weight loss.  She is currently taking Pepto-Bismol and Imodium as needed which is controlling her diarrhea. She also reports chronic intermittent heartburn for which she takes omeprazole 20 mg at bedtime. She denies nocturnal heartburn She does have chronic migraine and currently on multiple medications, followed by neurology  She used to work at [employer].  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  She had started experiencing bright red blood per rectum with rectal pressure/swelling on 3/30 which lasted for several days, it was bright red blood per rectum associated with clots.  She reports that her bowel movements remain  alternating between constipation and diarrhea within a week itself.  For last 2 days she noticed recurrence of rectal bleeding again.  Patient reports that she made significant changes in her dietary habits, lost more than 10 pounds since last visit.  She does report lower abdominal cramps during bleeding episodes.  Her hemoglobin was normal on 4/14.  She had an upper endoscopy and colonoscopy in 1/21 which were unremarkable  NSAIDs: None  Antiplts/Anticoagulants/Anti thrombotics: None  GI Procedures: Had a colonoscopy at age 69, reportedly normal EGD and colonoscopy 01/31/2019 - Normal duodenal bulb and second portion of the duodenum. Biopsied. - Normal stomach. Biopsied. - Esophagogastric landmarks identified. - Normal gastroesophageal junction and esophagus.  - Preparation of the colon was fair. - Hemorrhoids found on perianal exam. - The examined portion of the ileum was normal. - The entire examined colon is normal. Biopsied. - The distal rectum and anal verge are normal on retroflexion view.  DIAGNOSIS:  A. DUODENUM; COLD BIOPSY:  - DUODENAL MUCOSA WITH NO SIGNIFICANT PATHOLOGIC ALTERATION.  - NEGATIVE FOR FEATURES OF CELIAC DISEASE.  - NEGATIVE FOR DYSPLASIA AND MALIGNANCY.   B. STOMACH; COLD BIOPSY:  - GASTRIC ANTRAL AND OXYNTIC MUCOSA WITH NO SIGNIFICANT PATHOLOGIC  ALTERATION.  - NEGATIVE FOR ACTIVE INFLAMMATION AND H PYLORI.  - NEGATIVE FOR INTESTINAL METAPLASIA, DYSPLASIA, AND MALIGNANCY.   C. COLON, RANDOM; COLD BIOPSY:  - COLONIC MUCOSA WITH NO SIGNIFICANT PATHOLOGIC ALTERATION.  - NEGATIVE FOR MICROSCOPIC COLITIS, DYSPLASIA, AND MALIGNANCY.   D. COLON POLYP, SIGMOID; COLD SNARE:  - TUBULAR ADENOMA.  - NEGATIVE FOR HIGH-GRADE DYSPLASIA AND MALIGNANCY.  She denies family history of inflammatory bowel disease, celiac disease, GI malignancy  Past Medical History:  Diagnosis Date  . Allergy   . Arthritis   . Basal cell carcinoma    nose, left cheek, right upper  lip, right posterior shoulder most recent 6 years ago on left cheek  . Chicken pox   . Complication of anesthesia    blood pressure elevated after gastric sleeve surgery in recovery, pt does not want mask over face  . COVID-19    03/01/20 had MAB 03/03/20 and felt better next day 03/04/20  . Depression   . Diabetes mellitus without complication ([facility])    pre-diabetic currently, was diabetic prior to gastric sleeve surgery   . Disorder of right rotator cuff    10/03/19 MRI right shoulder abnormal Dr. Maurine Cane  . Fatty liver   . Gout   . Hematuria 10/15/2018  . History of shingles   . Hyperlipidemia   . Hypertension    no problems since losing weight after gastric sleeve surgery  . Insomnia   . Migraine   . Obesity   . Osteopenia     Past Surgical History:  Procedure Laterality Date  . ABDOMINAL HYSTERECTOMY     no h/o abnormal pap still has 1 ovary left  . ANKLE ARTHROSCOPY Left 05/29/2016   Procedure: ANKLE ARTHROSCOPY;  Surgeon: [name], DPM;  Location: [facility];  Service: Podiatry;  Laterality: Left;  . APPENDECTOMY    . basal cell removed    . [place]  . CHOLECYSTECTOMY    . COLONOSCOPY WITH PROPOFOL N/A 01/31/2019   Procedure: COLONOSCOPY WITH PROPOFOL;  Surgeon: Lin Landsman, MD;  Location: [facility] ENDOSCOPY;  Service: Gastroenterology;  Laterality: N/A;  . cyst removed from wrist    . ESOPHAGOGASTRODUODENOSCOPY (EGD) WITH PROPOFOL N/A 01/31/2019   Procedure: ESOPHAGOGASTRODUODENOSCOPY (EGD) WITH PROPOFOL;  Surgeon: Lin Landsman, MD;  Location: [place];  Service: Gastroenterology;  Laterality: N/A;  . FOOT SURGERY     left foot 11/2016 Dr. Laymond Purser   . left elbow surgery     07/2017 Dr Leanor Kail epicondylitis   . MOHS SURGERY     x 2 face nose and left cheek   . OSTEOCHONDROMA EXCISION Left 05/29/2016   Procedure: OSTEOCHONDRAL DRILLING TALUS;  Surgeon: [name], DPM;  Location: [facility];  Service: Podiatry;   Laterality: Left;  . PLANTAR FASCIA RELEASE Left 05/29/2016   Procedure: ENDOSCOPIC PLANTAR FASCIOTOMY;  Surgeon: [name], DPM;  Location: [facility];  Service: Podiatry;  Laterality: Left;  . ROTATOR CUFF REPAIR     ? right likely shoulder  . STOMACH SURGERY  2017   gastric sleeve surgery  . TENNIS ELBOW RELEASE/NIRSCHEL PROCEDURE Left 08/05/2017   Procedure: TENNIS ELBOW RELEASE/NIRSCHEL PROCEDURE;  Surgeon: Leanor Kail, MD;  Location: [facility];  Service: Orthopedics;  Laterality: Left;  . TUBAL LIGATION    . wedge resection of ovary  1982    Current Outpatient Medications:  .  albuterol (VENTOLIN HFA) 108 (90 Base) MCG/ACT inhaler, Inhale 1-2 puffs into the lungs every 6 (six) hours as needed for wheezing or shortness of breath., Disp: 18 g, Rfl: 11 .  amitriptyline (ELAVIL) 25 MG tablet, TAKE 1 TABLET (25 MG TOTAL) BY MOUTH AT BEDTIME. INCREASE TO 50MG  AFTER 2 WEEKS IF NEEDED, Disp: 180 tablet, Rfl: 0 .  amLODipine (NORVASC) 5 MG tablet, Take 1 tablet (5 mg total) by  mouth daily. And stop 2.5 mg daily (2 pills), Disp: 90 tablet, Rfl: 3 .  Cholecalciferol (DIALYVITE VITAMIN D 5000 PO), Take 5,000 Units by mouth every other day., Disp: , Rfl:  .  fexofenadine (ALLEGRA) 180 MG tablet, Take 1 tablet (180 mg total) by mouth daily as needed for allergies or rhinitis., Disp: 90 tablet, Rfl: 3 .  hydrocortisone (ANUSOL-HC) 2.5 % rectal cream, Place 1 application rectally 2 (two) times daily., Disp: 30 g, Rfl: 0 .  hydrOXYzine (ATARAX/VISTARIL) 25 MG tablet, TAKE 1 TABLET BY MOUTH 2 TIMES DAILY AS NEEDED., Disp: 180 tablet, Rfl: 1 .  metFORMIN (GLUCOPHAGE) 500 MG tablet, Take 1 tablet (500 mg total) by mouth 2 (two) times daily with a meal., Disp: 180 tablet, Rfl: 3 .  metroNIDAZOLE (METROCREAM) 0.75 % cream, Apply to cheeks and nose every night for rosacea., Disp: 45 g, Rfl: 5 .  montelukast (SINGULAIR) 10 MG tablet, Take 1 tablet (10 mg total) by mouth at bedtime., Disp: 90 tablet, Rfl: 3 .   mupirocin ointment (BACTROBAN) 2 %, Apply 1 application topically 2 (two) times daily., Disp: 30 g, Rfl: 0 .  omeprazole (PRILOSEC) 40 MG capsule, TAKE 1 CAPSULE (40 MG TOTAL) BY MOUTH DAILY BEFORE SUPPER., Disp: 90 capsule, Rfl: 0 .  ondansetron (ZOFRAN-ODT) 4 MG disintegrating tablet, Take 1 tablet (4 mg total) by mouth every 8 (eight) hours as needed. for nausea, Disp: 30 tablet, Rfl: 5 .  potassium chloride (MICRO-K) 10 MEQ CR capsule, Take 2 capsules (20 mEq total) by mouth daily., Disp: 180 capsule, Rfl: 3 .  Rimegepant Sulfate (NURTEC) 75 MG TBDP, Take 75 mg by mouth daily as needed (take for abortive therapy of migraine, no more than 1 tablet in 24 hours or 10 per month)., Disp: 10 tablet, Rfl: 11 .  rosuvastatin (CRESTOR) 20 MG tablet, Take 1 tablet (20 mg total) by mouth every Monday, Wednesday, and Friday., Disp: 45 tablet, Rfl: 3 .  topiramate (TOPAMAX) 50 MG tablet, Take 1 tablet (50 mg total) by mouth 2 (two) times daily., Disp: 90 tablet, Rfl: 3 .  triamcinolone (KENALOG) 0.1 %, Apply 1 application topically 2 (two) times daily. Prn itching hives on body not face, Disp: 454 g, Rfl: 0 .  triamterene-hydrochlorothiazide (MAXZIDE-25) 37.5-25 MG tablet, Take 0.5 tablets by mouth daily. In the am, Disp: 90 tablet, Rfl: 3 .  zolpidem (AMBIEN CR) 12.5 MG CR tablet, Take 1 tablet (12.5 mg total) by mouth at bedtime as needed., Disp: 30 tablet, Rfl: 5   Family History  Problem Relation Age of Onset  . Heart disease Mother   . Breast cancer Mother 9       metastatic, now hip, spine, and leg   . Arthritis Mother   . Hyperlipidemia Mother   . Cancer Mother        spinal surgery. breast dx'ed age 78 now metastatic 47 as of 07/13/18  . Cystic fibrosis Mother        carrier   . Heart disease Father   . Lung cancer Father        smoker  . Hyperlipidemia Father   . Hypertension Father   . Diabetes Sister   . Stroke Sister   . Other Sister        covid +  . Diabetes Maternal Aunt   .  Diabetes Maternal Uncle   . Diabetes Maternal Grandfather   . Hyperlipidemia Maternal Grandfather   . Arthritis Maternal Grandmother   . Sudden death  Cousin   . Diabetes Maternal Aunt   . Diabetes Sister   . Cystic fibrosis Other        great niece   . Colon cancer Neg Hx   . Ovarian cancer Neg Hx      Social History   Tobacco Use  . Smoking status: Never Smoker  . Smokeless tobacco: Never Used  Vaping Use  . Vaping Use: Never used  Substance Use Topics  . Alcohol use: Never  . Drug use: Never    Allergies as of 05/08/2020 - Review Complete 05/08/2020  Allergen Reaction Noted  . Tetracyclines & related Swelling and Rash 06/04/2014  . Amoxicillin  12/10/2018  . Alcohol Rash 12/07/2014  . Azithromycin Rash 07/19/2018  . Metoprolol Diarrhea 12/07/2014    Review of Systems:    All systems reviewed and negative except where noted in HPI.   Physical Exam:  BP 137/84 (BP Location: Left Arm, Patient Position: Sitting, Cuff Size: Normal)   Pulse 85   Temp 97.9 F (36.6 C) (Oral)   Ht 5\' 3"  (1.6 m)   Wt 178 lb 8 oz (81 kg)   BMI 31.62 kg/m  No LMP recorded. Patient has had a hysterectomy.  General:   Alert,  Well-developed, well-nourished, pleasant and cooperative in NAD Head:  Normocephalic and atraumatic. Eyes:  Sclera clear, no icterus.   Conjunctiva pink. Ears:  Normal auditory acuity. Nose:  No deformity, discharge, or lesions. Mouth:  No deformity or lesions,oropharynx pink & moist. Neck:  Supple; no masses or thyromegaly. Lungs:  Respirations even and unlabored.  Clear throughout to auscultation.   No wheezes, crackles, or rhonchi. No acute distress. Heart:  Regular rate and rhythm; no murmurs, clicks, rubs, or gallops. Abdomen:  Normal bowel sounds. Soft, non-tender and non-distended without masses, hepatosplenomegaly or hernias noted.  No guarding or rebound tenderness.   Rectal: Small perianal skin tags, nontender digital rectal exam, anoscopy revealed  inflamed external hemorrhoids Msk:  Symmetrical without gross deformities. Good, equal movement & strength bilaterally. Pulses:  Normal pulses noted. Extremities:  No clubbing or edema.  No cyanosis. Neurologic:  Alert and oriented x3;  grossly normal neurologically. Skin:  Intact without significant lesions or rashes. No jaundice. Psych:  Alert and cooperative. Normal mood and affect.  Imaging Studies: Reviewed  Assessment and Plan:   Kristin [name] is a 60 y.o. white female with BMI 34, metabolic syndrome seen in consultation for rectal bleeding, chronic diarrhea and chronic GERD  Chronic diarrhea alternating with constipation Likely IBS or food intolerance Infectious work-up negative EGD and colonoscopy with biopsies were unremarkable Recommend to check pancreatic fecal elastase levels during next visit  Rectal bleeding Recommend flexible sigmoidoscopy Most likely secondary to external hemorrhoids, therefore discussed about hemorrhoid ligation today.  Patient is agreeable, consent obtained, perform hemorrhoid ligation today  Chronic GERD EGD with no evidence of Barrett's esophagus or erosive esophagitis Continue omeprazole 40 mg once daily Continue antireflux lifestyle  Fatty liver, elevated LFTs, s/p cholecystectomy Monitor LFTs every 6 months Discussed about control of diabetes, weight loss and exercise Recommend viral hepatitis panel and other secondary liver disease work-up during next visit   Follow up in 1 month   Cephas Darby, MD

## 2020-05-21 ENCOUNTER — Encounter
Admission: RE | Disposition: A | Discharge status: Home / Self Care | Payer: Self-pay | Source: Ambulatory Visit | Attending: Gastroenterology

## 2020-05-21 ENCOUNTER — Encounter: Payer: Self-pay | Admitting: Gastroenterology

## 2020-05-21 ENCOUNTER — Ambulatory Visit: Payer: BC Managed Care – PPO | Admitting: Anesthesiology

## 2020-05-21 ENCOUNTER — Ambulatory Visit
Admission: RE | Admit: 2020-05-21 | Discharge: 2020-05-21 | Disposition: A | Discharge status: Home / Self Care | Payer: BC Managed Care – PPO | Source: Ambulatory Visit | Attending: Gastroenterology | Admitting: Gastroenterology

## 2020-05-21 DIAGNOSIS — K625 Hemorrhage of anus and rectum: Secondary | ICD-10-CM | POA: Diagnosis not present

## 2020-05-21 DIAGNOSIS — Z85828 Personal history of other malignant neoplasm of skin: Secondary | ICD-10-CM | POA: Insufficient documentation

## 2020-05-21 DIAGNOSIS — Z881 Allergy status to other antibiotic agents status: Secondary | ICD-10-CM | POA: Insufficient documentation

## 2020-05-21 DIAGNOSIS — Z8616 Personal history of COVID-19: Secondary | ICD-10-CM | POA: Diagnosis not present

## 2020-05-21 DIAGNOSIS — R197 Diarrhea, unspecified: Secondary | ICD-10-CM | POA: Diagnosis not present

## 2020-05-21 DIAGNOSIS — Z7984 Long term (current) use of oral hypoglycemic drugs: Secondary | ICD-10-CM | POA: Insufficient documentation

## 2020-05-21 DIAGNOSIS — K644 Residual hemorrhoidal skin tags: Secondary | ICD-10-CM | POA: Insufficient documentation

## 2020-05-21 DIAGNOSIS — Z888 Allergy status to other drugs, medicaments and biological substances status: Secondary | ICD-10-CM | POA: Diagnosis not present

## 2020-05-21 DIAGNOSIS — K529 Noninfective gastroenteritis and colitis, unspecified: Secondary | ICD-10-CM | POA: Diagnosis not present

## 2020-05-21 DIAGNOSIS — K626 Ulcer of anus and rectum: Secondary | ICD-10-CM | POA: Diagnosis not present

## 2020-05-21 DIAGNOSIS — Z79899 Other long term (current) drug therapy: Secondary | ICD-10-CM | POA: Diagnosis not present

## 2020-05-21 DIAGNOSIS — Z880 Allergy status to penicillin: Secondary | ICD-10-CM | POA: Diagnosis not present

## 2020-05-21 DIAGNOSIS — F32A Depression, unspecified: Secondary | ICD-10-CM | POA: Diagnosis not present

## 2020-05-21 HISTORY — DX: Hemorrhage, not elsewhere classified: R58

## 2020-05-21 HISTORY — DX: Irritable bowel syndrome, unspecified: K58.9

## 2020-05-21 HISTORY — PX: FLEXIBLE SIGMOIDOSCOPY: SHX5431

## 2020-05-21 LAB — GLUCOSE, CAPILLARY: Glucose-Capillary: 128 mg/dL — ABNORMAL HIGH (ref 70–99)

## 2020-05-21 SURGERY — SIGMOIDOSCOPY, FLEXIBLE
Anesthesia: General

## 2020-05-21 MED ORDER — PROPOFOL 10 MG/ML IV BOLUS
INTRAVENOUS | Status: DC | PRN
  Administered 2020-05-21: 70 mg via INTRAVENOUS

## 2020-05-21 MED ORDER — PROPOFOL 500 MG/50ML IV EMUL
INTRAVENOUS | Status: DC | PRN
  Administered 2020-05-21: 140 ug/kg/min via INTRAVENOUS

## 2020-05-21 MED ORDER — SODIUM CHLORIDE 0.9 % IV SOLN: INTRAVENOUS | Status: DC

## 2020-05-21 NOTE — Transfer of Care (Signed)
Immediate Anesthesia Transfer of Care Note  Patient: Kristin [name]  Procedure(s) Performed: FLEXIBLE SIGMOIDOSCOPY (N/A )  Patient Location: PACU  Anesthesia Type:General  Level of Consciousness: sedated  Airway & Oxygen Therapy: Patient Spontanous Breathing and Patient connected to nasal cannula oxygen  Post-op Assessment: Report given to RN and Post -op Vital signs reviewed and stable  Post vital signs: Reviewed and stable  Last Vitals:  Vitals Value Taken Time  BP 97/57 05/21/20 1203  Temp 36 C 05/21/20 1203  Pulse 67 05/21/20 1204  Resp 12 05/21/20 1204  SpO2 95 % 05/21/20 1204  Vitals shown include unvalidated device data.  Last Pain:  Vitals:   05/21/20 1203  TempSrc:   PainSc: Asleep         Complications: No complications documented.

## 2020-05-21 NOTE — Anesthesia Preprocedure Evaluation (Signed)
Anesthesia Evaluation  Patient identified by MRN, date of birth, ID band Patient awake    Reviewed: Allergy & Precautions, NPO status , Patient's Chart, lab work & pertinent test results  History of Anesthesia Complications (+) Emergence Delirium and history of anesthetic complications (Pt with struggles with mask post-op & migraines)  Airway Mallampati: III  TM Distance: >3 FB Neck ROM: limited    Dental  (+) Chipped   Pulmonary sleep apnea (prior to gastric sleeve) , neg COPD, Not current smoker,    Pulmonary exam normal        Cardiovascular hypertension, Pt. on medications (-) Past MI and (-) CHF Normal cardiovascular exam(-) dysrhythmias (-) Valvular Problems/Murmurs     Neuro/Psych  Headaches, neg Seizures Anxiety Depression    GI/Hepatic Neg liver ROS, GERD  Medicated and Poorly Controlled,  Endo/Other  diabetes  Renal/GU negative Renal ROS     Musculoskeletal  (+) Arthritis ,   Abdominal   Peds  Hematology   Anesthesia Other Findings Past Medical History: No date: Allergy No date: Arthritis No date: Basal cell carcinoma     Comment:  nose, left cheek, right upper lip, right posterior               shoulder most recent 6 years ago on left cheek No date: Bleeding No date: Chicken pox No date: Complication of anesthesia     Comment:  blood pressure elevated after gastric sleeve surgery in               recovery, pt does not want mask over face No date: COVID-19     Comment:  03/01/20 had MAB 03/03/20 and felt better next day 03/04/20 No date: Depression No date: Diabetes mellitus without complication ([facility])     Comment:  pre-diabetic currently, was diabetic prior to gastric               sleeve surgery  No date: Disorder of right rotator cuff     Comment:  10/03/19 MRI right shoulder abnormal Dr. Maurine Cane No date: Fatty liver No date: Gout 10/15/2018: Hematuria No date: History of shingles No date:  Hyperlipidemia No date: Hypertension     Comment:  no problems since losing weight after gastric sleeve               surgery No date: IBS (irritable bowel syndrome) No date: Insomnia No date: Migraine No date: Obesity No date: Osteopenia   Reproductive/Obstetrics                             Anesthesia Physical  Anesthesia Plan  ASA: III  Anesthesia Plan: General   Post-op Pain Management:    Induction: Intravenous  PONV Risk Score and Plan: 3 and Propofol infusion, TIVA and Treatment may vary due to age or medical condition  Airway Management Planned: Nasal Cannula  Additional Equipment:   Intra-op Plan:   Post-operative Plan:   Informed Consent: I have reviewed the patients History and Physical, chart, labs and discussed the procedure including the risks, benefits and alternatives for the proposed anesthesia with the patient or authorized representative who has indicated his/her understanding and acceptance.     Dental Advisory Given  Plan Discussed with: Anesthesiologist, CRNA and Surgeon  Anesthesia Plan Comments: (Patient consented for risks of anesthesia including but not limited to:  - adverse reactions to medications - risk of airway placement if required - damage to eyes, teeth,  lips or other oral mucosa - nerve damage due to positioning  - sore throat or hoarseness - Damage to heart, brain, nerves, lungs, other parts of body or loss of life  Patient voiced understanding.)        Anesthesia Quick Evaluation

## 2020-05-21 NOTE — H&P (Signed)
Cephas Darby, MD [address]  [place], [place] [address]  Main: [phone]  Fax: [phone] Pager: [phone]  Primary Care Physician:  McLean-Scocuzza, Nino Glow, MD Primary Gastroenterologist:  Dr. Cephas Darby  Pre-Procedure History & Physical: HPI:  Kristin [name] is a 60 y.o. female is here for an flexible sigmoidoscopy.   Past Medical History:  Diagnosis Date  . Allergy   . Arthritis   . Basal cell carcinoma    nose, left cheek, right upper lip, right posterior shoulder most recent 6 years ago on left cheek  . Bleeding   . Chicken pox   . Complication of anesthesia    blood pressure elevated after gastric sleeve surgery in recovery, pt does not want mask over face  . COVID-19    03/01/20 had MAB 03/03/20 and felt better next day 03/04/20  . Depression   . Diabetes mellitus without complication ([place])    pre-diabetic currently, was diabetic prior to gastric sleeve surgery   . Disorder of right rotator cuff    10/03/19 MRI right shoulder abnormal Dr. Maurine Cane  . Fatty liver   . Gout   . Hematuria 10/15/2018  . History of shingles   . Hyperlipidemia   . Hypertension    no problems since losing weight after gastric sleeve surgery  . IBS (irritable bowel syndrome)   . Insomnia   . Migraine   . Obesity   . Osteopenia     Past Surgical History:  Procedure Laterality Date  . ABDOMINAL HYSTERECTOMY     no h/o abnormal pap still has 1 ovary left  . ANKLE ARTHROSCOPY Left 05/29/2016   Procedure: ANKLE ARTHROSCOPY;  Surgeon: [name], DPM;  Location: [facility];  Service: Podiatry;  Laterality: Left;  . APPENDECTOMY    . basal cell removed    . [place]  . CHOLECYSTECTOMY    . COLONOSCOPY WITH PROPOFOL N/A 01/31/2019   Procedure: COLONOSCOPY WITH PROPOFOL;  Surgeon: Lin Landsman, MD;  Location[facility];  Service: Gastroenterology;  Laterality: N/A;  . cyst removed from wrist    .  ESOPHAGOGASTRODUODENOSCOPY (EGD) WITH PROPOFOL N/A 01/31/2019   Procedure: ESOPHAGOGASTRODUODENOSCOPY (EGD) WITH PROPOFOL;  Surgeon: Lin Landsman, MD;  Location: [facility] [place];  Service: Gastroenterology;  Laterality: N/A;  . FOOT SURGERY     left foot 11/2016 Dr. Laymond Purser   . left elbow surgery     07/2017 Dr Leanor Kail epicondylitis   . MOHS SURGERY     x 2 face nose and left cheek   . OSTEOCHONDROMA EXCISION Left 05/29/2016   Procedure: OSTEOCHONDRAL DRILLING TALUS;  Surgeon: [name], DPM;  Location: [facility];  Service: Podiatry;  Laterality: Left;  . PLANTAR FASCIA RELEASE Left 05/29/2016   Procedure: ENDOSCOPIC PLANTAR FASCIOTOMY;  Surgeon: [name], DPM;  Location: [facility];  Service: Podiatry;  Laterality: Left;  . ROTATOR CUFF REPAIR     ? right likely shoulder  . STOMACH SURGERY  2017   gastric sleeve surgery  . TENNIS ELBOW RELEASE/NIRSCHEL PROCEDURE Left 08/05/2017   Procedure: TENNIS ELBOW RELEASE/NIRSCHEL PROCEDURE;  Surgeon: Leanor Kail, MD;  Location: [facility];  Service: Orthopedics;  Laterality: Left;  . TUBAL LIGATION    . wedge resection of ovary  1982    Prior to Admission medications   Medication Sig Start Date End Date Taking? Authorizing Provider  albuterol (VENTOLIN HFA) 108 (90 Base)  MCG/ACT inhaler Inhale 1-2 puffs into the lungs every 6 (six) hours as needed for wheezing or shortness of breath. 03/01/20   McLean-Scocuzza, Nino Glow, MD  amitriptyline (ELAVIL) 25 MG tablet TAKE 1 TABLET (25 MG TOTAL) BY MOUTH AT BEDTIME. INCREASE TO 50MG  AFTER 2 WEEKS IF NEEDED 11/10/19 11/04/20  Lin Landsman, MD  amLODipine (NORVASC) 5 MG tablet Take 1 tablet (5 mg total) by mouth daily. And stop 2.5 mg daily (2 pills) 10/24/19   McLean-Scocuzza, Nino Glow, MD  Cholecalciferol (DIALYVITE VITAMIN D 5000 PO) Take 5,000 Units by mouth every other day.    [provider]  fexofenadine (ALLEGRA) 180 MG tablet Take 1 tablet (180 mg total) by mouth daily  as needed for allergies or rhinitis. 08/31/19   McLean-Scocuzza, Nino Glow, MD  hydrocortisone (ANUSOL-HC) 2.5 % rectal cream Place 1 application rectally 2 (two) times daily. 05/07/20   Lin Landsman, MD  hydrOXYzine (ATARAX/VISTARIL) 25 MG tablet TAKE 1 TABLET BY MOUTH 2 TIMES DAILY AS NEEDED. 04/23/20   McLean-Scocuzza, Nino Glow, MD  metFORMIN (GLUCOPHAGE) 500 MG tablet Take 1 tablet (500 mg total) by mouth 2 (two) times daily with a meal. 03/01/20   McLean-Scocuzza, Nino Glow, MD  metroNIDAZOLE (METROCREAM) 0.75 % cream Apply to cheeks and nose every night for rosacea. 03/20/20   Brendolyn Patty, MD  montelukast (SINGULAIR) 10 MG tablet Take 1 tablet (10 mg total) by mouth at bedtime. 03/01/20   McLean-Scocuzza, Nino Glow, MD  mupirocin ointment (BACTROBAN) 2 % Apply 1 application topically 2 (two) times daily. 03/28/20   McLean-Scocuzza, Nino Glow, MD  omeprazole (PRILOSEC) 40 MG capsule TAKE 1 CAPSULE (40 MG TOTAL) BY MOUTH DAILY BEFORE SUPPER. 04/02/20   Inesha Sow, Tally Due, MD  ondansetron (ZOFRAN-ODT) 4 MG disintegrating tablet Take 1 tablet (4 mg total) by mouth every 8 (eight) hours as needed. for nausea 03/01/20   McLean-Scocuzza, Nino Glow, MD  potassium chloride (MICRO-K) 10 MEQ CR capsule Take 2 capsules (20 mEq total) by mouth daily. 04/16/20   McLean-Scocuzza, Nino Glow, MD  Rimegepant Sulfate (NURTEC) 75 MG TBDP Take 75 mg by mouth daily as needed (take for abortive therapy of migraine, no more than 1 tablet in 24 hours or 10 per month). 02/17/19   Lomax, Amy, NP  rosuvastatin (CRESTOR) 20 MG tablet Take 1 tablet (20 mg total) by mouth every Monday, Wednesday, and Friday. 04/06/20   McLean-Scocuzza, Nino Glow, MD  topiramate (TOPAMAX) 50 MG tablet Take 1 tablet (50 mg total) by mouth 2 (two) times daily. 11/22/19   Lomax, Amy, NP  triamcinolone (KENALOG) 0.1 % Apply 1 application topically 2 (two) times daily. Prn itching hives on body not face 03/28/20   McLean-Scocuzza, Nino Glow, MD   triamterene-hydrochlorothiazide (MAXZIDE-25) 37.5-25 MG tablet Take 0.5 tablets by mouth daily. In the am 05/01/20   McLean-Scocuzza, Nino Glow, MD  zolpidem (AMBIEN CR) 12.5 MG CR tablet Take 1 tablet (12.5 mg total) by mouth at bedtime as needed. 02/07/20   McLean-Scocuzza, Nino Glow, MD    Allergies as of 05/08/2020 - Review Complete 05/08/2020  Allergen Reaction Noted  . Tetracyclines & related Swelling and Rash 06/04/2014  . Amoxicillin  12/10/2018  . Alcohol Rash 12/07/2014  . Azithromycin Rash 07/19/2018  . Metoprolol Diarrhea 12/07/2014    Family History  Problem Relation Age of Onset  . Heart disease Mother   . Breast cancer Mother 36       metastatic, now hip, spine, and leg   .  Arthritis Mother   . Hyperlipidemia Mother   . Cancer Mother        spinal surgery. breast dx'ed age 14 now metastatic 19 as of 07/13/18  . Cystic fibrosis Mother        carrier   . Heart disease Father   . Lung cancer Father        smoker  . Hyperlipidemia Father   . Hypertension Father   . Diabetes Sister   . Stroke Sister   . Other Sister        covid +  . Diabetes Maternal Aunt   . Diabetes Maternal Uncle   . Diabetes Maternal Grandfather   . Hyperlipidemia Maternal Grandfather   . Arthritis Maternal Grandmother   . Sudden death Cousin   . Diabetes Maternal Aunt   . Diabetes Sister   . Cystic fibrosis Other        great niece   . Colon cancer Neg Hx   . Ovarian cancer Neg Hx     Social History   Socioeconomic History  . Marital status: Married    Spouse name: Not on file  . Number of children: 3  . Years of education: Not on file  . Highest education level: Some college, no degree  Occupational History  . Not on file  Tobacco Use  . Smoking status: Never Smoker  . Smokeless tobacco: Never Used  Vaping Use  . Vaping Use: Never used  Substance and Sexual Activity  . Alcohol use: Never  . Drug use: Never  . Sexual activity: Yes    Partners: Male    Birth  control/protection: Surgical  Other Topics Concern  . Not on file  Social History Narrative   Married x 37 years as of 10/2017    Kids 3 boys youngest is Eddie Dibbles   Write childrens books    Retired from [employer] 01/2017    Enjoys traveling    Right handed   Caffeine: quit 2009   Social Determinants of Health   Financial Resource Strain: Not on file  Food Insecurity: Not on file  Transportation Needs: Not on file  Physical Activity: Not on file  Stress: Not on file  Social Connections: Not on file  Intimate Partner Violence: Not on file    Review of Systems: See HPI, otherwise negative ROS  Physical Exam: BP (!) 151/88   Pulse 92   Temp (!) 96.4 F (35.8 C) (Temporal)   Resp 18   Ht 5\' 3"  (1.6 m)   Wt 79.8 kg   SpO2 100%   BMI 31.18 kg/m  General:   Alert,  pleasant and cooperative in NAD Head:  Normocephalic and atraumatic. Neck:  Supple; no masses or thyromegaly. Lungs:  Clear throughout to auscultation.    Heart:  Regular rate and rhythm. Abdomen:  Soft, nontender and nondistended. Normal bowel sounds, without guarding, and without rebound.   Neurologic:  Alert and  oriented x4;  grossly normal neurologically.  Impression/Plan: Kristin [name] is here for an flexible sigmoidoscopy to be performed for rectal bleeding  Risks, benefits, limitations, and alternatives regarding  flexible sigmoidoscopy have been reviewed with the patient.  Questions have been answered.  All parties agreeable.   Sherri Sear, MD  05/21/2020, 11:37 AM

## 2020-05-21 NOTE — Op Note (Signed)
[facility] Gastroenterology Patient Name: Kristin [name] Procedure Date: 05/21/2020 11:31 AM MRN: [id number] Account #: [id number] Date of Birth: 1960-05-16 Admit Type: Outpatient Age: 60 Room: [facility] ENDO ROOM 1 Gender: Female Note Status: Finalized Procedure:             Flexible Sigmoidoscopy Indications:           Rectal hemorrhage, Chronic diarrhea Providers:             Lin Landsman MD, MD Referring MD:          Nino Glow Mclean-Scocuzza MD, MD (Referring MD) Medicines:             General Anesthesia Complications:         No immediate complications. Estimated blood loss: None. Procedure:             Pre-Anesthesia Assessment:                        - Prior to the procedure, a History and Physical was                         performed, and patient medications and allergies were                         reviewed. The patient is competent. The risks and                         benefits of the procedure and the sedation options and                         risks were discussed with the patient. All questions                         were answered and informed consent was obtained.                         Patient identification and proposed procedure were                         verified by the physician, the nurse, the                         anesthesiologist, the anesthetist and the technician                         in the pre-procedure area in the procedure room in the                         endoscopy suite. Mental Status Examination: alert and                         oriented. Airway Examination: normal oropharyngeal                         airway and neck mobility. Respiratory Examination:                         clear to auscultation. CV Examination: normal.  Prophylactic Antibiotics: The patient does not require                         prophylactic antibiotics. Prior Anticoagulants: The                         patient has taken  no previous anticoagulant or                         antiplatelet agents. ASA Grade Assessment: III - A                         patient with severe systemic disease. After reviewing                         the risks and benefits, the patient was deemed in                         satisfactory condition to undergo the procedure. The                         anesthesia plan was to use general anesthesia.                         Immediately prior to administration of medications,                         the patient was re-assessed for adequacy to receive                         sedatives. The heart rate, respiratory rate, oxygen                         saturations, blood pressure, adequacy of pulmonary                         ventilation, and response to care were monitored                         throughout the procedure. The physical status of the                         patient was re-assessed after the procedure.                        After obtaining informed consent, the scope was passed                         under direct vision. The Colonoscope was introduced                         through the anus and advanced to the the left                         transverse colon. The flexible sigmoidoscopy was                         accomplished without difficulty. The patient tolerated  the procedure well. The quality of the bowel                         preparation was adequate. Findings:      The perianal and digital rectal examinations were normal. Pertinent       negatives include normal sphincter tone and no palpable rectal lesions.      Normal mucosa was found in the entire examined colon.      A single (solitary) post banding ulcer was found in the distal rectum.       No bleeding was present. No stigmata of recent bleeding were seen.      Non-bleeding external hemorrhoids were found during retroflexion. The       hemorrhoids were medium-sized. Impression:             - Normal mucosa in the entire examined colon.                        - A single (solitary) ulcer in the distal rectum.                        - Non-bleeding external hemorrhoids.                        - No specimens collected. Recommendation:        - Discharge patient to home (with escort).                        - Resume previous diet today.                        - Await pathology results.                        - Return to my office as previously scheduled. Procedure Code(s):     --- Professional ---                        [phone], Sigmoidoscopy, flexible; diagnostic, including                         collection of specimen(s) by brushing or washing, when                         performed (separate procedure) Diagnosis Code(s):     --- Professional ---                        K64.4, Residual hemorrhoidal skin tags                        K62.6, Ulcer of anus and rectum                        K62.5, Hemorrhage of anus and rectum                        K52.9, Noninfective gastroenteritis and colitis,                         unspecified CPT copyright 2019 American Medical Association. All rights reserved. The codes documented  in this report are preliminary and upon coder review may  be revised to meet current compliance requirements. Dr. Ulyess Mort Lin Landsman MD, MD 05/21/2020 12:01:46 PM This report has been signed electronically. Number of Addenda: 0 Note Initiated On: 05/21/2020 11:31 AM Total Procedure Duration: 0 hours 11 minutes 40 seconds  Estimated Blood Loss:  Estimated blood loss: none.      [facility]

## 2020-05-21 NOTE — Anesthesia Postprocedure Evaluation (Signed)
Anesthesia Post Note  Patient: Marcedes Tech Baldonado  Procedure(s) Performed: FLEXIBLE SIGMOIDOSCOPY (N/A )  Patient location during evaluation: Endoscopy Anesthesia Type: General Level of consciousness: awake and alert Pain management: pain level controlled Vital Signs Assessment: post-procedure vital signs reviewed and stable Respiratory status: spontaneous breathing, nonlabored ventilation, respiratory function stable and patient connected to nasal cannula oxygen Cardiovascular status: blood pressure returned to baseline and stable Postop Assessment: no apparent nausea or vomiting Anesthetic complications: no   No complications documented.   Last Vitals:  Vitals:   05/21/20 1203 05/21/20 1233  BP: (!) 97/57 (!) 143/74  Pulse:    Resp:    Temp: (!) 36 C   SpO2:      Last Pain:  Vitals:   05/21/20 1233  TempSrc:   PainSc: 0-No pain                 Precious Haws Justin Meisenheimer

## 2020-05-22 ENCOUNTER — Encounter: Payer: Self-pay | Admitting: Gastroenterology

## 2020-05-22 LAB — SURGICAL PATHOLOGY

## 2020-05-23 NOTE — Progress Notes (Signed)
PROCEDURE NOTE: The patient presents with symptomatic grade 2 hemorrhoids, unresponsive to maximal medical therapy, requesting rubber band ligation of his/her hemorrhoidal disease.  All risks, benefits and alternative forms of therapy were described and informed consent was obtained.  In the Left Lateral Decubitus position (if anoscopy is performed) anoscopic examination revealed grade 2 hemorrhoids in the all position(s).   The decision was made to band the RP internal hemorrhoid, and the Hissop was used to perform band ligation without complication.  Digital anorectal examination was then performed to assure proper positioning of the band, and to adjust the banded tissue as required.  The patient was discharged home without pain or other issues.  Dietary and behavioral recommendations were given and (if necessary - prescriptions were given), along with follow-up instructions.  The patient will return 4 weeks for follow-up and possible additional banding as required.  No complications were encountered and the patient tolerated the procedure well.

## 2020-05-28 ENCOUNTER — Ambulatory Visit: Payer: BC Managed Care – PPO | Admitting: Pharmacist

## 2020-05-28 DIAGNOSIS — E785 Hyperlipidemia, unspecified: Secondary | ICD-10-CM

## 2020-05-28 DIAGNOSIS — E1165 Type 2 diabetes mellitus with hyperglycemia: Secondary | ICD-10-CM

## 2020-05-28 DIAGNOSIS — I10 Essential (primary) hypertension: Secondary | ICD-10-CM

## 2020-05-28 DIAGNOSIS — F419 Anxiety disorder, unspecified: Secondary | ICD-10-CM

## 2020-05-28 DIAGNOSIS — G43711 Chronic migraine without aura, intractable, with status migrainosus: Secondary | ICD-10-CM

## 2020-05-28 DIAGNOSIS — F5101 Primary insomnia: Secondary | ICD-10-CM

## 2020-05-28 MED ORDER — EMPAGLIFLOZIN 10 MG PO TABS: 10.0000 mg | ORAL_TABLET | Freq: Every day | ORAL | 2 refills | Status: DC

## 2020-05-28 MED ORDER — FREESTYLE LIBRE 2 SENSOR MISC: 11 refills | Status: DC

## 2020-05-28 NOTE — Chronic Care Management (AMB) (Signed)
Care Management   Pharmacy Note  05/28/2020 Name: Kristin [name] MRN: [id number] DOB: [birth year]  Subjective: Kristin [name] is a 60 y.o. year old female who is a primary care patient of McLean-Scocuzza, Nino Glow, MD. The Care Management team was consulted for assistance with care management and care coordination needs.    Engaged with patient by telephone for initial visit in response to provider referral for pharmacy case management and/or care coordination services.   The patient was given information about Care Management services today including:  1. Care Management services includes personalized support from designated clinical staff supervised by the patient's primary care provider, including individualized plan of care and coordination with other care providers. 2. 24/7 contact phone numbers for assistance for urgent and routine care needs. 3. The patient may stop case management services at any time by phone call to the office staff.  Patient agreed to services and consent obtained.  Assessment:  Review of patient status, including review of consultants reports, laboratory and other test data, was performed as part of comprehensive evaluation and provision of chronic care management services.   SDOH (Social Determinants of Health) assessments and interventions performed:    Objective:  Lab Results  Component Value Date   CREATININE 0.97 05/03/2020   CREATININE 1.04 04/23/2020   CREATININE 1.09 04/05/2020    Lab Results  Component Value Date   HGBA1C 8.3 (H) 04/05/2020       Component Value Date/Time   CHOL 142 04/05/2020 1205   CHOL 147 05/05/2019 0838   TRIG 173.0 (H) 04/05/2020 1205   HDL 48.80 04/05/2020 1205   HDL 58 05/05/2019 0838   CHOLHDL 3 04/05/2020 1205   VLDL 34.6 04/05/2020 1205   LDLCALC 59 04/05/2020 1205   LDLCALC 62 05/05/2019 0838    Clinical ASCVD: No  The 10-year ASCVD risk score Mikey Bussing DC Jr., et al., 2013) is: 8.7%   Values  used to calculate the score:     Age: 76 years     Sex: Female     Is Non-Hispanic African American: No     Diabetic: Yes     Tobacco smoker: No     Systolic Blood Pressure: 284 mmHg     Is BP treated: Yes     HDL Cholesterol: 48.8 mg/dL     Total Cholesterol: 142 mg/dL      BP Readings from Last 3 Encounters:  05/21/20 (!) 143/74  05/08/20 137/84  05/03/20 120/70    Care Plan  Allergies  Allergen Reactions  . Tetracyclines & Related Swelling and Rash    FACE SWELL, RASH ON CHEST  . Amoxicillin   . Alcohol Rash    ABDOMINAL CRAMPS, drinking alcohol  . Azithromycin Rash  . Metoprolol Diarrhea    Medications Reviewed Today    Reviewed by De Hollingshead, RPH-CPP (Pharmacist) on 05/28/20 at 65  Med List Status: <None>  Medication Order Taking? Sig Documenting Provider Last Dose Status Informant  albuterol (VENTOLIN HFA) 108 (90 Base) MCG/ACT inhaler [id number] No Inhale 1-2 puffs into the lungs every 6 (six) hours as needed for wheezing or shortness of breath.  Patient not taking: Reported on 05/28/2020   McLean-Scocuzza, Nino Glow, MD Not Taking Active   amitriptyline (ELAVIL) 25 MG tablet [id number] Yes TAKE 1 TABLET (25 MG TOTAL) BY MOUTH AT BEDTIME. INCREASE TO 50MG  AFTER 2 WEEKS IF NEEDED Vanga, Tally Due, MD Taking Active   amLODipine (NORVASC) 5 MG tablet [id number] Yes Take 1 tablet (  5 mg total) by mouth daily. And stop 2.5 mg daily (2 pills) McLean-Scocuzza, Nino Glow, MD Taking Active   Cholecalciferol (DIALYVITE VITAMIN D 5000 PO) [id number] Yes Take 5,000 Units by mouth every other day. [provider] Taking Active   dicyclomine (BENTYL) 20 MG tablet [id number] Yes Take 20 mg by mouth 2 (two) times daily as needed for spasms. [provider] Taking Active   fexofenadine (ALLEGRA) 180 MG tablet [id number] Yes Take 1 tablet (180 mg total) by mouth daily as needed for allergies or rhinitis. McLean-Scocuzza, Nino Glow, MD Taking Active   hydrocortisone  (ANUSOL-HC) 2.5 % rectal cream [id number] No Place 1 application rectally 2 (two) times daily.  Patient not taking: No sig reported   Lin Landsman, MD Not Taking Active   hydrOXYzine (ATARAX/VISTARIL) 25 MG tablet [id number] No TAKE 1 TABLET BY MOUTH 2 TIMES DAILY AS NEEDED.  Patient not taking: Reported on 05/28/2020   McLean-Scocuzza, Nino Glow, MD Not Taking Active   metFORMIN (GLUCOPHAGE) 500 MG tablet [id number] No Take 1 tablet (500 mg total) by mouth 2 (two) times daily with a meal.  Patient not taking: Reported on 05/28/2020   McLean-Scocuzza, Nino Glow, MD Not Taking Active   metroNIDAZOLE (METROCREAM) 0.75 % cream [id number] Yes Apply to cheeks and nose every night for rosacea. Brendolyn Patty, MD Taking Active   montelukast (SINGULAIR) 10 MG tablet [id number] Yes Take 1 tablet (10 mg total) by mouth at bedtime. McLean-Scocuzza, Nino Glow, MD Taking Active   Multiple Vitamin (MULTIVITAMIN) tablet [id number] Yes Take 1 tablet by mouth daily. [provider] Taking Active   omeprazole (PRILOSEC) 40 MG capsule [id number] Yes TAKE 1 CAPSULE (40 MG TOTAL) BY MOUTH DAILY BEFORE SUPPER. Lin Landsman, MD Taking Active   ondansetron (ZOFRAN-ODT) 4 MG disintegrating tablet [id number] No Take 1 tablet (4 mg total) by mouth every 8 (eight) hours as needed. for nausea  Patient not taking: Reported on 05/28/2020   McLean-Scocuzza, Nino Glow, MD Not Taking Active   potassium chloride (MICRO-K) 10 MEQ CR capsule [id number] Yes Take 2 capsules (20 mEq total) by mouth daily. McLean-Scocuzza, Nino Glow, MD Taking Active   Rimegepant Sulfate (NURTEC) 75 MG TBDP [id number] Yes Take 75 mg by mouth daily as needed (take for abortive therapy of migraine, no more than 1 tablet in 24 hours or 10 per month). Lomax, Amy, NP Taking Active   rosuvastatin (CRESTOR) 20 MG tablet [id number] Yes Take 1 tablet (20 mg total) by mouth every Monday, Wednesday, and Friday. McLean-Scocuzza, Nino Glow, MD Taking Active   tiZANidine  (ZANAFLEX) 2 MG tablet [id number] Yes Take 2 mg by mouth daily as needed for muscle spasms. [provider] Taking Active   topiramate (TOPAMAX) 50 MG tablet [id number] Yes Take 1 tablet (50 mg total) by mouth 2 (two) times daily. Debbora Presto, NP Taking Active            Med Note CYNDI, DEWOLF May 28, 2020  1:06 PM) Taking once daily QAM  triamcinolone (KENALOG) 0.1 % [id number] Yes Apply 1 application topically 2 (two) times daily. Prn itching hives on body not face McLean-Scocuzza, Nino Glow, MD Taking Active   triamterene-hydrochlorothiazide (MAXZIDE-25) 37.5-25 MG tablet [id number] Yes Take 0.5 tablets by mouth daily. In the am McLean-Scocuzza, Nino Glow, MD Taking Active   zolpidem (AMBIEN CR) 12.5 MG CR tablet [id number] Yes Take 1 tablet (12.5 mg total) by mouth at bedtime as needed. McLean-Scocuzza, Nino Glow, MD Taking  Active           Patient Active Problem List   Diagnosis Date Noted  . Rectal bleeding 05/03/2020  . CTS (carpal tunnel syndrome) 04/05/2020  . Stress 03/28/2020  . Hives 03/28/2020  . Annual physical exam 08/19/2019  . Hypertension associated with diabetes ([place]) 08/18/2019  . Obesity, diabetes, and hypertension syndrome ([place]) 08/18/2019  . Irritable bowel syndrome 03/01/2019  . Chronic diarrhea   . DM2 (diabetes mellitus, type 2) ([place]) 08/31/2018  . Vitamin D deficiency 08/31/2018  . Chronic migraine without aura, with intractable migraine, so stated, with status migrainosus 11/10/2017  . Chronic intractable headache 10/20/2017  . Obesity (BMI 30-39.9) 05/06/2017  . Fatty liver 05/06/2017  . Injury of right wrist 06/26/2016  . Hyperlipidemia 12/12/2015  . Anxiety 12/12/2015  . Obstructive sleep apnea syndrome 10/10/2014  . Gastroesophageal reflux disease with hiatal hernia 09/29/2014  . Morbid obesity ([place]) 09/29/2014  . Benign essential hypertension 08/14/2014  . Palpitations 08/14/2014  . Hypertension 07/17/2014  . Insomnia 07/17/2014     Conditions to be addressed/monitored: HTN, HLD, DMII and Anxiety  Care Plan : PharmD - Medication Management  Updates made by De Hollingshead, RPH-CPP since 05/28/2020 12:00 AM    Problem: Diabetes, IBS-D, HLD, Anxiety     Long-Range Goal: Disease Progression Prevention   Start Date: 05/28/2020  This Visit's Progress: On track  Priority: High  Note:   Current Barriers:  . Unable to achieve control of diabetes   Pharmacist Clinical Goal(s):  Marland Kitchen Over the next 90 days, patient will achieve control of diabetes as evidenced by A1c through collaboration with PharmD and provider.   Interventions: . 1:1 collaboration with McLean-Scocuzza, Nino Glow, MD regarding development and update of comprehensive plan of care as evidenced by provider attestation and co-signature . Inter-disciplinary care team collaboration (see longitudinal plan of care) . Comprehensive medication review performed; medication list updated in electronic medical record  SDOH: . Mother of 3 sons. Splits time between here and [place] caring for her ailing mother. Sister in [place] is a Marine scientist.  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   Diabetes: . Uncontrolled; current treatment: metformin XR 500 mg BID, but is not taking often. She has only been taking when she eats a big meal . Current glucose readings: not checking, does not have a glucometer . Current meal patterns: breakfast: piece of fruit; lunch: dinner leftovers; sometimes Healthy Choice frozen meals; dinner: lean proteins, chicken, pork; peas, corn, broccoli, squash, snacks: oranges, apples, nuts, drinks: decaf tea w/ artificial sweetener, and has cut back on how much tea she puts on  . Current exercise: not very active, plays with her puppy. Does not walk. Tries to hit 6000 steps daily (pacing during work). . Educated on goal A1c, goal fasting, and goal 2 hour post prandial readings . Extensive education regarding dietary choices. Discussed reducing carbohydrate  portion sizes. Will send education via [place] . Discussed appropriate administration of metformin. Patient was unaware that she was supposed to take regularly, even if she isn't eating a meal. Discussed that it would not cause hypoglycemia. She notes that she has diarrhea 90% of the time after she eats, whether she took metformin or not.  Marland Kitchen Extensive education on SGLT2 inhibitor. Discussed mechanism, side effects, use of manufacturer copay card. Patient elects to start SGLT2 vs retrial of metformin at this time. However, I believe re-trial of metformin in the future, with extended release formulation, remains an appropriate possibility.  . Start [place]  10 mg daily. Advised to take in the morning, advised on sick day instructions.  . Discussed need to check glucose readings. Patient notes she does not like finger sticks. Will see if insurance covers Chancellor 2 sensors (she notes she has a sister that wears a CGM). Will set up for clinic nurse education if patient does plan to use CGM.   Hypertension w/ hx hypokalemia: . Controlled; current treatment: triamterene/HCTZ 37.5/25 mg 1/2 tab daily, amlodipine 5 mg daily QAM - plans to move to QPM for improved adherence; potassium 20 mEq daily . Reports leg cramps are significantly improved with potassium supplementation . Current home readings: did not discuss today . Will discuss home BP readings at future visits. Recommend to continue current regimen at this time  Hyperlipidemia: . Uncontrolled; current treatment: rosuvastatin 20 mg every other day . Notes she had improvement in leg cramps since reducing rosuvastatin, but also repleted potassium during that time. . Discussed that muscle aches/pains are usually more associated with statin associated muscle symptoms. Recommend trial of increasing rosuvastatin back to daily administration in the future.  . Continue current regimen at this time  Asthma/Allergies: . Exacerbated by pollen; current treatment:  montelukast 10 mg daily (could not find bottle today, plans to find); fexofenadine 180 mg daily, hydroxyzine 25 mg PRN hives, triamcinolone 0.1% cream PRN hives, related to changing environments, stress from caring for her mother; no need for albuterol HFA since she recovered from [place].  Marland Kitchen Recommend to continue current regimen at this time. Restart montelukast  Insomnia/Anxiety/Neuropathy . Controlled, but with high risk medications as patient ages; current regimen: zolpidem 12.5 mg QPM, amitriptyline 25 mg QPM. . Script for tizanidine 2 mg PRN muscle spasms, reports she takes for severe leg cramps, but this has improved since potassium supplementation was added  o Hx fluoxetine; hx diazepam, temazepam, trazodone, nortriptyline.  . Referral to neurology, Dr. Manuella Ghazi, in place.  . Follow for safety of amitriptyline and zolpidem therapy, especially in combination with topiramate. Patient already noting that she will forget things that she doesn't write down, forget to take her medications, etc.   Migraines: . Improved control; current regimen: topiramate 50 mg QAM; concurrent amitriptyline, Nurtec 75 mg PRN; onransetron 4 mg PRN nausea o Hx Emgality - did not like giving injections o Reduced topiramate to once daily due to mental fog.  Marland Kitchen Recommend to continue current regimen at this time along with neurology collaboration  IBS-D/Hemorrhoids: . Improved; current regimen: dicyclomine 20 mg BID PRN- though did not derive much benefit from this; hydrocortisone 2.5% rectal - though has not needed since last ligation; omeprazole 40 mg daily,  . Appointment next week with Dr. Marius Ditch for f/u. Encouraged to keep this appointment and discuss diarrhea.   Patient Goals/Self-Care Activities . Over the next 90 days, patient will:  - take medications as prescribed check glucose 2-3 times daily using CGM, document, and provide at future appointments engage in dietary modifications by reducing carbohydrate portion  sizes  Follow Up Plan: Telephone follow up appointment with care management team member scheduled for: ~10 weeks (PCP f/u in ~ 6 weeks)     Medication Assistance:  None required.  Patient affirms current coverage meets needs.  Follow Up:  Patient agrees to Care Plan and Follow-up.  Plan: Telephone follow up appointment with care management team member scheduled for:  ~ 10 weeks  Catie Darnelle Maffucci, PharmD, [place], [place] Clinical Pharmacist [employer] [facility] [employer] [phone]

## 2020-05-28 NOTE — Patient Instructions (Signed)
Visit Information  PATIENT GOALS:  Goals Addressed              This Visit's Progress     Patient Stated   .  Medication Monitoring (pt-stated)        Patient Goals/Self-Care Activities . Over the next 90 days, patient will:  - take medications as prescribed check glucose 2-3 times daily using CGM, document, and provide at future appointments engage in dietary modifications by reducing carbohydrate portion sizes       Kristin [name] was given information about Care Management services by the embedded care coordination team incl2uding:  1. Care Management services include personalized support from designated clinical staff supervised by her physician, including individualized plan of care and coordination with other care providers 2. 24/7 contact phone numbers for assistance for urgent and routine care needs. 3. The patient may stop CCM services at any time (effective at the end of the month) by phone call to the office staff.  Patient agreed to services and verbal consent obtained.   Patient verbalizes understanding of instructions provided today and agrees to view in [place].   Plan: Telephone follow up appointment with care management team member scheduled for:  ~ 10 weeks  Catie Darnelle Maffucci, PharmD, [place], [place] Clinical Pharmacist [employer] [facility] [employer] [phone]

## 2020-06-04 ENCOUNTER — Telehealth: Payer: Self-pay | Admitting: Internal Medicine

## 2020-06-04 ENCOUNTER — Other Ambulatory Visit: Payer: Self-pay | Admitting: Internal Medicine

## 2020-06-04 DIAGNOSIS — B373 Candidiasis of vulva and vagina: Secondary | ICD-10-CM

## 2020-06-04 DIAGNOSIS — B3731 Acute candidiasis of vulva and vagina: Secondary | ICD-10-CM

## 2020-06-04 LAB — HM DIABETES EYE EXAM

## 2020-06-04 MED ORDER — FLUCONAZOLE 150 MG PO TABS: 150.0000 mg | ORAL_TABLET | Freq: Once | ORAL | 0 refills | Status: AC

## 2020-06-04 NOTE — Telephone Encounter (Signed)
Patient informed and verbalized understanding

## 2020-06-04 NOTE — Telephone Encounter (Signed)
Please advise 

## 2020-06-04 NOTE — Telephone Encounter (Signed)
It appears the patients PCP already sent this in.

## 2020-06-04 NOTE — Telephone Encounter (Signed)
Patient called and wanted Dr Olivia Mackie to know she is taking [place] , she now has a yeast infection from taking medication. She would like something for it.

## 2020-06-05 ENCOUNTER — Ambulatory Visit (INDEPENDENT_AMBULATORY_CARE_PROVIDER_SITE_OTHER): Payer: BC Managed Care – PPO | Admitting: Gastroenterology

## 2020-06-05 ENCOUNTER — Other Ambulatory Visit: Payer: Self-pay

## 2020-06-05 ENCOUNTER — Encounter: Payer: Self-pay | Admitting: Gastroenterology

## 2020-06-05 VITALS — BP 117/83 | HR 86 | Temp 97.7°F | Ht 63.0 in | Wt 175.4 lb

## 2020-06-05 DIAGNOSIS — K529 Noninfective gastroenteritis and colitis, unspecified: Secondary | ICD-10-CM | POA: Diagnosis not present

## 2020-06-05 NOTE — Progress Notes (Signed)
Cephas Darby, MD [address]  [place]  [place], [place] [address]  Main: [phone]  Fax: [phone]    Gastroenterology Consultation  Referring Provider:     McLean-Scocuzza, Olivia Mackie * Primary Care Physician:  McLean-Scocuzza, Nino Glow, MD Primary Gastroenterologist:  Dr. Cephas Darby Reason for Consultation: Chronic diarrhea, rectal bleeding        HPI:   Kristin [name] is a 60 y.o. female referred by Dr. Terese Door, Nino Glow, MD  for consultation & management of chronic diarrhea.  Patient reports that she started experiencing nonbloody watery bowel movements since beginning of October, primarily postprandial abdominal cramps in the left mid quadrant followed by a BM.  This has restricted her travel to [place] to take care of her mother.  She underwent work-up including stool studies to rule out infection and came back negative.  She stopped Celebrex thinking that it may have caused diarrhea but unchanged.  She denies any new medications 3 months.  She does have mild diabetes.  TSH is normal.  No evidence of anemia, normal CMP.  Also, limited intake of food due to postprandial urgency that has resulted in few pounds of weight loss.  She is currently taking Pepto-Bismol and Imodium as needed which is controlling her diarrhea. She also reports chronic intermittent heartburn for which she takes omeprazole 20 mg at bedtime. She denies nocturnal heartburn She does have chronic migraine and currently on multiple medications, followed by neurology  She used to work at [employer].  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  She had started experiencing bright red blood per rectum with rectal pressure/swelling on 3/30 which lasted for several days, it was bright red blood per rectum associated with clots.  She reports that her bowel movements remain  alternating between constipation and diarrhea within a week itself.  For last 2 days she noticed recurrence of rectal bleeding again.  Patient reports that she made significant changes in her dietary habits, lost more than 10 pounds since last visit.  She does report lower abdominal cramps during bleeding episodes.  Her hemoglobin was normal on 4/14.  She had an upper endoscopy and colonoscopy in 1/21 which were unremarkable  Follow-up visit 06/05/2020 Patient is here for follow-up of chronic diarrhea and rectal bleeding.  Her rectal bleeding has currently resolved.  Patient experienced rectal discomfort post banding for about 30 hours and she is hesitant to undergo repeat banding today.  Also, her bleeding has not recurred since her last banding.  She has ongoing diarrhea and 1 or 2 days of constipation but diarrhea is her main concern associated with abdominal bloating and cramps.  She is currently taking Bentyl 20 mg as needed which helps to prevent further episodes after she had an episode of diarrhea.  She continues to take amitriptyline 20 mg at bedtime.  Flexible sigmoidoscopy with repeat biopsies was unremarkable except for hemorrhoids.  She has lost few pounds due to restricted diet and not knowing what foods are triggering her diarrheal episodes.  She is planning on a cruise to [place] in September for her 40th anniversary.  She will be traveling to [place] to visit her mom who is fighting with cancer.  That's the only stress in her life at this time.  NSAIDs: None  Antiplts/Anticoagulants/Anti thrombotics: None  GI Procedures: Had a colonoscopy at age 37, reportedly normal  Sigmoidoscopy 05/21/2020 - Normal mucosa in the entire examined colon. - A single (solitary) ulcer in the distal rectum. - Non-bleeding external hemorrhoids. DIAGNOSIS:  A. COLON, RANDOM; COLD BIOPSY:  - BENIGN COLONIC MUCOSA WITH NO SIGNIFICANT HISTOPATHOLOGIC CHANGE.  - NEGATIVE FOR FEATURES OF MICROSCOPIC COLITIS.   - NEGATIVE FOR DYSPLASIA AND MALIGNANCY.   EGD and colonoscopy 01/31/2019 - Normal duodenal bulb and second portion of the duodenum. Biopsied. - Normal stomach. Biopsied. - Esophagogastric landmarks identified. - Normal gastroesophageal junction and esophagus.  - Preparation of the colon was fair. - Hemorrhoids found on perianal exam. - The examined portion of the ileum was normal. - The entire examined colon is normal. Biopsied. - The distal rectum and anal verge are normal on retroflexion view.  DIAGNOSIS:  A. DUODENUM; COLD BIOPSY:  - DUODENAL MUCOSA WITH NO SIGNIFICANT PATHOLOGIC ALTERATION.  - NEGATIVE FOR FEATURES OF CELIAC DISEASE.  - NEGATIVE FOR DYSPLASIA AND MALIGNANCY.   B. STOMACH; COLD BIOPSY:  - GASTRIC ANTRAL AND OXYNTIC MUCOSA WITH NO SIGNIFICANT PATHOLOGIC  ALTERATION.  - NEGATIVE FOR ACTIVE INFLAMMATION AND H PYLORI.  - NEGATIVE FOR INTESTINAL METAPLASIA, DYSPLASIA, AND MALIGNANCY.   C. COLON, RANDOM; COLD BIOPSY:  - COLONIC MUCOSA WITH NO SIGNIFICANT PATHOLOGIC ALTERATION.  - NEGATIVE FOR MICROSCOPIC COLITIS, DYSPLASIA, AND MALIGNANCY.   D. COLON POLYP, SIGMOID; COLD SNARE:  - TUBULAR ADENOMA.  - NEGATIVE FOR HIGH-GRADE DYSPLASIA AND MALIGNANCY.  She denies family history of inflammatory bowel disease, celiac disease, GI malignancy  Past Medical History:  Diagnosis Date  . Allergy   . Arthritis   . Basal cell carcinoma    nose, left cheek, right upper lip, right posterior shoulder most recent 6 years ago on left cheek  . Bleeding   . Chicken pox   . Complication of anesthesia    blood pressure elevated after gastric sleeve surgery in recovery, pt does not want mask over face  . COVID-19    03/01/20 had MAB 03/03/20 and felt better next day 03/04/20  . Depression   . Diabetes mellitus without complication ([place])    pre-diabetic currently, was diabetic prior to gastric sleeve surgery   . Disorder of right rotator cuff    10/03/19 MRI right shoulder  abnormal Dr. Maurine Cane  . Fatty liver   . Gout   . Hematuria 10/15/2018  . History of shingles   . Hyperlipidemia   . Hypertension    no problems since losing weight after gastric sleeve surgery  . IBS (irritable bowel syndrome)   . Insomnia   . Migraine   . Obesity   . Osteopenia     Past Surgical History:  Procedure Laterality Date  . ABDOMINAL HYSTERECTOMY     no h/o abnormal pap still has 1 ovary left  . ANKLE ARTHROSCOPY Left 05/29/2016   Procedure: ANKLE ARTHROSCOPY;  Surgeon: [name], DPM;  Location: [facility];  Service: Podiatry;  Laterality: Left;  . APPENDECTOMY    . basal cell removed    . [place]  . CHOLECYSTECTOMY    . COLONOSCOPY WITH PROPOFOL N/A 01/31/2019   Procedure: COLONOSCOPY WITH PROPOFOL;  Surgeon: Lin Landsman, MD;  Location: [facility] ENDOSCOPY;  Service: Gastroenterology;  Laterality: N/A;  . cyst removed from wrist    . ESOPHAGOGASTRODUODENOSCOPY (EGD) WITH PROPOFOL N/A 01/31/2019   Procedure: ESOPHAGOGASTRODUODENOSCOPY (EGD) WITH PROPOFOL;  Surgeon: Lin Landsman, MD;  Location: [facility];  Service: Gastroenterology;  Laterality: N/A;  .  FLEXIBLE SIGMOIDOSCOPY N/A 05/21/2020   Procedure: FLEXIBLE SIGMOIDOSCOPY;  Surgeon: Lin Landsman, MD;  Location: [facility]COPY;  Service: Gastroenterology;  Laterality: N/A;  . FOOT SURGERY     left foot 11/2016 Dr. Laymond Purser   . left elbow surgery     07/2017 Dr Leanor Kail epicondylitis   . MOHS SURGERY     x 2 face nose and left cheek   . OSTEOCHONDROMA EXCISION Left 05/29/2016   Procedure: OSTEOCHONDRAL DRILLING TALUS;  Surgeon: [name], DPM;  Location: [place] [place];  Service: Podiatry;  Laterality: Left;  . PLANTAR FASCIA RELEASE Left 05/29/2016   Procedure: ENDOSCOPIC PLANTAR FASCIOTOMY;  Surgeon: [name], DPM;  Location: [place];  Service: Podiatry;  Laterality: Left;  . ROTATOR CUFF REPAIR     ? right likely shoulder  . STOMACH SURGERY  2017    gastric sleeve surgery  . TENNIS ELBOW RELEASE/NIRSCHEL PROCEDURE Left 08/05/2017   Procedure: TENNIS ELBOW RELEASE/NIRSCHEL PROCEDURE;  Surgeon: Leanor Kail, MD;  Location: [facility]S;  Service: Orthopedics;  Laterality: Left;  . TUBAL LIGATION    . wedge resection of ovary  1982    Current Outpatient Medications:  .  albuterol (VENTOLIN HFA) 108 (90 Base) MCG/ACT inhaler, Inhale 1-2 puffs into the lungs every 6 (six) hours as needed for wheezing or shortness of breath., Disp: 18 g, Rfl: 11 .  amitriptyline (ELAVIL) 25 MG tablet, TAKE 1 TABLET (25 MG TOTAL) BY MOUTH AT BEDTIME. INCREASE TO 50MG  AFTER 2 WEEKS IF NEEDED, Disp: 180 tablet, Rfl: 0 .  amLODipine (NORVASC) 5 MG tablet, Take 1 tablet (5 mg total) by mouth daily. And stop 2.5 mg daily (2 pills), Disp: 90 tablet, Rfl: 3 .  Cholecalciferol (DIALYVITE VITAMIN D 5000 PO), Take 5,000 Units by mouth every other day., Disp: , Rfl:  .  Continuous Blood Gluc Sensor (FREESTYLE LIBRE 2 SENSOR) MISC, Use to check glucose twice daily, Disp: 2 each, Rfl: 11 .  dicyclomine (BENTYL) 20 MG tablet, Take 20 mg by mouth 2 (two) times daily as needed for spasms., Disp: , Rfl:  .  empagliflozin (JARDIANCE) 10 MG TABS tablet, Take 1 tablet (10 mg total) by mouth daily before breakfast., Disp: 30 tablet, Rfl: 2 .  fexofenadine (ALLEGRA) 180 MG tablet, Take 1 tablet (180 mg total) by mouth daily as needed for allergies or rhinitis., Disp: 90 tablet, Rfl: 3 .  hydrocortisone (ANUSOL-HC) 2.5 % rectal cream, Place 1 application rectally 2 (two) times daily., Disp: 30 g, Rfl: 0 .  hydrOXYzine (ATARAX/VISTARIL) 25 MG tablet, TAKE 1 TABLET BY MOUTH 2 TIMES DAILY AS NEEDED., Disp: 180 tablet, Rfl: 1 .  metroNIDAZOLE (METROCREAM) 0.75 % cream, Apply to cheeks and nose every night for rosacea., Disp: 45 g, Rfl: 5 .  montelukast (SINGULAIR) 10 MG tablet, Take 1 tablet (10 mg total) by mouth at bedtime., Disp: 90 tablet, Rfl: 3 .  Multiple Vitamin (MULTIVITAMIN) tablet,  Take 1 tablet by mouth daily., Disp: , Rfl:  .  omeprazole (PRILOSEC) 40 MG capsule, TAKE 1 CAPSULE (40 MG TOTAL) BY MOUTH DAILY BEFORE SUPPER., Disp: 90 capsule, Rfl: 0 .  ondansetron (ZOFRAN-ODT) 4 MG disintegrating tablet, Take 1 tablet (4 mg total) by mouth every 8 (eight) hours as needed. for nausea, Disp: 30 tablet, Rfl: 5 .  potassium chloride (MICRO-K) 10 MEQ CR capsule, Take 2 capsules (20 mEq total) by mouth daily., Disp: 180 capsule, Rfl: 3 .  Rimegepant Sulfate (NURTEC) 75 MG TBDP, Take 75  mg by mouth daily as needed (take for abortive therapy of migraine, no more than 1 tablet in 24 hours or 10 per month)., Disp: 10 tablet, Rfl: 11 .  rosuvastatin (CRESTOR) 20 MG tablet, Take 1 tablet (20 mg total) by mouth every Monday, Wednesday, and Friday., Disp: 45 tablet, Rfl: 3 .  tiZANidine (ZANAFLEX) 2 MG tablet, Take 2 mg by mouth daily as needed for muscle spasms., Disp: , Rfl:  .  topiramate (TOPAMAX) 50 MG tablet, Take 1 tablet (50 mg total) by mouth 2 (two) times daily., Disp: 90 tablet, Rfl: 3 .  triamcinolone (KENALOG) 0.1 %, Apply 1 application topically 2 (two) times daily. Prn itching hives on body not face, Disp: 454 g, Rfl: 0 .  triamterene-hydrochlorothiazide (MAXZIDE-25) 37.5-25 MG tablet, Take 0.5 tablets by mouth daily. In the am, Disp: 90 tablet, Rfl: 3 .  zolpidem (AMBIEN CR) 12.5 MG CR tablet, Take 1 tablet (12.5 mg total) by mouth at bedtime as needed., Disp: 30 tablet, Rfl: 5   Family History  Problem Relation Age of Onset  . Heart disease Mother   . Breast cancer Mother 48       metastatic, now hip, spine, and leg   . Arthritis Mother   . Hyperlipidemia Mother   . Cancer Mother        spinal surgery. breast dx'ed age 52 now metastatic 13 as of 07/13/18  . Cystic fibrosis Mother        carrier   . Heart disease Father   . Lung cancer Father        smoker  . Hyperlipidemia Father   . Hypertension Father   . Diabetes Sister   . Stroke Sister   . Other Sister         covid +  . Diabetes Maternal Aunt   . Diabetes Maternal Uncle   . Diabetes Maternal Grandfather   . Hyperlipidemia Maternal Grandfather   . Arthritis Maternal Grandmother   . Sudden death Cousin   . Diabetes Maternal Aunt   . Diabetes Sister   . Cystic fibrosis Other        great niece   . Colon cancer Neg Hx   . Ovarian cancer Neg Hx      Social History   Tobacco Use  . Smoking status: Never Smoker  . Smokeless tobacco: Never Used  Vaping Use  . Vaping Use: Never used  Substance Use Topics  . Alcohol use: Never  . Drug use: Never    Allergies as of 06/05/2020 - Review Complete 06/05/2020  Allergen Reaction Noted  . Tetracyclines & related Swelling and Rash 06/04/2014  . Amoxicillin  12/10/2018  . Alcohol Rash 12/07/2014  . Azithromycin Rash 07/19/2018  . Metoprolol Diarrhea 12/07/2014    Review of Systems:    All systems reviewed and negative except where noted in HPI.   Physical Exam:  BP 117/83 (BP Location: Left Arm, Patient Position: Sitting, Cuff Size: Normal)   Pulse 86   Temp 97.7 F (36.5 C) (Oral)   Ht 5\' 3"  (1.6 m)   Wt 175 lb 6 oz (79.5 kg)   BMI 31.07 kg/m  No LMP recorded. Patient has had a hysterectomy.  General:   Alert,  Well-developed, well-nourished, pleasant and cooperative in NAD Head:  Normocephalic and atraumatic. Eyes:  Sclera clear, no icterus.   Conjunctiva pink. Ears:  Normal auditory acuity. Nose:  No deformity, discharge, or lesions. Mouth:  No deformity or lesions,oropharynx pink & moist.  Neck:  Supple; no masses or thyromegaly. Lungs:  Respirations even and unlabored.  Clear throughout to auscultation.   No wheezes, crackles, or rhonchi. No acute distress. Heart:  Regular rate and rhythm; no murmurs, clicks, rubs, or gallops. Abdomen:  Normal bowel sounds. Soft, non-tender and non-distended without masses, hepatosplenomegaly or hernias noted.  No guarding or rebound tenderness.   Rectal: Not performed Msk:  Symmetrical  without gross deformities. Good, equal movement & strength bilaterally. Pulses:  Normal pulses noted. Extremities:  No clubbing or edema.  No cyanosis. Neurologic:  Alert and oriented x3;  grossly normal neurologically. Skin:  Intact without significant lesions or rashes. No jaundice. Psych:  Alert and cooperative. Normal mood and affect.  Imaging Studies: Reviewed  Assessment and Plan:   Kristin [name] is a 60 y.o. white female with BMI 34, metabolic syndrome seen in consultation for rectal bleeding, chronic diarrhea and chronic GERD  Chronic diarrhea alternating with mild episodes of constipation Stool for C. difficile negative in the past EGD and colonoscopy with biopsies were unremarkable, no evidence of celiac disease, microscopic colitis, IBD Recommend to check pancreatic fecal elastase levels Perform GI pathogen panel Continue amitriptyline 25 mg at bedtime Recommend to take scheduled dose of Bentyl 20 mg once a day before her major meal of the day  Rectal bleeding: Secondary to external hemorrhoids, currently resolved S/p ligation of RP hemorrhoid Defer hemorrhoid ligation today  Chronic GERD EGD with no evidence of Barrett's esophagus or erosive esophagitis Continue omeprazole 40 mg once daily Continue antireflux lifestyle  Fatty liver, elevated LFTs, s/p cholecystectomy Monitor LFTs every 6 months Discussed about control of diabetes, weight loss and exercise Recommend viral hepatitis panel and other secondary liver disease work-up during next visit   Follow up in 3-4 months   Cephas Darby, MD

## 2020-06-06 DIAGNOSIS — K529 Noninfective gastroenteritis and colitis, unspecified: Secondary | ICD-10-CM | POA: Diagnosis not present

## 2020-06-08 ENCOUNTER — Ambulatory Visit: Payer: BC Managed Care – PPO | Admitting: Gastroenterology

## 2020-06-11 ENCOUNTER — Encounter: Payer: Self-pay | Admitting: Internal Medicine

## 2020-06-11 ENCOUNTER — Encounter: Payer: Self-pay | Admitting: Gastroenterology

## 2020-06-11 LAB — GI PROFILE, STOOL, PCR

## 2020-06-11 LAB — PANCREATIC ELASTASE, FECAL: Pancreatic Elastase, Fecal: 237 ug Elast./g (ref >200)

## 2020-06-27 ENCOUNTER — Other Ambulatory Visit: Payer: Self-pay | Admitting: Gastroenterology

## 2020-06-28 ENCOUNTER — Telehealth: Payer: Self-pay | Admitting: Internal Medicine

## 2020-06-28 NOTE — Telephone Encounter (Signed)
What does she want referral for  [facility]tho hand could manage carpal tunnel at [facility]? [facility] in [place] or [place] or [place]? Neurology she is also established with [facility] for neuropathy   Would she like referral to both?

## 2020-06-28 NOTE — Telephone Encounter (Signed)
PT called into the office to state that [facility] keeps pushing her off and off. She would like for the Neuropathy referral to be sent to [facility] instead at  [phone]. She also states that [facility] would like to know if Dr.TMS would like the results or if they would need to followup at [facility].

## 2020-06-28 NOTE — Telephone Encounter (Signed)
Last office visit 06/05/2020 chronic diarrhea  Last refill 04/02/2020 90 0 refills

## 2020-06-29 NOTE — Telephone Encounter (Signed)
Patient would like to be referred to both.

## 2020-07-09 NOTE — Addendum Note (Signed)
Addended by: Orland Mustard on: 07/09/2020 11:32 AM   Modules accepted: Orders

## 2020-07-09 NOTE — Telephone Encounter (Signed)
Let me know if not called about urgent referrals  1.hand referred [facility] in [place] I want Dr. Amedeo Plenty he is the best or his partner  2. Neurology referred Dr. Jaynee Eagles she is established and can call as well for appt

## 2020-07-09 NOTE — Telephone Encounter (Signed)
Patient informed and verbalized understanding

## 2020-07-10 ENCOUNTER — Telehealth: Payer: Self-pay | Admitting: Internal Medicine

## 2020-07-10 NOTE — Telephone Encounter (Signed)
Rejection Reason - Patient Declined - Patient states she's not needing to be seen for her hands but her feet. She's wanting to be seen at the [facility] location. Thanks!" Kristin [name] said on Jul 10, 2020 2:12 PM  Msg sent from [facility]

## 2020-07-16 ENCOUNTER — Telehealth: Payer: Self-pay | Admitting: Internal Medicine

## 2020-07-16 ENCOUNTER — Ambulatory Visit: Payer: BC Managed Care – PPO | Admitting: Pharmacist

## 2020-07-16 DIAGNOSIS — M7501 Adhesive capsulitis of right shoulder: Secondary | ICD-10-CM | POA: Diagnosis not present

## 2020-07-16 DIAGNOSIS — E1165 Type 2 diabetes mellitus with hyperglycemia: Secondary | ICD-10-CM

## 2020-07-16 DIAGNOSIS — I10 Essential (primary) hypertension: Secondary | ICD-10-CM

## 2020-07-16 DIAGNOSIS — E785 Hyperlipidemia, unspecified: Secondary | ICD-10-CM

## 2020-07-16 NOTE — Telephone Encounter (Signed)
Contacted patient. See CCM documentation. No symptoms accompanying high glucose. No nausea, vomiting, diarrhea, stomach upset, dizziness, or headaches.   Glucose has been well controlled prior to elevations as a result of cortisone injection today.   Advised hydration and notification of our office if symptoms develop. Advised that metformin XR is not a fasting acting medication and taking metformin PRN is not an effective treatment. Advised that elevations may persist for the next several days.   Patient will follow up with PCP on Wednesday as scheduled.

## 2020-07-16 NOTE — Patient Instructions (Signed)
Visit Information   Goals Addressed               This Visit's Progress     Patient Stated     Medication Monitoring (pt-stated)        Patient Goals/Self-Care Activities Over the next 90 days, patient will:  - take medications as prescribed check glucose 2-3 times daily using CGM, document, and provide at future appointments engage in dietary modifications by reducing carbohydrate portion sizes          Patient verbalizes understanding of instructions provided today and agrees to view in [place].   Plan: Telephone follow up appointment with care management team member scheduled for:  ~ 4 weeks  Catie Darnelle Maffucci, PharmD, [place], [place] Clinical Pharmacist [employer] [facility] [employer] [phone]

## 2020-07-16 NOTE — Chronic Care Management (AMB) (Signed)
Chronic Care Management Pharmacy Note  07/16/2020 Name:  Kristin [name] MRN:  [id number] DOB:  [birth year]   Subjective: Kristin [name] is an 60 y.o. year old female who is a primary patient of McLean-Scocuzza, Nino Glow, MD.  The CCM team was consulted for assistance with disease management and care coordination needs.    Engaged with patient by telephone for  medication management question  in response to provider referral for pharmacy case management and/or care coordination services.   Consent to Services:  The patient was given information about Chronic Care Management services, agreed to services, and gave verbal consent prior to initiation of services.  Please see initial visit note for detailed documentation.   Patient Care Team: McLean-Scocuzza, Nino Glow, MD as PCP - General (Internal Medicine) De Hollingshead, RPH-CPP (Pharmacist)  Objective:  Lab Results  Component Value Date   CREATININE 0.97 05/03/2020   CREATININE 1.04 04/23/2020   CREATININE 1.09 04/05/2020    Lab Results  Component Value Date   HGBA1C 8.3 (H) 04/05/2020   Last diabetic Eye exam:  Lab Results  Component Value Date/Time   HMDIABEYEEXA No Retinopathy 06/04/2020 12:00 AM    Last diabetic Foot exam: No results found for: HMDIABFOOTEX      Component Value Date/Time   CHOL 142 04/05/2020 1205   CHOL 147 05/05/2019 0838   TRIG 173.0 (H) 04/05/2020 1205   HDL 48.80 04/05/2020 1205   HDL 58 05/05/2019 0838   CHOLHDL 3 04/05/2020 1205   VLDL 34.6 04/05/2020 1205   LDLCALC 59 04/05/2020 1205   LDLCALC 62 05/05/2019 0838    Hepatic Function Latest Ref Rng & Units 04/23/2020 04/05/2020 08/19/2019  Total Protein 6.0 - 8.3 g/dL 7.3 7.8 7.0  Albumin 3.5 - 5.2 g/dL 4.5 4.3 3.9  AST 0 - 37 U/L 42(H) 38(H) 23  ALT 0 - 35 U/L 51(H) 42(H) 20  Alk Phosphatase 39 - 117 U/L 91 96 71  Total Bilirubin 0.2 - 1.2 mg/dL 0.3 0.4 0.3    Lab Results  Component Value Date/Time   TSH 2.49  04/05/2020 12:05 PM   TSH 3.94 10/05/2019 08:53 AM   FREET4 0.63 10/05/2019 08:53 AM    CBC Latest Ref Rng & Units 05/03/2020 04/23/2020 04/23/2020  WBC 4.0 - 10.5 K/uL 8.9 9.7 9.6  Hemoglobin 12.0 - 15.0 g/dL 13.6 13.0 13.3  Hematocrit 36.0 - 46.0 % 40.7 39.0 40.3  Platelets 150.0 - 400.0 K/uL 323.0 295.0 302    Lab Results  Component Value Date/Time   VD25OH 31.66 04/05/2020 12:05 PM   VD25OH 23.9 (L) 05/05/2019 08:38 AM   VD25OH 19.93 (L) 08/31/2018 08:37 AM    Clinical ASCVD: No  The 10-year ASCVD risk score Mikey Bussing DC Jr., et al., 2013) is: 5.9%   Values used to calculate the score:     Age: 34 years     Sex: Female     Is Non-Hispanic African American: No     Diabetic: Yes     Tobacco smoker: No     Systolic Blood Pressure: 295 mmHg     Is BP treated: Yes     HDL Cholesterol: 48.8 mg/dL     Total Cholesterol: 142 mg/dL    Social History   Tobacco Use  Smoking Status Never  Smokeless Tobacco Never   BP Readings from Last 3 Encounters:  06/05/20 117/83  05/21/20 (!) 143/74  05/08/20 137/84   Pulse Readings from Last 3 Encounters:  06/05/20 86  05/21/20 92  05/08/20 85   Wt Readings from Last 3 Encounters:  06/05/20 175 lb 6 oz (79.5 kg)  05/21/20 176 lb (79.8 kg)  05/08/20 178 lb 8 oz (81 kg)    Assessment: Review of patient past medical history, allergies, medications, health status, including review of consultants reports, laboratory and other test data, was performed as part of comprehensive evaluation and provision of chronic care management services.   SDOH:  (Social Determinants of Health) assessments and interventions performed:  SDOH Interventions    Flowsheet Row Most Recent Value  SDOH Interventions   Financial Strain Interventions Intervention Not Indicated       CCM Care Plan  Allergies  Allergen Reactions   Tetracyclines & Related Swelling and Rash    FACE SWELL, RASH ON CHEST   Amoxicillin    Alcohol Rash    ABDOMINAL CRAMPS,  drinking alcohol   Azithromycin Rash   Metoprolol Diarrhea    Medications Reviewed Today     Reviewed by Shelby Mattocks, CMA (Certified Medical Assistant) on 06/05/20 at 1328  Med List Status: <None>   Medication Order Taking? Sig Documenting Provider Last Dose Status Informant  albuterol (VENTOLIN HFA) 108 (90 Base) MCG/ACT inhaler [id number] Yes Inhale 1-2 puffs into the lungs every 6 (six) hours as needed for wheezing or shortness of breath. McLean-Scocuzza, Nino Glow, MD Taking Active   amitriptyline (ELAVIL) 25 MG tablet [id number] Yes TAKE 1 TABLET (25 MG TOTAL) BY MOUTH AT BEDTIME. INCREASE TO 50MG AFTER 2 WEEKS IF NEEDED Vanga, Tally Due, MD Taking Active   amLODipine (NORVASC) 5 MG tablet [id number] Yes Take 1 tablet (5 mg total) by mouth daily. And stop 2.5 mg daily (2 pills) McLean-Scocuzza, Nino Glow, MD Taking Active   Cholecalciferol (DIALYVITE VITAMIN D 5000 PO) [id number] Yes Take 5,000 Units by mouth every other day. [provider] Taking Active   Continuous Blood Gluc Sensor (FREESTYLE LIBRE 2 SENSOR) [place] [id number] Yes Use to check glucose twice daily McLean-Scocuzza, Nino Glow, MD Taking Active   dicyclomine (BENTYL) 20 MG tablet [id number] Yes Take 20 mg by mouth 2 (two) times daily as needed for spasms. [provider] Taking Active   empagliflozin (JARDIANCE) 10 MG TABS tablet [id number] Yes Take 1 tablet (10 mg total) by mouth daily before breakfast. McLean-Scocuzza, Nino Glow, MD Taking Active   fexofenadine (ALLEGRA) 180 MG tablet [id number] Yes Take 1 tablet (180 mg total) by mouth daily as needed for allergies or rhinitis. McLean-Scocuzza, Nino Glow, MD Taking Active   hydrocortisone (ANUSOL-HC) 2.5 % rectal cream [id number] Yes Place 1 application rectally 2 (two) times daily. Lin Landsman, MD Taking Active   hydrOXYzine (ATARAX/VISTARIL) 25 MG tablet [id number] Yes TAKE 1 TABLET BY MOUTH 2 TIMES DAILY AS NEEDED. McLean-Scocuzza, Nino Glow, MD Taking Active    metFORMIN (GLUCOPHAGE) 500 MG tablet [id number] Yes Take 1 tablet by mouth 2 (two) times daily. [provider] Taking Active   metroNIDAZOLE (METROCREAM) 0.75 % cream [id number] Yes Apply to cheeks and nose every night for rosacea. Brendolyn Patty, MD Taking Active   montelukast (SINGULAIR) 10 MG tablet [id number] Yes Take 1 tablet (10 mg total) by mouth at bedtime. McLean-Scocuzza, Nino Glow, MD Taking Active   Multiple Vitamin (MULTIVITAMIN) tablet [id number] Yes Take 1 tablet by mouth daily. [provider] Taking Active   omeprazole (PRILOSEC) 40 MG capsule [id number] Yes TAKE 1 CAPSULE (40 MG TOTAL) BY MOUTH DAILY BEFORE SUPPER. Lin Landsman, MD Taking Active  ondansetron (ZOFRAN-ODT) 4 MG disintegrating tablet [id number] Yes Take 1 tablet (4 mg total) by mouth every 8 (eight) hours as needed. for nausea McLean-Scocuzza, Nino Glow, MD Taking Active   potassium chloride (MICRO-K) 10 MEQ CR capsule [id number] Yes Take 2 capsules (20 mEq total) by mouth daily. McLean-Scocuzza, Nino Glow, MD Taking Active   Rimegepant Sulfate (NURTEC) 75 MG TBDP [id number] Yes Take 75 mg by mouth daily as needed (take for abortive therapy of migraine, no more than 1 tablet in 24 hours or 10 per month). Lomax, Amy, NP Taking Active   rosuvastatin (CRESTOR) 20 MG tablet [id number] Yes Take 1 tablet (20 mg total) by mouth every Monday, Wednesday, and Friday. McLean-Scocuzza, Nino Glow, MD Taking Active   tiZANidine (ZANAFLEX) 2 MG tablet [id number] Yes Take 2 mg by mouth daily as needed for muscle spasms. [provider] Taking Active   topiramate (TOPAMAX) 50 MG tablet [id number] Yes Take 1 tablet (50 mg total) by mouth 2 (two) times daily. Debbora Presto, NP Taking Active            Med Note AASHIKA, CARTA May 28, 2020  1:06 PM) Taking once daily QAM  triamcinolone (KENALOG) 0.1 % [id number] Yes Apply 1 application topically 2 (two) times daily. Prn itching hives on body not face McLean-Scocuzza,  Nino Glow, MD Taking Active   triamterene-hydrochlorothiazide (MAXZIDE-25) 37.5-25 MG tablet [id number] Yes Take 0.5 tablets by mouth daily. In the am McLean-Scocuzza, Nino Glow, MD Taking Active   zolpidem (AMBIEN CR) 12.5 MG CR tablet [id number] Yes Take 1 tablet (12.5 mg total) by mouth at bedtime as needed. McLean-Scocuzza, Nino Glow, MD Taking Active             Patient Active Problem List   Diagnosis Date Noted   Rectal bleeding 05/03/2020   CTS (carpal tunnel syndrome) 04/05/2020   Stress 03/28/2020   Hives 03/28/2020   Annual physical exam 08/19/2019   Hypertension associated with diabetes ([place]) 08/18/2019   Obesity, diabetes, and hypertension syndrome ([place]) 08/18/2019   Irritable bowel syndrome 03/01/2019   Chronic diarrhea    DM2 (diabetes mellitus, type 2) ([place]) 08/31/2018   Vitamin D deficiency 08/31/2018   Chronic migraine without aura, with intractable migraine, so stated, with status migrainosus 11/10/2017   Chronic intractable headache 10/20/2017   Obesity (BMI 30-39.9) 05/06/2017   Fatty liver 05/06/2017   Injury of right wrist 06/26/2016   Hyperlipidemia 12/12/2015   Anxiety 12/12/2015   Obstructive sleep apnea syndrome 10/10/2014   Gastroesophageal reflux disease with hiatal hernia 09/29/2014   Morbid obesity ([place]) 09/29/2014   Benign essential hypertension 08/14/2014   Palpitations 08/14/2014   Hypertension 07/17/2014   Insomnia 07/17/2014    Immunization History  Administered Date(s) Administered   Hepatitis A, Adult 05/06/2017, 04/05/2020   Influenza Inj Mdck Quad Pf 10/09/2018   Influenza,inj,Quad PF,6+ Mos 10/26/2016, 10/06/2017, 10/21/2018   Influenza-Unspecified 11/03/2015, 10/26/2016, 10/21/2019   Moderna Sars-Covid-2 Vaccination 04/06/2019, 05/04/2019, 11/12/2019   Tdap 12/10/2012, 01/08/2013    Conditions to be addressed/monitored: HLD and DMII  Care Plan : PharmD - Medication Management  Updates made by De Hollingshead, RPH-CPP since  07/16/2020 12:00 AM     Problem: Diabetes, IBS-D, HLD, Anxiety      Long-Range Goal: Disease Progression Prevention   Start Date: 05/28/2020  This Visit's Progress: On track  Recent Progress: On track  Priority: High  Note:   Current Barriers:  Unable to achieve control of diabetes   Pharmacist  Clinical Goal(s):  Over the next 90 days, patient will achieve control of diabetes as evidenced by A1c through collaboration with PharmD and provider.   Interventions: 1:1 collaboration with McLean-Scocuzza, Nino Glow, MD regarding development and update of comprehensive plan of care as evidenced by provider attestation and co-signature Inter-disciplinary care team collaboration (see longitudinal plan of care) Comprehensive medication review performed; medication list updated in electronic medical record  SDOH: Mother of 3 sons. Splits time between here and [place] caring for her ailing mother. Sister in [place] is a Marine scientist.  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   Diabetes: Uncontrolled but improved; current treatment: Jardiance 10 mg  Did call to report a yeast infection after initially starting Jardiance. Treated, has not recurred.  Calls today to report concerns about elevated glucose to 340. Reports she took the old script for metformin XR in hopes that it would quickly lower her sugars, which it didn't. Current glucose readings: Using Libre 2 CGM Date of Download: 6/14-6/27 % Time CGM is active: 76% Average Glucose: 137 mg/dL Glucose Management Indicator: 6.6%  Glucose Variability: 24.2 (goal <36%) Time in Goal:  - Time in range 70-180: 87% - Time above range: 13% - Time below range: 0% Observed patterns: infrequent post prandial elevations Educated that elevations in glucose after steroid injections are normal. Patient denies any associated symptoms, including nausea, vomiting, diarrhea, any GI upset, headaches, or dizziness. Advised continued hydration. Advised to contact our office or  seek care if any of the above symptoms result with continued glucose elevation.  Educated that metformin is not a quick acting medication and is not expected to have an acute effect on lowering blood sugars. Continue Jardiance monotherapy with hydration.  Monitor for recurrence of yeast infection, as glucosuria may be more pronounced with glycemic elevations. If this occurs, recommend to hold Jardiance until yeast infection has been treated and glucose readings return to normal Follow up with PCP on Wednesday as scheduled.  Educated on how to silence high and low glucose alarms on Libre  Hypertension w/ hx hypokalemia: Controlled; current treatment: triamterene/HCTZ 37.5/25 mg 1/2 tab daily, amlodipine 5 mg daily QAM; potassium 20 mEq daily . Reports leg cramps are significantly improved with potassium supplementation Current home readings: did not discuss today Will discuss home BP readings at future visits. Recommend to continue current regimen at this time  Hyperlipidemia: Uncontrolled; current treatment: rosuvastatin 20 mg every other day Previously noted that she had improvement in leg cramps since reducing rosuvastatin, but also repleted potassium during that time. Recommend increasing rosuvastatin to daily administration moving forward, as leg cramps were more likely related to hypokalemia.   Asthma/Allergies: Exacerbated by pollen; current treatment: montelukast 10 mg daily (could not find bottle today, plans to find); fexofenadine 180 mg daily, hydroxyzine 25 mg PRN hives, triamcinolone 0.1% cream PRN hives, related to changing environments, stress from caring for her mother; no need for albuterol HFA since she recovered from [place].  Previously recommended to continue current regimen at this time. Restart montelukast  Insomnia/Anxiety/Neuropathy Controlled, but with high risk medications as patient ages; current regimen: zolpidem 12.5 mg QPM, amitriptyline 25 mg QPM. Script for  tizanidine 2 mg PRN muscle spasms, reports she takes for severe leg cramps, but this has improved since potassium supplementation was added  Hx fluoxetine; hx diazepam, temazepam, trazodone, nortriptyline.  Referral to neurology, Dr. Manuella Ghazi, in place.  Follow for safety of amitriptyline and zolpidem therapy, especially in combination with topiramate. Patient already noting that she will forget  things that she doesn't write down, forget to take her medications, etc.   Migraines: Improved control; current regimen: topiramate 50 mg QAM; concurrent amitriptyline, Nurtec 75 mg PRN; onransetron 4 mg PRN nausea Hx Emgality - did not like giving injections Reduced topiramate to once daily due to mental fog.  Previously recommended to continue current regimen at this time along with neurology collaboration  IBS-D/Hemorrhoids: Improved; current regimen: dicyclomine 20 mg BID PRN-; hydrocortisone 2.5% rectal - though has not needed since last ligation; omeprazole 40 mg daily, amitriptyline for anticholinergic benefit; follows w/ Dr. Marius Ditch;  Previously recommended to continue current regimen as above  Patient Goals/Self-Care Activities Over the next 90 days, patient will:  - take medications as prescribed check glucose 2-3 times daily using CGM, document, and provide at future appointments engage in dietary modifications by reducing carbohydrate portion sizes  Follow Up Plan: Telephone follow up appointment with care management team member scheduled for: ~4 weeks     Medication Assistance: None required.  Patient affirms current coverage meets needs.  Patient's preferred pharmacy is:  [id number] [address]: [phone]: [phone] [facility]  [address] [address] [address]Phone: [phone]:  [phone] [id number] [address]: [phone]: [phone] Follow Up:  Patient agrees to Care Plan and Follow-up.  Plan: Telephone follow up appointment with care management team member scheduled for:  ~ 4 weeks  Catie TrDarnelle MaffucciPharmD, BCDacomaCPLawrencevillelinical Pharmacist LeOcc[employer] Pet[employer]t Bu[phone]

## 2020-07-16 NOTE — Telephone Encounter (Signed)
Access Nurse called and stated that pt got a cortisone injection this morning and since then her blood sugar readings have been greater than 300 and pt states that they are continuing to climb. Pt states that she is not having any symptoms. Pt takes Jardiance every morning. She also stated that she used to take Metformin and when she saw how high her sugar was she went ahead and took a metformin but stated that it did not help and sugars still continue to climb.

## 2020-07-16 NOTE — Telephone Encounter (Signed)
FYI Dr Aundra Dubin  Thank you catie and agree with your recommendations and plan.

## 2020-07-17 NOTE — Telephone Encounter (Signed)
Patient seen by Pharm D for CCM on 07/16/20

## 2020-07-17 NOTE — Telephone Encounter (Signed)
If for her feet and numbness/tingling/migraines this will need to be neurology Dr. Loman [place] neurology referral placed  If for carpal tunnel this will be emerge  ortho to manage  She requested both referrals via phone call encounter earlier in 06/2020  Inform pt and sch accordingly   Thank yo u

## 2020-07-18 ENCOUNTER — Other Ambulatory Visit: Payer: Self-pay

## 2020-07-18 ENCOUNTER — Encounter: Payer: Self-pay | Admitting: Internal Medicine

## 2020-07-18 ENCOUNTER — Telehealth: Payer: Self-pay | Admitting: Neurology

## 2020-07-18 ENCOUNTER — Ambulatory Visit (INDEPENDENT_AMBULATORY_CARE_PROVIDER_SITE_OTHER): Payer: BC Managed Care – PPO | Admitting: Internal Medicine

## 2020-07-18 VITALS — BP 110/84 | HR 65 | Temp 97.8°F | Ht 63.0 in | Wt 169.0 lb

## 2020-07-18 DIAGNOSIS — Z0184 Encounter for antibody response examination: Secondary | ICD-10-CM | POA: Diagnosis not present

## 2020-07-18 DIAGNOSIS — G629 Polyneuropathy, unspecified: Secondary | ICD-10-CM

## 2020-07-18 DIAGNOSIS — K760 Fatty (change of) liver, not elsewhere classified: Secondary | ICD-10-CM | POA: Diagnosis not present

## 2020-07-18 DIAGNOSIS — R739 Hyperglycemia, unspecified: Secondary | ICD-10-CM | POA: Diagnosis not present

## 2020-07-18 DIAGNOSIS — E1159 Type 2 diabetes mellitus with other circulatory complications: Secondary | ICD-10-CM | POA: Diagnosis not present

## 2020-07-18 DIAGNOSIS — I152 Hypertension secondary to endocrine disorders: Secondary | ICD-10-CM | POA: Diagnosis not present

## 2020-07-18 LAB — COMPREHENSIVE METABOLIC PANEL
ALT: 42 U/L — ABNORMAL HIGH (ref 0–35)
AST: 26 U/L (ref 0–37)
Albumin: 4.8 g/dL (ref 3.5–5.2)
Alkaline Phosphatase: 89 U/L (ref 39–117)
BUN: 27 mg/dL — ABNORMAL HIGH (ref 6–23)
CO2: 26 mEq/L (ref 19–32)
Calcium: 10 mg/dL (ref 8.4–10.5)
Chloride: 104 mEq/L (ref 96–112)
Creatinine, Ser: 1.02 mg/dL (ref 0.40–1.20)
GFR: 59.74 mL/min — ABNORMAL LOW (ref >60.00)
Glucose, Bld: 131 mg/dL — ABNORMAL HIGH (ref 70–99)
Potassium: 3.8 mEq/L (ref 3.5–5.1)
Sodium: 141 mEq/L (ref 135–145)
Total Bilirubin: 0.4 mg/dL (ref 0.2–1.2)
Total Protein: 7.8 g/dL (ref 6.0–8.3)

## 2020-07-18 LAB — LIPID PANEL
Cholesterol: 201 mg/dL — ABNORMAL HIGH (ref 0–200)
HDL: 63.5 mg/dL (ref >39.00)
LDL Cholesterol: 108 mg/dL — ABNORMAL HIGH (ref 0–99)
NonHDL: 137.41
Total CHOL/HDL Ratio: 3
Triglycerides: 149 mg/dL (ref 0.0–149.0)
VLDL: 29.8 mg/dL (ref 0.0–40.0)

## 2020-07-18 LAB — HEMOGLOBIN A1C: Hgb A1c MFr Bld: 7.4 % — ABNORMAL HIGH (ref 4.6–6.5)

## 2020-07-18 NOTE — Telephone Encounter (Signed)
I spoke with Dr. Terese Door. Patient needs an appointment for a new issue for neuropathy of the feet (not CTS) and a follow up for migraines/headaches. You can give patient any one of my appointments, doesn't matter what appointment type (can be new, follow up, botox spot etc). You can even put her in an emg spot which would give me more time to talk about both issues or put her at the end of a day like add a 4pm. If I don;t have anything by the end of July let me know and I will make her a spot, thanks (cc Dr. Aundra Dubin on this note)

## 2020-07-18 NOTE — Patient Instructions (Addendum)
Cal and schedule mammogram please [place]  Consider alpha lipoic acid 600 mg 2x per day for neuropathy    Hyperglycemia Hyperglycemia occurs when the level of sugar (glucose) in the blood is too high. Glucose is a type of sugar that provides the body's main source of energy. Certain hormones (insulin and glucagon) control the level of glucose in the blood. Insulin lowers blood glucose, and glucagon increases blood glucose. Hyperglycemia can result from not having enough insulin in the bloodstream, or from the body not responding normally toinsulin. Hyperglycemia occurs most often in people who have diabetes (diabetes mellitus), but it can happen in people who do not have diabetes. It can develop quickly, and it can be life-threatening if it causes you to become severely dehydrated (diabetic ketoacidosis or hyperglycemic hyperosmolar state). Severe hyperglycemia is a medical emergency. For most people with diabetes, a blood glucose level above 240 mg/dL isconsidered hyperglycemia. What are the causes? If you have diabetes, hyperglycemia may be caused by: Medicines that increase blood glucose or affect your diabetes control. Getting less physical activity. Eating more than planned. Being sick or injured, having an infection, or having surgery. Stress. Not giving yourself enough insulin (if you are taking insulin). If you have undiagnosed diabetes, this may be the reason you have hyperglycemia. If you do not have diabetes, hyperglycemia may be caused by: Certain medicines, including: Steroid medicines. Beta-blockers. Epinephrine. Thiazide diuretics. Stress. Having a serious illness, an infection, or surgery. Diseases of the pancreas. What increases the risk? Hyperglycemia is more likely to develop in people who have risk factors for diabetes, such as: Having a family member with diabetes. Certain conditions in which the body's disease-fighting system (immune system) attacks itself  (autoimmune disorders). Being overweight or obese. Having an inactive (sedentary) lifestyle. Having been diagnosed with insulin resistance. Having a history of prediabetes, gestational diabetes, or polycystic ovarian syndrome (PCOS). What are the signs or symptoms? Hyperglycemia may not cause any symptoms. If you do have symptoms, they may include: Increased thirst. Needing to urinate more often than usual. Hunger. Feeling very tired. Blurry vision. Other symptoms may develop if hyperglycemia gets worse, such as: Dry mouth. Abdominal pain. Loss of appetite. Fruity-smelling breath. Weakness. Unexpected weight loss. Tingling or numbness in the hands or feet. Headache. Cuts or bruises that are slow to heal. How is this diagnosed? Hyperglycemia is diagnosed with a blood test to measure your blood glucose level. This blood test is usually done while you are having symptoms. Your health care provider may also do a physical exam and review your medicalhistory. You may have more tests to determine the cause of your hyperglycemia, such as: A fasting blood glucose (FBG) test. You will not be allowed to eat (you will fast) for at least 8 hours before a blood sample is taken. An A1C blood test. This provides information about blood glucose control over the previous 2-3 months. An oral glucose tolerance test (OGTT). This measures your blood glucose at two times: After fasting. This is your baseline blood glucose level. 2 hours after drinking a beverage that contains glucose. How is this treated? Treatment depends on the cause of your hyperglycemia. Treatment may include: Taking medicine to regulate your blood glucose levels. If you take insulin or other diabetes medicines, your medicine or dosage may be adjusted. Lifestyle changes, such as exercising more, eating healthier foods, or losing weight. Treating an illness or infection. Checking your blood glucose more often. Stopping or reducing  steroid medicines. If your hyperglycemia becomes severe  and it results in diabetic ketoacidosis or hyperglycemic hyperosmolar state, you must be hospitalized and given IV fluidsand IV insulin. Follow these instructions at home: General instructions Take over-the-counter and prescription medicines only as told by your health care provider. Do not use any products that contain nicotine or tobacco. These products include cigarettes, chewing tobacco, and vaping devices, such as e-cigarettes. If you need help quitting, ask your health care provider. If you drink alcohol: Limit how much you have to: 0-1 drink a day for women who are not pregnant. 0-2 drinks a day for men. Know how much alcohol is in a drink. In the [place] one drink equals one 12 oz bottle of beer (355 mL), one 5 oz glass of wine (148 mL), or one 1 oz glass of hard liquor (44 mL). Learn to manage stress. If you need help with this, ask your health care provider. Do exercises as told by your health care provider. Keep all follow-up visits. This is important. Eating and drinking  Maintain a healthy weight. Stay hydrated, especially when you exercise, get sick, or spend time in hot temperatures. Drink enough fluid to keep your urine pale yellow.  If you have diabetes:  Know the symptoms of hyperglycemia. Follow your diabetes management plan as told by your health care provider. Make sure you: Take your insulin and medicines as told. Follow your exercise plan. Follow your meal plan. Eat on time, and do not skip meals. Check your blood glucose as often as told. Make sure to check your blood glucose before and after exercise. If you exercise longer or in a different way, check your blood glucose more often. Follow your sick day plan whenever you cannot eat or drink normally. Make this plan in advance with your health care provider. Share your diabetes management plan with people in your workplace, school, and household. Check your  urine for ketones when you are ill and as told by your health care provider. Carry a medical alert card or wear medical alert jewelry.  Where to find more information American Diabetes Association: www.diabetes.org Contact a health care provider if: Your blood glucose is at or above 240 mg/dL (13.3 mmol/L) for 2 days in a row. You have problems keeping your blood glucose in your target range. You have frequent episodes of hyperglycemia. You have signs of illness, such as nausea, vomiting, or fever. Get help right away if: Your blood glucose monitor reads "high" even when you are taking insulin. You have trouble breathing. You have a change in how you think, feel, or act (mental status). You have nausea or vomiting that does not go away. These symptoms may represent a serious problem that is an emergency. Do not wait to see if the symptoms will go away. Get medical help right away. Call your local emergency services (911 in the [place] Do not drive yourself to the hospital. Summary Hyperglycemia occurs when the level of sugar (glucose) in the blood is too high. Hyperglycemia can happen with or without diabetes, and severe hyperglycemia can be life-threatening. Hyperglycemia is diagnosed with a blood test to measure your blood glucose level. This blood test is usually done while you are having symptoms. Your health care provider may also do a physical exam and review your medical history. If you have diabetes, follow your diabetes management plan as told by your health care provider. Contact your health care provider if you have problems keeping your blood glucose in your target range. This information is not intended  to replace advice given to you by your health care provider. Make sure you discuss any questions you have with your healthcare provider. Document Revised: 10/21/2019 Document Reviewed: 10/21/2019 Elsevier Patient Education  2022 [employer].  Neuropathic Pain Neuropathic pain  is pain caused by damage to the nerves that are responsible for certain sensations in your body (sensory nerves). The pain can be caused by: Damage to the sensory nerves that send signals to your spinal cord and brain (peripheral nervous system). Damage to the sensory nerves in your brain or spinal cord (central nervous system). Neuropathic pain can make you more sensitive to pain. Even a minor sensation can feel very painful. This is usually a long-term condition that can be difficult to treat. The type of pain differs from person to person. It may: Start suddenly (acute), or it may develop slowly and last for a long time (chronic). Come and go as damaged nerves heal, or it may stay at the same level for years. Cause emotional distress, loss of sleep, and a lower quality of life. What are the causes? The most common cause of this condition is diabetes. Many other diseases and conditions can also cause neuropathic pain. Causes of neuropathic pain can be classified as: Toxic. This is caused by medicines and chemicals. The most common cause of toxic neuropathic pain is damage from cancer treatments (chemotherapy). Metabolic. This can be caused by: Diabetes. This is the most common disease that damages the nerves. Lack of vitamin B from long-term alcohol abuse. Traumatic. Any injury that cuts, crushes, or stretches a nerve can cause damage and pain. A common example is feeling pain after losing an arm or leg (phantom limb pain). Compression-related. If a sensory nerve gets trapped or compressed for a long period of time, the blood supply to the nerve can be cut off. Vascular. Many blood vessel diseases can cause neuropathic pain by decreasing blood supply and oxygen to nerves. Autoimmune. This type of pain results from diseases in which the body's defense system (immune system) mistakenly attacks sensory nerves. Examples of autoimmune diseases that can cause neuropathic pain include lupus and multiple  sclerosis. Infectious. Many types of viral infections can damage sensory nerves and cause pain. Shingles infection is a common cause of this type of pain. Inherited. Neuropathic pain can be a symptom of many diseases that are passed down through families (genetic). What increases the risk? You are more likely to develop this condition if: You have diabetes. You smoke. You drink too much alcohol. You are taking certain medicines, including medicines that kill cancer cells (chemotherapy) or that treat immune system disorders. What are the signs or symptoms? The main symptom is pain. Neuropathic pain is often described as: Burning. Shock-like. Stinging. Hot or cold. Itching. How is this diagnosed? No single test can diagnose neuropathic pain. It is diagnosed based on: Physical exam and your symptoms. Your health care provider will ask you about your pain. You may be asked to use a pain scale to describe how bad your pain is. Tests. These may be done to see if you have a high sensitivity to pain and to help find the cause and location of any sensory nerve damage. They include: Nerve conduction studies to test how well nerve signals travel through your sensory nerves (electrodiagnostic testing). Stimulating your sensory nerves through electrodes on your skin and measuring the response in your spinal cord and brain (somatosensory evoked potential). Imaging studies, such as: X-rays. CT scan. MRI. How is this treated?  Treatment for neuropathic pain may change over time. You may need to try different treatment options or a combination of treatments. Some options include: Treating the underlying cause of the neuropathy, such as diabetes, kidney disease, or vitamin deficiencies. Stopping medicines that can cause neuropathy, such as chemotherapy. Medicine to relieve pain. Medicines may include: Prescription or over-the-counter pain medicine. Anti-seizure medicine. Antidepressant  medicines. Pain-relieving patches that are applied to painful areas of skin. A medicine to numb the area (local anesthetic), which can be injected as a nerve block. Transcutaneous nerve stimulation. This uses electrical currents to block painful nerve signals. The treatment is painless. Alternative treatments, such as: Acupuncture. Meditation. Massage. Physical therapy. Pain management programs. Counseling. Follow these instructions at home: Medicines  Take over-the-counter and prescription medicines only as told by your health care provider. Do not drive or use heavy machinery while taking prescription pain medicine. If you are taking prescription pain medicine, take actions to prevent or treat constipation. Your health care provider may recommend that you: Drink enough fluid to keep your urine pale yellow. Eat foods that are high in fiber, such as fresh fruits and vegetables, whole grains, and beans. Limit foods that are high in fat and processed sugars, such as fried or sweet foods. Take an over-the-counter or prescription medicine for constipation.  Lifestyle  Have a good support system at home. Consider joining a chronic pain support group. Do not use any products that contain nicotine or tobacco, such as cigarettes and e-cigarettes. If you need help quitting, ask your health care provider. Do not drink alcohol.  General instructions Learn as much as you can about your condition. Work closely with all your health care providers to find the treatment plan that works best for you. Ask your health care provider what activities are safe for you. Keep all follow-up visits as told by your health care provider. This is important. Contact a health care provider if: Your pain treatments are not working. You are having side effects from your medicines. You are struggling with tiredness (fatigue), mood changes, depression, or anxiety. Summary Neuropathic pain is pain caused by damage  to the nerves that are responsible for certain sensations in your body (sensory nerves). Neuropathic pain may come and go as damaged nerves heal, or it may stay at the same level for years. Neuropathic pain is usually a long-term condition that can be difficult to treat. Consider joining a chronic pain support group. This information is not intended to replace advice given to you by your health care provider. Make sure you discuss any questions you have with your healthcare provider. Document Revised: 04/29/2018 Document Reviewed: 01/23/2017 Elsevier Patient Education  2022 [employer].

## 2020-07-18 NOTE — Telephone Encounter (Signed)
Patient seen in office with Dr Olivia Mackie McLean-Scocuzza today at 11:30. Will discuss at appointment

## 2020-07-18 NOTE — Progress Notes (Signed)
Chief Complaint  Patient presents with   Follow-up   F/u  1. Hyperglycemia cbgs in 300s to 350s after recent cortisone injection right shoulder h/o diabetes on jardiance 10 mg qd but she took a dose of metformin left over to help with elevated sugar but this did not help Declines injections as tx 2. Obesity now overweight disc BMI goal of 25 would be 139 wt has lost from 200s to current wt 169 her goal is 150s down 3 lbs since 02/2020  3. Neuropathy instead of pins and needles feels like scissors and knives b/l legs and h/o migraines messaged Dr. Jaynee Eagles to sch f/u with this patient and they will call her   Review of Systems  Constitutional:  Positive for weight loss.  HENT:  Negative for hearing loss.   Eyes:  Negative for blurred vision.  Respiratory:  Negative for shortness of breath.   Cardiovascular:  Negative for chest pain.  Gastrointestinal:  Negative for abdominal pain.  Musculoskeletal:  Negative for falls and joint pain.  Skin:  Negative for rash.  Neurological:  Positive for tingling and sensory change.  Psychiatric/Behavioral:  Negative for depression.   Past Medical History:  Diagnosis Date   Allergy    Arthritis    Basal cell carcinoma    nose, left cheek, right upper lip, right posterior shoulder most recent 6 years ago on left cheek   Bleeding    Chicken pox    Complication of anesthesia    blood pressure elevated after gastric sleeve surgery in recovery, pt does not want mask over face   COVID-19    03/01/20 had MAB 03/03/20 and felt better next day 03/04/20   Depression    Diabetes mellitus without complication ([place])    pre-diabetic currently, was diabetic prior to gastric sleeve surgery    Disorder of right rotator cuff    10/03/19 MRI right shoulder abnormal Dr. Maurine Cane   Fatty liver    Gout    Hematuria 10/15/2018   History of shingles    Hyperlipidemia    Hypertension    no problems since losing weight after gastric sleeve surgery   IBS (irritable bowel  syndrome)    Insomnia    Migraine    Obesity    Osteopenia    Past Surgical History:  Procedure Laterality Date   ABDOMINAL HYSTERECTOMY     no h/o abnormal pap still has 1 ovary left   ANKLE ARTHROSCOPY Left 05/29/2016   Procedure: ANKLE ARTHROSCOPY;  Surgeon: [name], DPM;  Location: [place];  Service: Podiatry;  Laterality: Left;   APPENDECTOMY     basal cell removed     [place] PROPOFOL N/A 01/31/2019   Procedure: COLONOSCOPY WITH PROPOFOL;  Surgeon: Lin Landsman, MD;  Location[facility] ENDOSCOPY;  Service: Gastroenterology;  Laterality: N/A;   cyst removed from wrist     ESOPHAGOGASTRODUODENOSCOPY (EGD) WITH PROPOFOL N/A 01/31/2019   Procedure: ESOPHAGOGASTRODUODENOSCOPY (EGD) WITH PROPOFOL;  Surgeon: Lin Landsman, MD;  Location: [place];  Service: Gastroenterology;  Laterality: N/A;   FLEXIBLE SIGMOIDOSCOPY N/A 05/21/2020   Procedure: FLEXIBLE SIGMOIDOSCOPY;  Surgeon: Lin Landsman, MD;  Location[facility]COPY;  Service: Gastroenterology;  Laterality: N/A;   FOOT SURGERY     left foot 11/2016 Dr. Laymond Purser    left elbow surgery     07/2017 Dr Leanor Kail epicondylitis  MOHS SURGERY     x 2 face nose and left cheek    OSTEOCHONDROMA EXCISION Left 05/29/2016   Procedure: OSTEOCHONDRAL DRILLING TALUS;  Surgeon: [name], DPM;  Location: [place];  Service: Podiatry;  Laterality: Left;   PLANTAR FASCIA RELEASE Left 05/29/2016   Procedure: ENDOSCOPIC PLANTAR FASCIOTOMY;  Surgeon: [name], DPM;  Location: [place];  Service: Podiatry;  Laterality: Left;   ROTATOR CUFF REPAIR     ? right likely shoulder   STOMACH SURGERY  2017   gastric sleeve surgery   TENNIS ELBOW RELEASE/NIRSCHEL PROCEDURE Left 08/05/2017   Procedure: TENNIS ELBOW RELEASE/NIRSCHEL PROCEDURE;  Surgeon: Leanor Kail, MD;  Location: [facility] ORS;  Service: Orthopedics;  Laterality: Left;   TUBAL LIGATION      wedge resection of ovary  1982   Family History  Problem Relation Age of Onset   Heart disease Mother    Breast cancer Mother 50       metastatic, now hip, spine, and leg    Arthritis Mother    Hyperlipidemia Mother    Cancer Mother        spinal surgery. breast dx'ed age 69 now metastatic 77 as of 07/13/18   Cystic fibrosis Mother        carrier    Heart disease Father    Lung cancer Father        smoker   Hyperlipidemia Father    Hypertension Father    Diabetes Sister    Stroke Sister    Other Sister        covid +   Diabetes Maternal Aunt    Diabetes Maternal Uncle    Diabetes Maternal Grandfather    Hyperlipidemia Maternal Grandfather    Arthritis Maternal Grandmother    Sudden death Cousin    Diabetes Maternal Aunt    Diabetes Sister    Cystic fibrosis Other        great niece    Colon cancer Neg Hx    Ovarian cancer Neg Hx    Social History   Socioeconomic History   Marital status: Married    Spouse name: Not on file   Number of children: 3   Years of education: Not on file   Highest education level: Some college, no degree  Occupational History   Not on file  Tobacco Use   Smoking status: Never   Smokeless tobacco: Never  Vaping Use   Vaping Use: Never used  Substance and Sexual Activity   Alcohol use: Never   Drug use: Never   Sexual activity: Yes    Partners: Male    Birth control/protection: Surgical  Other Topics Concern   Not on file  Social History Narrative   Married x 37 years as of 10/2017    Kids 3 boys youngest is Eddie Dibbles   Write childrens books    Retired from [employer] 01/2017    Enjoys traveling    Right handed   Caffeine: quit 2009   Social Determinants of Health   Financial Resource Strain: Low Risk    Difficulty of Paying Living Expenses: Not hard at all  Food Insecurity: Not on file  Transportation Needs: Not on file  Physical Activity: Not on file  Stress: Not on file  Social Connections: Not on file  Intimate Partner  Violence: Not on file   Current Meds  Medication Sig   amitriptyline (ELAVIL) 25 MG tablet TAKE 1 TABLET (25 MG TOTAL) BY MOUTH AT BEDTIME. INCREASE  TO '50MG'$  AFTER 2 WEEKS IF NEEDED   amLODipine (NORVASC) 5 MG tablet Take 1 tablet (5 mg total) by mouth daily. And stop 2.5 mg daily (2 pills)   Cholecalciferol (DIALYVITE VITAMIN D 5000 PO) Take 5,000 Units by mouth every other day.   Continuous Blood Gluc Sensor (FREESTYLE LIBRE 2 SENSOR) MISC Use to check glucose twice daily   dicyclomine (BENTYL) 20 MG tablet Take 20 mg by mouth 2 (two) times daily as needed for spasms.   empagliflozin (JARDIANCE) 10 MG TABS tablet Take 1 tablet (10 mg total) by mouth daily before breakfast.   fexofenadine (ALLEGRA) 180 MG tablet Take 1 tablet (180 mg total) by mouth daily as needed for allergies or rhinitis.   montelukast (SINGULAIR) 10 MG tablet Take 1 tablet (10 mg total) by mouth at bedtime.   Multiple Vitamin (MULTIVITAMIN) tablet Take 1 tablet by mouth daily.   omeprazole (PRILOSEC) 40 MG capsule TAKE 1 CAPSULE (40 MG TOTAL) BY MOUTH DAILY BEFORE SUPPER.   ondansetron (ZOFRAN-ODT) 4 MG disintegrating tablet Take 1 tablet (4 mg total) by mouth every 8 (eight) hours as needed. for nausea   potassium chloride (MICRO-K) 10 MEQ CR capsule Take 2 capsules (20 mEq total) by mouth daily.   Rimegepant Sulfate (NURTEC) 75 MG TBDP Take 75 mg by mouth daily as needed (take for abortive therapy of migraine, no more than 1 tablet in 24 hours or 10 per month).   rosuvastatin (CRESTOR) 20 MG tablet Take 1 tablet (20 mg total) by mouth every Monday, Wednesday, and Friday.   topiramate (TOPAMAX) 50 MG tablet Take 1 tablet (50 mg total) by mouth 2 (two) times daily.   triamcinolone (KENALOG) 0.1 % Apply 1 application topically 2 (two) times daily. Prn itching hives on body not face   triamterene-hydrochlorothiazide (MAXZIDE-25) 37.5-25 MG tablet Take 0.5 tablets by mouth daily. In the am   zolpidem (AMBIEN CR) 12.5 MG CR  tablet Take 1 tablet (12.5 mg total) by mouth at bedtime as needed.   Allergies  Allergen Reactions   Tetracyclines & Related Swelling and Rash    FACE SWELL, RASH ON CHEST   Amoxicillin    Alcohol Rash    ABDOMINAL CRAMPS, drinking alcohol   Azithromycin Rash   Metoprolol Diarrhea   Recent Results (from the past 2160 hour(s))  Comprehensive metabolic panel     Status: Abnormal   Collection Time: 04/23/20  2:26 PM  Result Value Ref Range   Sodium 140 135 - 145 mEq/L   Potassium 3.1 (L) 3.5 - 5.1 mEq/L   Chloride 102 96 - 112 mEq/L   CO2 28 19 - 32 mEq/L   Glucose, Bld 191 (H) 70 - 99 mg/dL   BUN 23 6 - 23 mg/dL   Creatinine, Ser 1.04 0.40 - 1.20 mg/dL   Total Bilirubin 0.3 0.2 - 1.2 mg/dL   Alkaline Phosphatase 91 39 - 117 U/L   AST 42 (H) 0 - 37 U/L   ALT 51 (H) 0 - 35 U/L   Total Protein 7.3 6.0 - 8.3 g/dL   Albumin 4.5 3.5 - 5.2 g/dL   GFR 58.46 (L) >60.00 mL/min    Comment: Calculated using the CKD-EPI Creatinine Equation (2021)   Calcium 10.2 8.4 - 10.5 mg/dL  CBC with Differential/Platelet     Status: None   Collection Time: 04/23/20  2:26 PM  Result Value Ref Range   WBC 9.6 4.0 - 10.5 K/uL   RBC 4.58 3.87 - 5.11 Mil/uL  Hemoglobin 13.0 12.0 - 15.0 g/dL   HCT 39.0 36.0 - 46.0 %   MCV 85.1 78.0 - 100.0 fl   MCHC 33.3 30.0 - 36.0 g/dL   RDW 14.4 11.5 - 15.5 %   Platelets 295.0 150.0 - 400.0 K/uL   Neutrophils Relative % 59.3 43.0 - 77.0 %   Lymphocytes Relative 31.5 12.0 - 46.0 %   Monocytes Relative 6.3 3.0 - 12.0 %   Eosinophils Relative 2.0 0.0 - 5.0 %   Basophils Relative 0.9 0.0 - 3.0 %   Neutro Abs 5.7 1.4 - 7.7 K/uL   Lymphs Abs 3.0 0.7 - 4.0 K/uL   Monocytes Absolute 0.6 0.1 - 1.0 K/uL   Eosinophils Absolute 0.2 0.0 - 0.7 K/uL   Basophils Absolute 0.1 0.0 - 0.1 K/uL  Pathologist smear review     Status: Abnormal   Collection Time: 04/23/20  2:26 PM  Result Value Ref Range   Path Rev WBC Appear normal.    Path Rev RBC Appear normal.    Path Rev  PLTs Appear normal.    PATH INTERP BLD-IMP Comment     Comment: No pathologic diagnosis.   PATHOLOGIST NAME Comment     Comment: Reviewed by:  Davonna Belling, MD, Pathologist   WBC 9.7 3.4 - 10.8 x10E3/uL   RBC 4.71 3.77 - 5.28 x10E6/uL   Hemoglobin 13.3 11.1 - 15.9 g/dL   Hematocrit 40.3 34.0 - 46.6 %   MCV 86 79 - 97 fL   MCH 28.2 26.6 - 33.0 pg   MCHC 33.0 31.5 - 35.7 g/dL   RDW 13.6 11.7 - 15.4 %   Platelets 302 150 - 450 x10E3/uL   Neutrophils 57 Not Estab. %   Lymphs 33 Not Estab. %   Monocytes 7 Not Estab. %   Eos 2 Not Estab. %   Basos 1 Not Estab. %   Neutrophils Absolute 5.5 1.4 - 7.0 x10E3/uL   Lymphocytes Absolute 3.2 (H) 0.7 - 3.1 x10E3/uL   Monocytes Absolute 0.7 0.1 - 0.9 x10E3/uL   EOS (ABSOLUTE) 0.2 0.0 - 0.4 x10E3/uL   Basophils Absolute 0.1 0.0 - 0.2 x10E3/uL   Immature Granulocytes 0 Not Estab. %   Immature Grans (Abs) 0.0 0.0 - 0.1 x10E3/uL  CBC with Differential/Platelet     Status: None   Collection Time: 05/03/20 12:43 PM  Result Value Ref Range   WBC 8.9 4.0 - 10.5 K/uL   RBC 4.76 3.87 - 5.11 Mil/uL   Hemoglobin 13.6 12.0 - 15.0 g/dL   HCT 40.7 36.0 - 46.0 %   MCV 85.7 78.0 - 100.0 fl   MCHC 33.4 30.0 - 36.0 g/dL   RDW 14.6 11.5 - 15.5 %   Platelets 323.0 150.0 - 400.0 K/uL   Neutrophils Relative % 58.8 43.0 - 77.0 %   Lymphocytes Relative 32.2 12.0 - 46.0 %   Monocytes Relative 6.0 3.0 - 12.0 %   Eosinophils Relative 1.9 0.0 - 5.0 %   Basophils Relative 1.1 0.0 - 3.0 %   Neutro Abs 5.2 1.4 - 7.7 K/uL   Lymphs Abs 2.9 0.7 - 4.0 K/uL   Monocytes Absolute 0.5 0.1 - 1.0 K/uL   Eosinophils Absolute 0.2 0.0 - 0.7 K/uL   Basophils Absolute 0.1 0.0 - 0.1 K/uL  Basic metabolic panel     Status: Abnormal   Collection Time: 05/03/20 12:43 PM  Result Value Ref Range   Sodium 141 135 - 145 mEq/L   Potassium 4.1 3.5 -  5.1 mEq/L   Chloride 104 96 - 112 mEq/L   CO2 28 19 - 32 mEq/L   Glucose, Bld 106 (H) 70 - 99 mg/dL   BUN 13 6 - 23 mg/dL    Creatinine, Ser 0.97 0.40 - 1.20 mg/dL   GFR 63.55 >60.00 mL/min    Comment: Calculated using the CKD-EPI Creatinine Equation (2021)   Calcium 9.6 8.4 - 10.5 mg/dL  Glucose, capillary     Status: Abnormal   Collection Time: 05/21/20 11:05 AM  Result Value Ref Range   Glucose-Capillary 128 (H) 70 - 99 mg/dL    Comment: Glucose reference range applies only to samples taken after fasting for at least 8 hours.  Surgical pathology     Status: None   Collection Time: 05/21/20 11:48 AM  Result Value Ref Range   SURGICAL PATHOLOGY      SURGICAL PATHOLOGY CASE: [id number] PATIENT: Tora Duck Surgical Pathology Report     Specimen Submitted: A. Colon, random; cbx  Clinical History: Rectal bleeding K62.5 diarrhea R19.7    DIAGNOSIS: A. COLON, RANDOM; COLD BIOPSY: - BENIGN COLONIC MUCOSA WITH NO SIGNIFICANT HISTOPATHOLOGIC CHANGE. - NEGATIVE FOR FEATURES OF MICROSCOPIC COLITIS. - NEGATIVE FOR DYSPLASIA AND MALIGNANCY.  GROSS DESCRIPTION: A. Labeled: cbx random colon chronic diarrhea Received: Formalin Collection time: 11:48 AM on 05/21/2020 Placed into formalin time: 11:48 AM on 05/21/2020 Tissue fragment(s): Multiple Size: Aggregate, 1.5 x 0.5 x 0.2 cm Description: Tan soft tissue fragments Entirely submitted in 1 cassette.  RB 05/21/2020   Final Diagnosis performed by Allena Napoleon, MD.   Electronically signed 05/22/2020 8:16:26AM The electronic signature indicates that the named Attending Pathologist has evaluated the specimen Technical component performed at [facility], [address] [place], [place] [address] Lab: [phone] Dir: Rush Farmer, MD, MMM  Professional component performed at [facility], [facility], [place], [place], [place] [address] Lab: [phone] Dir: Dellia Nims. Reuel Derby, MD   HM DIABETES EYE EXAM     Status: None   Collection Time: 06/04/20 12:00 AM  Result Value Ref Range   HM Diabetic Eye Exam No Retinopathy No  Retinopathy    Comment: [facility]  GI Profile, Stool, PCR     Status: None   Collection Time: 06/06/20 10:03 AM  Result Value Ref Range   Campylobacter Not Detected Not Detected   C difficile toxin A/B Not Detected Not Detected   Plesiomonas shigelloides Not Detected Not Detected   Salmonella Not Detected Not Detected   Vibrio Not Detected Not Detected   Vibrio cholerae Not Detected Not Detected   Yersinia enterocolitica Not Detected Not Detected   Enteroaggregative E coli Not Detected Not Detected   Enteropathogenic E coli Not Detected Not Detected   Enterotoxigenic E coli Not Detected Not Detected   Shiga-toxin-producing E coli Not Detected Not Detected   E coli M841 Not applicable Not Detected   Shigella/Enteroinvasive E coli Not Detected Not Detected   Cryptosporidium Not Detected Not Detected   Cyclospora cayetanensis Not Detected Not Detected   Entamoeba histolytica Not Detected Not Detected   Giardia lamblia Not Detected Not Detected   Adenovirus F 40/41 Not Detected Not Detected   Astrovirus Not Detected Not Detected   Norovirus GI/GII Not Detected Not Detected   Rotavirus A Not Detected Not Detected   Sapovirus Not Detected Not Detected  Pancreatic elastase, fecal     Status: None   Collection Time: 06/06/20 10:03 AM  Result Value Ref Range   Pancreatic Elastase, Fecal  237 >200 ug Elast./g    Comment:        Severe Pancreatic Insufficiency:          <100        Moderate Pancreatic Insufficiency:   100 - 200        Normal:                                   >200    Objective  Body mass index is 29.94 kg/m. Wt Readings from Last 3 Encounters:  07/18/20 169 lb (76.7 kg)  06/05/20 175 lb 6 oz (79.5 kg)  05/21/20 176 lb (79.8 kg)   Temp Readings from Last 3 Encounters:  07/18/20 97.8 F (36.6 C) (Oral)  06/05/20 97.7 F (36.5 C) (Oral)  05/21/20 (!) 96.8 F (36 C) (Temporal)   BP Readings from Last 3 Encounters:  07/18/20 110/84  06/05/20 117/83  05/21/20  (!) 143/74   Pulse Readings from Last 3 Encounters:  07/18/20 65  06/05/20 86  05/21/20 92    Physical Exam Vitals and nursing note reviewed.  Constitutional:      Appearance: Normal appearance. She is well-developed and well-groomed.  HENT:     Head: Normocephalic and atraumatic.  Eyes:     Conjunctiva/sclera: Conjunctivae normal.     Pupils: Pupils are equal, round, and reactive to light.  Cardiovascular:     Rate and Rhythm: Normal rate and regular rhythm.     Heart sounds: Normal heart sounds.  Pulmonary:     Effort: Pulmonary effort is normal.     Breath sounds: Normal breath sounds.  Skin:    General: Skin is warm and dry.  Neurological:     General: No focal deficit present.     Mental Status: She is alert and oriented to person, place, and time. Mental status is at baseline.     Gait: Gait normal.  Psychiatric:        Attention and Perception: Attention and perception normal.        Mood and Affect: Mood and affect normal.        Speech: Speech normal.        Behavior: Behavior normal. Behavior is cooperative.        Thought Content: Thought content normal.        Cognition and Memory: Cognition and memory normal.        Judgment: Judgment normal.    Assessment  Plan  Hypertension BP at goal on norvasc 5 mg qd maxzide 37.5-25 mg qd associated with diabetes ([place]) with hyperglycemia after cortisone shot right shoulder 8.3 03/2020- Plan: Lipid panel, Hemoglobin A1c, Comprehensive metabolic panel Check M0L  On jardiance 10 mg qd consider increase to 25  Declines injections for now for dm control I.e ozempic   Fatty liver - Plan: Hepatitis A Ab, Total hep B immune neg hep C Immunity status testing - Plan: Hepatitis A Ab, Total  Neuropathy legs and migraines F/u Dr. Jaynee Eagles   HM - bill CPE in future at f/u after 08/18/20  Flu shot utd 10/19/19 or 10/21/19 utd Tdap 01/08/13 Disc shingrix prev still not had  Consider prevnar and pna 23 vaccines in future covid vx  had 3/3 moderna consider 4th dose  hep A vaccine 2/2 04/05/20 will need to check titer in the future h/o fatty liver hep B immune with h/o fatty liver  Hep C neg  MMR  immune   Never smoker, chew congratulated not doing either    mammo 07/05/19 b/l diag normal right axillary mass fat pad likely MSK pain/ttp -screening mammo in 1 year ordered call and sch    Colonoscopy -obtained records 07/15/11 colonoscopy Dr. Candace Cruise nl repeat in 10 years  Colonoscopy 01/31/19 Dr. Marius Ditch IH prep fair hemmorhiods, tubular adenoma f/u in 5 years likely with GI   Pap s/p hysterectomy h.o fibroids ? Cervix intact has appt Dr. Enzo Bi 02/10/17.  -Appt went well 01/2017 ob/gyn f/u in 1 year -per note pap not needed she will f/u with Dr. Marcelline Mates now last seen 03/10/2018  pap 08/19/19 neg neg HPV   DEXA consider age 32    Dermatology sees [place] [facility] q6 months h/o BCC nose and left cheek s/p Mohs and h/o Aks   -seen 06/07/19 f/u 12/05/2019 Dr. Brendolyn Patty 03/26/21    05/2020 [facility] neg retinopathy     Healthy diet and exercise rec.     Provider: Dr. Olivia Mackie McLean-Scocuzza-Internal Medicine

## 2020-07-19 LAB — HEPATITIS A ANTIBODY, TOTAL: Hepatitis A AB,Total: REACTIVE — AB

## 2020-07-23 ENCOUNTER — Encounter: Payer: Self-pay | Admitting: Internal Medicine

## 2020-07-23 DIAGNOSIS — M7501 Adhesive capsulitis of right shoulder: Secondary | ICD-10-CM

## 2020-07-23 HISTORY — DX: Adhesive capsulitis of right shoulder: M75.01

## 2020-07-26 ENCOUNTER — Other Ambulatory Visit: Payer: Self-pay | Admitting: Internal Medicine

## 2020-07-26 DIAGNOSIS — G4700 Insomnia, unspecified: Secondary | ICD-10-CM

## 2020-07-27 NOTE — Telephone Encounter (Signed)
RX Refill:ambien Last Seen:07-18-20 Last ordered:02-07-20

## 2020-07-31 ENCOUNTER — Ambulatory Visit
Admission: RE | Admit: 2020-07-31 | Discharge: 2020-07-31 | Disposition: A | Discharge status: Home / Self Care | Payer: BC Managed Care – PPO | Source: Ambulatory Visit | Attending: Internal Medicine | Admitting: Internal Medicine

## 2020-07-31 ENCOUNTER — Other Ambulatory Visit: Payer: Self-pay

## 2020-07-31 DIAGNOSIS — Z1231 Encounter for screening mammogram for malignant neoplasm of breast: Secondary | ICD-10-CM | POA: Diagnosis not present

## 2020-08-01 ENCOUNTER — Telehealth: Payer: Self-pay

## 2020-08-01 NOTE — Telephone Encounter (Signed)
Pt has another yeast infection and she thinks it is due to being on jardience. Pt would like Dr Warren to call her in a refill on diflucan. Symptoms for three days of itching/burning.

## 2020-08-01 NOTE — Telephone Encounter (Signed)
Patient last seen 07/18/20 jardiance prescribed by Dr Olivia Mackie McLean-Scocuzza.   Please advise

## 2020-08-01 NOTE — Telephone Encounter (Signed)
Pt called back about diflucan

## 2020-08-02 ENCOUNTER — Other Ambulatory Visit: Payer: Self-pay | Admitting: Internal Medicine

## 2020-08-02 DIAGNOSIS — B373 Candidiasis of vulva and vagina: Secondary | ICD-10-CM

## 2020-08-02 DIAGNOSIS — B3731 Acute candidiasis of vulva and vagina: Secondary | ICD-10-CM

## 2020-08-02 MED ORDER — FLUCONAZOLE 150 MG PO TABS: 150.0000 mg | ORAL_TABLET | Freq: Once | ORAL | 2 refills | Status: AC

## 2020-08-02 NOTE — Telephone Encounter (Signed)
Patient calling back in

## 2020-08-02 NOTE — Telephone Encounter (Signed)
Left message to return call 

## 2020-08-02 NOTE — Telephone Encounter (Signed)
Sent if continues and cant tolerate recurrent yeast infections consider alternative to [employer]

## 2020-08-02 NOTE — Telephone Encounter (Signed)
Patient has been informed.

## 2020-08-07 ENCOUNTER — Ambulatory Visit (INDEPENDENT_AMBULATORY_CARE_PROVIDER_SITE_OTHER): Payer: BC Managed Care – PPO | Admitting: Neurology

## 2020-08-07 ENCOUNTER — Encounter: Payer: Self-pay | Admitting: Neurology

## 2020-08-07 VITALS — BP 137/84 | HR 88 | Ht 62.5 in | Wt 162.0 lb

## 2020-08-07 DIAGNOSIS — G43709 Chronic migraine without aura, not intractable, without status migrainosus: Secondary | ICD-10-CM

## 2020-08-07 DIAGNOSIS — E0842 Diabetes mellitus due to underlying condition with diabetic polyneuropathy: Secondary | ICD-10-CM

## 2020-08-07 MED ORDER — GABAPENTIN 300 MG PO CAPS: 300.0000 mg | ORAL_CAPSULE | Freq: Three times a day (TID) | ORAL | 11 refills | Status: DC | PRN

## 2020-08-07 MED ORDER — KETOROLAC TROMETHAMINE 60 MG/2ML IM SOLN
60.0000 mg | Freq: Once | INTRAMUSCULAR | Status: AC
  Administered 2020-08-07: 60 mg via INTRAMUSCULAR

## 2020-08-07 NOTE — Patient Instructions (Addendum)
-May consider daily alpha lipoic acid which is an antioxidant that may reduce free radical oxidative stress associated with diabetic polyneuropathy, existing evidence suggests that alpha lipoic acid significantly reduces stabbing, lancinating and burning pain and diabetic neuropathy with its onset of action as early as 1-2 weeks. 600mg  1-2 x a day - Gabapentin as needed for pain - Topical OTC lidocaine cream and Capsaicin (I can also make you cream with gabapentin and amitriptyline and other ingredients) - Qutenza - new - Try for cramping; Tonic Water with quinine or try yellow mustard packets  Diabetic Neuropathy Diabetic neuropathy refers to nerve damage that is caused by diabetes. Over time, people with diabetes can develop nerve damage throughout the body. There are several types of diabetic neuropathy: Peripheral neuropathy. This is the most common type of diabetic neuropathy. It damages the nerves that carry signals between the spinal cord and other parts of the body (peripheral nerves). This usually affects nerves in the feet, legs, hands, and arms. Autonomic neuropathy. This type causes damage to nerves that control involuntary functions (autonomic nerves). Involuntary functions are functions of the body that you do not control. They include heartbeat, body temperature, blood pressure, urination, digestion, sweating, sexual function, or response to changes in blood glucose. Focal neuropathy. This type of nerve damage affects one area of the body, such as an arm, a leg, or the face. The injury may involve one nerve or a small group of nerves. Focal neuropathy can be painful and unpredictable. It occurs most often in older adults with diabetes. This often develops suddenly, but usually improves over time and does not cause long-term problems. Proximal neuropathy. This type of nerve damage affects the nerves of the thighs, hips, buttocks, or legs. It causes severe pain, weakness, and muscle death  (atrophy), usually in the thigh muscles. It is more common among older men and people who have type 2 diabetes. The length of recovery time may vary. What are the causes? Peripheral, autonomic, and focal neuropathies are caused by diabetes that is not well controlled with treatment. The cause of proximal neuropathy is not known, but it may be caused by inflammation related to uncontrolled bloodglucose levels. What are the signs or symptoms? Peripheral neuropathy Peripheral neuropathy develops slowly over time. When the nerves of the feet and legs no longer work, you may experience: Burning, stabbing, or aching pain in the legs or feet. Pain or cramping in the legs or feet. Loss of feeling (numbness) and inability to feel pressure or pain in the feet. This can lead to: Thick calluses or sores on areas of constant pressure. Ulcers. Reduced ability to feel temperature changes. Foot deformities. Muscle weakness. Loss of balance or coordination. Autonomic neuropathy The symptoms of autonomic neuropathy vary depending on which nerves are affected. Symptoms may include: Problems with digestion, such as: Nausea or vomiting. Poor appetite. Bloating. Diarrhea or constipation. Trouble swallowing. Losing weight without trying to. Problems with the heart, blood, and lungs, such as: Dizziness, especially when standing up. Fainting. Shortness of breath. Irregular heartbeat. Bladder problems, such as: Trouble starting or stopping urination. Leaking urine. Trouble emptying the bladder. Urinary tract infections (UTIs). Problems with other body functions, such as: Sweat. You may sweat too much or too little. Temperature. You might get hot easily. Or, you might feel cold more than usual. Sexual function. Men may not be able to get or maintain an erection. Women may have vaginal dryness and difficulty with arousal. Focal neuropathy Symptoms affect only one area of the  body. Common symptoms  include: Numbness. Tingling. Burning pain. Prickling feeling. Very sensitive skin. Weakness. Inability to move (paralysis). Muscle twitching. Muscles getting smaller (wasting). Poor coordination. Double or blurred vision. Proximal neuropathy Sudden, severe pain in the hip, thigh, or buttocks. Pain may spread from the back into the legs (sciatica). Pain and numbness in the arms and legs. Tingling. Loss of bladder control or bowel control. Weakness and wasting of thigh muscles. Difficulty getting up from a seated position. Abdominal swelling. Unexplained weight loss. How is this diagnosed? Diagnosis varies depending on the type of neuropathy your health care providersuspects. Peripheral neuropathy Your health care provider will do a neurologic exam. This exam checks your reflexes, how you move, and what you can feel. You may have other tests, such as: Blood tests. Tests of the fluid that surrounds the spinal cord (lumbar puncture). CT scan. MRI. Checking the nerves that control muscles (electromyogram, or EMG). Checking how quickly signals pass through your nerves (nerve conduction study). Checking a small piece of a nerve using a microscope (biopsy). Autonomic neuropathy You may have tests, such as: Tests to measure your blood pressure and heart rate. You may be secured to an exam table that moves you from a lying position to an upright position (table tilt test). Breathing tests to check your lungs. Tests to check how food moves through the digestive system (gastric emptying tests). Blood, sweat, or urine tests. Ultrasound of your bladder. Spinal fluid tests. Focal neuropathy This condition may be diagnosed with: A neurologic exam. CT scan. MRI. EMG. Nerve conduction study. Proximal neuropathy There is no test to diagnose this type of neuropathy. You may have tests to rule out other possible causes of this type of neuropathy. Tests may include: X-rays of your spine  and lumbar region. Lumbar puncture. MRI. How is this treated? The goal of treatment is to keep nerve damage from getting worse. Treatment may include: Following your diabetes management plan. This will help keep your blood glucose level and your A1C level within your target range. This is the most important treatment. Using prescription pain medicine. Follow these instructions at home: Diabetes management Follow your diabetes management plan as told by your health care provider. Check your blood glucose levels. Keep your blood glucose in your target range. Have your A1C level checked at least two times a year, or as often as told. Take over the counter and prescription medicines only as told by your health care provider. This includes insulin and diabetes medicine.  Lifestyle  Do not use any products that contain nicotine or tobacco, such as cigarettes, e-cigarettes, and chewing tobacco. If you need help quitting, ask your health care provider. Be physically active every day. Include strength training and balance exercises. Follow a healthy meal plan. Work with your health care provider to manage your blood pressure.  General instructions Ask your health care provider if the medicine prescribed to you requires you to avoid driving or using machinery. Check your skin and feet every day for cuts, bruises, redness, blisters, or sores. Keep all follow-up visits. This is important. Contact a health care provider if: You have burning, stabbing, or aching pain in your legs or feet. You are unable to feel pressure or pain in your feet. You develop problems with digestion, such as: Nausea. Vomiting. Bloating. Constipation. Diarrhea. Abdominal pain. You have difficulty with urination, such as: Inability to control when you urinate (incontinence). Inability to completely empty the bladder (retention). You feel as if your heart is racing (  palpitations). You feel dizzy, weak, or faint when  you stand up. Get help right away if: You cannot urinate. You have sudden weakness or loss of coordination. You have trouble speaking. You have pain or pressure in your chest. You have an irregular heartbeat. You have sudden inability to move a part of your body. These symptoms may represent a serious problem that is an emergency. Do not wait to see if the symptoms will go away. Get medical help right away. Call your local emergency services (911 in the [place] Do not drive yourself to the hospital. Summary Diabetic neuropathy is nerve damage that is caused by diabetes. It can cause numbness and pain in the arms, legs, digestive tract, heart, and other body systems. This condition is treated by keeping your blood glucose level and your A1C level within your target range. This can help prevent neuropathy from getting worse. Check your skin and feet every day for cuts, bruises, redness, blisters, or sores. Do not use any products that contain nicotine or tobacco, such as cigarettes, e-cigarettes, and chewing tobacco. This information is not intended to replace advice given to you by your health care provider. Make sure you discuss any questions you have with your healthcare provider. Document Revised: 05/19/2019 Document Reviewed: 05/19/2019 Else[employer] Patient Education  2022 Stapleton and Daroff's neurology in clinical practice (8th ed., pp. 5284- 1929). Elsevier."> [employer] medicine (26th ed., pp. 2489- 2501). Elsevier.">  Peripheral Neuropathy Peripheral neuropathy is a type of nerve damage. It affects nerves that carry signals between the spinal cord and the arms, legs, and the rest of the body (peripheral nerves). It does not affect nerves in the spinal cord or brain. In peripheral neuropathy, one nerve or a group of nerves may be damaged. Peripheral neuropathy is a broad category that includes many specific nerve disorders,like diabetic neuropathy, hereditary neuropathy, and  carpal tunnel syndrome. What are the causes? This condition may be caused by: Diabetes. This is the most common cause of peripheral neuropathy. Nerve injury. Pressure or stress on a nerve that lasts a long time. Lack (deficiency) of B vitamins. This can result from alcoholism, poor diet, or a restricted diet. Infections. Autoimmune diseases, such as rheumatoid arthritis and systemic lupus erythematosus. Nerve diseases that are passed from parent to child (inherited). Some medicines, such as cancer medicines (chemotherapy). Poisonous (toxic) substances, such as lead and mercury. Too little blood flowing to the legs. Kidney disease. Thyroid disease. In some cases, the cause of this condition is not known. What are the signs or symptoms? Symptoms of this condition depend on which of your nerves is damaged. Common symptoms include: Loss of feeling (numbness) in the feet, hands, or both. Tingling in the feet, hands, or both. Burning pain. Very sensitive skin. Weakness. Not being able to move a part of the body (paralysis). Muscle twitching. Clumsiness or poor coordination. Loss of balance. Not being able to control your bladder. Feeling dizzy. Sexual problems. How is this diagnosed? Diagnosing and finding the cause of peripheral neuropathy can be difficult. Your health care provider will take your medical history and do a physical exam. A neurological exam will also be done. This involves checking things that are affected by your brain, spinal cord, and nerves (nervous system). For example, your health care provider will check your reflexes, how youmove, and what you can feel. You may have other tests, such as: Blood tests. Electromyogram (EMG) and nerve conduction tests. These tests check nerve function and how well  the nerves are controlling the muscles. Imaging tests, such as CT scans or MRI to rule out other causes of your symptoms. Removing a small piece of nerve to be examined in  a lab (nerve biopsy). Removing and examining a small amount of the fluid that surrounds the brain and spinal cord (lumbar puncture). How is this treated? Treatment for this condition may involve: Treating the underlying cause of the neuropathy, such as diabetes, kidney disease, or vitamin deficiencies. Stopping medicines that can cause neuropathy, such as chemotherapy. Medicine to help relieve pain. Medicines may include: Prescription or over-the-counter pain medicine. Antiseizure medicine. Antidepressants. Pain-relieving patches that are applied to painful areas of skin. Surgery to relieve pressure on a nerve or to destroy a nerve that is causing pain. Physical therapy to help improve movement and balance. Devices to help you move around (assistive devices). Follow these instructions at home: Medicines Take over-the-counter and prescription medicines only as told by your health care provider. Do not take any other medicines without first asking your health care provider. Do not drive or use heavy machinery while taking prescription pain medicine. Lifestyle  Do not use any products that contain nicotine or tobacco, such as cigarettes and e-cigarettes. Smoking keeps blood from reaching damaged nerves. If you need help quitting, ask your health care provider. Avoid or limit alcohol. Too much alcohol can cause a vitamin B deficiency, and vitamin B is needed for healthy nerves. Eat a healthy diet. This includes: Eating foods that are high in fiber, such as fresh fruits and vegetables, whole grains, and beans. Limiting foods that are high in fat and processed sugars, such as fried or sweet foods.  General instructions  If you have diabetes, work closely with your health care provider to keep your blood sugar under control. If you have numbness in your feet: Check every day for signs of injury or infection. Watch for redness, warmth, and swelling. Wear padded socks and comfortable shoes.  These help protect your feet. Develop a good support system. Living with peripheral neuropathy can be stressful. Consider talking with a mental health specialist or joining a support group. Use assistive devices and attend physical therapy as told by your health care provider. This may include using a walker or a cane. Keep all follow-up visits as told by your health care provider. This is important.  Contact a health care provider if: You have new signs or symptoms of peripheral neuropathy. You are struggling emotionally from dealing with peripheral neuropathy. Your pain is not well-controlled. Get help right away if: You have an injury or infection that is not healing normally. You develop new weakness in an arm or leg. You have fallen or do so frequently. Summary Peripheral neuropathy is when the nerves in the arms, or legs are damaged, resulting in numbness, weakness, or pain. There are many causes of peripheral neuropathy, including diabetes, pinched nerves, vitamin deficiencies, autoimmune disease, and hereditary conditions. Diagnosing and finding the cause of peripheral neuropathy can be difficult. Your health care provider will take your medical history, do a physical exam, and do tests, including blood tests and nerve function tests. Treatment involves treating the underlying cause of the neuropathy and taking medicines to help control pain. Physical therapy and assistive devices may also help. This information is not intended to replace advice given to you by your health care provider. Make sure you discuss any questions you have with your healthcare provider. Document Revised: 10/18/2019 Document Reviewed: 10/18/2019 Elsevier Patient Education  2022 [employer]  

## 2020-08-07 NOTE — Progress Notes (Signed)
[facility]    Provider:  Dr  Referring Provider: McLean-Scocuzza, Tracy * Primary Care Physician:  McLean-Scocuzza, Tracy N, MD  CC:  sensory changes in feet  08/08/2020: She has very poor balance, she feels like she may keep going forward when stands, when she walks down the grocery isle she feels like she has to hold onto her husband. Pins and needles, cramping in the feet, she can be sitting, walking, in bed it is more hte cramps int he back of the legs and the arch of the foot, K+ pills helped with the cramps. Slowly progressive. Ongoing for over a year.   HgbA1c 6/22 7.4  04/05/20 8.3, 03/2020 B12 383, 03/2020 tsh 2.49  Interval history 11/10/2017: MR of the brain showed frontal chronic sinusitis, discussed seeing ENT. Propranolol is helping, the headaches are not as bad. She has been tested in the past recently 3 years ago and no sleep apnea.  She is under more stress now. Her pulse is 60 cannot increase propranolol further. Discussed options, due to stress may start Prozac which is a migraine preventative and can help with stress. Zofran for dizziness or nausea. Also start Emgality.   Until this past week has been doing "great" with the migraines but this one has on ongoinggoing for days will give you a torado Toradol l injection at that . 1Medications tried: propranolol, topiramate, prozac, nortriptyline, verapamil   Reviewed MRI of the brain images with patient and agree with th following:  This MRI of the brain with and without contrast shows the following: 1.    Couple punctate T2/FLAIR hyperintense foci in the subcortical white matter.  These are also seen on the 2012 MRI.  They are nonspecific but could represent very minimal chronic microvascular ischemic changes or sequela of migraine headaches. 2.    Chronic right frontal sinusitis. 3.    There are no acute findings and there is a normal enhancement pattern.  HPI:  Kristin [name] is a 59 y.o.  female here as requested by Dr. McLean-Scocuzza for headaches. Migraines in the past was always on the left hand side. She was on Topamax 10 years ago she stopped headaches improved after coming off of caffeine and a massage once a month. She would have occ headaches but not severe not frequent. She started noticing headaches in may and became more severe in September every time she stood up out of the car the headache would be severe, noticed a pattern of worsening headache when standing. No lumbar punctures, no trauma, no new medications, no recent illnesses. She hydrates well. No OTC medication overuse. Mid September the afternoon worsened but lost the positional quality. Now every afternoon she has a headache. Headache is on the top of the head, tightness, worse if she leans over from the throbbing. No light sensitivity, + sound sensitivity. Can wake up with the headaches. Tylenol helps a little bit. Sleeping helps. No other focal neurologic deficits, associated symptoms, inciting events or modifiable factors.  Reviewed notes, labs and imaging from outside physicians, which showed:  Reviewed MRI brain images from 2012 which were normal  Esr/crp nml  Review of Systems: Patient complains of symptoms per HPI as well as the following symptoms: foot pain . Pertinent negatives and positives per HPI. All others negative    Social History   Socioeconomic History   Marital status: Married    Spouse name: Not on file   Number of children: 3   Years of education: Not   on file   Highest education level: Some college, no degree  Occupational History   Not on file  Tobacco Use   Smoking status: Never   Smokeless tobacco: Never  Vaping Use   Vaping Use: Never used  Substance and Sexual Activity   Alcohol use: Never   Drug use: Never   Sexual activity: Yes    Partners: Male    Birth control/protection: Surgical  Other Topics Concern   Not on file  Social History Narrative   Married x 37 years  as of 10/2017    Kids 3 boys youngest is Eddie Dibbles   Write childrens books    Retired from [employer] 01/2017    Enjoys traveling    Right handed   Caffeine: quit 2009   Social Determinants of Health   Financial Resource Strain: Low Risk    Difficulty of Paying Living Expenses: Not hard at all  Food Insecurity: Not on file  Transportation Needs: Not on file  Physical Activity: Not on file  Stress: Not on file  Social Connections: Not on file  Intimate Partner Violence: Not on file    Family History  Problem Relation Age of Onset   Heart disease Mother    Breast cancer Mother 75       metastatic, now hip, spine, and leg    Arthritis Mother    Hyperlipidemia Mother    Cancer Mother        spinal surgery. breast dx'ed age 33 now metastatic 88 as of 07/13/18   Cystic fibrosis Mother        carrier    Heart disease Father    Lung cancer Father        smoker   Hyperlipidemia Father    Hypertension Father    Diabetes Sister    Stroke Sister    Other Sister        covid +   Diabetes Maternal Aunt    Diabetes Maternal Uncle    Diabetes Maternal Grandfather    Hyperlipidemia Maternal Grandfather    Arthritis Maternal Grandmother    Sudden death Cousin    Diabetes Maternal Aunt    Diabetes Sister    Cystic fibrosis Other        great niece    Colon cancer Neg Hx    Ovarian cancer Neg Hx     Past Medical History:  Diagnosis Date   Allergy    Arthritis    Basal cell carcinoma    nose, left cheek, right upper lip, right posterior shoulder most recent 6 years ago on left cheek   Bleeding    Chicken pox    Complication of anesthesia    blood pressure elevated after gastric sleeve surgery in recovery, pt does not want mask over face   COVID-19    03/01/20 had MAB 03/03/20 and felt better next day 03/04/20   Depression    Diabetes mellitus without complication ([place])    pre-diabetic currently, was diabetic prior to gastric sleeve surgery    Disorder of right rotator cuff     10/03/19 MRI right shoulder abnormal Dr. Maurine Cane   Fatty liver    Gout    Hematuria 10/15/2018   History of shingles    Hyperlipidemia    Hypertension    no problems since losing weight after gastric sleeve surgery   IBS (irritable bowel syndrome)    Insomnia    Migraine    Obesity    Osteopenia  Past Surgical History:  Procedure Laterality Date   ABDOMINAL HYSTERECTOMY     no h/o abnormal pap still has 1 ovary left   ANKLE ARTHROSCOPY Left 05/29/2016   Procedure: ANKLE ARTHROSCOPY;  Surgeon: Evans, Brent M, DPM;  Location: [facility] OR;  Service: Podiatry;  Laterality: Left;   APPENDECTOMY     basal cell removed     CESAREAN SECTION N/A    1985 & 1995   CHOLECYSTECTOMY     COLONOSCOPY WITH PROPOFOL N/A 01/31/2019   Procedure: COLONOSCOPY WITH PROPOFOL;  Surgeon: Vanga, Rohini Reddy, MD;  Location: [facility];  Service: Gastroenterology;  Laterality: N/A;   cyst removed from wrist     ESOPHAGOGASTRODUODENOSCOPY (EGD) WITH PROPOFOL N/A 01/31/2019   Procedure: ESOPHAGOGASTRODUODENOSCOPY (EGD) WITH PROPOFOL;  Surgeon: Vanga, Rohini Reddy, MD;  Location: [facility];  Service: Gastroenterology;  Laterality: N/A;   FLEXIBLE SIGMOIDOSCOPY N/A 05/21/2020   Procedure: FLEXIBLE SIGMOIDOSCOPY;  Surgeon: Vanga, Rohini Reddy, MD;  Location: [facility];  Service: Gastroenterology;  Laterality: N/A;   FOOT SURGERY     left foot 11/2016 Dr. Bret Evans    left elbow surgery     07/2017 Dr Harold Kernodle epicondylitis    MOHS SURGERY     x 2 face nose and left cheek    OSTEOCHONDROMA EXCISION Left 05/29/2016   Procedure: OSTEOCHONDRAL DRILLING TALUS;  Surgeon: Evans, Brent M, DPM;  Location: [facility] OR;  Service: Podiatry;  Laterality: Left;   PLANTAR FASCIA RELEASE Left 05/29/2016   Procedure: ENDOSCOPIC PLANTAR FASCIOTOMY;  Surgeon: Evans, Brent M, DPM;  Location: [facility] OR;  Service: Podiatry;  Laterality: Left;   ROTATOR CUFF REPAIR     ? right likely shoulder   STOMACH SURGERY  2017    gastric sleeve surgery   TENNIS ELBOW RELEASE/NIRSCHEL PROCEDURE Left 08/05/2017   Procedure: TENNIS ELBOW RELEASE/NIRSCHEL PROCEDURE;  Surgeon: Kernodle, Harold, MD;  Location: [facility];  Service: Orthopedics;  Laterality: Left;   TUBAL LIGATION     wedge resection of ovary  1982    Current Outpatient Medications  Medication Sig Dispense Refill   albuterol (VENTOLIN HFA) 108 (90 Base) MCG/ACT inhaler Inhale 1-2 puffs into the lungs every 6 (six) hours as needed for wheezing or shortness of breath. 18 g 11   amitriptyline (ELAVIL) 25 MG tablet TAKE 1 TABLET (25 MG TOTAL) BY MOUTH AT BEDTIME. INCREASE TO 50MG AFTER 2 WEEKS IF NEEDED 180 tablet 0   amLODipine (NORVASC) 5 MG tablet Take 1 tablet (5 mg total) by mouth daily. And stop 2.5 mg daily (2 pills) 90 tablet 3   Cholecalciferol (DIALYVITE VITAMIN D 5000 PO) Take 5,000 Units by mouth every other day.     Continuous Blood Gluc Sensor (FREESTYLE LIBRE 2 SENSOR) MISC Use to check glucose twice daily 2 each 11   dicyclomine (BENTYL) 20 MG tablet Take 20 mg by mouth 2 (two) times daily as needed for spasms.     empagliflozin (JARDIANCE) 10 MG TABS tablet Take 1 tablet (10 mg total) by mouth daily before breakfast. 30 tablet 2   fexofenadine (ALLEGRA) 180 MG tablet Take 1 tablet (180 mg total) by mouth daily as needed for allergies or rhinitis. 90 tablet 3   gabapentin (NEURONTIN) 300 MG capsule Take 1 capsule (300 mg total) by mouth 3 (three) times daily as needed. 90 capsule 11   montelukast (SINGULAIR) 10 MG tablet Take 1 tablet (10 mg total) by mouth at bedtime. 90 tablet 3   Multiple Vitamin (MULTIVITAMIN) tablet Take   1 tablet by mouth daily.     omeprazole (PRILOSEC) 40 MG capsule TAKE 1 CAPSULE (40 MG TOTAL) BY MOUTH DAILY BEFORE SUPPER. 90 capsule 0   ondansetron (ZOFRAN-ODT) 4 MG disintegrating tablet Take 1 tablet (4 mg total) by mouth every 8 (eight) hours as needed. for nausea 30 tablet 5   potassium chloride (MICRO-K) 10 MEQ CR capsule  Take 2 capsules (20 mEq total) by mouth daily. 180 capsule 3   Rimegepant Sulfate (NURTEC) 75 MG TBDP Take 75 mg by mouth daily as needed (take for abortive therapy of migraine, no more than 1 tablet in 24 hours or 10 per month). 10 tablet 11   rosuvastatin (CRESTOR) 20 MG tablet Take 1 tablet (20 mg total) by mouth every Monday, Wednesday, and Friday. 45 tablet 3   topiramate (TOPAMAX) 50 MG tablet Take 1 tablet (50 mg total) by mouth 2 (two) times daily. 90 tablet 3   triamcinolone (KENALOG) 0.1 % Apply 1 application topically 2 (two) times daily. Prn itching hives on body not face 454 g 0   triamterene-hydrochlorothiazide (MAXZIDE-25) 37.5-25 MG tablet Take 0.5 tablets by mouth daily. In the am 90 tablet 3   zolpidem (AMBIEN CR) 12.5 MG CR tablet TAKE 1 TABLET (12.5 MG TOTAL) BY MOUTH AT BEDTIME AS NEEDED. 30 tablet 5   No current facility-administered medications for this visit.    Allergies as of 08/07/2020 - Review Complete 08/07/2020  Allergen Reaction Noted   Tetracyclines & related Swelling and Rash 06/04/2014   Amoxicillin  12/10/2018   Alcohol Rash 12/07/2014   Azithromycin Rash 07/19/2018   Metoprolol Diarrhea 12/07/2014    Vitals: BP 137/84   Pulse 88   Ht 5' 2.5" (1.588 m)   Wt 162 lb (73.5 kg)   BMI 29.16 kg/m  Last Weight:  Wt Readings from Last 1 Encounters:  08/07/20 162 lb (73.5 kg)   Last Height:   Ht Readings from Last 1 Encounters:  08/07/20 5' 2.5" (1.588 m)   Exam: NAD, pleasant                  Speech:    Speech is normal; fluent and spontaneous with normal comprehension.  Cognition:    The patient is oriented to person, place, and time;     recent and remote memory intact;     language fluent;    Cranial Nerves:    The pupils are equal, round, and reactive to light.Trigeminal sensation is intact and the muscles of mastication are normal. The face is symmetric. The palate elevates in the midline. Hearing intact. Voice is normal. Shoulder shrug is  normal. The tongue has normal motion without fasciculations.   Coordination:  No dysmetria  Motor Observation:    No asymmetry, no atrophy, and no involuntary movements noted. Tone:    Normal muscle tone.     Strength:    Strength is V/V in the upper and lower limbs.      Sensation and reflexes: Trace to absent AJs, Dec pp and  temp to mid calfs, Intact vib/prop    Assessment/Plan:  59 y.o. patiemnt here as a request from Dr. Mclean-Scocuzza for peripheral neuropathy. She has a past medical history of diabetes and B12 deficiency.  She reports slowly progressive numbness, burning, tingling started in the toes 2-3 years ago. Had an extended visit today discussing causesof neuropathy, likely diabetes with a contribution from B12 deficiency. Discussed the causes of peripheral neuropathy, the most common being diabetes which patient   does have. About 20 million people in the [place] have some form of peripheral neuropathy. This is a condition that develops as a result of damage to the peripheral nervous system. Given her symptoms which are distal predominant, symmetrical, slowly progressive, and an ascending pattern with decreased sensation in small fibers in a gradient fashion, suspect a symmetric length dependent neuropathy probably axonal. EMG/NCS unlikely to provide additional information.  -May consider daily alpha lipoic acid which is an antioxidant that may reduce free radical oxidative stress associated with diabetic polyneuropathy, existing evidence suggests that alpha lipoic acid significantly reduces stabbing, lancinating and burning pain and diabetic neuropathy with its onset of action as early as 1-2 weeks. 600mg 1-2 x a day - Gabapentin as needed for pain - Topical OTC lidocaine cream and Capsaicin (I can also make you cream with gabapentin and amitriptyline and other ingredients) - Qutenza - new, consider - Try for cramping; Tonic Water with quinine or try yellow mustard  packets  Migraines: doing well. Has a headache today will give her a toradol injections.    , MD  [facility] [address] [place],  [address]  I spent over 60  minutes of face-to-face and non-face-to-face time with patient on the  1. Diabetic polyneuropathy associated with diabetes mellitus due to underlying condition (HCC)   2. Chronic migraine w/o aura, not intractable, w/o stat migr    diagnosis.  This included previsit chart review, lab review, study review, order entry, electronic health record documentation, patient education on the different diagnostic and therapeutic options, counseling and coordination of care, risks and benefits of management, compliance, or risk factor reduction  

## 2020-08-07 NOTE — Progress Notes (Signed)
Pt here for appt. Order for Toradol 60mg  IM injection per Dr. Jaynee Eagles. Under aseptic technique Toradol 60mg  /41ml IM given  R upper outer quadrant.  Tolerated well.  Bandaid applied.

## 2020-08-08 DIAGNOSIS — G43709 Chronic migraine without aura, not intractable, without status migrainosus: Secondary | ICD-10-CM | POA: Insufficient documentation

## 2020-08-08 DIAGNOSIS — E0842 Diabetes mellitus due to underlying condition with diabetic polyneuropathy: Secondary | ICD-10-CM

## 2020-08-08 HISTORY — DX: Chronic migraine without aura, not intractable, without status migrainosus: G43.709

## 2020-08-08 HISTORY — DX: Diabetes mellitus due to underlying condition with diabetic polyneuropathy: E08.42

## 2020-08-17 ENCOUNTER — Ambulatory Visit: Payer: BC Managed Care – PPO | Admitting: Pharmacist

## 2020-08-17 DIAGNOSIS — G629 Polyneuropathy, unspecified: Secondary | ICD-10-CM

## 2020-08-17 DIAGNOSIS — E1165 Type 2 diabetes mellitus with hyperglycemia: Secondary | ICD-10-CM

## 2020-08-17 DIAGNOSIS — E785 Hyperlipidemia, unspecified: Secondary | ICD-10-CM

## 2020-08-17 DIAGNOSIS — I152 Hypertension secondary to endocrine disorders: Secondary | ICD-10-CM

## 2020-08-17 MED ORDER — ROSUVASTATIN CALCIUM 20 MG PO TABS: 20.0000 mg | ORAL_TABLET | Freq: Every day | ORAL | 3 refills | Status: DC

## 2020-08-17 NOTE — Chronic Care Management (AMB) (Signed)
Care Management   Pharmacy Note  08/17/2020 Name: Kristin [name] MRN: [id number] DOB: [birth year]  Subjective: Kristin [name] is a 60 y.o. year old female who is a primary care patient of McLean-Scocuzza, Nino Glow, MD. The Care Management team was consulted for assistance with care management and care coordination needs.    Engaged with patient by telephone for follow up visit in response to provider referral for pharmacy case management and/or care coordination services.   The patient was given information about Care Management services today including:  Care Management services includes personalized support from designated clinical staff supervised by the patient's primary care provider, including individualized plan of care and coordination with other care providers. 24/7 contact phone numbers for assistance for urgent and routine care needs. The patient may stop case management services at any time by phone call to the office staff.  Patient agreed to services and consent obtained.  Assessment:  Review of patient status, including review of consultants reports, laboratory and other test data, was performed as part of comprehensive evaluation and provision of chronic care management services.   SDOH (Social Determinants of Health) assessments and interventions performed:    Objective:  Lab Results  Component Value Date   CREATININE 1.02 07/18/2020   CREATININE 0.97 05/03/2020   CREATININE 1.04 04/23/2020    Lab Results  Component Value Date   HGBA1C 7.4 (H) 07/18/2020       Component Value Date/Time   CHOL 201 (H) 07/18/2020 1225   CHOL 147 05/05/2019 0838   TRIG 149.0 07/18/2020 1225   HDL 63.50 07/18/2020 1225   HDL 58 05/05/2019 0838   CHOLHDL 3 07/18/2020 1225   VLDL 29.8 07/18/2020 1225   LDLCALC 108 (H) 07/18/2020 1225   LDLCALC 62 05/05/2019 0838   Clinical ASCVD: No  The 10-year ASCVD risk score Mikey Bussing DC Jr., et al., 2013) is: 8.7%   Values used  to calculate the score:     Age: 89 years     Sex: Female     Is Non-Hispanic African American: No     Diabetic: Yes     Tobacco smoker: No     Systolic Blood Pressure: 0000000 mmHg     Is BP treated: Yes     HDL Cholesterol: 63.5 mg/dL     Total Cholesterol: 201 mg/dL     BP Readings from Last 3 Encounters:  08/07/20 137/84  07/18/20 110/84  06/05/20 117/83    Care Plan  Allergies  Allergen Reactions   Tetracyclines & Related Swelling and Rash    FACE SWELL, RASH ON CHEST   Amoxicillin    Alcohol Rash    ABDOMINAL CRAMPS, drinking alcohol   Azithromycin Rash   Metoprolol Diarrhea    Medications Reviewed Today     Reviewed by De Hollingshead, RPH-CPP (Pharmacist) on 08/17/20 at 29  Med List Status: <None>   Medication Order Taking? Sig Documenting Provider Last Dose Status Informant  albuterol (VENTOLIN HFA) 108 (90 Base) MCG/ACT inhaler [id number]  Inhale 1-2 puffs into the lungs every 6 (six) hours as needed for wheezing or shortness of breath. McLean-Scocuzza, Nino Glow, MD  Active   amitriptyline (ELAVIL) 25 MG tablet [id number] Yes TAKE 1 TABLET (25 MG TOTAL) BY MOUTH AT BEDTIME. INCREASE TO '50MG'$  AFTER 2 WEEKS IF NEEDED Vanga, Tally Due, MD Taking Active   amLODipine (NORVASC) 5 MG tablet [id number] Yes Take 1 tablet (5 mg total) by mouth daily. And stop 2.5 mg daily (2  pills) McLean-Scocuzza, Nino Glow, MD Taking Active   Cholecalciferol (DIALYVITE VITAMIN D 5000 PO) [id number] Yes Take 5,000 Units by mouth every other day. [provider] Taking Active   Continuous Blood Gluc Sensor (FREESTYLE LIBRE 2 SENSOR) [place] [id number] Yes Use to check glucose twice daily McLean-Scocuzza, Nino Glow, MD Taking Active   dicyclomine (BENTYL) 20 MG tablet [id number] No Take 20 mg by mouth 2 (two) times daily as needed for spasms.  Patient not taking: Reported on 08/17/2020   [provider] Not Taking Active   empagliflozin (JARDIANCE) 10 MG TABS tablet [id number] Yes  Take 1 tablet (10 mg total) by mouth daily before breakfast. McLean-Scocuzza, Nino Glow, MD Taking Active   fexofenadine (ALLEGRA) 180 MG tablet [id number] Yes Take 1 tablet (180 mg total) by mouth daily as needed for allergies or rhinitis. McLean-Scocuzza, Nino Glow, MD Taking Active   gabapentin (NEURONTIN) 300 MG capsule [id number] Yes Take 1 capsule (300 mg total) by mouth 3 (three) times daily as needed. Melvenia Beam, MD Taking Active            Med Note Darnelle Maffucci, IMALA ADAMCIK   Fri Aug 17, 2020  1:21 PM) QPM PRN  montelukast (SINGULAIR) 10 MG tablet [id number] Yes Take 1 tablet (10 mg total) by mouth at bedtime. McLean-Scocuzza, Nino Glow, MD Taking Active   Multiple Vitamin (MULTIVITAMIN) tablet [id number] Yes Take 1 tablet by mouth daily. [provider] Taking Active   omeprazole (PRILOSEC) 40 MG capsule [id number] Yes TAKE 1 CAPSULE (40 MG TOTAL) BY MOUTH DAILY BEFORE SUPPER. Lin Landsman, MD Taking Active   ondansetron (ZOFRAN-ODT) 4 MG disintegrating tablet [id number] No Take 1 tablet (4 mg total) by mouth every 8 (eight) hours as needed. for nausea  Patient not taking: Reported on 08/17/2020   McLean-Scocuzza, Nino Glow, MD Not Taking Active   potassium chloride (MICRO-K) 10 MEQ CR capsule [id number] Yes Take 2 capsules (20 mEq total) by mouth daily. McLean-Scocuzza, Nino Glow, MD Taking Active   Rimegepant Sulfate (NURTEC) 75 MG TBDP [id number] No Take 75 mg by mouth daily as needed (take for abortive therapy of migraine, no more than 1 tablet in 24 hours or 10 per month).  Patient not taking: Reported on 08/17/2020   Debbora Presto, NP Not Taking Active   rosuvastatin (CRESTOR) 20 MG tablet [id number] Yes Take 1 tablet (20 mg total) by mouth every Monday, Wednesday, and Friday. McLean-Scocuzza, Nino Glow, MD Taking Active   topiramate (TOPAMAX) 50 MG tablet [id number] Yes Take 1 tablet (50 mg total) by mouth 2 (two) times daily. Debbora Presto, NP Taking Active            Med Note THENA, FLEISHMAN May 28, 2020  1:06 PM) Taking once daily QAM  triamcinolone (KENALOG) 0.1 % [id number] Yes Apply 1 application topically 2 (two) times daily. Prn itching hives on body not face McLean-Scocuzza, Nino Glow, MD Taking Active   triamterene-hydrochlorothiazide (MAXZIDE-25) 37.5-25 MG tablet [id number] Yes Take 0.5 tablets by mouth daily. In the am McLean-Scocuzza, Nino Glow, MD Taking Active   zolpidem (AMBIEN CR) 12.5 MG CR tablet [id number] Yes TAKE 1 TABLET (12.5 MG TOTAL) BY MOUTH AT BEDTIME AS NEEDED. McLean-Scocuzza, Nino Glow, MD Taking Active             Patient Active Problem List   Diagnosis Date Noted   Diabetic polyneuropathy associated with diabetes mellitus due to underlying condition ([place]) 08/08/2020   Chronic migraine w/o aura,  not intractable, w/o stat migr 08/08/2020   Adhesive capsulitis of right shoulder 07/23/2020   Rectal bleeding 05/03/2020   CTS (carpal tunnel syndrome) 04/05/2020   Stress 03/28/2020   Hives 03/28/2020   Annual physical exam 08/19/2019   Hypertension associated with diabetes ([place]) 08/18/2019   Obesity, diabetes, and hypertension syndrome ([place]) 08/18/2019   Irritable bowel syndrome 03/01/2019   Chronic diarrhea    DM2 (diabetes mellitus, type 2) ([place]) 08/31/2018   Vitamin D deficiency 08/31/2018   Chronic migraine without aura, with intractable migraine, so stated, with status migrainosus 11/10/2017   Chronic intractable headache 10/20/2017   Obesity (BMI 30-39.9) 05/06/2017   Fatty liver 05/06/2017   Injury of right wrist 06/26/2016   Hyperlipidemia 12/12/2015   Anxiety 12/12/2015   Obstructive sleep apnea syndrome 10/10/2014   Gastroesophageal reflux disease with hiatal hernia 09/29/2014   Morbid obesity ([place]) 09/29/2014   Benign essential hypertension 08/14/2014   Palpitations 08/14/2014   Hypertension 07/17/2014   Insomnia 07/17/2014    Conditions to be addressed/monitored: HTN, HLD, and DMII  Care Plan : PharmD - Medication  Management  Updates made by De Hollingshead, RPH-CPP since 08/17/2020 12:00 AM     Problem: Diabetes, IBS-D, HLD, Anxiety      Long-Range Goal: Disease Progression Prevention   Start Date: 05/28/2020  This Visit's Progress: On track  Recent Progress: On track  Priority: High  Note:   Current Barriers:  Unable to achieve control of diabetes   Pharmacist Clinical Goal(s):  Over the next 90 days, patient will achieve control of diabetes as evidenced by A1c through collaboration with PharmD and provider.   Interventions: 1:1 collaboration with McLean-Scocuzza, Nino Glow, MD regarding development and update of comprehensive plan of care as evidenced by provider attestation and co-signature Inter-disciplinary care team collaboration (see longitudinal plan of care) Comprehensive medication review performed; medication list updated in electronic medical record  SDOH: Mother of 3 sons. Splits time between here and [place] caring for her ailing mother. Sister in [place] is a Marine scientist.  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   Diabetes: Uncontrolled but improved; current treatment: Jardiance 10 mg  Has had 2 yeast infections since starting Jardiance, but admits they were related to poor hydration or sitting in a wet swimsuit.  Current glucose readings: Using Libre 2 CGM Date of Download: 7/16-7/29/22 % Time CGM is active: 65% Average Glucose: 137 mg/dL Glucose Management Indicator: 6.6%  Glucose Variability: 20.2 (goal <36%) Time in Goal:  - Time in range 70-180: 92% - Time above range: 8% - Time below range: 0% Observed patterns: infrequent post prandial elevations Extensive discussion about SGLT2 and risk of GU infections. Patient would like to focus on hydration and good genital hygiene rather than switch medications at this time. Discussed GLP1 for weight management. Patient is hesitant to use an injectable medication. Discussed Rybelsus, but she worries that the administration requirements  would prevent her from remembering to take her other morning medications. Elected to continue current regimen at this time.   Hypertension w/ hx hypokalemia: Controlled; current treatment: triamterene/HCTZ 37.5/25 mg 1/2 tab daily, amlodipine 5 mg daily QAM; potassium 20 mEq daily . Reports leg cramps are significantly improved with potassium supplementation Current home readings: did not discuss today Will discuss home BP readings at future visits. Recommend to continue current regimen at this time  Hyperlipidemia: Uncontrolled; current treatment: rosuvastatin 20 mg every other day Discussed benefit of increasing rosuvastatin to daily administration, as leg cramps were most likely related  to hypokalemia. Patient amenable. Increase rosuvastatin to 20 mg daily.   Asthma/Allergies: Controlled right now; current treatment: montelukast 10 mg daily; fexofenadine 180 mg daily, hydroxyzine 25 mg PRN hives, triamcinolone 0.1% cream PRN hives, related to changing environments, stress from caring for her mother;  Previously recommended to continue current regimen at this time.   Insomnia/Anxiety/Neuropathy Controlled, but with high risk medications as patient ages; current regimen: zolpidem 12.5 mg QPM, amitriptyline 25 mg QPM; started on gabapentin 300 mg PRN by neurology  Hx fluoxetine; hx diazepam, temazepam, trazodone, nortriptyline.  Educated on sedative action of gabapentin. Patient does not take it if she will be driving.  Recommended to continue current regimen at this time.  Migraines: Improved control; current regimen: topiramate 50 mg QAM; concurrent amitriptyline, Nurtec 75 mg PRN; onransetron 4 mg PRN nausea Hx Emgality - did not like giving injections Reduced topiramate to once daily due to mental fog.  Previously recommended to continue current regimen at this time along with neurology collaboration  IBS-D/Hemorrhoids: Improved; current regimen: dicyclomine 20 mg BID PRN-;  hydrocortisone 2.5% rectal - though has not needed since last ligation; omeprazole 40 mg daily, amitriptyline for anticholinergic benefit; follows w/ Dr. Marius Ditch;  Previously recommended to continue current regimen as above  Patient Goals/Self-Care Activities Over the next 90 days, patient will:  - take medications as prescribed check glucose 2-3 times daily using CGM, document, and provide at future appointments engage in dietary modifications by reducing carbohydrate portion sizes  Follow Up Plan: Telephone follow up appointment with care management team member scheduled for: ~12 weeks     Medication Assistance:  None required.  Patient affirms current coverage meets needs.  Follow Up:  Patient agrees to Care Plan and Follow-up.  Plan: Telephone follow up appointment with care management team member scheduled for:  ~ 12 weeks  Catie Darnelle Maffucci, PharmD, [place], [place] Clinical Pharmacist [employer] [facility] [employer] [phone]

## 2020-08-17 NOTE — Patient Instructions (Signed)
Visit Information   Goals Addressed               This Visit's Progress     Patient Stated     Medication Monitoring (pt-stated)        Patient Goals/Self-Care Activities Over the next 90 days, patient will:  - take medications as prescribed check glucose 2-3 times daily using CGM, document, and provide at future appointments engage in dietary modifications by reducing carbohydrate portion sizes        Patient verbalizes understanding of instructions provided today and agrees to view in [place].   Plan: Telephone follow up appointment with care management team member scheduled for:  ~ 12 weeks  Catie Darnelle Maffucci, PharmD, [place], [place] Clinical Pharmacist [employer] [facility] [employer] [phone]

## 2020-08-18 ENCOUNTER — Other Ambulatory Visit: Payer: Self-pay | Admitting: Internal Medicine

## 2020-08-18 DIAGNOSIS — E1165 Type 2 diabetes mellitus with hyperglycemia: Secondary | ICD-10-CM

## 2020-08-23 ENCOUNTER — Encounter: Payer: Self-pay | Admitting: Internal Medicine

## 2020-08-23 DIAGNOSIS — R110 Nausea: Secondary | ICD-10-CM

## 2020-08-23 DIAGNOSIS — I152 Hypertension secondary to endocrine disorders: Secondary | ICD-10-CM

## 2020-08-23 DIAGNOSIS — E1159 Type 2 diabetes mellitus with other circulatory complications: Secondary | ICD-10-CM

## 2020-08-27 ENCOUNTER — Ambulatory Visit: Payer: BC Managed Care – PPO | Admitting: Pharmacist

## 2020-08-27 DIAGNOSIS — E1165 Type 2 diabetes mellitus with hyperglycemia: Secondary | ICD-10-CM

## 2020-08-27 DIAGNOSIS — E785 Hyperlipidemia, unspecified: Secondary | ICD-10-CM

## 2020-08-27 DIAGNOSIS — E669 Obesity, unspecified: Secondary | ICD-10-CM

## 2020-08-27 DIAGNOSIS — I10 Essential (primary) hypertension: Secondary | ICD-10-CM

## 2020-08-27 MED ORDER — TIRZEPATIDE 2.5 MG/0.5ML ~~LOC~~ SOAJ: 2.5000 mg | SUBCUTANEOUS | 0 refills | Status: DC

## 2020-08-27 MED ORDER — ONDANSETRON 4 MG PO TBDP: 4.0000 mg | ORAL_TABLET | Freq: Three times a day (TID) | ORAL | 5 refills | Status: DC | PRN

## 2020-08-27 MED ORDER — TIRZEPATIDE 5 MG/0.5ML ~~LOC~~ SOAJ: 5.0000 mg | SUBCUTANEOUS | 2 refills | Status: DC

## 2020-08-27 NOTE — Chronic Care Management (AMB) (Addendum)
Care Management   Pharmacy Note  08/27/2020 Name: Kristin [name] MRN: [id number] DOB: 12-May-1960  Subjective: Kristin [name] is a 60 y.o. year old female who is a primary care patient of McLean-Scocuzza, Nino Glow, MD. The Care Management team was consulted for assistance with care management and care coordination needs.    Engaged with patient by telephone for  medication management question  in response to provider referral for pharmacy case management and/or care coordination services.   The patient was given information about Care Management services today including:  Care Management services includes personalized support from designated clinical staff supervised by the patient's primary care provider, including individualized plan of care and coordination with other care providers. 24/7 contact phone numbers for assistance for urgent and routine care needs. The patient may stop case management services at any time by phone call to the office staff.  Patient agreed to services and consent obtained.  Assessment:  Review of patient status, including review of consultants reports, laboratory and other test data, was performed as part of comprehensive evaluation and provision of chronic care management services.   SDOH (Social Determinants of Health) assessments and interventions performed:  none  Objective:  Lab Results  Component Value Date   CREATININE 1.02 07/18/2020   CREATININE 0.97 05/03/2020   CREATININE 1.04 04/23/2020    Lab Results  Component Value Date   HGBA1C 7.4 (H) 07/18/2020       Component Value Date/Time   CHOL 201 (H) 07/18/2020 1225   CHOL 147 05/05/2019 0838   TRIG 149.0 07/18/2020 1225   HDL 63.50 07/18/2020 1225   HDL 58 05/05/2019 0838   CHOLHDL 3 07/18/2020 1225   VLDL 29.8 07/18/2020 1225   LDLCALC 108 (H) 07/18/2020 1225   LDLCALC 62 05/05/2019 0838     Clinical ASCVD: No  The 10-year ASCVD risk score Mikey Bussing DC Jr., et al., 2013)  is: 8.7%   Values used to calculate the score:     Age: 9 years     Sex: Female     Is Non-Hispanic African American: No     Diabetic: Yes     Tobacco smoker: No     Systolic Blood Pressure: 0000000 mmHg     Is BP treated: Yes     HDL Cholesterol: 63.5 mg/dL     Total Cholesterol: 201 mg/dL      BP Readings from Last 3 Encounters:  08/07/20 137/84  07/18/20 110/84  06/05/20 117/83    Care Plan  Allergies  Allergen Reactions   Tetracyclines & Related Swelling and Rash    FACE SWELL, RASH ON CHEST   Amoxicillin    Alcohol Rash    ABDOMINAL CRAMPS, drinking alcohol   Azithromycin Rash   Metoprolol Diarrhea    Medications Reviewed Today     Reviewed by De Hollingshead, RPH-CPP (Pharmacist) on 08/17/20 at 1324  Med List Status: <None>   Medication Order Taking? Sig Documenting Provider Last Dose Status Informant  albuterol (VENTOLIN HFA) 108 (90 Base) MCG/ACT inhaler [id number]  Inhale 1-2 puffs into the lungs every 6 (six) hours as needed for wheezing or shortness of breath. McLean-Scocuzza, Nino Glow, MD  Active   amitriptyline (ELAVIL) 25 MG tablet [id number] Yes TAKE 1 TABLET (25 MG TOTAL) BY MOUTH AT BEDTIME. INCREASE TO '50MG'$  AFTER 2 WEEKS IF NEEDED Vanga, Tally Due, MD Taking Active   amLODipine (NORVASC) 5 MG tablet [id number] Yes Take 1 tablet (5 mg total) by mouth daily. And  stop 2.5 mg daily (2 pills) McLean-Scocuzza, Nino Glow, MD Taking Active   Cholecalciferol (DIALYVITE VITAMIN D 5000 PO) [id number] Yes Take 5,000 Units by mouth every other day. [provider] Taking Active   Continuous Blood Gluc Sensor (FREESTYLE LIBRE 2 SENSOR) [place] [id number] Yes Use to check glucose twice daily McLean-Scocuzza, Nino Glow, MD Taking Active   dicyclomine (BENTYL) 20 MG tablet [id number] No Take 20 mg by mouth 2 (two) times daily as needed for spasms.  Patient not taking: Reported on 08/17/2020   [provider] Not Taking Active   empagliflozin (JARDIANCE) 10 MG  TABS tablet [id number] Yes Take 1 tablet (10 mg total) by mouth daily before breakfast. McLean-Scocuzza, Nino Glow, MD Taking Active   fexofenadine (ALLEGRA) 180 MG tablet [id number] Yes Take 1 tablet (180 mg total) by mouth daily as needed for allergies or rhinitis. McLean-Scocuzza, Nino Glow, MD Taking Active   gabapentin (NEURONTIN) 300 MG capsule [id number] Yes Take 1 capsule (300 mg total) by mouth 3 (three) times daily as needed. Melvenia Beam, MD Taking Active            Med Note Darnelle Maffucci, ARIJAH FOYLE   Fri Aug 17, 2020  1:21 PM) QPM PRN  montelukast (SINGULAIR) 10 MG tablet [id number] Yes Take 1 tablet (10 mg total) by mouth at bedtime. McLean-Scocuzza, Nino Glow, MD Taking Active   Multiple Vitamin (MULTIVITAMIN) tablet [id number] Yes Take 1 tablet by mouth daily. [provider] Taking Active   omeprazole (PRILOSEC) 40 MG capsule [id number] Yes TAKE 1 CAPSULE (40 MG TOTAL) BY MOUTH DAILY BEFORE SUPPER. Lin Landsman, MD Taking Active   ondansetron (ZOFRAN-ODT) 4 MG disintegrating tablet [id number] No Take 1 tablet (4 mg total) by mouth every 8 (eight) hours as needed. for nausea  Patient not taking: Reported on 08/17/2020   McLean-Scocuzza, Nino Glow, MD Not Taking Active   potassium chloride (MICRO-K) 10 MEQ CR capsule [id number] Yes Take 2 capsules (20 mEq total) by mouth daily. McLean-Scocuzza, Nino Glow, MD Taking Active   Rimegepant Sulfate (NURTEC) 75 MG TBDP [id number] No Take 75 mg by mouth daily as needed (take for abortive therapy of migraine, no more than 1 tablet in 24 hours or 10 per month).  Patient not taking: Reported on 08/17/2020   Debbora Presto, NP Not Taking Active   rosuvastatin (CRESTOR) 20 MG tablet [id number] Yes Take 1 tablet (20 mg total) by mouth every Monday, Wednesday, and Friday. McLean-Scocuzza, Nino Glow, MD Taking Active   topiramate (TOPAMAX) 50 MG tablet [id number] Yes Take 1 tablet (50 mg total) by mouth 2 (two) times daily. Debbora Presto, NP Taking Active             Med Note JOSALYNN, MEDSKER May 28, 2020  1:06 PM) Taking once daily QAM  triamcinolone (KENALOG) 0.1 % [id number] Yes Apply 1 application topically 2 (two) times daily. Prn itching hives on body not face McLean-Scocuzza, Nino Glow, MD Taking Active   triamterene-hydrochlorothiazide (MAXZIDE-25) 37.5-25 MG tablet [id number] Yes Take 0.5 tablets by mouth daily. In the am McLean-Scocuzza, Nino Glow, MD Taking Active   zolpidem (AMBIEN CR) 12.5 MG CR tablet [id number] Yes TAKE 1 TABLET (12.5 MG TOTAL) BY MOUTH AT BEDTIME AS NEEDED. McLean-Scocuzza, Nino Glow, MD Taking Active             Patient Active Problem List   Diagnosis Date Noted   Diabetic polyneuropathy associated with diabetes mellitus due to underlying condition ([place]) 08/08/2020  Chronic migraine w/o aura, not intractable, w/o stat migr 08/08/2020   Adhesive capsulitis of right shoulder 07/23/2020   Rectal bleeding 05/03/2020   CTS (carpal tunnel syndrome) 04/05/2020   Stress 03/28/2020   Hives 03/28/2020   Annual physical exam 08/19/2019   Hypertension associated with diabetes ([place]) 08/18/2019   Obesity, diabetes, and hypertension syndrome ([place]) 08/18/2019   Irritable bowel syndrome 03/01/2019   Chronic diarrhea    DM2 (diabetes mellitus, type 2) ([place]) 08/31/2018   Vitamin D deficiency 08/31/2018   Chronic migraine without aura, with intractable migraine, so stated, with status migrainosus 11/10/2017   Chronic intractable headache 10/20/2017   Obesity (BMI 30-39.9) 05/06/2017   Fatty liver 05/06/2017   Injury of right wrist 06/26/2016   Hyperlipidemia 12/12/2015   Anxiety 12/12/2015   Obstructive sleep apnea syndrome 10/10/2014   Gastroesophageal reflux disease with hiatal hernia 09/29/2014   Morbid obesity ([place]) 09/29/2014   Benign essential hypertension 08/14/2014   Palpitations 08/14/2014   Hypertension 07/17/2014   Insomnia 07/17/2014    Conditions to be addressed/monitored: HTN, HLD, and DMII  Care Plan :  PharmD - Medication Management  Updates made by De Hollingshead, RPH-CPP since 08/27/2020 12:00 AM     Problem: Diabetes, IBS-D, HLD, Anxiety      Long-Range Goal: Disease Progression Prevention   Start Date: 05/28/2020  Recent Progress: On track  Priority: High  Note:   Current Barriers:  Unable to achieve control of diabetes   Pharmacist Clinical Goal(s):  Over the next 90 days, patient will achieve control of diabetes as evidenced by A1c through collaboration with PharmD and provider.   Interventions: 1:1 collaboration with McLean-Scocuzza, Nino Glow, MD regarding development and update of comprehensive plan of care as evidenced by provider attestation and co-signature Inter-disciplinary care team collaboration (see longitudinal plan of care) Comprehensive medication review performed; medication list updated in electronic medical record  SDOH: Mother of 3 sons. Splits time between here and [place] caring for her ailing mother. Sister in [place] is a Marine scientist.  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   Diabetes: Uncontrolled but improved; current treatment: Jardiance 10 mg  Sends MyChart message today regarding our previous conversation and noting that she would like to try Ozempic.  Counseled on GLP1 agonists, including mechanism of action, side effects, and benefits. No personal or family history of medullary thyroid cancer, personal history of pancreatitis or gallbladder disease. Counseled on potential side effects of nausea, stomach upset, queasiness, constipation, and that these generally improve over time. Advised to contact our office with more severe symptoms, including nausea, diarrhea, stomach pain. Patient verbalized understanding. Discussed current shortage issues with Ozempic. Discussed use of Mounjaro instead. Patient amenable. Start Mounjaro 2.5 mg once weekly, providing with samples for first month. Script for 5 mg dose sent to her pharmacy. PA submitted. Will follow for result,  though $25 copay card will cover. Patient will present later today for injection teaching  Hypertension w/ hx hypokalemia: Controlled; current treatment: triamterene/HCTZ 37.5/25 mg 1/2 tab daily, amlodipine 5 mg daily QAM; potassium 20 mEq daily . Reports leg cramps are significantly improved with potassium supplementation Will discuss home BP readings at future visits. Previously recommended to continue current regimen at this time  Hyperlipidemia: Uncontrolled; current treatment: rosuvastatin 20 mg daily Continue current regimen at this time.  Asthma/Allergies: Controlled right now; current treatment: montelukast 10 mg daily; fexofenadine 180 mg daily, hydroxyzine 25 mg PRN hives, triamcinolone 0.1% cream PRN hives, related to changing environments, stress from caring for  her mother;  Previously recommended to continue current regimen at this time.   Insomnia/Anxiety/Neuropathy Controlled, but with high risk medications as patient ages; current regimen: zolpidem 12.5 mg QPM, amitriptyline 25 mg QPM; started on gabapentin 300 mg PRN by neurology  Hx fluoxetine; hx diazepam, temazepam, trazodone, nortriptyline.  Previously recommended to continue current regimen at this time.  Migraines: Improved control; current regimen: topiramate 50 mg QAM; concurrent amitriptyline, Nurtec 75 mg PRN; onransetron 4 mg PRN nausea Hx Emgality - did not like giving injections Reduced topiramate to once daily due to mental fog.  Previously recommended to continue current regimen at this time along with neurology collaboration  IBS-D/Hemorrhoids: Improved; current regimen: dicyclomine 20 mg BID PRN-; hydrocortisone 2.5% rectal - though has not needed since last ligation; omeprazole 40 mg daily, amitriptyline for anticholinergic benefit; follows w/ Dr. Marius Ditch;  Previously recommended to continue current regimen as above  Patient Goals/Self-Care Activities Over the next 90 days, patient will:  - take  medications as prescribed check glucose 2-3 times daily using CGM, document, and provide at future appointments engage in dietary modifications by reducing carbohydrate portion sizes  Follow Up Plan: Telephone follow up appointment with care management team member scheduled for: 8 weeks     Medication Assistance:  None required.  Patient affirms current coverage meets needs.  Follow Up:  Patient agrees to Care Plan and Follow-up.  Plan: Telephone follow up appointment with care management team member scheduled for:  ~ 8 weeks  Catie Darnelle Maffucci, PharmD, [place], CPP Clinical Pharmacist [place] [facility] [phone]  Medication Samples have been provided to the patient.  Drug name: Darcel Bayley       Strength: 2.5 mg        Qty: 1 box  LOT: [id number]  Exp.Date: 10/23/2021

## 2020-08-27 NOTE — Patient Instructions (Signed)
Cathy,   Start Mounarjo 2.5 mg weekly. After 4 weeks, increase to 5 mg weekly (this prescription was sent to the pharmacy). This medication may cause stomach upset, queasiness, or constipation, especially when first starting. This generally improves over time. Call our office if these symptoms occur and worsen, or if you have severe symptoms such as vomiting, diarrhea, or stomach pain.   Keep me in the loop if you have any issues!  Catie Darnelle Maffucci, PharmD [phone]     Visit Information   Goals Addressed               This Visit's Progress     Patient Stated     Medication Monitoring (pt-stated)        Patient Goals/Self-Care Activities Over the next 90 days, patient will:  - take medications as prescribed check glucose 2-3 times daily using CGM, document, and provide at future appointments engage in dietary modifications by reducing carbohydrate portion sizes         Patient verbalizes understanding of instructions provided today and agrees to view in [place].    Plan: Telephone follow up appointment with care management team member scheduled for:  ~ 8 weeks  Catie Darnelle Maffucci, PharmD, [place], [place] Clinical Pharmacist [employer] at [employer] [phone]

## 2020-08-27 NOTE — Progress Notes (Signed)
Encounter details: CCM Time Spent       Value Time User   Total time (minutes)  0 08/27/2020 10:12 AM De Hollingshead, RPH-CPP      Moderate to High Complex Decision Making     None      CCM Services: n/a  Prior to outreach and patient consent for Chronic Care Management, I referred this patient for services after reviewing the nominated patient list or from a personal encounter with the patient.  I have personally reviewed this encounter including the documentation in this note and have collaborated with the care management provider regarding care management and care coordination activities to include development and update of the comprehensive care plan. I am certifying that I agree with the content of this note and encounter as supervising physician.   Dr. Olivia Mackie McLean-Scocuzza

## 2020-09-04 DIAGNOSIS — M9902 Segmental and somatic dysfunction of thoracic region: Secondary | ICD-10-CM | POA: Diagnosis not present

## 2020-09-04 DIAGNOSIS — M9901 Segmental and somatic dysfunction of cervical region: Secondary | ICD-10-CM | POA: Diagnosis not present

## 2020-09-04 DIAGNOSIS — M546 Pain in thoracic spine: Secondary | ICD-10-CM | POA: Diagnosis not present

## 2020-09-04 DIAGNOSIS — M5412 Radiculopathy, cervical region: Secondary | ICD-10-CM | POA: Diagnosis not present

## 2020-09-05 DIAGNOSIS — M546 Pain in thoracic spine: Secondary | ICD-10-CM | POA: Diagnosis not present

## 2020-09-05 DIAGNOSIS — M5412 Radiculopathy, cervical region: Secondary | ICD-10-CM | POA: Diagnosis not present

## 2020-09-05 DIAGNOSIS — M9902 Segmental and somatic dysfunction of thoracic region: Secondary | ICD-10-CM | POA: Diagnosis not present

## 2020-09-05 DIAGNOSIS — M9901 Segmental and somatic dysfunction of cervical region: Secondary | ICD-10-CM | POA: Diagnosis not present

## 2020-09-07 DIAGNOSIS — M546 Pain in thoracic spine: Secondary | ICD-10-CM | POA: Diagnosis not present

## 2020-09-07 DIAGNOSIS — M5412 Radiculopathy, cervical region: Secondary | ICD-10-CM | POA: Diagnosis not present

## 2020-09-07 DIAGNOSIS — M9902 Segmental and somatic dysfunction of thoracic region: Secondary | ICD-10-CM | POA: Diagnosis not present

## 2020-09-07 DIAGNOSIS — M9901 Segmental and somatic dysfunction of cervical region: Secondary | ICD-10-CM | POA: Diagnosis not present

## 2020-09-10 ENCOUNTER — Ambulatory Visit (INDEPENDENT_AMBULATORY_CARE_PROVIDER_SITE_OTHER): Payer: BC Managed Care – PPO | Admitting: Gastroenterology

## 2020-09-10 ENCOUNTER — Other Ambulatory Visit: Payer: Self-pay

## 2020-09-10 ENCOUNTER — Encounter: Payer: Self-pay | Admitting: Gastroenterology

## 2020-09-10 VITALS — BP 109/65 | HR 82 | Temp 97.9°F | Ht 62.5 in | Wt 166.0 lb

## 2020-09-10 DIAGNOSIS — K760 Fatty (change of) liver, not elsewhere classified: Secondary | ICD-10-CM

## 2020-09-10 DIAGNOSIS — K582 Mixed irritable bowel syndrome: Secondary | ICD-10-CM | POA: Diagnosis not present

## 2020-09-10 DIAGNOSIS — R7989 Other specified abnormal findings of blood chemistry: Secondary | ICD-10-CM

## 2020-09-10 DIAGNOSIS — M5412 Radiculopathy, cervical region: Secondary | ICD-10-CM | POA: Diagnosis not present

## 2020-09-10 DIAGNOSIS — M9902 Segmental and somatic dysfunction of thoracic region: Secondary | ICD-10-CM | POA: Diagnosis not present

## 2020-09-10 DIAGNOSIS — M546 Pain in thoracic spine: Secondary | ICD-10-CM | POA: Diagnosis not present

## 2020-09-10 DIAGNOSIS — M9901 Segmental and somatic dysfunction of cervical region: Secondary | ICD-10-CM | POA: Diagnosis not present

## 2020-09-10 NOTE — Progress Notes (Signed)
Cephas Darby, MD [address]  [place]  [place], [place] [address]  Main: [phone]  Fax: [phone]    Gastroenterology Consultation  Referring Provider:     McLean-Scocuzza, Olivia Mackie * Primary Care Physician:  McLean-Scocuzza, Nino Glow, MD Primary Gastroenterologist:  Dr. Cephas Darby Reason for Consultation: Chronic diarrhea, fatty liver       HPI:   Kristin [name] is a 60 y.o. female referred by Dr. Terese Door, Nino Glow, MD  for consultation & management of chronic diarrhea.  Patient reports that she started experiencing nonbloody watery bowel movements since beginning of October, primarily postprandial abdominal cramps in the left mid quadrant followed by a BM.  This has restricted her travel to [place] to take care of her mother.  She underwent work-up including stool studies to rule out infection and came back negative.  She stopped Celebrex thinking that it may have caused diarrhea but unchanged.  She denies any new medications 3 months.  She does have mild diabetes.  TSH is normal.  No evidence of anemia, normal CMP.  Also, limited intake of food due to postprandial urgency that has resulted in few pounds of weight loss.  She is currently taking Pepto-Bismol and Imodium as needed which is controlling her diarrhea. She also reports chronic intermittent heartburn for which she takes omeprazole 20 mg at bedtime. She denies nocturnal heartburn She does have chronic migraine and currently on multiple medications, followed by neurology  She used to work at [employer].  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  She had started experiencing bright red blood per rectum with rectal pressure/swelling on 3/30 which lasted for several days, it was bright red blood per rectum associated with clots.  She reports that her bowel movements remain alternating  between constipation and diarrhea within a week itself.  For last 2 days she noticed recurrence of rectal bleeding again.  Patient reports that she made significant changes in her dietary habits, lost more than 10 pounds since last visit.  She does report lower abdominal cramps during bleeding episodes.  Her hemoglobin was normal on 4/14.  She had an upper endoscopy and colonoscopy in 1/21 which were unremarkable  Follow-up visit 06/05/2020 Patient is here for follow-up of chronic diarrhea and rectal bleeding.  Her rectal bleeding has currently resolved.  Patient experienced rectal discomfort post banding for about 30 hours and she is hesitant to undergo repeat banding today.  Also, her bleeding has not recurred since her last banding.  She has ongoing diarrhea and 1 or 2 days of constipation but diarrhea is her main concern associated with abdominal bloating and cramps.  She is currently taking Bentyl 20 mg as needed which helps to prevent further episodes after she had an episode of diarrhea.  She continues to take amitriptyline 25 mg at bedtime.  Flexible sigmoidoscopy with repeat biopsies was unremarkable except for hemorrhoids.  She has lost few pounds due to restricted diet and not knowing what foods are triggering her diarrheal episodes.  She is planning on a cruise to [place] in September for her 40th anniversary.  She will be traveling to [place] to visit her mom who is fighting with cancer.  That's the only stress in her life at this time.  Follow-up visit 09/10/2020 Patient is here for follow-up of chronic diarrhea alternating with constipation as well as fatty liver.  She reports that last weekend, she had an episode of constipation that triggered a flareup of hemorrhoids.  Otherwise, most of the days she has postprandial diarrhea, nonbloody, usually 1 episode.  Her pancreatic fecal elastase levels were normal.  Stool studies were negative for infection.  I have empirically tried antibiotic for  bacterial overgrowth which did not help.  She continues to take amitriptyline 25 mg at bedtime as well as Bentyl as needed.  Her weight has been stable  NSAIDs: None  Antiplts/Anticoagulants/Anti thrombotics: None  GI Procedures: Had a colonoscopy at age 19, reportedly normal  Sigmoidoscopy 05/21/2020 - Normal mucosa in the entire examined colon. - A single (solitary) ulcer in the distal rectum. - Non-bleeding external hemorrhoids. DIAGNOSIS:  A. COLON, RANDOM; COLD BIOPSY:  - BENIGN COLONIC MUCOSA WITH NO SIGNIFICANT HISTOPATHOLOGIC CHANGE.  - NEGATIVE FOR FEATURES OF MICROSCOPIC COLITIS.  - NEGATIVE FOR DYSPLASIA AND MALIGNANCY.   EGD and colonoscopy 01/31/2019 - Normal duodenal bulb and second portion of the duodenum. Biopsied. - Normal stomach. Biopsied. - Esophagogastric landmarks identified. - Normal gastroesophageal junction and esophagus.  - Preparation of the colon was fair. - Hemorrhoids found on perianal exam. - The examined portion of the ileum was normal. - The entire examined colon is normal. Biopsied. - The distal rectum and anal verge are normal on retroflexion view.  DIAGNOSIS:  A. DUODENUM; COLD BIOPSY:  - DUODENAL MUCOSA WITH NO SIGNIFICANT PATHOLOGIC ALTERATION.  - NEGATIVE FOR FEATURES OF CELIAC DISEASE.  - NEGATIVE FOR DYSPLASIA AND MALIGNANCY.   B. STOMACH; COLD BIOPSY:  - GASTRIC ANTRAL AND OXYNTIC MUCOSA WITH NO SIGNIFICANT PATHOLOGIC  ALTERATION.  - NEGATIVE FOR ACTIVE INFLAMMATION AND H PYLORI.  - NEGATIVE FOR INTESTINAL METAPLASIA, DYSPLASIA, AND MALIGNANCY.   C. COLON, RANDOM; COLD BIOPSY:  - COLONIC MUCOSA WITH NO SIGNIFICANT PATHOLOGIC ALTERATION.  - NEGATIVE FOR MICROSCOPIC COLITIS, DYSPLASIA, AND MALIGNANCY.   D. COLON POLYP, SIGMOID; COLD SNARE:  - TUBULAR ADENOMA.  - NEGATIVE FOR HIGH-GRADE DYSPLASIA AND MALIGNANCY.  She denies family history of inflammatory bowel disease, celiac disease, GI malignancy  Past Medical History:   Diagnosis Date   Allergy    Arthritis    Basal cell carcinoma    nose, left cheek, right upper lip, right posterior shoulder most recent 6 years ago on left cheek   Bleeding    Chicken pox    Complication of anesthesia    blood pressure elevated after gastric sleeve surgery in recovery, pt does not want mask over face   COVID-19    03/01/20 had MAB 03/03/20 and felt better next day 03/04/20   Depression    Diabetes mellitus without complication ([facility])    pre-diabetic currently, was diabetic prior to gastric sleeve surgery    Disorder of right rotator cuff    10/03/19 MRI right shoulder abnormal Dr. Maurine Cane   Fatty liver    Gout    Hematuria 10/15/2018   History of shingles    Hyperlipidemia    Hypertension    no problems since losing weight after gastric sleeve surgery   IBS (irritable bowel syndrome)    Insomnia    Migraine    Obesity    Osteopenia     Past Surgical History:  Procedure Laterality Date   ABDOMINAL HYSTERECTOMY     no h/o abnormal pap still has 1 ovary left   ANKLE ARTHROSCOPY Left 05/29/2016   Procedure: ANKLE ARTHROSCOPY;  Surgeon: [name], DPM;  Location: [facility];  Service: Podiatry;  Laterality: Left;   APPENDECTOMY     basal cell removed     [place] PROPOFOL N/A 01/31/2019   Procedure: COLONOSCOPY WITH PROPOFOL;  Surgeon: Lin Landsman, MD;  Location[facility];  Service: Gastroenterology;  Laterality: N/A;   cyst removed from wrist     ESOPHAGOGASTRODUODENOSCOPY (EGD) WITH PROPOFOL N/A 01/31/2019   Procedure: ESOPHAGOGASTRODUODENOSCOPY (EGD) WITH PROPOFOL;  Surgeon: Lin Landsman, MD;  Location: [facility];  Service: Gastroenterology;  Laterality: N/A;   FLEXIBLE SIGMOIDOSCOPY N/A 05/21/2020   Procedure: FLEXIBLE SIGMOIDOSCOPY;  Surgeon: Lin Landsman, MD;  Location: [facility] ENDOSCOPY;  Service: Gastroenterology;  Laterality: N/A;   FOOT SURGERY     left  foot 11/2016 Dr. Laymond Purser    left elbow surgery     07/2017 Dr Leanor Kail epicondylitis    MOHS SURGERY     x 2 face nose and left cheek    OSTEOCHONDROMA EXCISION Left 05/29/2016   Procedure: OSTEOCHONDRAL DRILLING TALUS;  Surgeon: [name], DPM;  Location: [facility];  Service: Podiatry;  Laterality: Left;   PLANTAR FASCIA RELEASE Left 05/29/2016   Procedure: ENDOSCOPIC PLANTAR FASCIOTOMY;  Surgeon: [name], DPM;  Location: [facility];  Service: Podiatry;  Laterality: Left;   ROTATOR CUFF REPAIR     ? right likely shoulder   STOMACH SURGERY  2017   gastric sleeve surgery   TENNIS ELBOW RELEASE/NIRSCHEL PROCEDURE Left 08/05/2017   Procedure: TENNIS ELBOW RELEASE/NIRSCHEL PROCEDURE;  Surgeon: Leanor Kail, MD;  Location: [facility];  Service: Orthopedics;  Laterality: Left;   TUBAL LIGATION     wedge resection of ovary  1982    Current Outpatient Medications:    albuterol (VENTOLIN HFA) 108 (90 Base) MCG/ACT inhaler, Inhale 1-2 puffs into the lungs every 6 (six) hours as needed for wheezing or shortness of breath., Disp: 18 g, Rfl: 11   amitriptyline (ELAVIL) 25 MG tablet, TAKE 1 TABLET (25 MG TOTAL) BY MOUTH AT BEDTIME. INCREASE TO '50MG'$  AFTER 2 WEEKS IF NEEDED, Disp: 180 tablet, Rfl: 0   amLODipine (NORVASC) 5 MG tablet, Take 1 tablet (5 mg total) by mouth daily. And stop 2.5 mg daily (2 pills), Disp: 90 tablet, Rfl: 3   Cholecalciferol (DIALYVITE VITAMIN D 5000 PO), Take 5,000 Units by mouth every other day., Disp: , Rfl:    Continuous Blood Gluc Sensor (FREESTYLE LIBRE 2 SENSOR) MISC, Use to check glucose twice daily, Disp: 2 each, Rfl: 11   dicyclomine (BENTYL) 20 MG tablet, Take 20 mg by mouth 2 (two) times daily as needed for spasms., Disp: , Rfl:    fexofenadine (ALLEGRA) 180 MG tablet, Take 1 tablet (180 mg total) by mouth daily as needed for allergies or rhinitis., Disp: 90 tablet, Rfl: 3   gabapentin (NEURONTIN) 300 MG capsule, Take 1 capsule (300 mg total) by mouth 3  (three) times daily as needed., Disp: 90 capsule, Rfl: 11   hydrOXYzine (ATARAX/VISTARIL) 25 MG tablet, Take 25 mg by mouth 2 (two) times daily as needed., Disp: , Rfl:    montelukast (SINGULAIR) 10 MG tablet, Take 1 tablet (10 mg total) by mouth at bedtime., Disp: 90 tablet, Rfl: 3   Multiple Vitamin (MULTIVITAMIN) tablet, Take 1 tablet by mouth daily., Disp: , Rfl:    omeprazole (PRILOSEC) 40 MG capsule, TAKE 1 CAPSULE (40 MG TOTAL) BY MOUTH DAILY BEFORE SUPPER., Disp: 90 capsule, Rfl: 0  ondansetron (ZOFRAN-ODT) 4 MG disintegrating tablet, Take 1 tablet (4 mg total) by mouth every 8 (eight) hours as needed. for nausea, Disp: 30 tablet, Rfl: 5   potassium chloride (MICRO-K) 10 MEQ CR capsule, Take 2 capsules (20 mEq total) by mouth daily., Disp: 180 capsule, Rfl: 3   Rimegepant Sulfate (NURTEC) 75 MG TBDP, Take 75 mg by mouth daily as needed (take for abortive therapy of migraine, no more than 1 tablet in 24 hours or 10 per month)., Disp: 10 tablet, Rfl: 11   rosuvastatin (CRESTOR) 20 MG tablet, Take 1 tablet (20 mg total) by mouth daily., Disp: 90 tablet, Rfl: 3   [START ON 09/18/2020] tirzepatide (MOUNJARO) 5 MG/0.5ML Pen, Inject 5 mg into the skin once a week., Disp: 2 mL, Rfl: 2   topiramate (TOPAMAX) 50 MG tablet, Take 1 tablet (50 mg total) by mouth 2 (two) times daily., Disp: 90 tablet, Rfl: 3   triamcinolone (KENALOG) 0.1 %, Apply 1 application topically 2 (two) times daily. Prn itching hives on body not face, Disp: 454 g, Rfl: 0   triamterene-hydrochlorothiazide (MAXZIDE-25) 37.5-25 MG tablet, Take 0.5 tablets by mouth daily. In the am, Disp: 90 tablet, Rfl: 3   zolpidem (AMBIEN CR) 12.5 MG CR tablet, TAKE 1 TABLET (12.5 MG TOTAL) BY MOUTH AT BEDTIME AS NEEDED., Disp: 30 tablet, Rfl: 5   Family History  Problem Relation Age of Onset   Heart disease Mother    Breast cancer Mother 62       metastatic, now hip, spine, and leg    Arthritis Mother    Hyperlipidemia Mother    Cancer  Mother        spinal surgery. breast dx'ed age 68 now metastatic 65 as of 07/13/18   Cystic fibrosis Mother        carrier    Heart disease Father    Lung cancer Father        smoker   Hyperlipidemia Father    Hypertension Father    Diabetes Sister    Stroke Sister    Other Sister        covid +   Diabetes Maternal Aunt    Diabetes Maternal Uncle    Diabetes Maternal Grandfather    Hyperlipidemia Maternal Grandfather    Arthritis Maternal Grandmother    Sudden death Cousin    Diabetes Maternal Aunt    Diabetes Sister    Cystic fibrosis Other        great niece    Colon cancer Neg Hx    Ovarian cancer Neg Hx      Social History   Tobacco Use   Smoking status: Never   Smokeless tobacco: Never  Vaping Use   Vaping Use: Never used  Substance Use Topics   Alcohol use: Never   Drug use: Never    Allergies as of 09/10/2020 - Review Complete 09/10/2020  Allergen Reaction Noted   Tetracyclines & related Swelling and Rash 06/04/2014   Amoxicillin  12/10/2018   Alcohol Rash 12/07/2014   Azithromycin Rash 07/19/2018   Metoprolol Diarrhea 12/07/2014    Review of Systems:    All systems reviewed and negative except where noted in HPI.   Physical Exam:  BP 109/65 (BP Location: Left Arm, Patient Position: Sitting, Cuff Size: Normal)   Pulse 82   Temp 97.9 F (36.6 C) (Oral)   Ht 5' 2.5" (1.588 m)   Wt 166 lb (75.3 kg)   BMI 29.88 kg/m  No LMP recorded.  Patient has had a hysterectomy.  General:   Alert,  Well-developed, well-nourished, pleasant and cooperative in NAD Head:  Normocephalic and atraumatic. Eyes:  Sclera clear, no icterus.   Conjunctiva pink. Ears:  Normal auditory acuity. Nose:  No deformity, discharge, or lesions. Mouth:  No deformity or lesions,oropharynx pink & moist. Neck:  Supple; no masses or thyromegaly. Lungs:  Respirations even and unlabored.  Clear throughout to auscultation.   No wheezes, crackles, or rhonchi. No acute distress. Heart:   Regular rate and rhythm; no murmurs, clicks, rubs, or gallops. Abdomen:  Normal bowel sounds. Soft, non-tender and non-distended without masses, hepatosplenomegaly or hernias noted.  No guarding or rebound tenderness.   Rectal: Not performed Msk:  Symmetrical without gross deformities. Good, equal movement & strength bilaterally. Pulses:  Normal pulses noted. Extremities:  No clubbing or edema.  No cyanosis. Neurologic:  Alert and oriented x3;  grossly normal neurologically. Skin:  Intact without significant lesions or rashes. No jaundice. Psych:  Alert and cooperative. Normal mood and affect.  Imaging Studies: Reviewed  Assessment and Plan:   Harkiran Delhomme Wildes is a 60 y.o. white female with BMI 34, metabolic syndrome seen in consultation for rectal bleeding, chronic diarrhea and chronic GERD  Chronic diarrhea alternating with mild episodes of constipation Stool for C. difficile negative in the past EGD and colonoscopy with biopsies were unremarkable, no evidence of celiac disease, microscopic colitis, IBD Pancreatic fecal elastase levels were normal GI pathogen panel is negative for infection Continue amitriptyline 25 mg at bedtime S/p empiric trial of antibiotics for bacterial overgrowth  Rectal bleeding: Secondary to external hemorrhoids, currently resolved S/p ligation of RP hemorrhoid Defer hemorrhoid ligation today  Chronic GERD EGD with no evidence of Barrett's esophagus or erosive esophagitis Continue omeprazole 40 mg once daily Continue antireflux lifestyle  Fatty liver, elevated LFTs, s/p cholecystectomy Monitor LFTs every 6 months Discussed about control of diabetes, weight loss and exercise Recommend viral hepatitis panel and other secondary liver disease work-up and patient is agreeable   Follow up in 6 months   Cephas Darby, MD

## 2020-09-12 ENCOUNTER — Telehealth: Payer: Self-pay | Admitting: Emergency Medicine

## 2020-09-12 ENCOUNTER — Ambulatory Visit
Admission: RE | Admit: 2020-09-12 | Discharge: 2020-09-12 | Disposition: A | Discharge status: Home / Self Care | Payer: BC Managed Care – PPO | Source: Ambulatory Visit | Attending: Emergency Medicine | Admitting: Emergency Medicine

## 2020-09-12 ENCOUNTER — Telehealth: Payer: Self-pay

## 2020-09-12 ENCOUNTER — Encounter: Payer: Self-pay | Admitting: Emergency Medicine

## 2020-09-12 ENCOUNTER — Other Ambulatory Visit: Payer: Self-pay

## 2020-09-12 ENCOUNTER — Ambulatory Visit
Admission: EM | Admit: 2020-09-12 | Discharge: 2020-09-12 | Disposition: A | Discharge status: Home / Self Care | Payer: BC Managed Care – PPO | Attending: Emergency Medicine | Admitting: Emergency Medicine

## 2020-09-12 DIAGNOSIS — W19XXXA Unspecified fall, initial encounter: Secondary | ICD-10-CM | POA: Insufficient documentation

## 2020-09-12 DIAGNOSIS — M7989 Other specified soft tissue disorders: Secondary | ICD-10-CM | POA: Diagnosis not present

## 2020-09-12 NOTE — Discharge Instructions (Addendum)
Present to [facility] at 1pm for ultrasound imaging of the right lower leg to rule out blood clot.  I will call you with test results.  If there is no blood clot, keep right leg elevated, iced, and use tylenol/ibuprofen for discomfort.

## 2020-09-12 NOTE — Telephone Encounter (Signed)
[place] negative for DVT. Relayed results to patient. Advised to continue with treatment plan as discussed to include elevation, rest, ice, tylenol/ibuprofen. Advised that area will heal on its own over the next 1-2 weeks. Patient verbalized understanding and agreed with plan.

## 2020-09-12 NOTE — ED Provider Notes (Signed)
Chief Complaint   Chief Complaint  Patient presents with   Leg Injury     Subjective, HPI  Kristin [name] is a 60 y.o. female who presents with right lower extremity injury which occurred on Monday after falling into a shelf and hitting the area.  Patient reports a hard lump and reports pain.  Patient reports a family history of DVT and would like to rule out blood clot.  No shortness of breath chest pain, dizziness or fainting reported.  No fever reported.  History obtained from patient.  Patient's problem list, past medical and social history, medications, and allergies were reviewed by me and updated in Epic.    ROS  See HPI.  Objective   Vitals:   09/12/20 1221  BP: 119/80  Pulse: 82  Resp: 20  Temp: 97.9 F (36.6 C)  SpO2: 98%    Vital signs and nursing note reviewed.   General: Appears well-developed and well-nourished. No acute distress.  Head: Normocephalic and atraumatic.   Neck: Normal range of motion, neck is supple.  Cardiovascular: Normal rate. Pulm/Chest: No respiratory distress.  Musculoskeletal: Right lower extremity: Moderate TTP noted to area of midline right lower extremity.  Area exhibits mild bruising with firm area and abrasion measuring approximately 2 cm x 2 cm.  5/5 strength, full sensation, < 2 sec cap refill.  Neurological: Alert and oriented to person, place, and time.  Skin: Skin is warm and dry.   Psychiatric: Normal mood, affect, behavior, and thought content.    Data  No results found for any visits on 09/12/20.   Assessment & Plan  1. Swelling of right lower extremity - US Venous Img Lower Unilateral Right (DVT); Standing  2. Fall, initial encounter  3. Fall - US Venous Img Lower Unilateral Right (DVT)   60 y.o. female presents with right lower extremity injury which occurred on Monday after falling into a shelf and hitting the area.  Patient reports a hard lump and reports pain.  Patient reports a family history of DVT  and would like to rule out blood clot.  No shortness of breath chest pain, dizziness or fainting reported.  No fever reported.  Chart review completed.  Ordered outpatient ultrasound to right lower extremity to rule out DVT.  Given appearance of area today, likely deep contusion, but unable to completely rule out DVT without finalized report of ultrasound.  Explained to the patient that we would call her with ultrasound findings and discuss treatment plan given these results.  Advised that if her right lower extremity ultrasound shows a DVT we would discuss treatment options, but did explain that if there is no DVT that she will need to ice the area, elevate the area and use Tylenol or ibuprofen until healed.  Patient verbalized understanding and agreed with plan.  Patient stable upon discharge.  Return as needed.  Plan:   Discharge Instructions      Present to [facility] center at 1pm for ultrasound imaging of the right lower leg to rule out blood clot.  I will call you with test results.  If there is no blood clot, keep right leg elevated, iced, and use tylenol/ibuprofen for discomfort.           Serafina Royals, [place] 09/12/20 1240

## 2020-09-12 NOTE — Telephone Encounter (Signed)
Patient was triaged by Access Nurse. However,  Access Nurse did not transfer her to this office. She was told to call the office to get an appointment today. I offered the patient a virtual visit / or in house at another location ,but due to it being her leg and she wanted someone to physically touch her  leg and she did not want to travel long distance. Her decision was to go to [facility] urgent care on [address].

## 2020-09-12 NOTE — ED Triage Notes (Signed)
Pt fell on Monday in garage onto a shelf onto right calf. Today there is a hard lump there and is painful. Pt kindly requests a rule out of DVT if possible.

## 2020-09-12 NOTE — Telephone Encounter (Signed)
Pt fell on Monday on right leg. There is a scrape and a bruise. She is not worried about those, she is concerned because there is now a large lump at the site of the bruise. It hurts pt to stand. Pt is worried about clotting. Transferred to Baxter Flattery at Access Nurse to triage. I let her know  that there are no appts avail in office or virtual in our office but may have some at another office.

## 2020-09-14 DIAGNOSIS — M546 Pain in thoracic spine: Secondary | ICD-10-CM | POA: Diagnosis not present

## 2020-09-14 DIAGNOSIS — M9902 Segmental and somatic dysfunction of thoracic region: Secondary | ICD-10-CM | POA: Diagnosis not present

## 2020-09-14 DIAGNOSIS — M9901 Segmental and somatic dysfunction of cervical region: Secondary | ICD-10-CM | POA: Diagnosis not present

## 2020-09-14 DIAGNOSIS — M5412 Radiculopathy, cervical region: Secondary | ICD-10-CM | POA: Diagnosis not present

## 2020-09-17 DIAGNOSIS — M9901 Segmental and somatic dysfunction of cervical region: Secondary | ICD-10-CM | POA: Diagnosis not present

## 2020-09-17 DIAGNOSIS — M546 Pain in thoracic spine: Secondary | ICD-10-CM | POA: Diagnosis not present

## 2020-09-17 DIAGNOSIS — M5412 Radiculopathy, cervical region: Secondary | ICD-10-CM | POA: Diagnosis not present

## 2020-09-17 DIAGNOSIS — M9902 Segmental and somatic dysfunction of thoracic region: Secondary | ICD-10-CM | POA: Diagnosis not present

## 2020-09-19 ENCOUNTER — Other Ambulatory Visit: Payer: Self-pay | Admitting: Gastroenterology

## 2020-09-19 DIAGNOSIS — M9902 Segmental and somatic dysfunction of thoracic region: Secondary | ICD-10-CM | POA: Diagnosis not present

## 2020-09-19 DIAGNOSIS — M546 Pain in thoracic spine: Secondary | ICD-10-CM | POA: Diagnosis not present

## 2020-09-19 DIAGNOSIS — M9901 Segmental and somatic dysfunction of cervical region: Secondary | ICD-10-CM | POA: Diagnosis not present

## 2020-09-19 DIAGNOSIS — M5412 Radiculopathy, cervical region: Secondary | ICD-10-CM | POA: Diagnosis not present

## 2020-09-20 ENCOUNTER — Other Ambulatory Visit: Payer: Self-pay | Admitting: Internal Medicine

## 2020-09-20 DIAGNOSIS — J309 Allergic rhinitis, unspecified: Secondary | ICD-10-CM

## 2020-10-08 DIAGNOSIS — M5412 Radiculopathy, cervical region: Secondary | ICD-10-CM | POA: Diagnosis not present

## 2020-10-08 DIAGNOSIS — M546 Pain in thoracic spine: Secondary | ICD-10-CM | POA: Diagnosis not present

## 2020-10-08 DIAGNOSIS — M9901 Segmental and somatic dysfunction of cervical region: Secondary | ICD-10-CM | POA: Diagnosis not present

## 2020-10-08 DIAGNOSIS — M9902 Segmental and somatic dysfunction of thoracic region: Secondary | ICD-10-CM | POA: Diagnosis not present

## 2020-10-10 DIAGNOSIS — M5412 Radiculopathy, cervical region: Secondary | ICD-10-CM | POA: Diagnosis not present

## 2020-10-10 DIAGNOSIS — M9902 Segmental and somatic dysfunction of thoracic region: Secondary | ICD-10-CM | POA: Diagnosis not present

## 2020-10-10 DIAGNOSIS — M546 Pain in thoracic spine: Secondary | ICD-10-CM | POA: Diagnosis not present

## 2020-10-10 DIAGNOSIS — M9901 Segmental and somatic dysfunction of cervical region: Secondary | ICD-10-CM | POA: Diagnosis not present

## 2020-10-12 ENCOUNTER — Telehealth: Payer: Self-pay | Admitting: Gastroenterology

## 2020-10-12 DIAGNOSIS — M9901 Segmental and somatic dysfunction of cervical region: Secondary | ICD-10-CM | POA: Diagnosis not present

## 2020-10-12 DIAGNOSIS — M546 Pain in thoracic spine: Secondary | ICD-10-CM | POA: Diagnosis not present

## 2020-10-12 DIAGNOSIS — M5412 Radiculopathy, cervical region: Secondary | ICD-10-CM | POA: Diagnosis not present

## 2020-10-12 DIAGNOSIS — M9902 Segmental and somatic dysfunction of thoracic region: Secondary | ICD-10-CM | POA: Diagnosis not present

## 2020-10-12 NOTE — Telephone Encounter (Signed)
Going to ask patient to just to come to [facility] office to have her labs done during the lab hours. Called and left a message for call back

## 2020-10-12 NOTE — Telephone Encounter (Signed)
[facility] calling requesting a return call. Pt has several labs and she says they need to be put all together.

## 2020-10-15 DIAGNOSIS — K760 Fatty (change of) liver, not elsewhere classified: Secondary | ICD-10-CM | POA: Diagnosis not present

## 2020-10-15 DIAGNOSIS — M5412 Radiculopathy, cervical region: Secondary | ICD-10-CM | POA: Diagnosis not present

## 2020-10-15 DIAGNOSIS — R7989 Other specified abnormal findings of blood chemistry: Secondary | ICD-10-CM | POA: Diagnosis not present

## 2020-10-15 DIAGNOSIS — M9901 Segmental and somatic dysfunction of cervical region: Secondary | ICD-10-CM | POA: Diagnosis not present

## 2020-10-15 DIAGNOSIS — M546 Pain in thoracic spine: Secondary | ICD-10-CM | POA: Diagnosis not present

## 2020-10-15 DIAGNOSIS — M9902 Segmental and somatic dysfunction of thoracic region: Secondary | ICD-10-CM | POA: Diagnosis not present

## 2020-10-15 NOTE — Telephone Encounter (Signed)
Informed patient I was sorry that they were not able to draw her labs in [place]. Patient is going to come to our office today to have the labs draw

## 2020-10-16 DIAGNOSIS — M546 Pain in thoracic spine: Secondary | ICD-10-CM | POA: Diagnosis not present

## 2020-10-16 DIAGNOSIS — M5412 Radiculopathy, cervical region: Secondary | ICD-10-CM | POA: Diagnosis not present

## 2020-10-16 DIAGNOSIS — M9901 Segmental and somatic dysfunction of cervical region: Secondary | ICD-10-CM | POA: Diagnosis not present

## 2020-10-16 DIAGNOSIS — M9902 Segmental and somatic dysfunction of thoracic region: Secondary | ICD-10-CM | POA: Diagnosis not present

## 2020-10-16 LAB — FERRITIN: Ferritin: 133 ng/mL (ref 15–150)

## 2020-10-16 LAB — HEPATIC FUNCTION PANEL
ALT: 40 IU/L — ABNORMAL HIGH (ref 0–32)
AST: 30 IU/L (ref 0–40)
Albumin: 4.7 g/dL (ref 3.8–4.9)
Alkaline Phosphatase: 96 IU/L (ref 44–121)
Bilirubin Total: 0.3 mg/dL (ref 0.0–1.2)
Bilirubin, Direct: 0.12 mg/dL (ref 0.00–0.40)
Total Protein: 7.4 g/dL (ref 6.0–8.5)

## 2020-10-16 LAB — HEPATITIS B SURFACE ANTIBODY,QUALITATIVE: Hep B Surface Ab, Qual: REACTIVE

## 2020-10-16 LAB — MITOCHONDRIAL ANTIBODIES: Mitochondrial Ab: <20 Units (ref 0.0–20.0)

## 2020-10-16 LAB — HEPATITIS B CORE ANTIBODY, TOTAL: Hep B Core Total Ab: NEGATIVE

## 2020-10-16 LAB — CERULOPLASMIN: Ceruloplasmin: 30.4 mg/dL (ref 19.0–39.0)

## 2020-10-16 LAB — ALPHA-1-ANTITRYPSIN: A-1 Antitrypsin: 151 mg/dL (ref 101–187)

## 2020-10-16 LAB — HEPATITIS C ANTIBODY: Hep C Virus Ab: <0.1 s/co ratio (ref 0.0–0.9)

## 2020-10-16 LAB — ANTI-SMOOTH MUSCLE ANTIBODY, IGG: Smooth Muscle Ab: 7 Units (ref 0–19)

## 2020-10-16 LAB — HEPATITIS B SURFACE ANTIGEN: Hepatitis B Surface Ag: NEGATIVE

## 2020-10-16 LAB — ANA: Anti Nuclear Antibody (ANA): POSITIVE — AB

## 2020-10-17 ENCOUNTER — Telehealth: Payer: Self-pay

## 2020-10-17 NOTE — Telephone Encounter (Signed)
-----   Message from Lin Landsman, MD sent at 10/16/2020  7:36 PM EDT ----- Please inform patient that her labs look reassuring and her liver enzymes are gradually improving  RV

## 2020-10-17 NOTE — Telephone Encounter (Signed)
Patient verbalized understanding of results  

## 2020-10-22 ENCOUNTER — Telehealth: Payer: BC Managed Care – PPO

## 2020-10-22 DIAGNOSIS — M9901 Segmental and somatic dysfunction of cervical region: Secondary | ICD-10-CM | POA: Diagnosis not present

## 2020-10-22 DIAGNOSIS — M546 Pain in thoracic spine: Secondary | ICD-10-CM | POA: Diagnosis not present

## 2020-10-22 DIAGNOSIS — M5412 Radiculopathy, cervical region: Secondary | ICD-10-CM | POA: Diagnosis not present

## 2020-10-22 DIAGNOSIS — M9902 Segmental and somatic dysfunction of thoracic region: Secondary | ICD-10-CM | POA: Diagnosis not present

## 2020-10-24 ENCOUNTER — Ambulatory Visit: Payer: BC Managed Care – PPO | Admitting: Pharmacist

## 2020-10-24 VITALS — Wt 155.0 lb

## 2020-10-24 DIAGNOSIS — I10 Essential (primary) hypertension: Secondary | ICD-10-CM

## 2020-10-24 DIAGNOSIS — M9901 Segmental and somatic dysfunction of cervical region: Secondary | ICD-10-CM | POA: Diagnosis not present

## 2020-10-24 DIAGNOSIS — M5412 Radiculopathy, cervical region: Secondary | ICD-10-CM | POA: Diagnosis not present

## 2020-10-24 DIAGNOSIS — E785 Hyperlipidemia, unspecified: Secondary | ICD-10-CM

## 2020-10-24 DIAGNOSIS — E1165 Type 2 diabetes mellitus with hyperglycemia: Secondary | ICD-10-CM

## 2020-10-24 DIAGNOSIS — M546 Pain in thoracic spine: Secondary | ICD-10-CM | POA: Diagnosis not present

## 2020-10-24 DIAGNOSIS — M9902 Segmental and somatic dysfunction of thoracic region: Secondary | ICD-10-CM | POA: Diagnosis not present

## 2020-10-24 MED ORDER — TIRZEPATIDE 5 MG/0.5ML ~~LOC~~ SOAJ: 5.0000 mg | SUBCUTANEOUS | 1 refills | Status: DC

## 2020-10-24 MED ORDER — AMLODIPINE BESYLATE 5 MG PO TABS: 5.0000 mg | ORAL_TABLET | Freq: Every day | ORAL | 3 refills | Status: DC

## 2020-10-24 NOTE — Chronic Care Management (AMB) (Signed)
Care Management   Pharmacy Note  10/24/2020 Name: LOUNELL SCHUMACHER MRN: [id number] DOB: [birth year]  Subjective: Murvin Natal Metsker is a 60 y.o. year old female who is a primary care patient of McLean-Scocuzza, Nino Glow, MD. Collaborating with covering provider Tommi Rumps, MD, while PCP is on leave. The Care Management team was consulted for assistance with care management and care coordination needs.    Engaged with patient by telephone for follow up visit in response to provider referral for pharmacy case management and/or care coordination services.   The patient was given information about Care Management services today including:  Care Management services includes personalized support from designated clinical staff supervised by the patient's primary care provider, including individualized plan of care and coordination with other care providers. 24/7 contact phone numbers for assistance for urgent and routine care needs. The patient may stop case management services at any time by phone call to the office staff.  Patient agreed to services and consent obtained.  Assessment:  Review of patient status, including review of consultants reports, laboratory and other test data, was performed as part of comprehensive evaluation and provision of chronic care management services.   SDOH (Social Determinants of Health) assessments and interventions performed:  SDOH Interventions    Flowsheet Row Most Recent Value  SDOH Interventions   Financial Strain Interventions Intervention Not Indicated        Objective:  Lab Results  Component Value Date   CREATININE 1.02 07/18/2020   CREATININE 0.97 05/03/2020   CREATININE 1.04 04/23/2020    Lab Results  Component Value Date   HGBA1C 7.4 (H) 07/18/2020       Component Value Date/Time   CHOL 201 (H) 07/18/2020 1225   CHOL 147 05/05/2019 0838   TRIG 149.0 07/18/2020 1225   HDL 63.50 07/18/2020 1225   HDL 58 05/05/2019 0838    CHOLHDL 3 07/18/2020 1225   VLDL 29.8 07/18/2020 1225   LDLCALC 108 (H) 07/18/2020 1225   LDLCALC 62 05/05/2019 0838     BP Readings from Last 3 Encounters:  09/12/20 119/80  09/10/20 109/65  08/07/20 137/84    Care Plan  Allergies  Allergen Reactions   Tetracyclines & Related Swelling and Rash    FACE SWELL, RASH ON CHEST   Amoxicillin    Alcohol Rash    ABDOMINAL CRAMPS, drinking alcohol   Azithromycin Rash   Metoprolol Diarrhea    Medications Reviewed Today     Reviewed by Toma Copier, RN (Registered Nurse) on 09/12/20 at 1224  Med List Status: <None>   Medication Order Taking? Sig Documenting Provider Last Dose Status Informant  albuterol (VENTOLIN HFA) 108 (90 Base) MCG/ACT inhaler [id number]  Inhale 1-2 puffs into the lungs every 6 (six) hours as needed for wheezing or shortness of breath. McLean-Scocuzza, Nino Glow, MD  Active   amitriptyline (ELAVIL) 25 MG tablet [id number]  TAKE 1 TABLET (25 MG TOTAL) BY MOUTH AT BEDTIME. INCREASE TO 50MG  AFTER 2 WEEKS IF NEEDED Vanga, Tally Due, MD  Active   amLODipine (NORVASC) 5 MG tablet [id number]  Take 1 tablet (5 mg total) by mouth daily. And stop 2.5 mg daily (2 pills) McLean-Scocuzza, Nino Glow, MD  Active   Cholecalciferol (DIALYVITE VITAMIN D 5000 PO) [id number]  Take 5,000 Units by mouth every other day. [provider]  Active   Continuous Blood Gluc Sensor (FREESTYLE LIBRE 2 SENSOR) [place] [id number]  Use to check glucose twice daily McLean-Scocuzza, Nino Glow, MD  Active  dicyclomine (BENTYL) 20 MG tablet [id number]  Take 20 mg by mouth 2 (two) times daily as needed for spasms. [provider]  Active   fexofenadine (ALLEGRA) 180 MG tablet [id number]  Take 1 tablet (180 mg total) by mouth daily as needed for allergies or rhinitis. McLean-Scocuzza, Nino Glow, MD  Active   gabapentin (NEURONTIN) 300 MG capsule [id number]  Take 1 capsule (300 mg total) by mouth 3 (three) times daily as needed. Melvenia Beam,  MD  Active            Med Note Darnelle Maffucci, Jacora E   Fri Aug 17, 2020  1:21 PM) QPM PRN  hydrOXYzine (ATARAX/VISTARIL) 25 MG tablet [id number]  Take 25 mg by mouth 2 (two) times daily as needed. [provider]  Active   montelukast (SINGULAIR) 10 MG tablet [id number]  Take 1 tablet (10 mg total) by mouth at bedtime. McLean-Scocuzza, Nino Glow, MD  Active   Multiple Vitamin (MULTIVITAMIN) tablet [id number]  Take 1 tablet by mouth daily. [provider]  Active   omeprazole (PRILOSEC) 40 MG capsule [id number]  TAKE 1 CAPSULE (40 MG TOTAL) BY MOUTH DAILY BEFORE SUPPER. Lin Landsman, MD  Active   ondansetron (ZOFRAN-ODT) 4 MG disintegrating tablet [id number]  Take 1 tablet (4 mg total) by mouth every 8 (eight) hours as needed. for nausea McLean-Scocuzza, Nino Glow, MD  Active   potassium chloride (MICRO-K) 10 MEQ CR capsule [id number]  Take 2 capsules (20 mEq total) by mouth daily. McLean-Scocuzza, Nino Glow, MD  Active   Rimegepant Sulfate (NURTEC) 75 MG TBDP [id number]  Take 75 mg by mouth daily as needed (take for abortive therapy of migraine, no more than 1 tablet in 24 hours or 10 per month). Lomax, Amy, NP  Active   rosuvastatin (CRESTOR) 20 MG tablet [id number]  Take 1 tablet (20 mg total) by mouth daily. McLean-Scocuzza, Nino Glow, MD  Active   tirzepatide [facility]) 5 MG/0.5ML Pen [id number]  Inject 5 mg into the skin once a week. McLean-Scocuzza, Nino Glow, MD  Active   topiramate (TOPAMAX) 50 MG tablet [id number]  Take 1 tablet (50 mg total) by mouth 2 (two) times daily. Debbora Presto, NP  Active            Med Note SUETTA, HOFFMEISTER May 28, 2020  1:06 PM) Taking once daily QAM  triamcinolone (KENALOG) 0.1 % [id number]  Apply 1 application topically 2 (two) times daily. Prn itching hives on body not face McLean-Scocuzza, Nino Glow, MD  Active   triamterene-hydrochlorothiazide (MAXZIDE-25) 37.5-25 MG tablet [id number]  Take 0.5 tablets by mouth daily. In the am McLean-Scocuzza, Nino Glow,  MD  Active   zolpidem (AMBIEN CR) 12.5 MG CR tablet [id number]  TAKE 1 TABLET (12.5 MG TOTAL) BY MOUTH AT BEDTIME AS NEEDED. McLean-Scocuzza, Nino Glow, MD  Active             Patient Active Problem List   Diagnosis Date Noted   Diabetic polyneuropathy associated with diabetes mellitus due to underlying condition ([place]) 08/08/2020   Chronic migraine w/o aura, not intractable, w/o stat migr 08/08/2020   Adhesive capsulitis of right shoulder 07/23/2020   Rectal bleeding 05/03/2020   CTS (carpal tunnel syndrome) 04/05/2020   Stress 03/28/2020   Hives 03/28/2020   Annual physical exam 08/19/2019   Hypertension associated with diabetes ([place]) 08/18/2019   Obesity, diabetes, and hypertension syndrome ([place]) 08/18/2019   Irritable bowel syndrome 03/01/2019   Chronic diarrhea  DM2 (diabetes mellitus, type 2) ([place]) 08/31/2018   Vitamin D deficiency 08/31/2018   Chronic migraine without aura, with intractable migraine, so stated, with status migrainosus 11/10/2017   Chronic intractable headache 10/20/2017   Obesity (BMI 30-39.9) 05/06/2017   Fatty liver 05/06/2017   Injury of right wrist 06/26/2016   Hyperlipidemia 12/12/2015   Anxiety 12/12/2015   Obstructive sleep apnea syndrome 10/10/2014   Gastroesophageal reflux disease with hiatal hernia 09/29/2014   Morbid obesity ([place]) 09/29/2014   Benign essential hypertension 08/14/2014   Palpitations 08/14/2014   Hypertension 07/17/2014   Insomnia 07/17/2014    Conditions to be addressed/monitored: HTN, HLD, and DMII  Care Plan : PharmD - Medication Management  Updates made by De Hollingshead, RPH-CPP since 10/24/2020 12:00 AM     Problem: Diabetes, IBS-D, HLD, Anxiety      Long-Range Goal: Disease Progression Prevention   Start Date: 05/28/2020  This Visit's Progress: On track  Recent Progress: On track  Priority: High  Note:   Current Barriers:  Unable to achieve control of diabetes   Pharmacist Clinical Goal(s):  Over the  next 90 days, patient will achieve control of diabetes as evidenced by A1c through collaboration with PharmD and provider.   Interventions: 1:1 collaboration with McLean-Scocuzza, Nino Glow, MD regarding development and update of comprehensive plan of care as evidenced by provider attestation and co-signature Inter-disciplinary care team collaboration (see longitudinal plan of care) Comprehensive medication review performed; medication list updated in electronic medical record  SDOH: Mother of 3 sons. Splits time between here and [place] caring for her ailing mother. Sister in [place] is a Marine scientist.  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   Diabetes: Uncontrolled but improved; current treatment: Mounjaro 5 mg weekly Denies GI upset since changing to [facility]. Does note decrease in appetite. Is able to moderate portion sizes.  Current glucose readings: using Libre 2 CGM. Readings are in range 95% time, 0 hights, 5% lows. Denies symptomatic hypoglycemia.  Praised for improvement in glycemic control. Recommended to continue current regimen at this time. Discussed that clinical trials are showing ability to maintain normoglycemia with [facility], so there may often be readings <70, but this has not been found to be symptomatic. Recommend she stop monitoring with Libre, as low glucose alarms are not clinically necessary at this time. Patient verbalizes understanding.  Sending Mounjaro refill for 3 month supply. Requested lab work prior to next available appointment with PCP. Ordered A1c and scheduled lab work.   Hypertension w/ hx hypokalemia: Controlled; current treatment: triamterene/HCTZ 37.5/25 mg 1/2 tab daily, amlodipine 5 mg daily QAM; potassium 20 mEq daily . Reports leg cramps are significantly improved with potassium supplementation Recommended to continue current regimen at this time. Refill sent on amlodipine.   Hyperlipidemia: Uncontrolled; current treatment: rosuvastatin 20 mg daily May be  benefit of weight loss, Mounjaro on patient's lipid profile. Lipid panel ordered for upcoming lab work.  Continue current regimen at this time.  Asthma/Allergies: Controlled right now; current treatment: montelukast 10 mg daily; fexofenadine 180 mg daily, hydroxyzine 25 mg PRN hives, triamcinolone 0.1% cream PRN hives, related to changing environments, stress from caring for her mother;  Previously recommended to continue current regimen at this time.   Insomnia/Anxiety/Neuropathy Controlled, but with high risk medications as patient ages; current regimen: zolpidem 12.5 mg QPM, amitriptyline 25 mg QPM; started on gabapentin 300 mg PRN by neurology  Hx fluoxetine; hx diazepam, temazepam, trazodone, nortriptyline.  Previously recommended to continue current regimen at this time.  Migraines: Improved control; current regimen: topiramate 50 mg QAM; concurrent amitriptyline, Nurtec 75 mg PRN; onransetron 4 mg PRN nausea Hx Emgality - did not like giving injections Reduced topiramate to once daily due to mental fog.  Previously recommended to continue current regimen at this time along with neurology collaboration  IBS-D/Hemorrhoids: Improved; current regimen: dicyclomine 20 mg BID PRN-; hydrocortisone 2.5% rectal - though has not needed since last ligation; omeprazole 40 mg daily, amitriptyline for anticholinergic benefit; follows w/ Dr. Marius Ditch;  Previously recommended to continue current regimen as above  Patient Goals/Self-Care Activities Over the next 90 days, patient will:  - take medications as prescribed check glucose 2-3 times daily using CGM, document, and provide at future appointments engage in dietary modifications by reducing carbohydrate portion sizes  Follow Up Plan: Telephone follow up appointment with care management team member scheduled for: 6 weeks     Medication Assistance:  None required.  Patient affirms current coverage meets needs.  Follow Up:  Patient agrees to  Care Plan and Follow-up.  Plan: Telephone follow up appointment with care management team member scheduled for:  6 weeks  Catie Darnelle Maffucci, PharmD, [place], [place] Clinical Pharmacist [employer] [facility] [employer] [phone]

## 2020-10-24 NOTE — Patient Instructions (Signed)
Visit Information   Goals Addressed               This Visit's Progress     Patient Stated     Medication Monitoring (pt-stated)        Patient Goals/Self-Care Activities Over the next 90 days, patient will:  - take medications as prescribed check glucose 2-3 times daily using CGM, document, and provide at future appointments engage in dietary modifications by reducing carbohydrate portion sizes        Patient verbalizes understanding of instructions provided today and agrees to view in [place].  Plan: Telephone follow up appointment with care management team member scheduled for:  6 weeks  Catie Darnelle Maffucci, PharmD, [place], [place] Clinical Pharmacist [employer] [facility] [employer] [phone]

## 2020-11-07 DIAGNOSIS — M9902 Segmental and somatic dysfunction of thoracic region: Secondary | ICD-10-CM | POA: Diagnosis not present

## 2020-11-07 DIAGNOSIS — M546 Pain in thoracic spine: Secondary | ICD-10-CM | POA: Diagnosis not present

## 2020-11-07 DIAGNOSIS — M9901 Segmental and somatic dysfunction of cervical region: Secondary | ICD-10-CM | POA: Diagnosis not present

## 2020-11-07 DIAGNOSIS — M5412 Radiculopathy, cervical region: Secondary | ICD-10-CM | POA: Diagnosis not present

## 2020-11-08 ENCOUNTER — Other Ambulatory Visit: Payer: Self-pay

## 2020-11-08 ENCOUNTER — Other Ambulatory Visit (INDEPENDENT_AMBULATORY_CARE_PROVIDER_SITE_OTHER): Payer: BC Managed Care – PPO

## 2020-11-08 DIAGNOSIS — E1165 Type 2 diabetes mellitus with hyperglycemia: Secondary | ICD-10-CM

## 2020-11-08 DIAGNOSIS — E785 Hyperlipidemia, unspecified: Secondary | ICD-10-CM | POA: Diagnosis not present

## 2020-11-08 DIAGNOSIS — I10 Essential (primary) hypertension: Secondary | ICD-10-CM

## 2020-11-08 LAB — LIPID PANEL
Cholesterol: 123 mg/dL (ref 0–200)
HDL: 49.8 mg/dL (ref >39.00)
LDL Cholesterol: 42 mg/dL (ref 0–99)
NonHDL: 73.24
Total CHOL/HDL Ratio: 2
Triglycerides: 154 mg/dL — ABNORMAL HIGH (ref 0.0–149.0)
VLDL: 30.8 mg/dL (ref 0.0–40.0)

## 2020-11-08 LAB — BASIC METABOLIC PANEL
BUN: 17 mg/dL (ref 6–23)
CO2: 31 mEq/L (ref 19–32)
Calcium: 9.7 mg/dL (ref 8.4–10.5)
Chloride: 100 mEq/L (ref 96–112)
Creatinine, Ser: 1.35 mg/dL — ABNORMAL HIGH (ref 0.40–1.20)
GFR: 42.58 mL/min — ABNORMAL LOW (ref >60.00)
Glucose, Bld: 87 mg/dL (ref 70–99)
Potassium: 3 mEq/L — ABNORMAL LOW (ref 3.5–5.1)
Sodium: 141 mEq/L (ref 135–145)

## 2020-11-08 LAB — HEMOGLOBIN A1C: Hgb A1c MFr Bld: 6.1 % (ref 4.6–6.5)

## 2020-11-08 LAB — LDL CHOLESTEROL, DIRECT: Direct LDL: 48 mg/dL

## 2020-11-13 DIAGNOSIS — M9901 Segmental and somatic dysfunction of cervical region: Secondary | ICD-10-CM | POA: Diagnosis not present

## 2020-11-13 DIAGNOSIS — M9902 Segmental and somatic dysfunction of thoracic region: Secondary | ICD-10-CM | POA: Diagnosis not present

## 2020-11-13 DIAGNOSIS — M5412 Radiculopathy, cervical region: Secondary | ICD-10-CM | POA: Diagnosis not present

## 2020-11-13 DIAGNOSIS — M546 Pain in thoracic spine: Secondary | ICD-10-CM | POA: Diagnosis not present

## 2020-11-14 ENCOUNTER — Telehealth: Payer: Self-pay

## 2020-11-14 DIAGNOSIS — M7501 Adhesive capsulitis of right shoulder: Secondary | ICD-10-CM | POA: Diagnosis not present

## 2020-11-14 DIAGNOSIS — M25311 Other instability, right shoulder: Secondary | ICD-10-CM | POA: Diagnosis not present

## 2020-11-14 DIAGNOSIS — G8929 Other chronic pain: Secondary | ICD-10-CM | POA: Diagnosis not present

## 2020-11-14 DIAGNOSIS — S46001A Unspecified injury of muscle(s) and tendon(s) of the rotator cuff of right shoulder, initial encounter: Secondary | ICD-10-CM | POA: Diagnosis not present

## 2020-11-14 DIAGNOSIS — M25511 Pain in right shoulder: Secondary | ICD-10-CM | POA: Diagnosis not present

## 2020-11-14 NOTE — Chronic Care Management (AMB) (Signed)
Care Management   Note  11/14/2020 Name: Kristin [name] MRN: [id number] DOB: 12-02-1960  Kristin [name] is a 60 y.o. year old female who is a primary care patient of McLean-Scocuzza, Pasty Spillers, MD and is actively engaged with the care management team. I reached out to Kristin [name] by phone today to assist with re-scheduling a follow up visit with the Pharmacist  Follow up plan: Unsuccessful telephone outreach attempt made. A HIPAA compliant phone message was left for the patient providing contact information and requesting a return call.  The care management team will reach out to the patient again over the next 7 days.  If patient returns call to provider office, please advise to call Embedded Care Management Care Guide Penne Lash  at [phone]  Penne Lash, RMA Care Guide, Embedded Care Coordination [facility]  [place], [place] [address] Direct Dial: [phone] [email or web address] Website: [email or web address]

## 2020-11-15 ENCOUNTER — Other Ambulatory Visit: Payer: Self-pay | Admitting: Orthopedic Surgery

## 2020-11-15 ENCOUNTER — Other Ambulatory Visit (HOSPITAL_BASED_OUTPATIENT_CLINIC_OR_DEPARTMENT_OTHER): Payer: Self-pay | Admitting: Orthopedic Surgery

## 2020-11-15 DIAGNOSIS — S46001A Unspecified injury of muscle(s) and tendon(s) of the rotator cuff of right shoulder, initial encounter: Secondary | ICD-10-CM

## 2020-11-15 DIAGNOSIS — G8929 Other chronic pain: Secondary | ICD-10-CM

## 2020-11-15 DIAGNOSIS — M25311 Other instability, right shoulder: Secondary | ICD-10-CM

## 2020-11-15 DIAGNOSIS — M7501 Adhesive capsulitis of right shoulder: Secondary | ICD-10-CM

## 2020-11-16 ENCOUNTER — Telehealth: Payer: Self-pay | Admitting: Internal Medicine

## 2020-11-16 NOTE — Telephone Encounter (Signed)
Patient is having a MRI this weekend. She is asking to prescribe something to keep her calm while having the MRI.

## 2020-11-18 ENCOUNTER — Ambulatory Visit
Admission: RE | Admit: 2020-11-18 | Discharge: 2020-11-18 | Disposition: A | Discharge status: Home / Self Care | Payer: BC Managed Care – PPO | Source: Ambulatory Visit | Attending: Orthopedic Surgery | Admitting: Orthopedic Surgery

## 2020-11-18 DIAGNOSIS — G8929 Other chronic pain: Secondary | ICD-10-CM

## 2020-11-18 DIAGNOSIS — M25511 Pain in right shoulder: Secondary | ICD-10-CM | POA: Insufficient documentation

## 2020-11-18 DIAGNOSIS — M25311 Other instability, right shoulder: Secondary | ICD-10-CM | POA: Diagnosis not present

## 2020-11-18 DIAGNOSIS — S46001A Unspecified injury of muscle(s) and tendon(s) of the rotator cuff of right shoulder, initial encounter: Secondary | ICD-10-CM

## 2020-11-18 DIAGNOSIS — M7501 Adhesive capsulitis of right shoulder: Secondary | ICD-10-CM | POA: Diagnosis not present

## 2020-11-19 NOTE — Telephone Encounter (Signed)
Just seeing this message my apologies  ortho should have ordered her something for anxiety since they ordered MRI  Was she able to do MRI?

## 2020-11-19 NOTE — Telephone Encounter (Signed)
MRI done yesterday, 11/18/20 but not yet resulted in the chart

## 2020-11-22 DIAGNOSIS — M7501 Adhesive capsulitis of right shoulder: Secondary | ICD-10-CM | POA: Diagnosis not present

## 2020-11-26 ENCOUNTER — Other Ambulatory Visit: Payer: Self-pay | Admitting: Internal Medicine

## 2020-11-26 DIAGNOSIS — N179 Acute kidney failure, unspecified: Secondary | ICD-10-CM

## 2020-11-26 DIAGNOSIS — I10 Essential (primary) hypertension: Secondary | ICD-10-CM

## 2020-11-26 MED ORDER — AMLODIPINE BESYLATE 10 MG PO TABS: 10.0000 mg | ORAL_TABLET | Freq: Every day | ORAL | 3 refills | Status: DC

## 2020-11-26 NOTE — Addendum Note (Signed)
Addended by: Orland Mustard on: 11/26/2020 04:04 AM   Modules accepted: Orders

## 2020-11-27 NOTE — Chronic Care Management (AMB) (Signed)
Care Management   Note  11/27/2020 Name: Kristin [name] MRN: [id number] DOB: 1960-10-01  [name] is a 60 y.o. year old female who is a primary care patient of McLean-Scocuzza, Pasty Spillers, MD and is actively engaged with the care management team. I reached out to Evalee Mutton Halliwell by phone today to assist with re-scheduling a follow up visit with the Pharmacist  Follow up plan: Telephone appointment with care management team member scheduled for:02/07/2020  Penne Lash, RMA Care Guide, Embedded Care Coordination [facility]  [place], [place] [address] Direct Dial: [phone] [email or web address] Website: [email or web address]

## 2020-12-03 MED ORDER — NURTEC 75 MG PO TBDP: 75.0000 mg | ORAL_TABLET | Freq: Every day | ORAL | 5 refills | Status: DC | PRN

## 2020-12-04 ENCOUNTER — Other Ambulatory Visit (INDEPENDENT_AMBULATORY_CARE_PROVIDER_SITE_OTHER): Payer: BC Managed Care – PPO

## 2020-12-04 ENCOUNTER — Other Ambulatory Visit: Payer: Self-pay

## 2020-12-04 DIAGNOSIS — N179 Acute kidney failure, unspecified: Secondary | ICD-10-CM

## 2020-12-04 DIAGNOSIS — I10 Essential (primary) hypertension: Secondary | ICD-10-CM | POA: Diagnosis not present

## 2020-12-04 LAB — BASIC METABOLIC PANEL
BUN: 19 mg/dL (ref 6–23)
CO2: 27 mEq/L (ref 19–32)
Calcium: 9.1 mg/dL (ref 8.4–10.5)
Chloride: 105 mEq/L (ref 96–112)
Creatinine, Ser: 0.97 mg/dL (ref 0.40–1.20)
GFR: 63.28 mL/min (ref >60.00)
Glucose, Bld: 84 mg/dL (ref 70–99)
Potassium: 4.4 mEq/L (ref 3.5–5.1)
Sodium: 138 mEq/L (ref 135–145)

## 2020-12-05 ENCOUNTER — Telehealth: Payer: BC Managed Care – PPO

## 2020-12-05 LAB — SODIUM, URINE, RANDOM: Sodium, Ur: 20 mmol/L — ABNORMAL LOW (ref 28–272)

## 2020-12-05 LAB — MICROALBUMIN / CREATININE URINE RATIO
Creatinine, Urine: 40 mg/dL (ref 20–275)
Microalb Creat Ratio: 25 mcg/mg creat (ref <30)
Microalb, Ur: 1 mg/dL

## 2020-12-20 DIAGNOSIS — M7501 Adhesive capsulitis of right shoulder: Secondary | ICD-10-CM | POA: Diagnosis not present

## 2020-12-25 ENCOUNTER — Other Ambulatory Visit: Payer: Self-pay

## 2020-12-25 ENCOUNTER — Ambulatory Visit (INDEPENDENT_AMBULATORY_CARE_PROVIDER_SITE_OTHER): Payer: BC Managed Care – PPO | Admitting: Internal Medicine

## 2020-12-25 ENCOUNTER — Encounter: Payer: Self-pay | Admitting: Internal Medicine

## 2020-12-25 VITALS — BP 128/74 | HR 68 | Temp 96.9°F | Ht 62.5 in | Wt 147.9 lb

## 2020-12-25 DIAGNOSIS — Z23 Encounter for immunization: Secondary | ICD-10-CM

## 2020-12-25 DIAGNOSIS — Z1231 Encounter for screening mammogram for malignant neoplasm of breast: Secondary | ICD-10-CM

## 2020-12-25 DIAGNOSIS — Z1329 Encounter for screening for other suspected endocrine disorder: Secondary | ICD-10-CM

## 2020-12-25 DIAGNOSIS — M778 Other enthesopathies, not elsewhere classified: Secondary | ICD-10-CM

## 2020-12-25 DIAGNOSIS — Z832 Family history of diseases of the blood and blood-forming organs and certain disorders involving the immune mechanism: Secondary | ICD-10-CM

## 2020-12-25 DIAGNOSIS — R0602 Shortness of breath: Secondary | ICD-10-CM

## 2020-12-25 DIAGNOSIS — E1159 Type 2 diabetes mellitus with other circulatory complications: Secondary | ICD-10-CM

## 2020-12-25 DIAGNOSIS — M7551 Bursitis of right shoulder: Secondary | ICD-10-CM | POA: Insufficient documentation

## 2020-12-25 DIAGNOSIS — Z0000 Encounter for general adult medical examination without abnormal findings: Secondary | ICD-10-CM

## 2020-12-25 DIAGNOSIS — I152 Hypertension secondary to endocrine disorders: Secondary | ICD-10-CM

## 2020-12-25 DIAGNOSIS — R7303 Prediabetes: Secondary | ICD-10-CM | POA: Diagnosis not present

## 2020-12-25 DIAGNOSIS — I10 Essential (primary) hypertension: Secondary | ICD-10-CM

## 2020-12-25 DIAGNOSIS — Z1389 Encounter for screening for other disorder: Secondary | ICD-10-CM

## 2020-12-25 DIAGNOSIS — G4700 Insomnia, unspecified: Secondary | ICD-10-CM

## 2020-12-25 DIAGNOSIS — J309 Allergic rhinitis, unspecified: Secondary | ICD-10-CM

## 2020-12-25 DIAGNOSIS — E559 Vitamin D deficiency, unspecified: Secondary | ICD-10-CM

## 2020-12-25 DIAGNOSIS — J9801 Acute bronchospasm: Secondary | ICD-10-CM

## 2020-12-25 HISTORY — DX: Bursitis of right shoulder: M75.51

## 2020-12-25 HISTORY — DX: Other enthesopathies, not elsewhere classified: M77.8

## 2020-12-25 MED ORDER — ALBUTEROL SULFATE HFA 108 (90 BASE) MCG/ACT IN AERS: 1.0000 | INHALATION_SPRAY | Freq: Four times a day (QID) | RESPIRATORY_TRACT | 11 refills | Status: DC | PRN

## 2020-12-25 MED ORDER — ZOLPIDEM TARTRATE ER 12.5 MG PO TBCR: 12.5000 mg | EXTENDED_RELEASE_TABLET | Freq: Every evening | ORAL | 5 refills | Status: DC | PRN

## 2020-12-25 MED ORDER — SHINGRIX 50 MCG/0.5ML IM SUSR: 0.5000 mL | Freq: Once | INTRAMUSCULAR | 1 refills | Status: AC

## 2020-12-25 MED ORDER — MONTELUKAST SODIUM 10 MG PO TABS: 10.0000 mg | ORAL_TABLET | Freq: Every day | ORAL | 3 refills | Status: DC

## 2020-12-25 NOTE — Patient Instructions (Addendum)
1-2 weeks before next labs 05/2021 stop potassium pills and check labs  Consider moderna 2 weeks before cruise    Zoster Vaccine, Recombinant injection What is this medication? ZOSTER VACCINE (ZOS ter vak SEEN) is a vaccine used to reduce the risk of getting shingles. This vaccine is not used to treat shingles or nerve pain from shingles. This medicine may be used for other purposes; ask your health care provider or pharmacist if you have questions. COMMON BRAND NAME(S): [facility] What should I tell my care team before I take this medication? They need to know if you have any of these conditions: cancer immune system problems an unusual or allergic reaction to Zoster vaccine, other medications, foods, dyes, or preservatives pregnant or trying to get pregnant breast-feeding How should I use this medication? This vaccine is injected into a muscle. It is given by a health care provider. A copy of Vaccine Information Statements will be given before each vaccination. Be sure to read this information carefully each time. This sheet may change often. Talk to your health care provider about the use of this vaccine in children. This vaccine is not approved for use in children. Overdosage: If you think you have taken too much of this medicine contact a poison control center or emergency room at once. NOTE: This medicine is only for you. Do not share this medicine with others. What if I miss a dose? Keep appointments for follow-up (booster) doses. It is important not to miss your dose. Call your health care provider if you are unable to keep an appointment. What may interact with this medication? medicines that suppress your immune system medicines to treat cancer steroid medicines like prednisone or cortisone This list may not describe all possible interactions. Give your health care provider a list of all the medicines, herbs, non-prescription drugs, or dietary supplements you use. Also tell them if  you smoke, drink alcohol, or use illegal drugs. Some items may interact with your medicine. What should I watch for while using this medication? Visit your health care provider regularly. This vaccine, like all vaccines, may not fully protect everyone. What side effects may I notice from receiving this medication? Side effects that you should report to your doctor or health care professional as soon as possible: allergic reactions (skin rash, itching or hives; swelling of the face, lips, or tongue) trouble breathing Side effects that usually do not require medical attention (report these to your doctor or health care professional if they continue or are bothersome): chills headache fever nausea pain, redness, or irritation at site where injected tiredness vomiting This list may not describe all possible side effects. Call your doctor for medical advice about side effects. You may report side effects to FDA at 1-800-FDA-1088. Where should I keep my medication? This vaccine is only given by a health care provider. It will not be stored at home. NOTE: This sheet is a summary. It may not cover all possible information. If you have questions about this medicine, talk to your doctor, pharmacist, or health care provider.  2022 Elsevier/Gold Standard (2020-09-25 00:00:00)  Pneumococcal Conjugate Vaccine (Prevnar 13) Suspension for Injection What is this medication? PNEUMOCOCCAL VACCINE (NEU mo KOK al vak SEEN) is a vaccine used to prevent pneumococcus bacterial infections. These bacteria can cause serious infections like pneumonia, meningitis, and blood infections. This vaccine will lower your chance of getting pneumonia. If you do get pneumonia, it can make your symptoms milder and your illness shorter. This vaccine will not  treat an infection and will not cause infection. This vaccine is recommended for infants and young children, adults with certain medical conditions, and adults 34 years or  older. This medicine may be used for other purposes; ask your health care provider or pharmacist if you have questions. COMMON BRAND NAME(S): Prevnar, Prevnar 13 What should I tell my care team before I take this medication? They need to know if you have any of these conditions: bleeding problems fever immune system problems an unusual or allergic reaction to pneumococcal vaccine, diphtheria toxoid, other vaccines, latex, other medicines, foods, dyes, or preservatives pregnant or trying to get pregnant breast-feeding How should I use this medication? This vaccine is for injection into a muscle. It is given by a health care professional. A copy of Vaccine Information Statements will be given before each vaccination. Read this sheet carefully each time. The sheet may change frequently. Talk to your pediatrician regarding the use of this medicine in children. While this drug may be prescribed for children as young as 39 weeks old for selected conditions, precautions do apply. Overdosage: If you think you have taken too much of this medicine contact a poison control center or emergency room at once. NOTE: This medicine is only for you. Do not share this medicine with others. What if I miss a dose? It is important not to miss your dose. Call your doctor or health care professional if you are unable to keep an appointment. What may interact with this medication? medicines for cancer chemotherapy medicines that suppress your immune function steroid medicines like prednisone or cortisone This list may not describe all possible interactions. Give your health care provider a list of all the medicines, herbs, non-prescription drugs, or dietary supplements you use. Also tell them if you smoke, drink alcohol, or use illegal drugs. Some items may interact with your medicine. What should I watch for while using this medication? Mild fever and pain should go away in 3 days or less. Report any unusual  symptoms to your doctor or health care professional. What side effects may I notice from receiving this medication? Side effects that you should report to your doctor or health care professional as soon as possible: allergic reactions like skin rash, itching or hives, swelling of the face, lips, or tongue breathing problems confused fast or irregular heartbeat fever over 102 degrees F seizures unusual bleeding or bruising unusual muscle weakness Side effects that usually do not require medical attention (report to your doctor or health care professional if they continue or are bothersome): aches and pains diarrhea fever of 102 degrees F or less headache irritable loss of appetite pain, tender at site where injected trouble sleeping This list may not describe all possible side effects. Call your doctor for medical advice about side effects. You may report side effects to FDA at 1-800-FDA-1088. Where should I keep my medication? This does not apply. This vaccine is given in a clinic, pharmacy, doctor's office, or other health care setting and will not be stored at home. NOTE: This sheet is a summary. It may not cover all possible information. If you have questions about this medicine, talk to your doctor, pharmacist, or health care provider.  2022 Elsevier/Gold Standard (2013-10-13 00:00:00)

## 2020-12-25 NOTE — Progress Notes (Signed)
Chief Complaint  Patient presents with   Follow-up   Annual doing well 1. A1c improved and htn improved A1c 6.1 11/08/20 htn controlled on noravasc 10 mg qd  2. Fh Factor V leiden will check for this     Review of Systems  Constitutional:  Negative for weight loss.  HENT:  Negative for hearing loss.   Eyes:  Negative for blurred vision.  Respiratory:  Negative for shortness of breath.   Cardiovascular:  Negative for chest pain.  Gastrointestinal:  Negative for abdominal pain and blood in stool.  Genitourinary:  Negative for dysuria.  Musculoskeletal:  Positive for joint pain. Negative for falls.  Skin:  Negative for rash.  Neurological:  Negative for headaches.  Psychiatric/Behavioral:  Negative for depression.   Past Medical History:  Diagnosis Date   Allergy    Arthritis    Basal cell carcinoma    nose, left cheek, right upper lip, right posterior shoulder most recent 6 years ago on left cheek   Bleeding    Chicken pox    Complication of anesthesia    blood pressure elevated after gastric sleeve surgery in recovery, pt does not want mask over face   COVID-19    03/01/20 had MAB 03/03/20 and felt better next day 03/04/20   Depression    Diabetes mellitus without complication ([place])    pre-diabetic currently, was diabetic prior to gastric sleeve surgery    Disorder of right rotator cuff    10/03/19 MRI right shoulder abnormal Dr. Maurine Cane   Fall    08/2020   Fatty liver    Gout    Hematuria 10/15/2018   History of shingles    Hyperlipidemia    Hypertension    no problems since losing weight after gastric sleeve surgery   IBS (irritable bowel syndrome)    Insomnia    Migraine    Obesity    Osteopenia    Past Surgical History:  Procedure Laterality Date   ABDOMINAL HYSTERECTOMY     no h/o abnormal pap still has 1 ovary left   ANKLE ARTHROSCOPY Left 05/29/2016   Procedure: ANKLE ARTHROSCOPY;  Surgeon: [name], DPM;  Location: [place];  Service: Podiatry;   Laterality: Left;   APPENDECTOMY     basal cell removed     [place] PROPOFOL N/A 01/31/2019   Procedure: COLONOSCOPY WITH PROPOFOL;  Surgeon: Lin Landsman, MD;  Location: [facility] ENDOSCOPY;  Service: Gastroenterology;  Laterality: N/A;   cyst removed from wrist     ESOPHAGOGASTRODUODENOSCOPY (EGD) WITH PROPOFOL N/A 01/31/2019   Procedure: ESOPHAGOGASTRODUODENOSCOPY (EGD) WITH PROPOFOL;  Surgeon: Lin Landsman, MD;  Location: [place];  Service: Gastroenterology;  Laterality: N/A;   FLEXIBLE SIGMOIDOSCOPY N/A 05/21/2020   Procedure: FLEXIBLE SIGMOIDOSCOPY;  Surgeon: Lin Landsman, MD;  Location[facility] END[facility]COPY;  Service: Gastroenterology;  Laterality: N/A;   FOOT SURGERY     left foot 11/2016 Dr. Laymond Purser    left elbow surgery     07/2017 Dr Leanor Kail epicondylitis    MOHS SURGERY     x 2 face nose and left cheek    OSTEOCHONDROMA EXCISION Left 05/29/2016   Procedure: OSTEOCHONDRAL DRILLING TALUS;  Surgeon: [name], DPM;  Location: [place];  Service: Podiatry;  Laterality: Left;   PLANTAR FASCIA RELEASE Left 05/29/2016   Procedure: ENDOSCOPIC PLANTAR FASCIOTOMY;  Surgeon: [name], DPM;  Location: [place];  Service: Podiatry;  Laterality: Left;   ROTATOR CUFF REPAIR     ? right likely shoulder   STOMACH SURGERY  2017   gastric sleeve surgery   TENNIS ELBOW RELEASE/NIRSCHEL PROCEDURE Left 08/05/2017   Procedure: TENNIS ELBOW RELEASE/NIRSCHEL PROCEDURE;  Surgeon: Leanor Kail, MD;  Location: [facility] ORS;  Service: Orthopedics;  Laterality: Left;   TUBAL LIGATION     wedge resection of ovary  1982   Family History  Problem Relation Age of Onset   Heart disease Mother    Breast cancer Mother 58       metastatic, now hip, spine, and leg    Arthritis Mother    Hyperlipidemia Mother    Cancer Mother        spinal surgery. breast dx'ed age 76 now metastatic 60 as of 07/13/18   Cystic  fibrosis Mother        carrier    Heart disease Father    Lung cancer Father        smoker   Hyperlipidemia Father    Hypertension Father    Diabetes Sister    Stroke Sister    Other Sister        covid +   Diabetes Sister    Arthritis Maternal Grandmother    Diabetes Maternal Grandfather    Hyperlipidemia Maternal Grandfather    Diabetes Maternal Aunt    Diabetes Maternal Aunt    Diabetes Maternal Uncle    Sudden death Cousin    Cystic fibrosis Other        great niece    Factor V Leiden deficiency Other        uncle and cousins and m GF (all maternal)   Colon cancer Neg Hx    Ovarian cancer Neg Hx    Social History   Socioeconomic History   Marital status: Married    Spouse name: Not on file   Number of children: 3   Years of education: Not on file   Highest education level: Some college, no degree  Occupational History   Not on file  Tobacco Use   Smoking status: Never   Smokeless tobacco: Never  Vaping Use   Vaping Use: Never used  Substance and Sexual Activity   Alcohol use: Never   Drug use: Never   Sexual activity: Yes    Partners: Male    Birth control/protection: Surgical  Other Topics Concern   Not on file  Social History Narrative   Married x 37 years as of 10/2017    Kids 3 boys youngest is Eddie Dibbles   Write childrens books    Retired from [employer] 01/2017    Enjoys traveling    Right handed   Caffeine: quit 2009   Social Determinants of Health   Financial Resource Strain: Low Risk    Difficulty of Paying Living Expenses: Not hard at all  Food Insecurity: Not on file  Transportation Needs: Not on file  Physical Activity: Not on file  Stress: Not on file  Social Connections: Not on file  Intimate Partner Violence: Not on file   Current Meds  Medication Sig   amitriptyline (ELAVIL) 25 MG tablet TAKE 1 TABLET (25 MG TOTAL) BY MOUTH AT BEDTIME. INCREASE TO $RemoveBef'50MG'[id number]$  AFTER 2 WEEKS IF NEEDED   amLODipine (NORVASC) 10 MG tablet Take 1 tablet (10 mg  total) by mouth daily. D/c 5 mg norvasc d/c maxzide 1/2 pill 37.5-25   Cholecalciferol (DIALYVITE VITAMIN D 5000 PO)  Take 5,000 Units by mouth every other day.   dicyclomine (BENTYL) 20 MG tablet Take 20 mg by mouth 2 (two) times daily as needed for spasms.   fexofenadine (ALLEGRA) 180 MG tablet TAKE 1 TABLET (180 MG TOTAL) BY MOUTH DAILY AS NEEDED FOR ALLERGIES OR RHINITIS.   gabapentin (NEURONTIN) 300 MG capsule Take 1 capsule (300 mg total) by mouth 3 (three) times daily as needed.   hydrOXYzine (ATARAX/VISTARIL) 25 MG tablet Take 25 mg by mouth 2 (two) times daily as needed.   Multiple Vitamin (MULTIVITAMIN) tablet Take 1 tablet by mouth daily.   omeprazole (PRILOSEC) 40 MG capsule TAKE 1 CAPSULE (40 MG TOTAL) BY MOUTH DAILY BEFORE SUPPER.   ondansetron (ZOFRAN-ODT) 4 MG disintegrating tablet Take 1 tablet (4 mg total) by mouth every 8 (eight) hours as needed. for nausea   potassium chloride (MICRO-K) 10 MEQ CR capsule Take 2 capsules (20 mEq total) by mouth daily.   Rimegepant Sulfate (NURTEC) 75 MG TBDP Take 75 mg by mouth daily as needed (take for abortive therapy of migraine, no more than 1 tablet in 24 hours or 10 per month).   rosuvastatin (CRESTOR) 20 MG tablet Take 1 tablet (20 mg total) by mouth daily.   tirzepatide [facility]) 5 MG/0.5ML Pen Inject 5 mg into the skin once a week.   topiramate (TOPAMAX) 50 MG tablet Take 1 tablet (50 mg total) by mouth 2 (two) times daily.   triamcinolone (KENALOG) 0.1 % Apply 1 application topically 2 (two) times daily. Prn itching hives on body not face   Zoster Vaccine Adjuvanted [facility]) injection Inject 0.5 mLs into the muscle once for 1 dose. 2nd dose 2 to before 6 months   [DISCONTINUED] albuterol (VENTOLIN HFA) 108 (90 Base) MCG/ACT inhaler Inhale 1-2 puffs into the lungs every 6 (six) hours as needed for wheezing or shortness of breath.   [DISCONTINUED] montelukast (SINGULAIR) 10 MG tablet Take 1 tablet (10 mg total) by mouth at bedtime.    [DISCONTINUED] zolpidem (AMBIEN CR) 12.5 MG CR tablet TAKE 1 TABLET (12.5 MG TOTAL) BY MOUTH AT BEDTIME AS NEEDED.   Allergies  Allergen Reactions   Tetracyclines & Related Swelling and Rash    FACE SWELL, RASH ON CHEST   Amoxicillin    Alcohol Rash    ABDOMINAL CRAMPS, drinking alcohol   Azithromycin Rash   Metoprolol Diarrhea   Recent Results (from the past 2160 hour(s))  ANA     Status: Abnormal   Collection Time: 10/15/20  1:53 PM  Result Value Ref Range   Anti Nuclear Antibody (ANA) Positive (A) Negative  Hepatitis B surface antibody,qualitative     Status: None   Collection Time: 10/15/20  1:53 PM  Result Value Ref Range   Hep B Surface Ab, Qual Reactive     Comment:               Non Reactive: Inconsistent with immunity,                             less than 10 mIU/mL               Reactive:     Consistent with immunity,                             greater than 9.9 mIU/mL   Anti-smooth muscle antibody, IgG     Status: None  Collection Time: 10/15/20  1:53 PM  Result Value Ref Range   Smooth Muscle Ab 7 0 - 19 Units    Comment:                  Negative                     0 - 19                  Weak positive               20 - 30                  Moderate to strong positive     >30  Actin Antibodies are found in 52-85% of patients with  autoimmune hepatitis or chronic active hepatitis and  in 22% of patients with primary biliary cirrhosis.   Hepatitis C antibody     Status: None   Collection Time: 10/15/20  1:53 PM  Result Value Ref Range   Hep C Virus Ab <0.1 0.0 - 0.9 s/co ratio    Comment:                                   Negative:     < 0.8                              Indeterminate: 0.8 - 0.9                                   Positive:     > 0.9  HCV antibody alone does not differentiate between  previous resolved infection and active infection.  The CDC and current clinical guidelines recommend  that a positive HCV antibody result be followed up  with  an HCV RNA test to support the diagnosis of  acute HCV infection. [facility] offers Hepatitis C  Virus (HCV) RNA, Diagnosis, NAA (694466) and  Hepatitis C Virus (HCV) Antibody with reflex to  Quantitative Real-time PCR (144050).   Alpha-1-antitrypsin     Status: None   Collection Time: 10/15/20  1:53 PM  Result Value Ref Range   A-1 Antitrypsin 151 101 - 187 mg/dL  Hepatitis B surface antigen     Status: None   Collection Time: 10/15/20  1:53 PM  Result Value Ref Range   Hepatitis B Surface Ag Negative Negative  Hepatitis B core antibody, total     Status: None   Collection Time: 10/15/20  1:53 PM  Result Value Ref Range   Hep B Core Total Ab Negative Negative  Ceruloplasmin     Status: None   Collection Time: 10/15/20  1:56 PM  Result Value Ref Range   Ceruloplasmin 30.4 19.0 - 39.0 mg/dL  Hepatic function panel     Status: Abnormal   Collection Time: 10/15/20  1:56 PM  Result Value Ref Range   Total Protein 7.4 6.0 - 8.5 g/dL   Albumin 4.7 3.8 - 4.9 g/dL   Bilirubin Total 0.3 0.0 - 1.2 mg/dL   Bilirubin, Direct 7.30 0.00 - 0.40 mg/dL   Alkaline Phosphatase 96 44 - 121 IU/L   AST 30 0 - 40 IU/L   ALT 40 (H) 0 - 32 IU/L  Mitochondrial antibodies  Status: None   Collection Time: 10/15/20  1:56 PM  Result Value Ref Range   Mitochondrial Ab <20.0 0.0 - 20.0 Units    Comment:                                 Negative    0.0 - 20.0                                 Equivocal  20.1 - 24.9                                 Positive         >24.9 Mitochondrial (M2) Antibodies are found in 90-96% of patients with primary biliary cirrhosis.   Ferritin     Status: None   Collection Time: 10/15/20  1:56 PM  Result Value Ref Range   Ferritin 133 15 - 150 ng/mL  Direct LDL     Status: None   Collection Time: 11/08/20  9:01 AM  Result Value Ref Range   Direct LDL 48.0 mg/dL    Comment: Optimal:  <899 mg/dLNear or Above Optimal:  100-129 mg/dLBorderline High:  130-159 mg/dLHigh:  160-189  mg/dLVery High:  >190 mg/dL  Lipid Profile     Status: Abnormal   Collection Time: 11/08/20  9:01 AM  Result Value Ref Range   Cholesterol 123 0 - 200 mg/dL    Comment: ATP III Classification       Desirable:  < 200 mg/dL               Borderline High:  200 - 239 mg/dL          High:  > = 354 mg/dL   Triglycerides 113.2 (H) 0.0 - 149.0 mg/dL    Comment: Normal:  <059 mg/dLBorderline High:  150 - 199 mg/dL   HDL 95.29 >15.95 mg/dL   VLDL 38.1 0.0 - 99.7 mg/dL   LDL Cholesterol 42 0 - 99 mg/dL   Total CHOL/HDL Ratio 2     Comment:                Men          Women1/2 Average Risk     3.4          3.3Average Risk          5.0          4.42X Average Risk          9.6          7.13X Average Risk          15.0          11.0                       NonHDL 73.24     Comment: NOTE:  Non-HDL goal should be 30 mg/dL higher than patient's LDL goal (i.e. LDL goal of < 70 mg/dL, would have non-HDL goal of < 100 mg/dL)  Basic Metabolic Panel (BMET)     Status: Abnormal   Collection Time: 11/08/20  9:01 AM  Result Value Ref Range   Sodium 141 135 - 145 mEq/L   Potassium 3.0 (L) 3.5 - 5.1 mEq/L   Chloride 100 96 - 112 mEq/L  CO2 31 19 - 32 mEq/L   Glucose, Bld 87 70 - 99 mg/dL   BUN 17 6 - 23 mg/dL   Creatinine, Ser 1.35 (H) 0.40 - 1.20 mg/dL   GFR 42.58 (L) >60.00 mL/min    Comment: Calculated using the CKD-EPI Creatinine Equation (2021)   Calcium 9.7 8.4 - 10.5 mg/dL  HgB A1c     Status: None   Collection Time: 11/08/20  9:01 AM  Result Value Ref Range   Hgb A1c MFr Bld 6.1 4.6 - 6.5 %    Comment: Glycemic Control Guidelines for People with Diabetes:Non Diabetic:  <6%Goal of Therapy: <7%Additional Action Suggested:  >1%   Basic Metabolic Panel (BMET)     Status: None   Collection Time: 12/04/20 10:26 AM  Result Value Ref Range   Sodium 138 135 - 145 mEq/L   Potassium 4.4 3.5 - 5.1 mEq/L   Chloride 105 96 - 112 mEq/L   CO2 27 19 - 32 mEq/L   Glucose, Bld 84 70 - 99 mg/dL   BUN 19 6 - 23 mg/dL    Creatinine, Ser 0.97 0.40 - 1.20 mg/dL   GFR 63.28 >60.00 mL/min    Comment: Calculated using the CKD-EPI Creatinine Equation (2021)   Calcium 9.1 8.4 - 10.5 mg/dL  Sodium, urine, random     Status: Abnormal   Collection Time: 12/04/20 10:26 AM  Result Value Ref Range   Sodium, Ur 20 (L) 28 - 272 mmol/L  Microalbumin / creatinine urine ratio     Status: None   Collection Time: 12/04/20 10:26 AM  Result Value Ref Range   Creatinine, Urine 40 20 - 275 mg/dL   Microalb, Ur 1.0 mg/dL    Comment: Reference Range Not established    Microalb Creat Ratio 25 <30 mcg/mg creat    Comment: . The ADA defines abnormalities in albumin excretion as follows: [place] Kitchen Albuminuria Category        Result (mcg/mg creatinine) . Normal to Mildly increased   <30 Moderately increased         30-299  Severely increased           > OR = 300 . The ADA recommends that at least two of three specimens collected within a 3-6 month period be abnormal before considering a patient to be within a diagnostic category.    Objective  Body mass index is 26.62 kg/m. Wt Readings from Last 3 Encounters:  12/25/20 147 lb 14.4 oz (67.1 kg)  10/24/20 155 lb (70.3 kg)  09/10/20 166 lb (75.3 kg)   Temp Readings from Last 3 Encounters:  12/25/20 (!) 96.9 F (36.1 C) (Temporal)  09/12/20 97.9 F (36.6 C)  09/10/20 97.9 F (36.6 C) (Oral)   BP Readings from Last 3 Encounters:  12/25/20 128/74  09/12/20 119/80  09/10/20 109/65   Pulse Readings from Last 3 Encounters:  12/25/20 68  09/12/20 82  09/10/20 82    Physical Exam Vitals and nursing note reviewed.  Constitutional:      Appearance: Normal appearance. She is well-developed and well-groomed.  HENT:     Head: Normocephalic and atraumatic.  Eyes:     Conjunctiva/sclera: Conjunctivae normal.     Pupils: Pupils are equal, round, and reactive to light.  Cardiovascular:     Rate and Rhythm: Normal rate and regular rhythm.     Heart sounds: Normal heart  sounds. No murmur heard. Pulmonary:     Effort: Pulmonary effort is normal.  Breath sounds: Normal breath sounds.  Abdominal:     General: Abdomen is flat. Bowel sounds are normal.     Tenderness: There is no abdominal tenderness.  Musculoskeletal:        General: No tenderness.  Skin:    General: Skin is warm and dry.  Neurological:     General: No focal deficit present.     Mental Status: She is alert and oriented to person, place, and time. Mental status is at baseline.     Cranial Nerves: Cranial nerves 2-12 are intact.     Gait: Gait is intact.  Psychiatric:        Attention and Perception: Attention and perception normal.        Mood and Affect: Mood and affect normal.        Speech: Speech normal.        Behavior: Behavior normal. Behavior is cooperative.        Thought Content: Thought content normal.        Cognition and Memory: Cognition and memory normal.        Judgment: Judgment normal.    Assessment  Plan  Annual physical exam See below  Fasting labs 05/2021  Prevnar given today   Bursitis of right shoulder Tendonitis of shoulder, right Fu [facility]   Prediabetes  Hypertension associated with diabetes ([place]) Controlled on norvasc 10 mg qd and mounjaro 5 mg week  Consider d/c k 10 mg bid   Insomnia, unspecified type - Plan: zolpidem (AMBIEN CR) 12.5 MG CR tablet  HM-See above Flu shot 09/18/20  Prevnar given today utd Tdap 01/08/13 Disc shingrix prev still not had  Consider prevnar and pna 23 vaccines in future covid vx had 4/4 moderna consider 4th dose  hep A vaccine 2/2 immune h/o fatty liver hep B immune with h/o fatty liver  Hep C neg  MMR immune   Never smoker, chew congratulated not doing either    mammo 07/05/19 b/l diag normal right axillary mass fat pad likely MSK pain/ttp -screening mammo in 1 year ordered call and sch 07/31/20 negative     Colonoscopy -obtained records 07/15/11 colonoscopy Dr. Candace Cruise nl repeat in 10 years  Colonoscopy  01/31/19 Dr. Marius Ditch IH prep fair hemmorhiods, tubular adenoma f/u in 5 years likely with GI   Pap s/p hysterectomy h.o fibroids ? Cervix intact has appt Dr. Enzo Bi 02/10/17.  -Appt went well 01/2017 ob/gyn f/u in 1 year -per note pap not needed she will f/u with Dr. Marcelline Mates now last seen 03/10/2018  pap 08/19/19 neg neg HPV   DEXA consider age 33    Dermatology sees [place] [facility] q6 months h/o BCC nose and left cheek s/p Mohs and h/o Aks   -seen 06/07/19 f/u 12/05/2019 Dr. Brendolyn Patty 03/26/21    05/2020 [place] [facility] neg retinopathy     Healthy diet and exercise rec.  Provider: Dr. Olivia Mackie McLean-[facility]oc[facility]

## 2020-12-25 NOTE — Addendum Note (Signed)
Addended by: Orland Mustard on: 12/25/2020 04:54 PM   Modules accepted: Orders

## 2020-12-26 ENCOUNTER — Telehealth: Payer: Self-pay | Admitting: Pharmacist

## 2020-12-26 NOTE — Telephone Encounter (Signed)
Received PA request for albuterol HFA. This medication should not require a PA, there probably is a preferred alternative NDC. Called [facility], spoke with Dominica, PharmD. She ran a different NDC and the medication is covered. PA cancelled.

## 2021-01-17 DIAGNOSIS — M7501 Adhesive capsulitis of right shoulder: Secondary | ICD-10-CM | POA: Diagnosis not present

## 2021-02-06 ENCOUNTER — Ambulatory Visit: Payer: BC Managed Care – PPO | Admitting: Pharmacist

## 2021-02-06 DIAGNOSIS — E785 Hyperlipidemia, unspecified: Secondary | ICD-10-CM

## 2021-02-06 DIAGNOSIS — E1165 Type 2 diabetes mellitus with hyperglycemia: Secondary | ICD-10-CM

## 2021-02-06 DIAGNOSIS — I10 Essential (primary) hypertension: Secondary | ICD-10-CM

## 2021-02-06 NOTE — Patient Instructions (Signed)
Visit Information  Following are the goals we discussed today:  Patient Goals/Self-Care Activities Over the next 90 days, patient will:  - take medications as prescribed check glucose 2-3 times daily using CGM, document, and provide at future appointments engage in dietary modifications by reducing carbohydrate portion sizes        Plan: Goals of care met. Closing CCM case   Catie Darnelle Maffucci, PharmD, [place], CPP Clinical Pharmacist [place] at [facility] [phone]     Please call the care guide team at [phone] if you need to cancel or reschedule your appointment.   Patient verbalizes understanding of instructions and care plan provided today and agrees to view in [place]. Active MyChart status confirmed with patient.

## 2021-02-06 NOTE — Chronic Care Management (AMB) (Signed)
Chronic Care Management CCM Pharmacy Note  02/06/2021 Name:  Kristin [name] MRN:  [id number] DOB:  [birth year]  Summary: - Tolerating current regimen well.   Recommendations/Changes made from today's visit: - Recommended to continue current regimen at this time - Discussed Shingrix vaccines  Subjective: Kristin [name] is an 61 y.o. year old female who is a primary patient of McLean-Scocuzza, Nino Glow, MD.  The CCM team was consulted for assistance with disease management and care coordination needs.    Engaged with patient by telephone for follow up visit for pharmacy case management and/or care coordination services.   Objective:  Medications Reviewed Today     Reviewed by De Hollingshead, RPH-CPP (Pharmacist) on 02/06/21 at 44  Med List Status: <None>   Medication Order Taking? Sig Documenting Provider Last Dose Status Informant  albuterol (VENTOLIN HFA) 108 (90 Base) MCG/ACT inhaler [id number]  Inhale 1-2 puffs into the lungs every 6 (six) hours as needed for wheezing or shortness of breath. McLean-Scocuzza, Nino Glow, MD  Active   amitriptyline (ELAVIL) 25 MG tablet [id number] Yes TAKE 1 TABLET (25 MG TOTAL) BY MOUTH AT BEDTIME. INCREASE TO $RemoveBef'50MG'[id number]$  AFTER 2 WEEKS IF NEEDED Vanga, Tally Due, MD Taking Active            Med Note Darnelle Maffucci, CLAUDIA GREENLEY Feb 06, 2021 10:51 AM) 25 mg daily  amLODipine (NORVASC) 10 MG tablet [id number] Yes Take 1 tablet (10 mg total) by mouth daily. D/c 5 mg norvasc d/c maxzide 1/2 pill 37.5-25 McLean-Scocuzza, Nino Glow, MD Taking Active   Cholecalciferol (DIALYVITE VITAMIN D 5000 PO) [id number] Yes Take 5,000 Units by mouth every other day. [provider] Taking Active   dicyclomine (BENTYL) 20 MG tablet [id number] Yes Take 20 mg by mouth 2 (two) times daily as needed for spasms. [provider] Taking Active   fexofenadine (ALLEGRA) 180 MG tablet [id number] Yes TAKE 1 TABLET (180 MG TOTAL) BY MOUTH DAILY AS NEEDED FOR  ALLERGIES OR RHINITIS. McLean-Scocuzza, Nino Glow, MD Taking Active   gabapentin (NEURONTIN) 300 MG capsule [id number] Yes Take 1 capsule (300 mg total) by mouth 3 (three) times daily as needed. Melvenia Beam, MD Taking Active            Med Note Darnelle Maffucci, RUBEN PYKA Feb 06, 2021 10:53 AM) QPM  hydrOXYzine (ATARAX/VISTARIL) 25 MG tablet [id number] Yes Take 25 mg by mouth 2 (two) times daily as needed. [provider] Taking Active   montelukast (SINGULAIR) 10 MG tablet [id number] Yes Take 1 tablet (10 mg total) by mouth at bedtime. McLean-Scocuzza, Nino Glow, MD Taking Active   Multiple Vitamin (MULTIVITAMIN) tablet [id number] Yes Take 1 tablet by mouth daily. [provider] Taking Active   omeprazole (PRILOSEC) 40 MG capsule [id number] Yes TAKE 1 CAPSULE (40 MG TOTAL) BY MOUTH DAILY BEFORE SUPPER. Lin Landsman, MD Taking Active   ondansetron (ZOFRAN-ODT) 4 MG disintegrating tablet [id number] No Take 1 tablet (4 mg total) by mouth every 8 (eight) hours as needed. for nausea  Patient not taking: Reported on 02/06/2021   McLean-Scocuzza, Nino Glow, MD Not Taking Active   potassium chloride (MICRO-K) 10 MEQ CR capsule [id number] Yes Take 2 capsules (20 mEq total) by mouth daily. McLean-Scocuzza, Nino Glow, MD Taking Active   Rimegepant Sulfate (NURTEC) 75 MG TBDP [id number] Yes Take 75 mg by mouth daily as needed (take for abortive therapy of migraine, no more than 1 tablet in 24 hours or  10 per month). Melvenia Beam, MD Taking Active   rosuvastatin (CRESTOR) 20 MG tablet [id number] Yes Take 1 tablet (20 mg total) by mouth daily. McLean-Scocuzza, Nino Glow, MD Taking Active   tirzepatide [facility]) 5 MG/0.5ML Pen [id number] Yes Inject 5 mg into the skin once a week. Leone Haven, MD Taking Active   topiramate (TOPAMAX) 50 MG tablet [id number] Yes Take 1 tablet (50 mg total) by mouth 2 (two) times daily. Debbora Presto, NP Taking Active            Med Note MICKELLE, GOUPIL  May 28, 2020  1:06 PM) Taking once daily QAM  triamcinolone (KENALOG) 0.1 % [id number] No Apply 1 application topically 2 (two) times daily. Prn itching hives on body not face  Patient not taking: Reported on 02/06/2021   McLean-Scocuzza, Nino Glow, MD Not Taking Active   zolpidem (AMBIEN CR) 12.5 MG CR tablet [id number] Yes Take 1 tablet (12.5 mg total) by mouth at bedtime as needed. McLean-Scocuzza, Nino Glow, MD Taking Active             Pertinent Labs:   Lab Results  Component Value Date   HGBA1C 6.1 11/08/2020   Lab Results  Component Value Date   CHOL 123 11/08/2020   HDL 49.80 11/08/2020   LDLCALC 42 11/08/2020   LDLDIRECT 48.0 11/08/2020   TRIG 154.0 (H) 11/08/2020   CHOLHDL 2 11/08/2020   Lab Results  Component Value Date   CREATININE 0.97 12/04/2020   BUN 19 12/04/2020   NA 138 12/04/2020   K 4.4 12/04/2020   CL 105 12/04/2020   CO2 27 12/04/2020    SDOH:  (Social Determinants of Health) assessments and interventions performed:  SDOH Interventions    Flowsheet Row Most Recent Value  SDOH Interventions   Financial Strain Interventions Intervention Not Indicated       CCM Care Plan  Review of patient past medical history, allergies, medications, health status, including review of consultants reports, laboratory and other test data, was performed as part of comprehensive evaluation and provision of chronic care management services.   Care Plan : PharmD - Medication Management  Updates made by De Hollingshead, RPH-CPP since 02/06/2021 12:00 AM  Completed 02/06/2021   Problem: Diabetes, IBS-D, HLD, Anxiety Resolved 02/06/2021     Long-Range Goal: Disease Progression Prevention Completed 02/06/2021  Start Date: 05/28/2020  Recent Progress: On track  Priority: High  Note:   Current Barriers:  Unable to achieve control of diabetes   Pharmacist Clinical Goal(s):  Over the next 90 days, patient will achieve control of diabetes as evidenced by A1c through  collaboration with PharmD and provider.   Interventions: 1:1 collaboration with McLean-Scocuzza, Nino Glow, MD regarding development and update of comprehensive plan of care as evidenced by provider attestation and co-signature Inter-disciplinary care team collaboration (see longitudinal plan of care) Comprehensive medication review performed; medication list updated in electronic medical record  Health Maintenance   Yearly diabetic eye exam: up to date Yearly diabetic foot exam: up to date Urine microalbumin: up to date Yearly influenza vaccination: up to date Td/Tdap vaccination: up to date Pneumonia vaccination: up to date COVID vaccinations: up to date Shingrix vaccinations: due - recommended to pursue. Encouraged to schedule nurse visit  Colonoscopy: up to date Mammogram: up to date   Diabetes: Controlled; current treatment: Mounjaro 5 mg weekly Current glucose readings: stopped use of Libre 2 given controlled glucoses  Recommended to continue current regimen at  this time.   Hypertension w/ hx hypokalemia: Controlled; current treatment: amlodipine 10 mg daily QAM; potassium 20 mEq daily Home blood pressure readings: not checking Recommended to continue current regimen at this time. Recommended periodic home blood pressure readings.   Hyperlipidemia: Controlled; current treatment: rosuvastatin 20 mg daily Current diet - decreasing red meat  Recommended to continue current regimen at this time.  Asthma/Allergies: Controlled right now; current treatment: montelukast 10 mg daily; fexofenadine 180 mg daily, hydroxyzine 25 mg PRN hives, triamcinolone 0.1% cream PRN hives, related to changing environments, stress from caring for her mother;  Previously recommended to continue current regimen at this time.   Insomnia/Anxiety/Neuropathy Controlled, but with high risk medications as patient ages; current regimen: zolpidem 12.5 mg QPM, amitriptyline 25 mg QPM; started on gabapentin  300 mg PRN by neurology  Hx fluoxetine; hx diazepam, temazepam, trazodone, nortriptyline.  Previously recommended to continue current regimen at this time.  Migraines: Controlled; current regimen: topiramate 50 mg QAM; concurrent amitriptyline, Nurtec 75 mg PRN; onransetron 4 mg PRN nausea Hx Emgality - did not like giving injections Reduced topiramate to once daily due to mental fog.  Previously recommended to continue current regimen at this time along with neurology collaboration  IBS-D/Hemorrhoids: Improved; current regimen: dicyclomine 20 mg BID PRN; omeprazole 40 mg daily, amitriptyline 25 mg QPM for anticholinergic benefit; follows w/ Dr. Marius Ditch;  Previously recommended to continue current regimen as above  Patient Goals/Self-Care Activities Over the next 90 days, patient will:  - take medications as prescribed check glucose 2-3 times daily using CGM, document, and provide at future appointments engage in dietary modifications by reducing carbohydrate portion sizes      Plan: Goals of care met. Closing CCM case  Catie Darnelle Maffucci, PharmD, [place], [place] Clinical Pharmacist [employer] [facility] [employer] [phone]

## 2021-02-07 ENCOUNTER — Ambulatory Visit (INDEPENDENT_AMBULATORY_CARE_PROVIDER_SITE_OTHER): Payer: BC Managed Care – PPO | Admitting: Family Medicine

## 2021-02-07 ENCOUNTER — Encounter: Payer: Self-pay | Admitting: Family Medicine

## 2021-02-07 VITALS — BP 116/68 | HR 97 | Ht 62.0 in | Wt 143.0 lb

## 2021-02-07 DIAGNOSIS — E0842 Diabetes mellitus due to underlying condition with diabetic polyneuropathy: Secondary | ICD-10-CM | POA: Diagnosis not present

## 2021-02-07 DIAGNOSIS — G43709 Chronic migraine without aura, not intractable, without status migrainosus: Secondary | ICD-10-CM | POA: Diagnosis not present

## 2021-02-07 NOTE — Patient Instructions (Addendum)
Below is our plan:  We will continue topiramate 50mg  twice daily. Continue Nurtec as needed. Continue gabapentin 300mg  up to three times daily. Try alpha lipoic acid over the counter. You could also try lidocaine patches and capsaicin patches over the counter.   Please make sure you are staying well hydrated. I recommend 50-60 ounces daily. Well balanced diet and regular exercise encouraged. Consistent sleep schedule with 6-8 hours recommended.   Please continue follow up with care team as directed.   Follow up with me in 1 year, sooner if needed   You may receive a survey regarding today's visit. I encourage you to leave honest feed back as I do use this information to improve patient care. Thank you for seeing me today!

## 2021-02-07 NOTE — Progress Notes (Signed)
PATIENT: Kristin [name] DOB: Mar 16, 1960  REASON FOR VISIT: follow up HISTORY FROM: patient  Chief Complaint  Patient presents with   Follow-up    Pt alone, rm 12. Pt states that she has noticed that on posterior right thigh she has noticed that there is numbness. She has noticed some weakness in hands as well.      HISTORY OF PRESENT ILLNESS: 02/07/21 ALL: Kristin [name] returns for follow up for migraines and diabetic polyneuropathy. She continues topiramate 50mg  BID and Nurtec as needed. Also on amitriptyline 25mg  at bedtime for sleep managed by psychiatry.   She feels that she is doing ok. Headaches are usually well managed. She did have a migraine that lasted about 2 days around Christmas and she contributes to stress. She thinks it was about 2-3 months since she had to use abortive meds.   Dr Kristin [name] started gabapentin 300mg  TID and advised she consider alpha lipoic acid as well as lidocaine and capsaicin OTC. She has only taken gabapentin at night due to sleepiness. She did not try OTC meds. She does continue to have numbness/tingling of both hands and feet. She feels that right hand may be a little weaker than left. Has trouble holding glass or coffee cups. Does well with plastic cups. She also reports new numbness of posterior right thigh. Worse when sitting. She has noticed for a couple of weeks. No lower extremity weakness. She does have intermittent low back pain.   She has lost 20 pounds since 07/2020, 50lbs over the past year. A1C now 6.2. She is taking Mounjaro and tolerating well.    08/07/2020 AA: She has very poor balance, she feels like she may keep going forward when stands, when she walks down the grocery isle she feels like she has to hold onto her husband. Pins and needles, cramping in the feet, she can be sitting, walking, in bed it is more hte cramps int he back of the legs and the arch of the foot, K+ pills helped with the cramps. Slowly progressive. Ongoing for over a  year.    HgbA1c 6/22 7.4  04/05/20 8.3, 03/2020 B12 383, 03/2020 tsh 2.49   Interval history 11/10/2017: MR of the brain showed frontal chronic sinusitis, discussed seeing ENT. Propranolol is helping, the headaches are not as bad. She has been tested in the past recently 3 years ago and no sleep apnea.  She is under more stress now. Her pulse is 60 cannot increase propranolol further. Discussed options, due to stress may start Prozac which is a migraine preventative and can help with stress. Zofran for dizziness or nausea. Also start Emgality.    Until this past week has been doing "great" with the migraines but this one has on ongoing going for days will give you a [place]ado Toradol l injection at that . 1Medications tried: propranolol, topiramate, prozac, nortriptyline, verapamil   REVIEW OF SYSTEMS: Out of a complete 14 system review of symptoms, the patient complains only of the following symptoms, headaches, anxiety, low back pain, numbness/tingling, insomnia and all other reviewed systems are negative.   ALLERGIES: Allergies  Allergen Reactions   Tetracyclines & Related Swelling and Rash    FACE SWELL, RASH ON CHEST   Amoxicillin    Alcohol Rash    ABDOMINAL CRAMPS, drinking alcohol   Azithromycin Rash   Metoprolol Diarrhea    HOME MEDICATIONS: Outpatient Medications Prior to Visit  Medication Sig Dispense Refill   albuterol (VENTOLIN HFA) 108 (90 Base) MCG/ACT  inhaler Inhale 1-2 puffs into the lungs every 6 (six) hours as needed for wheezing or shortness of breath. 18 g 11   amitriptyline (ELAVIL) 25 MG tablet TAKE 1 TABLET (25 MG TOTAL) BY MOUTH AT BEDTIME. INCREASE TO 50MG  AFTER 2 WEEKS IF NEEDED 180 tablet 0   amLODipine (NORVASC) 10 MG tablet Take 1 tablet (10 mg total) by mouth daily. D/c 5 mg norvasc d/c maxzide 1/2 pill 37.5-25 90 tablet 3   Cholecalciferol (DIALYVITE VITAMIN D 5000 PO) Take 5,000 Units by mouth every other day.     dicyclomine (BENTYL) 20 MG tablet Take 20 mg  by mouth 2 (two) times daily as needed for spasms.     fexofenadine (ALLEGRA) 180 MG tablet TAKE 1 TABLET (180 MG TOTAL) BY MOUTH DAILY AS NEEDED FOR ALLERGIES OR RHINITIS. 90 tablet 3   gabapentin (NEURONTIN) 300 MG capsule Take 1 capsule (300 mg total) by mouth 3 (three) times daily as needed. 90 capsule 11   hydrOXYzine (ATARAX/VISTARIL) 25 MG tablet Take 25 mg by mouth 2 (two) times daily as needed.     montelukast (SINGULAIR) 10 MG tablet Take 1 tablet (10 mg total) by mouth at bedtime. 90 tablet 3   Multiple Vitamin (MULTIVITAMIN) tablet Take 1 tablet by mouth daily.     omeprazole (PRILOSEC) 40 MG capsule TAKE 1 CAPSULE (40 MG TOTAL) BY MOUTH DAILY BEFORE SUPPER. 90 capsule 1   ondansetron (ZOFRAN-ODT) 4 MG disintegrating tablet Take 1 tablet (4 mg total) by mouth every 8 (eight) hours as needed. for nausea 30 tablet 5   potassium chloride (MICRO-K) 10 MEQ CR capsule Take 2 capsules (20 mEq total) by mouth daily. 180 capsule 3   Rimegepant Sulfate (NURTEC) 75 MG TBDP Take 75 mg by mouth daily as needed (take for abortive therapy of migraine, no more than 1 tablet in 24 hours or 10 per month). 10 tablet 5   rosuvastatin (CRESTOR) 20 MG tablet Take 1 tablet (20 mg total) by mouth daily. 90 tablet 3   tirzepatide (MOUNJARO) 5 MG/0.5ML Pen Inject 5 mg into the skin once a week. 6 mL 1   topiramate (TOPAMAX) 50 MG tablet Take 1 tablet (50 mg total) by mouth 2 (two) times daily. 90 tablet 3   triamcinolone (KENALOG) 0.1 % Apply 1 application topically 2 (two) times daily. Prn itching hives on body not face 454 g 0   zolpidem (AMBIEN CR) 12.5 MG CR tablet Take 1 tablet (12.5 mg total) by mouth at bedtime as needed. 30 tablet 5   No facility-administered medications prior to visit.    PAST MEDICAL HISTORY: Past Medical History:  Diagnosis Date   Allergy    Arthritis    Basal cell carcinoma    nose, left cheek, right upper lip, right posterior shoulder most recent 6 years ago on left cheek    Bleeding    Chicken pox    Complication of anesthesia    blood pressure elevated after gastric sleeve surgery in recovery, pt does not want mask over face   COVID-19    03/01/20 had MAB 03/03/20 and felt better next day 03/04/20   Depression    Diabetes mellitus without complication ([place])    pre-diabetic currently, was diabetic prior to gastric sleeve surgery    Disorder of right rotator cuff    10/03/19 MRI right shoulder abnormal Dr. Maurine Cane   Fall    08/2020   Fatty liver    Gout    Hematuria  10/15/2018   History of shingles    Hyperlipidemia    Hypertension    no problems since losing weight after gastric sleeve surgery   IBS (irritable bowel syndrome)    Insomnia    Migraine    Obesity    Osteopenia     PAST SURGICAL HISTORY: Past Surgical History:  Procedure Laterality Date   ABDOMINAL HYSTERECTOMY     no h/o abnormal pap still has 1 ovary left   ANKLE ARTHROSCOPY Left 05/29/2016   Procedure: ANKLE ARTHROSCOPY;  Surgeon: [name], DPM;  Location: [place];  Service: Podiatry;  Laterality: Left;   APPENDECTOMY     basal cell removed     [place] PROPOFOL N/A 01/31/2019   Procedure: COLONOSCOPY WITH PROPOFOL;  Surgeon: Lin Landsman, MD;  Location[facility] END[facility]COPY;  Service: Gastroenterology;  Laterality: N/A;   cyst removed from wrist     ESOPHAGOGASTRODUODENOSCOPY (EGD) WITH PROPOFOL N/A 01/31/2019   Procedure: ESOPHAGOGASTRODUODENOSCOPY (EGD) WITH PROPOFOL;  Surgeon: Lin Landsman, MD;  Location: [place];  Service: Gastroenterology;  Laterality: N/A;   FLEXIBLE SIGMOIDOSCOPY N/A 05/21/2020   Procedure: FLEXIBLE SIGMOIDOSCOPY;  Surgeon: Lin Landsman, MD;  Location: [facility] ENDOSCOPY;  Service: Gastroenterology;  Laterality: N/A;   FOOT SURGERY     left foot 11/2016 Dr. Laymond Purser    left elbow surgery     07/2017 Dr Leanor Kail epicondylitis    MOHS SURGERY     x 2 face nose  and left cheek    OSTEOCHONDROMA EXCISION Left 05/29/2016   Procedure: OSTEOCHONDRAL DRILLING TALUS;  Surgeon: [name], DPM;  Location: [place];  Service: Podiatry;  Laterality: Left;   PLANTAR FASCIA RELEASE Left 05/29/2016   Procedure: ENDOSCOPIC PLANTAR FASCIOTOMY;  Surgeon: [name], DPM;  Location: [place];  Service: Podiatry;  Laterality: Left;   ROTATOR CUFF REPAIR     ? right likely shoulder   STOMACH SURGERY  2017   gastric sleeve surgery   TENNIS ELBOW RELEASE/NIRSCHEL PROCEDURE Left 08/05/2017   Procedure: TENNIS ELBOW RELEASE/NIRSCHEL PROCEDURE;  Surgeon: Leanor Kail, MD;  Location: [facility];  Service: Orthopedics;  Laterality: Left;   TUBAL LIGATION     wedge resection of ovary  1982    FAMILY HISTORY: Family History  Problem Relation Age of Onset   Heart disease Mother    Breast cancer Mother 43       metastatic, now hip, spine, and leg    Arthritis Mother    Hyperlipidemia Mother    Cancer Mother        spinal surgery. breast dx'ed age 7 now metastatic 62 as of 07/13/18   Cystic fibrosis Mother        carrier    Heart disease Father    Lung cancer Father        smoker   Hyperlipidemia Father    Hypertension Father    Diabetes Sister    Stroke Sister    Other Sister        covid +   Diabetes Sister    Arthritis Maternal Grandmother    Diabetes Maternal Grandfather    Hyperlipidemia Maternal Grandfather    Diabetes Maternal Aunt    Diabetes Maternal Aunt    Diabetes Maternal Uncle    Sudden death Cousin    Cystic fibrosis Other        great niece  Factor V Leiden deficiency Other        uncle and cousins and m GF (all maternal)   Colon cancer Neg Hx    Ovarian cancer Neg Hx     SOCIAL HISTORY: Social History   Socioeconomic History   Marital status: Married    Spouse name: Not on file   Number of children: 3   Years of education: Not on file   Highest education level: Some college, no degree  Occupational History   Not on file   Tobacco Use   Smoking status: Never   Smokeless tobacco: Never  Vaping Use   Vaping Use: Never used  Substance and Sexual Activity   Alcohol use: Never   Drug use: Never   Sexual activity: Yes    Partners: Male    Birth control/protection: Surgical  Other Topics Concern   Not on file  Social History Narrative   Married x 37 years as of 10/2017    Kids 3 boys youngest is Eddie Dibbles   Write childrens books    Retired from [employer] 01/2017    Enjoys traveling    Right handed   Caffeine: quit 2009   Social Determinants of Health   Financial Resource Strain: Low Risk    Difficulty of Paying Living Expenses: Not hard at all  Food Insecurity: Not on file  Transportation Needs: Not on file  Physical Activity: Not on file  Stress: Not on file  Social Connections: Not on file  Intimate Partner Violence: Not on file      PHYSICAL EXAM  Vitals:   02/07/21 1410  BP: 116/68  Pulse: 97  Weight: 143 lb (64.9 kg)  Height: 5\' 2"  (1.575 m)    Body mass index is 26.16 kg/m.  Generalized: Well developed, in no acute distress  Cardiology: normal rate and rhythm, no murmur noted Respiratory: Clear to auscultation bilaterally  Neurological examination  Mentation: Alert oriented to time, place, history taking. Follows all commands speech and language fluent Cranial nerve II-XII: Pupils were equal round reactive to light. Extraocular movements were full, visual field were full on confrontational test. Facial sensation and strength were normal. Head turning and shoulder shrug  were normal and symmetric. Motor: The motor testing reveals 5 over 5 strength of all 4 extremities. Grip strength is normal. Good symmetric motor tone is noted throughout.  Sensory: Sensory testing is intact to soft touch, pinprick and vibration on all 4 extremities with exception of decreased vibratory sensation of left lateral malleolus (prior ankle surgery) No evidence of extinction is noted.  Coordination:  Cerebellar testing reveals good finger-nose-finger and heel-to-shin bilaterally.  Gait and station: Gait is normal.   DIAGNOSTIC DATA (LABS, IMAGING, TESTING) - I reviewed patient records, labs, notes, testing and imaging myself where available.  No flowsheet data found.   Lab Results  Component Value Date   WBC 8.9 05/03/2020   HGB 13.6 05/03/2020   HCT 40.7 05/03/2020   MCV 85.7 05/03/2020   PLT 323.0 05/03/2020      Component Value Date/Time   NA 138 12/04/2020 1026   NA 141 05/05/2019 0838   NA 139 10/24/2012 1218   K 4.4 12/04/2020 1026   K 3.9 10/24/2012 1218   CL 105 12/04/2020 1026   CL 103 10/24/2012 1218   CO2 27 12/04/2020 1026   CO2 29 10/24/2012 1218   GLUCOSE 84 12/04/2020 1026   GLUCOSE 103 (H) 10/24/2012 1218   BUN 19 12/04/2020 1026   BUN 11  05/05/2019 0838   BUN 12 10/24/2012 1218   CREATININE 0.97 12/04/2020 1026   CREATININE 0.67 10/24/2012 1218   CALCIUM 9.1 12/04/2020 1026   CALCIUM 9.0 10/24/2012 1218   PROT 7.4 10/15/2020 1356   PROT 7.4 10/24/2012 1218   ALBUMIN 4.7 10/15/2020 1356   ALBUMIN 3.2 (L) 10/24/2012 1218   AST 30 10/15/2020 1356   AST 14 (L) 10/24/2012 1218   ALT 40 (H) 10/15/2020 1356   ALT 22 10/24/2012 1218   ALKPHOS 96 10/15/2020 1356   ALKPHOS 99 10/24/2012 1218   BILITOT 0.3 10/15/2020 1356   BILITOT 0.2 10/24/2012 1218   GFRNONAA 96 05/05/2019 0838   GFRNONAA >60 10/24/2012 1218   GFRAA 110 05/05/2019 0838   GFRAA >60 10/24/2012 1218   Lab Results  Component Value Date   CHOL 123 11/08/2020   HDL 49.80 11/08/2020   LDLCALC 42 11/08/2020   LDLDIRECT 48.0 11/08/2020   TRIG 154.0 (H) 11/08/2020   CHOLHDL 2 11/08/2020   Lab Results  Component Value Date   HGBA1C 6.1 11/08/2020   Lab Results  Component Value Date   VITAMINB12 383 04/05/2020   Lab Results  Component Value Date   TSH 2.49 04/05/2020       ASSESSMENT AND PLAN 61 y.o. year old female  has a past medical history of Allergy, Arthritis,  Basal cell carcinoma, Bleeding, Chicken pox, Complication of anesthesia, COVID-19, Depression, Diabetes mellitus without complication ([place]), Disorder of right rotator cuff, Fall, Fatty liver, Gout, Hematuria (10/15/2018), History of shingles, Hyperlipidemia, Hypertension, IBS (irritable bowel syndrome), Insomnia, Migraine, Obesity, and Osteopenia. here with     ICD-10-CM   1. Diabetic polyneuropathy associated with diabetes mellitus due to underlying condition (HCC)  E08.42     2. Chronic migraine w/o aura, not intractable, w/o stat migr  G43.709        Cathy reports that headaches are well managed. We will continue topiramate 50mg  BID for prevention and Nurtec as needed for abortive therapy. She will continue gabapentin 300mg  up to three times daily for DM neuropathy. Alpha lipoic acid, lidocaine and capsaicin could also provide relief if needed. I have asked her to monitor numbness of posterior thigh and follow up with PCP for any worsening. My consider NCS/EMG if needed but would not change current treatment plan at this time. Consider PT if not improving. She will work on adequate hydration, well balanced diet and regular exercise. She will return for follow up in 1 year. She verbalizes understanding and agreement with this plan.    No orders of the defined types were placed in this encounter.    No orders of the defined types were placed in this encounter.     Debbora Presto, FNP-C 02/07/2021, 2:52 PM [facility] [address], [place], [place] [address] [phone]

## 2021-03-08 ENCOUNTER — Other Ambulatory Visit: Payer: Self-pay | Admitting: Gastroenterology

## 2021-03-13 ENCOUNTER — Encounter: Payer: Self-pay | Admitting: Internal Medicine

## 2021-03-13 ENCOUNTER — Ambulatory Visit (INDEPENDENT_AMBULATORY_CARE_PROVIDER_SITE_OTHER): Payer: BC Managed Care – PPO

## 2021-03-13 ENCOUNTER — Ambulatory Visit (INDEPENDENT_AMBULATORY_CARE_PROVIDER_SITE_OTHER): Payer: BC Managed Care – PPO | Admitting: Internal Medicine

## 2021-03-13 ENCOUNTER — Other Ambulatory Visit: Payer: Self-pay

## 2021-03-13 VITALS — BP 110/70 | HR 78 | Temp 97.8°F | Ht 62.0 in | Wt 138.6 lb

## 2021-03-13 DIAGNOSIS — M5441 Lumbago with sciatica, right side: Secondary | ICD-10-CM | POA: Diagnosis not present

## 2021-03-13 DIAGNOSIS — M25551 Pain in right hip: Secondary | ICD-10-CM

## 2021-03-13 DIAGNOSIS — Z23 Encounter for immunization: Secondary | ICD-10-CM | POA: Diagnosis not present

## 2021-03-13 DIAGNOSIS — M5136 Other intervertebral disc degeneration, lumbar region: Secondary | ICD-10-CM | POA: Diagnosis not present

## 2021-03-13 MED ORDER — SHINGRIX 50 MCG/0.5ML IM SUSR: 0.5000 mL | Freq: Once | INTRAMUSCULAR | 1 refills | Status: DC

## 2021-03-13 MED ORDER — SHINGRIX 50 MCG/0.5ML IM SUSR: 0.5000 mL | Freq: Once | INTRAMUSCULAR | 1 refills | Status: AC

## 2021-03-13 MED ORDER — TRAMADOL HCL 50 MG PO TABS: 50.0000 mg | ORAL_TABLET | Freq: Two times a day (BID) | ORAL | 0 refills | Status: AC | PRN

## 2021-03-13 NOTE — Progress Notes (Signed)
Chief Complaint  Patient presents with   Hip Pain   F/u  1. Right hip and low back pain 3/10 worse since 02/2021 no trauma driving to [facility] made worse ho 2011 MRI disc deg in 2011 and glut medius tendonitis declines pt and steroids today sitting worse tried otc tylenol and rub on w/o relief having numbness in right tigh worsening back pain    Review of Systems  Constitutional:  Negative for weight loss.  HENT:  Negative for hearing loss.   Eyes:  Negative for blurred vision.  Respiratory:  Negative for shortness of breath.   Cardiovascular:  Negative for chest pain.  Gastrointestinal:  Negative for abdominal pain and blood in stool.  Genitourinary:  Negative for dysuria.  Musculoskeletal:  Positive for back pain and joint pain. Negative for falls.  Skin:  Negative for rash.  Neurological:  Negative for headaches.  Psychiatric/Behavioral:  Negative for depression.   Past Medical History:  Diagnosis Date   Allergy    Arthritis    Basal cell carcinoma    nose, left cheek, right upper lip, right posterior shoulder most recent 6 years ago on left cheek   Bleeding    Chicken pox    Complication of anesthesia    blood pressure elevated after gastric sleeve surgery in recovery, pt does not want mask over face   COVID-19    03/01/20 had MAB 03/03/20 and felt better next day 03/04/20   Depression    Diabetes mellitus without complication (HCC)    pre-diabetic currently, was diabetic prior to gastric sleeve surgery    Disorder of right rotator cuff    10/03/19 MRI right shoulder abnormal Dr. Zettie Cooley   Fall    08/2020   Fatty liver    Gout    Hematuria 10/15/2018   History of shingles    Hyperlipidemia    Hypertension    no problems since losing weight after gastric sleeve surgery   IBS (irritable bowel syndrome)    Insomnia    Migraine    Obesity    Osteopenia    Past Surgical History:  Procedure Laterality Date   ABDOMINAL HYSTERECTOMY     no h/o abnormal pap still has 1 ovary  left   ANKLE ARTHROSCOPY Left 05/29/2016   Procedure: ANKLE ARTHROSCOPY;  Surgeon: Felecia Shelling, DPM;  Location: [facility] OR;  Service: Podiatry;  Laterality: Left;   APPENDECTOMY     basal cell removed     CESAREAN SECTION N/A    1985 & 1995   CHOLECYSTECTOMY     COLONOSCOPY WITH PROPOFOL N/A 01/31/2019   Procedure: COLONOSCOPY WITH PROPOFOL;  Surgeon: Toney Reil, MD;  Location: [facility] ENDOSCOPY;  Service: Gastroenterology;  Laterality: N/A;   cyst removed from wrist     ESOPHAGOGASTRODUODENOSCOPY (EGD) WITH PROPOFOL N/A 01/31/2019   Procedure: ESOPHAGOGASTRODUODENOSCOPY (EGD) WITH PROPOFOL;  Surgeon: Toney Reil, MD;  Location: [facility] ENDOSCOPY;  Service: Gastroenterology;  Laterality: N/A;   FLEXIBLE SIGMOIDOSCOPY N/A 05/21/2020   Procedure: FLEXIBLE SIGMOIDOSCOPY;  Surgeon: Toney Reil, MD;  Location: [facility] ENDOSCOPY;  Service: Gastroenterology;  Laterality: N/A;   FOOT SURGERY     left foot 11/2016 Dr. Vinnie Level    left elbow surgery     07/2017 Dr Erin Sons epicondylitis    MOHS SURGERY     x 2 face nose and left cheek    OSTEOCHONDROMA EXCISION Left 05/29/2016   Procedure: OSTEOCHONDRAL DRILLING TALUS;  Surgeon: Felecia Shelling, DPM;  Location: [place];  Service: Podiatry;  Laterality: Left;   PLANTAR FASCIA RELEASE Left 05/29/2016   Procedure: ENDOSCOPIC PLANTAR FASCIOTOMY;  Surgeon: [name], DPM;  Location: [place];  Service: Podiatry;  Laterality: Left;   ROTATOR CUFF REPAIR     ? right likely shoulder   STOMACH SURGERY  2017   gastric sleeve surgery   TENNIS ELBOW RELEASE/NIRSCHEL PROCEDURE Left 08/05/2017   Procedure: TENNIS ELBOW RELEASE/NIRSCHEL PROCEDURE;  Surgeon: Leanor Kail, MD;  Location: [facility] ORS;  Service: Orthopedics;  Laterality: Left;   TUBAL LIGATION     wedge resection of ovary  1982   Family History  Problem Relation Age of Onset   Heart disease Mother    Breast cancer Mother 24       metastatic, now hip, spine, and leg     Arthritis Mother    Hyperlipidemia Mother    Cancer Mother        spinal surgery. breast dx'ed age 67 now metastatic 30 as of 07/13/18   Cystic fibrosis Mother        carrier    Heart disease Father    Lung cancer Father        smoker   Hyperlipidemia Father    Hypertension Father    Diabetes Sister    Stroke Sister    Other Sister        covid +   Diabetes Sister    Arthritis Maternal Grandmother    Diabetes Maternal Grandfather    Hyperlipidemia Maternal Grandfather    Diabetes Maternal Aunt    Diabetes Maternal Aunt    Diabetes Maternal Uncle    Sudden death Cousin    Cystic fibrosis Other        great niece    Factor V Leiden deficiency Other        uncle and cousins and m GF (all maternal)   Colon cancer Neg Hx    Ovarian cancer Neg Hx    Social History   Socioeconomic History   Marital status: Married    Spouse name: Not on file   Number of children: 3   Years of education: Not on file   Highest education level: Some college, no degree  Occupational History   Not on file  Tobacco Use   Smoking status: Never   Smokeless tobacco: Never  Vaping Use   Vaping Use: Never used  Substance and Sexual Activity   Alcohol use: Never   Drug use: Never   Sexual activity: Yes    Partners: Male    Birth control/protection: Surgical  Other Topics Concern   Not on file  Social History Narrative   Married x 37 years as of 10/2017    Kids 3 boys youngest is Eddie Dibbles   Write childrens books    Retired from [employer] 01/2017    Enjoys traveling    Right handed   Caffeine: quit 2009   Social Determinants of Health   Financial Resource Strain: Low Risk    Difficulty of Paying Living Expenses: Not hard at all  Food Insecurity: Not on file  Transportation Needs: Not on file  Physical Activity: Not on file  Stress: Not on file  Social Connections: Not on file  Intimate Partner Violence: Not on file   Current Meds  Medication Sig   amitriptyline (ELAVIL) 25 MG tablet TAKE  1 TABLET (25 MG TOTAL) BY MOUTH AT BEDTIME. INCREASE TO $RemoveBef'50MG'tXEjiOBtbU$  AFTER 2 WEEKS IF NEEDED   amLODipine (NORVASC)  10 MG tablet Take 1 tablet (10 mg total) by mouth daily. D/c 5 mg norvasc d/c maxzide 1/2 pill 37.5-25   fexofenadine (ALLEGRA) 180 MG tablet TAKE 1 TABLET (180 MG TOTAL) BY MOUTH DAILY AS NEEDED FOR ALLERGIES OR RHINITIS.   gabapentin (NEURONTIN) 300 MG capsule Take 1 capsule (300 mg total) by mouth 3 (three) times daily as needed.   montelukast (SINGULAIR) 10 MG tablet Take 1 tablet (10 mg total) by mouth at bedtime.   omeprazole (PRILOSEC) 40 MG capsule TAKE 1 CAPSULE (40 MG TOTAL) BY MOUTH DAILY BEFORE SUPPER.   ondansetron (ZOFRAN-ODT) 4 MG disintegrating tablet Take 1 tablet (4 mg total) by mouth every 8 (eight) hours as needed. for nausea   potassium chloride (MICRO-K) 10 MEQ CR capsule Take 2 capsules (20 mEq total) by mouth daily.   Rimegepant Sulfate (NURTEC) 75 MG TBDP Take 75 mg by mouth daily as needed (take for abortive therapy of migraine, no more than 1 tablet in 24 hours or 10 per month).   rosuvastatin (CRESTOR) 20 MG tablet Take 1 tablet (20 mg total) by mouth daily.   tirzepatide [facility]) 5 MG/0.5ML Pen Inject 5 mg into the skin once a week.   topiramate (TOPAMAX) 50 MG tablet Take 1 tablet (50 mg total) by mouth 2 (two) times daily.   traMADol (ULTRAM) 50 MG tablet Take 1 tablet (50 mg total) by mouth every 12 (twelve) hours as needed for up to 10 days.   zolpidem (AMBIEN CR) 12.5 MG CR tablet Take 1 tablet (12.5 mg total) by mouth at bedtime as needed.   [DISCONTINUED] Zoster Vaccine Adjuvanted [facility]) injection Inject 0.5 mLs into the muscle once for 1 dose.   Allergies  Allergen Reactions   Tetracyclines & Related Swelling and Rash    FACE SWELL, RASH ON CHEST   Amoxicillin    Alcohol Rash    ABDOMINAL CRAMPS, drinking alcohol   Azithromycin Rash   Metoprolol Diarrhea   No results found for this or any previous visit (from the past 2160  hour(s)). Objective  Body mass index is 25.35 kg/m. Wt Readings from Last 3 Encounters:  03/13/21 138 lb 9.6 oz (62.9 kg)  02/07/21 143 lb (64.9 kg)  12/25/20 147 lb 14.4 oz (67.1 kg)   Temp Readings from Last 3 Encounters:  03/13/21 97.8 F (36.6 C) (Oral)  12/25/20 (!) 96.9 F (36.1 C) (Temporal)  09/12/20 97.9 F (36.6 C)   BP Readings from Last 3 Encounters:  03/13/21 110/70  02/07/21 116/68  12/25/20 128/74   Pulse Readings from Last 3 Encounters:  03/13/21 78  02/07/21 97  12/25/20 68    Physical Exam Vitals and nursing note reviewed.  Constitutional:      Appearance: Normal appearance. She is well-developed and well-groomed.  HENT:     Head: Normocephalic and atraumatic.  Eyes:     Conjunctiva/sclera: Conjunctivae normal.     Pupils: Pupils are equal, round, and reactive to light.  Cardiovascular:     Rate and Rhythm: Normal rate and regular rhythm.     Heart sounds: Normal heart sounds. No murmur heard. Pulmonary:     Effort: Pulmonary effort is normal.     Breath sounds: Normal breath sounds.  Abdominal:     General: Abdomen is flat. Bowel sounds are normal.     Tenderness: There is no abdominal tenderness.  Musculoskeletal:     Lumbar back: Tenderness present. Negative right straight leg raise test and negative left straight leg raise  test.     Comments: Right low back pain  Neg st8 leg raise b/l  Right hip pain+ both mild to mod  Skin:    General: Skin is warm and dry.  Neurological:     General: No focal deficit present.     Mental Status: She is alert and oriented to person, place, and time. Mental status is at baseline.     Cranial Nerves: Cranial nerves 2-12 are intact.     Motor: Motor function is intact.     Coordination: Coordination is intact.     Gait: Gait is intact.  Psychiatric:        Attention and Perception: Attention and perception normal.        Mood and Affect: Mood and affect normal.        Speech: Speech normal.         Behavior: Behavior normal. Behavior is cooperative.        Thought Content: Thought content normal.        Cognition and Memory: Cognition and memory normal.        Judgment: Judgment normal.    Assessment  Plan  Acute right-sided low back pain with right-sided sciatica - Plan: DG Hip Unilat W OR W/O Pelvis 2-3 Views Right, DG Lumbar Spine Complete, Ambulatory referral to Orthopedic Surgery, traMADol (ULTRAM) 50 MG tablet  Right hip pain - Plan: DG Hip Unilat W OR W/O Pelvis 2-3 Views Right, DG Lumbar Spine Complete, Ambulatory referral to Orthopedic Surgery, traMADol (ULTRAM) 50 MG tablet  Declines steroid and PT  Refer back [facility]  Consider Mri right hip and low back   HM-See above Flu shot 09/18/20  Prevnar utd  utd Tdap 01/08/13 Rx shingrix today  Utd prevnar consider pna 22 covid vx had 4/4 moderna consider 4th dose  hep A vaccine 2/2 immune h/o fatty liver hep B immune with h/o fatty liver  Hep C neg  MMR immune   Never smoker, chew congratulated not doing either    mammo 07/05/19 b/l diag normal right axillary mass fat pad likely MSK pain/ttp -screening mammo in 1 year ordered call and sch 07/31/20 negative     Colonoscopy -obtained records 07/15/11 colonoscopy Dr. Candace Cruise nl repeat in 10 years  Colonoscopy 01/31/19 Dr. Marius Ditch IH prep fair hemmorhiods, tubular adenoma f/u in 5 years likely with GI   Pap s/p hysterectomy h.o fibroids ? Cervix intact has appt Dr. Enzo Bi 02/10/17.  -Appt went well 01/2017 ob/gyn f/u in 1 year -per note pap not needed she will f/u with Dr. Marcelline Mates now last seen 03/10/2018  pap 08/19/19 neg neg HPV   DEXA consider age 46    Dermatology sees [facility] q6 months h/o BCC nose and left cheek s/p Mohs and h/o Aks   -seen 06/07/19 f/u 12/05/2019 Dr. Brendolyn Patty 03/26/21 disc with them about bruising    06/04/2020 [facility] neg retinopathy    Healthy diet and exercise rec   Provider: Dr. Olivia Mackie McLean-Scocuzza-[facility]

## 2021-03-13 NOTE — Patient Instructions (Addendum)
01/17/2021 Office Visit [facility]   [address]   [place], [place] [address]   [place], [place], [place] [place]   [place], [place] [address]   [phone] (Work)   [phone] (Fax)    Consider shingrix vaccine 2nd dose 2 to no more than 6 months  Zoster Vaccine, Recombinant injection What is this medication? ZOSTER VACCINE (ZOS ter vak SEEN) is a vaccine used to reduce the risk of getting shingles. This vaccine is not used to treat shingles or nerve pain from shingles. This medicine may be used for other purposes; ask your health care provider or pharmacist if you have questions. COMMON BRAND NAME(S): [facility] What should I tell my care team before I take this medication? They need to know if you have any of these conditions: cancer immune system problems an unusual or allergic reaction to Zoster vaccine, other medications, foods, dyes, or preservatives pregnant or trying to get pregnant breast-feeding How should I use this medication? This vaccine is injected into a muscle. It is given by a health care provider. A copy of Vaccine Information Statements will be given before each vaccination. Be sure to read this information carefully each time. This sheet may change often. Talk to your health care provider about the use of this vaccine in children. This vaccine is not approved for use in children. Overdosage: If you think you have taken too much of this medicine contact a poison control center or emergency room at once. NOTE: This medicine is only for you. Do not share this medicine with others. What if I miss a dose? Keep appointments for follow-up (booster) doses. It is important not to miss your dose. Call your health care provider if you are unable to keep an appointment. What may interact with this medication? medicines that suppress your immune system medicines to treat cancer steroid medicines like prednisone or cortisone This  list may not describe all possible interactions. Give your health care provider a list of all the medicines, herbs, non-prescription drugs, or dietary supplements you use. Also tell them if you smoke, drink alcohol, or use illegal drugs. Some items may interact with your medicine. What should I watch for while using this medication? Visit your health care provider regularly. This vaccine, like all vaccines, may not fully protect everyone. What side effects may I notice from receiving this medication? Side effects that you should report to your doctor or health care professional as soon as possible: allergic reactions (skin rash, itching or hives; swelling of the face, lips, or tongue) trouble breathing Side effects that usually do not require medical attention (report these to your doctor or health care professional if they continue or are bothersome): chills headache fever nausea pain, redness, or irritation at site where injected tiredness vomiting This list may not describe all possible side effects. Call your doctor for medical advice about side effects. You may report side effects to FDA at 1-800-FDA-1088. Where should I keep my medication? This vaccine is only given by a health care provider. It will not be stored at home. NOTE: This sheet is a summary. It may not cover all possible information. If you have questions about this medicine, talk to your doctor, pharmacist, or health care provider.  2022 Elsevier/Gold Standard (2020-09-25 00:00:00)

## 2021-03-14 ENCOUNTER — Telehealth: Payer: Self-pay

## 2021-03-14 NOTE — Telephone Encounter (Signed)
PA started for Tramadol 50 mg tablets via covermymeds. PA has been sent for approval on 03/14/21 waiting for response.  KEY: [id number]

## 2021-03-20 DIAGNOSIS — M7918 Myalgia, other site: Secondary | ICD-10-CM | POA: Diagnosis not present

## 2021-03-20 DIAGNOSIS — M25551 Pain in right hip: Secondary | ICD-10-CM | POA: Diagnosis not present

## 2021-03-20 DIAGNOSIS — M5136 Other intervertebral disc degeneration, lumbar region: Secondary | ICD-10-CM | POA: Diagnosis not present

## 2021-03-20 DIAGNOSIS — M47816 Spondylosis without myelopathy or radiculopathy, lumbar region: Secondary | ICD-10-CM | POA: Diagnosis not present

## 2021-03-20 DIAGNOSIS — M1611 Unilateral primary osteoarthritis, right hip: Secondary | ICD-10-CM | POA: Diagnosis not present

## 2021-03-26 ENCOUNTER — Other Ambulatory Visit: Payer: Self-pay

## 2021-03-26 ENCOUNTER — Ambulatory Visit (INDEPENDENT_AMBULATORY_CARE_PROVIDER_SITE_OTHER): Payer: BC Managed Care – PPO | Admitting: Dermatology

## 2021-03-26 DIAGNOSIS — C44519 Basal cell carcinoma of skin of other part of trunk: Secondary | ICD-10-CM

## 2021-03-26 DIAGNOSIS — L719 Rosacea, unspecified: Secondary | ICD-10-CM

## 2021-03-26 DIAGNOSIS — L570 Actinic keratosis: Secondary | ICD-10-CM | POA: Diagnosis not present

## 2021-03-26 DIAGNOSIS — D692 Other nonthrombocytopenic purpura: Secondary | ICD-10-CM

## 2021-03-26 DIAGNOSIS — L820 Inflamed seborrheic keratosis: Secondary | ICD-10-CM

## 2021-03-26 DIAGNOSIS — Z85828 Personal history of other malignant neoplasm of skin: Secondary | ICD-10-CM

## 2021-03-26 DIAGNOSIS — L578 Other skin changes due to chronic exposure to nonionizing radiation: Secondary | ICD-10-CM

## 2021-03-26 DIAGNOSIS — L814 Other melanin hyperpigmentation: Secondary | ICD-10-CM

## 2021-03-26 DIAGNOSIS — L853 Xerosis cutis: Secondary | ICD-10-CM | POA: Diagnosis not present

## 2021-03-26 DIAGNOSIS — Z1283 Encounter for screening for malignant neoplasm of skin: Secondary | ICD-10-CM

## 2021-03-26 DIAGNOSIS — D045 Carcinoma in situ of skin of trunk: Secondary | ICD-10-CM | POA: Diagnosis not present

## 2021-03-26 DIAGNOSIS — D485 Neoplasm of uncertain behavior of skin: Secondary | ICD-10-CM

## 2021-03-26 DIAGNOSIS — C4491 Basal cell carcinoma of skin, unspecified: Secondary | ICD-10-CM

## 2021-03-26 DIAGNOSIS — L299 Pruritus, unspecified: Secondary | ICD-10-CM

## 2021-03-26 DIAGNOSIS — L821 Other seborrheic keratosis: Secondary | ICD-10-CM

## 2021-03-26 DIAGNOSIS — D229 Melanocytic nevi, unspecified: Secondary | ICD-10-CM

## 2021-03-26 DIAGNOSIS — C4492 Squamous cell carcinoma of skin, unspecified: Secondary | ICD-10-CM

## 2021-03-26 DIAGNOSIS — D1800 Hemangioma unspecified site: Secondary | ICD-10-CM

## 2021-03-26 HISTORY — DX: Squamous cell carcinoma of skin, unspecified: C44.92

## 2021-03-26 HISTORY — DX: Basal cell carcinoma of skin, unspecified: C44.91

## 2021-03-26 MED ORDER — METRONIDAZOLE 0.75 % EX CREA
TOPICAL_CREAM | CUTANEOUS | 11 refills | Status: DC
Start: 2021-03-26 — End: 2021-10-08

## 2021-03-26 NOTE — Progress Notes (Signed)
Follow-Up Visit   Subjective  Kristin [name] is a 61 y.o. female who presents for the following: Annual Exam.  The patient presents for Total-Body Skin Exam (TBSE) for skin cancer screening and mole check.  The patient has spots, moles and lesions to be evaluated, some may be new or changing. She has a few spots to check on the chest and back. She has a history of BCCs. She has rosacea of the face, and uses metronidazole 0.75% cream prn.   The following portions of the chart were reviewed this encounter and updated as appropriate:       Review of Systems:  No other skin or systemic complaints except as noted in HPI or Assessment and Plan.  Objective  Well appearing patient in no apparent distress; mood and affect are within normal limits.  A full examination was performed including scalp, head, eyes, ears, nose, lips, neck, chest, axillae, abdomen, back, buttocks, bilateral upper extremities, bilateral lower extremities, hands, feet, fingers, toes, fingernails, and toenails. All findings within normal limits unless otherwise noted below.  face Mid face erythema with telangiectasias   R upper forehead x 4, chest x 3, R hand dorsum x 1 (8) Pink scaly macules  Right mid sternum 7.0 mm pink scaly flat papule with telangiectasia       Left Chest 8.0 x 6.0 mm pink brown slightly pearly patch       Left Popliteal Pink flat papule 8.0 mm  trunk; extremities; scalp Xerotic scaly patches.     Assessment & Plan  Skin cancer screening performed today.  Actinic Damage - chronic, secondary to cumulative UV radiation exposure/sun exposure over time - diffuse scaly erythematous macules with underlying dyspigmentation - Recommend daily broad spectrum sunscreen SPF 30+ to sun-exposed areas, reapply every 2 hours as needed.  - Recommend staying in the shade or wearing long sleeves, sun glasses (UVA+UVB protection) and wide brim hats (4-inch brim around the entire  circumference of the hat). - Call for new or changing lesions.  Seborrheic Keratoses - Stuck-on, waxy, tan-brown papules and/or plaques  - Benign-appearing - Discussed benign etiology and prognosis. - Observe - Call for any changes  Purpura - Chronic; persistent and recurrent.  Treatable, but not curable. - Violaceous macules and patches - Benign - Related to trauma, age, sun damage and/or use of blood thinners, chronic use of topical and/or oral steroids - Observe - Can use OTC arnica containing moisturizer such as Dermend Bruise Formula if desired - Call for worsening or other concerns  Lentigines - Scattered tan macules - Due to sun exposure - Benign-appering, observe - Recommend daily broad spectrum sunscreen SPF 30+ to sun-exposed areas, reapply every 2 hours as needed. - Call for any changes  Melanocytic Nevi - Tan-brown and/or pink-flesh-colored symmetric macules and papules, including right flank - Benign appearing on exam today - Observation - Call clinic for new or changing moles - Recommend daily use of broad spectrum spf 30+ sunscreen to sun-exposed areas.   Hemangiomas - Red papules - Discussed benign nature - Observe - Call for any changes  Rosacea face  Rosacea is a chronic progressive skin condition usually affecting the face of adults, causing redness and/or acne bumps. It is treatable but not curable. It sometimes affects the eyes (ocular rosacea) as well. It may respond to topical and/or systemic medication and can flare with stress, sun exposure, alcohol, exercise and some foods.  Daily application of broad spectrum spf 30+ sunscreen to face is recommended  to reduce flares.  Continue metronidazole 0.75% cream Apply to face qd (bid with flares) dsp 45g 1 yr Rf.  metroNIDAZOLE (METROCREAM) 0.75 % cream - face Apply to face once a day for rosacea, twice daily with flares.  AK (actinic keratosis) (8) R upper forehead x 4, chest x 3, R hand dorsum x  1  Actinic keratoses are precancerous spots that appear secondary to cumulative UV radiation exposure/sun exposure over time. They are chronic with expected duration over 1 year. A portion of actinic keratoses will progress to squamous cell carcinoma of the skin. It is not possible to reliably predict which spots will progress to skin cancer and so treatment is recommended to prevent development of skin cancer.  Recommend daily broad spectrum sunscreen SPF 30+ to sun-exposed areas, reapply every 2 hours as needed.  Recommend staying in the shade or wearing long sleeves, sun glasses (UVA+UVB protection) and wide brim hats (4-inch brim around the entire circumference of the hat). Call for new or changing lesions.  Destruction of lesion - R upper forehead x 4, chest x 3, R hand dorsum x 1  Destruction method: cryotherapy   Informed consent: discussed and consent obtained   Lesion destroyed using liquid nitrogen: Yes   Region frozen until ice ball extended beyond lesion: Yes   Outcome: patient tolerated procedure well with no complications   Post-procedure details: wound care instructions given   Additional details:  Prior to procedure, discussed risks of blister formation, small wound, skin dyspigmentation, or rare scar following cryotherapy. Recommend Vaseline ointment to treated areas while healing.   Neoplasm of uncertain behavior of skin (2) Right mid sternum  Skin / nail biopsy Type of biopsy: tangential   Informed consent: discussed and consent obtained   Patient was prepped and draped in usual sterile fashion: Area prepped with alcohol. Anesthesia: the lesion was anesthetized in a standard fashion   Anesthetic:  1% lidocaine w/ epinephrine 1-100,000 buffered w/ 8.4% NaHCO3 Instrument used: flexible razor blade   Hemostasis achieved with: pressure, aluminum chloride and electrodesiccation   Outcome: patient tolerated procedure well   Post-procedure details: wound care instructions  given   Post-procedure details comment:  Ointment and bandaid applied  Specimen 1 - Surgical pathology Differential Diagnosis: Inflamed SK vs AK r/o BCC Check Margins: No 7.0 mm pink scaly flat papule with telangiectasia  Left Chest  Skin / nail biopsy Type of biopsy: tangential   Informed consent: discussed and consent obtained   Patient was prepped and draped in usual sterile fashion: Area prepped with alcohol. Anesthesia: the lesion was anesthetized in a standard fashion   Anesthetic:  1% lidocaine w/ epinephrine 1-100,000 buffered w/ 8.4% NaHCO3 Instrument used: flexible razor blade   Hemostasis achieved with: pressure, aluminum chloride and electrodesiccation   Outcome: patient tolerated procedure well   Post-procedure details: wound care instructions given   Post-procedure details comment:  Ointment and small bandage applied  Specimen 2 - Surgical pathology Differential Diagnosis: Inflamed SK vs AK r/o BCC Check Margins: No 8.0 x 6.0 mm pink brown slightly pearly patch  If positive, plan EDC.   Inflamed seborrheic keratosis Left Popliteal  vs BCC  Recheck on follow-up.  Destruction of lesion - Left Popliteal  Destruction method: cryotherapy   Informed consent: discussed and consent obtained   Lesion destroyed using liquid nitrogen: Yes   Region frozen until ice ball extended beyond lesion: Yes   Outcome: patient tolerated procedure well with no complications   Post-procedure details: wound care  instructions given   Additional details:  Prior to procedure, discussed risks of blister formation, small wound, skin dyspigmentation, or rare scar following cryotherapy. Recommend Vaseline ointment to treated areas while healing.   Xerosis cutis trunk; extremities; scalp  With Pruritus  Recommend mild soap and moisturizing cream 1-2 times daily.  Gentle skin care handout provided.   Recommend OTC Gold Bond Rapid Relief Anti-Itch cream (pramoxine + menthol), CeraVe  Anti-itch cream or lotion (pramoxine), Sarna lotion (Original- menthol + camphor or Sensitive- pramoxine) or Eucerin 12 hour Itch Relief lotion (menthol) up to 3 times per day to areas on body that are itchy.  Sample of Free & Clear Zinc 2% shampoo - massage into scalp and let sit several minutes before rinsing.      Return in about 6 months (around 09/26/2021) for recheck left popliteal, Hx AKs, sooner pending bxs.  IJamesetta Orleans, CMA, am acting as scribe for Brendolyn Patty, MD .  Documentation: I have reviewed the above documentation for accuracy and completeness, and I agree with the above.  Brendolyn Patty MD

## 2021-03-26 NOTE — Patient Instructions (Addendum)
Cryotherapy Aftercare  Wash gently with soap and water everyday.   Apply Vaseline and Band-Aid daily until healed.    Recommend OTC Gold Bond Rapid Relief Anti-Itch cream (pramoxine + menthol), CeraVe Anti-itch cream or lotion (pramoxine), Sarna lotion (Original- menthol + camphor or Sensitive- pramoxine) or Eucerin 12 hour Itch Relief lotion (menthol) up to 3 times per day to areas on body that are itchy.  Free & Clear 2% Zinc Shampoo - Massage into scalp and let sit several minutes before rinsing.    Wound Care Instructions  Cleanse wound gently with soap and water once a day then pat dry with clean gauze. Apply a thing coat of Petrolatum (petroleum jelly, "Vaseline") over the wound (unless you have an allergy to this). We recommend that you use a new, sterile tube of Vaseline. Do not pick or remove scabs. Do not remove the yellow or white "healing tissue" from the base of the wound.  Cover the wound with fresh, clean, nonstick gauze and secure with paper tape. You may use Band-Aids in place of gauze and tape if the would is small enough, but would recommend trimming much of the tape off as there is often too much. Sometimes Band-Aids can irritate the skin.  You should call the office for your biopsy report after 1 week if you have not already been contacted.  If you experience any problems, such as abnormal amounts of bleeding, swelling, significant bruising, significant pain, or evidence of infection, please call the office immediately.  FOR ADULT SURGERY PATIENTS: If you need something for pain relief you may take 1 extra strength Tylenol (acetaminophen) AND 2 Ibuprofen ('200mg'$  each) together every 4 hours as needed for pain. (do not take these if you are allergic to them or if you have a reason you should not take them.) Typically, you may only need pain medication for 1 to 3 days.      If You Need Anything After Your Visit  If you have any questions or concerns for your doctor,  please call our main line at [phone] and press option 4 to reach your doctor's medical assistant. If no one answers, please leave a voicemail as directed and we will return your call as soon as possible. Messages left after 4 pm will be answered the following business day.   You may also send [place] a message via [place]. We typically respond to MyChart messages within 1-2 business days.  For prescription refills, please ask your pharmacy to contact our office. Our fax number is [phone].  If you have an urgent issue when the clinic is closed that cannot wait until the next business day, you can page your doctor at the number below.    Please note that while we do our best to be available for urgent issues outside of office hours, we are not available 24/7.   If you have an urgent issue and are unable to reach [place], you may choose to seek medical care at your doctor's office, retail clinic, urgent care center, or emergency room.  If you have a medical emergency, please immediately call 911 or go to the emergency department.  Pager Numbers  - Dr. Nehemiah Massed: [phone]  - Dr. Laurence Ferrari: [phone]  - Dr. Nicole Kindred: [phone]  In the event of inclement weather, please call our main line at [phone] for an update on the status of any delays or closures.  Dermatology Medication Tips: Please keep the boxes that topical medications come in in order to help  keep track of the instructions about where and how to use these. Pharmacies typically print the medication instructions only on the boxes and not directly on the medication tubes.   If your medication is too expensive, please contact our office at [phone] option 4 or send [place] a message through [place].   We are unable to tell what your co-pay for medications will be in advance as this is different depending on your insurance coverage. However, we may be able to find a substitute medication at lower cost or fill out paperwork to get  insurance to cover a needed medication.   If a prior authorization is required to get your medication covered by your insurance company, please allow [place] 1-2 business days to complete this process.  Drug prices often vary depending on where the prescription is filled and some pharmacies may offer cheaper prices.  The website www.goodrx.com contains coupons for medications through different pharmacies. The prices here do not account for what the cost may be with help from insurance (it may be cheaper with your insurance), but the website can give you the price if you did not use any insurance.  - You can print the associated coupon and take it with your prescription to the pharmacy.  - You may also stop by our office during regular business hours and pick up a GoodRx coupon card.  - If you need your prescription sent electronically to a different pharmacy, notify our office through [facility] or by phone at [phone] option 4.     Si Usted Necesita Algo Despus de Su Visita  Tambin puede enviarnos un mensaje a travs de Pharmacist, community. Por lo general respondemos a los mensajes de MyChart en el transcurso de 1 a 2 das hbiles.  Para renovar recetas, por favor pida a su farmacia que se ponga en contacto con nuestra oficina. Harland Dingwall de fax es [place] [phone].  Si tiene un asunto urgente cuando la clnica est cerrada y que no puede esperar hasta el siguiente da hbil, puede llamar/localizar a su doctor(a) al nmero que aparece a continuacin.   Por favor, tenga en cuenta que aunque hacemos todo lo posible para estar disponibles para asuntos urgentes fuera del horario de [place], no estamos disponibles las 24 horas del da, los 7 das de la [place].   Si tiene un problema urgente y no puede comunicarse con nosotros, puede optar por buscar atencin mdica  en el consultorio de su doctor(a), en una clnica privada, en un centro de atencin urgente o en una sala de emergencias.  Si  tiene Engineering geologist, por favor llame inmediatamente al 911 o vaya a la sala de emergencias.  Nmeros de bper  - Dr. Nehemiah Massed: [phone]  - Dra. Moye: [phone]  - Dra. Nicole Kindred: [phone]  En caso de inclemencias del [place], por favor llame a Johnsie Kindred principal al [phone] para una actualizacin sobre el [place] de cualquier retraso o cierre.  Consejos para la medicacin en dermatologa: Por favor, guarde las cajas en las que vienen los medicamentos de uso tpico para ayudarle a seguir las instrucciones sobre dnde y cmo usarlos. Las farmacias generalmente imprimen las instrucciones del medicamento slo en las cajas y no directamente en los tubos del [place].   Si su medicamento es muy caro, por favor, pngase en contacto con Zigmund Daniel llamando al [phone] y presione la opcin 4 o envenos un mensaje a travs de Pharmacist, community.   No podemos decirle cul ser su copago por los  medicamentos por adelantado ya que esto es diferente dependiendo de la cobertura de su seguro. Sin embargo, es posible que podamos encontrar un medicamento sustituto a Electrical engineer un formulario para que el seguro cubra el medicamento que se considera necesario.   Si se requiere una autorizacin previa para que su compaa de seguros [place] su medicamento, por favor permtanos de 1 a 2 das hbiles para completar este proceso.  Los precios de los medicamentos varan con frecuencia dependiendo del Environmental consultant de dnde se surte la receta y alguna farmacias pueden ofrecer precios ms baratos.  El sitio web www.goodrx.com tiene cupones para medicamentos de Airline pilot. Los precios aqu no tienen en cuenta lo que podra costar con la ayuda del seguro (puede ser ms barato con su seguro), pero el sitio web puede darle el precio si no utiliz Research scientist (physical sciences).  - Puede imprimir el cupn correspondiente y llevarlo con su receta a la farmacia.  - Tambin puede pasar por nuestra oficina  durante el horario de atencin regular y Charity fundraiser una tarjeta de cupones de GoodRx.  - Si necesita que su receta se enve electrnicamente a una farmacia diferente, informe a nuestra oficina a travs de MyChart de [place] o por telfono llamando al [phone] y presione la opcin 4.

## 2021-03-28 ENCOUNTER — Encounter: Payer: Self-pay | Admitting: Internal Medicine

## 2021-03-28 ENCOUNTER — Telehealth: Payer: Self-pay

## 2021-03-28 NOTE — Telephone Encounter (Signed)
-----   Message from Brendolyn Patty, MD sent at 03/27/2021  7:39 PM EST ----- ?1. Skin , right mid sternum ?SQUAMOUS CELL CARCINOMA IN SITU, BASE INVOLVED ?2. Skin , left chest ?BASAL CELL CARCINOMA, NODULAR AND INFILTRATIVE PATTERNS, BASE INVOLVED ? ?1. SCCIS skin cancer- needs EDC ?2. BCC skin cancer- needs EDC ? ?-please call patient ?

## 2021-03-28 NOTE — Telephone Encounter (Signed)
Advised pt of bx results and scheduled pt for EDC x 2 on 04/24/21 at 9:15./sh ?

## 2021-03-28 NOTE — Telephone Encounter (Signed)
For your information  

## 2021-04-05 ENCOUNTER — Encounter: Payer: Self-pay | Admitting: Internal Medicine

## 2021-04-23 ENCOUNTER — Ambulatory Visit (INDEPENDENT_AMBULATORY_CARE_PROVIDER_SITE_OTHER): Payer: BC Managed Care – PPO | Admitting: Internal Medicine

## 2021-04-23 ENCOUNTER — Encounter: Payer: Self-pay | Admitting: Internal Medicine

## 2021-04-23 VITALS — BP 120/80 | HR 62 | Temp 97.9°F | Ht 62.0 in | Wt 132.4 lb

## 2021-04-23 DIAGNOSIS — R6889 Other general symptoms and signs: Secondary | ICD-10-CM

## 2021-04-23 DIAGNOSIS — E559 Vitamin D deficiency, unspecified: Secondary | ICD-10-CM | POA: Diagnosis not present

## 2021-04-23 DIAGNOSIS — E611 Iron deficiency: Secondary | ICD-10-CM

## 2021-04-23 DIAGNOSIS — E119 Type 2 diabetes mellitus without complications: Secondary | ICD-10-CM | POA: Diagnosis not present

## 2021-04-23 DIAGNOSIS — I10 Essential (primary) hypertension: Secondary | ICD-10-CM | POA: Diagnosis not present

## 2021-04-23 DIAGNOSIS — Z1389 Encounter for screening for other disorder: Secondary | ICD-10-CM | POA: Diagnosis not present

## 2021-04-23 DIAGNOSIS — R748 Abnormal levels of other serum enzymes: Secondary | ICD-10-CM

## 2021-04-23 DIAGNOSIS — M7989 Other specified soft tissue disorders: Secondary | ICD-10-CM | POA: Diagnosis not present

## 2021-04-23 DIAGNOSIS — R600 Localized edema: Secondary | ICD-10-CM | POA: Diagnosis not present

## 2021-04-23 DIAGNOSIS — Z832 Family history of diseases of the blood and blood-forming organs and certain disorders involving the immune mechanism: Secondary | ICD-10-CM | POA: Diagnosis not present

## 2021-04-23 LAB — COMPREHENSIVE METABOLIC PANEL
ALT: 22 U/L (ref 0–35)
AST: 23 U/L (ref 0–37)
Albumin: 4.5 g/dL (ref 3.5–5.2)
Alkaline Phosphatase: 68 U/L (ref 39–117)
BUN: 11 mg/dL (ref 6–23)
CO2: 25 mEq/L (ref 19–32)
Calcium: 9.8 mg/dL (ref 8.4–10.5)
Chloride: 107 mEq/L (ref 96–112)
Creatinine, Ser: 0.9 mg/dL (ref 0.40–1.20)
GFR: 69.05 mL/min (ref >60.00)
Glucose, Bld: 88 mg/dL (ref 70–99)
Potassium: 3.5 mEq/L (ref 3.5–5.1)
Sodium: 140 mEq/L (ref 135–145)
Total Bilirubin: 0.4 mg/dL (ref 0.2–1.2)
Total Protein: 6.9 g/dL (ref 6.0–8.3)

## 2021-04-23 LAB — CBC WITH DIFFERENTIAL/PLATELET
Basophils Absolute: 0.1 10*3/uL (ref 0.0–0.1)
Basophils Relative: 1.4 % (ref 0.0–3.0)
Eosinophils Absolute: 0.1 10*3/uL (ref 0.0–0.7)
Eosinophils Relative: 1.5 % (ref 0.0–5.0)
HCT: 40.5 % (ref 36.0–46.0)
Hemoglobin: 13.6 g/dL (ref 12.0–15.0)
Lymphocytes Relative: 32.2 % (ref 12.0–46.0)
Lymphs Abs: 2.1 10*3/uL (ref 0.7–4.0)
MCHC: 33.7 g/dL (ref 30.0–36.0)
MCV: 87.5 fl (ref 78.0–100.0)
Monocytes Absolute: 0.4 10*3/uL (ref 0.1–1.0)
Monocytes Relative: 6 % (ref 3.0–12.0)
Neutro Abs: 3.9 10*3/uL (ref 1.4–7.7)
Neutrophils Relative %: 58.9 % (ref 43.0–77.0)
Platelets: 314 10*3/uL (ref 150.0–400.0)
RBC: 4.63 Mil/uL (ref 3.87–5.11)
RDW: 13.8 % (ref 11.5–15.5)
WBC: 6.6 10*3/uL (ref 4.0–10.5)

## 2021-04-23 LAB — LIPID PANEL
Cholesterol: 150 mg/dL (ref 0–200)
HDL: 60.2 mg/dL (ref >39.00)
LDL Cholesterol: 69 mg/dL (ref 0–99)
NonHDL: 90.29
Total CHOL/HDL Ratio: 2
Triglycerides: 106 mg/dL (ref 0.0–149.0)
VLDL: 21.2 mg/dL (ref 0.0–40.0)

## 2021-04-23 LAB — VITAMIN D 25 HYDROXY (VIT D DEFICIENCY, FRACTURES): VITD: 25.69 ng/mL — ABNORMAL LOW (ref 30.00–100.00)

## 2021-04-23 LAB — IBC + FERRITIN
Ferritin: 44.9 ng/mL (ref 10.0–291.0)
Iron: 64 ug/dL (ref 42–145)
Saturation Ratios: 16.7 % — ABNORMAL LOW (ref 20.0–50.0)
TIBC: 382.2 ug/dL (ref 250.0–450.0)
Transferrin: 273 mg/dL (ref 212.0–360.0)

## 2021-04-23 LAB — TSH: TSH: 2.67 u[IU]/mL (ref 0.35–5.50)

## 2021-04-23 LAB — HEMOGLOBIN A1C: Hgb A1c MFr Bld: 5.5 % (ref 4.6–6.5)

## 2021-04-23 MED ORDER — AMLODIPINE BESYLATE 5 MG PO TABS: 5.0000 mg | ORAL_TABLET | Freq: Every day | ORAL | 3 refills | Status: DC

## 2021-04-23 NOTE — Patient Instructions (Addendum)
Coppertone knee high socks [employer]  ? ?Amlodipine Tablets ?What is this medication? ?AMLODIPINE (am LOE di peen) treats high blood pressure and prevents chest pain (angina). It works by relaxing the blood vessels, which helps decrease the amount of work your heart has to do. It belongs to a group of medications called calcium channel blockers. ?This medicine may be used for other purposes; ask your health care provider or pharmacist if you have questions. ?COMMON BRAND NAME(S): Norvasc ?What should I tell my care team before I take this medication? ?They need to know if you have any of these conditions: ?Heart disease ?Liver disease ?An unusual or allergic reaction to amlodipine, other medications, foods, dyes, or preservatives ?Pregnant or trying to get pregnant ?Breast-feeding ?How should I use this medication? ?Take this medication by mouth. Take it as directed on the prescription label at the same time every day. You can take it with or without food. If it upsets your stomach, take it with food. Keep taking it unless your care team tells you to stop. ?Talk to your care team about the use of this medication in children. While it may be prescribed for children as young as 6 for selected conditions, precautions do apply. ?Overdosage: If you think you have taken too much of this medicine contact a poison control center or emergency room at once. ?NOTE: This medicine is only for you. Do not share this medicine with others. ?What if I miss a dose? ?If you miss a dose, take it as soon as you can. If it is almost time for your next dose, take only that dose. Do not take double or extra doses. ?What may interact with this medication? ?Clarithromycin ?Cyclosporine ?Diltiazem ?Itraconazole ?Simvastatin ?Tacrolimus ?This list may not describe all possible interactions. Give your health care provider a list of all the medicines, herbs, non-prescription drugs, or dietary supplements you use. Also tell them if you smoke, drink  alcohol, or use illegal drugs. Some items may interact with your medicine. ?What should I watch for while using this medication? ?Visit your health care provider for regular checks on your progress. Check your blood pressure as directed. Ask your health care provider what your blood pressure should be. Also, find out when you should contact him or her. ?Do not treat yourself for coughs, colds, or pain while you are using this medication without asking your health care provider for advice. Some medications may increase your blood pressure. ?You may get drowsy or dizzy. Do not drive, use machinery, or do anything that needs mental alertness until you know how this medication affects you. Do not stand up or sit up quickly, especially if you are an older patient. This reduces the risk of dizzy or fainting spells. Alcohol can make you more drowsy and dizzy. Avoid alcoholic drinks. ?What side effects may I notice from receiving this medication? ?Side effects that you should report to your care team as soon as possible: ?Allergic reactions--skin rash, itching, hives, swelling of the face, lips, tongue, or throat ?Heart attack--pain or tightness in the chest, shoulders, arms, or jaw, nausea, shortness of breath, cold or clammy skin, feeling faint or lightheaded ?Low blood pressure--dizziness, feeling faint or lightheaded, blurry vision ?Side effects that usually do not require medical attention (report these to your care team if they continue or are bothersome): ?Facial flushing, redness ?Heart palpitations--rapid, pounding, or irregular heartbeat ?Nausea ?Stomach pain ?Swelling of the ankles, hands, or feet ?This list may not describe all possible side effects. Call  your doctor for medical advice about side effects. You may report side effects to FDA at 1-800-FDA-1088. ?Where should I keep my medication? ?Keep out of the reach of children and pets. ?Store at room temperature between 20 and 25 degrees C (68 and 77 degrees  F). Protect from light and moisture. Keep the container tightly closed. Get rid of any unused medication after the expiration date. ?To get rid of medications that are no longer needed or have expired: ?Take the medication to a medication take-back program. Check with your pharmacy or law enforcement to find a location. ?If you cannot return the medication, check the label or package insert to see if the medication should be thrown out in the garbage or flushed down the toilet. If you are not sure, ask your health care provider. If it is safe to put in the trash, empty the medication out of the container. Mix the medication with cat litter, dirt, coffee grounds, or other unwanted substance. Seal the mixture in a bag or container. Put it in the trash. ?NOTE: This sheet is a summary. It may not cover all possible information. If you have questions about this medicine, talk to your doctor, pharmacist, or health care provider. ?? 2022 Elsevier/Gold Standard (2020-09-25 00:00:00) ? ?

## 2021-04-23 NOTE — Progress Notes (Signed)
Patient stated that she has has swelling in both her legs every afternoon since  04/11/21 no matter what she is doing? Patient stated that she has not taken anything and when swollen her legs are tight to the touch. ?

## 2021-04-23 NOTE — Progress Notes (Signed)
Chief Complaint  ?Patient presents with  ? Follow-up  ? ?F/u  ?1. Leg edema/foot x 1 month b/l with pitting on norvasc 10 mg no other sx's no pain or redness ?2. C/o cold intolerance new ?3. FH factor V leiden wants to be checked and make sure she does not have ? ?Review of Systems  ?Constitutional:  Negative for weight loss.  ?HENT:  Negative for hearing loss.   ?Eyes:  Negative for blurred vision.  ?Respiratory:  Negative for shortness of breath.   ?Cardiovascular:  Positive for leg swelling. Negative for chest pain.  ?Gastrointestinal:  Negative for abdominal pain and blood in stool.  ?Genitourinary:  Negative for dysuria.  ?Musculoskeletal:  Negative for falls and joint pain.  ?Skin:  Negative for rash.  ?Neurological:  Negative for headaches.  ?Psychiatric/Behavioral:  Negative for depression.   ?Past Medical History:  ?Diagnosis Date  ? Allergy   ? Arthritis   ? Basal cell carcinoma   ? nose, left cheek, right upper lip, right posterior shoulder most recent 6 years ago on left cheek  ? Basal cell carcinoma 03/26/2021  ? L chest, [facility]  ? Bleeding   ? Chicken pox   ? Complication of anesthesia   ? blood pressure elevated after gastric sleeve surgery in recovery, pt does not want mask over face  ? COVID-19   ? 03/01/20 had MAB 03/03/20 and felt better next day 03/04/20  ? Depression   ? Diabetes mellitus without complication ([place])   ? pre-diabetic currently, was diabetic prior to gastric sleeve surgery   ? Disorder of right rotator cuff   ? 10/03/19 MRI right shoulder abnormal Dr. Maurine Cane  ? Fall   ? 08/2020  ? Fall   ? 03/28/21  ? Fatty liver   ? Gout   ? Hematuria 10/15/2018  ? History of shingles   ? Hyperlipidemia   ? Hypertension   ? no problems since losing weight after gastric sleeve surgery  ? IBS (irritable bowel syndrome)   ? Insomnia   ? Migraine   ? Obesity   ? Osteopenia   ? Squamous cell carcinoma of skin 03/26/2021  ? SCC IS R mid sternum, pt scheduled for EDC  ? ?Past Surgical History:   ?Procedure Laterality Date  ? ABDOMINAL HYSTERECTOMY    ? no h/o abnormal pap still has 1 ovary left  ? ANKLE ARTHROSCOPY Left 05/29/2016  ? Procedure: ANKLE ARTHROSCOPY;  Surgeon: [name], DPM;  Location: [facility];  Service: Podiatry;  Laterality: Left;  ? APPENDECTOMY    ? basal cell removed    ? [place]  ? CHOLECYSTECTOMY    ? COLONOSCOPY WITH PROPOFOL N/A 01/31/2019  ? Procedure: COLONOSCOPY WITH PROPOFOL;  Surgeon: Lin Landsman, MD;  Location[facility] ENDOSCOPY;  Service: Gastroenterology;  Laterality: N/A;  ? cyst removed from wrist    ? ESOPHAGOGASTRODUODENOSCOPY (EGD) WITH PROPOFOL N/A 01/31/2019  ? Procedure: ESOPHAGOGASTRODUODENOSCOPY (EGD) WITH PROPOFOL;  Surgeon: Lin Landsman, MD;  Location: [facility] ENDOSCOPY;  Service: Gastroenterology;  Laterality: N/A;  ? FLEXIBLE SIGMOIDOSCOPY N/A 05/21/2020  ? Procedure: FLEXIBLE SIGMOIDOSCOPY;  Surgeon: Lin Landsman, MD;  Location[facility] ENDOSCOPY;  Service: Gastroenterology;  Laterality: N/A;  ? FOOT SURGERY    ? left foot 11/2016 Dr. Laymond Purser   ? left elbow surgery    ? 07/2017 Dr Leanor Kail epicondylitis   ? MOHS SURGERY    ? x  2 face nose and left cheek   ? OSTEOCHONDROMA EXCISION Left 05/29/2016  ? Procedure: OSTEOCHONDRAL DRILLING TALUS;  Surgeon: [name], DPM;  Location: [place];  Service: Podiatry;  Laterality: Left;  ? PLANTAR FASCIA RELEASE Left 05/29/2016  ? Procedure: ENDOSCOPIC PLANTAR FASCIOTOMY;  Surgeon: [name], DPM;  Location: [place];  Service: Podiatry;  Laterality: Left;  ? ROTATOR CUFF REPAIR    ? ? right likely shoulder  ? STOMACH SURGERY  2017  ? gastric sleeve surgery  ? TENNIS ELBOW RELEASE/NIRSCHEL PROCEDURE Left 08/05/2017  ? Procedure: TENNIS ELBOW RELEASE/NIRSCHEL PROCEDURE;  Surgeon: Leanor Kail, MD;  Location: [facility] ORS;  Service: Orthopedics;  Laterality: Left;  ? TUBAL LIGATION    ? wedge resection of ovary  1982  ? ?Family History  ?Problem Relation Age of Onset  ? Heart  disease Mother   ? Breast cancer Mother 5  ?     metastatic, now hip, spine, and leg   ? Arthritis Mother   ? Hyperlipidemia Mother   ? Cancer Mother   ?     spinal surgery. breast dx'ed age 52 now metastatic 55 as of 07/13/18  ? Cystic fibrosis Mother   ?     carrier   ? Heart disease Father   ? Lung cancer Father   ?     smoker  ? Hyperlipidemia Father   ? Hypertension Father   ? Diabetes Sister   ? Stroke Sister   ? Other Sister   ?     covid +  ? Diabetes Sister   ? Arthritis Maternal Grandmother   ? Diabetes Maternal Grandfather   ? Hyperlipidemia Maternal Grandfather   ? Diabetes Maternal Aunt   ? Diabetes Maternal Aunt   ? Diabetes Maternal Uncle   ? Sudden death Cousin   ? Cystic fibrosis Other   ?     great niece   ? Factor V Leiden deficiency Other   ?     uncle and cousins and m GF (all maternal)  ? Colon cancer Neg Hx   ? Ovarian cancer Neg Hx   ? ?Social History  ? ?Socioeconomic History  ? Marital status: Married  ?  Spouse name: Not on file  ? Number of children: 3  ? Years of education: Not on file  ? Highest education level: Some college, no degree  ?Occupational History  ? Not on file  ?Tobacco Use  ? Smoking status: Never  ? Smokeless tobacco: Never  ?Vaping Use  ? Vaping Use: Never used  ?Substance and Sexual Activity  ? Alcohol use: Never  ? Drug use: Never  ? Sexual activity: Yes  ?  Partners: Male  ?  Birth control/protection: Surgical  ?Other Topics Concern  ? Not on file  ?Social History Narrative  ? Married x 37 years as of 10/2017   ? Kids 3 boys youngest is Eddie Dibbles  ? Write childrens books   ? Retired from [employer] 01/2017   ? Enjoys traveling   ? Right handed  ? Caffeine: quit 2009  ? ?Social Determinants of Health  ? ?Financial Resource Strain: Low Risk   ? Difficulty of Paying Living Expenses: Not hard at all  ?Food Insecurity: Not on file  ?Transportation Needs: Not on file  ?Physical Activity: Not on file  ?Stress: Not on file  ?Social Connections: Not on file  ?Intimate Partner Violence:  Not on file  ? ?Current Meds  ?Medication Sig  ? fexofenadine (  ALLEGRA) 180 MG tablet TAKE 1 TABLET (180 MG TOTAL) BY MOUTH DAILY AS NEEDED FOR ALLERGIES OR RHINITIS.  ? metroNIDAZOLE (METROCREAM) 0.75 % cream Apply to face once a day for rosacea, twice daily with flares.  ? montelukast (SINGULAIR) 10 MG tablet Take 1 tablet (10 mg total) by mouth at bedtime.  ? ondansetron (ZOFRAN-ODT) 4 MG disintegrating tablet Take 1 tablet (4 mg total) by mouth every 8 (eight) hours as needed. for nausea  ? potassium chloride (MICRO-K) 10 MEQ CR capsule Take 2 capsules (20 mEq total) by mouth daily.  ? Rimegepant Sulfate (NURTEC) 75 MG TBDP Take 75 mg by mouth daily as needed (take for abortive therapy of migraine, no more than 1 tablet in 24 hours or 10 per month).  ? rosuvastatin (CRESTOR) 20 MG tablet Take 1 tablet (20 mg total) by mouth daily.  ? tirzepatide (MOUNJARO) 5 MG/0.5ML Pen Inject 5 mg into the skin once a week.  ? zolpidem (AMBIEN CR) 12.5 MG CR tablet Take 1 tablet (12.5 mg total) by mouth at bedtime as needed.  ? [DISCONTINUED] amLODipine (NORVASC) 10 MG tablet Take 1 tablet (10 mg total) by mouth daily. D/c 5 mg norvasc d/c maxzide 1/2 pill 37.5-25  ? ?Allergies  ?Allergen Reactions  ? Tetracyclines & Related Swelling and Rash  ?  FACE SWELL, RASH ON CHEST  ? Amoxicillin   ? Alcohol Rash  ?  ABDOMINAL CRAMPS, drinking alcohol  ? Azithromycin Rash  ? Metoprolol Diarrhea  ? ?No results found for this or any previous visit (from the past 2160 hour(s)). ?Objective  ?Body mass index is 24.22 kg/m?. ?Wt Readings from Last 3 Encounters:  ?04/23/21 132 lb 6.4 oz (60.1 kg)  ?03/13/21 138 lb 9.6 oz (62.9 kg)  ?02/07/21 143 lb (64.9 kg)  ? ?Temp Readings from Last 3 Encounters:  ?04/23/21 97.9 ?F (36.6 ?C) (Oral)  ?03/13/21 97.8 ?F (36.6 ?C) (Oral)  ?12/25/20 (!) 96.9 ?F (36.1 ?C) (Temporal)  ? ?BP Readings from Last 3 Encounters:  ?04/23/21 120/80  ?03/13/21 110/70  ?02/07/21 116/68  ? ?Pulse Readings from Last 3  Encounters:  ?04/23/21 62  ?03/13/21 78  ?02/07/21 97  ? ? ?Physical Exam ?Vitals and nursing note reviewed.  ?Constitutional:   ?   Appearance: Normal appearance. She is well-developed and well-groomed.

## 2021-04-24 ENCOUNTER — Encounter: Payer: Self-pay | Admitting: Dermatology

## 2021-04-24 ENCOUNTER — Ambulatory Visit (INDEPENDENT_AMBULATORY_CARE_PROVIDER_SITE_OTHER): Payer: BC Managed Care – PPO | Admitting: Dermatology

## 2021-04-24 DIAGNOSIS — L570 Actinic keratosis: Secondary | ICD-10-CM

## 2021-04-24 DIAGNOSIS — L821 Other seborrheic keratosis: Secondary | ICD-10-CM

## 2021-04-24 DIAGNOSIS — C44519 Basal cell carcinoma of skin of other part of trunk: Secondary | ICD-10-CM | POA: Diagnosis not present

## 2021-04-24 DIAGNOSIS — D099 Carcinoma in situ, unspecified: Secondary | ICD-10-CM

## 2021-04-24 DIAGNOSIS — D045 Carcinoma in situ of skin of trunk: Secondary | ICD-10-CM | POA: Diagnosis not present

## 2021-04-24 DIAGNOSIS — L820 Inflamed seborrheic keratosis: Secondary | ICD-10-CM

## 2021-04-24 DIAGNOSIS — L578 Other skin changes due to chronic exposure to nonionizing radiation: Secondary | ICD-10-CM | POA: Diagnosis not present

## 2021-04-24 LAB — URINALYSIS, ROUTINE W REFLEX MICROSCOPIC
Bacteria, UA: NONE SEEN /HPF
Bilirubin Urine: NEGATIVE
Glucose, UA: NEGATIVE
Hgb urine dipstick: NEGATIVE
Hyaline Cast: NONE SEEN /LPF
Leukocytes,Ua: NEGATIVE
Nitrite: NEGATIVE
Specific Gravity, Urine: 1.019 (ref 1.001–1.035)
pH: 6 (ref 5.0–8.0)

## 2021-04-24 NOTE — Patient Instructions (Signed)
Wound Care Instructions ? ?Cleanse wound gently with soap and water once a day then pat dry with clean gauze. Apply a thing coat of Petrolatum (petroleum jelly, "Vaseline") over the wound (unless you have an allergy to this). We recommend that you use a new, sterile tube of Vaseline. Do not pick or remove scabs. Do not remove the yellow or white "healing tissue" from the base of the wound. ? ?Cover the wound with fresh, clean, nonstick gauze and secure with paper tape. You may use Band-Aids in place of gauze and tape if the would is small enough, but would recommend trimming much of the tape off as there is often too much. Sometimes Band-Aids can irritate the skin. ? ?If you experience any problems, such as abnormal amounts of bleeding, swelling, significant bruising, significant pain, or evidence of infection, please call the office immediately. ? ?FOR ADULT SURGERY PATIENTS: If you need something for pain relief you may take 1 extra strength Tylenol (acetaminophen) AND 2 Ibuprofen ('200mg'$  each) together every 4 hours as needed for pain. (do not take these if you are allergic to them or if you have a reason you should not take them.) Typically, you may only need pain medication for 1 to 3 days.  ? ? ?If You Need Anything After Your Visit ? ?If you have any questions or concerns for your doctor, please call our main line at [phone] and press option 4 to reach your doctor's medical assistant. If no one answers, please leave a voicemail as directed and we will return your call as soon as possible. Messages left after 4 pm will be answered the following business day.  ? ?You may also send [place] a message via MyChart. We typically respond to MyChart messages within 1-2 business days. ? ?For prescription refills, please ask your pharmacy to contact our office. Our fax number is [phone]. ? ?If you have an urgent issue when the clinic is closed that cannot wait until the next business day, you can page your doctor at  the number below.   ? ?Please note that while we do our best to be available for urgent issues outside of office hours, we are not available 24/7.  ? ?If you have an urgent issue and are unable to reach [place], you may choose to seek medical care at your doctor's office, retail clinic, urgent care center, or emergency room. ? ?If you have a medical emergency, please immediately call 911 or go to the emergency department. ? ?Pager Numbers ? ?- Dr. Nehemiah Massed: [phone] ? ?- Dr. Laurence Ferrari: [phone] ? ?- Dr. Nicole Kindred: [phone] ? ?In the event of inclement weather, please call our main line at [phone] for an update on the status of any delays or closures. ? ?Dermatology Medication Tips: ?Please keep the boxes that topical medications come in in order to help keep track of the instructions about where and how to use these. Pharmacies typically print the medication instructions only on the boxes and not directly on the medication tubes.  ? ?If your medication is too expensive, please contact our office at [phone] option 4 or send [place] a message through [place].  ? ?We are unable to tell what your co-pay for medications will be in advance as this is different depending on your insurance coverage. However, we may be able to find a substitute medication at lower cost or fill out paperwork to get insurance to cover a needed medication.  ? ?If a prior authorization is required to get  your medication covered by your insurance company, please allow [place] 1-2 business days to complete this process. ? ?Drug prices often vary depending on where the prescription is filled and some pharmacies may offer cheaper prices. ? ?The website www.goodrx.com contains coupons for medications through different pharmacies. The prices here do not account for what the cost may be with help from insurance (it may be cheaper with your insurance), but the website can give you the price if you did not use any insurance.  ?- You can print the  associated coupon and take it with your prescription to the pharmacy.  ?- You may also stop by our office during regular business hours and pick up a GoodRx coupon card.  ?- If you need your prescription sent electronically to a different pharmacy, notify our office through [facility] or by phone at [phone] option 4. ? ? ? ? ?Si Usted Necesita Algo Despu?s de Su Visita ? ?Tambi?n puede enviarnos un mensaje a trav?s de MyChart. Por lo general respondemos a los mensajes de MyChart en el transcurso de 1 a 2 d?as h?biles. ? ?Para renovar recetas, por favor pida a su farmacia que se ponga en contacto con nuestra oficina. Nuestro n?mero de fax es el [phone]. ? ?Si tiene un asunto urgente cuando la cl?nica est? cerrada y que no puede esperar hasta el siguiente d?a h?bil, puede llamar/localizar a su doctor(a) al n?mero que aparece a continuaci?n.  ? ?Por favor, tenga en cuenta que aunque hacemos todo lo posible para estar disponibles para asuntos urgentes fuera del horario de oficina, no estamos disponibles las 24 horas del d?a, los 7 d?as de la semana.  ? ?Si tiene un problema urgente y no puede comunicarse con nosotros, puede optar por buscar atenci?n m?dica  en el consultorio de su doctor(a), en una cl?nica privada, en un centro de atenci?n urgente o en una sala de emergencias. ? ?Si tiene Engineer, maintenance (IT) m?dica, por favor llame inmediatamente al 911 o vaya a la sala de emergencias. ? ?N?meros de b?per ? ?- Dr. Nehemiah Massed: [phone] ? ?- Dra. Moye: [phone] ? ?- Dra. Nicole Kindred: [phone] ? ?En caso de inclemencias del tiempo, por favor llame a nuestra l?nea principal al [phone] para una actualizaci?n sobre el estado de cualquier retraso o cierre. ? ?Consejos para la medicaci?n en dermatolog?a: ?Por favor, guarde las cajas en las que vienen los medicamentos de uso t?pico para ayudarle a seguir las instrucciones sobre d?nde y c?mo usarlos. Las farmacias generalmente imprimen las instrucciones  del medicamento s?lo en las cajas y no directamente en los tubos del [place].  ? ?Si su medicamento es muy caro, por favor, p?ngase en contacto con Zigmund Daniel llamando al [phone] y presione la opci?n 4 o env?enos un mensaje a trav?s de MyChart.  ? ?No podemos decirle cu?l ser? su copago por los medicamentos por adelantado ya que esto es diferente dependiendo de la cobertura de su seguro. Sin embargo, es posible que podamos encontrar un medicamento sustituto a Electrical engineer un formulario para que el seguro cubra el medicamento que se considera necesario.  ? ?Si se requiere Ardelia Mems autorizaci?n previa para que su compa??a de seguros [place] su medicamento, por favor perm?tanos de 1 a 2 d?as h?biles para completar este proceso. ? ?Los precios de los medicamentos var?an con frecuencia dependiendo del Environmental consultant de d?nde se surte la receta y alguna farmacias pueden ofrecer precios m?s baratos. ? ?El sitio web www.goodrx.com tiene cupones para medicamentos de Airline pilot. Los  precios aqu? no tienen en cuenta lo que podr?a costar con la ayuda del seguro (puede ser m?s barato con su seguro), pero el sitio web puede darle el precio si no utiliz? ning?n seguro.  ?- Puede imprimir el cup?n correspondiente y llevarlo con su receta a la farmacia.  ?- Tambi?n puede pasar por nuestra oficina durante el horario de atenci?n regular y recoger una tarjeta de cupones de GoodRx.  ?- Si necesita que su receta se env?e electr?nicamente a Chiropodist, informe a nuestra oficina a trav?s de MyChart de [employer] o por tel?fono llamando al [phone] y presione la opci?n 4. ? ?

## 2021-04-24 NOTE — Progress Notes (Signed)
? ?Follow-Up Visit ?  ?Subjective  ?Kristin [name] is a 61 y.o. 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 EDC today. ) and Basal Cell Carcinoma (Biopsy proven of the left chest. EDC today.).  Also spot on chest still itches, didn't go away with freezing.  Has new scaly spot on hand to check as well. ? ? ?The following portions of the chart were reviewed this encounter and updated as appropriate:  ?  ?  ? ?Review of Systems:  No other skin or systemic complaints except as noted in HPI or Assessment and Plan. ? ?Objective  ?Well appearing patient in no apparent distress; mood and affect are within normal limits. ? ?A focused examination was performed including face, chest. Relevant physical exam findings are noted in the Assessment and Plan. ? ?Right mid sternum ?Pink biopsy site ? ?Left Chest ?Pink biopsy site ? ?Left Popliteal ?Pink macule ? ?L lower clavicle ?Pink scaly papule ? ?Left thenar hand ?keratotic macule ? ? ? ?Assessment & Plan  ? ?Actinic Damage ?- chronic, secondary to cumulative UV radiation exposure/sun exposure over time ?- diffuse scaly erythematous macules with underlying dyspigmentation ?- Recommend daily broad spectrum sunscreen SPF 30+ to sun-exposed areas, reapply every 2 hours as needed.  ?- Recommend staying in the shade or wearing long sleeves, sun glasses (UVA+UVB protection) and wide brim hats (4-inch brim around the entire circumference of the hat). ?- Call for new or changing lesions. ? ?Seborrheic Keratoses ?- Stuck-on, waxy, tan-brown papules and/or plaques, right neck at jaw  ?- Benign-appearing ?- Discussed benign etiology and prognosis. ?- Observe ?- Call for any changes ? ?Squamous cell carcinoma in situ ?Right mid sternum ? ?Destruction of lesion ? ?Destruction method: electrodesiccation and curettage   ?Informed consent: discussed and consent obtained   ?Timeout:  patient name, date of birth, surgical site,  and procedure verified ?Anesthesia: the lesion was anesthetized in a standard fashion   ?Anesthetic:  1% lidocaine w/ epinephrine 1-100,000 local infiltration ?Curettage performed in three different directions: Yes   ?Electrodesiccation performed over the curetted area: Yes   ?Final wound size (cm):  0.9 ?Hemostasis achieved with:  pressure, aluminum chloride and electrodesiccation ?Outcome: patient tolerated procedure well with no complications   ?Post-procedure details: wound care instructions given   ?Post-procedure details comment:  Ointment and bandage applied. ? ?Basal cell carcinoma of skin of other part of trunk ?Left Chest ? ?Destruction of lesion ? ?Destruction method: electrodesiccation and curettage   ?Informed consent: discussed and consent obtained   ?Timeout:  patient name, date of birth, surgical site, and procedure verified ?Anesthesia: the lesion was anesthetized in a standard fashion   ?Anesthetic:  1% lidocaine w/ epinephrine 1-100,000 local infiltration ?Curettage performed in three different directions: Yes   ?Electrodesiccation performed over the curetted area: Yes   ?Final wound size (cm):  1.2 ?Hemostasis achieved with:  pressure, aluminum chloride and electrodesiccation ?Outcome: patient tolerated procedure well with no complications   ?Post-procedure details: wound care instructions given   ?Post-procedure details comment:  Ointment and bandage applied. ? ?Inflamed seborrheic keratosis ?Left Popliteal ? ?vs BCC, improved but still healing from cryotherapy treatment. ? ?Recheck on follow-up. ? ?Hypertrophic actinic keratosis ?L lower clavicle ? ?vs Inflamed SK ? ?2nd cryotherapy treatment today, recheck on f/up ?Consider PDT to chest in fall ? ?Actinic keratoses are precancerous spots that appear secondary to cumulative UV radiation exposure/sun exposure over time. They are chronic  with expected duration over 1 year. A portion of actinic keratoses will progress to squamous cell carcinoma of  the skin. It is not possible to reliably predict which spots will progress to skin cancer and so treatment is recommended to prevent development of skin cancer. ? ?Recommend daily broad spectrum sunscreen SPF 30+ to sun-exposed areas, reapply every 2 hours as needed.  ?Recommend staying in the shade or wearing long sleeves, sun glasses (UVA+UVB protection) and wide brim hats (4-inch brim around the entire circumference of the hat). ?Call for new or changing lesions. ? ?Destruction of lesion - L lower clavicle ? ?Destruction method: cryotherapy   ?Informed consent: discussed and consent obtained   ?Lesion destroyed using liquid nitrogen: Yes   ?Region frozen until ice ball extended beyond lesion: Yes   ?Outcome: patient tolerated procedure well with no complications   ?Post-procedure details: wound care instructions given   ?Additional details:  Prior to procedure, discussed risks of blister formation, small wound, skin dyspigmentation, or rare scar following cryotherapy. Recommend Vaseline ointment to treated areas while healing.  ? ?AK (actinic keratosis) ?Left thenar hand ? ?Patient defers treatment today since new. Recheck on follow-up. ? ?Actinic keratoses are precancerous spots that appear secondary to cumulative UV radiation exposure/sun exposure over time. They are chronic with expected duration over 1 year. A portion of actinic keratoses will progress to squamous cell carcinoma of the skin. It is not possible to reliably predict which spots will progress to skin cancer and so treatment is recommended to prevent development of skin cancer. ? ?Recommend daily broad spectrum sunscreen SPF 30+ to sun-exposed areas, reapply every 2 hours as needed.  ?Recommend staying in the shade or wearing long sleeves, sun glasses (UVA+UVB protection) and wide brim hats (4-inch brim around the entire circumference of the hat). ?Call for new or changing lesions. ? ? ?Return as scheduled, for Hx BCC, Hx SCC, recheck left  popliteal and AK-chest and L thenar hand. ? ?I, Jamesetta Orleans, CMA, am acting as scribe for Brendolyn Patty, MD . ? ?Documentation: I have reviewed the above documentation for accuracy and completeness, and I agree with the above. ? ?Brendolyn Patty MD  ? ?

## 2021-04-29 LAB — FACTOR 5 LEIDEN: Result: NEGATIVE

## 2021-04-29 LAB — BRAIN NATRIURETIC PEPTIDE: Brain Natriuretic Peptide: 23 pg/mL (ref <100)

## 2021-04-29 LAB — D-DIMER, QUANTITATIVE: D-Dimer, Quant: 0.42 mcg/mL FEU (ref <0.50)

## 2021-04-30 ENCOUNTER — Telehealth: Payer: Self-pay

## 2021-04-30 NOTE — Telephone Encounter (Signed)
Lvm for pt to return call in regards to labs.  ?

## 2021-05-08 ENCOUNTER — Other Ambulatory Visit: Payer: BC Managed Care – PPO

## 2021-05-12 ENCOUNTER — Other Ambulatory Visit: Payer: Self-pay | Admitting: Family Medicine

## 2021-05-12 DIAGNOSIS — E1165 Type 2 diabetes mellitus with hyperglycemia: Secondary | ICD-10-CM

## 2021-05-14 ENCOUNTER — Other Ambulatory Visit: Payer: BC Managed Care – PPO

## 2021-05-28 ENCOUNTER — Ambulatory Visit (INDEPENDENT_AMBULATORY_CARE_PROVIDER_SITE_OTHER): Payer: BC Managed Care – PPO | Admitting: Internal Medicine

## 2021-05-28 ENCOUNTER — Encounter: Payer: Self-pay | Admitting: Internal Medicine

## 2021-05-28 VITALS — BP 120/70 | HR 71 | Temp 97.8°F | Resp 14 | Ht 62.0 in | Wt 129.4 lb

## 2021-05-28 DIAGNOSIS — K580 Irritable bowel syndrome with diarrhea: Secondary | ICD-10-CM

## 2021-05-28 DIAGNOSIS — R41840 Attention and concentration deficit: Secondary | ICD-10-CM | POA: Diagnosis not present

## 2021-05-28 DIAGNOSIS — I959 Hypotension, unspecified: Secondary | ICD-10-CM

## 2021-05-28 DIAGNOSIS — R55 Syncope and collapse: Secondary | ICD-10-CM

## 2021-05-28 DIAGNOSIS — E162 Hypoglycemia, unspecified: Secondary | ICD-10-CM

## 2021-05-28 DIAGNOSIS — R42 Dizziness and giddiness: Secondary | ICD-10-CM | POA: Diagnosis not present

## 2021-05-28 MED ORDER — FREESTYLE LITE TEST VI STRP: ORAL_STRIP | 12 refills | Status: DC

## 2021-05-28 NOTE — Progress Notes (Signed)
Chief Complaint  ?Patient presents with  ? Acute Visit  ?  Issue w/Amlodipine she states it would give her sudden hot flashes and dizziness ongoing x3 wks. BP would drop to 90/50 or 90/55. She cut her medicine in 1/2 w/o relief. Still having minor hot flashes and dizziness. Disc about ADHD son has it.  ? ?F/u  ?1. Presyncopal feeling of being hot and dizzy with norvasc 5 mg qd and bp was 90/50 and 90/55 in [place] seeing family w/in last 3 weeks and she cut BP med in 1/2 1 week ago and bp readings the same ?She had to lie down and elevated legs in a chair but did not feel good with recurrent episodes did not check blood sugar but given meter to do this today  ?She has had significant wt loss with moujuaro 5 mg weekly and at times does not feel hungry ? ?2. Migraines and ibs disc amitriptyline 25-50 mg not taking could cause wt loss she will rethink taking this again  ?3. Attn disorder son Eddie Dibbles has adhd and she wants to be tested as well  ? ? ?Review of Systems  ?Constitutional:  Negative for weight loss.  ?HENT:  Negative for hearing loss.   ?Eyes:  Negative for blurred vision.  ?Respiratory:  Negative for shortness of breath.   ?Cardiovascular:  Negative for chest pain.  ?Gastrointestinal:  Negative for abdominal pain and blood in stool.  ?Genitourinary:  Negative for dysuria.  ?Musculoskeletal:  Negative for falls and joint pain.  ?Skin:  Negative for rash.  ?Neurological:  Positive for dizziness. Negative for headaches.  ?Psychiatric/Behavioral:  Negative for depression.   ?Past Medical History:  ?Diagnosis Date  ? Allergy   ? Arthritis   ? Basal cell carcinoma   ? nose, left cheek, right upper lip, right posterior shoulder most recent 6 years ago on left cheek  ? Basal cell carcinoma 03/26/2021  ? L chest, [facility] 04/24/2021  ? Bleeding   ? Chicken pox   ? Complication of anesthesia   ? blood pressure elevated after gastric sleeve surgery in recovery, pt does not want mask over face  ? COVID-19   ? 03/01/20 had MAB  03/03/20 and felt better next day 03/04/20  ? Depression   ? Diabetes mellitus without complication ([place])   ? pre-diabetic currently, was diabetic prior to gastric sleeve surgery   ? Disorder of right rotator cuff   ? 10/03/19 MRI right shoulder abnormal Dr. Maurine Cane  ? Fall   ? 08/2020  ? Fall   ? 03/28/21  ? Fatty liver   ? Gout   ? Hematuria 10/15/2018  ? History of shingles   ? Hyperlipidemia   ? Hypertension   ? no problems since losing weight after gastric sleeve surgery  ? IBS (irritable bowel syndrome)   ? Insomnia   ? Migraine   ? Obesity   ? Osteopenia   ? Squamous cell carcinoma of skin 03/26/2021  ? SCC IS R mid sternum, EDC 04/24/2021  ? ?Past Surgical History:  ?Procedure Laterality Date  ? ABDOMINAL HYSTERECTOMY    ? no h/o abnormal pap still has 1 ovary left  ? ANKLE ARTHROSCOPY Left 05/29/2016  ? Procedure: ANKLE ARTHROSCOPY;  Surgeon: [name], DPM;  Location: [place];  Service: Podiatry;  Laterality: Left;  ? APPENDECTOMY    ? basal cell removed    ? [place]  ? CHOLECYSTECTOMY    ?  COLONOSCOPY WITH PROPOFOL N/A 01/31/2019  ? Procedure: COLONOSCOPY WITH PROPOFOL;  Surgeon: Lin Landsman, MD;  [facility] END[facility]COPY;  Service: Gastroenterology;  Laterality: N/A;  ? cyst removed from wrist    ? ESOPHAGOGASTRODUODENOSCOPY (EGD) WITH PROPOFOL N/A 01/31/2019  ? Procedure: ESOPHAGOGASTRODUODENOSCOPY (EGD) WITH PROPOFOL;  Surgeon: Lin Landsman, MD;  Location: [facility] ENDOSCOPY;  Service: Gastroenterology;  Laterality: N/A;  ? FLEXIBLE SIGMOIDOSCOPY N/A 05/21/2020  ? Procedure: FLEXIBLE SIGMOIDOSCOPY;  Surgeon: Lin Landsman, MD;  Location[facility] ENDOSCOPY;  Service: Gastroenterology;  Laterality: N/A;  ? FOOT SURGERY    ? left foot 11/2016 Dr. Laymond Purser   ? left elbow surgery    ? 07/2017 Dr Leanor Kail epicondylitis   ? MOHS SURGERY    ? x 2 face nose and left cheek   ? OSTEOCHONDROMA EXCISION Left 05/29/2016  ? Procedure: OSTEOCHONDRAL DRILLING TALUS;  Surgeon:  [name], DPM;  Location: [place];  Service: Podiatry;  Laterality: Left;  ? PLANTAR FASCIA RELEASE Left 05/29/2016  ? Procedure: ENDOSCOPIC PLANTAR FASCIOTOMY;  Surgeon: [name], DPM;  Location: [place];  Service: Podiatry;  Laterality: Left;  ? ROTATOR CUFF REPAIR    ? ? right likely shoulder  ? STOMACH SURGERY  2017  ? gastric sleeve surgery  ? TENNIS ELBOW RELEASE/NIRSCHEL PROCEDURE Left 08/05/2017  ? Procedure: TENNIS ELBOW RELEASE/NIRSCHEL PROCEDURE;  Surgeon: Leanor Kail, MD;  Location: [facility] ORS;  Service: Orthopedics;  Laterality: Left;  ? TUBAL LIGATION    ? wedge resection of ovary  1982  ? ?Family History  ?Problem Relation Age of Onset  ? Heart disease Mother   ? Breast cancer Mother 39  ?     metastatic, now hip, spine, and leg   ? Arthritis Mother   ? Hyperlipidemia Mother   ? Cancer Mother   ?     spinal surgery. breast dx'ed age 74 now metastatic 53 as of 07/13/18  ? Cystic fibrosis Mother   ?     carrier   ? Heart disease Father   ? Lung cancer Father   ?     smoker  ? Hyperlipidemia Father   ? Hypertension Father   ? Diabetes Sister   ? Stroke Sister   ? Other Sister   ?     covid +  ? Diabetes Sister   ? Arthritis Maternal Grandmother   ? Diabetes Maternal Grandfather   ? Hyperlipidemia Maternal Grandfather   ? Diabetes Maternal Aunt   ? Diabetes Maternal Aunt   ? Diabetes Maternal Uncle   ? Sudden death Cousin   ? Cystic fibrosis Other   ?     great niece   ? Factor V Leiden deficiency Other   ?     uncle and cousins and m GF (all maternal)  ? Colon cancer Neg Hx   ? Ovarian cancer Neg Hx   ? ?Social History  ? ?Socioeconomic History  ? Marital status: Married  ?  Spouse name: Not on file  ? Number of children: 3  ? Years of education: Not on file  ? Highest education level: Some college, no degree  ?Occupational History  ? Not on file  ?Tobacco Use  ? Smoking status: Never  ? Smokeless tobacco: Never  ?Vaping Use  ? Vaping Use: Never used  ?Substance and Sexual Activity  ? Alcohol  use: Never  ? Drug use: Never  ? Sexual activity: Yes  ?  Partners: Male  ?  Birth control/protection: Surgical  ?Other Topics Concern  ? Not on file  ?Social History Narrative  ? Married x 37 years as of 10/2017   ? Kids 3 boys youngest is Eddie Dibbles  ? Write childrens books   ? Retired from [employer] 01/2017   ? Enjoys traveling   ? Right handed  ? Caffeine: quit 2009  ? ?Social Determinants of Health  ? ?Financial Resource Strain: Low Risk   ? Difficulty of Paying Living Expenses: Not hard at all  ?Food Insecurity: Not on file  ?Transportation Needs: Not on file  ?Physical Activity: Not on file  ?Stress: Not on file  ?Social Connections: Not on file  ?Intimate Partner Violence: Not on file  ? ?Current Meds  ?Medication Sig  ? albuterol (VENTOLIN HFA) 108 (90 Base) MCG/ACT inhaler Inhale 1-2 puffs into the lungs every 6 (six) hours as needed for wheezing or shortness of breath.  ? Cholecalciferol (DIALYVITE VITAMIN D 5000 PO) Take 5,000 Units by mouth every other day.  ? fexofenadine (ALLEGRA) 180 MG tablet TAKE 1 TABLET (180 MG TOTAL) BY MOUTH DAILY AS NEEDED FOR ALLERGIES OR RHINITIS.  ? gabapentin (NEURONTIN) 300 MG capsule Take 1 capsule (300 mg total) by mouth 3 (three) times daily as needed.  ? glucose blood (FREESTYLE LITE) test strip Use as instructed daily. E11.59, E16.2. dispense one touch verio reflect strips  ? metroNIDAZOLE (METROCREAM) 0.75 % cream Apply to face once a day for rosacea, twice daily with flares.  ? montelukast (SINGULAIR) 10 MG tablet Take 1 tablet (10 mg total) by mouth at bedtime.  ? MOUNJARO 5 MG/0.5ML Pen INJECT 5 MG INTO THE SKIN ONCE A WEEK.  ? Multiple Vitamin (MULTIVITAMIN) tablet Take 1 tablet by mouth daily.  ? omeprazole (PRILOSEC) 40 MG capsule TAKE 1 CAPSULE (40 MG TOTAL) BY MOUTH DAILY BEFORE SUPPER.  ? ondansetron (ZOFRAN-ODT) 4 MG disintegrating tablet Take 1 tablet (4 mg total) by mouth every 8 (eight) hours as needed. for nausea  ? potassium chloride (MICRO-K) 10 MEQ CR  capsule Take 2 capsules (20 mEq total) by mouth daily.  ? Rimegepant Sulfate (NURTEC) 75 MG TBDP Take 75 mg by mouth daily as needed (take for abortive therapy of migraine, no more than 1 tablet in 24 hours or 10

## 2021-05-28 NOTE — Patient Instructions (Addendum)
[facility] ?4.7 ?245 Google reviews ?Medical clinic in [place], [place] ?Address: [address], [place], [place] [address] ?Hours:  ?Open ? Closes 5?PM ?Phone: [phone] ? ?[facility] ? [address]    ? [address]  ? [place] [address]  ? ? ?Living With Attention Deficit Hyperactivity Disorder ?If you have been diagnosed with attention deficit hyperactivity disorder (ADHD), you may be relieved that you now know why you have felt or behaved a certain way. Still, you may feel overwhelmed about the treatment ahead. You may also wonder how to get the support you need and how to deal with the condition day-to-day. With treatment and support, you can live with ADHD and manage your symptoms. ?How to manage lifestyle changes ?Managing stress ?Stress is your body's reaction to life changes and events, both good and bad. To cope with the stress of an ADHD diagnosis, it may help to: ?Learn more about ADHD. ?Exercise regularly. Even a short daily walk can lower stress levels. ?Participate in training or education programs (including social skills training classes) that teach you to deal with symptoms. ? ?Medicines ?Your health care provider may suggest certain medicines if he or she feels that they will help to improve your condition. Stimulant medicines are usually prescribed to treat ADHD, and therapy may also be prescribed. It is important to: ?Avoid using alcohol and other substances that may prevent your medicines from working properly (may interact). ?Talk with your pharmacist or health care provider about all the medicines that you take, their possible side effects, and what medicines are safe to take together. ?Make it your goal to take part in all treatment decisions (shared decision-making). Ask about possible side effects of medicines that your health care provider recommends, and tell him or her how you feel about having those side effects. It is best if shared  decision-making with your health care provider is part of your total treatment plan. ?Relationships ?To strengthen your relationships with family members while treating your condition, consider taking part in family therapy. You might also attend self-help groups alone or with a loved one. ?Be honest about how your symptoms affect your relationships. Make an effort to communicate respectfully instead of fighting, and find ways to show others that you care. Psychotherapy may be useful in helping you cope with how ADHD affects your relationships. ?How to recognize changes in your condition ?The following signs may mean that your treatment is working well and your condition is improving: ?Consistently being on time for appointments. ?Being more organized at home and work. ?Other people noticing improvements in your behavior. ?Achieving goals that you set for yourself. ?Thinking more clearly. ?The following signs may mean that your treatment is not working very well: ?Feeling impatience or more confusion. ?Missing, forgetting, or being late for appointments. ?An increasing sense of disorganization and messiness. ?More difficulty in reaching goals that you set for yourself. ?Loved ones becoming angry or frustrated with you. ?Follow these instructions at home: ?Take over-the-counter and prescription medicines only as told by your health care provider. Check with your health care provider before taking any new medicines. ?Create structure and an organized atmosphere at home. For example: ?Make a list of tasks, then rank them from most important to least important. Work on one task at a time until your listed tasks are done. ?Make a daily schedule and follow it consistently every day. ?Use an appointment calendar, and check it 2 or 3 times a day to  keep on track. Keep it with you when you leave the house. ?Create spaces where you keep certain things, and always put things back in their places after you use them. ?Keep all  follow-up visits as told by your health care provider. This is important. ?Where to find support ?Talking to others ? ?Keep emotion out of important discussions and speak in a calm, logical way. ?Listen closely and patiently to your loved ones. Try to understand their point of view, and try to avoid getting defensive. ?Take responsibility for the consequences of your actions. ?Ask that others do not take your behaviors personally. ?Aim to solve problems as they come up, and express your feelings instead of bottling them up. ?Talk openly about what you need from your loved ones and how they can support you. ?Consider going to family therapy sessions or having your family meet with a specialist who deals with ADHD-related behavior problems. ?Finances ?Not all insurance plans cover mental health care, so it is important to check with your insurance carrier. If paying for co-pays or counseling services is a problem, search for a local or county mental health care center. Public mental health care services may be offered there at a low cost or no cost when you are not able to see a private health care provider. ?If you are taking medicine for ADHD, you may be able to get the generic form, which may be less expensive than brand-name medicine. Some makers of prescription medicines also offer help to patients who cannot afford the medicines that they need. ?Questions to ask your health care provider: ?What are the risks and benefits of taking medicines? ?Would I benefit from therapy? ?How often should I follow up with a health care provider? ?Contact a health care provider if: ?You have side effects from your medicines, such as: ?Repeated muscle twitches, coughing, or speech outbursts. ?Sleep problems. ?Loss of appetite. ?Depression. ?New or worsening behavior problems. ?Dizziness. ?Unusually fast heartbeat. ?Stomach pains. ?Headaches. ?Get help right away if: ?You have a severe reaction to a medicine. ?Your behavior  suddenly gets worse. ?Summary ?With treatment and support, you can live with ADHD and manage your symptoms. ?The medicines that are most often prescribed for ADHD are stimulants. ?Consider taking part in family therapy or self-help groups with family members or friends. ?When you talk with friends and family about your ADHD, be patient and communicate openly. ?Take over-the-counter and prescription medicines only as told by your health care provider. Check with your health care provider before taking any new medicines. ?This information is not intended to replace advice given to you by your health care provider. Make sure you discuss any questions you have with your health care provider. ?Document Revised: 05/20/2019 Document Reviewed: 06/22/2019 ?Elsevier Patient Education ? [place]. ? ?Low-FODMAP Eating Plan ? ?FODMAP stands for fermentable oligosaccharides, disaccharides, monosaccharides, and polyols. These are sugars that are hard for some people to digest. A low-FODMAP eating plan may help some people who have irritable bowel syndrome (IBS) and certain other bowel (intestinal) diseases to manage their symptoms. ?This meal plan can be complicated to follow. Work with a diet and nutrition specialist (dietitian) to make a low-FODMAP eating plan that is right for you. A dietitian can help make sure that you get enough nutrition from this diet. ?What are tips for following this plan? ?Reading food labels ?Check labels for hidden FODMAPs such as: ?High-fructose syrup. ?Honey. ?Agave. ?Natural fruit flavors. ?Onion or garlic powder. ?Choose low-FODMAP foods that  contain 3-4 grams of fiber per serving. ?Check food labels for serving sizes. Eat only one serving at a time to make sure FODMAP levels stay low. ?Shopping ?Shop with a list of foods that are recommended on this diet and make a meal plan. ?Meal planning ?Follow a low-FODMAP eating plan for up to 6 weeks, or as told by your health care provider or  dietitian. ?To follow the eating plan: ?Eliminate high-FODMAP foods from your diet completely. Choose only low-FODMAP foods to eat. You will do this for 2-6 weeks. ?Gradually reintroduce high-FODMAP foods into your diet

## 2021-05-29 LAB — HM DIABETES EYE EXAM

## 2021-06-19 ENCOUNTER — Encounter: Payer: Self-pay | Admitting: Internal Medicine

## 2021-06-19 ENCOUNTER — Ambulatory Visit (INDEPENDENT_AMBULATORY_CARE_PROVIDER_SITE_OTHER): Payer: BC Managed Care – PPO | Admitting: Internal Medicine

## 2021-06-19 VITALS — BP 110/70 | HR 72 | Temp 98.3°F | Resp 14 | Ht 62.0 in | Wt 125.0 lb

## 2021-06-19 DIAGNOSIS — B029 Zoster without complications: Secondary | ICD-10-CM

## 2021-06-19 DIAGNOSIS — L299 Pruritus, unspecified: Secondary | ICD-10-CM

## 2021-06-19 DIAGNOSIS — Z8619 Personal history of other infectious and parasitic diseases: Secondary | ICD-10-CM

## 2021-06-19 HISTORY — DX: Personal history of other infectious and parasitic diseases: Z86.19

## 2021-06-19 MED ORDER — HYDROXYZINE HCL 25 MG PO TABS: 25.0000 mg | ORAL_TABLET | Freq: Two times a day (BID) | ORAL | 3 refills | Status: DC | PRN

## 2021-06-19 MED ORDER — VALACYCLOVIR HCL 1 G PO TABS: 1000.0000 mg | ORAL_TABLET | Freq: Three times a day (TID) | ORAL | 0 refills | Status: DC

## 2021-06-19 NOTE — Patient Instructions (Signed)
Valacyclovir Tablets What is this medication? VALACYCLOVIR (val ay SYE kloe veer) helps manage infections, such as cold sores, genital herpes, shingles, and chickenpox, caused by viruses. It will not treat colds, the flu, or infections caused by bacteria. This medicine may be used for other purposes; ask your health care provider or pharmacist if you have questions. COMMON BRAND NAME(S): Valtrex What should I tell my care team before I take this medication? They need to know if you have any of these conditions: Acquired immunodeficiency syndrome (AIDS) Any other condition that may weaken the immune system Bone marrow or kidney transplant Kidney disease An unusual or allergic reaction to valacyclovir, acyclovir, ganciclovir, valganciclovir, other medications, foods, dyes, or preservatives Pregnant or trying to get pregnant Breast-feeding How should I use this medication? Take this medication by mouth with a glass of water. Follow the directions on the prescription label. You can take this medication with or without food. Take your doses at regular intervals. Do not take your medication more often than directed. Finish the full course prescribed by your care team even if you think your condition is better. Do not stop taking except on the advice of your care team. Talk to your care team about the use of this medication in children. While this medication may be prescribed for children as young as 2 years for selected conditions, precautions do apply. Overdosage: If you think you have taken too much of this medicine contact a poison control center or emergency room at once. NOTE: This medicine is only for you. Do not share this medicine with others. What if I miss a dose? If you miss a dose, take it as soon as you can. If it is almost time for your next dose, take only that dose. Do not take double or extra doses. What may interact with this medication? Do not take this medication with any of the  following: Cidofovir This medication may also interact with the following: Adefovir Amphotericin B Certain antibiotics like amikacin, gentamicin, tobramycin, vancomycin Cimetidine Cisplatin Colistin Cyclosporine Foscarnet Lithium Methotrexate Probenecid Tacrolimus This list may not describe all possible interactions. Give your health care provider a list of all the medicines, herbs, non-prescription drugs, or dietary supplements you use. Also tell them if you smoke, drink alcohol, or use illegal drugs. Some items may interact with your medicine. What should I watch for while using this medication? Tell your care team if your symptoms do not start to get better after 1 week. This medication works best when taken early in the course of an infection, within the first 72 hours. Begin treatment as soon as possible after the first signs of infection like tingling, itching, or pain in the affected area. It is possible that genital herpes may still be spread even when you are not having symptoms. Always use safer sex practices like condoms made of latex or polyurethane whenever you have sexual contact. You should stay well hydrated while taking this medication. Drink plenty of fluids. What side effects may I notice from receiving this medication? Side effects that you should report to your care team as soon as possible: Allergic reactions--skin rash, itching, hives, swelling of the face, lips, tongue, or throat Confusion Hallucinations Kidney injury--decrease in the amount of urine, swelling of the ankles, hands, or feet Seizures Side effects that usually do not require medical attention (report to your care team if they continue or are bothersome): Headache Nausea Stomach pain This list may not describe all possible side effects. Call  your doctor for medical advice about side effects. You may report side effects to FDA at 1-800-FDA-1088. Where should I keep my medication? Keep out of the  reach of children. Store at room temperature between 15 and 25 degrees C (59 and 77 degrees F). Keep container tightly closed. Throw away any unused medication after the expiration date. NOTE: This sheet is a summary. It may not cover all possible information. If you have questions about this medicine, talk to your doctor, pharmacist, or health care provider.  2023 Elsevier/Gold Standard (2020-03-16 00:00:00)  Shingles  Shingles, which is also known as herpes zoster, is an infection that causes a painful skin rash and fluid-filled blisters. It is caused by a virus. Shingles only develops in people who: Have had chickenpox. Have been vaccinated against chickenpox. Shingles is rare in this group. What are the causes? Shingles is caused by varicella-zoster virus. This is the same virus that causes chickenpox. After a person is exposed to the virus, it stays in the body in an inactive (dormant) state. Shingles develops if the virus is reactivated. This can happen many years after the first (initial) exposure to the virus. It is not known what causes this virus to be reactivated. What increases the risk? People who have had chickenpox or received the chickenpox vaccine are at risk for shingles. Shingles infection is more common in people who: Are older than 62 years of age. Have a weakened disease-fighting system (immune system), such as people with: HIV (human immunodeficiency virus). AIDS (acquired immunodeficiency syndrome). Cancer. Are taking medicines that weaken the immune system, such as organ transplant medicines. Are experiencing a lot of stress. What are the signs or symptoms? Early symptoms of this condition include itching, tingling, and pain in an area on your skin. Pain may be described as burning, stabbing, or throbbing. A few days or weeks after early symptoms start, a painful red rash appears. The rash is usually on one side of the body and has a band-like or belt-like pattern.  The rash eventually turns into fluid-filled blisters that break open, change into scabs, and dry up in about 2-3 weeks. At any time during the infection, you may also develop: A fever. Chills. A headache. Nausea. How is this diagnosed? This condition is diagnosed with a skin exam. Skin or fluid samples (a culture) may be taken from the blisters before a diagnosis is made. How is this treated? The rash may last for several weeks. There is not a specific cure for this condition. Your health care provider may prescribe medicines to help you manage pain, recover more quickly, and avoid long-term problems. Medicines may include: Antiviral medicines. Anti-inflammatory medicines. Pain medicines. Anti-itching medicines (antihistamines). If the area involved is on your face, you may be referred to a specialist, such as an eye doctor (ophthalmologist) or an ear, nose, and throat (ENT) doctor (otorhinolaryngologist) to help you avoid eye problems, chronic pain, or disability. Follow these instructions at home: Medicines Take over-the-counter and prescription medicines only as told by your health care provider. Apply an anti-itch cream or numbing cream to the affected area as told by your health care provider. Relieving itching and discomfort  Apply cold, wet cloths (cold compresses) to the area of the rash or blisters as told by your health care provider. Cool baths can be soothing. Try adding baking soda or dry oatmeal to the water to reduce itching. Do not bathe in hot water. Use calamine lotion as recommended by your health care provider. This  is an over-the-counter lotion that helps to relieve itchiness. Blister and rash care Keep your rash covered with a loose bandage (dressing). Wear loose-fitting clothing to help ease the pain of material rubbing against the rash. Wash your hands with soap and water for at least 20 seconds before and after you change your dressing. If soap and water are not  available, use hand sanitizer. Change your dressing as told by your health care provider. Keep your rash and blisters clean by washing the area with mild soap and cool water as told by your health care provider. Check your rash every day for signs of infection. Check for: More redness, swelling, or pain. Fluid or blood. Warmth. Pus or a bad smell. Do not scratch your rash or pick at your blisters. To help avoid scratching: Keep your fingernails clean and cut short. Wear gloves or mittens while you sleep, if scratching is a problem. General instructions Rest as told by your health care provider. Wash your hands often with soap and water for at least 20 seconds. If soap and water are not available, use hand sanitizer. Doing this lowers your chance of getting a bacterial skin infection. Before your blisters change into scabs, your shingles infection can cause chickenpox in people who have never had it or have never been vaccinated against it. To prevent this from happening, avoid contact with other people, especially: Babies. Pregnant women. Children who have eczema. Older people who have transplants. People who have chronic illnesses, such as cancer or AIDS. Keep all follow-up visits. This is important. How is this prevented? Getting vaccinated is the best way to prevent shingles and protect against shingles complications. If you have not been vaccinated, talk with your health care provider about getting the vaccine. Where to find more information Centers for Disease Control and Prevention: http://www.wolf.info/ Contact a health care provider if: Your pain is not relieved with prescribed medicines. Your pain does not get better after the rash heals. You have any of these signs of infection: More redness, swelling, or pain around the rash. Fluid or blood coming from the rash. Warmth coming from your rash. Pus or a bad smell coming from the rash. A fever. Get help right away if: The rash is on  your face or nose. You have facial pain, pain around your eye area, or loss of feeling on one side of your face. You have difficulty seeing. You have ear pain or have ringing in your ear. You have a loss of taste. Your condition gets worse. Summary Shingles, also known as herpes zoster, is an infection that causes a painful skin rash and fluid-filled blisters. This condition is diagnosed with a skin exam. Skin or fluid samples (a culture) may be taken from the blisters. Keep your rash covered with a loose bandage (dressing). Wear loose-fitting clothing to help ease the pain of material rubbing against the rash. Before your blisters change into scabs, your shingles infection can cause chickenpox in people who have never had it or have never been vaccinated against it. This information is not intended to replace advice given to you by your health care provider. Make sure you discuss any questions you have with your health care provider. Document Revised: 01/02/2020 Document Reviewed: 01/02/2020 Elsevier Patient Education  [place].

## 2021-06-19 NOTE — Progress Notes (Signed)
Chief Complaint  Patient presents with   Herpes Zoster    Pt c/o poss shingles, onset yesterday before 5 pm, patch in lower back and R side of thigh, using cortisone with minor relief and took 1 expired hydroxyzine refill.    F/u  1. Low back rash since 5 pm yesterday itching tried cortisone with minor relief wants refill of atarax bid prn for itching  Review of Systems  Skin:  Positive for itching and rash.  Past Medical History:  Diagnosis Date   Allergy    Arthritis    Basal cell carcinoma    nose, left cheek, right upper lip, right posterior shoulder most recent 6 years ago on left cheek   Basal cell carcinoma 03/26/2021   L chest, [facility] 04/24/2021   Bleeding    Chicken pox    Complication of anesthesia    blood pressure elevated after gastric sleeve surgery in recovery, pt does not want mask over face   COVID-19    03/01/20 had MAB 03/03/20 and felt better next day 03/04/20   Depression    Diabetes mellitus without complication ([place])    pre-diabetic currently, was diabetic prior to gastric sleeve surgery    Disorder of right rotator cuff    10/03/19 MRI right shoulder abnormal Dr. Maurine Cane   Fall    08/2020   Fall    03/28/21   Fatty liver    Gout    Hematuria 10/15/2018   History of shingles    Hyperlipidemia    Hypertension    no problems since losing weight after gastric sleeve surgery   IBS (irritable bowel syndrome)    Insomnia    Migraine    Obesity    Osteopenia    Squamous cell carcinoma of skin 03/26/2021   SCC IS R mid sternum, [facility] 04/24/2021   Past Surgical History:  Procedure Laterality Date   ABDOMINAL HYSTERECTOMY     no h/o abnormal pap still has 1 ovary left   ANKLE ARTHROSCOPY Left 05/29/2016   Procedure: ANKLE ARTHROSCOPY;  Surgeon: [name], DPM;  Location: [place];  Service: Podiatry;  Laterality: Left;   APPENDECTOMY     basal cell removed     [place] PROPOFOL N/A 01/31/2019    Procedure: COLONOSCOPY WITH PROPOFOL;  Surgeon: Lin Landsman, MD;  Location: [facility] ENDOSCOPY;  Service: Gastroenterology;  Laterality: N/A;   cyst removed from wrist     ESOPHAGOGASTRODUODENOSCOPY (EGD) WITH PROPOFOL N/A 01/31/2019   Procedure: ESOPHAGOGASTRODUODENOSCOPY (EGD) WITH PROPOFOL;  Surgeon: Lin Landsman, MD;  Location: [place];  Service: Gastroenterology;  Laterality: N/A;   FLEXIBLE SIGMOIDOSCOPY N/A 05/21/2020   Procedure: FLEXIBLE SIGMOIDOSCOPY;  Surgeon: Lin Landsman, MD;  Location: [facility] ENDOSCOPY;  Service: Gastroenterology;  Laterality: N/A;   FOOT SURGERY     left foot 11/2016 Dr. Laymond Purser    left elbow surgery     07/2017 Dr Leanor Kail epicondylitis    MOHS SURGERY     x 2 face nose and left cheek    OSTEOCHONDROMA EXCISION Left 05/29/2016   Procedure: OSTEOCHONDRAL DRILLING TALUS;  Surgeon: [name], DPM;  Location: [place];  Service: Podiatry;  Laterality: Left;   PLANTAR FASCIA RELEASE Left 05/29/2016   Procedure: ENDOSCOPIC PLANTAR FASCIOTOMY;  Surgeon: [name], DPM;  Location: [place];  Service: Podiatry;  Laterality: Left;   ROTATOR CUFF  REPAIR     ? right likely shoulder   STOMACH SURGERY  2017   gastric sleeve surgery   TENNIS ELBOW RELEASE/NIRSCHEL PROCEDURE Left 08/05/2017   Procedure: TENNIS ELBOW RELEASE/NIRSCHEL PROCEDURE;  Surgeon: Leanor Kail, MD;  Location: [facility];  Service: Orthopedics;  Laterality: Left;   TUBAL LIGATION     wedge resection of ovary  1982   Family History  Problem Relation Age of Onset   Heart disease Mother    Breast cancer Mother 58       metastatic, now hip, spine, and leg    Arthritis Mother    Hyperlipidemia Mother    Cancer Mother        spinal surgery. breast dx'ed age 67 now metastatic 21 as of 07/13/18   Cystic fibrosis Mother        carrier    Heart disease Father    Lung cancer Father        smoker   Hyperlipidemia Father    Hypertension Father    Diabetes Sister     Stroke Sister    Other Sister        covid +   Diabetes Sister    Arthritis Maternal Grandmother    Diabetes Maternal Grandfather    Hyperlipidemia Maternal Grandfather    Diabetes Maternal Aunt    Diabetes Maternal Aunt    Diabetes Maternal Uncle    Sudden death Cousin    Cystic fibrosis Other        great niece    Factor V Leiden deficiency Other        uncle and cousins and m GF (all maternal)   Colon cancer Neg Hx    Ovarian cancer Neg Hx    Social History   Socioeconomic History   Marital status: Married    Spouse name: Not on file   Number of children: 3   Years of education: Not on file   Highest education level: Some college, no degree  Occupational History   Not on file  Tobacco Use   Smoking status: Never   Smokeless tobacco: Never  Vaping Use   Vaping Use: Never used  Substance and Sexual Activity   Alcohol use: Never   Drug use: Never   Sexual activity: Yes    Partners: Male    Birth control/protection: Surgical  Other Topics Concern   Not on file  Social History Narrative   Married x 37 years as of 10/2017    Kids 3 boys youngest is Eddie Dibbles   Write childrens books    Retired from [employer] 01/2017    Enjoys traveling    Right handed   Caffeine: quit 2009   Social Determinants of Health   Financial Resource Strain: Low Risk    Difficulty of Paying Living Expenses: Not hard at all  Food Insecurity: Not on file  Transportation Needs: Not on file  Physical Activity: Not on file  Stress: Not on file  Social Connections: Not on file  Intimate Partner Violence: Not on file   Current Meds  Medication Sig   albuterol (VENTOLIN HFA) 108 (90 Base) MCG/ACT inhaler Inhale 1-2 puffs into the lungs every 6 (six) hours as needed for wheezing or shortness of breath.   Cholecalciferol (DIALYVITE VITAMIN D 5000 PO) Take 5,000 Units by mouth every other day.   fexofenadine (ALLEGRA) 180 MG tablet TAKE 1 TABLET (180 MG TOTAL) BY MOUTH DAILY AS NEEDED FOR ALLERGIES  OR RHINITIS.   gabapentin (NEURONTIN) 300  MG capsule Take 1 capsule (300 mg total) by mouth 3 (three) times daily as needed.   glucose blood (FREESTYLE LITE) test strip Use as instructed daily. E11.59, E16.2. dispense one touch verio reflect strips   metroNIDAZOLE (METROCREAM) 0.75 % cream Apply to face once a day for rosacea, twice daily with flares.   montelukast (SINGULAIR) 10 MG tablet Take 1 tablet (10 mg total) by mouth at bedtime.   MOUNJARO 5 MG/0.5ML Pen INJECT 5 MG INTO THE SKIN ONCE A WEEK.   Multiple Vitamin (MULTIVITAMIN) tablet Take 1 tablet by mouth daily.   omeprazole (PRILOSEC) 40 MG capsule TAKE 1 CAPSULE (40 MG TOTAL) BY MOUTH DAILY BEFORE SUPPER.   ondansetron (ZOFRAN-ODT) 4 MG disintegrating tablet Take 1 tablet (4 mg total) by mouth every 8 (eight) hours as needed. for nausea   potassium chloride (MICRO-K) 10 MEQ CR capsule Take 2 capsules (20 mEq total) by mouth daily.   Rimegepant Sulfate (NURTEC) 75 MG TBDP Take 75 mg by mouth daily as needed (take for abortive therapy of migraine, no more than 1 tablet in 24 hours or 10 per month).   rosuvastatin (CRESTOR) 20 MG tablet Take 1 tablet (20 mg total) by mouth daily.   topiramate (TOPAMAX) 50 MG tablet Take 1 tablet (50 mg total) by mouth 2 (two) times daily.   valACYclovir (VALTREX) 1000 MG tablet Take 1 tablet (1,000 mg total) by mouth 3 (three) times daily. X 10-14 With food   zolpidem (AMBIEN CR) 12.5 MG CR tablet Take 1 tablet (12.5 mg total) by mouth at bedtime as needed.   [DISCONTINUED] hydrOXYzine (ATARAX/VISTARIL) 25 MG tablet Take 25 mg by mouth 2 (two) times daily as needed.   Allergies  Allergen Reactions   Tetracyclines & Related Swelling and Rash    FACE SWELL, RASH ON CHEST   Amoxicillin    Alcohol Rash    ABDOMINAL CRAMPS, drinking alcohol   Azithromycin Rash   Metoprolol Diarrhea   Recent Results (from the past 2160 hour(s))  D-Dimer, Quantitative     Status: None   Collection Time: 04/23/21  1:30  PM  Result Value Ref Range   D-Dimer, Quant 0.42 <0.50 mcg/mL FEU    Comment: . The D-Dimer test is used frequently to exclude an acute PE or DVT. In patients with a low to moderate clinical risk assessment and a D-Dimer result <0.50 mcg/mL FEU, the likelihood of a PE or DVT is very low. However, a thromboembolic event should not be excluded solely on the basis of the D-Dimer level. Increased levels of D-Dimer are associated with a PE, DVT, DIC, malignancies, inflammation, sepsis, surgery, trauma, pregnancy, and advancing patient age. [Jama 2006 11:295(2):199-207] . For additional information, please refer to: http://education.questdiagnostics.com/faq/FAQ149 (This link is being provided for informational/ educational purposes only) .   B Nat Peptide     Status: None   Collection Time: 04/23/21  1:30 PM  Result Value Ref Range   Brain Natriuretic Peptide 23 <100 pg/mL    Comment: . BNP levels increase with age in the general population with the highest values seen in individuals greater than 75 years of age. Reference: J. Am. [place] Ar. Cardiol. 2002; 29:528-413. .   IBC + Ferritin     Status: Abnormal   Collection Time: 04/23/21  1:30 PM  Result Value Ref Range   Iron 64 42 - 145 ug/dL   Transferrin 273.0 212.0 - 360.0 mg/dL   Saturation Ratios 16.7 (L) 20.0 - 50.0 %   Ferritin  44.9 10.0 - 291.0 ng/mL   TIBC 382.2 250.0 - 450.0 mcg/dL  TSH     Status: None   Collection Time: 04/23/21  1:30 PM  Result Value Ref Range   TSH 2.67 0.35 - 5.50 uIU/mL  Comprehensive metabolic panel     Status: None   Collection Time: 04/23/21  1:30 PM  Result Value Ref Range   Sodium 140 135 - 145 mEq/L   Potassium 3.5 3.5 - 5.1 mEq/L   Chloride 107 96 - 112 mEq/L   CO2 25 19 - 32 mEq/L   Glucose, Bld 88 70 - 99 mg/dL   BUN 11 6 - 23 mg/dL   Creatinine, Ser 0.90 0.40 - 1.20 mg/dL   Total Bilirubin 0.4 0.2 - 1.2 mg/dL   Alkaline Phosphatase 68 39 - 117 U/L   AST 23 0 - 37 U/L   ALT 22 0  - 35 U/L   Total Protein 6.9 6.0 - 8.3 g/dL   Albumin 4.5 3.5 - 5.2 g/dL   GFR 69.05 >60.00 mL/min    Comment: Calculated using the CKD-EPI Creatinine Equation (2021)   Calcium 9.8 8.4 - 10.5 mg/dL  CBC with Differential/Platelet     Status: None   Collection Time: 04/23/21  1:30 PM  Result Value Ref Range   WBC 6.6 4.0 - 10.5 K/uL   RBC 4.63 3.87 - 5.11 Mil/uL   Hemoglobin 13.6 12.0 - 15.0 g/dL   HCT 40.5 36.0 - 46.0 %   MCV 87.5 78.0 - 100.0 fl   MCHC 33.7 30.0 - 36.0 g/dL   RDW 13.8 11.5 - 15.5 %   Platelets 314.0 150.0 - 400.0 K/uL   Neutrophils Relative % 58.9 43.0 - 77.0 %   Lymphocytes Relative 32.2 12.0 - 46.0 %   Monocytes Relative 6.0 3.0 - 12.0 %   Eosinophils Relative 1.5 0.0 - 5.0 %   Basophils Relative 1.4 0.0 - 3.0 %   Neutro Abs 3.9 1.4 - 7.7 K/uL   Lymphs Abs 2.1 0.7 - 4.0 K/uL   Monocytes Absolute 0.4 0.1 - 1.0 K/uL   Eosinophils Absolute 0.1 0.0 - 0.7 K/uL   Basophils Absolute 0.1 0.0 - 0.1 K/uL  Lipid panel     Status: None   Collection Time: 04/23/21  1:30 PM  Result Value Ref Range   Cholesterol 150 0 - 200 mg/dL    Comment: ATP III Classification       Desirable:  < 200 mg/dL               Borderline High:  200 - 239 mg/dL          High:  > = 240 mg/dL   Triglycerides 106.0 0.0 - 149.0 mg/dL    Comment: Normal:  <150 mg/dLBorderline High:  150 - 199 mg/dL   HDL 60.20 >39.00 mg/dL   VLDL 21.2 0.0 - 40.0 mg/dL   LDL Cholesterol 69 0 - 99 mg/dL   Total CHOL/HDL Ratio 2     Comment:                Men          Women1/2 Average Risk     3.4          3.3Average Risk          5.0          4.42X Average Risk          9.6  7.13X Average Risk          15.0          11.0                       NonHDL 90.29     Comment: NOTE:  Non-HDL goal should be 30 mg/dL higher than patient's LDL goal (i.e. LDL goal of < 70 mg/dL, would have non-HDL goal of < 100 mg/dL)  Urinalysis, Routine w reflex microscopic     Status: Abnormal   Collection Time: 04/23/21  1:30 PM   Result Value Ref Range   Color, Urine YELLOW YELLOW   APPearance CLOUDY (A) CLEAR   Specific Gravity, Urine 1.019 1.001 - 1.035   pH 6.0 5.0 - 8.0   Glucose, UA NEGATIVE NEGATIVE   Bilirubin Urine NEGATIVE NEGATIVE   Ketones, ur TRACE (A) NEGATIVE   Hgb urine dipstick NEGATIVE NEGATIVE   Protein, ur 1+ (A) NEGATIVE   Nitrite NEGATIVE NEGATIVE   Leukocytes,Ua NEGATIVE NEGATIVE   WBC, UA 0-5 0 - 5 /HPF   RBC / HPF 0-2 0 - 2 /HPF   Squamous Epithelial / LPF 0-5 < OR = 5 /HPF   Bacteria, UA NONE SEEN NONE SEEN /HPF   Hyaline Cast NONE SEEN NONE SEEN /LPF  Hemoglobin A1c     Status: None   Collection Time: 04/23/21  1:30 PM  Result Value Ref Range   Hgb A1c MFr Bld 5.5 4.6 - 6.5 %    Comment: Glycemic Control Guidelines for People with Diabetes:Non Diabetic:  <6%Goal of Therapy: <7%Additional Action Suggested:  >8%   Vitamin D (25 hydroxy)     Status: Abnormal   Collection Time: 04/23/21  1:30 PM  Result Value Ref Range   VITD 25.69 (L) 30.00 - 100.00 ng/mL  Factor 5 leiden     Status: None   Collection Time: 04/23/21  1:30 PM  Result Value Ref Range   Result NEGATIVE     Comment: FACTOR V LEIDEN (R506Q) VARIANT NOT DETECTED   INTERPRETATION See Below     Comment: INTERPRETATION: This individual is negative (normal) for the Factor V Leiden (R506Q) variant in the Factor V gene. Increased risk of thrombophilia can be caused by a variety of genetic and non-genetic factors not screened for by this assay. . . Laboratory results reviewed and released by qualified personnel. . . VARIANT ANALYSIS: The Factor V Leiden (R506Q) variant [[id number] (p.R534Q)] in the Factor V gene is one of the most common causes of inherited thrombophilia. This variant causes resistance to degradation of activated Factor V protein by activated Protein C (APC). . The Factor V Leiden (R506Q) variant is detected by amplification of the selected region of the Factor V gene by polymerase  chain reaction (PCR) and fluorescent probe hybridization to the targeted region, followed by end-point analysis with a real time PCR system. Although rare, false positive or false negative results may occur. All results should be interpreted in context of clini cal findings, relevant history, and other laboratory data. . Health care providers, please contact your local [place] genetic counselor or call [phone] for assistance with interpretation of these results. . This test was developed and its analytical performance characteristics have been determined by [facility]. It has not been cleared or approved by the FDA. This assay has been validated pursuant to the CLIA regulations and is used for clinical purposes.    Objective  Body mass index is 22.86 kg/m. Wt Readings from Last 3 Encounters:  06/19/21 125 lb (56.7 kg)  05/28/21 129 lb 6.4 oz (58.7 kg)  04/23/21 132 lb 6.4 oz (60.1 kg)   Temp Readings from Last 3 Encounters:  06/19/21 98.3 F (36.8 C) (Oral)  05/28/21 97.8 F (36.6 C) (Oral)  04/23/21 97.9 F (36.6 C) (Oral)   BP Readings from Last 3 Encounters:  06/19/21 110/70  05/28/21 120/70  04/23/21 120/80   Pulse Readings from Last 3 Encounters:  06/19/21 72  05/28/21 71  04/23/21 62    Physical Exam Vitals and nursing note reviewed.  Constitutional:      Appearance: Normal appearance. She is well-developed and well-groomed.  HENT:     Head: Normocephalic and atraumatic.  Eyes:     Conjunctiva/sclera: Conjunctivae normal.     Pupils: Pupils are equal, round, and reactive to light.  Cardiovascular:     Rate and Rhythm: Normal rate and regular rhythm.     Heart sounds: Normal heart sounds. No murmur heard. Pulmonary:     Effort: Pulmonary effort is normal.     Breath sounds: Normal breath sounds.  Abdominal:     General: Abdomen is flat. Bowel sounds are normal.      Tenderness: There is no abdominal tenderness.  Musculoskeletal:        General: No tenderness.  Skin:    General: Skin is warm and dry.       Neurological:     General: No focal deficit present.     Mental Status: She is alert and oriented to person, place, and time. Mental status is at baseline.     Cranial Nerves: Cranial nerves 2-12 are intact.     Motor: Motor function is intact.     Coordination: Coordination is intact.     Gait: Gait is intact.  Psychiatric:        Attention and Perception: Attention and perception normal.        Mood and Affect: Mood and affect normal.        Speech: Speech normal.        Behavior: Behavior normal. Behavior is cooperative.        Thought Content: Thought content normal.        Cognition and Memory: Cognition and memory normal.        Judgment: Judgment normal.    Assessment  Plan  Herpes zoster without complication - Plan: valACYclovir (VALTREX) 1000 MG tablet tid x 10-14 days Do not touch  Itching - Plan: hydrOXYzine (ATARAX) 25 MG tablet bid prn  HM Flu shot 09/18/20  Prevnar utd  utd Tdap 01/08/13 Rx shingrix prev not had  Utd prevnar consider pna 22 covid vx had 4/4 moderna consider 4th dose  hep A vaccine 2/2 immune h/o fatty liver hep B immune with h/o fatty liver  Hep C neg  MMR immune   Never smoker, chew congratulated not doing either    mammo 07/05/19 b/l diag normal right axillary mass fat pad likely MSK pain/ttp -screening mammo in 1 year ordered call and sch 07/31/20 negative can and schedule     Colonoscopy -obtained records 07/15/11 colonoscopy Dr. Candace Cruise nl repeat in 10 years  Colonoscopy 01/31/19 Dr. Marius Ditch IH prep fair hemmorhiods, tubular adenoma f/u in 5 years likely with GI   Pap s/p hysterectomy h.o fibroids ? Cervix intact has appt Dr. Enzo Bi 02/10/17.  -Appt went well 01/2017 ob/gyn f/u in 1 year -per note pap  not needed she will f/u with Dr. Marcelline Mates now last seen 03/10/2018  pap 08/19/19 neg neg HPV    DEXA consider age 27    Dermatology sees [place] [facility] q6 months h/o BCC nose and left cheek s/p Mohs and h/o Aks   -seen 06/07/19 f/u 12/05/2019 Dr. Brendolyn Patty 03/26/21 disc with them about bruising    06/04/2020 [facility] neg retinopathy    Healthy diet and exercise rec Provider: Dr. Olivia Mackie [facility]

## 2021-06-26 ENCOUNTER — Ambulatory Visit: Payer: BC Managed Care – PPO | Admitting: Internal Medicine

## 2021-07-02 ENCOUNTER — Encounter: Payer: Self-pay | Admitting: Internal Medicine

## 2021-07-06 ENCOUNTER — Other Ambulatory Visit: Payer: Self-pay | Admitting: Internal Medicine

## 2021-07-06 DIAGNOSIS — G4700 Insomnia, unspecified: Secondary | ICD-10-CM

## 2021-07-08 ENCOUNTER — Telehealth: Payer: Self-pay

## 2021-07-08 NOTE — Telephone Encounter (Signed)
Patient states she went to pick up her zolpidem (AMBIEN CR) 12.5 MG CR tablet refill, and found that we had only called in a one month supply, instead of the usual six month supply.  Patient states she would like to know why we did this.  Patient states that she did not pick up the one month supply of the prescription and would like to have the six month supply instead.    *Patient states her preferred pharmacy is [facility] in [place].

## 2021-07-11 ENCOUNTER — Ambulatory Visit
Admission: EM | Admit: 2021-07-11 | Discharge: 2021-07-11 | Disposition: A | Discharge status: Home / Self Care | Payer: BC Managed Care – PPO | Attending: Family Medicine | Admitting: Family Medicine

## 2021-07-11 DIAGNOSIS — L249 Irritant contact dermatitis, unspecified cause: Secondary | ICD-10-CM

## 2021-07-11 MED ORDER — TRIAMCINOLONE ACETONIDE 0.1 % EX CREA: 1.0000 | TOPICAL_CREAM | Freq: Two times a day (BID) | CUTANEOUS | 0 refills | Status: DC | PRN

## 2021-07-11 NOTE — ED Triage Notes (Signed)
Patient presents to Urgent Care with complaints of tick bite back of knee x 12 days ago. Rash appeared 2 days ago. Treated tick bite with antibacterial ointment for the first two days.   Denies fever or chills.

## 2021-07-17 ENCOUNTER — Other Ambulatory Visit: Payer: Self-pay | Admitting: Internal Medicine

## 2021-07-17 DIAGNOSIS — G4700 Insomnia, unspecified: Secondary | ICD-10-CM

## 2021-07-17 MED ORDER — ZOLPIDEM TARTRATE ER 12.5 MG PO TBCR: 12.5000 mg | EXTENDED_RELEASE_TABLET | Freq: Every evening | ORAL | 2 refills | Status: DC | PRN

## 2021-07-28 ENCOUNTER — Other Ambulatory Visit: Payer: Self-pay | Admitting: Family

## 2021-07-28 DIAGNOSIS — G4700 Insomnia, unspecified: Secondary | ICD-10-CM

## 2021-07-30 DIAGNOSIS — M7501 Adhesive capsulitis of right shoulder: Secondary | ICD-10-CM | POA: Diagnosis not present

## 2021-07-31 ENCOUNTER — Other Ambulatory Visit: Payer: Self-pay | Admitting: Internal Medicine

## 2021-07-31 DIAGNOSIS — E1165 Type 2 diabetes mellitus with hyperglycemia: Secondary | ICD-10-CM

## 2021-08-02 ENCOUNTER — Other Ambulatory Visit: Payer: Self-pay | Admitting: Orthopedic Surgery

## 2021-08-20 ENCOUNTER — Encounter: Payer: Self-pay | Admitting: Orthopedic Surgery

## 2021-08-27 ENCOUNTER — Other Ambulatory Visit: Payer: Self-pay

## 2021-08-27 ENCOUNTER — Encounter
Admission: RE | Disposition: A | Discharge status: Home / Self Care | Payer: Self-pay | Source: Ambulatory Visit | Attending: Orthopedic Surgery

## 2021-08-27 ENCOUNTER — Ambulatory Visit
Admission: RE | Admit: 2021-08-27 | Discharge: 2021-08-27 | Disposition: A | Discharge status: Home / Self Care | Payer: BC Managed Care – PPO | Source: Ambulatory Visit | Attending: Orthopedic Surgery | Admitting: Orthopedic Surgery

## 2021-08-27 ENCOUNTER — Encounter: Payer: Self-pay | Admitting: Orthopedic Surgery

## 2021-08-27 ENCOUNTER — Ambulatory Visit: Payer: BC Managed Care – PPO | Admitting: Anesthesiology

## 2021-08-27 DIAGNOSIS — M7521 Bicipital tendinitis, right shoulder: Secondary | ICD-10-CM | POA: Diagnosis not present

## 2021-08-27 DIAGNOSIS — M7501 Adhesive capsulitis of right shoulder: Secondary | ICD-10-CM | POA: Diagnosis not present

## 2021-08-27 DIAGNOSIS — M7581 Other shoulder lesions, right shoulder: Secondary | ICD-10-CM | POA: Diagnosis not present

## 2021-08-27 DIAGNOSIS — Z9884 Bariatric surgery status: Secondary | ICD-10-CM | POA: Diagnosis not present

## 2021-08-27 DIAGNOSIS — I10 Essential (primary) hypertension: Secondary | ICD-10-CM | POA: Insufficient documentation

## 2021-08-27 DIAGNOSIS — K760 Fatty (change of) liver, not elsewhere classified: Secondary | ICD-10-CM | POA: Diagnosis not present

## 2021-08-27 DIAGNOSIS — G4730 Sleep apnea, unspecified: Secondary | ICD-10-CM | POA: Diagnosis not present

## 2021-08-27 DIAGNOSIS — S46011A Strain of muscle(s) and tendon(s) of the rotator cuff of right shoulder, initial encounter: Secondary | ICD-10-CM | POA: Diagnosis not present

## 2021-08-27 DIAGNOSIS — Z8619 Personal history of other infectious and parasitic diseases: Secondary | ICD-10-CM | POA: Insufficient documentation

## 2021-08-27 DIAGNOSIS — Z8616 Personal history of COVID-19: Secondary | ICD-10-CM | POA: Diagnosis not present

## 2021-08-27 DIAGNOSIS — M7541 Impingement syndrome of right shoulder: Secondary | ICD-10-CM | POA: Diagnosis not present

## 2021-08-27 DIAGNOSIS — R7303 Prediabetes: Secondary | ICD-10-CM | POA: Insufficient documentation

## 2021-08-27 DIAGNOSIS — E785 Hyperlipidemia, unspecified: Secondary | ICD-10-CM | POA: Insufficient documentation

## 2021-08-27 DIAGNOSIS — Z85828 Personal history of other malignant neoplasm of skin: Secondary | ICD-10-CM | POA: Diagnosis not present

## 2021-08-27 DIAGNOSIS — X58XXXA Exposure to other specified factors, initial encounter: Secondary | ICD-10-CM | POA: Diagnosis not present

## 2021-08-27 DIAGNOSIS — M7551 Bursitis of right shoulder: Secondary | ICD-10-CM | POA: Insufficient documentation

## 2021-08-27 HISTORY — DX: Polyneuropathy, unspecified: G62.9

## 2021-08-27 HISTORY — PX: LYSIS OF ADHESION: SHX5961

## 2021-08-27 HISTORY — DX: Other specified postprocedural states: Z98.890

## 2021-08-27 HISTORY — DX: Other specified postprocedural states: R11.2

## 2021-08-27 LAB — GLUCOSE, CAPILLARY
Glucose-Capillary: 75 mg/dL (ref 70–99)
Glucose-Capillary: 86 mg/dL (ref 70–99)

## 2021-08-27 SURGERY — LAPAROTOMY, FOR LYSIS OF ADHESIONS
Anesthesia: General | Site: Shoulder | Laterality: Right

## 2021-08-27 MED ORDER — ASPIRIN 325 MG PO TBEC: 325.0000 mg | DELAYED_RELEASE_TABLET | Freq: Every day | ORAL | 0 refills | Status: AC

## 2021-08-27 MED ORDER — OXYCODONE HCL 5 MG PO TABS
5.0000 mg | ORAL_TABLET | Freq: Once | ORAL | Status: AC
  Administered 2021-08-27: 5 mg via ORAL

## 2021-08-27 MED ORDER — DEXAMETHASONE SODIUM PHOSPHATE 4 MG/ML IJ SOLN
INTRAMUSCULAR | Status: DC | PRN
  Administered 2021-08-27: 4 mg via INTRAVENOUS

## 2021-08-27 MED ORDER — IBUPROFEN 800 MG PO TABS: 800.0000 mg | ORAL_TABLET | Freq: Three times a day (TID) | ORAL | 0 refills | Status: AC

## 2021-08-27 MED ORDER — LACTATED RINGERS IV SOLN: INTRAVENOUS | Status: DC | PRN

## 2021-08-27 MED ORDER — MIDAZOLAM HCL 2 MG/2ML IJ SOLN
2.0000 mg | Freq: Once | INTRAMUSCULAR | Status: AC
  Administered 2021-08-27: 1.5 mg via INTRAVENOUS

## 2021-08-27 MED ORDER — METOCLOPRAMIDE HCL 5 MG/ML IJ SOLN
10.0000 mg | Freq: Once | INTRAMUSCULAR | Status: AC
Start: 2021-08-27 — End: 2021-08-27
  Administered 2021-08-27: 10 mg via INTRAVENOUS

## 2021-08-27 MED ORDER — ONDANSETRON HCL 4 MG/2ML IJ SOLN
INTRAMUSCULAR | Status: DC | PRN
  Administered 2021-08-27: 4 mg via INTRAVENOUS

## 2021-08-27 MED ORDER — PROPOFOL 10 MG/ML IV BOLUS
INTRAVENOUS | Status: DC | PRN
  Administered 2021-08-27: 100 mg via INTRAVENOUS
  Administered 2021-08-27: 30 mg via INTRAVENOUS
  Administered 2021-08-27: 50 mg via INTRAVENOUS
  Administered 2021-08-27: 150 mg via INTRAVENOUS
  Administered 2021-08-27: 50 mg via INTRAVENOUS

## 2021-08-27 MED ORDER — BUPIVACAINE HCL (PF) 0.5 % IJ SOLN
INTRAMUSCULAR | Status: DC | PRN
  Administered 2021-08-27: 10 mL via PERINEURAL

## 2021-08-27 MED ORDER — LACTATED RINGERS IR SOLN
Status: DC | PRN
  Administered 2021-08-27: 4
  Administered 2021-08-27: 3000 mL

## 2021-08-27 MED ORDER — BUPIVACAINE LIPOSOME 1.3 % IJ SUSP
INTRAMUSCULAR | Status: DC | PRN
  Administered 2021-08-27: 20 mL via PERINEURAL

## 2021-08-27 MED ORDER — ONDANSETRON HCL 4 MG/2ML IJ SOLN
4.0000 mg | Freq: Once | INTRAMUSCULAR | Status: AC | PRN
  Administered 2021-08-27: 4 mg via INTRAVENOUS

## 2021-08-27 MED ORDER — ROPIVACAINE HCL 5 MG/ML IJ SOLN
INTRAMUSCULAR | Status: DC | PRN
  Administered 2021-08-27 (×2): 4 mL

## 2021-08-27 MED ORDER — ACETAMINOPHEN 500 MG PO TABS: 1000.0000 mg | ORAL_TABLET | Freq: Three times a day (TID) | ORAL | 2 refills | Status: DC

## 2021-08-27 MED ORDER — EPHEDRINE SULFATE (PRESSORS) 50 MG/ML IJ SOLN
INTRAMUSCULAR | Status: DC | PRN
  Administered 2021-08-27 (×2): 10 mg via INTRAVENOUS

## 2021-08-27 MED ORDER — PROPOFOL 500 MG/50ML IV EMUL
INTRAVENOUS | Status: DC | PRN
  Administered 2021-08-27: 140 ug/kg/min via INTRAVENOUS

## 2021-08-27 MED ORDER — PHENYLEPHRINE HCL (PRESSORS) 10 MG/ML IV SOLN
INTRAVENOUS | Status: DC | PRN
  Administered 2021-08-27: 100 ug via INTRAVENOUS
  Administered 2021-08-27 (×3): 50 ug via INTRAVENOUS

## 2021-08-27 MED ORDER — OXYCODONE HCL 5 MG PO TABS: 5.0000 mg | ORAL_TABLET | ORAL | 0 refills | Status: DC | PRN

## 2021-08-27 MED ORDER — LIDOCAINE HCL (CARDIAC) PF 100 MG/5ML IV SOSY
PREFILLED_SYRINGE | INTRAVENOUS | Status: DC | PRN
  Administered 2021-08-27: 60 mg via INTRAVENOUS

## 2021-08-27 MED ORDER — CEFAZOLIN SODIUM-DEXTROSE 2-4 GM/100ML-% IV SOLN
2.0000 g | INTRAVENOUS | Status: AC
  Administered 2021-08-27: 2 g via INTRAVENOUS

## 2021-08-27 MED ORDER — LACTATED RINGERS IV SOLN: INTRAVENOUS | Status: DC

## 2021-08-27 MED ORDER — SUCCINYLCHOLINE CHLORIDE 200 MG/10ML IV SOSY
PREFILLED_SYRINGE | INTRAVENOUS | Status: DC | PRN
  Administered 2021-08-27: 120 mg via INTRAVENOUS

## 2021-08-27 MED ORDER — DEXTROSE 50 % IV SOLN
12.5000 mL | Freq: Once | INTRAVENOUS | Status: AC
  Administered 2021-08-27: 12.5 mL via INTRAVENOUS

## 2021-08-27 MED ORDER — FENTANYL CITRATE PF 50 MCG/ML IJ SOSY: 25.0000 ug | PREFILLED_SYRINGE | INTRAMUSCULAR | Status: DC | PRN

## 2021-08-27 MED ORDER — ONDANSETRON 4 MG PO TBDP: 4.0000 mg | ORAL_TABLET | Freq: Three times a day (TID) | ORAL | 0 refills | Status: DC | PRN

## 2021-08-27 MED ORDER — SCOPOLAMINE 1 MG/3DAYS TD PT72
1.0000 | MEDICATED_PATCH | TRANSDERMAL | Status: DC
  Administered 2021-08-27: 1.5 mg via TRANSDERMAL

## 2021-08-27 SURGICAL SUPPLY — 52 items
ADAPTER IRRIG TUBE 2 SPIKE SOL (ADAPTER) ×4 IMPLANT
ADH SKN CLS APL DERMABOND .7 (GAUZE/BANDAGES/DRESSINGS) ×1
ADPR TBG 2 SPK PMP STRL ASCP (ADAPTER) ×2
ANCH SUT 2.9 PUSHLOCK ANCH (Orthopedic Implant) ×2 IMPLANT
APL PRP STRL LF DISP 70% ISPRP (MISCELLANEOUS) ×1
BLADE SHAVER 4.5X7 STR FR (MISCELLANEOUS) ×2 IMPLANT
BUR BR 5.5 WIDE MOUTH (BURR) ×2 IMPLANT
CANNULA PART THRD DISP 5.75X7 (CANNULA) ×2 IMPLANT
CANNULA PARTIAL THREAD 2X7 (CANNULA) ×2 IMPLANT
CHLORAPREP W/TINT 26 (MISCELLANEOUS) ×2 IMPLANT
COOLER POLAR GLACIER W/PUMP (MISCELLANEOUS) ×2 IMPLANT
COVER LIGHT HANDLE UNIVERSAL (MISCELLANEOUS) ×4 IMPLANT
DERMABOND ADVANCED (GAUZE/BANDAGES/DRESSINGS) ×2
DERMABOND ADVANCED .7 DNX12 (GAUZE/BANDAGES/DRESSINGS) ×1 IMPLANT
DRAPE INCISE IOBAN 66X45 STRL (DRAPES) ×2 IMPLANT
DRAPE U-SHAPE 48X52 POLY STRL (PACKS) ×2 IMPLANT
DRSG TEGADERM 4X4.75 (GAUZE/BANDAGES/DRESSINGS) ×6 IMPLANT
ELECT REM PT RETURN 9FT ADLT (ELECTROSURGICAL) ×2
ELECTRODE REM PT RTRN 9FT ADLT (ELECTROSURGICAL) ×1 IMPLANT
GAUZE SPONGE 4X4 12PLY STRL (GAUZE/BANDAGES/DRESSINGS) ×2 IMPLANT
GAUZE XEROFORM 1X8 LF (GAUZE/BANDAGES/DRESSINGS) ×2 IMPLANT
GLOVE SRG 8 PF TXTR STRL LF DI (GLOVE) ×3 IMPLANT
GLOVE SURG ENC MOIS LTX SZ7.5 (GLOVE) ×10 IMPLANT
GLOVE SURG UNDER POLY LF SZ8 (GLOVE) ×6
GOWN STRL REIN 2XL XLG LVL4 (GOWN DISPOSABLE) ×2 IMPLANT
GOWN STRL REUS W/ TWL LRG LVL3 (GOWN DISPOSABLE) ×3 IMPLANT
GOWN STRL REUS W/TWL LRG LVL3 (GOWN DISPOSABLE) ×6
IV LACTATED RINGER IRRG 3000ML (IV SOLUTION) ×10
IV LR IRRIG 3000ML ARTHROMATIC (IV SOLUTION) ×6 IMPLANT
KIT STABILIZATION SHOULDER (MISCELLANEOUS) ×2 IMPLANT
KIT TURNOVER KIT A (KITS) ×2 IMPLANT
MANIFOLD 4PT FOR NEPTUNE1 (MISCELLANEOUS) ×2 IMPLANT
MASK FACE SPIDER DISP (MASK) ×2 IMPLANT
MAT ABSORB  FLUID 56X50 GRAY (MISCELLANEOUS) ×4
MAT ABSORB FLUID 56X50 GRAY (MISCELLANEOUS) ×2 IMPLANT
PACK ARTHROSCOPY SHOULDER (MISCELLANEOUS) ×2 IMPLANT
PAD ABD DERMACEA PRESS 5X9 (GAUZE/BANDAGES/DRESSINGS) ×4 IMPLANT
PAD WRAPON POLAR SHDR XLG (MISCELLANEOUS) ×1 IMPLANT
SPONGE T-LAP 18X18 ~~LOC~~+RFID (SPONGE) ×1 IMPLANT
SUT ETHILON 3-0 (SUTURE) ×2 IMPLANT
SUT MNCRL 4-0 (SUTURE) ×2
SUT MNCRL 4-0 27XMFL (SUTURE) ×1
SUT VIC AB 0 CT1 36 (SUTURE) ×2 IMPLANT
SUT VIC AB 2-0 CT2 27 (SUTURE) ×2 IMPLANT
SUTURE MNCRL 4-0 27XMF (SUTURE) ×1 IMPLANT
SYSTEM IMPL TENODESIS LNT 2.9 (Orthopedic Implant) ×2 IMPLANT
TAPE MICROFOAM 4IN (TAPE) ×2 IMPLANT
TUBING CONNECTING 10 (TUBING) ×2 IMPLANT
TUBING INFLOW SET DBFLO PUMP (TUBING) ×2 IMPLANT
TUBING OUTFLOW SET DBLFO PUMP (TUBING) ×2 IMPLANT
WAND WEREWOLF FLOW 90D (MISCELLANEOUS) ×2 IMPLANT
WRAPON POLAR PAD SHDR XLG (MISCELLANEOUS) ×2

## 2021-08-27 NOTE — Discharge Instructions (Signed)
Post-Op Instructions - Shoulder Capsular Release/Manipulation Under Anesthesia  1. Bracing: You should wear a sling for comfort only. Sling should NOT be worn longer than ~1 week.   2. Driving: No driving for 1 week post-op. Must be off narcotic pain medication.  3. Activity: No active lifting for ~2 weeks. Perform range of motion exercises DAILY at home and with physical therapy as prescribed.   4. Physical Therapy: This should to begin the day after surgery, and proceed ~6-12 weeks. This should be at least 3x/week.   5. Medications:  - You will be provided a prescription for narcotic pain medicine. After surgery, take 1-2 narcotic tablets every 4 hours if needed for severe pain.  - A prescription for anti-nausea medication will be provided in case the narcotic medicine causes nausea - take 1 tablet every 6 hours only if nauseated.   - Take tylenol 1000 mg (2 Extra Strength tablets or 3 regular strength) every 8 hours for pain.  May decrease or stop tylenol 5 days after surgery if you are having minimal pain. - Take ibuprofen '800mg'$  three times/day with food for at least two weeks every day. This will help reduce post-operative inflammation and swelling. Please call our offices if this causes any stomach/GI irritation.  - Take Aspirin '325mg'$ /daily x 2 weeks to help prevent DVT/PE (Blood clots)   If you are taking prescription medication for anxiety, depression, insomnia, muscle spasm, chronic pain, or for attention deficit disorder, you are advised that you are at a higher risk of adverse effects with use of narcotics post-op, including narcotic addiction/dependence, depressed breathing, death. If you use non-prescribed substances: alcohol, marijuana, cocaine, heroin, methamphetamines, etc., you are at a higher risk of adverse effects with use of narcotics post-op, including narcotic addiction/dependence, depressed breathing, death. You are advised that taking > 50 morphine milligram equivalents  (MME) of narcotic pain medication per day results in twice the risk of overdose or death. For your prescription provided: oxycodone 5 mg - taking more than 6 tablets per day would result in > 50 morphine milligram equivalents (MME) of narcotic pain medication. Be advised that we will prescribe narcotics short-term, for acute post-operative pain only - 3 weeks for major operations such as shoulder repair/reconstruction surgeries.    6. Post-Op Appointment:  Your first post-op appointment will be ~2 weeks post-op.  7. Work or School: For most, but not all procedures, we advise staying out of work or school for at least 1 to 2 weeks in order to recover from the stress of surgery and to allow time for healing.   If you need a work or school note this can be provided.

## 2021-08-27 NOTE — Transfer of Care (Signed)
Immediate Anesthesia Transfer of Care Note  Patient: Kristin [name]  Procedure(s) Performed: Right shoulder arthroscopic lysis of adhesions, capsular release, and manipulation under anesthesia with corticosteroid injection with possible biceps tenodesis (Right: Shoulder) Right shoulder arthroscopic lysis of adhesions, capsular release, and manipulation under anesthesia with corticosteroid injection with possible biceps tenodesis (Right: Shoulder)  Patient Location: PACU  Anesthesia Type: General ETT  Level of Consciousness: awake, alert  and patient cooperative  Airway and Oxygen Therapy: Patient Spontanous Breathing and Patient connected to supplemental oxygen  Post-op Assessment: Post-op Vital signs reviewed, Patient's Cardiovascular Status Stable, Respiratory Function Stable, Patent Airway and No signs of Nausea or vomiting  Post-op Vital Signs: Reviewed and stable  Complications: No notable events documented.

## 2021-08-27 NOTE — Op Note (Signed)
OPERATIVE NOTE SURGERY DATE: 08/27/2021  PRE-OP DIAGNOSIS: 1. Right shoulder adhesive capsulitis and synovitis 2. Right subacromial bursitis 3. Right shoulder adhesions 4. Right shoulder biceps tendinopathy  POST-OP DIAGNOSIS:  1. Right shoulder adhesive capsulitis and synovitis 2. Right subacromial bursitis 3. Right shoulder adhesions 4. Right shoulder biceps tendinopathy 5. Right shoulder rotator cuff tear  PROCEDURES: 1. Right shoulder capsular releases with synovectomy, lysis of adhesions, manipulation under anesthesia  2. Right subacromial decompression 3. Right glenohumeral and subacromial injections with corticosteroid 4. Right shoulder arthroscopic biceps tenodesis  SURGEON:  Kristin Mulligan, MD  ASSISTANT(S):  none  ANESTHESIA: Regional block with Exparel, Gen  TOTAL IV FLUIDS: per anesthesia record   ESTIMATED BLOOD LOSS: Minimal  DRAINS:  None.  SPECIMENS: None  IMPLANTS:  Arthrex 2.96m PushLock x 1  COMPLICATIONS: none  INDICATIONS: Kristin [name] is a 61y.o. female with complaints of right shoulder pain and stiffness. Preoperative right shoulder examination was notable for severe motion loss and pain. The patient has had prior adhesive capsulitis that has resolved with corticosteroid injections, but has had multiple recurrences now.  Patient elected to undergo surgery consisting of capsular releases, manipulation under anesthesia, and subacromial decompression/bursectomy with corticosteroid injections into glenohumeral joint and subacromial space, and possible biceps tenodesis.  Prior MRI from November 2022 showed partial-thickness rotator cuff tear involving less than 50% of the bursal side and biceps tendinopathy.  After discussion of risks, benefits, and alternatives to surgery, the patient elected to proceed.    OPERATIVE FINDINGS:  Operative Shoulder Range of Motion:  Preop  Postop  Flexion  110 150  Abduction  70 120  ER at 0  30 70  ER at  90  60 100  IR at 90  10 50  IR posterior  T12 T6    Intra-operative findings: A thorough arthroscopic examination of the shoulder was performed.  The findings are: 1. Biceps tendon:  Significant tendinopathy along the intra-articular portion 2. Superior labrum: injected with surrounding synovitis with type II SLAP tear 3. Posterior labrum and capsule: Significant synovitis and thickening about the capsule; normal posterior labrum 4. Inferior capsule and inferior recess: Significant synovitis and thickening of capsule 5. Glenoid cartilage surface: Grade 1 changes  6. Supraspinatus attachment: Full-thickness tear with minimal retraction 7. Posterior rotator cuff attachment:  Normal 8. Humeral head articular cartilage: normal 9. Rotator interval: significant synovitis and thickening of capsule 10: Subscapularis tendon:  Normal 11. Anterior labrum: Normal 12. IGHL: significant synovitis around IGHL   DETAILS OF PROCEDURE: The patient was identified in the preoperative holding area. Informed consent was obtained. Operative extremity was marked. After satisfactory upper extremity regional block with Exparel was performed in the preoperative holding area, the patient was brought to the operating room and placed in a well-padded beach chair positioner.  Eyes were protected, head was affixed in neutral, and the patient was given preoperative IV antibiotics within 30 minutes of the start of the case and a surgical time-out occurred. The upper extremity was prescrubbed with alcohol, prepped with ChloraPrep and draped in the usual sterile fashion.    I then created a standard posterior portal with an 11 blade. The glenohumeral joint was easily entered with a blunt trochar and the arthroscope introduced. The findings of diagnostic arthroscopy are described above.  A standard anterior portal was made.  The joint was remarkable for severe synovitis which was chronic in the anterior, superior, posterior,  and inferior aspects. This required synovectomy with a shaver and  Arthrocare device in the affected compartments listed above.  A combination of electrocautery and oscillating shaver was used to debride the rotator interval tissue.  The posterior aspect of the coracoid was exposed.  The anterior and posterior aspects of the subscapularis were cleared of tissue so there was no tethering.    Given the pathology of the biceps tendon and the presence of a SLAP tear, arthroscopic biceps tenodesis was performed such that the biceps would be taken off of tension and there would be no postoperative restrictions. The Loop n Tack technique was used to pass a FiberTape through the biceps in a locked fashion adjacent to the biceps anchor.  A hole for a 2.9 mm Arthrex PushLock was drilled in the bicipital groove just superior to the subscapularis tendon insertion.  The biceps tendon was then cut and the biceps anchor complex was debrided down to a stable base on the superior labrum.  The FiberTape was loaded onto the PushLock anchor and impacted into place into the previously drilled hole in the bicipital groove.  This appropriately secured the biceps into the bicipital groove and took it off of tension.   Next, an upbiting duckbill basket was then used to perform a capsulotomy of the rotator interval and then the MGHL and the IGHL (anterior band).  Care was taken to protect the intraarticular subscapularis.  Adhesions were cleared off the subscapularis to allow full internal and external rotation.  The arthroscope was then placed into the anterior portal.  The posterior capsule was quite thickened and inflamed as well.  After synovectomy, the duckbill basket was used to perform release from the superior glenoid, down into the axillary pouch, around to the anterior band of the IGHL.  A complete 360 capsulotomy was performed in this manner.  Care was taken to protect the axillary nerve by staying on the glenoid side and  making sure not to rotate the shoulder externally during the capsulotomy.  Hemostasis was achieved with the ArthroCare wand.  There was no unusual bleeding.  The instruments were removed from the joint.    The arthroscope was placed in the subacromial space. An accessory lateral portal was established. There was severe scar and chronic bursitis filling the whole subacromial space and gutters.  A complete subacromial bursectomy and debridement of the gutters was carried out with a shaver.  ArthroCare was used to control bleeding.  There was a prominent subacromial spur consistent with type III acromion.  A burr was utilized to create a flat acromion converting this to a type I acromion.  Care was taken to leave the deltoid fascia anteriorly intact.  Bony debris was removed from the joint.  Of note, a high-grade bursal sided tear of the supraspinatus was noted.  This involved approximately 90% of the footprint in most areas and there was 1 small region of full-thickness tearing as well.  Given primary concern for adhesive capsulitis, rotator cuff was not repaired given necessary rehab protocol.  Arthroscopic fluid was removed from the joint after obtaining appropriate hemostasis.  Manipulation under anesthesia was then carried out in a gentle, controlled manner with short lever arms.  There was palpable release of adhesions. See above chart for post-manipulation improvement in range of motion.   The skin was closed with interrupted 3-0 nylon sutures. Injections of 40 mg Kenalog with 0.5% ropivacaine were placed separately in the glenohumeral joint and subacromial space with a spinal needle.  Xeroform gauze, sterile dressings were applied. The patient was placed in a  shoulder sling.  Polar Care was applied.    Instrument, sponge, and needle counts were correct prior to closure and at the conclusion of the case.   DISPOSITION: PACU - hemodynamically stable.  POSTOPERATIVE PLAN: The patient will be  discharged home. PT to begin 1 day postop for range of motion exercises.  ASA x 2 weeks for DVT ppx. Sling only for comfort and wean this week as soon as tolerated. RTC in ~1 week.

## 2021-08-27 NOTE — H&P (Signed)
Paper H&P to be scanned into permanent record. H&P reviewed. No significant changes noted.  

## 2021-08-27 NOTE — Anesthesia Preprocedure Evaluation (Signed)
Anesthesia Evaluation  Patient identified by MRN, date of birth, ID band Patient awake    Reviewed: Allergy & Precautions, H&P , NPO status , Patient's Chart, lab work & pertinent test results, reviewed documented beta blocker date and time   History of Anesthesia Complications (+) PONV and history of anesthetic complications  Airway Mallampati: II  TM Distance: >3 FB Neck ROM: full    Dental  (+) Teeth Intact   Pulmonary sleep apnea and Continuous Positive Airway Pressure Ventilation ,    Pulmonary exam normal        Cardiovascular Exercise Tolerance: Poor hypertension, Pt. on medications and On Medications negative cardio ROS Normal cardiovascular exam Rhythm:regular Rate:Normal     Neuro/Psych  Headaches, Anxiety Depression  Neuromuscular disease negative psych ROS   GI/Hepatic negative GI ROS, Neg liver ROS, hiatal hernia, GERD  ,  Endo/Other  negative endocrine ROSdiabetes, Well Controlled  Renal/GU negative Renal ROS  negative genitourinary   Musculoskeletal   Abdominal   Peds  Hematology negative hematology ROS (+)   Anesthesia Other Findings Past Medical History: No date: Allergy No date: Arthritis No date: Basal cell carcinoma     Comment:  nose, left cheek, right upper lip, right posterior               shoulder most recent 6 years ago on left cheek 03/26/2021: Basal cell carcinoma     Comment:  L chest, EDC 04/24/2021 No date: Bleeding No date: Chicken pox No date: Complication of anesthesia     Comment:  blood pressure elevated after gastric sleeve surgery in               recovery, pt does not want mask over face; migraines No date: COVID-19     Comment:  03/01/20 had MAB 03/03/20 and felt better next day 03/04/20 No date: Depression No date: Diabetes mellitus without complication (HCC)     Comment:  pre-diabetic currently, was diabetic prior to gastric               sleeve surgery  No date:  Disorder of right rotator cuff     Comment:  10/03/19 MRI right shoulder abnormal Dr. Maurine Cane No date: Fall     Comment:  08/2020 No date: Fall     Comment:  03/28/21 No date: Fatty liver No date: Gout 10/15/2018: Hematuria 06/19/2021: History of shingles     Comment:  lower back and R side of thigh No date: Hyperlipidemia No date: Hypertension     Comment:  no problems since losing weight after gastric sleeve               surgery No date: IBS (irritable bowel syndrome) No date: Insomnia No date: Migraine No date: Neuropathy     Comment:  bilateral feet No date: Obesity No date: Osteopenia No date: PONV (postoperative nausea and vomiting) 03/26/2021: Squamous cell carcinoma of skin     Comment:  SCC IS R mid sternum, [facility] 04/24/2021 Past Surgical History: No date: ABDOMINAL HYSTERECTOMY     Comment:  no h/o abnormal pap still has 1 ovary left 05/29/2016: ANKLE ARTHROSCOPY; Left     Comment:  Procedure: ANKLE ARTHROSCOPY;  Surgeon: [name],               DPM;  Location: [place];  Service: Podiatry;  Laterality:               Left; No date: APPENDECTOMY No date: basal cell  removed No date: CESAREAN SECTION; N/A     Comment:  [place] No date: CHOLECYSTECTOMY 01/31/2019: COLONOSCOPY WITH PROPOFOL; N/A     Comment:  Procedure: COLONOSCOPY WITH PROPOFOL;  Surgeon: Lin Landsman, MD;  Location: [facility];  Service:               Gastroenterology;  Laterality: N/A; No date: cyst removed from wrist 01/31/2019: ESOPHAGOGASTRODUODENOSCOPY (EGD) WITH PROPOFOL; N/A     Comment:  Procedure: ESOPHAGOGASTRODUODENOSCOPY (EGD) WITH               PROPOFOL;  Surgeon: Lin Landsman, MD;  Location:               [place];  Service: Gastroenterology;  Laterality:               N/A; 05/21/2020: FLEXIBLE SIGMOIDOSCOPY; N/A     Comment:  Procedure: FLEXIBLE SIGMOIDOSCOPY;  Surgeon: Lin Landsman, MD;  Location: [facility];   Service:               Gastroenterology;  Laterality: N/A; No date: FOOT SURGERY     Comment:  left foot 11/2016 Dr. Laymond Purser  No date: left elbow surgery     Comment:  07/2017 Dr Leanor Kail epicondylitis  No date: MOHS SURGERY     Comment:  x 2 face nose and left cheek  05/29/2016: OSTEOCHONDROMA EXCISION; Left     Comment:  Procedure: OSTEOCHONDRAL DRILLING TALUS;  Surgeon:               [name], DPM;  Location: [facility];  Service:               Podiatry;  Laterality: Left; 05/29/2016: PLANTAR FASCIA RELEASE; Left     Comment:  Procedure: ENDOSCOPIC PLANTAR FASCIOTOMY;  Surgeon:               [name], DPM;  Location: [place];  Service:               Podiatry;  Laterality: Left; No date: ROTATOR CUFF REPAIR; Left 2017: STOMACH SURGERY     Comment:  gastric sleeve surgery 08/05/2017: TENNIS ELBOW RELEASE/NIRSCHEL PROCEDURE; Left     Comment:  Procedure: TENNIS ELBOW RELEASE/NIRSCHEL PROCEDURE;                Surgeon: Leanor Kail, MD;  Location: [facility];                Service: Orthopedics;  Laterality: Left; No date: TUBAL LIGATION 1982: wedge resection of ovary BMI    Body Mass Index: 21.58 kg/m     Reproductive/Obstetrics negative OB ROS                             Anesthesia Physical Anesthesia Plan  ASA: 3  Anesthesia Plan: General ETT   Post-op Pain Management: Regional block   Induction:   PONV Risk Score and Plan: 4 or greater  Airway Management Planned:   Additional Equipment:   Intra-op Plan:   Post-operative Plan:   Informed Consent: I have reviewed the patients History and Physical, chart, labs and discussed the procedure including the risks, benefits and alternatives for the proposed anesthesia with the patient or authorized representative who  has indicated his/her understanding and acceptance.     Dental Advisory Given  Plan Discussed with: CRNA  Anesthesia Plan Comments:         Anesthesia  Quick Evaluation

## 2021-08-27 NOTE — Anesthesia Procedure Notes (Addendum)
Procedure Name: Intubation Date/Time: 08/27/2021 1:15 PM  Performed by: Tollie Eth, CRNAPre-anesthesia Checklist: Patient identified, Patient being monitored, Timeout performed, Emergency Drugs available and Suction available Patient Re-evaluated:Patient Re-evaluated prior to induction Oxygen Delivery Method: Circle system utilized Preoxygenation: Pre-oxygenation with 100% oxygen Induction Type: Rapid sequence and IV induction Laryngoscope Size: Mac and 3 Grade View: Grade I Tube type: Oral Tube size: 6.5 mm Number of attempts: 1 Airway Equipment and Method: Stylet Placement Confirmation: ETT inserted through vocal cords under direct vision, positive ETCO2 and breath sounds checked- equal and bilateral Secured at: 21 cm Tube secured with: Tape Dental Injury: Teeth and Oropharynx as per pre-operative assessment

## 2021-08-27 NOTE — Anesthesia Procedure Notes (Signed)
Anesthesia Regional Block: Interscalene brachial plexus block   Pre-Anesthetic Checklist: , timeout performed,  Correct Patient, Correct Site, Correct Laterality,  Correct Procedure, Correct Position, site marked,  Risks and benefits discussed,  Surgical consent,  Pre-op evaluation,  At surgeon's request and post-op pain management  Laterality: Right and Upper  Prep: chloraprep       Needles:  Injection technique: Single-shot  Needle Type: Echogenic Stimulator Needle      Needle Gauge: 20     Additional Needles:   Procedures:,,,, ultrasound used (permanent image in chart),,    Narrative:  Start time: 08/27/2021 12:21 PM End time: 08/27/2021 12:27 PM  Performed by: Personally  Anesthesiologist: Molli Barrows, MD  Additional Notes: Pt. Identified and accepting of procedure after risks and benefits fully reviewed and questions answered. Time out performed and laterality confirmed prior to procedure.  ISNB  performed without difficulty and well tolerated.  Neg IV and SATD.  No pain on injection of Local anesthetic and VSST.

## 2021-08-28 ENCOUNTER — Encounter: Payer: Self-pay | Admitting: Orthopedic Surgery

## 2021-08-28 DIAGNOSIS — M6281 Muscle weakness (generalized): Secondary | ICD-10-CM | POA: Diagnosis not present

## 2021-08-28 DIAGNOSIS — M25611 Stiffness of right shoulder, not elsewhere classified: Secondary | ICD-10-CM | POA: Diagnosis not present

## 2021-08-28 DIAGNOSIS — G8929 Other chronic pain: Secondary | ICD-10-CM | POA: Diagnosis not present

## 2021-08-28 DIAGNOSIS — M25511 Pain in right shoulder: Secondary | ICD-10-CM | POA: Diagnosis not present

## 2021-08-30 DIAGNOSIS — M25511 Pain in right shoulder: Secondary | ICD-10-CM | POA: Diagnosis not present

## 2021-08-30 DIAGNOSIS — G8929 Other chronic pain: Secondary | ICD-10-CM | POA: Diagnosis not present

## 2021-09-03 ENCOUNTER — Other Ambulatory Visit: Payer: Self-pay | Admitting: Gastroenterology

## 2021-09-03 DIAGNOSIS — M25511 Pain in right shoulder: Secondary | ICD-10-CM | POA: Diagnosis not present

## 2021-09-03 DIAGNOSIS — G8929 Other chronic pain: Secondary | ICD-10-CM | POA: Diagnosis not present

## 2021-09-06 DIAGNOSIS — M25511 Pain in right shoulder: Secondary | ICD-10-CM | POA: Diagnosis not present

## 2021-09-06 DIAGNOSIS — G8929 Other chronic pain: Secondary | ICD-10-CM | POA: Diagnosis not present

## 2021-09-09 DIAGNOSIS — M25511 Pain in right shoulder: Secondary | ICD-10-CM | POA: Diagnosis not present

## 2021-09-09 DIAGNOSIS — G8929 Other chronic pain: Secondary | ICD-10-CM | POA: Diagnosis not present

## 2021-09-09 NOTE — Anesthesia Postprocedure Evaluation (Signed)
Anesthesia Post Note  Patient: Kenasia Scheller Welden  Procedure(s) Performed: Right shoulder arthroscopic lysis of adhesions, capsular release, and manipulation under anesthesia with corticosteroid injection and biceps tenodesis (Right: Shoulder)     Patient location during evaluation: PACU Anesthesia Type: General Level of consciousness: awake and alert Pain management: pain level controlled Vital Signs Assessment: post-procedure vital signs reviewed and stable Respiratory status: spontaneous breathing, nonlabored ventilation, respiratory function stable and patient connected to nasal cannula oxygen Cardiovascular status: blood pressure returned to baseline and stable Postop Assessment: no apparent nausea or vomiting Anesthetic complications: no   No notable events documented.  Molli Barrows

## 2021-09-12 DIAGNOSIS — G8929 Other chronic pain: Secondary | ICD-10-CM | POA: Diagnosis not present

## 2021-09-12 DIAGNOSIS — M25511 Pain in right shoulder: Secondary | ICD-10-CM | POA: Diagnosis not present

## 2021-09-17 DIAGNOSIS — G8929 Other chronic pain: Secondary | ICD-10-CM | POA: Diagnosis not present

## 2021-09-17 DIAGNOSIS — M25511 Pain in right shoulder: Secondary | ICD-10-CM | POA: Diagnosis not present

## 2021-09-20 ENCOUNTER — Other Ambulatory Visit: Payer: Self-pay | Admitting: Internal Medicine

## 2021-09-20 DIAGNOSIS — E1165 Type 2 diabetes mellitus with hyperglycemia: Secondary | ICD-10-CM

## 2021-09-25 DIAGNOSIS — R41840 Attention and concentration deficit: Secondary | ICD-10-CM | POA: Diagnosis not present

## 2021-09-26 DIAGNOSIS — R41840 Attention and concentration deficit: Secondary | ICD-10-CM | POA: Diagnosis not present

## 2021-09-26 DIAGNOSIS — F419 Anxiety disorder, unspecified: Secondary | ICD-10-CM | POA: Diagnosis not present

## 2021-09-27 DIAGNOSIS — G8929 Other chronic pain: Secondary | ICD-10-CM | POA: Diagnosis not present

## 2021-09-27 DIAGNOSIS — M25511 Pain in right shoulder: Secondary | ICD-10-CM | POA: Diagnosis not present

## 2021-09-29 ENCOUNTER — Other Ambulatory Visit: Payer: Self-pay | Admitting: Gastroenterology

## 2021-09-30 DIAGNOSIS — F902 Attention-deficit hyperactivity disorder, combined type: Secondary | ICD-10-CM | POA: Diagnosis not present

## 2021-09-30 DIAGNOSIS — M25511 Pain in right shoulder: Secondary | ICD-10-CM | POA: Diagnosis not present

## 2021-09-30 DIAGNOSIS — G8929 Other chronic pain: Secondary | ICD-10-CM | POA: Diagnosis not present

## 2021-10-01 ENCOUNTER — Ambulatory Visit (INDEPENDENT_AMBULATORY_CARE_PROVIDER_SITE_OTHER): Payer: BC Managed Care – PPO | Admitting: Dermatology

## 2021-10-01 DIAGNOSIS — Z85828 Personal history of other malignant neoplasm of skin: Secondary | ICD-10-CM | POA: Diagnosis not present

## 2021-10-01 DIAGNOSIS — L570 Actinic keratosis: Secondary | ICD-10-CM

## 2021-10-01 DIAGNOSIS — D045 Carcinoma in situ of skin of trunk: Secondary | ICD-10-CM

## 2021-10-01 DIAGNOSIS — L821 Other seborrheic keratosis: Secondary | ICD-10-CM | POA: Diagnosis not present

## 2021-10-01 DIAGNOSIS — Z872 Personal history of diseases of the skin and subcutaneous tissue: Secondary | ICD-10-CM

## 2021-10-01 DIAGNOSIS — L820 Inflamed seborrheic keratosis: Secondary | ICD-10-CM | POA: Diagnosis not present

## 2021-10-01 DIAGNOSIS — L578 Other skin changes due to chronic exposure to nonionizing radiation: Secondary | ICD-10-CM | POA: Diagnosis not present

## 2021-10-01 NOTE — Patient Instructions (Addendum)

## 2021-10-01 NOTE — Progress Notes (Signed)
Follow-Up Visit   Subjective  Kristin [name] is a 61 y.o. female who presents for the following: Follow-up.  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 Recheck Inflamed SK vs BCC of the left popliteal and Hypertrophic AK vs Inflamed SK of the left lower clavicle, treated with cryotherapy. Patient also has a spot on her left buttock, present for several months, growing slightly. AK of the left thenar hand not treated at last visit per patient request, still present today. She has growths behind the right ear, no symptoms.  The following portions of the chart were reviewed this encounter and updated as appropriate:       Review of Systems:  No other skin or systemic complaints except as noted in HPI or Assessment and Plan.  Objective  Well appearing patient in no apparent distress; mood and affect are within normal limits.  A focused examination was performed including face, chest, legs, buttock. Relevant physical exam findings are noted in the Assessment and Plan.  L thenar hand x 1 Pink scaly macule  R med cheek x 1 Erythematous stuck-on, waxy papule or plaque    Assessment & Plan  Actinic Damage - chronic, secondary to cumulative UV radiation exposure/sun exposure over time - diffuse scaly erythematous macules with underlying dyspigmentation - Recommend daily broad spectrum sunscreen SPF 30+ to sun-exposed areas, reapply every 2 hours as needed.  - Recommend staying in the shade or wearing long sleeves, sun glasses (UVA+UVB protection) and wide brim hats (4-inch brim around the entire circumference of the hat). - Call for new or changing lesions.  History of Basal Cell Carcinoma of the Skin - No evidence of recurrence today of the left chest - Recommend regular full body skin exams - Recommend daily broad spectrum sunscreen SPF 30+ to sun-exposed areas, reapply every 2 hours as needed.  - Call if  any new or changing lesions are noted between office visits  History of Squamous Cell Carcinoma in Situ of the Skin - No evidence of recurrence today of the R mid sternum - Recommend regular full body skin exams - Recommend daily broad spectrum sunscreen SPF 30+ to sun-exposed areas, reapply every 2 hours as needed.  - Call if any new or changing lesions are noted between office visits  Seborrheic Keratoses - Stuck-on, waxy, tan-brown papules and/or plaques, including right postauricular, chest, buttock - Benign-appearing - Discussed benign etiology and prognosis. - Observe - Call for any changes  AK (actinic keratosis) L thenar hand x 1  Actinic keratoses are precancerous spots that appear secondary to cumulative UV radiation exposure/sun exposure over time. They are chronic with expected duration over 1 year. A portion of actinic keratoses will progress to squamous cell carcinoma of the skin. It is not possible to reliably predict which spots will progress to skin cancer and so treatment is recommended to prevent development of skin cancer.  Recommend daily broad spectrum sunscreen SPF 30+ to sun-exposed areas, reapply every 2 hours as needed.  Recommend staying in the shade or wearing long sleeves, sun glasses (UVA+UVB protection) and wide brim hats (4-inch brim around the entire circumference of the hat). Call for new or changing lesions.  Destruction of lesion - L thenar hand x 1  Destruction method: cryotherapy   Informed consent: discussed and consent obtained   Lesion destroyed using liquid nitrogen: Yes   Region frozen until ice ball extended beyond  lesion: Yes   Outcome: patient tolerated procedure well with no complications   Post-procedure details: wound care instructions given   Additional details:  Prior to procedure, discussed risks of blister formation, small wound, skin dyspigmentation, or rare scar following cryotherapy. Recommend Vaseline ointment to treated areas  while healing.   Inflamed seborrheic keratosis R med cheek x 1  Symptomatic, irritating, patient would like treated.  Destruction of lesion - R med cheek x 1  Destruction method: cryotherapy   Informed consent: discussed and consent obtained   Lesion destroyed using liquid nitrogen: Yes   Region frozen until ice ball extended beyond lesion: Yes   Outcome: patient tolerated procedure well with no complications   Post-procedure details: wound care instructions given   Additional details:  Prior to procedure, discussed risks of blister formation, small wound, skin dyspigmentation, or rare scar following cryotherapy. Recommend Vaseline ointment to treated areas while healing.    History of PreCancerous Actinic Keratosis  - site(s) of PreCancerous Actinic Keratosis clear today. - these may recur and new lesions may form requiring treatment to prevent transformation into skin cancer - observe for new or changing spots and contact [place] for appointment if occur - photoprotection with sun protective clothing; sunglasses and broad spectrum sunscreen with SPF of at least 30 + and frequent self skin exams recommended - yearly exams by a dermatologist recommended for persons with history of PreCancerous Actinic Keratoses  Return in about 6 months (around 04/01/2022) for TBSE, Hx SCC, Hx BCC.  IJamesetta Orleans, CMA, am acting as scribe for Brendolyn Patty, MD .  Documentation: I have reviewed the above documentation for accuracy and completeness, and I agree with the above.  Brendolyn Patty MD

## 2021-10-02 ENCOUNTER — Encounter: Payer: Self-pay | Admitting: Internal Medicine

## 2021-10-02 ENCOUNTER — Ambulatory Visit (INDEPENDENT_AMBULATORY_CARE_PROVIDER_SITE_OTHER): Payer: BC Managed Care – PPO | Admitting: Internal Medicine

## 2021-10-02 VITALS — BP 110/60 | HR 70 | Temp 97.6°F | Ht 62.0 in | Wt 116.2 lb

## 2021-10-02 DIAGNOSIS — L659 Nonscarring hair loss, unspecified: Secondary | ICD-10-CM

## 2021-10-02 DIAGNOSIS — G43011 Migraine without aura, intractable, with status migrainosus: Secondary | ICD-10-CM

## 2021-10-02 DIAGNOSIS — F419 Anxiety disorder, unspecified: Secondary | ICD-10-CM | POA: Diagnosis not present

## 2021-10-02 DIAGNOSIS — T148XXA Other injury of unspecified body region, initial encounter: Secondary | ICD-10-CM

## 2021-10-02 DIAGNOSIS — Z23 Encounter for immunization: Secondary | ICD-10-CM

## 2021-10-02 DIAGNOSIS — S51811A Laceration without foreign body of right forearm, initial encounter: Secondary | ICD-10-CM | POA: Diagnosis not present

## 2021-10-02 MED ORDER — MUPIROCIN 2 % EX OINT: 1.0000 | TOPICAL_OINTMENT | Freq: Two times a day (BID) | CUTANEOUS | 0 refills | Status: DC

## 2021-10-02 MED ORDER — AMITRIPTYLINE HCL 25 MG PO TABS: 25.0000 mg | ORAL_TABLET | Freq: Every day | ORAL | 3 refills | Status: DC

## 2021-10-02 NOTE — Patient Instructions (Addendum)
Nutrafol for hair loss serum, collagen and pills  Nature made ashwaghana for anxiety    Colonoscopy due 01/2024  Age 61 consider 1 more pap smear   Calm magnesium powder and pill 250 mg daily   Virtual visit for psychiatry for anxiety  [facility] as given the info before for both  [facility] counseling and psychiatry [place]  [place] [id number] [phone]    [facility] [place] * [address] [place]  [address]  [phone]    Call pharmacy and sch shingrix vaccine before 01/16/22  Prevnar 20 consider 01/2021  Consider another covid shot late 09/2021   Pneumococcal Conjugate Vaccine (Prevnar 20) Suspension for Injection What is this medication? PNEUMOCOCCAL VACCINE (NEU mo KOK al vak SEEN) is a vaccine. It prevents pneumococcus bacterial infections. These bacteria can cause serious infections like pneumonia, meningitis, and blood infections. This vaccine will not treat an infection and will not cause infection. This vaccine is recommended for adults 18 years and older. This medicine may be used for other purposes; ask your health care provider or pharmacist if you have questions. COMMON BRAND NAME(S): Prevnar 20 What should I tell my care team before I take this medication? They need to know if you have any of these conditions: bleeding disorder fever immune system problems an unusual or allergic reaction to pneumococcal vaccine, diphtheria toxoid, other vaccines, other medicines, foods, dyes, or preservatives pregnant or trying to get pregnant breast-feeding How should I use this medication? This vaccine is injected into a muscle. It is given by a health care provider. A copy of Vaccine Information Statements will be given before each vaccination. Be sure to read this information carefully each time. This sheet may change often. Talk to your health care provider about the use of this medicine in  children. Special care may be needed. Overdosage: If you think you have taken too much of this medicine contact a poison control center or emergency room at once. NOTE: This medicine is only for you. Do not share this medicine with others. What if I miss a dose? This does not apply. This medicine is not for regular use. What may interact with this medication? medicines for cancer chemotherapy medicines that suppress your immune function steroid medicines like prednisone or cortisone This list may not describe all possible interactions. Give your health care provider a list of all the medicines, herbs, non-prescription drugs, or dietary supplements you use. Also tell them if you smoke, drink alcohol, or use illegal drugs. Some items may interact with your medicine. What should I watch for while using this medication? Mild fever and pain should go away in 3 days or less. Report any unusual symptoms to your health care provider. What side effects may I notice from receiving this medication? Side effects that you should report to your doctor or health care professional as soon as possible: allergic reactions (skin rash, itching or hives; swelling of the face, lips, or tongue) confusion fast, irregular heartbeat fever over 102 degrees F muscle weakness seizures trouble breathing unusual bruising or bleeding Side effects that usually do not require medical attention (report to your doctor or health care professional if they continue or are bothersome): fever of 102 degrees F or less headache joint pain muscle cramps, pain pain, tender at site where injected This list may not describe all possible side effects. Call your doctor for medical advice about side effects. You may report side  effects to FDA at 1-800-FDA-1088. Where should I keep my medication? This vaccine is only given by a health care provider. It will not be stored at home. NOTE: This sheet is a summary. It may not cover all  possible information. If you have questions about this medicine, talk to your doctor, pharmacist, or health care provider.  2023 Elsevier/Gold Standard (2019-09-09 00:00:00)

## 2021-10-02 NOTE — Progress Notes (Addendum)
Chief Complaint  Patient presents with   Follow-up    6 month f/u   F/u  1. Waking up in am with h/a h/o migraines having to take nurtec 2x per week, osa not able to use cpap, stopped emgality does not like given herself shots and wants to get back on amitriptyline 25 mg qhs at 1st will try magnesium to see if help h/a and increase water intake  2. Right forearm laceration due to dog scratching her  3. Anxiety worse with riding in the car   Review of Systems  Constitutional:  Negative for weight loss.  HENT:  Negative for hearing loss.   Eyes:  Negative for blurred vision.  Respiratory:  Negative for shortness of breath.   Cardiovascular:  Negative for chest pain.  Gastrointestinal:  Negative for abdominal pain and blood in stool.  Genitourinary:  Negative for dysuria.  Musculoskeletal:  Negative for falls and joint pain.  Skin:  Negative for rash.  Neurological:  Negative for headaches.  Psychiatric/Behavioral:  Negative for depression.    Past Medical History:  Diagnosis Date   Allergy    Arthritis    Basal cell carcinoma    nose, left cheek, right upper lip, right posterior shoulder most recent 6 years ago on left cheek   Basal cell carcinoma 03/26/2021   L chest, [facility] 04/24/2021   Bleeding    Chicken pox    Complication of anesthesia    blood pressure elevated after gastric sleeve surgery in recovery, pt does not want mask over face; migraines   COVID-19    03/01/20 had MAB 03/03/20 and felt better next day 03/04/20   Depression    Diabetes mellitus without complication ([place])    pre-diabetic currently, was diabetic prior to gastric sleeve surgery    Disorder of right rotator cuff    10/03/19 MRI right shoulder abnormal Dr. Maurine Cane   Fall    08/2020   Fall    03/28/21   Fatty liver    Gout    Hematuria 10/15/2018   History of shingles 06/19/2021   lower back and R side of thigh   Hyperlipidemia    Hypertension    no problems since losing weight after gastric sleeve  surgery   IBS (irritable bowel syndrome)    Insomnia    Migraine    Neuropathy    bilateral feet   Obesity    Osteopenia    PONV (postoperative nausea and vomiting)    Squamous cell carcinoma of skin 03/26/2021   SCC IS R mid sternum, [facility] 04/24/2021   Past Surgical History:  Procedure Laterality Date   ABDOMINAL HYSTERECTOMY     no h/o abnormal pap still has 1 ovary left   ANKLE ARTHROSCOPY Left 05/29/2016   Procedure: ANKLE ARTHROSCOPY;  Surgeon: [name], DPM;  Location: [place];  Service: Podiatry;  Laterality: Left;   APPENDECTOMY     basal cell removed     [place] PROPOFOL N/A 01/31/2019   Procedure: COLONOSCOPY WITH PROPOFOL;  Surgeon: Lin Landsman, MD;  Location: [facility] ENDOSCOPY;  Service: Gastroenterology;  Laterality: N/A;   cyst removed from wrist     ESOPHAGOGASTRODUODENOSCOPY (EGD) WITH PROPOFOL N/A 01/31/2019   Procedure: ESOPHAGOGASTRODUODENOSCOPY (EGD) WITH PROPOFOL;  Surgeon: Lin Landsman, MD;  Location: [place];  Service: Gastroenterology;  Laterality: N/A;   FLEXIBLE SIGMOIDOSCOPY N/A 05/21/2020  Procedure: FLEXIBLE SIGMOIDOSCOPY;  Surgeon: Lin Landsman, MD;  Location: [facility] ENDOSCOPY;  Service: Gastroenterology;  Laterality: N/A;   FOOT SURGERY     left foot 11/2016 Dr. Laymond Purser    left elbow surgery     07/2017 Dr Leanor Kail epicondylitis    LYSIS OF ADHESION Right 08/27/2021   Procedure: Right shoulder arthroscopic lysis of adhesions, capsular release, and manipulation under anesthesia with corticosteroid injection and biceps tenodesis;  Surgeon: Leim Fabry, MD;  Location: [place];  Service: Orthopedics;  Laterality: Right;   MOHS SURGERY     x 2 face nose and left cheek    OSTEOCHONDROMA EXCISION Left 05/29/2016   Procedure: OSTEOCHONDRAL DRILLING TALUS;  Surgeon: [name], DPM;  Location: [place];  Service: Podiatry;  Laterality: Left;    PLANTAR FASCIA RELEASE Left 05/29/2016   Procedure: ENDOSCOPIC PLANTAR FASCIOTOMY;  Surgeon: [name], DPM;  Location: [place];  Service: Podiatry;  Laterality: Left;   ROTATOR CUFF REPAIR Left    STOMACH SURGERY  2017   gastric sleeve surgery   TENNIS ELBOW RELEASE/NIRSCHEL PROCEDURE Left 08/05/2017   Procedure: TENNIS ELBOW RELEASE/NIRSCHEL PROCEDURE;  Surgeon: Leanor Kail, MD;  Location: [facility];  Service: Orthopedics;  Laterality: Left;   TUBAL LIGATION     wedge resection of ovary  1982   Family History  Problem Relation Age of Onset   Heart disease Mother    Breast cancer Mother 70       metastatic, now hip, spine, and leg    Arthritis Mother    Hyperlipidemia Mother    Cancer Mother        spinal surgery. breast dx'ed age 77 now metastatic 34 as of 07/13/18   Cystic fibrosis Mother        carrier    Heart disease Father    Lung cancer Father        smoker   Hyperlipidemia Father    Hypertension Father    Diabetes Sister    Stroke Sister    Other Sister        covid +   Diabetes Sister    Arthritis Maternal Grandmother    Diabetes Maternal Grandfather    Hyperlipidemia Maternal Grandfather    Diabetes Maternal Aunt    Diabetes Maternal Aunt    Diabetes Maternal Uncle    Sudden death Cousin    Cystic fibrosis Other        great niece    Factor V Leiden deficiency Other        uncle and cousins and m GF (all maternal)   Colon cancer Neg Hx    Ovarian cancer Neg Hx    Social History   Socioeconomic History   Marital status: Married    Spouse name: Not on file   Number of children: 3   Years of education: Not on file   Highest education level: Some college, no degree  Occupational History   Not on file  Tobacco Use   Smoking status: Never   Smokeless tobacco: Never  Vaping Use   Vaping Use: Never used  Substance and Sexual Activity   Alcohol use: Never   Drug use: Never   Sexual activity: Yes    Partners: Male    Birth control/protection:  Surgical  Other Topics Concern   Not on file  Social History Narrative   Married x 37 years as of 10/2017    Kids 3 boys youngest is [place]  Write childrens books    Retired from [employer] 01/2017    Enjoys traveling    Right handed   Caffeine: quit 2009   Social Determinants of Health   Financial Resource Strain: Low Risk  (02/06/2021)   Overall Financial Resource Strain (CARDIA)    Difficulty of Paying Living Expenses: Not hard at all  Food Insecurity: Not on file  Transportation Needs: Not on file  Physical Activity: Inactive (02/10/2017)   Exercise Vital Sign    Days of Exercise per Week: 0 days    Minutes of Exercise per Session: 0 min  Stress: Not on file  Social Connections: Not on file  Intimate Partner Violence: Not on file   Current Meds  Medication Sig   acetaminophen (TYLENOL) 500 MG tablet Take 2 tablets (1,000 mg total) by mouth every 8 (eight) hours.   amitriptyline (ELAVIL) 25 MG tablet Take 1 tablet (25 mg total) by mouth at bedtime.   buPROPion (WELLBUTRIN XL) 150 MG 24 hr tablet Take 150 mg by mouth daily.   Cholecalciferol (DIALYVITE VITAMIN D 5000 PO) Take 5,000 Units by mouth every other day.   dicyclomine (BENTYL) 10 MG capsule Take 10 mg by mouth 4 (four) times daily -  before meals and at bedtime.   fexofenadine (ALLEGRA) 180 MG tablet TAKE 1 TABLET (180 MG TOTAL) BY MOUTH DAILY AS NEEDED FOR ALLERGIES OR RHINITIS.   gabapentin (NEURONTIN) 300 MG capsule Take 1 capsule (300 mg total) by mouth 3 (three) times daily as needed.   glucose blood (FREESTYLE LITE) test strip Use as instructed daily. E11.59, E16.2. dispense one touch verio reflect strips   hydrOXYzine (ATARAX) 25 MG tablet Take 1 tablet (25 mg total) by mouth 2 (two) times daily as needed.   metroNIDAZOLE (METROCREAM) 0.75 % cream Apply to face once a day for rosacea, twice daily with flares.   montelukast (SINGULAIR) 10 MG tablet Take 1 tablet (10 mg total) by mouth at bedtime.   MOUNJARO 5  MG/0.5ML Pen INJECT 5 MG SUBCUTANEOUSLY WEEKLY   Multiple Vitamin (MULTIVITAMIN) tablet Take 1 tablet by mouth daily.   mupirocin ointment (BACTROBAN) 2 % Apply 1 Application topically 2 (two) times daily. Right forearm   omeprazole (PRILOSEC) 40 MG capsule TAKE 1 CAPSULE (40 MG TOTAL) BY MOUTH DAILY BEFORE SUPPER.   ondansetron (ZOFRAN-ODT) 4 MG disintegrating tablet Take 1 tablet (4 mg total) by mouth every 8 (eight) hours as needed. for nausea   ondansetron (ZOFRAN-ODT) 4 MG disintegrating tablet Take 1 tablet (4 mg total) by mouth every 8 (eight) hours as needed for nausea or vomiting.   Rimegepant Sulfate (NURTEC) 75 MG TBDP Take 75 mg by mouth daily as needed (take for abortive therapy of migraine, no more than 1 tablet in 24 hours or 10 per month).   rosuvastatin (CRESTOR) 20 MG tablet Take 1 tablet (20 mg total) by mouth daily.   topiramate (TOPAMAX) 50 MG tablet Take 1 tablet (50 mg total) by mouth 2 (two) times daily.   triamcinolone cream (KENALOG) 0.1 % Apply 1 Application topically 2 (two) times daily as needed.   valACYclovir (VALTREX) 1000 MG tablet Take 1 tablet (1,000 mg total) by mouth 3 (three) times daily. X 10-14 With food   zolpidem (AMBIEN CR) 12.5 MG CR tablet Take 1 tablet (12.5 mg total) by mouth at bedtime as needed.   Allergies  Allergen Reactions   Tetracyclines & Related Swelling and Rash    FACE SWELL, RASH ON CHEST  Amoxicillin    Alcohol Rash    ABDOMINAL CRAMPS, drinking alcohol   Azithromycin Rash   Metoprolol Diarrhea   Recent Results (from the past 2160 hour(s))  Glucose, capillary     Status: None   Collection Time: 08/27/21 12:03 PM  Result Value Ref Range   Glucose-Capillary 75 70 - 99 mg/dL    Comment: Glucose reference range applies only to samples taken after fasting for at least 8 hours.  Glucose, capillary     Status: None   Collection Time: 08/27/21  2:57 PM  Result Value Ref Range   Glucose-Capillary 86 70 - 99 mg/dL    Comment: Glucose  reference range applies only to samples taken after fasting for at least 8 hours.   Objective  Body mass index is 21.25 kg/m. Wt Readings from Last 3 Encounters:  10/02/21 116 lb 3.2 oz (52.7 kg)  08/27/21 118 lb (53.5 kg)  06/19/21 125 lb (56.7 kg)   Temp Readings from Last 3 Encounters:  10/02/21 97.6 F (36.4 C) (Oral)  08/27/21 (!) 97.5 F (36.4 C)  07/11/21 98.2 F (36.8 C)   BP Readings from Last 3 Encounters:  10/02/21 110/60  08/27/21 (!) 121/54  07/11/21 101/68   Pulse Readings from Last 3 Encounters:  10/02/21 70  08/27/21 68  07/11/21 75    Physical Exam Vitals and nursing note reviewed.  Constitutional:      Appearance: Normal appearance. She is well-developed and well-groomed.  HENT:     Head: Normocephalic and atraumatic.  Eyes:     Conjunctiva/sclera: Conjunctivae normal.     Pupils: Pupils are equal, round, and reactive to light.  Cardiovascular:     Rate and Rhythm: Normal rate and regular rhythm.     Heart sounds: Normal heart sounds. No murmur heard. Pulmonary:     Effort: Pulmonary effort is normal.     Breath sounds: Normal breath sounds.  Abdominal:     General: Abdomen is flat. Bowel sounds are normal.     Tenderness: There is no abdominal tenderness.  Musculoskeletal:        General: No tenderness.  Skin:    General: Skin is warm and dry.  Neurological:     General: No focal deficit present.     Mental Status: She is alert and oriented to person, place, and time. Mental status is at baseline.     Cranial Nerves: Cranial nerves 2-12 are intact.     Motor: Motor function is intact.     Coordination: Coordination is intact.     Gait: Gait is intact.  Psychiatric:        Attention and Perception: Attention and perception normal.        Mood and Affect: Mood and affect normal.        Speech: Speech normal.        Behavior: Behavior normal. Behavior is cooperative.        Thought Content: Thought content normal.        Cognition and  Memory: Cognition and memory normal.        Judgment: Judgment normal.     Assessment  Plan  Intractable migraine without aura and with status migrainosus - Plan: amitriptyline (ELAVIL) 25 MG tablet Am h/a est GNA and f/u prn   Calm magnesium powder and pill 250 mg daily   Anxiety -call [facility] to help with anxiety  having to take nurtec 2x per week, osa not able to use cpap, stopped emgality does  not like given herself shots and wants to get back on amitriptyline 25 mg qhs at 1st will try magnesium to see if help h/a and increase water intake   Open wound right forearm laceration dog nails from jumping on her - Plan: mupirocin ointment (BACTROBAN) 2 %   Hair loss could be genetic did not disc with derm but could in the future  Nutrafol for hair loss serum, collagen supplements Consider   HM Flu shot  given today Prevnar utd  utd Tdap 01/08/13 1/2 shingrix  Consider prevnar 20  covid vx had 4/4 moderna consider 4th dose  hep A vaccine 2/2 immune h/o fatty liver hep B immune with h/o fatty liver  Hep C neg  MMR immune   Never smoker, chew congratulated not doing either    mammo 07/05/19 b/l diag normal right axillary mass fat pad likely MSK pain/ttp -screening mammo in 1 year ordered call and sch 07/31/20 negative can and schedule 10/24/21     Colonoscopy -obtained records 07/15/11 colonoscopy Dr. Candace Cruise nl repeat in 10 years  Colonoscopy 01/31/19 Dr. Marius Ditch IH prep fair hemmorhiods, tubular adenoma f/u in 5 years likely with GI   Pap s/p hysterectomy h.o fibroids ? Cervix intact has appt Dr. Enzo Bi 02/10/17.  -Appt went well 01/2017 ob/gyn f/u in 1 year -per note pap not needed she will f/u with Dr. Marcelline Mates now last seen 03/10/2018  pap 08/19/19 neg neg HPV   DEXA consider age 30    Dermatology sees [place] [facility] q6 months h/o BCC nose and left cheek s/p Mohs and h/o Aks   -seen 09/2021 Dr. Brendolyn Patty  06/04/2020 [facility] neg retinopathy    Healthy diet  and exercise rec Provider: Dr. Olivia Mackie McLean-Scocuzza-Internal Medicine

## 2021-10-08 ENCOUNTER — Ambulatory Visit (INDEPENDENT_AMBULATORY_CARE_PROVIDER_SITE_OTHER): Payer: BC Managed Care – PPO | Admitting: Gastroenterology

## 2021-10-08 ENCOUNTER — Encounter: Payer: Self-pay | Admitting: Gastroenterology

## 2021-10-08 VITALS — BP 114/77 | HR 73 | Ht 62.0 in | Wt 117.2 lb

## 2021-10-08 DIAGNOSIS — K640 First degree hemorrhoids: Secondary | ICD-10-CM | POA: Diagnosis not present

## 2021-10-08 DIAGNOSIS — K581 Irritable bowel syndrome with constipation: Secondary | ICD-10-CM

## 2021-10-08 DIAGNOSIS — K760 Fatty (change of) liver, not elsewhere classified: Secondary | ICD-10-CM | POA: Diagnosis not present

## 2021-10-09 DIAGNOSIS — M75121 Complete rotator cuff tear or rupture of right shoulder, not specified as traumatic: Secondary | ICD-10-CM | POA: Diagnosis not present

## 2021-10-09 NOTE — Progress Notes (Signed)

## 2021-10-09 NOTE — Progress Notes (Signed)
Cephas Darby, MD [address]  [place]  [place], [place] [address]  Main: [phone]  Fax: [phone]    Gastroenterology Consultation  Referring Provider:     McLean-Scocuzza, Olivia Mackie * Primary Care Physician:  McLean-Scocuzza, Nino Glow, MD Primary Gastroenterologist:  Dr. Cephas Darby Reason for Consultation: Symptomatic hemorrhoids       HPI:   Kristin [name] is a 61 y.o. female referred by Dr. Terese Door, Nino Glow, MD  for consultation & management of chronic diarrhea.  Patient reports that she started experiencing nonbloody watery bowel movements since beginning of October, primarily postprandial abdominal cramps in the left mid quadrant followed by a BM.  This has restricted her travel to [place] to take care of her mother.  She underwent work-up including stool studies to rule out infection and came back negative.  She stopped Celebrex thinking that it may have caused diarrhea but unchanged.  She denies any new medications 3 months.  She does have mild diabetes.  TSH is normal.  No evidence of anemia, normal CMP.  Also, limited intake of food due to postprandial urgency that has resulted in few pounds of weight loss.  She is currently taking Pepto-Bismol and Imodium as needed which is controlling her diarrhea. She also reports chronic intermittent heartburn for which she takes omeprazole 20 mg at bedtime. She denies nocturnal heartburn She does have chronic migraine and currently on multiple medications, followed by neurology  She used to work at [employer].  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  She had started experiencing bright red blood per rectum with rectal pressure/swelling on 3/30 which lasted for several days, it was bright red blood per rectum associated with clots.  She reports that her bowel movements remain alternating between  constipation and diarrhea within a week itself.  For last 2 days she noticed recurrence of rectal bleeding again.  Patient reports that she made significant changes in her dietary habits, lost more than 10 pounds since last visit.  She does report lower abdominal cramps during bleeding episodes.  Her hemoglobin was normal on 4/14.  She had an upper endoscopy and colonoscopy in 1/21 which were unremarkable  Follow-up visit 06/05/2020 Patient is here for follow-up of chronic diarrhea and rectal bleeding.  Her rectal bleeding has currently resolved.  Patient experienced rectal discomfort post banding for about 30 hours and she is hesitant to undergo repeat banding today.  Also, her bleeding has not recurred since her last banding.  She has ongoing diarrhea and 1 or 2 days of constipation but diarrhea is her main concern associated with abdominal bloating and cramps.  She is currently taking Bentyl 20 mg as needed which helps to prevent further episodes after she had an episode of diarrhea.  She continues to take amitriptyline 25 mg at bedtime.  Flexible sigmoidoscopy with repeat biopsies was unremarkable except for hemorrhoids.  She has lost few pounds due to restricted diet and not knowing what foods are triggering her diarrheal episodes.  She is planning on a cruise to [place] in September for her 40th anniversary.  She will be traveling to [place] to visit her mom who is fighting with cancer.  That's the only stress in her life at this time.  Follow-up visit 09/10/2020 Patient is here for follow-up of chronic diarrhea alternating with constipation as well as fatty liver.  She  reports that last weekend, she had an episode of constipation that triggered a flareup of hemorrhoids.  Otherwise, most of the days she has postprandial diarrhea, nonbloody, usually 1 episode.  Her pancreatic fecal elastase levels were normal.  Stool studies were negative for infection.  I have empirically tried antibiotic for bacterial  overgrowth which did not help.  She continues to take amitriptyline 25 mg at bedtime as well as Bentyl as needed.  Her weight has been stable  Follow-up visit 10/09/2021 Patient is here for follow-up of symptomatic hemorrhoids.  She underwent ligation of right posterior hemorrhoid in 2022.  Patient has lost about 50 pounds since last visit with me.  She is started on [facility] for her diabetes, she is trying to follow healthy diet.  She feels significantly better overall.  She reports ongoing episodes of alternating doses of severe constipation followed by diarrhea.  Patient has not been taking MiraLAX on a regular basis.  She is trying to cut back on sugars.  She has a hard time drinking adequate amount of water.  She is incorporating more fruits and vegetables in her diet.  She reports that her hemorrhoids have been bothering her lately with rectal pressure, discomfort, swelling.  She denies any rectal bleeding  NSAIDs: None  Antiplts/Anticoagulants/Anti thrombotics: None  GI Procedures: Had a colonoscopy at age 15, reportedly normal  Sigmoidoscopy 05/21/2020 - Normal mucosa in the entire examined colon. - A single (solitary) ulcer in the distal rectum. - Non-bleeding external hemorrhoids. DIAGNOSIS:  A. COLON, RANDOM; COLD BIOPSY:  - BENIGN COLONIC MUCOSA WITH NO SIGNIFICANT HISTOPATHOLOGIC CHANGE.  - NEGATIVE FOR FEATURES OF MICROSCOPIC COLITIS.  - NEGATIVE FOR DYSPLASIA AND MALIGNANCY.   EGD and colonoscopy 01/31/2019 - Normal duodenal bulb and second portion of the duodenum. Biopsied. - Normal stomach. Biopsied. - Esophagogastric landmarks identified. - Normal gastroesophageal junction and esophagus.  - Preparation of the colon was fair. - Hemorrhoids found on perianal exam. - The examined portion of the ileum was normal. - The entire examined colon is normal. Biopsied. - The distal rectum and anal verge are normal on retroflexion view.  DIAGNOSIS:  A. DUODENUM; COLD BIOPSY:  -  DUODENAL MUCOSA WITH NO SIGNIFICANT PATHOLOGIC ALTERATION.  - NEGATIVE FOR FEATURES OF CELIAC DISEASE.  - NEGATIVE FOR DYSPLASIA AND MALIGNANCY.   B. STOMACH; COLD BIOPSY:  - GASTRIC ANTRAL AND OXYNTIC MUCOSA WITH NO SIGNIFICANT PATHOLOGIC  ALTERATION.  - NEGATIVE FOR ACTIVE INFLAMMATION AND H PYLORI.  - NEGATIVE FOR INTESTINAL METAPLASIA, DYSPLASIA, AND MALIGNANCY.   C. COLON, RANDOM; COLD BIOPSY:  - COLONIC MUCOSA WITH NO SIGNIFICANT PATHOLOGIC ALTERATION.  - NEGATIVE FOR MICROSCOPIC COLITIS, DYSPLASIA, AND MALIGNANCY.   D. COLON POLYP, SIGMOID; COLD SNARE:  - TUBULAR ADENOMA.  - NEGATIVE FOR HIGH-GRADE DYSPLASIA AND MALIGNANCY.  She denies family history of inflammatory bowel disease, celiac disease, GI malignancy  Past Medical History:  Diagnosis Date   Allergy    Arthritis    Basal cell carcinoma    nose, left cheek, right upper lip, right posterior shoulder most recent 6 years ago on left cheek   Basal cell carcinoma 03/26/2021   L chest, [facility] 04/24/2021   Bleeding    Chicken pox    Complication of anesthesia    blood pressure elevated after gastric sleeve surgery in recovery, pt does not want mask over face; migraines   COVID-19    03/01/20 had MAB 03/03/20 and felt better next day 03/04/20   Depression    Diabetes mellitus  without complication ([place])    pre-diabetic currently, was diabetic prior to gastric sleeve surgery    Disorder of right rotator cuff    10/03/19 MRI right shoulder abnormal Dr. Maurine Cane   Fall    08/2020   Fall    03/28/21   Fatty liver    Gout    Hematuria 10/15/2018   History of shingles 06/19/2021   lower back and R side of thigh   Hyperlipidemia    Hypertension    no problems since losing weight after gastric sleeve surgery   IBS (irritable bowel syndrome)    Insomnia    Migraine    Neuropathy    bilateral feet   Obesity    Osteopenia    PONV (postoperative nausea and vomiting)    Squamous cell carcinoma of skin 03/26/2021   SCC IS R  mid sternum, [facility] 04/24/2021    Past Surgical History:  Procedure Laterality Date   ABDOMINAL HYSTERECTOMY     no h/o abnormal pap still has 1 ovary left   ANKLE ARTHROSCOPY Left 05/29/2016   Procedure: ANKLE ARTHROSCOPY;  Surgeon: [name], DPM;  Location: [place];  Service: Podiatry;  Laterality: Left;   APPENDECTOMY     basal cell removed     [place] PROPOFOL N/A 01/31/2019   Procedure: COLONOSCOPY WITH PROPOFOL;  Surgeon: Lin Landsman, MD;  Location: [facility] ENDOSCOPY;  Service: Gastroenterology;  Laterality: N/A;   cyst removed from wrist     ESOPHAGOGASTRODUODENOSCOPY (EGD) WITH PROPOFOL N/A 01/31/2019   Procedure: ESOPHAGOGASTRODUODENOSCOPY (EGD) WITH PROPOFOL;  Surgeon: Lin Landsman, MD;  Location: [place];  Service: Gastroenterology;  Laterality: N/A;   FLEXIBLE SIGMOIDOSCOPY N/A 05/21/2020   Procedure: FLEXIBLE SIGMOIDOSCOPY;  Surgeon: Lin Landsman, MD;  Location: [facility] ENDOSCOPY;  Service: Gastroenterology;  Laterality: N/A;   FOOT SURGERY     left foot 11/2016 Dr. Laymond Purser    left elbow surgery     07/2017 Dr Leanor Kail epicondylitis    LYSIS OF ADHESION Right 08/27/2021   Procedure: Right shoulder arthroscopic lysis of adhesions, capsular release, and manipulation under anesthesia with corticosteroid injection and biceps tenodesis;  Surgeon: Leim Fabry, MD;  Location: [place];  Service: Orthopedics;  Laterality: Right;   MOHS SURGERY     x 2 face nose and left cheek    OSTEOCHONDROMA EXCISION Left 05/29/2016   Procedure: OSTEOCHONDRAL DRILLING TALUS;  Surgeon: [name], DPM;  Location: [place];  Service: Podiatry;  Laterality: Left;   PLANTAR FASCIA RELEASE Left 05/29/2016   Procedure: ENDOSCOPIC PLANTAR FASCIOTOMY;  Surgeon: [name], DPM;  Location: [place];  Service: Podiatry;  Laterality: Left;   ROTATOR CUFF REPAIR Left    STOMACH SURGERY  2017    gastric sleeve surgery   TENNIS ELBOW RELEASE/NIRSCHEL PROCEDURE Left 08/05/2017   Procedure: TENNIS ELBOW RELEASE/NIRSCHEL PROCEDURE;  Surgeon: Leanor Kail, MD;  Location: [facility];  Service: Orthopedics;  Laterality: Left;   TUBAL LIGATION     wedge resection of ovary  1982    Current Outpatient Medications:    acetaminophen (TYLENOL) 500 MG tablet, Take 2 tablets (1,000 mg total) by mouth every 8 (eight) hours., Disp: 90 tablet, Rfl: 2   amitriptyline (ELAVIL) 25 MG tablet, Take 1 tablet (25 mg total) by mouth at bedtime., Disp: 90 tablet, Rfl: 3   buPROPion (WELLBUTRIN XL) 150 MG 24  hr tablet, Take 150 mg by mouth daily., Disp: , Rfl:    Cholecalciferol (DIALYVITE VITAMIN D 5000 PO), Take 5,000 Units by mouth every other day., Disp: , Rfl:    dicyclomine (BENTYL) 10 MG capsule, Take 10 mg by mouth 4 (four) times daily -  before meals and at bedtime., Disp: , Rfl:    fexofenadine (ALLEGRA) 180 MG tablet, TAKE 1 TABLET (180 MG TOTAL) BY MOUTH DAILY AS NEEDED FOR ALLERGIES OR RHINITIS., Disp: 90 tablet, Rfl: 3   gabapentin (NEURONTIN) 300 MG capsule, Take 1 capsule (300 mg total) by mouth 3 (three) times daily as needed., Disp: 90 capsule, Rfl: 11   glucose blood (FREESTYLE LITE) test strip, Use as instructed daily. E11.59, E16.2. dispense one touch verio reflect strips, Disp: 100 each, Rfl: 12   hydrOXYzine (ATARAX) 25 MG tablet, Take 1 tablet (25 mg total) by mouth 2 (two) times daily as needed., Disp: 180 tablet, Rfl: 3   montelukast (SINGULAIR) 10 MG tablet, Take 1 tablet (10 mg total) by mouth at bedtime., Disp: 90 tablet, Rfl: 3   MOUNJARO 5 MG/0.5ML Pen, INJECT 5 MG SUBCUTANEOUSLY WEEKLY, Disp: 2 mL, Rfl: 1   Multiple Vitamin (MULTIVITAMIN) tablet, Take 1 tablet by mouth daily., Disp: , Rfl:    mupirocin ointment (BACTROBAN) 2 %, Apply 1 Application topically 2 (two) times daily. Right forearm, Disp: 30 g, Rfl: 0   omeprazole (PRILOSEC) 40 MG capsule, TAKE 1 CAPSULE (40 MG TOTAL) BY  MOUTH DAILY BEFORE SUPPER., Disp: 30 capsule, Rfl: 0   ondansetron (ZOFRAN-ODT) 4 MG disintegrating tablet, Take 1 tablet (4 mg total) by mouth every 8 (eight) hours as needed. for nausea, Disp: 30 tablet, Rfl: 5   ondansetron (ZOFRAN-ODT) 4 MG disintegrating tablet, Take 1 tablet (4 mg total) by mouth every 8 (eight) hours as needed for nausea or vomiting., Disp: 20 tablet, Rfl: 0   Rimegepant Sulfate (NURTEC) 75 MG TBDP, Take 75 mg by mouth daily as needed (take for abortive therapy of migraine, no more than 1 tablet in 24 hours or 10 per month)., Disp: 10 tablet, Rfl: 5   rosuvastatin (CRESTOR) 20 MG tablet, Take 1 tablet (20 mg total) by mouth daily., Disp: 90 tablet, Rfl: 3   topiramate (TOPAMAX) 50 MG tablet, Take 1 tablet (50 mg total) by mouth 2 (two) times daily., Disp: 90 tablet, Rfl: 3   triamcinolone cream (KENALOG) 0.1 %, Apply 1 Application topically 2 (two) times daily as needed., Disp: 45 g, Rfl: 0   valACYclovir (VALTREX) 1000 MG tablet, Take 1 tablet (1,000 mg total) by mouth 3 (three) times daily. X 10-14 With food, Disp: 42 tablet, Rfl: 0   zolpidem (AMBIEN CR) 12.5 MG CR tablet, Take 1 tablet (12.5 mg total) by mouth at bedtime as needed., Disp: 30 tablet, Rfl: 2   Family History  Problem Relation Age of Onset   Heart disease Mother    Breast cancer Mother 44       metastatic, now hip, spine, and leg    Arthritis Mother    Hyperlipidemia Mother    Cancer Mother        spinal surgery. breast dx'ed age 65 now metastatic 19 as of 07/13/18   Cystic fibrosis Mother        carrier    Heart disease Father    Lung cancer Father        smoker   Hyperlipidemia Father    Hypertension Father    Diabetes Sister  Stroke Sister    Other Sister        covid +   Diabetes Sister    Arthritis Maternal Grandmother    Diabetes Maternal Grandfather    Hyperlipidemia Maternal Grandfather    Diabetes Maternal Aunt    Diabetes Maternal Aunt    Diabetes Maternal Uncle    Sudden  death Cousin    Cystic fibrosis Other        great niece    Factor V Leiden deficiency Other        uncle and cousins and m GF (all maternal)   Colon cancer Neg Hx    Ovarian cancer Neg Hx      Social History   Tobacco Use   Smoking status: Never   Smokeless tobacco: Never  Vaping Use   Vaping Use: Never used  Substance Use Topics   Alcohol use: Never   Drug use: Never    Allergies as of 10/08/2021 - Review Complete 10/08/2021  Allergen Reaction Noted   Tetracyclines & related Swelling and Rash 06/04/2014   Amoxicillin  12/10/2018   Alcohol Rash 12/07/2014   Azithromycin Rash 07/19/2018   Metoprolol Diarrhea 12/07/2014    Review of Systems:    All systems reviewed and negative except where noted in HPI.   Physical Exam:  BP 114/77 (BP Location: Right Arm, Patient Position: Sitting, Cuff Size: Normal)   Pulse 73   Ht '5\' 2"'$  (1.575 m)   Wt 117 lb 3.2 oz (53.2 kg)   BMI 21.44 kg/m  No LMP recorded. Patient has had a hysterectomy.  General:   Alert,  Well-developed, well-nourished, pleasant and cooperative in NAD Head:  Normocephalic and atraumatic. Eyes:  Sclera clear, no icterus.   Conjunctiva pink. Ears:  Normal auditory acuity. Nose:  No deformity, discharge, or lesions. Mouth:  No deformity or lesions,oropharynx pink & moist. Neck:  Supple; no masses or thyromegaly. Lungs:  Respirations even and unlabored.  Clear throughout to auscultation.   No wheezes, crackles, or rhonchi. No acute distress. Heart:  Regular rate and rhythm; no murmurs, clicks, rubs, or gallops. Abdomen:  Normal bowel sounds. Soft, non-tender and non-distended without masses, hepatosplenomegaly or hernias noted.  No guarding or rebound tenderness.   Rectal: Not performed Msk:  Symmetrical without gross deformities. Good, equal movement & strength bilaterally. Pulses:  Normal pulses noted. Extremities:  No clubbing or edema.  No cyanosis. Neurologic:  Alert and oriented x3;  grossly normal  neurologically. Skin:  Intact without significant lesions or rashes. No jaundice. Psych:  Alert and cooperative. Normal mood and affect.  Imaging Studies: Reviewed  Assessment and Plan:   Stamatia Masri Scheper is a 62 y.o. white female with history of metabolic syndrome, lost about 50 pounds on Mounjaro being treated for diabetes seen in consultation for symptomatic hemorrhoids, IBS constipation and chronic GERD  IBS constipation Reiterated on high-fiber diet, start Metamucil with large cup of water daily Start MiraLAX half a capful daily  Symptomatic grade 1 external hemorrhoids with recent flareup S/p ligation of RP hemorrhoid in 2022 Patient is interested to undergo hemorrhoid ligation today, consent obtained Perform hemorrhoid ligation today  Chronic GERD EGD with no evidence of Barrett's esophagus or erosive esophagitis Continue omeprazole 40 mg once daily Continue antireflux lifestyle  Fatty liver, previously elevated LFTs, s/p cholecystectomy: Currently resolved secondary liver disease work-up is unremarkable Monitor LFTs every 6 months Congratulated on diet control control of diabetes, weight loss and exercise   Follow up in 2 to  3 weeks for repeat hemorrhoid ligation   Cephas Darby, MD

## 2021-10-10 ENCOUNTER — Other Ambulatory Visit: Payer: Self-pay | Admitting: Orthopedic Surgery

## 2021-10-13 ENCOUNTER — Other Ambulatory Visit: Payer: Self-pay | Admitting: Internal Medicine

## 2021-10-13 DIAGNOSIS — E785 Hyperlipidemia, unspecified: Secondary | ICD-10-CM

## 2021-10-17 ENCOUNTER — Encounter: Payer: Self-pay | Admitting: Orthopedic Surgery

## 2021-10-17 ENCOUNTER — Other Ambulatory Visit: Payer: Self-pay

## 2021-10-18 NOTE — Progress Notes (Signed)
Patient needs to hold [facility] for 1 week prior to her surgery

## 2021-10-21 ENCOUNTER — Telehealth: Payer: Self-pay

## 2021-10-21 ENCOUNTER — Other Ambulatory Visit: Payer: Self-pay

## 2021-10-21 ENCOUNTER — Other Ambulatory Visit: Payer: Self-pay | Admitting: Internal Medicine

## 2021-10-21 DIAGNOSIS — G4700 Insomnia, unspecified: Secondary | ICD-10-CM

## 2021-10-21 MED ORDER — ZOLPIDEM TARTRATE ER 12.5 MG PO TBCR: 12.5000 mg | EXTENDED_RELEASE_TABLET | Freq: Every evening | ORAL | 5 refills | Status: DC | PRN

## 2021-10-21 NOTE — Telephone Encounter (Signed)
Patient states she needs a refill for her zolpidem (AMBIEN CR) 12.5 MG CR tablet.  Patient states she would like for [place] to send the refill to a different pharmacy - [facility] [address] in [place].  **Patient states she would like for [place] to switch [facility] [place] [facility] [address] [facility] [place].

## 2021-10-22 ENCOUNTER — Encounter: Payer: Self-pay | Admitting: Gastroenterology

## 2021-10-22 ENCOUNTER — Ambulatory Visit (INDEPENDENT_AMBULATORY_CARE_PROVIDER_SITE_OTHER): Payer: BC Managed Care – PPO | Admitting: Gastroenterology

## 2021-10-22 VITALS — BP 111/75 | HR 69 | Temp 97.9°F | Ht 62.0 in | Wt 115.5 lb

## 2021-10-22 DIAGNOSIS — K640 First degree hemorrhoids: Secondary | ICD-10-CM

## 2021-10-22 NOTE — Progress Notes (Signed)

## 2021-10-24 ENCOUNTER — Ambulatory Visit
Admission: RE | Admit: 2021-10-24 | Discharge: 2021-10-24 | Disposition: A | Discharge status: Home / Self Care | Payer: BC Managed Care – PPO | Source: Ambulatory Visit | Attending: Internal Medicine | Admitting: Internal Medicine

## 2021-10-24 DIAGNOSIS — Z1231 Encounter for screening mammogram for malignant neoplasm of breast: Secondary | ICD-10-CM | POA: Diagnosis not present

## 2021-10-25 ENCOUNTER — Ambulatory Visit: Payer: BC Managed Care – PPO | Admitting: Anesthesiology

## 2021-10-25 ENCOUNTER — Other Ambulatory Visit: Payer: Self-pay | Admitting: Gastroenterology

## 2021-10-25 ENCOUNTER — Encounter
Admission: RE | Disposition: A | Discharge status: Home / Self Care | Payer: Self-pay | Source: Home / Self Care | Attending: Orthopedic Surgery

## 2021-10-25 ENCOUNTER — Other Ambulatory Visit: Payer: Self-pay

## 2021-10-25 ENCOUNTER — Ambulatory Visit
Admission: RE | Admit: 2021-10-25 | Discharge: 2021-10-25 | Disposition: A | Discharge status: Home / Self Care | Payer: BC Managed Care – PPO | Attending: Orthopedic Surgery | Admitting: Orthopedic Surgery

## 2021-10-25 ENCOUNTER — Encounter: Payer: Self-pay | Admitting: Orthopedic Surgery

## 2021-10-25 DIAGNOSIS — M19011 Primary osteoarthritis, right shoulder: Secondary | ICD-10-CM | POA: Diagnosis not present

## 2021-10-25 DIAGNOSIS — G4730 Sleep apnea, unspecified: Secondary | ICD-10-CM | POA: Diagnosis not present

## 2021-10-25 DIAGNOSIS — E119 Type 2 diabetes mellitus without complications: Secondary | ICD-10-CM | POA: Insufficient documentation

## 2021-10-25 DIAGNOSIS — R519 Headache, unspecified: Secondary | ICD-10-CM | POA: Insufficient documentation

## 2021-10-25 DIAGNOSIS — K219 Gastro-esophageal reflux disease without esophagitis: Secondary | ICD-10-CM | POA: Insufficient documentation

## 2021-10-25 DIAGNOSIS — K449 Diaphragmatic hernia without obstruction or gangrene: Secondary | ICD-10-CM | POA: Diagnosis not present

## 2021-10-25 DIAGNOSIS — G8918 Other acute postprocedural pain: Secondary | ICD-10-CM | POA: Diagnosis not present

## 2021-10-25 DIAGNOSIS — M75121 Complete rotator cuff tear or rupture of right shoulder, not specified as traumatic: Secondary | ICD-10-CM | POA: Diagnosis not present

## 2021-10-25 DIAGNOSIS — F909 Attention-deficit hyperactivity disorder, unspecified type: Secondary | ICD-10-CM | POA: Insufficient documentation

## 2021-10-25 DIAGNOSIS — G43909 Migraine, unspecified, not intractable, without status migrainosus: Secondary | ICD-10-CM | POA: Insufficient documentation

## 2021-10-25 DIAGNOSIS — M7541 Impingement syndrome of right shoulder: Secondary | ICD-10-CM | POA: Insufficient documentation

## 2021-10-25 DIAGNOSIS — M7581 Other shoulder lesions, right shoulder: Secondary | ICD-10-CM | POA: Diagnosis not present

## 2021-10-25 DIAGNOSIS — E785 Hyperlipidemia, unspecified: Secondary | ICD-10-CM | POA: Diagnosis not present

## 2021-10-25 DIAGNOSIS — I10 Essential (primary) hypertension: Secondary | ICD-10-CM | POA: Diagnosis not present

## 2021-10-25 HISTORY — DX: Attention-deficit hyperactivity disorder, unspecified type: F90.9

## 2021-10-25 LAB — GLUCOSE, CAPILLARY
Glucose-Capillary: 76 mg/dL (ref 70–99)
Glucose-Capillary: 106 mg/dL — ABNORMAL HIGH (ref 70–99)

## 2021-10-25 SURGERY — SHOULDER ARTHROSCOPY WITH SUBACROMIAL DECOMPRESSION AND DISTAL CLAVICLE EXCISION
Anesthesia: General | Laterality: Right

## 2021-10-25 MED ORDER — ASPIRIN 325 MG PO TBEC: 325.0000 mg | DELAYED_RELEASE_TABLET | Freq: Every day | ORAL | 0 refills | Status: AC

## 2021-10-25 MED ORDER — LIDOCAINE HCL (CARDIAC) PF 100 MG/5ML IV SOSY
PREFILLED_SYRINGE | INTRAVENOUS | Status: DC | PRN
  Administered 2021-10-25: 60 mg via INTRAVENOUS

## 2021-10-25 MED ORDER — ONDANSETRON HCL 4 MG/2ML IJ SOLN
INTRAMUSCULAR | Status: DC | PRN
  Administered 2021-10-25: 4 mg via INTRAVENOUS

## 2021-10-25 MED ORDER — SUCCINYLCHOLINE CHLORIDE 200 MG/10ML IV SOSY
PREFILLED_SYRINGE | INTRAVENOUS | Status: DC | PRN
  Administered 2021-10-25: 80 mg via INTRAVENOUS

## 2021-10-25 MED ORDER — PROPOFOL 500 MG/50ML IV EMUL
INTRAVENOUS | Status: DC | PRN
  Administered 2021-10-25: 100 mg via INTRAVENOUS

## 2021-10-25 MED ORDER — PHENYLEPHRINE HCL (PRESSORS) 10 MG/ML IV SOLN
INTRAVENOUS | Status: DC | PRN
  Administered 2021-10-25 (×5): 50 ug via INTRAVENOUS
  Administered 2021-10-25: 100 ug via INTRAVENOUS

## 2021-10-25 MED ORDER — BUPIVACAINE LIPOSOME 1.3 % IJ SUSP
INTRAMUSCULAR | Status: DC | PRN
  Administered 2021-10-25: 7 mL via PERINEURAL
  Administered 2021-10-25: 13 mL via PERINEURAL

## 2021-10-25 MED ORDER — FENTANYL CITRATE (PF) 100 MCG/2ML IJ SOLN
INTRAMUSCULAR | Status: DC | PRN
  Administered 2021-10-25 (×3): 50 ug via INTRAVENOUS

## 2021-10-25 MED ORDER — OXYCODONE HCL 5 MG PO TABS: 5.0000 mg | ORAL_TABLET | ORAL | 0 refills | Status: DC | PRN

## 2021-10-25 MED ORDER — LACTATED RINGERS IV SOLN: INTRAVENOUS | Status: DC

## 2021-10-25 MED ORDER — ACETAMINOPHEN 500 MG PO TABS: 1000.0000 mg | ORAL_TABLET | Freq: Three times a day (TID) | ORAL | 2 refills | Status: DC

## 2021-10-25 MED ORDER — LACTATED RINGERS IR SOLN
Status: DC | PRN
  Administered 2021-10-25: 24000 mL
  Administered 2021-10-25: 3000 mL

## 2021-10-25 MED ORDER — ONDANSETRON 4 MG PO TBDP: 4.0000 mg | ORAL_TABLET | Freq: Three times a day (TID) | ORAL | 0 refills | Status: DC | PRN

## 2021-10-25 MED ORDER — LACTATED RINGERS IV SOLN
INTRAVENOUS | Status: DC | PRN
  Administered 2021-10-25: 4 mL

## 2021-10-25 MED ORDER — DEXTROSE 50 % IV SOLN
25.0000 mL | Freq: Once | INTRAVENOUS | Status: AC
  Administered 2021-10-25: 25 mL via INTRAVENOUS

## 2021-10-25 MED ORDER — CEFAZOLIN SODIUM-DEXTROSE 2-4 GM/100ML-% IV SOLN
2.0000 g | INTRAVENOUS | Status: AC
  Administered 2021-10-25: 2 g via INTRAVENOUS

## 2021-10-25 MED ORDER — DEXMEDETOMIDINE HCL IN NACL 200 MCG/50ML IV SOLN
INTRAVENOUS | Status: DC | PRN
  Administered 2021-10-25 (×5): 4 ug via INTRAVENOUS

## 2021-10-25 MED ORDER — EPHEDRINE SULFATE (PRESSORS) 50 MG/ML IJ SOLN
INTRAMUSCULAR | Status: DC | PRN
  Administered 2021-10-25: 5 mg via INTRAVENOUS
  Administered 2021-10-25 (×2): 10 mg via INTRAVENOUS

## 2021-10-25 MED ORDER — DEXAMETHASONE SODIUM PHOSPHATE 4 MG/ML IJ SOLN
INTRAMUSCULAR | Status: DC | PRN
  Administered 2021-10-25: 4 mg via INTRAVENOUS

## 2021-10-25 MED ORDER — GLYCOPYRROLATE 0.2 MG/ML IJ SOLN
INTRAMUSCULAR | Status: DC | PRN
  Administered 2021-10-25 (×2): .1 mg via INTRAVENOUS

## 2021-10-25 MED ORDER — MIDAZOLAM HCL 2 MG/2ML IJ SOLN
INTRAMUSCULAR | Status: DC | PRN
  Administered 2021-10-25 (×2): 1 mg via INTRAVENOUS

## 2021-10-25 MED ORDER — BUPIVACAINE HCL (PF) 0.5 % IJ SOLN
INTRAMUSCULAR | Status: DC | PRN
  Administered 2021-10-25: 7 mL via PERINEURAL
  Administered 2021-10-25: 3 mL via PERINEURAL

## 2021-10-25 SURGICAL SUPPLY — 59 items
ADH SKN CLS APL DERMABOND .7 (GAUZE/BANDAGES/DRESSINGS)
ADPR IRR PORT MULTIBAG TUBE (MISCELLANEOUS) ×2
ANCH SUT 2 SWLK 19.1 CLS EYLT (Anchor) ×2 IMPLANT
ANCH SUT 5 3.9 CRKSW KNTLS (Anchor) ×1 IMPLANT
ANCHOR 3.9 PEEK CORKSCREW 5MTS (Anchor) IMPLANT
ANCHOR ICONIX SPEED 2.3 (Anchor) IMPLANT
ANCHOR SWIVELOCK BIO 4.75X19.1 (Anchor) IMPLANT
APL PRP STRL LF DISP 70% ISPRP (MISCELLANEOUS) ×1
BLADE SHAVER 4.5X7 STR FR (MISCELLANEOUS) ×1 IMPLANT
BUR BR 5.5 WIDE MOUTH (BURR) ×1 IMPLANT
CANNULA PART THRD DISP 5.75X7 (CANNULA) IMPLANT
CANNULA PARTIAL THREAD 2X7 (CANNULA) IMPLANT
CANNULA TWIST IN 8.25X7CM (CANNULA) IMPLANT
CHLORAPREP W/TINT 26 (MISCELLANEOUS) ×1 IMPLANT
COOLER POLAR GLACIER W/PUMP (MISCELLANEOUS) ×1 IMPLANT
COVER LIGHT HANDLE UNIVERSAL (MISCELLANEOUS) ×2 IMPLANT
DERMABOND ADVANCED .7 DNX12 (GAUZE/BANDAGES/DRESSINGS) IMPLANT
DRAPE INCISE IOBAN 66X45 STRL (DRAPES) ×1 IMPLANT
DRAPE U-SHAPE 48X52 POLY STRL (PACKS) ×1 IMPLANT
DRSG TEGADERM 4X4.75 (GAUZE/BANDAGES/DRESSINGS) ×3 IMPLANT
ELECT REM PT RETURN 9FT ADLT (ELECTROSURGICAL)
ELECTRODE REM PT RTRN 9FT ADLT (ELECTROSURGICAL) IMPLANT
GAUZE SPONGE 4X4 12PLY STRL (GAUZE/BANDAGES/DRESSINGS) ×1 IMPLANT
GAUZE XEROFORM 1X8 LF (GAUZE/BANDAGES/DRESSINGS) ×1 IMPLANT
GLOVE SRG 8 PF TXTR STRL LF DI (GLOVE) ×3 IMPLANT
GLOVE SURG ENC MOIS LTX SZ7.5 (GLOVE) ×4 IMPLANT
GLOVE SURG SYN 8.0 (GLOVE) ×2 IMPLANT
GLOVE SURG SYN 8.0 PF PI (GLOVE) IMPLANT
GLOVE SURG UNDER POLY LF SZ8 (GLOVE) ×3
GOWN STRL REIN 2XL XLG LVL4 (GOWN DISPOSABLE) ×1 IMPLANT
GOWN STRL REUS W/ TWL LRG LVL3 (GOWN DISPOSABLE) ×3 IMPLANT
GOWN STRL REUS W/TWL LRG LVL3 (GOWN DISPOSABLE) ×3
IV LACTATED RINGER IRRG 3000ML (IV SOLUTION) ×9
IV LR IRRIG 3000ML ARTHROMATIC (IV SOLUTION) ×6 IMPLANT
KIT CORKSCREW KNTLS 3.9 S/T/P (INSTRUMENTS) IMPLANT
KIT STABILIZATION SHOULDER (MISCELLANEOUS) ×1 IMPLANT
KIT TURNOVER KIT A (KITS) ×1 IMPLANT
MANIFOLD NEPTUNE II (INSTRUMENTS) ×1 IMPLANT
MASK FACE SPIDER DISP (MASK) ×1 IMPLANT
MAT GRAY ABSORB FLUID 28X50 (MISCELLANEOUS) ×2 IMPLANT
PACK ARTHROSCOPY SHOULDER (MISCELLANEOUS) ×1 IMPLANT
PAD ABD DERMACEA PRESS 5X9 (GAUZE/BANDAGES/DRESSINGS) ×2 IMPLANT
PAD WRAPON POLAR SHDR XLG (MISCELLANEOUS) ×1 IMPLANT
PASSER SUT FIRSTPASS SELF (INSTRUMENTS) IMPLANT
SET Y ADAPTER MULIT-BAG IRRIG (MISCELLANEOUS) ×2 IMPLANT
SPONGE T-LAP 18X18 ~~LOC~~+RFID (SPONGE) ×1 IMPLANT
SUT ETHILON 3-0 FS-10 30 BLK (SUTURE) ×2
SUT MNCRL 4-0 (SUTURE)
SUT MNCRL 4-0 27XMFL (SUTURE)
SUT VIC AB 0 CT1 36 (SUTURE) ×1 IMPLANT
SUT VIC AB 2-0 CT2 27 (SUTURE) IMPLANT
SUTURE EHLN 3-0 FS-10 30 BLK (SUTURE) ×1 IMPLANT
SUTURE MNCRL 4-0 27XMF (SUTURE) IMPLANT
TAPE MICROFOAM 4IN (TAPE) ×1 IMPLANT
TUBING CONNECTING 10 (TUBING) ×1 IMPLANT
TUBING INFLOW SET DBFLO PUMP (TUBING) ×1 IMPLANT
TUBING OUTFLOW SET DBLFO PUMP (TUBING) ×1 IMPLANT
WAND WEREWOLF FLOW 90D (MISCELLANEOUS) ×1 IMPLANT
WRAPON POLAR PAD SHDR XLG (MISCELLANEOUS) ×1

## 2021-10-25 NOTE — Transfer of Care (Signed)
Immediate Anesthesia Transfer of Care Note  Patient: Kristin [name]  Procedure(s) Performed: Right shoulder arthroscopic rotator cuff repair and distal clavicle excision (Right)  Patient Location: PACU  Anesthesia Type: General ETT  Level of Consciousness: awake, alert  and patient cooperative  Airway and Oxygen Therapy: Patient Spontanous Breathing and Patient connected to supplemental oxygen  Post-op Assessment: Post-op Vital signs reviewed, Patient's Cardiovascular Status Stable, Respiratory Function Stable, Patent Airway and No signs of Nausea or vomiting  Post-op Vital Signs: Reviewed and stable  Complications: No notable events documented.

## 2021-10-25 NOTE — Anesthesia Postprocedure Evaluation (Signed)
Anesthesia Post Note  Patient: Kristin [name]  Procedure(s) Performed: Right shoulder arthroscopic rotator cuff repair and distal clavicle excision (Right)  Patient location during evaluation: PACU Anesthesia Type: General Level of consciousness: awake and alert Pain management: pain level controlled Vital Signs Assessment: post-procedure vital signs reviewed and stable Respiratory status: spontaneous breathing, nonlabored ventilation, respiratory function stable and patient connected to nasal cannula oxygen Cardiovascular status: blood pressure returned to baseline and stable Postop Assessment: no apparent nausea or vomiting Anesthetic complications: no   No notable events documented.   Last Vitals:  Vitals:   10/25/21 1000 10/25/21 1015  BP: 93/64 101/61  Pulse: 88 84  Resp: 16 13  Temp:  (!) 36.4 C  SpO2: 97% 97%    Last Pain:  Vitals:   10/25/21 1015  PainSc: 0-No pain                 Precious Haws [name]

## 2021-10-25 NOTE — Anesthesia Procedure Notes (Signed)
Procedure Name: Intubation Date/Time: 10/25/2021 7:45 AM  Performed by: Moises Blood, CRNAPre-anesthesia Checklist: Patient identified, Emergency Drugs available, Suction available, Patient being monitored and Timeout performed Patient Re-evaluated:Patient Re-evaluated prior to induction Oxygen Delivery Method: Circle system utilized Preoxygenation: Pre-oxygenation with 100% oxygen Induction Type: IV induction Ventilation: Mask ventilation without difficulty Laryngoscope Size: Mac and 3 Grade View: Grade II Tube type: Oral Number of attempts: 1 Placement Confirmation: ETT inserted through vocal cords under direct vision, positive ETCO2 and breath sounds checked- equal and bilateral Secured at: 21 cm Tube secured with: Tape Dental Injury: Teeth and Oropharynx as per pre-operative assessment

## 2021-10-25 NOTE — Anesthesia Preprocedure Evaluation (Signed)
Anesthesia Evaluation  Patient identified by MRN, date of birth, ID band Patient awake    Reviewed: Allergy & Precautions, NPO status , Patient's Chart, lab work & pertinent test results  History of Anesthesia Complications (+) PONV and history of anesthetic complications  Airway Mallampati: III  TM Distance: <3 FB Neck ROM: full    Dental  (+) Chipped   Pulmonary sleep apnea ,    Pulmonary exam normal        Cardiovascular hypertension, negative cardio ROS Normal cardiovascular exam     Neuro/Psych  Headaches, PSYCHIATRIC DISORDERS  Neuromuscular disease    GI/Hepatic Neg liver ROS, hiatal hernia, GERD  Controlled,  Endo/Other  diabetes, Type 2  Renal/GU      Musculoskeletal   Abdominal   Peds  Hematology negative hematology ROS (+)   Anesthesia Other Findings Past Medical History: No date: ADHD No date: Allergy No date: Arthritis No date: Basal cell carcinoma     Comment:  nose, left cheek, right upper lip, right posterior               shoulder most recent 6 years ago on left cheek 03/26/2021: Basal cell carcinoma     Comment:  L chest, EDC 04/24/2021 No date: Bleeding No date: Chicken pox No date: Chronic diarrhea No date: Complication of anesthesia     Comment:  blood pressure elevated after gastric sleeve surgery in               recovery, pt does not want mask over face; migraines No date: COVID-19     Comment:  03/01/20 had MAB 03/03/20 and felt better next day 03/04/20 No date: Depression No date: Diabetes mellitus without complication (HCC)     Comment:  pre-diabetic currently, was diabetic prior to gastric               sleeve surgery  No date: Disorder of right rotator cuff     Comment:  10/03/19 MRI right shoulder abnormal Dr. Maurine Cane No date: Fall     Comment:  08/2020 No date: Fall     Comment:  03/28/21 No date: Fatty liver No date: Gout 10/15/2018: Hematuria 06/19/2021: History of  shingles     Comment:  lower back and R side of thigh No date: Hyperlipidemia No date: Hypertension     Comment:  no problems since losing weight after gastric sleeve               surgery No date: IBS (irritable bowel syndrome) No date: Insomnia No date: Migraine No date: Neuropathy     Comment:  bilateral feet No date: Obesity No date: Osteopenia No date: PONV (postoperative nausea and vomiting) 03/26/2021: Squamous cell carcinoma of skin     Comment:  SCC IS R mid sternum, [facility] 04/24/2021  Past Surgical History: No date: ABDOMINAL HYSTERECTOMY     Comment:  no h/o abnormal pap still has 1 ovary left 05/29/2016: ANKLE ARTHROSCOPY; Left     Comment:  Procedure: ANKLE ARTHROSCOPY;  Surgeon: [name],               DPM;  Location: [place];  Service: Podiatry;  Laterality:               Left; No date: APPENDECTOMY No date: basal cell removed No date: CESAREAN SECTION; N/A     Comment:  [place] No date: CHOLECYSTECTOMY 01/31/2019: COLONOSCOPY WITH PROPOFOL; N/A     Comment:  Procedure:  COLONOSCOPY WITH PROPOFOL;  Surgeon: Lin Landsman, MD;  Location: [facility] ENDOSCOPY;  Service:               Gastroenterology;  Laterality: N/A; No date: cyst removed from wrist 01/31/2019: ESOPHAGOGASTRODUODENOSCOPY (EGD) WITH PROPOFOL; N/A     Comment:  Procedure: ESOPHAGOGASTRODUODENOSCOPY (EGD) WITH               PROPOFOL;  Surgeon: Lin Landsman, MD;  Location:               [place];  Service: Gastroenterology;  Laterality:               N/A; 05/21/2020: FLEXIBLE SIGMOIDOSCOPY; N/A     Comment:  Procedure: FLEXIBLE SIGMOIDOSCOPY;  Surgeon: Lin Landsman, MD;  Location: [facility] ENDOSCOPY;  Service:               Gastroenterology;  Laterality: N/A; No date: FOOT SURGERY     Comment:  left foot 11/2016 Dr. Laymond Purser  No date: left elbow surgery     Comment:  07/2017 Dr Leanor Kail epicondylitis  08/27/2021: LYSIS OF ADHESION; Right      Comment:  Procedure: Right shoulder arthroscopic lysis of               adhesions, capsular release, and manipulation under               anesthesia with corticosteroid injection and biceps               tenodesis;  Surgeon: Leim Fabry, MD;  Location: [place];  Service: Orthopedics;  Laterality: Right; No date: MOHS SURGERY     Comment:  x 2 face nose and left cheek  05/29/2016: OSTEOCHONDROMA EXCISION; Left     Comment:  Procedure: OSTEOCHONDRAL DRILLING TALUS;  Surgeon:               [name], DPM;  Location: [place] [facility];  Service:               Podiatry;  Laterality: Left; 05/29/2016: PLANTAR FASCIA RELEASE; Left     Comment:  Procedure: ENDOSCOPIC PLANTAR FASCIOTOMY;  Surgeon:               [name], DPM;  Location: [place];  Service:               Podiatry;  Laterality: Left; No date: ROTATOR CUFF REPAIR; Left 2017: STOMACH SURGERY     Comment:  gastric sleeve surgery 08/05/2017: TENNIS ELBOW RELEASE/NIRSCHEL PROCEDURE; Left     Comment:  Procedure: TENNIS ELBOW RELEASE/NIRSCHEL PROCEDURE;                Surgeon: Leanor Kail, MD;  Location: [facility] ORS;                Service: Orthopedics;  Laterality: Left; No date: TUBAL LIGATION 1982: wedge resection of ovary     Reproductive/Obstetrics negative OB ROS                             Anesthesia Physical Anesthesia Plan  ASA: 3  Anesthesia Plan: General ETT   Post-op Pain Management: Regional block  Induction: Intravenous  PONV Risk Score and Plan: Ondansetron, Dexamethasone, Midazolam and Treatment may vary due to age or medical condition  Airway Management Planned: Oral ETT  Additional Equipment:   Intra-op Plan:   Post-operative Plan: Extubation in OR  Informed Consent: I have reviewed the patients History and Physical, chart, labs and discussed the procedure including the risks, benefits and alternatives for the proposed anesthesia with the patient  or authorized representative who has indicated his/her understanding and acceptance.     Dental Advisory Given  Plan Discussed with: Anesthesiologist, CRNA and Surgeon  Anesthesia Plan Comments: (Patient consented for risks of anesthesia including but not limited to:  - adverse reactions to medications - damage to eyes, teeth, lips or other oral mucosa - nerve damage due to positioning  - sore throat or hoarseness - Damage to heart, brain, nerves, lungs, other parts of body or loss of life  Patient voiced understanding.)        Anesthesia Quick Evaluation

## 2021-10-25 NOTE — Op Note (Addendum)
SURGERY DATE: 10/25/2021   PRE-OP DIAGNOSIS:  1. Right subacromial impingement 2. Right rotator cuff tear 3. Right acromioclavicular joint arthritis  POST-OP DIAGNOSIS: 1. Right subacromial impingement 2. Right rotator cuff tear 3. Right acromioclavicular joint arthritis  PROCEDURES:  1. Right arthroscopic rotator cuff repair (subscapularis and supraspinatus) 2. Right arthroscopic subacromial decompression 3. Right arthroscopic extensive debridement of shoulder (glenohumeral and subacromial spaces) 4. Right arthroscopic distal clavicle excision  SURGEON: Cato Mulligan, MD   ASSISTANT: Anitra Lauth, PA; Evette Georges, PA-S    ANESTHESIA: Gen with Exparel interscalene block   ESTIMATED BLOOD LOSS: 5cc   DRAINS:  none   TOTAL IV FLUIDS: per anesthesia      SPECIMENS: none   IMPLANTS:  - Arthrex 3.92m Knotless Corkscrew x 1 - Arthrex 4.718mSwiveLock x 2 - Iconix SPEED double loaded with 1.2 and 2.37m59mape x 2     OPERATIVE FINDINGS:  Examination under anesthesia: A careful examination under anesthesia was performed.  Passive range of motion was: FF: 150; ER at side: 50; ER in abduction: 90; IR in abduction: 45.  Anterior load shift: NT.  Posterior load shift: NT.  Sulcus in neutral: NT.  Sulcus in ER: NT.     Intra-operative findings: A thorough arthroscopic examination of the shoulder was performed.  The findings are: 1. Biceps tendon: Not visualized in the joint 2. Superior labrum: erythema 3. Posterior labrum and capsule: normal 4. Inferior capsule and inferior recess: normal 5. Glenoid cartilage surface: Normal 6. Supraspinatus attachment: full-thickness tear of the supraspinatus 7. Posterior rotator cuff attachment: normal 8. Humeral head articular cartilage: normal 9. Rotator interval: Moderate synovitis 10: Subscapularis tendon: Partial-thickness tear of the upper border 11. Anterior labrum: Mildly degenerative 12. IGHL: normal   OPERATIVE REPORT:     Indications for procedure:  CatDABRIA WADAS a [age]o. female who had a significant history of right shoulder adhesive capsulitis.  She underwent right shoulder capsule release and manipulation under anesthesia with arthroscopic biceps tenodesis on 08/27/2021.  This procedure significantly improved her motion.  However, she was found to have an intraoperative full-thickness supraspinatus tear, and she did remain symptomatic from the torn rotator cuff.  Given these findings, we agreed to proceed with surgery after a full discussion of risks, benefits, and alternatives to surgery, including possible recurrent postoperative capsulitis.    Procedure in detail:   I identified CatSiyah Maultookins in the pre-operative holding area.  I marked the operative shoulder with my initials. I reviewed the risks and benefits of the proposed surgical intervention, and the patient wished to proceed.  Anesthesia was then performed with an Exparel interscalene block.  The patient was transferred to the operative suite and placed in the beach chair position.     Appropriate IV antibiotics were administered prior to incision. The operative upper extremity was then prepped and draped in standard fashion. A time out was performed confirming the correct extremity, correct patient, and correct procedure.    I then created a standard posterior portal with an 11 blade. The glenohumeral joint was easily entered with a blunt trocar and the arthroscope introduced. The findings of diagnostic arthroscopy are described above. I debrided degenerative tissue including the synovitic tissue about the rotator interval and anterior and superior labrum. I then coagulated the inflamed synovium to obtain hemostasis and reduce the risk of post-operative swelling using an Arthrocare radiofrequency device.  Next, arthroscopic repair of the subscapularis was performed. The lesser tuberosity footprint was prepared with  a combination of  electrocautery and an arthroscopic curette.  An Arthrex knotless corkscrew was placed into the lesser tuberosity footprint from the anterior portal.  A BirdBeak was used to shuttle the repair suture through the upper border of the subscapularis tendon.  The suture was then shuttled through the anchor. With the arm in neutral rotation, the repair was tensioned appropriately. This appropriately reduced the subscapularis tear.  The arm was then internally and externally rotated and the subscapularis was noted to move appropriately with rotation.  The remainder of the suture was then cut.  Next, the arthroscope was then introduced into the subacromial space. A direct lateral portal was created with an 11-blade after spinal needle localization. An extensive subacromial bursectomy and debridement was performed using a combination of the shaver and Arthrocare wand. The entire acromial undersurface was exposed and the CA ligament was subperiosteally elevated to expose the anterior acromial hook. A burr was used to create a flat anterior and lateral aspect of the acromion, converting it from a Type 2 to a Type 1 acromion. Care was made to keep the deltoid fascia intact.   I then turned my attention to the arthroscopic distal clavicle excision. I identified the acromioclavicular joint. Surrounding bursal tissue was debrided and the edges of the joint were identified. I used the 5.49m barrel burr to remove the distal clavicle parallel to the edge of the acromion. I was able to fit two widths of the burr into the space between the distal clavicle and acromion, signifying that I had removed ~182mof distal clavicle. This was confirmed by viewing anteriorly and introducing a probe with measuring marks from the lateral portal. Hemostasis was achieved with an Arthrocare wand.   Next, I created an accessory posterolateral portal to assist with visualization and instrumentation.  I debrided the poor quality edges of the  supraspinatus tendon.  This was a U-shaped tear of the supraspinatus.  I prepared the footprint using a burr to expose bleeding bone.     I then percutaneously placed 1 Iconix SPEED medial row anchor along the anterior portion of the tear at the articular margin. Another SPEED anchor was placed along the posterior portion of the tear at the articular margin. I then shuttled all 8 strands of tape through the rotator cuff just lateral to the musculotendinous junction using a FirstPass suture passer spanning the anterior to posterior extent of the tear. The posterior strands of each suture were passed through an ArHCA Incnchor.  This was placed approximately 2 cm distal to the lateral edge of the footprint in line with the posterior aspect of the tear with appropriate tensioning of each suture prior to final fixation.  Similarly, the anterior strands of each suture were passed through another SwiveLock anchor along the anterior margin of the tear. This construct allowed for excellent reapproximation of the rotator cuff to its native footprint without undue tension.  Appropriate compression was achieved.  The repair was stable to external and internal rotation.   Fluid was evacuated from the shoulder, and the portals were closed with 3-0 Nylon. Xeroform was applied to the portals. A sterile dressing was applied, followed by a Polar Care sleeve and a SlingShot shoulder immobilizer/sling. The patient was awakened from anesthesia without difficulty and was transferred to the PACU in stable condition.    Of note, assistance from a PA was essential to performing the surgery.  PA was present for the entire surgery.  PA assisted with patient positioning, retraction, instrumentation,  and wound closure. The surgery would have been more difficult and had longer operative time without PA assistance.   Additionally, this case had increased complexity compared to standard arthroscopic rotator cuff repair given the  involvement of the subscapularis tear. Repair of this tear increased surgical time by 20 minutes and increased complexity due to additional preparation and repair of subscapularis tear and use of additional implants, which would have otherwise not occurred.  COMPLICATIONS: none   DISPOSITION: plan for discharge home after recovery in PACU     POSTOPERATIVE PLAN: Remain in sling (except hygiene and elbow/wrist/hand RoM exercises as instructed by PT) x 6 weeks and NWB for this time. PT to begin 3-4 days after surgery.  Large rotator cuff repair rehab protocol with subscapularis restrictions. ASA '325mg'$  daily x 2 weeks for DVT ppx.

## 2021-10-25 NOTE — H&P (Signed)
Paper H&P to be scanned into permanent record. H&P reviewed. No significant changes noted.  

## 2021-10-25 NOTE — Discharge Instructions (Addendum)
Post-Op Instructions - Rotator Cuff Repair  1. Bracing: You will wear a shoulder immobilizer or sling for 6 weeks.   2. Driving: No driving for 3 weeks post-op. When driving, do not wear the immobilizer. Ideally, we recommend no driving for 6 weeks while sling is in place as one arm will be immobilized.   3. Activity: No active lifting for 2 months. Wrist, hand, and elbow motion only. Avoid lifting the upper arm away from the body except for hygiene. You are permitted to bend and straighten the elbow passively only (no active elbow motion). You may use your hand and wrist for typing, writing, and managing utensils (cutting food). Do not lift more than a coffee cup for 8 weeks.  When sleeping or resting, inclined positions (recliner chair or wedge pillow) and a pillow under the forearm for support may provide better comfort for up to 4 weeks.  Avoid long distance travel for 4 weeks.  Return to normal activities after rotator cuff repair repair normally takes 6 months on average. If rehab goes very well, may be able to do most activities at 4 months, except overhead or contact sports.  4. Physical Therapy: Begins 3-4 days after surgery, and proceed 1 time per week for the first 6 weeks, then 1-2 times per week from weeks 6-20 post-op.  5. Medications:  - You will be provided a prescription for narcotic pain medicine. After surgery, take 1-2 narcotic tablets every 4 hours if needed for severe pain.  - A prescription for anti-nausea medication will be provided in case the narcotic medicine causes nausea - take 1 tablet every 6 hours only if nauseated.   - Take tylenol 1000 mg (2 Extra Strength tablets or 3 regular strength) every 8 hours for pain.  May decrease or stop tylenol 5 days after surgery if you are having minimal pain. - Take ASA 325mg/day x 2 weeks to help prevent DVTs/PEs (blood clots).  - DO NOT take ANY nonsteroidal anti-inflammatory pain medications (Advil, Motrin, Ibuprofen, Aleve,  Naproxen, or Naprosyn). These medicines can inhibit healing of your shoulder repair.    If you are taking prescription medication for anxiety, depression, insomnia, muscle spasm, chronic pain, or for attention deficit disorder, you are advised that you are at a higher risk of adverse effects with use of narcotics post-op, including narcotic addiction/dependence, depressed breathing, death. If you use non-prescribed substances: alcohol, marijuana, cocaine, heroin, methamphetamines, etc., you are at a higher risk of adverse effects with use of narcotics post-op, including narcotic addiction/dependence, depressed breathing, death. You are advised that taking > 50 morphine milligram equivalents (MME) of narcotic pain medication per day results in twice the risk of overdose or death. For your prescription provided: oxycodone 5 mg - taking more than 6 tablets per day would result in > 50 morphine milligram equivalents (MME) of narcotic pain medication. Be advised that we will prescribe narcotics short-term, for acute post-operative pain only - 3 weeks for major operations such as shoulder repair/reconstruction surgeries.     6. Post-Op Appointment:  Your first post-op appointment will be 10-14 days post-op.  7. Work or School: For most, but not all procedures, we advise staying out of work or school for at least 1 to 2 weeks in order to recover from the stress of surgery and to allow time for healing.   If you need a work or school note this can be provided.   8. Smoking: If you are a smoker, you need to refrain from   smoking in the postoperative period. The nicotine in cigarettes will inhibit healing of your shoulder repair and decrease the chance of successful repair. Similarly, nicotine containing products (gum, patches) should be avoided.   Post-operative Brace: Apply and remove the brace you received as you were instructed to at the time of fitting and as described in detail as the brace's  instructions for use indicate.  Wear the brace for the period of time prescribed by your physician.  The brace can be cleaned with soap and water and allowed to air dry only.  Should the brace result in increased pain, decreased feeling (numbness/tingling), increased swelling or an overall worsening of your medical condition, please contact your doctor immediately.  If an emergency situation occurs as a result of wearing the brace after normal business hours, please dial 911 and seek immediate medical attention.  Let your doctor know if you have any further questions about the brace issued to you. Refer to the shoulder sling instructions for use if you have any questions regarding the correct fit of your shoulder sling.  [facility] for Troubleshooting: [phone]  Video that illustrates how to properly use a shoulder sling: "Instructions for Proper Use of an Orthopaedic Sling" [email or web address]       Information for Discharge Teaching: EXPAREL (bupivacaine liposome injectable suspension)   Your surgeon or anesthesiologist gave you EXPAREL(bupivacaine) to help control your pain after surgery.  EXPAREL is a local anesthetic that provides pain relief by numbing the tissue around the surgical site. EXPAREL is designed to release pain medication over time and can control pain for up to 72 hours. Depending on how you respond to EXPAREL, you may require less pain medication during your recovery.  Possible side effects: Temporary loss of sensation or ability to move in the area where bupivacaine was injected. Nausea, vomiting, constipation Rarely, numbness and tingling in your mouth or lips, lightheadedness, or anxiety may occur. Call your doctor right away if you think you may be experiencing any of these sensations, or if you have other questions regarding possible side effects.  Follow all other discharge instructions given to you by your surgeon or nurse.  Eat a healthy diet and drink plenty of water or other fluids.  If you return to the hospital for any reason within 96 hours following the administration of EXPAREL, it is important for health care providers to know that you have received this anesthetic. A teal colored band has been placed on your arm with the date, time and amount of EXPAREL you have received in order to alert and inform your health care providers. Please leave this armband in place for the full 96 hours following administration, and then you may remove the band.  PERIPHERAL NERVE BLOCK PATIENT INFORMATION  Your surgeon has requested a peripheral nerve block for your surgery. This anesthetic technique provides excellent post-operative pain relief for you in a safe and effective manner. It will also help reduce the risk of nausea and vomiting and allow earlier discharge from the hospital.   The block is performed under sedation with ultrasound guidance prior to your procedure. Due to the sedation, your may or may not remember the block experience. The nerve block will begin to take effect anywhere from 5 to 30 minutes after being administered. You will be transported to the operating room from your surgery after the block is completed.   At the end of surgery, when the anesthesia wears off, you will notice a few things. Your may  not be able to move or feel the part of your body targeted by the nerve block. These are normal experiences, and they will disappear as the block wears off.  If you had an interscalene nerve block performed (which is common for shoulder surgery), your voice can be very hoarse and you may feel that you are not able to take as deep a breath as you did before surgery. Some patients may also notice a droopy eyelid on the affected side. These symptoms will resolve once the block wears off.  Pain control: The nerve block technique used is a single injection that can last anywhere from 1-3 days. The duration of the  numbness can vary between individuals. After leaving the hospital, it is important that you begin to take your prescribed pain medication when you start to sense the nerve block wearing off. This will help you avoid unpleasant pain at the time the nerve block wears off, which can sometimes be in the middle of the night. The block will only cover pain in the areas targeted by the nerve block so if you experience surgical pain outside of that area, please take your prescribed pain medication. Management of the "numb area": After a nerve block, you cannot feel pain, pressure, or temperature in the affected area so there is an increased risk for injury. You should take extra care to protect the affected areas until sensation and movement returns. Please take caution to not come in contact with extremely hot or cold items because you will not be able to sense or protect yourself form the extremes of temperature.  You may experience some persistent numbness after the procedure by most neurological deficits resolve over time and the incidence of serious long term neurological complications attributable to peripheral nerve blocks are relatively uncommon.    POLAR CARE INFORMATION  [email or web address].com/  How to use [place] Cold Therapy System?  YouTube   BargainHeads.tn  OPERATING INSTRUCTIONS  Start the product With dry hands, connect the transformer to the electrical connection located on the top of the cooler. Next, plug the transformer into an appropriate electrical outlet. The unit will automatically start running at this point.  To stop the pump, disconnect electrical power.  Unplug to stop the product when not in use. Unplugging the Polar Care unit turns it off. Always unplug immediately after use. Never leave it plugged in while unattended. Remove pad.    FIRST ADD WATER TO FILL LINE, THEN ICE---Replace ice when existing ice is almost melted  1 Discuss Treatment  with your [facility] Practitioner and Use Only as Prescribed 2 Apply Insulation Barrier & Cold Therapy Pad 3 Check for Moisture 4 Inspect Skin Regularly  Tips and Trouble Shooting Usage Tips 1. Use cubed or chunked ice for optimal performance. 2. It is recommended to drain the Pad between uses. To drain the pad, hold the Pad upright with the hose pointed toward the ground. Depress the black plunger and allow water to drain out. 3. You may disconnect the Pad from the unit without removing the pad from the affected area by depressing the silver tabs on the hose coupling and gently pulling the hoses apart. The Pad and unit will seal itself and will not leak. Note: Some dripping during release is normal. 4. DO NOT RUN PUMP WITHOUT WATER! The pump in this unit is designed to run with water. Running the unit without water will cause permanent damage to the pump. 5. Unplug unit before  removing lid.  TROUBLESHOOTING GUIDE Pump not running, Water not flowing to the pad, Pad is not getting cold 1. Make sure the transformer is plugged into the wall outlet. 2. Confirm that the ice and water are filled to the indicated levels. 3. Make sure there are no kinks in the pad. 4. Gently pull on the blue tube to make sure the tube/pad junction is straight. 5. Remove the pad from the treatment site and ll it while the pad is lying at; then reapply. 6. Confirm that the pad couplings are securely attached to the unit. Listen for the double clicks (Figure 1) to confirm the pad couplings are securely attached.  Leaks    Note: Some condensation on the lines, controller, and pads is unavoidable, especially in warmer climates. 1. If using a [place] Polar Care Cold Therapy unit with a detachable Cold Therapy Pad, and a leak exists (other than condensation on the lines) disconnect the pad couplings. Make sure the silver tabs on the couplings are depressed before reconnecting the pad to the pump hose; then confirm both  sides of the coupling are properly clicked in. 2. If the coupling continues to leak or a leak is detected in the pad itself, stop using it and call [facility] at [phone].  Cleaning After use, empty and dry the unit with a soft cloth. Warm water and mild detergent may be used occasionally to clean the pump and tubes.  WARNING: The [place] can be cold enough to cause serious injury, including full skin necrosis. Follow these Operating Instructions, and carefully read the Product Insert (see pouch on side of unit) and the Cold Therapy Pad Fitting Instructions (provided with each Cold Therapy Pad) prior to use.

## 2021-10-25 NOTE — Anesthesia Procedure Notes (Signed)
Anesthesia Regional Block: Interscalene brachial plexus block   Pre-Anesthetic Checklist: , timeout performed,  Correct Patient, Correct Site, Correct Laterality,  Correct Procedure, Correct Position, site marked,  Risks and benefits discussed,  Surgical consent,  Pre-op evaluation,  At surgeon's request and post-op pain management  Laterality: Upper and Right  Prep: chloraprep       Needles:  Injection technique: Single-shot  Needle Type: Stimiplex     Needle Length: 9cm  Needle Gauge: 22     Additional Needles:   Procedures:,,,, ultrasound used (permanent image in chart),,    Narrative:  Start time: 10/25/2021 7:19 AM End time: 10/25/2021 7:22 AM Injection made incrementally with aspirations every 5 mL.  Performed by: Personally  Anesthesiologist: Raffaele Derise, Precious Haws, MD  Additional Notes: Patient consented for risk and benefits of nerve block including but not limited to nerve damage, failed block, bleeding and infection.  Patient voiced understanding.  Functioning IV was confirmed and monitors were applied.  Timeout done prior to procedure and prior to any sedation being given to the patient.  Patient confirmed procedure site prior to any sedation given to the patient.  A 25m 22ga Stimuplex needle was used. Sterile prep,hand hygiene and sterile gloves were used.  Minimal sedation used for procedure.  No paresthesia endorsed by patient during the procedure.  Negative aspiration and negative test dose prior to incremental administration of local anesthetic. The patient tolerated the procedure well with no immediate complications.

## 2021-10-28 DIAGNOSIS — M6281 Muscle weakness (generalized): Secondary | ICD-10-CM | POA: Diagnosis not present

## 2021-10-28 DIAGNOSIS — Z9889 Other specified postprocedural states: Secondary | ICD-10-CM | POA: Diagnosis not present

## 2021-10-28 DIAGNOSIS — M25511 Pain in right shoulder: Secondary | ICD-10-CM | POA: Diagnosis not present

## 2021-10-28 DIAGNOSIS — M25611 Stiffness of right shoulder, not elsewhere classified: Secondary | ICD-10-CM | POA: Diagnosis not present

## 2021-10-29 ENCOUNTER — Other Ambulatory Visit: Payer: Self-pay | Admitting: Gastroenterology

## 2021-10-31 DIAGNOSIS — F902 Attention-deficit hyperactivity disorder, combined type: Secondary | ICD-10-CM | POA: Diagnosis not present

## 2021-10-31 DIAGNOSIS — Z79899 Other long term (current) drug therapy: Secondary | ICD-10-CM | POA: Diagnosis not present

## 2021-11-20 DIAGNOSIS — M25511 Pain in right shoulder: Secondary | ICD-10-CM | POA: Diagnosis not present

## 2021-11-20 DIAGNOSIS — M6281 Muscle weakness (generalized): Secondary | ICD-10-CM | POA: Diagnosis not present

## 2021-11-20 DIAGNOSIS — Z9889 Other specified postprocedural states: Secondary | ICD-10-CM | POA: Diagnosis not present

## 2021-11-20 DIAGNOSIS — M25611 Stiffness of right shoulder, not elsewhere classified: Secondary | ICD-10-CM | POA: Diagnosis not present

## 2021-11-22 ENCOUNTER — Other Ambulatory Visit: Payer: Self-pay | Admitting: Family

## 2021-11-22 DIAGNOSIS — E1165 Type 2 diabetes mellitus with hyperglycemia: Secondary | ICD-10-CM

## 2021-12-02 DIAGNOSIS — M6281 Muscle weakness (generalized): Secondary | ICD-10-CM | POA: Diagnosis not present

## 2021-12-02 DIAGNOSIS — M25511 Pain in right shoulder: Secondary | ICD-10-CM | POA: Diagnosis not present

## 2021-12-02 DIAGNOSIS — Z9889 Other specified postprocedural states: Secondary | ICD-10-CM | POA: Diagnosis not present

## 2021-12-02 DIAGNOSIS — M25611 Stiffness of right shoulder, not elsewhere classified: Secondary | ICD-10-CM | POA: Diagnosis not present

## 2021-12-17 DIAGNOSIS — M25511 Pain in right shoulder: Secondary | ICD-10-CM | POA: Diagnosis not present

## 2021-12-17 DIAGNOSIS — Z9889 Other specified postprocedural states: Secondary | ICD-10-CM | POA: Diagnosis not present

## 2021-12-17 DIAGNOSIS — M6281 Muscle weakness (generalized): Secondary | ICD-10-CM | POA: Diagnosis not present

## 2021-12-17 DIAGNOSIS — M25611 Stiffness of right shoulder, not elsewhere classified: Secondary | ICD-10-CM | POA: Diagnosis not present

## 2021-12-20 DIAGNOSIS — Z9889 Other specified postprocedural states: Secondary | ICD-10-CM | POA: Diagnosis not present

## 2021-12-20 DIAGNOSIS — M25511 Pain in right shoulder: Secondary | ICD-10-CM | POA: Diagnosis not present

## 2021-12-20 DIAGNOSIS — M6281 Muscle weakness (generalized): Secondary | ICD-10-CM | POA: Diagnosis not present

## 2021-12-20 DIAGNOSIS — M25611 Stiffness of right shoulder, not elsewhere classified: Secondary | ICD-10-CM | POA: Diagnosis not present

## 2021-12-30 DIAGNOSIS — Z9889 Other specified postprocedural states: Secondary | ICD-10-CM | POA: Diagnosis not present

## 2021-12-30 DIAGNOSIS — M25511 Pain in right shoulder: Secondary | ICD-10-CM | POA: Diagnosis not present

## 2021-12-30 DIAGNOSIS — M25611 Stiffness of right shoulder, not elsewhere classified: Secondary | ICD-10-CM | POA: Diagnosis not present

## 2021-12-30 DIAGNOSIS — M6281 Muscle weakness (generalized): Secondary | ICD-10-CM | POA: Diagnosis not present

## 2022-01-04 ENCOUNTER — Other Ambulatory Visit: Payer: Self-pay | Admitting: Neurology

## 2022-01-06 DIAGNOSIS — M6281 Muscle weakness (generalized): Secondary | ICD-10-CM | POA: Diagnosis not present

## 2022-01-06 DIAGNOSIS — M25511 Pain in right shoulder: Secondary | ICD-10-CM | POA: Diagnosis not present

## 2022-01-06 DIAGNOSIS — Z9889 Other specified postprocedural states: Secondary | ICD-10-CM | POA: Diagnosis not present

## 2022-01-06 DIAGNOSIS — M25611 Stiffness of right shoulder, not elsewhere classified: Secondary | ICD-10-CM | POA: Diagnosis not present

## 2022-01-09 ENCOUNTER — Ambulatory Visit (INDEPENDENT_AMBULATORY_CARE_PROVIDER_SITE_OTHER): Payer: BC Managed Care – PPO | Admitting: Family Medicine

## 2022-01-09 ENCOUNTER — Encounter: Payer: Self-pay | Admitting: Family Medicine

## 2022-01-09 VITALS — BP 116/72 | HR 64 | Temp 97.5°F | Ht 62.0 in | Wt 113.6 lb

## 2022-01-09 DIAGNOSIS — F5101 Primary insomnia: Secondary | ICD-10-CM

## 2022-01-09 DIAGNOSIS — F419 Anxiety disorder, unspecified: Secondary | ICD-10-CM

## 2022-01-09 DIAGNOSIS — Z114 Encounter for screening for human immunodeficiency virus [HIV]: Secondary | ICD-10-CM | POA: Diagnosis not present

## 2022-01-09 DIAGNOSIS — E1159 Type 2 diabetes mellitus with other circulatory complications: Secondary | ICD-10-CM

## 2022-01-09 DIAGNOSIS — E1165 Type 2 diabetes mellitus with hyperglycemia: Secondary | ICD-10-CM

## 2022-01-09 DIAGNOSIS — F439 Reaction to severe stress, unspecified: Secondary | ICD-10-CM

## 2022-01-09 DIAGNOSIS — F39 Unspecified mood [affective] disorder: Secondary | ICD-10-CM

## 2022-01-09 DIAGNOSIS — I152 Hypertension secondary to endocrine disorders: Secondary | ICD-10-CM

## 2022-01-09 LAB — HEMOGLOBIN A1C: Hgb A1c MFr Bld: 5.4 % (ref 4.6–6.5)

## 2022-01-09 LAB — COMPREHENSIVE METABOLIC PANEL
ALT: 16 U/L (ref 0–35)
AST: 17 U/L (ref 0–37)
Albumin: 4.1 g/dL (ref 3.5–5.2)
Alkaline Phosphatase: 64 U/L (ref 39–117)
BUN: 11 mg/dL (ref 6–23)
CO2: 30 mEq/L (ref 19–32)
Calcium: 9.4 mg/dL (ref 8.4–10.5)
Chloride: 104 mEq/L (ref 96–112)
Creatinine, Ser: 0.76 mg/dL (ref 0.40–1.20)
GFR: 84.16 mL/min (ref >60.00)
Glucose, Bld: 77 mg/dL (ref 70–99)
Potassium: 4.3 mEq/L (ref 3.5–5.1)
Sodium: 141 mEq/L (ref 135–145)
Total Bilirubin: 0.4 mg/dL (ref 0.2–1.2)
Total Protein: 6.8 g/dL (ref 6.0–8.3)

## 2022-01-09 LAB — CBC WITH DIFFERENTIAL/PLATELET
Basophils Absolute: 0 10*3/uL (ref 0.0–0.1)
Basophils Relative: 0.8 % (ref 0.0–3.0)
Eosinophils Absolute: 0.1 10*3/uL (ref 0.0–0.7)
Eosinophils Relative: 1.6 % (ref 0.0–5.0)
HCT: 40.2 % (ref 36.0–46.0)
Hemoglobin: 13.6 g/dL (ref 12.0–15.0)
Lymphocytes Relative: 59.5 % — ABNORMAL HIGH (ref 12.0–46.0)
Lymphs Abs: 2.7 10*3/uL (ref 0.7–4.0)
MCHC: 33.7 g/dL (ref 30.0–36.0)
MCV: 88.1 fl (ref 78.0–100.0)
Monocytes Absolute: 0.3 10*3/uL (ref 0.1–1.0)
Monocytes Relative: 6.5 % (ref 3.0–12.0)
Neutro Abs: 1.5 10*3/uL (ref 1.4–7.7)
Neutrophils Relative %: 31.6 % — ABNORMAL LOW (ref 43.0–77.0)
Platelets: 274 10*3/uL (ref 150.0–400.0)
RBC: 4.57 Mil/uL (ref 3.87–5.11)
RDW: 13.4 % (ref 11.5–15.5)
WBC: 4.6 10*3/uL (ref 4.0–10.5)

## 2022-01-09 LAB — VITAMIN D 25 HYDROXY (VIT D DEFICIENCY, FRACTURES): VITD: 26.12 ng/mL — ABNORMAL LOW (ref 30.00–100.00)

## 2022-01-09 LAB — LIPID PANEL
Cholesterol: 136 mg/dL (ref 0–200)
HDL: 58.5 mg/dL (ref >39.00)
LDL Cholesterol: 53 mg/dL (ref 0–99)
NonHDL: 77.65
Total CHOL/HDL Ratio: 2
Triglycerides: 121 mg/dL (ref 0.0–149.0)
VLDL: 24.2 mg/dL (ref 0.0–40.0)

## 2022-01-09 LAB — MICROALBUMIN / CREATININE URINE RATIO
Creatinine,U: 56.9 mg/dL
Microalb Creat Ratio: 1.2 mg/g (ref 0.0–30.0)
Microalb, Ur: <0.7 mg/dL (ref 0.0–1.9)

## 2022-01-09 LAB — MAGNESIUM: Magnesium: 2 mg/dL (ref 1.5–2.5)

## 2022-01-09 LAB — PHOSPHORUS: Phosphorus: 4.1 mg/dL (ref 2.3–4.6)

## 2022-01-09 LAB — VITAMIN B12: Vitamin B-12: 186 pg/mL — ABNORMAL LOW (ref 211–911)

## 2022-01-09 LAB — TSH: TSH: 2.37 u[IU]/mL (ref 0.35–5.50)

## 2022-01-09 MED ORDER — TIRZEPATIDE 2.5 MG/0.5ML ~~LOC~~ SOAJ: 2.5000 mg | SUBCUTANEOUS | 1 refills | Status: DC

## 2022-01-09 NOTE — Patient Instructions (Addendum)
It was a pleasure meeting you today. Thank you for allowing me to take part in your health care.  Our goals for today as we discussed include:  We will get some labs today.  If they are abnormal or we need to do something about them, I will call you.  If they are normal, I will send you a message on MyChart (if it is active) or a letter in the mail.  If you don't hear from [place] in 2 weeks, please call the office at the number below.   Referral placed for psychiatry  Will have pharmacy team call to verify medications before refilling  Decrease Mounjaro to 2.5 mg weekly   If you have any questions or concerns, please do not hesitate to call the office at [phone].  I look forward to our next visit and until then take care and stay safe.  Regards,   Carollee Leitz, MD   [facility]

## 2022-01-09 NOTE — Progress Notes (Signed)
SUBJECTIVE:   Chief Complaint  Patient presents with   Establish Care    Transfer of Care   HPI Patient presents to clinic to transfer care  Diabetes type 2 Asymptomatic.  Significant weight loss since initiation of Mounjaro.  Currently on 5 mg weekly.  Current weight 113 pounds, BMI now at 20.77.  At risk for malnutrition.  Does not check blood blood glucose at home.  Mood disorder History of anxiety.  Currently on Wellbutrin XL 300 mg daily.  Previously prescribed fluoxetine 20 mg daily however patient is not sure if she is taking this medication.  Patient reports increasing stress with mother who is currently living in [place] with diagnosis of cancer.  Patient has been traveling back and forth from [place] to [place] to help mom and her current condition.  Has been extremely stressful for patient and family.  Has difficulty with sleep and is currently taking Ambien CR 12.5 mg at night.  Migraines Doing well.  Follows with neurology.  Currently takes Zofran, Elavil, Neurontin and Nurtec.  Endorses increasing headaches recently with increasing travel.  Takes Tylenol to help relieve pain.  Denies any weakness, visual changes, slurred speech.  Has follow-up with neurology in near future.  PERTINENT PMH / PSH: Diabetes type 2 Hypercholesterolemia-anemia Mood disorder IBS  OBJECTIVE:  BP 116/72   Pulse 64   Temp (!) 97.5 F (36.4 C)   Ht '5\' 2"'$  (1.575 m)   Wt 113 lb 9.6 oz (51.5 kg)   SpO2 96%   BMI 20.78 kg/m    Physical Exam Vitals reviewed.  Constitutional:      General: She is not in acute distress.    Appearance: Normal appearance. She is not ill-appearing, toxic-appearing or diaphoretic.  HENT:     Head: Normocephalic.     Nose: Nose normal.  Eyes:     Conjunctiva/sclera: Conjunctivae normal.  Cardiovascular:     Rate and Rhythm: Normal rate and regular rhythm.     Heart sounds: Normal heart sounds.  Pulmonary:     Effort: Pulmonary effort is normal.      Breath sounds: Normal breath sounds.  Abdominal:     General: Abdomen is flat. Bowel sounds are normal.     Palpations: Abdomen is soft.  Musculoskeletal:        General: Normal range of motion.     Cervical back: Normal range of motion.  Neurological:     Mental Status: She is alert and oriented to person, place, and time. Mental status is at baseline.  Psychiatric:        Mood and Affect: Mood normal.        Behavior: Behavior normal.        Thought Content: Thought content normal.        Judgment: Judgment normal.     ASSESSMENT/PLAN:  Type 2 diabetes mellitus with hyperglycemia, without long-term current use of insulin (HCC) Assessment & Plan: Chronic.  Well-controlled.  Recent A1c 5.4.  Tolerating Mounjaro 5 mg weekly.  Has lost significant weight since initiation of medication.  BMI now 20 and at risk for underweight. Decrease Mounjaro to 2.5 mg weekly Recommend increasing resistance training to build muscle Repeat A1c, will add Phos and magnesium   Orders: -     Tirzepatide; Inject 2.5 mg into the skin once a week.  Dispense: 2 mL; Refill: 1 -     Hemoglobin A1c -     Comprehensive metabolic panel -  CBC with Differential/Platelet -     Lipid panel -     TSH -     Vitamin B12 -     VITAMIN D 25 Hydroxy (Vit-D Deficiency, Fractures) -     Phosphorus -     Magnesium -     Microalbumin / creatinine urine ratio  Mood disorder ([place]) -     Ambulatory referral to Psychiatry  Hypertension associated with diabetes [facility]) Assessment & Plan: Chronic.  Well-controlled without antihypertensives.  Not currently on ARB/ACE. Continue to monitor blood pressure   Encounter for screening for HIV -     HIV Antibody (routine testing w rflx)  Anxiety Assessment & Plan: Chronic.  Has not been evaluated by psychiatry.  Currently on Wellbutrin 300 mg daily, amitriptyline 25 mg daily.  Was previously prescribed fluoxetine however patient is unsure if she is taking this  medication.  Takes Ambien CR 12.5 mg to help with sleep. Recommend psychiatry referral, patient agreeable. Consider [facility] at next visit.    Primary insomnia Assessment & Plan: Chronic.  Takes Ambien for sleep. Given advancing age recommend decreasing Ambien CR to lowest effective dose.  Patient reports she had previously tried cutting in half was not effective.  Indicates that does not use it every single day. Recommend to continue to encourage to wean over long period of time. Sleep hygiene discussed Recommend CBT Referral to psychiatry   Stress Assessment & Plan: Increased family stress recently with mother dad being diagnosed with cancer and living in [place].  Patient has been traveling back and forth every other week from [place] to [place] which has been increasing stress at home.    I have asked patient to call clinic or MyChart her current medications as she did not have them with her today.  Recommend she bring all medication with her to her next visit for review.   PDMP reviewed  Return in about 2 weeks (around 01/23/2022) for PCP.  Carollee Leitz, MD

## 2022-01-10 LAB — HIV ANTIBODY (ROUTINE TESTING W REFLEX): HIV 1&2 Ab, 4th Generation: NONREACTIVE

## 2022-01-14 ENCOUNTER — Other Ambulatory Visit: Payer: Self-pay | Admitting: Family Medicine

## 2022-01-14 DIAGNOSIS — E559 Vitamin D deficiency, unspecified: Secondary | ICD-10-CM

## 2022-01-14 DIAGNOSIS — R7989 Other specified abnormal findings of blood chemistry: Secondary | ICD-10-CM

## 2022-01-14 DIAGNOSIS — D72820 Lymphocytosis (symptomatic): Secondary | ICD-10-CM

## 2022-01-14 MED ORDER — VITAMIN D (ERGOCALCIFEROL) 1.25 MG (50000 UNIT) PO CAPS: 50000.0000 [IU] | ORAL_CAPSULE | ORAL | 0 refills | Status: DC

## 2022-01-14 MED ORDER — VITAMIN B-12 1000 MCG SL SUBL: 1000.0000 ug | SUBLINGUAL_TABLET | Freq: Every day | SUBLINGUAL | 3 refills | Status: DC

## 2022-01-15 ENCOUNTER — Telehealth: Payer: Self-pay

## 2022-01-15 ENCOUNTER — Other Ambulatory Visit: Payer: Self-pay

## 2022-01-15 DIAGNOSIS — R7989 Other specified abnormal findings of blood chemistry: Secondary | ICD-10-CM

## 2022-01-15 DIAGNOSIS — E559 Vitamin D deficiency, unspecified: Secondary | ICD-10-CM

## 2022-01-15 NOTE — Telephone Encounter (Signed)
LMTCB regarding lab results and to reschedule labs around 01/23/22

## 2022-01-15 NOTE — Telephone Encounter (Signed)
See result note.  

## 2022-01-15 NOTE — Progress Notes (Signed)
noted 

## 2022-01-15 NOTE — Telephone Encounter (Signed)
Patient returned your call.

## 2022-01-24 NOTE — Progress Notes (Incomplete)
   SUBJECTIVE:   Chief Complaint  Patient presents with  . Establish Care    Transfer of Care   HPI Patient presents to clinic to transfer care  Diabetes type 2 Asymptomatic.  Significant weight loss since initiation of Mounjaro.  Currently on 5 mg weekly.  Current weight 113 pounds, BMI now at 20.77.  At risk for malnutrition.  Does not check blood blood glucose at home.  Mood disorder History of anxiety.  Currently on Wellbutrin XL 300 mg daily.  Previously prescribed fluoxetine 20 mg daily however patient is not sure if she is taking this medication.  Patient reports increasing stress with mother who is currently living in [place] with diagnosis of cancer.  Patient has been traveling back and forth from [place] to [place] to help mom and her current condition.  Has been extremely stressful for patient and family.  Has difficulty with sleep and is currently taking Ambien CR 12.5 mg at night.  Migraines Doing well.  Follows with neurology.  Currently takes Zofran, Elavil, Neurontin and Nurtec.  Endorses increasing headaches recently with increasing travel.  Takes Tylenol to help relieve pain.  Denies any weakness, visual changes, slurred speech.  Has follow-up with neurology in near future.  PERTINENT PMH / PSH: Diabetes type 2 Hypercholesterolemia-anemia Mood disorder IBS  OBJECTIVE:  BP 116/72   Pulse 64   Temp (!) 97.5 F (36.4 C)   Ht '5\' 2"'$  (1.575 m)   Wt 113 lb 9.6 oz (51.5 kg)   SpO2 96%   BMI 20.78 kg/m    Physical Exam Vitals reviewed.  Constitutional:      General: She is not in acute distress.    Appearance: Normal appearance. She is not ill-appearing, toxic-appearing or diaphoretic.  HENT:     Head: Normocephalic.     Nose: Nose normal.  Eyes:     Conjunctiva/sclera: Conjunctivae normal.  Cardiovascular:     Rate and Rhythm: Normal rate and regular rhythm.     Heart sounds: Normal heart sounds.  Pulmonary:     Effort: Pulmonary effort is normal.      Breath sounds: Normal breath sounds.  Abdominal:     General: Abdomen is flat. Bowel sounds are normal.     Palpations: Abdomen is soft.  Musculoskeletal:        General: Normal range of motion.     Cervical back: Normal range of motion.  Neurological:     Mental Status: She is alert and oriented to person, place, and time. Mental status is at baseline.  Psychiatric:        Mood and Affect: Mood normal.        Behavior: Behavior normal.        Thought Content: Thought content normal.        Judgment: Judgment normal.     ASSESSMENT/PLAN:  There are no diagnoses linked to this encounter. PDMP reviewed  No follow-ups on file.  Carollee Leitz, MD

## 2022-01-25 ENCOUNTER — Encounter: Payer: Self-pay | Admitting: Family Medicine

## 2022-01-25 NOTE — Assessment & Plan Note (Signed)
Increased family stress recently with mother dad being diagnosed with cancer and living in [place].  Patient has been traveling back and forth every other week from [place] to [place] which has been increasing stress at home.

## 2022-01-25 NOTE — Assessment & Plan Note (Signed)
Chronic.  Takes Ambien for sleep. Given advancing age recommend decreasing Ambien CR to lowest effective dose.  Patient reports she had previously tried cutting in half was not effective.  Indicates that does not use it every single day. Recommend to continue to encourage to wean over long period of time. Sleep hygiene discussed Recommend CBT Referral to psychiatry

## 2022-01-25 NOTE — Assessment & Plan Note (Signed)
Chronic.  Has not been evaluated by psychiatry.  Currently on Wellbutrin 300 mg daily, amitriptyline 25 mg daily.  Was previously prescribed fluoxetine however patient is unsure if she is taking this medication.  Takes Ambien CR 12.5 mg to help with sleep. Recommend psychiatry referral, patient agreeable. Consider [facility] at next visit.

## 2022-01-25 NOTE — Assessment & Plan Note (Signed)
Chronic.  Well-controlled without antihypertensives.  Not currently on ARB/ACE. Continue to monitor blood pressure

## 2022-01-25 NOTE — Assessment & Plan Note (Signed)
Chronic.  Well-controlled.  Recent A1c 5.4.  Tolerating Mounjaro 5 mg weekly.  Has lost significant weight since initiation of medication.  BMI now 20 and at risk for underweight. Decrease Mounjaro to 2.5 mg weekly Recommend increasing resistance training to build muscle Repeat A1c, will add Phos and magnesium

## 2022-01-27 ENCOUNTER — Telehealth (INDEPENDENT_AMBULATORY_CARE_PROVIDER_SITE_OTHER): Payer: BC Managed Care – PPO | Admitting: Family Medicine

## 2022-01-27 ENCOUNTER — Encounter: Payer: Self-pay | Admitting: Family Medicine

## 2022-01-27 VITALS — Wt 118.0 lb

## 2022-01-27 DIAGNOSIS — F39 Unspecified mood [affective] disorder: Secondary | ICD-10-CM

## 2022-01-27 DIAGNOSIS — E559 Vitamin D deficiency, unspecified: Secondary | ICD-10-CM | POA: Diagnosis not present

## 2022-01-27 DIAGNOSIS — E1165 Type 2 diabetes mellitus with hyperglycemia: Secondary | ICD-10-CM | POA: Diagnosis not present

## 2022-01-27 DIAGNOSIS — F32A Depression, unspecified: Secondary | ICD-10-CM | POA: Insufficient documentation

## 2022-01-27 MED ORDER — BUSPIRONE HCL 5 MG PO TABS: 5.0000 mg | ORAL_TABLET | Freq: Two times a day (BID) | ORAL | 0 refills | Status: DC

## 2022-01-27 MED ORDER — CITALOPRAM HYDROBROMIDE 10 MG PO TABS: 10.0000 mg | ORAL_TABLET | Freq: Every day | ORAL | 0 refills | Status: DC

## 2022-01-27 MED ORDER — CALCIUM CITRATE-VITAMIN D 500-10 MG-MCG PO CHEW: CHEWABLE_TABLET | ORAL | Status: DC

## 2022-01-27 NOTE — Progress Notes (Signed)
Virtual Visit via Video note  I connected with Kristin [name] on 01/27/22 at 1148 by video and verified that I am speaking with the correct person using two identifiers. Kristin [name] is currently located at home and is currently alone during visit. The provider, Carollee Leitz, MD is located in their office at time of visit.  I discussed the limitations, risks, security and privacy concerns of performing an evaluation and management service by video and the availability of in person appointments. I also discussed with the patient that there may be a patient responsible charge related to this service. The patient expressed understanding and agreed to proceed.  Subjective: PCP: Carollee Leitz, MD  Chief Complaint  Patient presents with   Diabetes    aniety    Diabetes  Doing well.  Asymptomatic.  Increase in weight by 5 pounds since last visit.  Has not received Mounjaro 2.5 mg from pharmacy yet.  Mood disorder Continues to have increasing anxiety.  Recently restarted amitriptyline 25 mg at night.  Does not seem to be effective.  Also takes Atarax 25 mg at night that does not help relieve anxiety.  Has not been taking Wellbutrin or fluoxetine.  Previously evaluated by psychology for ADHD.  Has not been evaluated by psychiatry.  Endorses worsening anxiety with travel back and forth to help mother in different state.  No SI/HI.  Continues to have difficulty sleeping, currently taking Ambien CR 12.5 mg at night.  ROS: Per HPI  Current Outpatient Medications:    acetaminophen (TYLENOL) 500 MG tablet, Take 2 tablets (1,000 mg total) by mouth every 8 (eight) hours., Disp: 90 tablet, Rfl: 2   busPIRone (BUSPAR) 5 MG tablet, Take 1 tablet (5 mg total) by mouth 2 (two) times daily., Disp: 60 tablet, Rfl: 0   Calcium Citrate-Vitamin D 500-10 MG-MCG CHEW, Take 1 tablet two times a day, Disp: , Rfl:    citalopram (CELEXA) 10 MG tablet, Take 1 tablet (10 mg total) by mouth daily., Disp: 30  tablet, Rfl: 0   Cyanocobalamin (VITAMIN B-12) 1000 MCG SUBL, Place 1 tablet (1,000 mcg total) under the tongue daily., Disp: 90 tablet, Rfl: 3   dicyclomine (BENTYL) 10 MG capsule, Take 10 mg by mouth 4 (four) times daily -  before meals and at bedtime., Disp: , Rfl:    fexofenadine (ALLEGRA) 180 MG tablet, TAKE 1 TABLET (180 MG TOTAL) BY MOUTH DAILY AS NEEDED FOR ALLERGIES OR RHINITIS., Disp: 90 tablet, Rfl: 3   gabapentin (NEURONTIN) 300 MG capsule, Take 1 capsule (300 mg total) by mouth 3 (three) times daily as needed., Disp: 90 capsule, Rfl: 11   glucose blood (FREESTYLE LITE) test strip, Use as instructed daily. E11.59, E16.2. dispense one touch verio reflect strips, Disp: 100 each, Rfl: 12   hydrOXYzine (ATARAX) 10 MG tablet, 25 mg., Disp: , Rfl:    montelukast (SINGULAIR) 10 MG tablet, Take 1 tablet (10 mg total) by mouth at bedtime., Disp: 90 tablet, Rfl: 3   Multiple Vitamin (MULTIVITAMIN) tablet, Take 1 tablet by mouth daily., Disp: , Rfl:    omeprazole (PRILOSEC) 40 MG capsule, TAKE 1 CAPSULE (40 MG TOTAL) BY MOUTH DAILY BEFORE SUPPER., Disp: 90 capsule, Rfl: 3   ondansetron (ZOFRAN-ODT) 4 MG disintegrating tablet, Take 1 tablet (4 mg total) by mouth every 8 (eight) hours as needed. for nausea, Disp: 30 tablet, Rfl: 5   Rimegepant Sulfate (NURTEC) 75 MG TBDP, TAKE 75 MG BY MOUTH DAILY AS NEEDED (TAKE FOR ABORTIVE  THERAPY OF MIGRAINE, NO MORE THAN 1 TABLET IN 24 HOURS OR 10 PER MONTH)., Disp: 10 tablet, Rfl: 2   rosuvastatin (CRESTOR) 20 MG tablet, TAKE 1 TABLET BY MOUTH EVERY DAY, Disp: 90 tablet, Rfl: 2   tirzepatide (MOUNJARO) 2.5 MG/0.5ML Pen, Inject 2.5 mg into the skin once a week., Disp: 2 mL, Rfl: 1   Vitamin D, Ergocalciferol, (DRISDOL) 1.25 MG (50000 UNIT) CAPS capsule, Take 1 capsule (50,000 Units total) by mouth every 7 (seven) days., Disp: 12 capsule, Rfl: 0   zolpidem (AMBIEN CR) 12.5 MG CR tablet, Take 1 tablet (12.5 mg total) by mouth at bedtime as needed., Disp: 30 tablet,  Rfl: 5  Observations/Objective: Physical Exam Pulmonary:     Effort: Pulmonary effort is normal.  Neurological:     Mental Status: She is alert and oriented to person, place, and time. Mental status is at baseline.  Psychiatric:        Mood and Affect: Mood normal.        Behavior: Behavior normal.        Thought Content: Thought content normal.        Judgment: Judgment normal.    Assessment and Plan: Mood disorder ([place]) Assessment & Plan: Chronic.  PHQ-9 score 21, GAD score 19.  Denies any SI/HI. Stop amitriptyline Start Celexa 10 mg daily Start BuSpar 5 mg twice daily, can increase to 10 mg twice daily if needed Continue Atarax 25 mg at night Referral previously sent to psychiatry Mental health resources provided Recommend CBT Recommend [facility] testing at next visit. Follow-up in 1 week.  Orders: -     Citalopram Hydrobromide; Take 1 tablet (10 mg total) by mouth daily.  Dispense: 30 tablet; Refill: 0 -     busPIRone HCl; Take 1 tablet (5 mg total) by mouth 2 (two) times daily.  Dispense: 60 tablet; Refill: 0  Type 2 diabetes mellitus with hyperglycemia, without long-term current use of insulin (HCC) Assessment & Plan: Chronic.  Reports weight increased 5 pounds since last visit. Continue Mounjaro 2.5 mg weekly    Vitamin D deficiency -     Calcium Citrate-Vitamin D; Take 1 tablet two times a day    Follow Up Instructions: Return in about 1 week (around 02/03/2022) for PCP, mood.   I discussed the assessment and treatment plan with the patient. The patient was provided an opportunity to ask questions and all were answered. The patient agreed with the plan and demonstrated an understanding of the instructions.   The patient was advised to call back or seek an in-person evaluation if the symptoms worsen or if the condition fails to improve as anticipated.  The above assessment and management plan was discussed with the patient. The patient verbalized  understanding of and has agreed to the management plan. Patient is aware to call the clinic if symptoms persist or worsen. Patient is aware when to return to the clinic for a follow-up visit. Patient educated on when it is appropriate to go to the emergency department.    Carollee Leitz, MD

## 2022-01-27 NOTE — Patient Instructions (Addendum)
It was a pleasure meeting you today. Thank you for allowing me to take part in your health care.  Our goals for today as we discussed include:  Start Celebrex 10 mg daily Start Buspar 5 mg two a day, can increase to 10 mg two times after 1-2 days Stop Amitriptyline  Referral has been sent to Psychiatry. Please call [phone] to check on appointment. If you have received a call from their office in 1-2 weeks please call our office so we can check on the referral.  Recommend therapy check [email or web address]  For [place] [phone] Address: [place], [place] [address] Hours: Open 24/7, No appointment required.    Call pharmacy for the prescription for the [facility].  The prescription was sent 12/21.  If you have any questions or concerns, please do not hesitate to call the office at [phone].  I look forward to our next visit and until then take care and stay safe.  Regards,   Carollee Leitz, MD   [facility]

## 2022-01-27 NOTE — Assessment & Plan Note (Signed)
Chronic.  PHQ-9 score 21, GAD score 19.  Denies any SI/HI. Stop amitriptyline Start Celexa 10 mg daily Start BuSpar 5 mg twice daily, can increase to 10 mg twice daily if needed Continue Atarax 25 mg at night Referral previously sent to psychiatry Mental health resources provided Recommend CBT Recommend [facility] testing at next visit. Follow-up in 1 week.

## 2022-01-27 NOTE — Assessment & Plan Note (Signed)
Chronic.  Reports weight increased 5 pounds since last visit. Continue Mounjaro 2.5 mg weekly

## 2022-01-30 DIAGNOSIS — Z9889 Other specified postprocedural states: Secondary | ICD-10-CM | POA: Diagnosis not present

## 2022-01-30 DIAGNOSIS — M6281 Muscle weakness (generalized): Secondary | ICD-10-CM | POA: Diagnosis not present

## 2022-01-30 DIAGNOSIS — M25611 Stiffness of right shoulder, not elsewhere classified: Secondary | ICD-10-CM | POA: Diagnosis not present

## 2022-01-30 DIAGNOSIS — M25511 Pain in right shoulder: Secondary | ICD-10-CM | POA: Diagnosis not present

## 2022-02-03 ENCOUNTER — Ambulatory Visit: Payer: BC Managed Care – PPO | Admitting: Family Medicine

## 2022-02-03 DIAGNOSIS — Z9889 Other specified postprocedural states: Secondary | ICD-10-CM | POA: Diagnosis not present

## 2022-02-03 DIAGNOSIS — M6281 Muscle weakness (generalized): Secondary | ICD-10-CM | POA: Diagnosis not present

## 2022-02-03 DIAGNOSIS — M25511 Pain in right shoulder: Secondary | ICD-10-CM | POA: Diagnosis not present

## 2022-02-03 DIAGNOSIS — M25611 Stiffness of right shoulder, not elsewhere classified: Secondary | ICD-10-CM | POA: Diagnosis not present

## 2022-02-05 ENCOUNTER — Other Ambulatory Visit (INDEPENDENT_AMBULATORY_CARE_PROVIDER_SITE_OTHER): Payer: BC Managed Care – PPO

## 2022-02-05 DIAGNOSIS — R7989 Other specified abnormal findings of blood chemistry: Secondary | ICD-10-CM

## 2022-02-05 DIAGNOSIS — E559 Vitamin D deficiency, unspecified: Secondary | ICD-10-CM

## 2022-02-05 DIAGNOSIS — D72820 Lymphocytosis (symptomatic): Secondary | ICD-10-CM

## 2022-02-05 LAB — CBC WITH DIFFERENTIAL/PLATELET
Basophils Absolute: 0.1 10*3/uL (ref 0.0–0.1)
Basophils Relative: 1.2 % (ref 0.0–3.0)
Eosinophils Absolute: 0.1 10*3/uL (ref 0.0–0.7)
Eosinophils Relative: 2.1 % (ref 0.0–5.0)
HCT: 38.5 % (ref 36.0–46.0)
Hemoglobin: 13.1 g/dL (ref 12.0–15.0)
Lymphocytes Relative: 39.6 % (ref 12.0–46.0)
Lymphs Abs: 2.5 10*3/uL (ref 0.7–4.0)
MCHC: 34.1 g/dL (ref 30.0–36.0)
MCV: 87.8 fl (ref 78.0–100.0)
Monocytes Absolute: 0.4 10*3/uL (ref 0.1–1.0)
Monocytes Relative: 6.4 % (ref 3.0–12.0)
Neutro Abs: 3.2 10*3/uL (ref 1.4–7.7)
Neutrophils Relative %: 50.7 % (ref 43.0–77.0)
Platelets: 239 10*3/uL (ref 150.0–400.0)
RBC: 4.38 Mil/uL (ref 3.87–5.11)
RDW: 13.8 % (ref 11.5–15.5)
WBC: 6.4 10*3/uL (ref 4.0–10.5)

## 2022-02-05 LAB — VITAMIN B12: Vitamin B-12: 919 pg/mL — ABNORMAL HIGH (ref 211–911)

## 2022-02-05 LAB — VITAMIN D 25 HYDROXY (VIT D DEFICIENCY, FRACTURES): VITD: 33.35 ng/mL (ref 30.00–100.00)

## 2022-02-06 ENCOUNTER — Other Ambulatory Visit: Payer: Self-pay | Admitting: Neurology

## 2022-02-06 ENCOUNTER — Ambulatory Visit: Payer: BC Managed Care – PPO | Admitting: Family Medicine

## 2022-02-10 ENCOUNTER — Ambulatory Visit: Payer: BC Managed Care – PPO | Admitting: Family Medicine

## 2022-02-10 DIAGNOSIS — M75121 Complete rotator cuff tear or rupture of right shoulder, not specified as traumatic: Secondary | ICD-10-CM | POA: Diagnosis not present

## 2022-02-10 LAB — PATHOLOGIST SMEAR REVIEW
Basophils Absolute: 0.1 10*3/uL (ref 0.0–0.2)
Basos: 1 %
EOS (ABSOLUTE): 0.2 10*3/uL (ref 0.0–0.4)
Eos: 2 %
Hematocrit: 41 % (ref 34.0–46.6)
Hemoglobin: 13 g/dL (ref 11.1–15.9)
Immature Grans (Abs): 0 10*3/uL (ref 0.0–0.1)
Immature Granulocytes: 0 %
Lymphocytes Absolute: 2.6 10*3/uL (ref 0.7–3.1)
Lymphs: 40 %
MCH: 28.9 pg (ref 26.6–33.0)
MCHC: 31.7 g/dL (ref 31.5–35.7)
MCV: 91 fL (ref 79–97)
Monocytes Absolute: 0.4 10*3/uL (ref 0.1–0.9)
Monocytes: 6 %
Neutrophils Absolute: 3.2 10*3/uL (ref 1.4–7.0)
Neutrophils: 51 %
Platelets: 245 10*3/uL (ref 150–450)
RBC: 4.5 x10E6/uL (ref 3.77–5.28)
RDW: 13 % (ref 11.7–15.4)
WBC: 6.4 10*3/uL (ref 3.4–10.8)

## 2022-02-10 LAB — SPECIMEN STATUS REPORT

## 2022-02-10 LAB — SMEAR REVIEW PER PROTOCOL

## 2022-02-11 ENCOUNTER — Other Ambulatory Visit: Payer: Self-pay

## 2022-02-11 DIAGNOSIS — M67921 Unspecified disorder of synovium and tendon, right upper arm: Secondary | ICD-10-CM | POA: Diagnosis not present

## 2022-02-11 DIAGNOSIS — G8929 Other chronic pain: Secondary | ICD-10-CM | POA: Diagnosis not present

## 2022-02-13 ENCOUNTER — Ambulatory Visit: Payer: BC Managed Care – PPO | Admitting: Family Medicine

## 2022-02-13 ENCOUNTER — Encounter: Payer: Self-pay | Admitting: Family Medicine

## 2022-02-13 VITALS — BP 120/78 | HR 86 | Temp 97.9°F | Ht 63.0 in | Wt 119.4 lb

## 2022-02-13 DIAGNOSIS — M533 Sacrococcygeal disorders, not elsewhere classified: Secondary | ICD-10-CM

## 2022-02-13 DIAGNOSIS — F39 Unspecified mood [affective] disorder: Secondary | ICD-10-CM

## 2022-02-13 DIAGNOSIS — L600 Ingrowing nail: Secondary | ICD-10-CM

## 2022-02-13 DIAGNOSIS — E1165 Type 2 diabetes mellitus with hyperglycemia: Secondary | ICD-10-CM

## 2022-02-13 MED ORDER — CITALOPRAM HYDROBROMIDE 20 MG PO TABS: 20.0000 mg | ORAL_TABLET | Freq: Every day | ORAL | 3 refills | Status: DC

## 2022-02-13 NOTE — Patient Instructions (Signed)
It was a pleasure meeting you today. Thank you for allowing me to take part in your health care.  Our goals for today as we discussed include:  Increase Celexa to 20 mg daily Continue BuSpar 5 mg 2 times daily, can increase to 10 mg 3 times a day.  Recommend [email or web address] to schedule therapist appointment for counseling to help with anxiety management  Likely pain in her coccyx is from frequent long distance driving Use diclofenac gel 4 times daily to affected area. Use a wedge cushion to relieve pressure on your coccyx area Take Tylenol 500 mg 3 times daily to help relieve pain    For your ingrown toenail Does not look infected.  Recommend scheduling appointment with podiatry for evaluation of toenail removal.  Follow-up in 2 weeks.  If you have any questions or concerns, please do not hesitate to call the office at [phone].  I look forward to our next visit and until then take care and stay safe.  Regards,   Carollee Leitz, MD   [facility]

## 2022-02-13 NOTE — Progress Notes (Signed)
SUBJECTIVE:   Chief Complaint  Patient presents with   Medical Management of Chronic Issues    Follow up   HPI Presents for follow up for mood disorder. Seen by virtual visit on 01/08 and treated with citalopram 10 mg daily and BuSpar 5 mg twice daily.  Amitriptyline was discontinued.  Referral sent to psychiatry and recommended therapy at that time.  Since then patient reports some improvement in symptoms.  Feels like Celexa has started to take the edge off her anxiety.  Continues to use BuSpar 5 mg 3-4 times a week as needed.  Reports anxiety worsens when driving but since starting chronic medication feels like this has to slightly improved.  She would like to increase her Celexa dose today and continue with BuSpar.  Coccyx pain Patient reports excruciating coccyx pain when sitting.  She has been frequently driving from [place] to [place] to help out with her elderly mother who recently was diagnosed with cancer.  Has not tried any conservative measures for pain management.  Reports using her mother's donut cushion which did help somewhat.  Denies any fevers, numbness/tingling of lower extremities, incontinence of bowel or bladder.  No recent history of falls or trauma.  No recent weight loss.  Weight has increased over the past month.  Left great toe. Reports ingrown toenail.  Symptoms started a month ago.  Increasing pain.  Had been erythematous without drainage.  Has been soaking frequently in warm water.  Frequently goes to have pedicures.  Has been wearing loose fitting shoes for comfort.  PERTINENT PMH / PSH: Type 2 diabetes Hypertension Mood disorder    OBJECTIVE:  BP 120/78   Pulse 86   Temp 97.9 F (36.6 C) (Oral)   Ht '5\' 3"'$  (1.6 m)   Wt 119 lb 6.4 oz (54.2 kg)   SpO2 99%   BMI 21.15 kg/m    Physical Exam Vitals reviewed.  Constitutional:      General: She is not in acute distress.    Appearance: Normal appearance. She is normal weight. She is not  ill-appearing, toxic-appearing or diaphoretic.  Eyes:     General:        Right eye: No discharge.        Left eye: No discharge.     Conjunctiva/sclera: Conjunctivae normal.  Cardiovascular:     Rate and Rhythm: Normal rate and regular rhythm.     Heart sounds: Normal heart sounds.  Pulmonary:     Effort: Pulmonary effort is normal.     Breath sounds: Normal breath sounds.  Abdominal:     General: Bowel sounds are normal.  Musculoskeletal:        General: Normal range of motion.     Right foot: Normal range of motion. No deformity or bunion.     Left foot: Normal range of motion. No deformity or bunion.  Feet:     Right foot:     Skin integrity: Skin integrity normal. No erythema or warmth.     Toenail Condition: Right toenails are ingrown.     Left foot:     Skin integrity: Skin integrity normal. No erythema or warmth.     Toenail Condition: Left toenails are normal.  Skin:    General: Skin is warm and dry.  Neurological:     General: No focal deficit present.     Mental Status: She is alert and oriented to person, place, and time. Mental status is at baseline.  Psychiatric:  Attention and Perception: Attention normal.        Mood and Affect: Affect normal. Mood is anxious.        Speech: Speech normal.        Behavior: Behavior normal. Behavior is cooperative.        Thought Content: Thought content normal. Thought content does not include homicidal or suicidal ideation. Thought content does not include homicidal or suicidal plan.        Cognition and Memory: Cognition and memory normal.        Judgment: Judgment normal.       02/13/2022   11:03 AM 01/27/2022   11:38 AM 01/09/2022   11:17 AM 10/02/2021    9:52 AM 06/19/2021    3:09 PM  Depression screen PHQ 2/9  Decreased Interest '2 2 2 '$ 0 0  Down, Depressed, Hopeless '2 3 2 '$ 0 0  PHQ - 2 Score '4 5 4 '$ 0 0  Altered sleeping '3 3 3    '$ Tired, decreased energy '2 3 2    '$ Change in appetite 0 2 3    Feeling bad or  failure about yourself  '1 2 1    '$ Trouble concentrating '2 3 3    '$ Moving slowly or fidgety/restless '2 3 2    '$ Suicidal thoughts 0 0 0    PHQ-9 Score '14 21 18    '$ Difficult doing work/chores Very difficult Very difficult Very difficult         02/13/2022   11:03 AM 01/27/2022   11:38 AM 01/09/2022   11:18 AM 04/23/2021    1:08 PM  GAD 7 : Generalized Anxiety Score  Nervous, Anxious, on Edge '3 3 3 '$ 0  Control/stop worrying '3 3 3 '$ 0  Worry too much - different things '3 3 3 '$ 0  Trouble relaxing '2 2 2 '$ 0  Restless 2 3 0 0  Easily annoyed or irritable '1 2 3 '$ 0  Afraid - awful might happen '3 3 3 '$ 0  Total GAD 7 Score '17 19 17 '$ 0  Anxiety Difficulty Very difficult Very difficult Very difficult Not difficult at all      ASSESSMENT/PLAN:  Mood disorder ([place]) Assessment & Plan: Chronic.  PHQ-9 AND GAD score improved slightly since last visit.  Denies any SI/HI.  Tolerating citalopram and BuSpar Increase Celexa 20 mg daily Continue BuSpar 5 mg twice daily, can increase to 10 mg twice daily if needed Continue Atarax 25 mg at night Upcoming appointment with psychiatry in February Mental health resources provided Encourage CBT Follow-up in 1- 2 weeks  Orders: -     Citalopram Hydrobromide; Take 1 tablet (20 mg total) by mouth daily.  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  No signs of infection.  Tender to touch. Continue warm saline soaks Previously seen podiatry for ankle surgery. Recommend to follow-up with podiatry.  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  Tenderness to coccyx area, no erythema, edema, induration or rash present.  Low suspicion for pilonidal sinus.  Doubt fracture given no history of falls or trauma. Recommend  wedge cushion to relieve pressure especially with long distance Use diclofenac gel 4 times daily to affected area. Use a wedge cushion to relieve pressure on your coccyx area Take Tylenol 500 mg 3 times daily  to help relieve pain Avoid long-term use of NSAIDs given history of hypertension. Offered physical therapy, patient politely declined If symptoms continue to worsen can follow-up with orthopedics.  Patient has already established patient.    PDMP reviewed  Return in about 2 weeks (around 02/27/2022).  Carollee Leitz, MD

## 2022-02-16 ENCOUNTER — Encounter: Payer: Self-pay | Admitting: Family Medicine

## 2022-02-16 DIAGNOSIS — L600 Ingrowing nail: Secondary | ICD-10-CM | POA: Insufficient documentation

## 2022-02-16 DIAGNOSIS — M533 Sacrococcygeal disorders, not elsewhere classified: Secondary | ICD-10-CM | POA: Insufficient documentation

## 2022-02-16 NOTE — Assessment & Plan Note (Signed)
Chronic.  No signs of infection.  Tender to touch. Continue warm saline soaks Previously seen podiatry for ankle surgery. Recommend to follow-up with podiatry.  If needing new referral patient can MyChart me and we will happily place new referral Strict return precautions provided

## 2022-02-16 NOTE — Assessment & Plan Note (Signed)
Likely secondary from increased pressure from secondary to long distance driving.  Tenderness to coccyx area, no erythema, edema, induration or rash present.  Low suspicion for pilonidal sinus.  Doubt fracture given no history of falls or trauma. Recommend wedge cushion to relieve pressure especially with long distance Use diclofenac gel 4 times daily to affected area. Use a wedge cushion to relieve pressure on your coccyx area Take Tylenol 500 mg 3 times daily to help relieve pain Avoid long-term use of NSAIDs given history of hypertension. Offered physical therapy, patient politely declined If symptoms continue to worsen can follow-up with orthopedics.  Patient has already established patient.

## 2022-02-16 NOTE — Assessment & Plan Note (Signed)
Chronic.  PHQ-9 AND GAD score improved slightly since last visit.  Denies any SI/HI.  Tolerating citalopram and BuSpar Increase Celexa 20 mg daily Continue BuSpar 5 mg twice daily, can increase to 10 mg twice daily if needed Continue Atarax 25 mg at night Upcoming appointment with psychiatry in February Mental health resources provided Encourage CBT Follow-up in 1- 2 weeks

## 2022-02-20 DIAGNOSIS — M533 Sacrococcygeal disorders, not elsewhere classified: Secondary | ICD-10-CM | POA: Diagnosis not present

## 2022-02-20 DIAGNOSIS — G8929 Other chronic pain: Secondary | ICD-10-CM | POA: Diagnosis not present

## 2022-02-20 DIAGNOSIS — M5440 Lumbago with sciatica, unspecified side: Secondary | ICD-10-CM | POA: Diagnosis not present

## 2022-02-28 ENCOUNTER — Other Ambulatory Visit: Payer: Self-pay

## 2022-02-28 DIAGNOSIS — J309 Allergic rhinitis, unspecified: Secondary | ICD-10-CM

## 2022-02-28 MED ORDER — FEXOFENADINE HCL 180 MG PO TABS: 180.0000 mg | ORAL_TABLET | Freq: Every day | ORAL | 3 refills | Status: DC | PRN

## 2022-03-02 NOTE — Progress Notes (Signed)
SUBJECTIVE:   Chief Complaint  Patient presents with   Medical Management of Chronic Issues    Mood Disorder   HPI Presents for follow up for mood disorder. Seen in clinic on 01/25 and Celexa increased to 20 mg daily.  Since then patient reports not much improvement in symptoms however had just started to increase the medication 1 week ago.  Has used the Buspar 10 mg daily.  Continues to use hydroxyzine 25 mg at night  Coccydynia Recently seen by Ortho.  X-rays lumbar spine and pelvis with sacral views did not show any fracture.  Prominent coccyx.  Recently started meloxicam 15 mg for shoulder pain.  PERTINENT PMH / PSH: Mood disorder  OBJECTIVE:  BP 116/80   Pulse 62   Temp 97.9 F (36.6 C)   Ht 5' 3"$  (1.6 m)   Wt 117 lb 3.2 oz (53.2 kg)   SpO2 99%   BMI 20.76 kg/m    Physical Exam Vitals reviewed.  Constitutional:      General: She is not in acute distress.    Appearance: Normal appearance. She is normal weight. She is not ill-appearing, toxic-appearing or diaphoretic.  Eyes:     General:        Right eye: No discharge.        Left eye: No discharge.     Conjunctiva/sclera: Conjunctivae normal.  Cardiovascular:     Rate and Rhythm: Normal rate.  Pulmonary:     Effort: Pulmonary effort is normal.  Musculoskeletal:        General: Normal range of motion.  Skin:    General: Skin is warm and dry.  Neurological:     General: No focal deficit present.     Mental Status: She is alert and oriented to person, place, and time. Mental status is at baseline.  Psychiatric:        Mood and Affect: Mood normal.        Behavior: Behavior normal.        Thought Content: Thought content normal.        Judgment: Judgment normal.       03/03/2022    2:33 PM 02/13/2022   11:03 AM 01/27/2022   11:38 AM 01/09/2022   11:17 AM 10/02/2021    9:52 AM  Depression screen PHQ 2/9  Decreased Interest 2 2 2 2 $ 0  Down, Depressed, Hopeless 2 2 3 2 $ 0  PHQ - 2 Score 4 4 5 4 $ 0  Altered  sleeping 3 3 3 3   $ Tired, decreased energy 3 2 3 2   $ Change in appetite 0 0 2 3   Feeling bad or failure about yourself  2 1 2 1   $ Trouble concentrating 2 2 3 3   $ Moving slowly or fidgety/restless 1 2 3 2   $ Suicidal thoughts 0 0 0 0   PHQ-9 Score 15 14 21 18   $ Difficult doing work/chores Very difficult Very difficult Very difficult Very difficult        03/03/2022    2:33 PM 02/13/2022   11:03 AM 01/27/2022   11:38 AM 01/09/2022   11:18 AM  GAD 7 : Generalized Anxiety Score  Nervous, Anxious, on Edge 3 3 3 3  $ Control/stop worrying 3 3 3 3  $ Worry too much - different things 3 3 3 3  $ Trouble relaxing 2 2 2 2  $ Restless 1 2 3 $ 0  Easily annoyed or irritable 1 1 2 3  $ Afraid - awful  might happen 3 3 3 3  $ Total GAD 7 Score 16 17 19 17  $ Anxiety Difficulty Very difficult Very difficult Very difficult Very difficult      ASSESSMENT/PLAN:  Mood disorder ([place]) Assessment & Plan: Chronic.  No significant change in PHQ-9 or GAD score since last visit.  Denies any SI/HI.  Tolerating citalopram and BuSpar Continue Celexa 20 mg daily Increase BuSpar 10 mg twice daily Continue Atarax 25 mg at night Upcoming appointment with psychiatry, Dr. Lalla Brothers in March Mental health resources provided Encourage CBT Follow-up in 1- 2 weeks  Orders: -     busPIRone HCl; Take 1 tablet (10 mg total) by mouth 2 (two) times daily as needed.  Dispense: 60 tablet; Refill: 1  Nontraumatic coccydynia Assessment & Plan: Improving.  Continuing meloxicam Follow-up with orthopedics as scheduled.     PDMP reviewed  Return in about 4 months (around 07/02/2022) for PCP.  Carollee Leitz, MD

## 2022-03-02 NOTE — Patient Instructions (Incomplete)
It was a pleasure meeting you today. Thank you for allowing me to take part in your health care.  Our goals for today as we discussed include:  Will order ultrasound to check for stones Take ibuprofen 600 mg every 8 hours for pain as needed  Will get some labs today and notify your results.  4 weeks If you have any questions or concerns, please do not hesitate to call the office at [phone].  I look forward to our next visit and until then take care and stay safe.  Regards,   Dalilah Curlin, MD   [facility] [place]  

## 2022-03-03 ENCOUNTER — Encounter: Payer: Self-pay | Admitting: Family Medicine

## 2022-03-03 ENCOUNTER — Ambulatory Visit: Payer: BC Managed Care – PPO | Admitting: Family Medicine

## 2022-03-03 VITALS — BP 116/80 | HR 62 | Temp 97.9°F | Ht 63.0 in | Wt 117.2 lb

## 2022-03-03 DIAGNOSIS — F39 Unspecified mood [affective] disorder: Secondary | ICD-10-CM | POA: Diagnosis not present

## 2022-03-03 DIAGNOSIS — M533 Sacrococcygeal disorders, not elsewhere classified: Secondary | ICD-10-CM

## 2022-03-03 MED ORDER — BUSPIRONE HCL 10 MG PO TABS: 10.0000 mg | ORAL_TABLET | Freq: Two times a day (BID) | ORAL | 1 refills | Status: DC | PRN

## 2022-03-04 NOTE — Patient Instructions (Incomplete)
Below is our plan:  We will continue gabapentin 300mg  twice daily and ondansetron and Nurtec as needed. We will restart a lower dose of topiramate to see if this helps with daily headaches.   Please make sure you are staying well hydrated. I recommend 50-60 ounces daily. Well balanced diet and regular exercise encouraged. Consistent sleep schedule with 6-8 hours recommended.   Please continue follow up with care team as directed.   Follow up with me in 6 months   You may receive a survey regarding today's visit. I encourage you to leave honest feed back as I do use this information to improve patient care. Thank you for seeing me today!

## 2022-03-04 NOTE — Progress Notes (Unsigned)
PATIENT: Kristin [name] DOB: Feb 04, 1960  REASON FOR VISIT: follow up HISTORY FROM: patient  No chief complaint on file.    HISTORY OF PRESENT ILLNESS:  03/04/22 ALL: Kristin [name] returns for follow up for migraines and diabetic neuropathy. She continues topiramate 70m BID??? and Nurtec PRN. Gabapentin 3067mQHS?ALA?   02/07/2021 ALL: CaTye [place]urns for follow up for migraines and diabetic polyneuropathy. She continues topiramate 5063mID and Nurtec as needed. Also on amitriptyline 72m77m bedtime for sleep managed by psychiatry.   She feels that she is doing ok. Headaches are usually well managed. She did have a migraine that lasted about 2 days around Christmas and she contributes to stress. She thinks it was about 2-3 months since she had to use abortive meds.   Dr AherJaynee Eaglesrted gabapentin 300mg3m and advised she consider alpha lipoic acid as well as lidocaine and capsaicin OTC. She has only taken gabapentin at night due to sleepiness. She did not try OTC meds. She does continue to have numbness/tingling of both hands and feet. She feels that right hand may be a little weaker than left. Has trouble holding glass or coffee cups. Does well with plastic cups. She also reports new numbness of posterior right thigh. Worse when sitting. She has noticed for a couple of weeks. No lower extremity weakness. She does have intermittent low back pain.   She has lost 20 pounds since 07/2020, 50lbs over the past year. A1C now 6.2. She is taking Mounjaro and tolerating well.    08/07/2020 AA: She has very poor balance, she feels like she may keep going forward when stands, when she walks down the grocery isle she feels like she has to hold onto her husband. Pins and needles, cramping in the feet, she can be sitting, walking, in bed it is more hte cramps int he back of the legs and the arch of the foot, K+ pills helped with the cramps. Slowly progressive. Ongoing for over a year.    HgbA1c 6/22 7.4   04/05/20 8.3, 03/2020 B12 383, 03/2020 tsh 2.49   Interval history 11/10/2017: MR of the brain showed frontal chronic sinusitis, discussed seeing ENT. Propranolol is helping, the headaches are not as bad. She has been tested in the past recently 3 years ago and no sleep apnea.  She is under more stress now. Her pulse is 60 cannot increase propranolol further. Discussed options, due to stress may start Prozac which is a migraine preventative and can help with stress. Zofran for dizziness or nausea. Also start Emgality.    Until this past week has been doing "great" with the migraines but this one has on ongoing going for days will give you a torado Toradol l injection at that . 1Medications tried: propranolol, topiramate, prozac, nortriptyline, verapamil   REVIEW OF SYSTEMS: Out of a complete 14 system review of symptoms, the patient complains only of the following symptoms, headaches, anxiety, low back pain, numbness/tingling, insomnia and all other reviewed systems are negative.   ALLERGIES: Allergies  Allergen Reactions   Tetracyclines & Related Swelling and Rash    FACE SWELL, RASH ON CHEST   Amoxicillin    Alcohol Rash    ABDOMINAL CRAMPS, drinking alcohol   Azithromycin Rash   Metoprolol Diarrhea    HOME MEDICATIONS: Outpatient Medications Prior to Visit  Medication Sig Dispense Refill   acetaminophen (TYLENOL) 500 MG tablet Take 2 tablets (1,000 mg total) by mouth every 8 (eight) hours. 90 tablet 2  busPIRone (BUSPAR) 10 MG tablet Take 1 tablet (10 mg total) by mouth 2 (two) times daily as needed. 60 tablet 1   Calcium Citrate-Vitamin D 500-10 MG-MCG CHEW Take 1 tablet two times a day     citalopram (CELEXA) 20 MG tablet Take 1 tablet (20 mg total) by mouth daily. 30 tablet 3   Cyanocobalamin (VITAMIN B-12) 1000 MCG SUBL Place 1 tablet (1,000 mcg total) under the tongue daily. 90 tablet 3   dicyclomine (BENTYL) 10 MG capsule Take 10 mg by mouth 4 (four) times daily -  before meals  and at bedtime.     fexofenadine (ALLEGRA) 180 MG tablet Take 1 tablet (180 mg total) by mouth daily as needed for allergies or rhinitis. 90 tablet 3   gabapentin (NEURONTIN) 300 MG capsule TAKE 1 CAPSULE BY MOUTH 3 TIMES DAILY AS NEEDED. 90 capsule 8   glucose blood (FREESTYLE LITE) test strip Use as instructed daily. E11.59, E16.2. dispense one touch verio reflect strips 100 each 12   hydrOXYzine (ATARAX) 10 MG tablet 25 mg.     meloxicam (MOBIC) 15 MG tablet Take 15 mg by mouth daily.     montelukast (SINGULAIR) 10 MG tablet Take 1 tablet (10 mg total) by mouth at bedtime. 90 tablet 3   Multiple Vitamin (MULTIVITAMIN) tablet Take 1 tablet by mouth daily.     omeprazole (PRILOSEC) 40 MG capsule TAKE 1 CAPSULE (40 MG TOTAL) BY MOUTH DAILY BEFORE SUPPER. 90 capsule 3   ondansetron (ZOFRAN-ODT) 4 MG disintegrating tablet Take 1 tablet (4 mg total) by mouth every 8 (eight) hours as needed. for nausea 30 tablet 5   Rimegepant Sulfate (NURTEC) 75 MG TBDP TAKE 75 MG BY MOUTH DAILY AS NEEDED (TAKE FOR ABORTIVE THERAPY OF MIGRAINE, NO MORE THAN 1 TABLET IN 24 HOURS OR 10 PER MONTH). 10 tablet 2   rosuvastatin (CRESTOR) 20 MG tablet TAKE 1 TABLET BY MOUTH EVERY DAY 90 tablet 2   tirzepatide (MOUNJARO) 2.5 MG/0.5ML Pen Inject 2.5 mg into the skin once a week. 2 mL 1   Vitamin D, Ergocalciferol, (DRISDOL) 1.25 MG (50000 UNIT) CAPS capsule Take 1 capsule (50,000 Units total) by mouth every 7 (seven) days. 12 capsule 0   zolpidem (AMBIEN CR) 12.5 MG CR tablet Take 1 tablet (12.5 mg total) by mouth at bedtime as needed. 30 tablet 5   No facility-administered medications prior to visit.    PAST MEDICAL HISTORY: Past Medical History:  Diagnosis Date   ADHD    Adhesive capsulitis of right shoulder 07/23/2020   Allergy    Annual physical exam 08/19/2019   Anxiety 12/12/2015   Arthritis    Basal cell carcinoma    nose, left cheek, right upper lip, right posterior shoulder most recent 6 years ago on left  cheek   Basal cell carcinoma 03/26/2021   L chest, EDC 04/24/2021   Benign essential hypertension 08/14/2014   Bleeding    Bursitis of right shoulder 12/25/2020   Chicken pox    Chronic diarrhea    Chronic intractable headache 10/20/2017   Chronic migraine w/o aura, not intractable, w/o stat migr [id number]   Complication of anesthesia    blood pressure elevated after gastric sleeve surgery in recovery, pt does not want mask over face; migraines   COVID-19    03/01/20 had MAB 03/03/20 and felt better next day 03/04/20   CTS (carpal tunnel syndrome) 04/05/2020   Dr. Manuella Ghazi s/p [facility]/Lidocaine injection 12/05/19   Depression  Diabetes mellitus without complication ([place])    pre-diabetic currently, was diabetic prior to gastric sleeve surgery    Diabetic polyneuropathy associated with diabetes mellitus due to underlying condition ([place]) 08/08/2020   Disorder of right rotator cuff    10/03/19 MRI right shoulder abnormal Dr. Maurine Cane   Fall    08/2020   Fall    03/28/21   Fatty liver    Fatty liver 05/06/2017   [place] + 2013    Gout    Hematuria 10/15/2018   History of shingles 06/19/2021   lower back and R side of thigh   Hives 03/28/2020   Hyperlipidemia    Hypertension    no problems since losing weight after gastric sleeve surgery   Hypertension 07/17/2014   IBS (irritable bowel syndrome)    Injury of right wrist 06/26/2016   Insomnia    Migraine    Neuropathy    bilateral feet   Obesity    Obesity (BMI 30-39.9) 05/06/2017   Obesity, diabetes, and hypertension syndrome ([place]) 08/18/2019   Osteopenia    Palpitations 08/14/2014   PONV (postoperative nausea and vomiting)    Squamous cell carcinoma of skin 03/26/2021   SCC IS R mid sternum, EDC 04/24/2021   Tendonitis of shoulder, right 12/25/2020    PAST SURGICAL HISTORY: Past Surgical History:  Procedure Laterality Date   ABDOMINAL HYSTERECTOMY     no h/o abnormal pap still has 1 ovary left   ANKLE ARTHROSCOPY Left 05/29/2016    Procedure: ANKLE ARTHROSCOPY;  Surgeon: [name], DPM;  Location: [facility];  Service: Podiatry;  Laterality: Left;   APPENDECTOMY     basal cell removed     [place] PROPOFOL N/A 01/31/2019   Procedure: COLONOSCOPY WITH PROPOFOL;  Surgeon: Lin Landsman, MD;  Location: [facility] ENDOSCOPY;  Service: Gastroenterology;  Laterality: N/A;   cyst removed from wrist     ESOPHAGOGASTRODUODENOSCOPY (EGD) WITH PROPOFOL N/A 01/31/2019   Procedure: ESOPHAGOGASTRODUODENOSCOPY (EGD) WITH PROPOFOL;  Surgeon: Lin Landsman, MD;  Location: [place];  Service: Gastroenterology;  Laterality: N/A;   FLEXIBLE SIGMOIDOSCOPY N/A 05/21/2020   Procedure: FLEXIBLE SIGMOIDOSCOPY;  Surgeon: Lin Landsman, MD;  Location: [facility] ENDOSCOPY;  Service: Gastroenterology;  Laterality: N/A;   FOOT SURGERY     left foot 11/2016 Dr. Laymond Purser    left elbow surgery     07/2017 Dr Leanor Kail epicondylitis    LYSIS OF ADHESION Right 08/27/2021   Procedure: Right shoulder arthroscopic lysis of adhesions, capsular release, and manipulation under anesthesia with corticosteroid injection and biceps tenodesis;  Surgeon: Leim Fabry, MD;  Location: [place];  Service: Orthopedics;  Laterality: Right;   MOHS SURGERY     x 2 face nose and left cheek    OSTEOCHONDROMA EXCISION Left 05/29/2016   Procedure: OSTEOCHONDRAL DRILLING TALUS;  Surgeon: [name], DPM;  Location: [place];  Service: Podiatry;  Laterality: Left;   PLANTAR FASCIA RELEASE Left 05/29/2016   Procedure: ENDOSCOPIC PLANTAR FASCIOTOMY;  Surgeon: [name], DPM;  Location: [place];  Service: Podiatry;  Laterality: Left;   ROTATOR CUFF REPAIR Left    STOMACH SURGERY  2017   gastric sleeve surgery   TENNIS ELBOW RELEASE/NIRSCHEL PROCEDURE Left 08/05/2017   Procedure: TENNIS ELBOW RELEASE/NIRSCHEL PROCEDURE;  Surgeon: Leanor Kail, MD;  Location: [facility];   Service: Orthopedics;  Laterality: Left;   TUBAL LIGATION  wedge resection of ovary  1982    FAMILY HISTORY: Family History  Problem Relation Age of Onset   Heart disease Mother    Breast cancer Mother 61       metastatic, now hip, spine, and leg    Arthritis Mother    Hyperlipidemia Mother    Cancer Mother        spinal surgery. breast dx'ed age 60 now metastatic 20 as of 07/13/18   Cystic fibrosis Mother        carrier    Heart disease Father    Lung cancer Father        smoker   Hyperlipidemia Father    Hypertension Father    Diabetes Sister    Stroke Sister    Other Sister        covid +   Diabetes Sister    Arthritis Maternal Grandmother    Diabetes Maternal Grandfather    Hyperlipidemia Maternal Grandfather    Diabetes Maternal Aunt    Diabetes Maternal Aunt    Diabetes Maternal Uncle    Sudden death Cousin    Cystic fibrosis Other        great niece    Factor V Leiden deficiency Other        uncle and cousins and m GF (all maternal)   Colon cancer Neg Hx    Ovarian cancer Neg Hx     SOCIAL HISTORY: Social History   Socioeconomic History   Marital status: Married    Spouse name: Not on file   Number of children: 3   Years of education: Not on file   Highest education level: Some college, no degree  Occupational History   Not on file  Tobacco Use   Smoking status: Never   Smokeless tobacco: Never  Vaping Use   Vaping Use: Never used  Substance and Sexual Activity   Alcohol use: Never   Drug use: Never   Sexual activity: Yes    Partners: Male    Birth control/protection: Surgical  Other Topics Concern   Not on file  Social History Narrative   Married x 37 years as of 10/2017    Kids 3 boys youngest is Eddie Dibbles   Write childrens books    Retired from [employer] 01/2017    Enjoys traveling    Right handed   Caffeine: quit 2009   Social Determinants of Health   Financial Resource Strain: Low Risk  (02/06/2021)   Overall Financial Resource Strain  (CARDIA)    Difficulty of Paying Living Expenses: Not hard at all  Food Insecurity: Not on file  Transportation Needs: Not on file  Physical Activity: Inactive (02/10/2017)   Exercise Vital Sign    Days of Exercise per Week: 0 days    Minutes of Exercise per Session: 0 min  Stress: Not on file  Social Connections: Not on file  Intimate Partner Violence: Not on file      PHYSICAL EXAM  There were no vitals filed for this visit.   There is no height or weight on file to calculate BMI.  Generalized: Well developed, in no acute distress  Cardiology: normal rate and rhythm, no murmur noted Respiratory: Clear to auscultation bilaterally  Neurological examination  Mentation: Alert oriented to time, place, history taking. Follows all commands speech and language fluent Cranial nerve II-XII: Pupils were equal round reactive to light. Extraocular movements were full, visual field were full on confrontational test. Facial sensation and strength were normal. Head  turning and shoulder shrug  were normal and symmetric. Motor: The motor testing reveals 5 over 5 strength of all 4 extremities. Grip strength is normal. Good symmetric motor tone is noted throughout.  Sensory: Sensory testing is intact to soft touch, pinprick and vibration on all 4 extremities with exception of decreased vibratory sensation of left lateral malleolus (prior ankle surgery) No evidence of extinction is noted.  Coordination: Cerebellar testing reveals good finger-nose-finger and heel-to-shin bilaterally.  Gait and station: Gait is normal.   DIAGNOSTIC DATA (LABS, IMAGING, TESTING) - I reviewed patient records, labs, notes, testing and imaging myself where available.      No data to display           Lab Results  Component Value Date   WBC 6.4 02/05/2022   HGB 13.0 02/05/2022   HCT 41.0 02/05/2022   MCV 91 02/05/2022   PLT 245 02/05/2022      Component Value Date/Time   NA 141 01/09/2022 1203   NA 141  05/05/2019 0838   NA 139 10/24/2012 1218   K 4.3 01/09/2022 1203   K 3.9 10/24/2012 1218   CL 104 01/09/2022 1203   CL 103 10/24/2012 1218   CO2 30 01/09/2022 1203   CO2 29 10/24/2012 1218   GLUCOSE 77 01/09/2022 1203   GLUCOSE 103 (H) 10/24/2012 1218   BUN 11 01/09/2022 1203   BUN 11 05/05/2019 0838   BUN 12 10/24/2012 1218   CREATININE 0.76 01/09/2022 1203   CREATININE 0.67 10/24/2012 1218   CALCIUM 9.4 01/09/2022 1203   CALCIUM 9.0 10/24/2012 1218   PROT 6.8 01/09/2022 1203   PROT 7.4 10/15/2020 1356   PROT 7.4 10/24/2012 1218   ALBUMIN 4.1 01/09/2022 1203   ALBUMIN 4.7 10/15/2020 1356   ALBUMIN 3.2 (L) 10/24/2012 1218   AST 17 01/09/2022 1203   AST 14 (L) 10/24/2012 1218   ALT 16 01/09/2022 1203   ALT 22 10/24/2012 1218   ALKPHOS 64 01/09/2022 1203   ALKPHOS 99 10/24/2012 1218   BILITOT 0.4 01/09/2022 1203   BILITOT 0.3 10/15/2020 1356   BILITOT 0.2 10/24/2012 1218   GFRNONAA 96 05/05/2019 0838   GFRNONAA >60 10/24/2012 1218   GFRAA 110 05/05/2019 0838   GFRAA >60 10/24/2012 1218   Lab Results  Component Value Date   CHOL 136 01/09/2022   HDL 58.50 01/09/2022   LDLCALC 53 01/09/2022   LDLDIRECT 48.0 11/08/2020   TRIG 121.0 01/09/2022   CHOLHDL 2 01/09/2022   Lab Results  Component Value Date   HGBA1C 5.4 01/09/2022   Lab Results  Component Value Date   VITAMINB12 919 (H) 02/05/2022   Lab Results  Component Value Date   TSH 2.37 01/09/2022       ASSESSMENT AND PLAN 62 y.o. year old female  has a past medical history of ADHD, Adhesive capsulitis of right shoulder (07/23/2020), Allergy, Annual physical exam (08/19/2019), Anxiety (12/12/2015), Arthritis, Basal cell carcinoma, Basal cell carcinoma (03/26/2021), Benign essential hypertension (08/14/2014), Bleeding, Bursitis of right shoulder (12/25/2020), Chicken pox, Chronic diarrhea, Chronic intractable headache (10/20/2017), Chronic migraine w/o aura, not intractable, w/o stat migr (Q000111Q),  Complication of anesthesia, COVID-19, CTS (carpal tunnel syndrome) (04/05/2020), Depression, Diabetes mellitus without complication ([place]), Diabetic polyneuropathy associated with diabetes mellitus due to underlying condition ([place]) (08/08/2020), Disorder of right rotator cuff, Fall, Fall, Fatty liver, Fatty liver (05/06/2017), Gout, Hematuria (10/15/2018), History of shingles (06/19/2021), Hives (03/28/2020), Hyperlipidemia, Hypertension, Hypertension (07/17/2014), IBS (irritable bowel syndrome), Injury of right wrist (06/26/2016),  Insomnia, Migraine, Neuropathy, Obesity, Obesity (BMI 30-39.9) (05/06/2017), Obesity, diabetes, and hypertension syndrome ([place]) (08/18/2019), Osteopenia, Palpitations (08/14/2014), PONV (postoperative nausea and vomiting), Squamous cell carcinoma of skin (03/26/2021), and Tendonitis of shoulder, right (12/25/2020). here with   No diagnosis found.    Kristin [name] reports that headaches are well managed. We will continue topiramate 89m BID for prevention and Nurtec as needed for abortive therapy. She will continue gabapentin 3071mup to three times daily for DM neuropathy. Alpha lipoic acid, lidocaine and capsaicin could also provide relief if needed. I have asked her to monitor numbness of posterior thigh and follow up with PCP for any worsening. My consider NCS/EMG if needed but would not change current treatment plan at this time. Consider PT if not improving. She will work on adequate hydration, well balanced diet and regular exercise. She will return for follow up in 1 year. She verbalizes understanding and agreement with this plan.    No orders of the defined types were placed in this encounter.    No orders of the defined types were placed in this encounter.     AmDebbora PrestoFNP-C 03/04/2022, 4:16 PM [facility] [address] [phone]

## 2022-03-05 ENCOUNTER — Other Ambulatory Visit: Payer: Self-pay | Admitting: Family Medicine

## 2022-03-05 ENCOUNTER — Encounter: Payer: Self-pay | Admitting: Family Medicine

## 2022-03-05 ENCOUNTER — Ambulatory Visit: Payer: BC Managed Care – PPO | Admitting: Family Medicine

## 2022-03-05 VITALS — BP 132/65 | HR 64 | Ht 63.0 in | Wt 117.4 lb

## 2022-03-05 DIAGNOSIS — E0842 Diabetes mellitus due to underlying condition with diabetic polyneuropathy: Secondary | ICD-10-CM

## 2022-03-05 DIAGNOSIS — E1165 Type 2 diabetes mellitus with hyperglycemia: Secondary | ICD-10-CM

## 2022-03-05 DIAGNOSIS — G43709 Chronic migraine without aura, not intractable, without status migrainosus: Secondary | ICD-10-CM

## 2022-03-05 MED ORDER — TOPIRAMATE 50 MG PO TABS: 50.0000 mg | ORAL_TABLET | Freq: Every day | ORAL | 3 refills | Status: DC

## 2022-03-05 MED ORDER — GABAPENTIN 300 MG PO CAPS: ORAL_CAPSULE | ORAL | 5 refills | Status: DC

## 2022-03-05 MED ORDER — NURTEC 75 MG PO TBDP: 75.0000 mg | ORAL_TABLET | Freq: Every day | ORAL | 2 refills | Status: DC | PRN

## 2022-03-05 MED ORDER — ONDANSETRON 4 MG PO TBDP: 4.0000 mg | ORAL_TABLET | Freq: Three times a day (TID) | ORAL | 2 refills | Status: AC | PRN

## 2022-03-07 ENCOUNTER — Encounter: Payer: Self-pay | Admitting: Family Medicine

## 2022-03-07 NOTE — Assessment & Plan Note (Addendum)
Chronic.  No significant change in PHQ-9 or GAD score since last visit.  Denies any SI/HI.  Tolerating citalopram and BuSpar Continue Celexa 20 mg daily Increase BuSpar 10 mg twice daily Continue Atarax 25 mg at night Upcoming appointment with psychiatry, Dr. Lalla Brothers in March Mental health resources provided Encourage CBT Follow-up in 1- 2 weeks

## 2022-03-07 NOTE — Assessment & Plan Note (Signed)
Improving.  Continuing meloxicam Follow-up with orthopedics as scheduled.

## 2022-03-13 ENCOUNTER — Other Ambulatory Visit: Payer: Self-pay

## 2022-03-13 MED FILL — Tirzepatide Soln Auto-injector 2.5 MG/0.5ML: SUBCUTANEOUS | 28 days supply | Qty: 2 | Fill #0 | Status: AC

## 2022-03-17 ENCOUNTER — Other Ambulatory Visit: Payer: Self-pay

## 2022-03-26 DIAGNOSIS — M67921 Unspecified disorder of synovium and tendon, right upper arm: Secondary | ICD-10-CM | POA: Diagnosis not present

## 2022-03-26 DIAGNOSIS — Z9889 Other specified postprocedural states: Secondary | ICD-10-CM | POA: Diagnosis not present

## 2022-03-27 ENCOUNTER — Other Ambulatory Visit: Payer: Self-pay | Admitting: Orthopedic Surgery

## 2022-03-27 DIAGNOSIS — M7501 Adhesive capsulitis of right shoulder: Secondary | ICD-10-CM | POA: Diagnosis not present

## 2022-03-27 DIAGNOSIS — M67921 Unspecified disorder of synovium and tendon, right upper arm: Secondary | ICD-10-CM | POA: Diagnosis not present

## 2022-03-31 ENCOUNTER — Encounter
Admission: RE | Disposition: A | Discharge status: Home / Self Care | Payer: Self-pay | Source: Home / Self Care | Attending: Orthopedic Surgery

## 2022-03-31 ENCOUNTER — Ambulatory Visit: Payer: BC Managed Care – PPO | Admitting: Certified Registered"

## 2022-03-31 ENCOUNTER — Encounter: Payer: Self-pay | Admitting: Orthopedic Surgery

## 2022-03-31 ENCOUNTER — Ambulatory Visit: Payer: BC Managed Care – PPO | Admitting: Dermatology

## 2022-03-31 ENCOUNTER — Other Ambulatory Visit: Payer: Self-pay

## 2022-03-31 ENCOUNTER — Ambulatory Visit: Payer: BC Managed Care – PPO

## 2022-03-31 ENCOUNTER — Ambulatory Visit
Admission: RE | Admit: 2022-03-31 | Discharge: 2022-03-31 | Disposition: A | Discharge status: Home / Self Care | Payer: BC Managed Care – PPO | Attending: Orthopedic Surgery | Admitting: Orthopedic Surgery

## 2022-03-31 DIAGNOSIS — Z833 Family history of diabetes mellitus: Secondary | ICD-10-CM | POA: Diagnosis not present

## 2022-03-31 DIAGNOSIS — M7551 Bursitis of right shoulder: Secondary | ICD-10-CM | POA: Insufficient documentation

## 2022-03-31 DIAGNOSIS — Z9049 Acquired absence of other specified parts of digestive tract: Secondary | ICD-10-CM | POA: Diagnosis not present

## 2022-03-31 DIAGNOSIS — I10 Essential (primary) hypertension: Secondary | ICD-10-CM | POA: Insufficient documentation

## 2022-03-31 DIAGNOSIS — R7303 Prediabetes: Secondary | ICD-10-CM | POA: Diagnosis not present

## 2022-03-31 DIAGNOSIS — M75121 Complete rotator cuff tear or rupture of right shoulder, not specified as traumatic: Secondary | ICD-10-CM | POA: Diagnosis not present

## 2022-03-31 DIAGNOSIS — M7581 Other shoulder lesions, right shoulder: Secondary | ICD-10-CM | POA: Diagnosis not present

## 2022-03-31 DIAGNOSIS — Z9889 Other specified postprocedural states: Secondary | ICD-10-CM | POA: Diagnosis not present

## 2022-03-31 DIAGNOSIS — M7501 Adhesive capsulitis of right shoulder: Secondary | ICD-10-CM | POA: Insufficient documentation

## 2022-03-31 DIAGNOSIS — M659 Synovitis and tenosynovitis, unspecified: Secondary | ICD-10-CM | POA: Insufficient documentation

## 2022-03-31 DIAGNOSIS — Z90710 Acquired absence of both cervix and uterus: Secondary | ICD-10-CM | POA: Diagnosis not present

## 2022-03-31 DIAGNOSIS — M67921 Unspecified disorder of synovium and tendon, right upper arm: Secondary | ICD-10-CM | POA: Diagnosis not present

## 2022-03-31 DIAGNOSIS — Z9884 Bariatric surgery status: Secondary | ICD-10-CM | POA: Diagnosis not present

## 2022-03-31 DIAGNOSIS — Z419 Encounter for procedure for purposes other than remedying health state, unspecified: Secondary | ICD-10-CM

## 2022-03-31 DIAGNOSIS — K219 Gastro-esophageal reflux disease without esophagitis: Secondary | ICD-10-CM | POA: Insufficient documentation

## 2022-03-31 DIAGNOSIS — G8918 Other acute postprocedural pain: Secondary | ICD-10-CM | POA: Diagnosis not present

## 2022-03-31 DIAGNOSIS — E785 Hyperlipidemia, unspecified: Secondary | ICD-10-CM | POA: Diagnosis not present

## 2022-03-31 DIAGNOSIS — G4730 Sleep apnea, unspecified: Secondary | ICD-10-CM | POA: Insufficient documentation

## 2022-03-31 DIAGNOSIS — M19011 Primary osteoarthritis, right shoulder: Secondary | ICD-10-CM | POA: Diagnosis not present

## 2022-03-31 DIAGNOSIS — M75102 Unspecified rotator cuff tear or rupture of left shoulder, not specified as traumatic: Secondary | ICD-10-CM | POA: Diagnosis not present

## 2022-03-31 HISTORY — PX: SHOULDER ARTHROSCOPY WITH BICEPSTENOTOMY: SHX6204

## 2022-03-31 HISTORY — PX: CLOSED MANIPULATION SHOULDER WITH STERIOD INJECTION: SHX5611

## 2022-03-31 HISTORY — PX: SHOULDER ARTHROSCOPY WITH ROTATOR CUFF REPAIR: SHX5685

## 2022-03-31 LAB — GLUCOSE, CAPILLARY
Glucose-Capillary: 75 mg/dL (ref 70–99)
Glucose-Capillary: 121 mg/dL — ABNORMAL HIGH (ref 70–99)

## 2022-03-31 SURGERY — SHOULDER ARTHROSCOPY WITH BICEPS TENOTOMY
Anesthesia: General | Site: Shoulder | Laterality: Right

## 2022-03-31 MED ORDER — PROPOFOL 10 MG/ML IV BOLUS
INTRAVENOUS | Status: AC
  Filled 2022-03-31: qty 20

## 2022-03-31 MED ORDER — BUPIVACAINE HCL (PF) 0.5 % IJ SOLN
INTRAMUSCULAR | Status: AC
  Filled 2022-03-31: qty 30

## 2022-03-31 MED ORDER — CHLORHEXIDINE GLUCONATE 0.12 % MT SOLN: 15.0000 mL | Freq: Once | OROMUCOSAL | Status: AC

## 2022-03-31 MED ORDER — MIDAZOLAM HCL 2 MG/2ML IJ SOLN
INTRAMUSCULAR | Status: AC
  Filled 2022-03-31: qty 2

## 2022-03-31 MED ORDER — ACETAMINOPHEN 10 MG/ML IV SOLN
INTRAVENOUS | Status: AC
  Filled 2022-03-31: qty 100

## 2022-03-31 MED ORDER — LIDOCAINE HCL (PF) 1 % IJ SOLN
INTRAMUSCULAR | Status: AC
  Filled 2022-03-31: qty 30

## 2022-03-31 MED ORDER — ASPIRIN 325 MG PO TBEC: 325.0000 mg | DELAYED_RELEASE_TABLET | Freq: Every day | ORAL | 0 refills | Status: AC

## 2022-03-31 MED ORDER — OXYCODONE HCL 5 MG/5ML PO SOLN: 5.0000 mg | Freq: Once | ORAL | Status: DC | PRN

## 2022-03-31 MED ORDER — ONDANSETRON HCL 4 MG/2ML IJ SOLN
INTRAMUSCULAR | Status: DC | PRN
  Administered 2022-03-31: 4 mg via INTRAVENOUS

## 2022-03-31 MED ORDER — PHENYLEPHRINE HCL-NACL 20-0.9 MG/250ML-% IV SOLN
INTRAVENOUS | Status: DC | PRN
  Administered 2022-03-31: 20 ug/min via INTRAVENOUS

## 2022-03-31 MED ORDER — ACETAMINOPHEN 10 MG/ML IV SOLN
INTRAVENOUS | Status: DC | PRN
  Administered 2022-03-31: 1000 mg via INTRAVENOUS

## 2022-03-31 MED ORDER — OXYCODONE HCL 5 MG PO TABS: 5.0000 mg | ORAL_TABLET | ORAL | 0 refills | Status: DC | PRN

## 2022-03-31 MED ORDER — RINGERS IRRIGATION IR SOLN
Status: DC | PRN
  Administered 2022-03-31: 12000 mL

## 2022-03-31 MED ORDER — TRIAMCINOLONE ACETONIDE 40 MG/ML IJ SUSP
INTRAMUSCULAR | Status: AC
  Filled 2022-03-31: qty 1

## 2022-03-31 MED ORDER — SUGAMMADEX SODIUM 200 MG/2ML IV SOLN
INTRAVENOUS | Status: DC | PRN
  Administered 2022-03-31: 200 mg via INTRAVENOUS

## 2022-03-31 MED ORDER — CEFAZOLIN SODIUM-DEXTROSE 2-4 GM/100ML-% IV SOLN
INTRAVENOUS | Status: AC
  Filled 2022-03-31: qty 100

## 2022-03-31 MED ORDER — PHENYLEPHRINE HCL (PRESSORS) 10 MG/ML IV SOLN
INTRAVENOUS | Status: DC | PRN
  Administered 2022-03-31: 40 ug via INTRAVENOUS
  Administered 2022-03-31 (×2): 80 ug via INTRAVENOUS

## 2022-03-31 MED ORDER — ORAL CARE MOUTH RINSE: 15.0000 mL | Freq: Once | OROMUCOSAL | Status: AC

## 2022-03-31 MED ORDER — FENTANYL CITRATE (PF) 100 MCG/2ML IJ SOLN
INTRAMUSCULAR | Status: AC
  Filled 2022-03-31: qty 2

## 2022-03-31 MED ORDER — LIDOCAINE HCL (PF) 1 % IJ SOLN
INTRAMUSCULAR | Status: AC
  Filled 2022-03-31: qty 5

## 2022-03-31 MED ORDER — OXYCODONE HCL 5 MG PO TABS: 5.0000 mg | ORAL_TABLET | Freq: Once | ORAL | Status: DC | PRN

## 2022-03-31 MED ORDER — TRIAMCINOLONE ACETONIDE 40 MG/ML IJ SUSP
INTRAMUSCULAR | Status: DC | PRN
  Administered 2022-03-31: 10 mL via INTRAMUSCULAR

## 2022-03-31 MED ORDER — ONDANSETRON 4 MG PO TBDP: 4.0000 mg | ORAL_TABLET | Freq: Three times a day (TID) | ORAL | 0 refills | Status: DC | PRN

## 2022-03-31 MED ORDER — CHLORHEXIDINE GLUCONATE 0.12 % MT SOLN
OROMUCOSAL | Status: AC
  Administered 2022-03-31: 15 mL via OROMUCOSAL
  Filled 2022-03-31: qty 15

## 2022-03-31 MED ORDER — PROPOFOL 10 MG/ML IV BOLUS
INTRAVENOUS | Status: DC | PRN
  Administered 2022-03-31: 150 mg via INTRAVENOUS

## 2022-03-31 MED ORDER — FENTANYL CITRATE (PF) 100 MCG/2ML IJ SOLN
INTRAMUSCULAR | Status: DC | PRN
  Administered 2022-03-31: 25 ug via INTRAVENOUS

## 2022-03-31 MED ORDER — BUPIVACAINE HCL (PF) 0.5 % IJ SOLN
INTRAMUSCULAR | Status: DC | PRN
  Administered 2022-03-31: 10 mL

## 2022-03-31 MED ORDER — MIDAZOLAM HCL 2 MG/2ML IJ SOLN: 1.0000 mg | Freq: Once | INTRAMUSCULAR | Status: AC

## 2022-03-31 MED ORDER — LACTATED RINGERS IV SOLN
INTRAVENOUS | Status: DC | PRN
  Administered 2022-03-31: 4 mL

## 2022-03-31 MED ORDER — ACETAMINOPHEN 500 MG PO TABS: 1000.0000 mg | ORAL_TABLET | Freq: Three times a day (TID) | ORAL | 2 refills | Status: AC

## 2022-03-31 MED ORDER — ROCURONIUM BROMIDE 100 MG/10ML IV SOLN
INTRAVENOUS | Status: DC | PRN
  Administered 2022-03-31: 50 mg via INTRAVENOUS

## 2022-03-31 MED ORDER — FENTANYL CITRATE (PF) 100 MCG/2ML IJ SOLN: 25.0000 ug | INTRAMUSCULAR | Status: DC | PRN

## 2022-03-31 MED ORDER — EPHEDRINE SULFATE (PRESSORS) 50 MG/ML IJ SOLN
INTRAMUSCULAR | Status: DC | PRN
  Administered 2022-03-31 (×2): 10 mg via INTRAVENOUS

## 2022-03-31 MED ORDER — FENTANYL CITRATE PF 50 MCG/ML IJ SOSY
PREFILLED_SYRINGE | INTRAMUSCULAR | Status: AC
  Administered 2022-03-31: 50 ug via INTRAVENOUS
  Filled 2022-03-31: qty 1

## 2022-03-31 MED ORDER — FENTANYL CITRATE PF 50 MCG/ML IJ SOSY: 50.0000 ug | PREFILLED_SYRINGE | Freq: Once | INTRAMUSCULAR | Status: AC

## 2022-03-31 MED ORDER — BUPIVACAINE LIPOSOME 1.3 % IJ SUSP
INTRAMUSCULAR | Status: AC
  Filled 2022-03-31: qty 20

## 2022-03-31 MED ORDER — MIDAZOLAM HCL 2 MG/2ML IJ SOLN
INTRAMUSCULAR | Status: AC
  Administered 2022-03-31: 1 mg via INTRAVENOUS
  Filled 2022-03-31: qty 2

## 2022-03-31 MED ORDER — BUPIVACAINE HCL (PF) 0.5 % IJ SOLN
INTRAMUSCULAR | Status: AC
  Filled 2022-03-31: qty 10

## 2022-03-31 MED ORDER — BUPIVACAINE LIPOSOME 1.3 % IJ SUSP
INTRAMUSCULAR | Status: DC | PRN
  Administered 2022-03-31: 20 mL

## 2022-03-31 MED ORDER — CEFAZOLIN SODIUM-DEXTROSE 2-4 GM/100ML-% IV SOLN
2.0000 g | INTRAVENOUS | Status: AC
  Administered 2022-03-31: 2 g via INTRAVENOUS

## 2022-03-31 MED ORDER — DEXAMETHASONE SODIUM PHOSPHATE 10 MG/ML IJ SOLN
INTRAMUSCULAR | Status: DC | PRN
  Administered 2022-03-31: 10 mg via INTRAVENOUS

## 2022-03-31 MED ORDER — SODIUM CHLORIDE 0.9 % IV SOLN: INTRAVENOUS | Status: DC

## 2022-03-31 MED ORDER — EPINEPHRINE PF 1 MG/ML IJ SOLN
INTRAMUSCULAR | Status: AC
  Filled 2022-03-31: qty 4

## 2022-03-31 SURGICAL SUPPLY — 81 items
ADAPTER IRRIG TUBE 2 SPIKE SOL (ADAPTER) ×4 IMPLANT
ADH SKN CLS APL DERMABOND .7 (GAUZE/BANDAGES/DRESSINGS)
ADPR TBG 2 SPK PMP STRL ASCP (ADAPTER) ×4
APL PRP STRL LF DISP 70% ISPRP (MISCELLANEOUS) ×2
BLADE SHAVER 4.5X7 STR FR (MISCELLANEOUS) ×2 IMPLANT
BNDG ADH 2 X3.75 FABRIC TAN LF (GAUZE/BANDAGES/DRESSINGS) ×2 IMPLANT
BNDG ADH XL 3.75X2 STRCH LF (GAUZE/BANDAGES/DRESSINGS) ×2
BUR BR 5.5 WIDE MOUTH (BURR) IMPLANT
CANNULA PART THRD DISP 5.75X7 (CANNULA) IMPLANT
CANNULA PARTIAL THREAD 2X7 (CANNULA) IMPLANT
CANNULA TWIST IN 8.25X9CM (CANNULA) IMPLANT
CHLORAPREP W/TINT 26 (MISCELLANEOUS) ×2 IMPLANT
COOLER POLAR GLACIER W/PUMP (MISCELLANEOUS) ×2 IMPLANT
DERMABOND ADVANCED .7 DNX12 (GAUZE/BANDAGES/DRESSINGS) IMPLANT
DEVICE SUCT BLK HOLE OR FLOOR (MISCELLANEOUS) ×2 IMPLANT
DRAPE 3/4 80X56 (DRAPES) ×2 IMPLANT
DRAPE INCISE IOBAN 66X45 STRL (DRAPES) ×2 IMPLANT
DRAPE U-SHAPE 47X51 STRL (DRAPES) ×4 IMPLANT
DRSG TEGADERM 4X4.75 (GAUZE/BANDAGES/DRESSINGS) ×6 IMPLANT
ELECT REM PT RETURN 9FT ADLT (ELECTROSURGICAL) ×2
ELECTRODE REM PT RTRN 9FT ADLT (ELECTROSURGICAL) ×2 IMPLANT
GAUZE SPONGE 4X4 12PLY STRL (GAUZE/BANDAGES/DRESSINGS) ×2 IMPLANT
GAUZE XEROFORM 1X8 LF (GAUZE/BANDAGES/DRESSINGS) ×2 IMPLANT
GLOVE BIO SURGEON STRL SZ7.5 (GLOVE) ×2 IMPLANT
GLOVE BIOGEL PI IND STRL 8 (GLOVE) ×4 IMPLANT
GLOVE SURG ORTHO 8.0 STRL STRW (GLOVE) ×2 IMPLANT
GLOVE SURG SYN 8.0 (GLOVE) ×2 IMPLANT
GLOVE SURG SYN 8.0 PF PI (GLOVE) ×2 IMPLANT
GOWN STRL REUS W/ TWL LRG LVL3 (GOWN DISPOSABLE) ×4 IMPLANT
GOWN STRL REUS W/TWL LRG LVL3 (GOWN DISPOSABLE) ×4
GOWN STRL REUS W/TWL XL LVL4 (GOWN DISPOSABLE) ×2 IMPLANT
IV LACTATED RINGER IRRG 3000ML (IV SOLUTION) ×8
IV LR IRRIG 3000ML ARTHROMATIC (IV SOLUTION) ×8 IMPLANT
KIT CORKSCREW KNTLS 3.9 S/T/P (INSTRUMENTS) IMPLANT
KIT STABILIZATION SHOULDER (MISCELLANEOUS) ×2 IMPLANT
KIT SUTURETAK 3.0 INSERT PERC (KITS) IMPLANT
KIT TURNOVER KIT A (KITS) ×2 IMPLANT
MANIFOLD NEPTUNE II (INSTRUMENTS) ×4 IMPLANT
MASK FACE SPIDER DISP (MASK) ×2 IMPLANT
MAT ABSORB  FLUID 56X50 GRAY (MISCELLANEOUS) ×4
MAT ABSORB FLUID 56X50 GRAY (MISCELLANEOUS) ×4 IMPLANT
NDL MAYO CATGUT SZ5 (NEEDLE)
NDL SAFETY ECLIP 18X1.5 (MISCELLANEOUS) ×2 IMPLANT
NDL SCORPION MULTI FIRE (NEEDLE) IMPLANT
NDL SUT 5 .5 CRC TPR PNT MAYO (NEEDLE) IMPLANT
NEEDLE SCORPION MULTI FIRE (NEEDLE) IMPLANT
PACK ARTHROSCOPY SHOULDER (MISCELLANEOUS) ×2 IMPLANT
PAD ABD DERMACEA PRESS 5X9 (GAUZE/BANDAGES/DRESSINGS) IMPLANT
PAD ARMBOARD 7.5X6 YLW CONV (MISCELLANEOUS) ×2 IMPLANT
PAD WRAPON POLAR SHDR XLG (MISCELLANEOUS) ×2 IMPLANT
PASSER SUT FIRSTPASS SELF (INSTRUMENTS) ×2 IMPLANT
PASSER SUT SWIFTSTITCH HIP CRT (INSTRUMENTS) ×2 IMPLANT
SHAVER BLADE BONE CUTTER  5.5 (BLADE)
SHAVER BLADE BONE CUTTER 5.5 (BLADE) ×2 IMPLANT
SLEEVE REMOTE CONTROL 5X12 (DRAPES) IMPLANT
SLING ARM M TX990204 (SOFTGOODS) IMPLANT
SLING ULTRA II M (MISCELLANEOUS) ×2 IMPLANT
SPONGE T-LAP 18X18 ~~LOC~~+RFID (SPONGE) ×2 IMPLANT
STAPLER SKIN PROX 35W (STAPLE) IMPLANT
STRAP SAFETY 5IN WIDE (MISCELLANEOUS) ×2 IMPLANT
SUT ETHILON 3-0 (SUTURE) ×2 IMPLANT
SUT LASSO 90 DEG CVD (SUTURE) IMPLANT
SUT LASSO 90 DEG SD STR (SUTURE) IMPLANT
SUT MNCRL 4-0 (SUTURE)
SUT MNCRL 4-0 27XMFL (SUTURE)
SUT PROLENE 0 CT 2 (SUTURE) IMPLANT
SUT VIC AB 0 CT1 36 (SUTURE) IMPLANT
SUT VIC AB 1 CT1 36 (SUTURE) IMPLANT
SUT VIC AB 2-0 CT2 27 (SUTURE) IMPLANT
SUTURE MNCRL 4-0 27XMF (SUTURE) IMPLANT
SUTURE TAPE 1.3 40 TPR END (SUTURE) IMPLANT
SUTURETAPE 1.3 40 TPR END (SUTURE)
TAPE CLOTH 3X10 WHT NS LF (GAUZE/BANDAGES/DRESSINGS) ×2 IMPLANT
TAPE MICROFOAM 4IN (TAPE) ×2 IMPLANT
TRAP FLUID SMOKE EVACUATOR (MISCELLANEOUS) ×2 IMPLANT
TUBE SET DOUBLEFLO INFLOW (TUBING) ×2 IMPLANT
TUBING CONNECTING 10 (TUBING) IMPLANT
TUBING OUTFLOW SET DBLFO PUMP (TUBING) ×2 IMPLANT
WAND WEREWOLF FLOW 90D (MISCELLANEOUS) ×2 IMPLANT
WATER STERILE IRR 500ML POUR (IV SOLUTION) ×2 IMPLANT
WRAPON POLAR PAD SHDR XLG (MISCELLANEOUS) ×2

## 2022-03-31 NOTE — Transfer of Care (Signed)
Immediate Anesthesia Transfer of Care Note  Patient: Kristin [name]  Procedure(s) Performed: SHOULDER ARTHROSCOPY WITH BICEPS TENOTOMY, POSSIBLE ARTHROSCOPIC LYSIS OF ADHESIONS, CAPSULAR RELEASE (Right: Shoulder) MANIPULATION UNDER ANESTHESIA WITH CORTICOSTERIOD INJECTION (Right: Shoulder)  Patient Location: PACU  Anesthesia Type:General  Level of Consciousness: awake  Airway & Oxygen Therapy: Patient Spontanous Breathing  Post-op Assessment: Report given to RN and Post -op Vital signs reviewed and stable  Post vital signs: Reviewed and stable  Last Vitals:  Vitals Value Taken Time  BP 129/65 03/31/22 1023  Temp    Pulse 73 03/31/22 1025  Resp 15 03/31/22 1025  SpO2 100 % 03/31/22 1025  Vitals shown include unvalidated device data.  Last Pain:  Vitals:   03/31/22 0819  TempSrc:   PainSc: 0-No pain         Complications: No notable events documented.

## 2022-03-31 NOTE — Anesthesia Procedure Notes (Signed)
Anesthesia Regional Block: Interscalene brachial plexus block   Pre-Anesthetic Checklist: , timeout performed,  Correct Patient, Correct Site, Correct Laterality,  Correct Procedure, Correct Position, site marked,  Risks and benefits discussed,  Surgical consent,  Pre-op evaluation,  At surgeon's request and post-op pain management  Laterality: Right  Prep: chloraprep       Needles:  Injection technique: Single-shot  Needle Type: Echogenic Needle     Needle Length: 4cm  Needle Gauge: 25     Additional Needles:   Procedures:,,,, ultrasound used (permanent image in chart),,    Narrative:  Start time: 03/31/2022 8:10 AM End time: 03/31/2022 8:15 AM Injection made incrementally with aspirations every 5 mL.  Performed by: Personally  Anesthesiologist: Dimas Millin, MD  Additional Notes: Patient's chart reviewed and they were deemed appropriate candidate for procedure, at surgeon's request. Patient educated about risks, benefits, and alternatives of the block including but not limited to: temporary or permanent nerve damage, bleeding, infection, damage to surround tissues, pneumothorax, hemidiaphragmatic paralysis, unilateral Horner's syndrome, block failure, local anesthetic toxicity. Patient expressed understanding. A formal time-out was conducted consistent with institution rules.  Monitors were applied, and minimal sedation used (see nursing record). The site was prepped with skin prep and allowed to dry, and sterile gloves were used. A high frequency linear ultrasound probe with probe cover was utilized throughout. C5-7 nerve roots located and appeared anatomically normal, local anesthetic injected around them, and echogenic block needle trajectory was monitored throughout. Aspiration performed every 61m. Lung and blood vessels were avoided. All injections were performed without resistance and free of blood and paresthesias. The patient tolerated the procedure well.  Injectate:  240mexparel + 1085m.5% bupivacaine

## 2022-03-31 NOTE — Anesthesia Procedure Notes (Signed)
Procedure Name: Intubation Date/Time: 03/31/2022 8:29 AM  Performed by: Biagio Borg, CRNAPre-anesthesia Checklist: Patient identified, Emergency Drugs available, Suction available and Patient being monitored Patient Re-evaluated:Patient Re-evaluated prior to induction Oxygen Delivery Method: Circle system utilized Preoxygenation: Pre-oxygenation with 100% oxygen Induction Type: IV induction Ventilation: Mask ventilation without difficulty Laryngoscope Size: McGraph and 3 Grade View: Grade I Tube type: Oral Number of attempts: 1 Airway Equipment and Method: Stylet Placement Confirmation: ETT inserted through vocal cords under direct vision, positive ETCO2 and breath sounds checked- equal and bilateral Secured at: 21 cm Tube secured with: Tape Dental Injury: Teeth and Oropharynx as per pre-operative assessment

## 2022-03-31 NOTE — Anesthesia Postprocedure Evaluation (Signed)
Anesthesia Post Note  Patient: Kristin [name]  Procedure(s) Performed: SHOULDER ARTHROSCOPY WITH BICEPS TENOTOMY, ARTHROSCOPIC LYSIS OF ADHESIONS, AND CAPSULAR RELEASE (Right: Shoulder) MANIPULATION UNDER ANESTHESIA WITH CORTICOSTERIOD INJECTION (Right: Shoulder)  Patient location during evaluation: PACU Anesthesia Type: General Level of consciousness: awake and alert Pain management: pain level controlled Vital Signs Assessment: post-procedure vital signs reviewed and stable Respiratory status: spontaneous breathing, nonlabored ventilation, respiratory function stable and patient connected to nasal cannula oxygen Cardiovascular status: blood pressure returned to baseline and stable Postop Assessment: no apparent nausea or vomiting Anesthetic complications: no  No notable events documented.   Last Vitals:  Vitals:   03/31/22 1057 03/31/22 1107  BP: (!) 129/53 (!) 123/44  Pulse: 62 65  Resp: 15 18  Temp: 36.4 C (!) 36 C  SpO2: 100% 100%    Last Pain:  Vitals:   03/31/22 1107  TempSrc: Temporal  PainSc: 0-No pain                 Dimas Millin

## 2022-03-31 NOTE — Op Note (Addendum)
OPERATIVE NOTE SURGERY DATE: 03/31/2022  PRE-OP DIAGNOSIS: 1. Right shoulder adhesive capsulitis and synovitis 2. Right subacromial bursitis 3. Right shoulder adhesions   POST-OP DIAGNOSIS:  1. Right shoulder adhesive capsulitis and synovitis 2. Right subacromial bursitis 3. Right shoulder adhesions 4.  Right shoulder rotator cuff tear at the supraspinatus/infraspinatus junction (longitudinal defect medial to the articular margin)  PROCEDURES: 1. Right shoulder capsular releases with synovectomy, lysis of adhesions, manipulation under anesthesia  2. Right shoulder extensive glenohumeral debridement 3.  Right shoulder rotator cuff repair (side-to-side stitch of longitudinal musculotendinous junction tear of the supraspinatus/infraspinatus border) 4.  Right glenohumeral and subacromial injections with corticosteroid  SURGEON:  Cato Mulligan, MD  ASSISTANT(S):  Anitra Lauth, PA  ANESTHESIA: Regional block with Exparel, Gen  TOTAL IV FLUIDS: per anesthesia record   ESTIMATED BLOOD LOSS: Minimal  DRAINS:  None.  SPECIMENS: None  IMPLANTS: None.  COMPLICATIONS: none  INDICATIONS: Kristin [name] is a 62 y.o. female with complaints of left shoulder pain and stiffness.  She has undergone prior right shoulder capsule release and manipulation anesthesia with arthroscopic biceps tenodesis by me on 08/27/2021.  She was found to have a full-thickness rotator cuff tear at that time.  She had excellent improvement in her range of motion, but did remain symptomatic from the torn rotator cuff.  She then underwent an arthroscopic rotator cuff repair by me on 10/25/2021. Postoperatively, she had good healing of her rotator cuff clinically, but had persistent anterior shoulder pain in the region of the bicipital groove.  She underwent ultrasound-guided tendon sheath injection and had excellent relief of her symptoms, but these only lasted for approximately 1 week.  Additionally, she noticed  that she was developing recurrent stiffness in her shoulder.  Given these findings surgery was recommended for biceps tenotomy, capsular releases, manipulation under anesthesia, and subacromial decompression/bursectomy with corticosteroid injections into glenohumeral joint and subacromial space. After discussion of risks, benefits, and alternatives to surgery, the patient elected to proceed.    OPERATIVE FINDINGS:  Operative Shoulder Range of Motion:  Preop  Postop  Flexion  120 150  Abduction  85 120  ER at 0  20 70  ER at 90  70 100  IR at 90  20 50  IR posterior  T12 T6    Intra-operative findings: A thorough arthroscopic examination of the shoulder was performed.  The findings are: 1. Biceps tendon: Not visualized intra-articularly.  Biceps tendon was visualized from the subacromial space and there was significant bundle of suture within the bicipital groove 2. Superior labrum: injected with surrounding synovitis with type II SLAP tear 3. Posterior labrum and capsule: Significant synovitis; normal posterior labrum 4. Inferior capsule and inferior recess: Significant synovitis; majority prior region of capsule release intact with some recurrent adhesions anteriorly/inferiorly 5. Glenoid cartilage surface: Scattered grade 2-3 degenerative changes 6. Supraspinatus attachment: Intact attachment on the footprint.  There was a small longitudinal bursal sided defect affecting approximately 80% of the tendon located at the infraspinatus/supraspinatus junction medial to the tendon attachment site on the footprint.  Additionally, there was significant loose suture material free within the subacromial space. 7. Posterior rotator cuff attachment:  Normal 8. Humeral head articular cartilage: Focal area of grade 3-4 degenerative change 9. Rotator interval: significant synovitis and thickening of capsule 10: Subscapularis tendon:  Normal attachment; significant adhesions about the subscapularis 11.  Anterior labrum: Mildly synovitic 12. IGHL: significant synovitis around IGHL   DETAILS OF PROCEDURE: The patient was identified in the  preoperative holding area. Informed consent was obtained. Operative extremity was marked. After satisfactory upper extremity regional block with Exparel was performed in the preoperative holding area, the patient was brought to the operating room and placed in a well-padded beach chair positioner.  Eyes were protected, head was affixed in neutral, and the patient was given preoperative IV antibiotics within 30 minutes of the start of the case and a surgical time-out occurred. The upper extremity was prescrubbed with Hibiclens and alcohol, prepped with ChloraPrep and draped in the usual sterile fashion.    I then created a standard posterior portal with an 11 blade. The glenohumeral joint was easily entered with a blunt trochar and the arthroscope introduced. The findings of diagnostic arthroscopy are described above.  A standard anterior portal was made.  The joint was remarkable for moderate synovitis which was chronic in the anterior, superior, posterior, and inferior aspects. This required synovectomy with a shaver and Arthrocare device in the affected compartments listed above.  A combination of electrocautery and oscillating shaver was used to debride the rotator interval tissue.  The posterior aspect of the coracoid was exposed.  The anterior and posterior aspects of the subscapularis were cleared of tissue so there was no tethering.  Adhesions were cleared off the subscapularis to allow full internal and external rotation.  The anterior capsule anterior to the subscapularis remained free did not require further capsular release.  The arthroscope was placed into the anterior portal.  The posterior capsule was quite thickened and inflamed as well.  After synovectomy, the duckbill basket was used to perform release from the superior glenoid, down into the axillary  pouch, around to the anterior band of the IGHL.  Significant portion of the inferior release remained intact, but there was a small band anteriorly/inferiorly.  Complete 360 capsulotomy was verified. Hemostasis was achieved with the ArthroCare wand.  There was no unusual bleeding.  The instruments were removed from the joint.    Manipulation under anesthesia was carried out in a gentle, controlled manner with short lever arms.  There was palpable release of adhesions. See above chart for post-manipulation improvement in range of motion.   The arthroscope was placed in the subacromial space. An accessory lateral portal was established. There was some chronic bursitis filling the whole subacromial space and gutters.  A complete subacromial bursectomy and debridement of the gutters was carried out with a shaver.  ArthroCare was used to control bleeding.  Additionally, there were loose sutures from prior rotator cuff repair.  The sutures appear to be intact from the articular side from the medial row anchor and were loose from the lateral row anchor. These were present above and lateral to the rotator cuff within the subacromial space.  These were removed in their entirety.  In the region of the suture that was removed, there was a longitudinal split at the infraspinatus/supraspinatus junction medial to the rotator cuff footprint and articular margin.  A simple side-to-side stitch of #1 Vicryl was placed and tied arthroscopically to reduce the split.  The footprint of the rotator cuff remained intact.  Next, given that she is having significant pain about the biceps tendon sheath, the biceps tendon and anchor from prior biceps tenodesis were identified.  There was some suture material balled up at the top of the bicipital groove.  This was removed in its entirety and a biceps tenotomy was performed as per the preoperative plan.  The skin was closed with interrupted 3-0 nylon sutures. Injections of  40 mg Kenalog  with 1% lidocaine and 0.5% ropivacaine were placed separately in the glenohumeral joint and subacromial space with a spinal needle.  Xeroform gauze, sterile dressings were applied. The patient was placed in a shoulder sling.  Polar Care was applied.    Instrument, sponge, and needle counts were correct prior to closure and at the conclusion of the case.   Of note, assistance from a PA was essential to performing the surgery.  PA was present for the entire surgery.  PA assisted with patient positioning, retraction, instrumentation, and wound closure. The surgery would have been more difficult and had longer operative time without PA assistance.    DISPOSITION: PACU - hemodynamically stable.  POSTOPERATIVE PLAN: The patient will be discharged home. PT to begin 1-2 days postop for range of motion exercises.  ASA x 2 weeks for DVT ppx. Sling only for comfort and wean this week as soon as tolerated. RTC in ~1 week.

## 2022-03-31 NOTE — Discharge Instructions (Addendum)
Post-Op Instructions - Shoulder Capsular Release/Manipulation Under Anesthesia  1. Bracing: You should wear a sling for comfort only. Sling should NOT be worn longer than ~1 week.   2. Driving: No driving for 1 week post-op. Must be off narcotic pain medication.  3. Activity: No active lifting for ~2 weeks. Perform range of motion exercises DAILY at home and with physical therapy as prescribed.   4. Physical Therapy: This NEEDS to begin 1-2 days after surgery, and proceed ~6-12 weeks. This should be at least 3x/week. You can start home stretching exercises tomorrow as tolerated.  5. Medications:  - You will be provided a prescription for narcotic pain medicine. After surgery, take 1-2 narcotic tablets every 4 hours if needed for severe pain.  - A prescription for anti-nausea medication will be provided in case the narcotic medicine causes nausea - take 1 tablet every 6 hours only if nauseated.   - Take tylenol 1000 mg (2 Extra Strength tablets or 3 regular strength) every 8 hours for pain.  May decrease or stop tylenol 5 days after surgery if you are having minimal pain. - Take ibuprofen '800mg'$  three times/day with food for at least two weeks every day. This will help reduce post-operative inflammation and swelling. Please call our offices if this causes any stomach/GI irritation.  - Take Aspirin '325mg'$ /daily x 2 weeks to help prevent DVT/PE (Blood clots)   If you are taking prescription medication for anxiety, depression, insomnia, muscle spasm, chronic pain, or for attention deficit disorder, you are advised that you are at a higher risk of adverse effects with use of narcotics post-op, including narcotic addiction/dependence, depressed breathing, death. If you use non-prescribed substances: alcohol, marijuana, cocaine, heroin, methamphetamines, etc., you are at a higher risk of adverse effects with use of narcotics post-op, including narcotic addiction/dependence, depressed breathing, death. You  are advised that taking > 50 morphine milligram equivalents (MME) of narcotic pain medication per day results in twice the risk of overdose or death. For your prescription provided: oxycodone 5 mg - taking more than 6 tablets per day would result in > 50 morphine milligram equivalents (MME) of narcotic pain medication. Be advised that we will prescribe narcotics short-term, for acute post-operative pain only - 3 weeks for major operations such as shoulder repair/reconstruction surgeries.    6. Post-Op Appointment:  Your first post-op appointment will be ~1 week post-op.  7. Work or School: For most, but not all procedures, we advise staying out of work or school for at least 1 to 2 weeks in order to recover from the stress of surgery and to allow time for healing.   If you need a work or school note this can be provided.    AMBULATORY SURGERY  DISCHARGE INSTRUCTIONS   The drugs that you were given will stay in your system until tomorrow so for the next 24 hours you should not:  Drive an automobile Make any legal decisions Drink any alcoholic beverage   You may resume regular meals tomorrow.  Today it is better to start with liquids and gradually work up to solid foods.  You may eat anything you prefer, but it is better to start with liquids, then soup and crackers, and gradually work up to solid foods.   Please notify your doctor immediately if you have any unusual bleeding, trouble breathing, redness and pain at the surgery site, drainage, fever, or pain not relieved by medication.    Additional Instructions: PLEASE LEAVE GREEN/TEAL ARMBAND ON FOR 4  DAYS     Please contact your physician with any problems or Same Day Surgery at [phone], Monday through Friday 6 am to 4 pm, or [place] [facility] number at [phone].  POLAR CARE INFORMATION  [email or web address]  How to use [facility] Cold Therapy System?  YouTube    [email or web address]  OPERATING INSTRUCTIONS  Start the product With dry hands, connect the transformer to the electrical connection located on the top of the cooler. Next, plug the transformer into an appropriate electrical outlet. The unit will automatically start running at this point.  To stop the pump, disconnect electrical power.  Unplug to stop the product when not in use. Unplugging the Polar Care unit turns it off. Always unplug immediately after use. Never leave it plugged in while unattended. Remove pad.    FIRST ADD WATER TO FILL LINE, THEN ICE---Replace ice when existing ice is almost melted  1 Discuss Treatment with your [facility] Practitioner and Use Only as Prescribed 2 Apply Insulation Barrier & Cold Therapy Pad 3 Check for Moisture 4 Inspect Skin Regularly  Tips and Trouble Shooting Usage Tips 1. Use cubed or chunked ice for optimal performance. 2. It is recommended to drain the Pad between uses. To drain the pad, hold the Pad upright with the hose pointed toward the ground. Depress the black plunger and allow water to drain out. 3. You may disconnect the Pad from the unit without removing the pad from the affected area by depressing the silver tabs on the hose coupling and gently pulling the hoses apart. The Pad and unit will seal itself and will not leak. Note: Some dripping during release is normal. 4. DO NOT RUN PUMP WITHOUT WATER! The pump in this unit is designed to run with water. Running the unit without water will cause permanent damage to the pump. 5. Unplug unit before removing lid.  TROUBLESHOOTING GUIDE Pump not running, Water not flowing to the pad, Pad is not getting cold 1. Make sure the transformer is plugged into the wall outlet. 2. Confirm that the ice and water are filled to the indicated levels. 3. Make sure there are no kinks in the pad. 4. Gently pull on the blue tube to make sure the tube/pad junction is  straight. 5. Remove the pad from the treatment site and ll it while the pad is lying at; then reapply. 6. Confirm that the pad couplings are securely attached to the unit. Listen for the double clicks (Figure 1) to confirm the pad couplings are securely attached.  Leaks    Note: Some condensation on the lines, controller, and pads is unavoidable, especially in warmer climates. 1. If using a [facility] Polar Care Cold Therapy unit with a detachable Cold Therapy Pad, and a leak exists (other than condensation on the lines) disconnect the pad couplings. Make sure the silver tabs on the couplings are depressed before reconnecting the pad to the pump hose; then confirm both sides of the coupling are properly clicked in. 2. If the coupling continues to leak or a leak is detected in the pad itself, stop using it and call [facility] at [phone].  Cleaning After use, empty and dry the unit with a soft cloth. Warm water and mild detergent may be used occasionally to clean the pump and tubes.  WARNING: The [place] can be cold enough to cause serious injury, including full skin necrosis. Follow these Operating Instructions, and carefully read the Product  Insert (see pouch on side of unit) and the Cold Therapy Pad Fitting Instructions (provided with each Cold Therapy Pad) prior to use.     Interscalene Nerve Block with Exparel   For your surgery you have received an Interscalene Nerve Block with Exparel. Nerve Blocks affect many types of nerves, including nerves that control movement, pain and normal sensation.  You may experience feelings such as numbness, tingling, heaviness, weakness or the inability to move your arm or the feeling or sensation that your arm has "fallen asleep". A nerve block with Exparel can last up to 5 days.  Usually the weakness wears off first.  The tingling and heaviness usually wear off next.  Finally you may start to notice pain.  Keep in mind that this may occur in  any order.  Once a nerve block starts to wear off it is usually completely gone within 60 minutes. ISNB may cause mild shortness of breath, a hoarse voice, blurry vision, unequal pupils, or drooping of the face on the same side as the nerve block.  These symptoms will usually resolve with the numbness.  Very rarely the procedure itself can cause mild seizures. If needed, your surgeon will give you a prescription for pain medication.  It will take about 60 minutes for the oral pain medication to become fully effective.  So, it is recommended that you start taking this medication before the nerve block first begins to wear off, or when you first begin to feel discomfort. Take your pain medication only as prescribed.  Pain medication can cause sedation and decrease your breathing if you take more than you need for the level of pain that you have. Nausea is a common side effect of many pain medications.  You may want to eat something before taking your pain medicine to prevent nausea. After an Interscalene nerve block, you cannot feel pain, pressure or extremes in temperature in the effected arm.  Because your arm is numb it is at an increased risk for injury.  To decrease the possibility of injury, please practice the following:  While you are awake change the position of your arm frequently to prevent too much pressure on any one area for prolonged periods of time.  If you have a cast or tight dressing, check the color or your fingers every couple of hours.  Call your surgeon with the appearance of any discoloration (white or blue). If you are given a sling to wear before you go home, please wear it  at all times until the block has completely worn off.  Do not get up at night without your sling. Please contact [facility] or your surgeon if you do not begin to regain sensation after 7 days from the surgery.  Anesthesia may be contacted by calling the Same Day Surgery Department, Mon. through Fri., 6 am  to 4 pm at [phone].   If you experience any other problems or concerns, please contact your surgeon's office. If you experience severe or prolonged shortness of breath go to the nearest emergency department.

## 2022-03-31 NOTE — Anesthesia Preprocedure Evaluation (Signed)
Anesthesia Evaluation  Patient identified by MRN, date of birth, ID band Patient awake    Reviewed: Allergy & Precautions, NPO status , Patient's Chart, lab work & pertinent test results  History of Anesthesia Complications (+) PONV and history of anesthetic complications  Airway Mallampati: III  TM Distance: <3 FB Neck ROM: full    Dental  (+) Chipped, Dental Advidsory Given   Pulmonary sleep apnea    Pulmonary exam normal        Cardiovascular hypertension, negative cardio ROS Normal cardiovascular exam     Neuro/Psych  Headaches PSYCHIATRIC DISORDERS       Neuromuscular disease    GI/Hepatic Neg liver ROS, hiatal hernia,GERD  Controlled,,  Endo/Other  diabetes, Type 2    Renal/GU      Musculoskeletal   Abdominal   Peds  Hematology negative hematology ROS (+)   Anesthesia Other Findings Past Medical History: No date: ADHD No date: Allergy No date: Arthritis No date: Basal cell carcinoma     Comment:  nose, left cheek, right upper lip, right posterior               shoulder most recent 6 years ago on left cheek 03/26/2021: Basal cell carcinoma     Comment:  L chest, EDC 04/24/2021 No date: Bleeding No date: Chicken pox No date: Chronic diarrhea No date: Complication of anesthesia     Comment:  blood pressure elevated after gastric sleeve surgery in               recovery, pt does not want mask over face; migraines No date: COVID-19     Comment:  03/01/20 had MAB 03/03/20 and felt better next day 03/04/20 No date: Depression No date: Diabetes mellitus without complication (HCC)     Comment:  pre-diabetic currently, was diabetic prior to gastric               sleeve surgery  No date: Disorder of right rotator cuff     Comment:  10/03/19 MRI right shoulder abnormal Dr. Maurine Cane No date: Fall     Comment:  08/2020 No date: Fall     Comment:  03/28/21 No date: Fatty liver No date: Gout 10/15/2018:  Hematuria 06/19/2021: History of shingles     Comment:  lower back and R side of thigh No date: Hyperlipidemia No date: Hypertension     Comment:  no problems since losing weight after gastric sleeve               surgery No date: IBS (irritable bowel syndrome) No date: Insomnia No date: Migraine No date: Neuropathy     Comment:  bilateral feet No date: Obesity No date: Osteopenia No date: PONV (postoperative nausea and vomiting) 03/26/2021: Squamous cell carcinoma of skin     Comment:  SCC IS R mid sternum, [facility] 04/24/2021  Past Surgical History: No date: ABDOMINAL HYSTERECTOMY     Comment:  no h/o abnormal pap still has 1 ovary left 05/29/2016: ANKLE ARTHROSCOPY; Left     Comment:  Procedure: ANKLE ARTHROSCOPY;  Surgeon: [name],               DPM;  Location: [place];  Service: Podiatry;  Laterality:               Left; No date: APPENDECTOMY No date: basal cell removed No date: CESAREAN SECTION; N/A     Comment:  [place] No date: CHOLECYSTECTOMY 01/31/2019: COLONOSCOPY WITH PROPOFOL;  N/A     Comment:  Procedure: COLONOSCOPY WITH PROPOFOL;  Surgeon: Lin Landsman, MD;  Location: [facility] ENDOSCOPY;  Service:               Gastroenterology;  Laterality: N/A; No date: cyst removed from wrist 01/31/2019: ESOPHAGOGASTRODUODENOSCOPY (EGD) WITH PROPOFOL; N/A     Comment:  Procedure: ESOPHAGOGASTRODUODENOSCOPY (EGD) WITH               PROPOFOL;  Surgeon: Lin Landsman, MD;  Location:               [place];  Service: Gastroenterology;  Laterality:               N/A; 05/21/2020: FLEXIBLE SIGMOIDOSCOPY; N/A     Comment:  Procedure: FLEXIBLE SIGMOIDOSCOPY;  Surgeon: Lin Landsman, MD;  Location: [facility] ENDOSCOPY;  Service:               Gastroenterology;  Laterality: N/A; No date: FOOT SURGERY     Comment:  left foot 11/2016 Dr. Laymond Purser  No date: left elbow surgery     Comment:  07/2017 Dr Leanor Kail epicondylitis   08/27/2021: LYSIS OF ADHESION; Right     Comment:  Procedure: Right shoulder arthroscopic lysis of               adhesions, capsular release, and manipulation under               anesthesia with corticosteroid injection and biceps               tenodesis;  Surgeon: Leim Fabry, MD;  Location: [place];  Service: Orthopedics;  Laterality: Right; No date: MOHS SURGERY     Comment:  x 2 face nose and left cheek  05/29/2016: OSTEOCHONDROMA EXCISION; Left     Comment:  Procedure: OSTEOCHONDRAL DRILLING TALUS;  Surgeon:               [name], DPM;  Location: [place] [facility];  Service:               Podiatry;  Laterality: Left; 05/29/2016: PLANTAR FASCIA RELEASE; Left     Comment:  Procedure: ENDOSCOPIC PLANTAR FASCIOTOMY;  Surgeon:               [name], DPM;  Location: [place];  Service:               Podiatry;  Laterality: Left; No date: ROTATOR CUFF REPAIR; Left 2017: STOMACH SURGERY     Comment:  gastric sleeve surgery 08/05/2017: TENNIS ELBOW RELEASE/NIRSCHEL PROCEDURE; Left     Comment:  Procedure: TENNIS ELBOW RELEASE/NIRSCHEL PROCEDURE;                Surgeon: Leanor Kail, MD;  Location: [facility] ORS;                Service: Orthopedics;  Laterality: Left; No date: TUBAL LIGATION 1982: wedge resection of ovary     Reproductive/Obstetrics negative OB ROS                             Anesthesia Physical Anesthesia Plan  ASA: 3  Anesthesia Plan: General ETT  Post-op Pain Management: Regional block   Induction: Intravenous  PONV Risk Score and Plan: Ondansetron, Dexamethasone, Midazolam and Treatment may vary due to age or medical condition  Airway Management Planned: Oral ETT  Additional Equipment:   Intra-op Plan:   Post-operative Plan: Extubation in OR  Informed Consent: I have reviewed the patients History and Physical, chart, labs and discussed the procedure including the risks, benefits and alternatives for  the proposed anesthesia with the patient or authorized representative who has indicated his/her understanding and acceptance.     Dental Advisory Given  Plan Discussed with: Anesthesiologist, CRNA and Surgeon  Anesthesia Plan Comments: (Patient consented for risks of anesthesia including but not limited to:  - adverse reactions to medications - damage to eyes, teeth, lips or other oral mucosa - nerve damage due to positioning  - sore throat or hoarseness - Damage to heart, brain, nerves, lungs, other parts of body or loss of life  Patient voiced understanding.)        Anesthesia Quick Evaluation

## 2022-03-31 NOTE — H&P (Signed)
Paper H&P to be scanned into permanent record. H&P reviewed. No significant changes noted.  

## 2022-04-01 ENCOUNTER — Encounter: Payer: Self-pay | Admitting: Orthopedic Surgery

## 2022-04-02 DIAGNOSIS — M6281 Muscle weakness (generalized): Secondary | ICD-10-CM | POA: Diagnosis not present

## 2022-04-02 DIAGNOSIS — M25611 Stiffness of right shoulder, not elsewhere classified: Secondary | ICD-10-CM | POA: Diagnosis not present

## 2022-04-02 DIAGNOSIS — M25511 Pain in right shoulder: Secondary | ICD-10-CM | POA: Diagnosis not present

## 2022-04-02 DIAGNOSIS — G8929 Other chronic pain: Secondary | ICD-10-CM | POA: Diagnosis not present

## 2022-04-03 ENCOUNTER — Encounter: Payer: Self-pay | Admitting: Psychiatry

## 2022-04-03 ENCOUNTER — Ambulatory Visit: Payer: BC Managed Care – PPO | Admitting: Psychiatry

## 2022-04-03 ENCOUNTER — Telehealth: Payer: Self-pay | Admitting: Family Medicine

## 2022-04-03 VITALS — BP 123/69 | HR 59 | Temp 98.7°F | Ht 63.0 in | Wt 115.8 lb

## 2022-04-03 DIAGNOSIS — G4700 Insomnia, unspecified: Secondary | ICD-10-CM

## 2022-04-03 DIAGNOSIS — Z79899 Other long term (current) drug therapy: Secondary | ICD-10-CM

## 2022-04-03 DIAGNOSIS — F32A Depression, unspecified: Secondary | ICD-10-CM

## 2022-04-03 DIAGNOSIS — F411 Generalized anxiety disorder: Secondary | ICD-10-CM | POA: Diagnosis not present

## 2022-04-03 MED ORDER — BUSPIRONE HCL 10 MG PO TABS: 10.0000 mg | ORAL_TABLET | Freq: Two times a day (BID) | ORAL | 1 refills | Status: DC

## 2022-04-03 MED ORDER — HYDROXYZINE HCL 25 MG PO TABS: 12.5000 mg | ORAL_TABLET | Freq: Three times a day (TID) | ORAL | 1 refills | Status: DC | PRN

## 2022-04-03 NOTE — Telephone Encounter (Signed)
I called the patient because I looked through the chart and found no reason for the labs from the provider so the appointment was cancelled and I informed the patient and she understood.  Kristin [name],cma

## 2022-04-03 NOTE — Patient Instructions (Signed)
[facility] [phone]   [email or web address]  [email or web address]  [email or web address] [address] [place], [place] [address]  ~20.2 mi [phone]  [facility], [email or web address] [address], [place], [place] [address]  ~20.9 mi [phone]   [facility] - [id number]  [facility] - [id number]  [facility] - [place] [phone]  [facility] - [phone]      Hydroxyzine Capsules or Tablets What is this medication? HYDROXYZINE (hye [place] i zeen) treats the symptoms of allergies and allergic reactions. It may also be used to treat anxiety or cause drowsiness before a procedure. It works by blocking histamine, a substance released by the body during an allergic reaction. It belongs to a group of medications called antihistamines. This medicine may be used for other purposes; ask your health care provider or pharmacist if you have questions. COMMON BRAND NAME(S): ANX, Atarax, Rezine, Vistaril What should I tell my care team before I take this medication? They need to know if you have any of these conditions: Glaucoma Heart disease Irregular heartbeat or rhythm Kidney disease Liver disease Lung or breathing disease, such as asthma Stomach or intestine problems Thyroid disease Trouble passing urine An unusual or allergic reaction to hydroxyzine, other medications, foods, dyes or preservatives Pregnant or trying to get pregnant Breastfeeding How should I use this medication? Take this medication by mouth with a full glass of water. Take it as directed on the prescription label at the same time every day. You can take it with or without food. If it upsets your stomach, take it with food. Talk to your care team about the use of this medication in children. While it may be prescribed for children as young as 6 years for  selected conditions, precautions do apply. People 65 years and older may have a stronger reaction and need a smaller dose. Overdosage: If you think you have taken too much of this medicine contact a poison control center or emergency room at once. NOTE: This medicine is only for you. Do not share this medicine with others. What if I miss a dose? If you miss a dose, take it as soon as you can. If it is almost time for your next dose, take only that dose. Do not take double or extra doses. What may interact with this medication? Do not take this medication with any of the following: Cisapride Dronedarone Pimozide Thioridazine This medication may also interact with the following: Alcohol Antihistamines for allergy, cough, and cold Atropine Barbiturate medications for sleep or seizures, such as phenobarbital Certain antibiotics, such as erythromycin or clarithromycin Certain medications for anxiety or sleep Certain medications for bladder problems, such as oxybutynin or tolterodine Certain medications for irregular heartbeat Certain medications for mental health conditions Certain medications for Parkinson disease, such as benztropine, trihexyphenidyl Certain medications for seizures, such as phenobarbital or primidone Certain medications for stomach problems, such as dicyclomine or hyoscyamine Certain medications for travel sickness, such as scopolamine Ipratropium Opioid medications for pain Other medications that cause heart rhythm changes, such as dofetilide This list may not describe all possible interactions. Give your health care provider a list of all the medicines, herbs, non-prescription drugs, or dietary supplements you use. Also tell them if you smoke, drink alcohol, or use illegal drugs. Some items may interact with your medicine. What should I watch for while using this medication? Visit your care  team for regular checks on your progress. Tell your care team if your symptoms  do not start to get better or if they get worse. This medication may affect your coordination, reaction time, or judgment. Do not drive or operate machinery until you know how this medication affects you. Sit up or stand slowly to reduce the risk of dizzy or fainting spells. Drinking alcohol with this medication can increase the risk of these side effects. Your mouth may get dry. Chewing sugarless gum or sucking hard candy and drinking plenty of water may help. Contact your care team if the problem does not go away or is severe. This medication may cause dry eyes and blurred vision. If you wear contact lenses, you may feel some discomfort. Lubricating eye drops may help. See your care team if the problem does not go away or is severe. If you are receiving skin tests for allergies, tell your care team you are taking this medication. What side effects may I notice from receiving this medication? Side effects that you should report to your care team as soon as possible: Allergic reactions--skin rash, itching, hives, swelling of the face, lips, tongue, or throat Heart rhythm changes--fast or irregular heartbeat, dizziness, feeling faint or lightheaded, chest pain, trouble breathing Side effects that usually do not require medical attention (report to your care team if they continue or are bothersome): Confusion Drowsiness Dry mouth Hallucinations Headache This list may not describe all possible side effects. Call your doctor for medical advice about side effects. You may report side effects to FDA at 1-800-FDA-1088. Where should I keep my medication? Keep out of the reach of children and pets. Store at room temperature between 15 and 30 degrees C (59 and 86 degrees F). Keep container tightly closed. Throw away any unused medication after the expiration date. NOTE: This sheet is a summary. It may not cover all possible information. If you have questions about this medicine, talk to your doctor,  pharmacist, or health care provider.  2023 Elsevier/Gold Standard (2004-03-18 00:00:00)

## 2022-04-03 NOTE — Progress Notes (Signed)
Psychiatric Initial Adult Assessment   Patient Identification: Kristin [name] MRN:  [id number] Date of Evaluation:  04/03/2022 Referral Source: Carollee Leitz MD Chief Complaint:   Chief Complaint  Patient presents with   Establish Care   Anxiety   Depression   Insomnia   Attention and concentration problem   Medication Refill   Visit Diagnosis:    ICD-10-CM   1. GAD (generalized anxiety disorder)  F41.1 busPIRone (BUSPAR) 10 MG tablet    Sodium    hydrOXYzine (ATARAX) 25 MG tablet    2. Depression, unspecified depression type  F32.A Sodium    3. Insomnia, unspecified type  G47.00 Sodium    hydrOXYzine (ATARAX) 25 MG tablet    4. High risk medication use  Z79.899 Sodium      History of Present Illness:  Kristin [name] is a 62 year old Caucasian female, married, retired, lives in [place], has a history of diabetes mellitus, polyneuropathy, tendinopathy of right biceps tendon, status post right shoulder arthroscopy with repair of subscapularis and supraspinatus, subacromial decompression, extensive debridement of shoulder and distal clavicle resection, chronic migraine, history of depression, anxiety, ADHD, was evaluated in office today.  Patient today reports she has struggled with anxiety and depression for a very long time however the past several months her depression and anxiety and especially her anxiety has been getting worse.  Patient reports multiple psychosocial stressors including her mother who is diagnosed with cancer, her own medical problems including diagnoses of diabetes mellitus, going through multiple surgeries of her shoulder recently, just had a surgery on Monday, March 31, 2022, her husband going through from work related stressors, her son having personal problems.  Patient reports around the time she was diagnosed with diabetes her sister had a stroke and currently needs support.  Patient reports all this made her anxiety worse.  Patient describes her  anxiety symptoms as feeling nervous, anxious, worrying about things, trouble relaxing, often having racing thoughts.  Patient reports she also has noticed that it is difficult for her to drive or ride in the car anymore.  Patient reports she gets extremely anxious to the point that she has anxiety attacks often even when she is just a passenger in a car.  She reports she constantly worries about the other cars on the road whether something bad will happen to her own car, whether the other person is making the right choices while driving and so on.  Patient reports she is currently on Celexa recently readjusted from a 10 mg to a 20 mg a month ago.  She has not noticed significant side effects and it is probably helping to some extent.  Patient describes her depression symptoms as sadness, feeling hopeless, sleep issues, feeling tired, changes in her appetite, concentration problems.  She reports she has depression symptoms on and off.  Current stressors are also contributing to some of the depression symptoms.  Patient reports sleep issues.  She reports she is on Ambien which helps her to fall asleep.  She however has difficulty staying asleep.  However since she does have surgical pain, that also likely affecting her sleep.  Patient denies any significant history of trauma.  Denies any suicidality, homicidality or perceptual disturbances.  Patient denies any manic or hypomanic symptoms.  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  Patient does report some obsessions-reports she has to have her hangers in a certain order and also has to have certain things in her room in a certain way.  This needs to be explored in future sessions.  Reports she has a diagnosis of ADHD, was diagnosed by [place] [facility] few years  ago.  She reports she was not started on medication since they wanted to address her anxiety first.  She reports she may have tried medications like Prozac in the past for her anxiety.    Associated Signs/Symptoms: Depression Symptoms:  depressed mood, anhedonia, insomnia, difficulty concentrating, anxiety, (Hypo) Manic Symptoms:   Denies Anxiety Symptoms:  Excessive Worry, Psychotic Symptoms:   Denies PTSD Symptoms: Negative  Past Psychiatric History: Patient most recently was under the care of her primary care provider who was prescribing her medications for depression and anxiety.  She reports being tested by a provider for ADHD at [facility] in [place]. Patient denies inpatient behavioral health admissions.  Patient denies suicide attempts.   Previous Psychotropic Medications: Yes past trials of medications Prozac, trazodone, have tried medications like nortriptyline and amitriptyline for headaches, neuropathy.   Substance Abuse History in the last 12 months:  No.  Consequences of Substance Abuse: Negative  Past Medical History:  Past Medical History:  Diagnosis Date   ADHD    Adhesive capsulitis of right shoulder 07/23/2020   Allergy    Annual physical exam 08/19/2019   Anxiety 12/12/2015   Arthritis    Basal cell carcinoma    nose, left cheek, right upper lip, right posterior shoulder most recent 6 years ago on left cheek   Basal cell carcinoma 03/26/2021   L chest, EDC 04/24/2021   Benign essential hypertension 08/14/2014   Bleeding    Bursitis of right shoulder 12/25/2020   Chicken pox    Chronic diarrhea    Chronic intractable headache 10/20/2017   Chronic migraine w/o aura, not intractable, w/o stat migr 31/54/0086   Complication of anesthesia    blood pressure elevated after gastric sleeve surgery in recovery, pt does not want mask over face; migraines   COVID-19    03/01/20 had MAB 03/03/20 and felt better next day 03/04/20   CTS (carpal  tunnel syndrome) 04/05/2020   Dr. Manuella Ghazi s/p [facility]/Lidocaine injection 12/05/19   Depression    Diabetes mellitus without complication ([place])    pre-diabetic currently, was diabetic prior to gastric sleeve surgery    Diabetic polyneuropathy associated with diabetes mellitus due to underlying condition ([place]) 08/08/2020   Disorder of right rotator cuff    10/03/19 MRI right shoulder abnormal Dr. Maurine Cane   Fall    08/2020   Fall    03/28/21   Fatty liver    Fatty liver 05/06/2017   [place] + 2013    Gout    Hematuria 10/15/2018   History of shingles 06/19/2021   lower back and R side of thigh   Hives 03/28/2020   Hyperlipidemia    Hypertension    no problems since losing weight after gastric sleeve surgery   Hypertension 07/17/2014   IBS (irritable bowel syndrome)    Injury of right wrist 06/26/2016   Insomnia    Migraine    Neuropathy    bilateral feet   Obesity    Obesity (BMI 30-39.9) 05/06/2017   Obesity, diabetes, and hypertension syndrome ([place]) 08/18/2019   Osteopenia    Palpitations 08/14/2014   PONV (postoperative nausea and vomiting)  Squamous cell carcinoma of skin 03/26/2021   SCC IS R mid sternum, EDC 04/24/2021   Tendonitis of shoulder, right 12/25/2020    Past Surgical History:  Procedure Laterality Date   ABDOMINAL HYSTERECTOMY     no h/o abnormal pap still has 1 ovary left   ANKLE ARTHROSCOPY Left 05/29/2016   Procedure: ANKLE ARTHROSCOPY;  Surgeon: [name], DPM;  Location: [facility];  Service: Podiatry;  Laterality: Left;   APPENDECTOMY     basal cell removed     [place] WITH STERIOD INJECTION Right 03/31/2022   Procedure: MANIPULATION UNDER ANESTHESIA WITH GLENOHUMERAL AND SUBACROMIAL CORTICOSTERIOD INJECTION;  Surgeon: Leim Fabry, MD;  Location: [facility];  Service: Orthopedics;  Laterality: Right;   COLONOSCOPY WITH PROPOFOL N/A 01/31/2019   Procedure: COLONOSCOPY WITH  PROPOFOL;  Surgeon: Lin Landsman, MD;  Location[facility];  Service: Gastroenterology;  Laterality: N/A;   cyst removed from wrist     ESOPHAGOGASTRODUODENOSCOPY (EGD) WITH PROPOFOL N/A 01/31/2019   Procedure: ESOPHAGOGASTRODUODENOSCOPY (EGD) WITH PROPOFOL;  Surgeon: Lin Landsman, MD;  Location: [facility];  Service: Gastroenterology;  Laterality: N/A;   FLEXIBLE SIGMOIDOSCOPY N/A 05/21/2020   Procedure: FLEXIBLE SIGMOIDOSCOPY;  Surgeon: Lin Landsman, MD;  Location: [facility];  Service: Gastroenterology;  Laterality: N/A;   FOOT SURGERY     left foot 11/2016 Dr. Laymond Purser    left elbow surgery     07/2017 Dr Leanor Kail epicondylitis    LYSIS OF ADHESION Right 08/27/2021   Procedure: Right shoulder arthroscopic lysis of adhesions, capsular release, and manipulation under anesthesia with corticosteroid injection and biceps tenodesis;  Surgeon: Leim Fabry, MD;  Location: [facility];  Service: Orthopedics;  Laterality: Right;   MOHS SURGERY     x 2 face nose and left cheek    OSTEOCHONDROMA EXCISION Left 05/29/2016   Procedure: OSTEOCHONDRAL DRILLING TALUS;  Surgeon: [name], DPM;  Location: [facility];  Service: Podiatry;  Laterality: Left;   PLANTAR FASCIA RELEASE Left 05/29/2016   Procedure: ENDOSCOPIC PLANTAR FASCIOTOMY;  Surgeon: [name], DPM;  Location: [facility];  Service: Podiatry;  Laterality: Left;   ROTATOR CUFF REPAIR Left    SHOULDER ARTHROSCOPY WITH BICEPSTENOTOMY Right 03/31/2022   Procedure: SHOULDER ARTHROSCOPY WITH BICEPS TENOTOMY, ARTHROSCOPIC LYSIS OF ADHESIONS, AND CAPSULAR RELEASE, AND EXTENSIVE GLENOHUMERAL DEBRIDEMENT;  Surgeon: Leim Fabry, MD;  Location: [facility];  Service: Orthopedics;  Laterality: Right;   SHOULDER ARTHROSCOPY WITH ROTATOR CUFF REPAIR  03/31/2022   Procedure: SHOULDER ARTHROSCOPY WITH ROTATOR CUFF REPAIR;  Surgeon: Leim Fabry, MD;  Location: [facility];  Service: Orthopedics;;   STOMACH SURGERY  2017    gastric sleeve surgery   TENNIS ELBOW RELEASE/NIRSCHEL PROCEDURE Left 08/05/2017   Procedure: TENNIS ELBOW RELEASE/NIRSCHEL PROCEDURE;  Surgeon: Leanor Kail, MD;  Location: [facility];  Service: Orthopedics;  Laterality: Left;   TUBAL LIGATION     wedge resection of ovary  1982    Family Psychiatric History: As noted below.  Family History:  Family History  Problem Relation Age of Onset   Heart disease Mother    Breast cancer Mother 74       metastatic, now hip, spine, and leg    Arthritis Mother    Hyperlipidemia Mother    Cancer Mother        spinal surgery. breast dx'ed age 30 now metastatic 34 as of 07/13/18  Cystic fibrosis Mother        carrier    Heart disease Father    Lung cancer Father        smoker   Hyperlipidemia Father    Hypertension Father    Depression Sister    Diabetes Sister    Stroke Sister    Other Sister        covid +   Diabetes Sister    Diabetes Maternal Aunt    Alcohol abuse Maternal Aunt    Diabetes Maternal Aunt    Alcohol abuse Maternal Uncle    Diabetes Maternal Uncle    Alcohol abuse Paternal Uncle    Diabetes Maternal Grandfather    Hyperlipidemia Maternal Grandfather    Arthritis Maternal Grandmother    Sudden death Cousin    Schizophrenia Cousin    Cystic fibrosis Other        great niece    Factor V Leiden deficiency Other        uncle and cousins and m GF (all maternal)   Alcohol abuse Niece    Alcohol abuse Nephew    ADD / ADHD Son    Depression Son    ADD / ADHD Son    Post-traumatic stress disorder Son    Colon cancer Neg Hx    Ovarian cancer Neg Hx     Social History:   Social History   Socioeconomic History   Marital status: Married    Spouse name: Not on file   Number of children: 3   Years of education: Not on file   Highest education level: Some college, no degree  Occupational History   Not on file  Tobacco Use   Smoking status: Never   Smokeless tobacco: Never  Vaping Use   Vaping Use: Never  used  Substance and Sexual Activity   Alcohol use: Never   Drug use: Never   Sexual activity: Yes    Partners: Male    Birth control/protection: Surgical  Other Topics Concern   Not on file  Social History Narrative   Married x 37 years as of 10/2017    Kids 3 boys youngest is Eddie Dibbles   Write childrens books    Retired from [employer] 01/2017    Enjoys traveling    Right handed   Caffeine: quit 2009   Social Determinants of Health   Financial Resource Strain: Low Risk  (02/06/2021)   Overall Financial Resource Strain (CARDIA)    Difficulty of Paying Living Expenses: Not hard at all  Food Insecurity: Not on file  Transportation Needs: Not on file  Physical Activity: Inactive (02/10/2017)   Exercise Vital Sign    Days of Exercise per Week: 0 days    Minutes of Exercise per Session: 0 min  Stress: Not on file  Social Connections: Not on file    Additional Social History: Patient was born in [place], [place].  Patient was raised by both parents.  Her father was a Chief Technology Officer and served in the [place] [employer].  Her family moved every 2 to 4 years.  She had a good childhood.  She has 3 sisters.  Her mother currently lives in [place].  Patient graduated high school, did some years of college.  Patient and her husband have been together since the past 24 years.  She has 3 sons.  She used to work at [employer] currently retired.  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  She is currently working on another book.  Patient currently lives in [place] with her family.  Reports she was born and raised [place].  She does have access to a gun however it safely locked away.  Denies any legal problems.  Allergies:   Allergies  Allergen Reactions   Tetracyclines & Related Swelling and Rash    FACE SWELL, RASH ON CHEST   Amoxicillin    Alcohol Rash    ABDOMINAL CRAMPS, drinking alcohol   Azithromycin Rash   Metoprolol Diarrhea     Metabolic Disorder Labs: Lab Results  Component Value Date   HGBA1C 5.4 01/09/2022   No results found for: "PROLACTIN" Lab Results  Component Value Date   CHOL 136 01/09/2022   TRIG 121.0 01/09/2022   HDL 58.50 01/09/2022   CHOLHDL 2 01/09/2022   VLDL 24.2 01/09/2022   LDLCALC 53 01/09/2022   LDLCALC 69 04/23/2021   Lab Results  Component Value Date   TSH 2.37 01/09/2022    Therapeutic Level Labs: No results found for: "LITHIUM" No results found for: "CBMZ" No results found for: "VALPROATE"  Current Medications: Current Outpatient Medications  Medication Sig Dispense Refill   acetaminophen (TYLENOL) 500 MG tablet Take 2 tablets (1,000 mg total) by mouth every 8 (eight) hours. 90 tablet 2   aspirin EC 325 MG tablet Take 1 tablet (325 mg total) by mouth daily for 14 days. 14 tablet 0   Calcium Citrate-Vitamin D 500-10 MG-MCG CHEW Take 1 tablet two times a day     citalopram (CELEXA) 20 MG tablet Take 1 tablet (20 mg total) by mouth daily. 30 tablet 3   fexofenadine (ALLEGRA) 180 MG tablet Take 1 tablet (180 mg total) by mouth daily as needed for allergies or rhinitis. 90 tablet 3   gabapentin (NEURONTIN) 300 MG capsule TAKE 1 CAPSULE BY MOUTH 3 TIMES DAILY AS NEEDED. 90 capsule 5   glucose blood (FREESTYLE LITE) test strip Use as instructed daily. E11.59, E16.2. dispense one touch verio reflect strips 100 each 12   hydrOXYzine (ATARAX) 25 MG tablet Take 0.5-1 tablets (12.5-25 mg total) by mouth 3 (three) times daily as needed for anxiety (and sleep). 90 tablet 1   montelukast (SINGULAIR) 10 MG tablet Take 1 tablet (10 mg total) by mouth at bedtime. 90 tablet 3   omeprazole (PRILOSEC) 40 MG capsule TAKE 1 CAPSULE (40 MG TOTAL) BY MOUTH DAILY BEFORE SUPPER. 90 capsule 3   ondansetron (ZOFRAN-ODT) 4 MG disintegrating tablet Take 1 tablet (4 mg total) by mouth every 8 (eight) hours as needed. for nausea 20 tablet 2   Rimegepant Sulfate (NURTEC) 75 MG TBDP Take 1 tablet (75 mg  total) by mouth daily as needed (take for abortive therapy of migraine, no more than 1 tablet in 24 hours or 10 per month). 10 tablet 2   rosuvastatin (CRESTOR) 20 MG tablet TAKE 1 TABLET BY MOUTH EVERY DAY 90 tablet 2   tirzepatide (MOUNJARO) 2.5 MG/0.5ML Pen INJECT 2.5 MG SUBCUTANEOUSLY WEEKLY 6 mL 1   topiramate (TOPAMAX) 50 MG tablet Take 1 tablet (50 mg total) by mouth daily. 90 tablet 3   Vitamin D, Ergocalciferol, (DRISDOL) 1.25 MG (50000 UNIT) CAPS capsule Take 1 capsule (50,000 Units total) by mouth every 7 (seven) days. 12 capsule 0   zolpidem (AMBIEN CR) 12.5 MG CR tablet Take 1 tablet (12.5 mg total) by mouth at bedtime as needed. 30 tablet 5   busPIRone (BUSPAR) 10 MG tablet  Take 1 tablet (10 mg total) by mouth 2 (two) times daily. 60 tablet 1   Cyanocobalamin (VITAMIN B-12) 1000 MCG SUBL Place 1 tablet (1,000 mcg total) under the tongue daily. (Patient not taking: Reported on 04/03/2022) 90 tablet 3   dicyclomine (BENTYL) 10 MG capsule Take 10 mg by mouth 4 (four) times daily -  before meals and at bedtime. (Patient not taking: Reported on 04/03/2022)     Multiple Vitamin (MULTIVITAMIN) tablet Take 1 tablet by mouth daily. (Patient not taking: Reported on 04/03/2022)     oxyCODONE (ROXICODONE) 5 MG immediate release tablet Take 1-2 tablets (5-10 mg total) by mouth every 4 (four) hours as needed (pain). (Patient not taking: Reported on 04/03/2022) 30 tablet 0   No current facility-administered medications for this visit.    Musculoskeletal: Strength & Muscle Tone:  Has her right arm in a sling S/P shoulder surgery 03/31/2022 , otherwise WNL Gait & Station: Normal Patient leans: N/A  Psychiatric Specialty Exam: Review of Systems  Musculoskeletal:  Positive for back pain.       S/p shoulder surgery - surgical pain  Psychiatric/Behavioral:  Positive for decreased concentration, dysphoric mood and sleep disturbance. The patient is nervous/anxious.   All other systems reviewed and are  negative.   Blood pressure 123/69, pulse (!) 59, temperature 98.7 F (37.1 C), temperature source Skin, height 5\' 3"  (1.6 m), weight 115 lb 12.8 oz (52.5 kg).Body mass index is 20.51 kg/m.  General Appearance: Casual  Eye Contact:  Fair  Speech:  Clear and Coherent  Volume:  Normal  Mood:  Anxious and Depressed  Affect:  Appropriate  Thought Process:  Goal Directed and Descriptions of Associations: Intact  Orientation:  Full (Time, Place, and Person)  Thought Content:  Logical  Suicidal Thoughts:  No  Homicidal Thoughts:  No  Memory:  Immediate;   Fair Recent;   Fair Remote;   Fair  Judgement:  Fair  Insight:  Fair  Psychomotor Activity:  Normal  Concentration:  Concentration: Fair and Attention Span: Fair  Recall:  of Knowledge:Good  Language: Fair  Akathisia:  No  Handed:  Right  AIMS (if indicated):  not done  Assets:  Communication Skills Desire for Improvement Housing Intimacy Talents/Skills Transportation  ADL's:  Intact  Cognition: WNL  Sleep:  Poor   Screenings: GAD-7    [employer] Visit from 04/03/2022 in [facility] Office Visit from 03/03/2022 in [facility] [place] Visit from 02/13/2022 in [facility] [place] Video Visit from 01/27/2022 in [facility] Visit from 01/09/2022 in [facility]  Total GAD-7 Score 18 16 17 19 17       [employer]    Flowsheet Row Office Visit from 04/03/2022 in [facility] Office Visit from 03/03/2022 in [facility] Visit from 02/13/2022 in [facility] Video Visit from 01/27/2022 in [facility] Visit from 01/09/2022 in [facility]  [facility] Total Score 4 4 4 5 4   PHQ-9 Total Score 21 15 14 21 18       Flowsheet Row Office Visit from 04/03/2022 in [facility] Admission (Discharged) from 03/31/2022 in [facility] Admission (Discharged) from 10/25/2021 in [facility] PERI[facility]  C-SSRS RISK CATEGORY No Risk No Risk No Risk       Assessment and Plan: DARYLE BOYINGTON is a 62 year old Caucasian female, married, retired, lives in [place], has a history of anxiety, depression, ADHD, multiple medical problems including multiple shoulder surgeries status post recent surgery on 03/31/2022, her arm in a sling, currently struggling with worsening anxiety, sleep problems, will benefit from the following plan. The patient demonstrates the following risk factors for suicide: Chronic risk factors for suicide include: psychiatric disorder of anxiety depression and medical illness DM, multiple surgeries . Acute risk factors for suicide include:  mother with cancer, sister had stroke- grief from same . Protective factors for this patient include: positive social support, positive therapeutic relationship, coping skills, hope for the future, religious beliefs against suicide, and life satisfaction. Considering these factors, the overall suicide risk at this point appears to be low. Patient is appropriate for outpatient follow up.  Plan Generalized anxiety disorder-unstable Continue Celexa 20 mg p.o. daily, recently dosage increase by primary care provider. Restart BuSpar 10 mg p.o. twice daily-patient was only using it as needed.  Patient advised to schedule the dosage and take it daily as prescribed. Start hydroxyzine 12.5-25 mg twice a day as needed for severe anxiety. Referral for CBT-patient provided information for therapist in the community-Ms. Felecia Jan and other resources.  Depression unspecified-unstable Celexa  20 mg p.o. daily Referral for CBT  Insomnia-unstable She will need sufficient pain management Continue zolpidem extended release 12.5 mg p.o. nightly Reviewed [place] PMP AWARxE Could use low-dose hydroxyzine at bedtime as needed for sleep.  High risk medication use-will order sodium level.  Patient to go to [facility] lab.  I have reviewed the notes per Dr. Almyra Brace 03/03/2022-patient was prescribed Celexa, BuSpar.    Collaboration of Care: Other patient did sign an ROI to obtain medical records from [facility].  Patient/Guardian was advised Release of Information must be obtained prior to any record release in order to collaborate their care with an outside provider. Patient/Guardian was advised if they have not already done so to contact the registration department to sign all necessary forms in order for [place] to release information regarding their care.   Consent: Patient/Guardian gives verbal consent for treatment and assignment of benefits for services provided during this visit. Patient/Guardian expressed understanding and agreed to proceed.   This note was generated in part or whole with voice recognition software. Voice recognition is usually quite accurate but there are transcription errors that can and very often do occur. I apologize for any typographical errors that were not detected and corrected.     Jomarie Longs, MD 3/15/20248:39 AM

## 2022-04-03 NOTE — Telephone Encounter (Signed)
Patient has a lab appt on 04/09/2022, there are No orders in.

## 2022-04-04 DIAGNOSIS — M25611 Stiffness of right shoulder, not elsewhere classified: Secondary | ICD-10-CM | POA: Diagnosis not present

## 2022-04-04 DIAGNOSIS — M25511 Pain in right shoulder: Secondary | ICD-10-CM | POA: Diagnosis not present

## 2022-04-04 DIAGNOSIS — Z9889 Other specified postprocedural states: Secondary | ICD-10-CM | POA: Diagnosis not present

## 2022-04-04 DIAGNOSIS — G8929 Other chronic pain: Secondary | ICD-10-CM | POA: Diagnosis not present

## 2022-04-07 DIAGNOSIS — Z9889 Other specified postprocedural states: Secondary | ICD-10-CM | POA: Diagnosis not present

## 2022-04-07 DIAGNOSIS — M6281 Muscle weakness (generalized): Secondary | ICD-10-CM | POA: Diagnosis not present

## 2022-04-07 DIAGNOSIS — M25611 Stiffness of right shoulder, not elsewhere classified: Secondary | ICD-10-CM | POA: Diagnosis not present

## 2022-04-07 DIAGNOSIS — M25511 Pain in right shoulder: Secondary | ICD-10-CM | POA: Diagnosis not present

## 2022-04-09 ENCOUNTER — Telehealth: Payer: Self-pay

## 2022-04-09 ENCOUNTER — Other Ambulatory Visit: Payer: BC Managed Care – PPO

## 2022-04-09 DIAGNOSIS — M25511 Pain in right shoulder: Secondary | ICD-10-CM | POA: Diagnosis not present

## 2022-04-09 DIAGNOSIS — M25611 Stiffness of right shoulder, not elsewhere classified: Secondary | ICD-10-CM | POA: Diagnosis not present

## 2022-04-09 DIAGNOSIS — M6281 Muscle weakness (generalized): Secondary | ICD-10-CM | POA: Diagnosis not present

## 2022-04-09 DIAGNOSIS — Z9889 Other specified postprocedural states: Secondary | ICD-10-CM | POA: Diagnosis not present

## 2022-04-09 NOTE — Progress Notes (Signed)
Tomasita Morrow, NP-C Phone: [phone]  Kristin [name] is a 62 y.o. female who presents today for sore throat and trouble swallowing.   Sore Throat: Patient complains of sore throat. Associated symptoms include pain while swallowing and sore throat.Patient reports also having difficulty swallowing. Reports having to eat soft, easy to swallow foods for the last month, such as noodles and soup. She denies any weight loss. Onset of symptoms was 1 month ago, reports worsening symptoms the last 2 days.  She is drinking plenty of fluids. She has not had recent close exposure to someone with proven streptococcal pharyngitis. She is also requesting a refill on her Singulair.    Cough- No  Congestion-    Sinus- No   Chest- No  Post nasal drip- No  Sore throat- Yes  Shortness of breath- No  Fever- No  Fatigue/Myalgia- No Headache- No Nausea/Vomiting- No Taste disturbance- No  Smell disturbance- No  Covid exposure- No  Covid vaccination- x 6  Flu vaccination- UTD  Medications- Allegra   Social History   Tobacco Use  Smoking Status Never  Smokeless Tobacco Never    Current Outpatient Medications on File Prior to Visit  Medication Sig Dispense Refill   acetaminophen (TYLENOL) 500 MG tablet Take 2 tablets (1,000 mg total) by mouth every 8 (eight) hours. 90 tablet 2   aspirin EC 325 MG tablet Take 1 tablet (325 mg total) by mouth daily for 14 days. 14 tablet 0   busPIRone (BUSPAR) 10 MG tablet Take 1 tablet (10 mg total) by mouth 2 (two) times daily. 60 tablet 1   Calcium Citrate-Vitamin D 500-10 MG-MCG CHEW Take 1 tablet two times a day     citalopram (CELEXA) 20 MG tablet Take 1 tablet (20 mg total) by mouth daily. 30 tablet 3   Cyanocobalamin (VITAMIN B-12) 1000 MCG SUBL Place 1 tablet (1,000 mcg total) under the tongue daily. 90 tablet 3   fexofenadine (ALLEGRA) 180 MG tablet Take 1 tablet (180 mg total) by mouth daily as needed for allergies or rhinitis. 90 tablet 3    gabapentin (NEURONTIN) 300 MG capsule TAKE 1 CAPSULE BY MOUTH 3 TIMES DAILY AS NEEDED. 90 capsule 5   hydrOXYzine (ATARAX) 25 MG tablet Take 0.5-1 tablets (12.5-25 mg total) by mouth 3 (three) times daily as needed for anxiety (and sleep). 90 tablet 1   omeprazole (PRILOSEC) 40 MG capsule TAKE 1 CAPSULE (40 MG TOTAL) BY MOUTH DAILY BEFORE SUPPER. 90 capsule 3   ondansetron (ZOFRAN-ODT) 4 MG disintegrating tablet Take 1 tablet (4 mg total) by mouth every 8 (eight) hours as needed. for nausea 20 tablet 2   Rimegepant Sulfate (NURTEC) 75 MG TBDP Take 1 tablet (75 mg total) by mouth daily as needed (take for abortive therapy of migraine, no more than 1 tablet in 24 hours or 10 per month). 10 tablet 2   rosuvastatin (CRESTOR) 20 MG tablet TAKE 1 TABLET BY MOUTH EVERY DAY 90 tablet 2   tirzepatide (MOUNJARO) 2.5 MG/0.5ML Pen INJECT 2.5 MG SUBCUTANEOUSLY WEEKLY 6 mL 1   topiramate (TOPAMAX) 50 MG tablet Take 1 tablet (50 mg total) by mouth daily. 90 tablet 3   Vitamin D, Ergocalciferol, (DRISDOL) 1.25 MG (50000 UNIT) CAPS capsule Take 1 capsule (50,000 Units total) by mouth every 7 (seven) days. 12 capsule 0   zolpidem (AMBIEN CR) 12.5 MG CR tablet Take 1 tablet (12.5 mg total) by mouth at bedtime as needed. 30 tablet 5   No current facility-administered medications  on file prior to visit.    ROS see history of present illness  Objective  Physical Exam Vitals:   04/10/22 0822  BP: 100/84  Pulse: 74  Temp: 98.2 F (36.8 C)  SpO2: 98%    BP Readings from Last 3 Encounters:  04/10/22 100/84  03/31/22 (!) 123/44  03/05/22 132/65   Wt Readings from Last 3 Encounters:  04/10/22 116 lb 12.8 oz (53 kg)  03/31/22 116 lb (52.6 kg)  03/05/22 117 lb 6.4 oz (53.3 kg)    Physical Exam Constitutional:      General: She is not in acute distress.    Appearance: Normal appearance.  HENT:     Head: Normocephalic.     Right Ear: Tympanic membrane normal.     Left Ear: Tympanic membrane normal.      Nose: Nose normal.     Mouth/Throat:     Mouth: Mucous membranes are moist.     Pharynx: Oropharynx is clear. No oropharyngeal exudate or posterior oropharyngeal erythema.  Eyes:     Conjunctiva/sclera: Conjunctivae normal.     Pupils: Pupils are equal, round, and reactive to light.  Cardiovascular:     Rate and Rhythm: Normal rate and regular rhythm.     Heart sounds: Normal heart sounds.  Pulmonary:     Effort: Pulmonary effort is normal.     Breath sounds: Normal breath sounds.  Abdominal:     General: Abdomen is flat. Bowel sounds are normal.     Palpations: Abdomen is soft. There is no mass.     Tenderness: There is no abdominal tenderness.  Lymphadenopathy:     Cervical: No cervical adenopathy.  Skin:    General: Skin is warm and dry.  Neurological:     General: No focal deficit present.     Mental Status: She is alert.  Psychiatric:        Mood and Affect: Mood normal.        Behavior: Behavior normal.    Assessment/Plan: Please see individual problem list.  Dysphagia, unspecified type Assessment & Plan: Symptoms x 1 month, having to eat soft and easy to swallow foods. Will refer to GI for further evaluation.   Orders: -     Ambulatory referral to Gastroenterology  Sore throat Assessment & Plan: Exam WNL. No erythema or enlarged tonsils noted on exam. Referring to GI due to difficulty with swallowing. Encouraged patient to take allergy medications daily and return if symptoms are changing or worsening. Consider referral to ENT if not improving and not related to dysphagia.    Allergic rhinitis, unspecified seasonality, unspecified trigger -     Montelukast Sodium; Take 1 tablet (10 mg total) by mouth at bedtime.  Dispense: 90 tablet; Refill: 3   Return if symptoms worsen or fail to improve.   Tomasita Morrow, NP-C [place]

## 2022-04-09 NOTE — Telephone Encounter (Signed)
Called and spoke with pt and got more info on reason for visit and went over meds and confirmed pharmacy

## 2022-04-10 ENCOUNTER — Ambulatory Visit: Payer: BC Managed Care – PPO | Admitting: Nurse Practitioner

## 2022-04-10 ENCOUNTER — Encounter: Payer: Self-pay | Admitting: Nurse Practitioner

## 2022-04-10 ENCOUNTER — Other Ambulatory Visit: Payer: Self-pay | Admitting: Gastroenterology

## 2022-04-10 ENCOUNTER — Telehealth: Payer: Self-pay | Admitting: Gastroenterology

## 2022-04-10 VITALS — BP 100/84 | HR 74 | Temp 98.2°F | Ht 63.0 in | Wt 116.8 lb

## 2022-04-10 DIAGNOSIS — J309 Allergic rhinitis, unspecified: Secondary | ICD-10-CM | POA: Diagnosis not present

## 2022-04-10 DIAGNOSIS — R1310 Dysphagia, unspecified: Secondary | ICD-10-CM | POA: Diagnosis not present

## 2022-04-10 DIAGNOSIS — J029 Acute pharyngitis, unspecified: Secondary | ICD-10-CM | POA: Insufficient documentation

## 2022-04-10 MED ORDER — OMEPRAZOLE 40 MG PO CPDR: 40.0000 mg | DELAYED_RELEASE_CAPSULE | Freq: Two times a day (BID) | ORAL | 0 refills | Status: DC

## 2022-04-10 MED ORDER — MONTELUKAST SODIUM 10 MG PO TABS: 10.0000 mg | ORAL_TABLET | Freq: Every day | ORAL | 3 refills | Status: DC

## 2022-04-10 MED FILL — Tirzepatide Soln Auto-injector 2.5 MG/0.5ML: SUBCUTANEOUS | 28 days supply | Qty: 2 | Fill #1 | Status: AC

## 2022-04-10 NOTE — Addendum Note (Signed)
Addended by: Ulyess Blossom L on: 04/10/2022 02:42 PM   Modules accepted: Orders

## 2022-04-10 NOTE — Telephone Encounter (Signed)
Pt called needing urgent appointment  problems with swallowing  please return call

## 2022-04-10 NOTE — Assessment & Plan Note (Addendum)
Symptoms x 1 month, having to eat soft and easy to swallow foods. Will refer to GI for further evaluation.

## 2022-04-10 NOTE — Telephone Encounter (Signed)
Increase omeprazole 40 mg to twice daily before breakfast and before dinner I can see her on April 10  RV

## 2022-04-10 NOTE — Assessment & Plan Note (Signed)
Exam WNL. No erythema or enlarged tonsils noted on exam. Referring to GI due to difficulty with swallowing. Encouraged patient to take allergy medications daily and return if symptoms are changing or worsening. Consider referral to ENT if not improving and not related to dysphagia.

## 2022-04-10 NOTE — Telephone Encounter (Signed)
We received a urgent referral for dysphagia. Please advised when you want me to work in patient to the schedule or what recommendations you have for the patient.

## 2022-04-10 NOTE — Telephone Encounter (Signed)
Patient verbalized understanding of instructions. He will increase the medication. Sent new script to the pharmacy. Made appointment 04/30/2022 at 1:30

## 2022-04-11 DIAGNOSIS — M25611 Stiffness of right shoulder, not elsewhere classified: Secondary | ICD-10-CM | POA: Diagnosis not present

## 2022-04-11 DIAGNOSIS — M25511 Pain in right shoulder: Secondary | ICD-10-CM | POA: Diagnosis not present

## 2022-04-11 DIAGNOSIS — M6281 Muscle weakness (generalized): Secondary | ICD-10-CM | POA: Diagnosis not present

## 2022-04-11 DIAGNOSIS — Z9889 Other specified postprocedural states: Secondary | ICD-10-CM | POA: Diagnosis not present

## 2022-04-14 ENCOUNTER — Telehealth: Payer: Self-pay

## 2022-04-14 ENCOUNTER — Other Ambulatory Visit: Payer: Self-pay | Admitting: Family

## 2022-04-14 DIAGNOSIS — M25611 Stiffness of right shoulder, not elsewhere classified: Secondary | ICD-10-CM | POA: Diagnosis not present

## 2022-04-14 DIAGNOSIS — Z9889 Other specified postprocedural states: Secondary | ICD-10-CM | POA: Diagnosis not present

## 2022-04-14 DIAGNOSIS — F39 Unspecified mood [affective] disorder: Secondary | ICD-10-CM

## 2022-04-14 DIAGNOSIS — M6281 Muscle weakness (generalized): Secondary | ICD-10-CM | POA: Diagnosis not present

## 2022-04-14 DIAGNOSIS — M25511 Pain in right shoulder: Secondary | ICD-10-CM | POA: Diagnosis not present

## 2022-04-14 MED ORDER — CITALOPRAM HYDROBROMIDE 20 MG PO TABS: 20.0000 mg | ORAL_TABLET | Freq: Every day | ORAL | 3 refills | Status: DC

## 2022-04-15 ENCOUNTER — Other Ambulatory Visit: Payer: Self-pay | Admitting: Family Medicine

## 2022-04-15 DIAGNOSIS — F39 Unspecified mood [affective] disorder: Secondary | ICD-10-CM

## 2022-04-15 MED ORDER — CITALOPRAM HYDROBROMIDE 20 MG PO TABS: 20.0000 mg | ORAL_TABLET | Freq: Every day | ORAL | 3 refills | Status: DC

## 2022-04-16 ENCOUNTER — Other Ambulatory Visit: Payer: Self-pay | Admitting: Family

## 2022-04-16 ENCOUNTER — Telehealth: Payer: Self-pay | Admitting: Family Medicine

## 2022-04-16 DIAGNOSIS — M6281 Muscle weakness (generalized): Secondary | ICD-10-CM | POA: Diagnosis not present

## 2022-04-16 DIAGNOSIS — G4700 Insomnia, unspecified: Secondary | ICD-10-CM

## 2022-04-16 DIAGNOSIS — M25611 Stiffness of right shoulder, not elsewhere classified: Secondary | ICD-10-CM | POA: Diagnosis not present

## 2022-04-16 DIAGNOSIS — Z9889 Other specified postprocedural states: Secondary | ICD-10-CM | POA: Diagnosis not present

## 2022-04-16 DIAGNOSIS — M25511 Pain in right shoulder: Secondary | ICD-10-CM | POA: Diagnosis not present

## 2022-04-16 MED ORDER — ZOLPIDEM TARTRATE ER 12.5 MG PO TBCR: 12.5000 mg | EXTENDED_RELEASE_TABLET | Freq: Every evening | ORAL | 5 refills | Status: DC | PRN

## 2022-04-16 NOTE — Telephone Encounter (Signed)
I called the pharmacy and cancelled the 30 day supply since the 90 day was sent in.  Dawaun Brancato,cma

## 2022-04-16 NOTE — Telephone Encounter (Signed)
Prescription Request  04/16/2022  LOV: 03/03/2022  What is the name of the medication or equipment? zolpidem (AMBIEN CR) 12.5 MG CR tablet  Have you contacted your pharmacy to request a refill? Yes   Which pharmacy would you like this sent to?   [id number] [address], [place] [address] Phone: [phone] Fax: [phone]      Patient notified that their request is being sent to the clinical staff for review and that they should receive a response within 2 business days.   Please advise at Mobile [phone] (mobile)

## 2022-04-22 DIAGNOSIS — M25511 Pain in right shoulder: Secondary | ICD-10-CM | POA: Diagnosis not present

## 2022-04-22 DIAGNOSIS — M25611 Stiffness of right shoulder, not elsewhere classified: Secondary | ICD-10-CM | POA: Diagnosis not present

## 2022-04-22 DIAGNOSIS — Z9889 Other specified postprocedural states: Secondary | ICD-10-CM | POA: Diagnosis not present

## 2022-04-22 DIAGNOSIS — M6281 Muscle weakness (generalized): Secondary | ICD-10-CM | POA: Diagnosis not present

## 2022-04-25 DIAGNOSIS — Z9889 Other specified postprocedural states: Secondary | ICD-10-CM | POA: Diagnosis not present

## 2022-04-25 DIAGNOSIS — M25511 Pain in right shoulder: Secondary | ICD-10-CM | POA: Diagnosis not present

## 2022-04-25 DIAGNOSIS — M6281 Muscle weakness (generalized): Secondary | ICD-10-CM | POA: Diagnosis not present

## 2022-04-25 DIAGNOSIS — M25611 Stiffness of right shoulder, not elsewhere classified: Secondary | ICD-10-CM | POA: Diagnosis not present

## 2022-04-29 DIAGNOSIS — M25611 Stiffness of right shoulder, not elsewhere classified: Secondary | ICD-10-CM | POA: Diagnosis not present

## 2022-04-29 DIAGNOSIS — M25511 Pain in right shoulder: Secondary | ICD-10-CM | POA: Diagnosis not present

## 2022-04-29 DIAGNOSIS — M6281 Muscle weakness (generalized): Secondary | ICD-10-CM | POA: Diagnosis not present

## 2022-04-29 DIAGNOSIS — Z9889 Other specified postprocedural states: Secondary | ICD-10-CM | POA: Diagnosis not present

## 2022-04-30 ENCOUNTER — Ambulatory Visit (INDEPENDENT_AMBULATORY_CARE_PROVIDER_SITE_OTHER): Payer: BC Managed Care – PPO | Admitting: Gastroenterology

## 2022-04-30 ENCOUNTER — Encounter: Payer: Self-pay | Admitting: Gastroenterology

## 2022-04-30 ENCOUNTER — Other Ambulatory Visit: Payer: Self-pay

## 2022-04-30 VITALS — BP 99/65 | HR 66 | Temp 98.2°F | Ht 63.0 in | Wt 114.5 lb

## 2022-04-30 DIAGNOSIS — R1319 Other dysphagia: Secondary | ICD-10-CM

## 2022-04-30 NOTE — Progress Notes (Signed)
Kristin Repress, MD [address]  [address]  [place], [place] [address]  Main: [phone]  Fax: [phone]    Gastroenterology Consultation  Referring Provider:     Dana Allan, MD Primary Care Physician:  Dana Allan, MD Primary Gastroenterologist:  Dr. Arlyss [name] Reason for Consultation: Dysphagia        HPI:   Kristin [name] is a 62 y.o. female referred by Dr. Dana Allan, MD  for consultation & management of dysphagia.  Patient states that she has been experiencing difficulty swallowing for the last 2 months, predominantly to solids and has been strictly on liquid diet only.  Her lowest weight has been 111 pounds because of difficulty swallowing.  When she called my office end of March due to worsening symptoms of dysphagia, I have advised to increase omeprazole to 40 mg twice daily with follow-up appointment.  Patient reports that she does not have heartburn or reflux or regurgitation.  Apparently, she used to experience occasional episodes of difficulty swallowing in the past, this has progressed within last 2 months.  She is now able to swallow pudding like consistency only.  She regained about 3 to 4 pounds.  She denies any other GI symptoms.  She does have severe environmental allergies.  NSAIDs: None   Antiplts/Anticoagulants/Anti thrombotics: None   GI Procedures: Had a colonoscopy at age 51, reportedly normal   Sigmoidoscopy 05/21/2020 - Normal mucosa in the entire examined colon. - A single (solitary) ulcer in the distal rectum. - Non-bleeding external hemorrhoids. DIAGNOSIS:  A. COLON, RANDOM; COLD BIOPSY:  - BENIGN COLONIC MUCOSA WITH NO SIGNIFICANT HISTOPATHOLOGIC CHANGE.  - NEGATIVE FOR FEATURES OF MICROSCOPIC COLITIS.  - NEGATIVE FOR DYSPLASIA AND MALIGNANCY.    EGD and colonoscopy 01/31/2019 - Normal duodenal bulb and second portion of the duodenum. Biopsied. - Normal stomach. Biopsied. - Esophagogastric landmarks identified. - Normal  gastroesophageal junction and esophagus.   - Preparation of the colon was fair. - Hemorrhoids found on perianal exam. - The examined portion of the ileum was normal. - The entire examined colon is normal. Biopsied. - The distal rectum and anal verge are normal on retroflexion view.   DIAGNOSIS:  A. DUODENUM; COLD BIOPSY:  - DUODENAL MUCOSA WITH NO SIGNIFICANT PATHOLOGIC ALTERATION.  - NEGATIVE FOR FEATURES OF CELIAC DISEASE.  - NEGATIVE FOR DYSPLASIA AND MALIGNANCY.   B. STOMACH; COLD BIOPSY:  - GASTRIC ANTRAL AND OXYNTIC MUCOSA WITH NO SIGNIFICANT PATHOLOGIC  ALTERATION.  - NEGATIVE FOR ACTIVE INFLAMMATION AND H PYLORI.  - NEGATIVE FOR INTESTINAL METAPLASIA, DYSPLASIA, AND MALIGNANCY.   C. COLON, RANDOM; COLD BIOPSY:  - COLONIC MUCOSA WITH NO SIGNIFICANT PATHOLOGIC ALTERATION.  - NEGATIVE FOR MICROSCOPIC COLITIS, DYSPLASIA, AND MALIGNANCY.   D. COLON POLYP, SIGMOID; COLD SNARE:  - TUBULAR ADENOMA.  - NEGATIVE FOR HIGH-GRADE DYSPLASIA AND MALIGNANCY.  She denies family history of inflammatory bowel disease, celiac disease, GI malignancy  Past Medical History:  Diagnosis Date   ADHD    Adhesive capsulitis of right shoulder 07/23/2020   Allergy    Annual physical exam 08/19/2019   Anxiety 12/12/2015   Arthritis    Basal cell carcinoma    nose, left cheek, right upper lip, right posterior shoulder most recent 6 years ago on left cheek   Basal cell carcinoma 03/26/2021   L chest, [facility] 04/24/2021   Benign essential hypertension 08/14/2014   Bleeding    Bursitis of right shoulder 12/25/2020   Chicken pox  Chronic diarrhea    Chronic intractable headache 10/20/2017   Chronic migraine w/o aura, not intractable, w/o stat migr 08/08/2020   Complication of anesthesia    blood pressure elevated after gastric sleeve surgery in recovery, pt does not want mask over face; migraines   COVID-19    03/01/20 had MAB 03/03/20 and felt better next day 03/04/20   CTS (carpal tunnel syndrome)  04/05/2020   Dr. Sherryll Burger s/p [facility]/Lidocaine injection 12/05/19   Depression    Diabetes mellitus without complication    pre-diabetic currently, was diabetic prior to gastric sleeve surgery    Diabetic polyneuropathy associated with diabetes mellitus due to underlying condition 08/08/2020   Disorder of right rotator cuff    10/03/19 MRI right shoulder abnormal Dr. Zettie Cooley   Fall    08/2020   Fall    03/28/21   Fatty liver    Fatty liver 05/06/2017   [place] + 2013    Gout    Hematuria 10/15/2018   History of shingles 06/19/2021   lower back and R side of thigh   Hives 03/28/2020   Hyperlipidemia    Hypertension    no problems since losing weight after gastric sleeve surgery   Hypertension 07/17/2014   IBS (irritable bowel syndrome)    Injury of right wrist 06/26/2016   Insomnia    Migraine    Neuropathy    bilateral feet   Obesity    Obesity (BMI 30-39.9) 05/06/2017   Obesity, diabetes, and hypertension syndrome 08/18/2019   Osteopenia    Palpitations 08/14/2014   PONV (postoperative nausea and vomiting)    Squamous cell carcinoma of skin 03/26/2021   SCC IS R mid sternum, [facility] 04/24/2021   Tendonitis of shoulder, right 12/25/2020    Past Surgical History:  Procedure Laterality Date   ABDOMINAL HYSTERECTOMY     no h/o abnormal pap still has 1 ovary left   ANKLE ARTHROSCOPY Left 05/29/2016   Procedure: ANKLE ARTHROSCOPY;  Surgeon: Felecia Shelling, DPM;  Location: [facility];  Service: Podiatry;  Laterality: Left;   APPENDECTOMY     basal cell removed     CESAREAN SECTION N/A    1985 & 1995   CHOLECYSTECTOMY     CLOSED MANIPULATION SHOULDER WITH STERIOD INJECTION Right 03/31/2022   Procedure: MANIPULATION UNDER ANESTHESIA WITH GLENOHUMERAL AND SUBACROMIAL CORTICOSTERIOD INJECTION;  Surgeon: Signa Kell, MD;  Location: [facility] ORS;  Service: Orthopedics;  Laterality: Right;   COLONOSCOPY WITH PROPOFOL N/A 01/31/2019   Procedure: COLONOSCOPY WITH PROPOFOL;  Surgeon: Toney Reil,  MD;  Location: [facility] ENDOSCOPY;  Service: Gastroenterology;  Laterality: N/A;   cyst removed from wrist     ESOPHAGOGASTRODUODENOSCOPY (EGD) WITH PROPOFOL N/A 01/31/2019   Procedure: ESOPHAGOGASTRODUODENOSCOPY (EGD) WITH PROPOFOL;  Surgeon: Toney Reil, MD;  Location: [facility] END[facility]COPY;  Service: Gastroenterology;  Laterality: N/A;   FLEXIBLE SIGMOIDOSCOPY N/A 05/21/2020   Procedure: FLEXIBLE SIGMOIDOSCOPY;  Surgeon: Toney Reil, MD;  Location: [facility];  Service: Gastroenterology;  Laterality: N/A;   FOOT SURGERY     left foot 11/2016 Dr. Vinnie Level    left elbow surgery     07/2017 Dr Erin Sons epicondylitis    LYSIS OF ADHESION Right 08/27/2021   Procedure: Right shoulder arthroscopic lysis of adhesions, capsular release, and manipulation under anesthesia with corticosteroid injection and biceps tenodesis;  Surgeon: Signa Kell, MD;  Location: [facility];  Service: Orthopedics;  Laterality: Right;   MOHS SURGERY     x 2 face  nose and left cheek    OSTEOCHONDROMA EXCISION Left 05/29/2016   Procedure: OSTEOCHONDRAL DRILLING TALUS;  Surgeon: Felecia ShellingEvans, Brent M, DPM;  Location: [facility] OR;  Service: Podiatry;  Laterality: Left;   PLANTAR FASCIA RELEASE Left 05/29/2016   Procedure: ENDOSCOPIC PLANTAR FASCIOTOMY;  Surgeon: Felecia ShellingEvans, Brent M, DPM;  Location: [facility];  Service: Podiatry;  Laterality: Left;   ROTATOR CUFF REPAIR Left    SHOULDER ARTHROSCOPY WITH BICEPSTENOTOMY Right 03/31/2022   Procedure: SHOULDER ARTHROSCOPY WITH BICEPS TENOTOMY, ARTHROSCOPIC LYSIS OF ADHESIONS, AND CAPSULAR RELEASE, AND EXTENSIVE GLENOHUMERAL DEBRIDEMENT;  Surgeon: Signa KellPatel, Sunny, MD;  Location: [facility] ORS;  Service: Orthopedics;  Laterality: Right;   SHOULDER ARTHROSCOPY WITH ROTATOR CUFF REPAIR  03/31/2022   Procedure: SHOULDER ARTHROSCOPY WITH ROTATOR CUFF REPAIR;  Surgeon: Signa KellPatel, Sunny, MD;  Location: [facility] ORS;  Service: Orthopedics;;   STOMACH SURGERY  2017   gastric sleeve surgery   TENNIS  ELBOW RELEASE/NIRSCHEL PROCEDURE Left 08/05/2017   Procedure: TENNIS ELBOW RELEASE/NIRSCHEL PROCEDURE;  Surgeon: Erin SonsKernodle, Harold, MD;  Location: [facility] ORS;  Service: Orthopedics;  Laterality: Left;   TUBAL LIGATION     wedge resection of ovary  1982     Current Outpatient Medications:    acetaminophen (TYLENOL) 500 MG tablet, Take 2 tablets (1,000 mg total) by mouth every 8 (eight) hours., Disp: 90 tablet, Rfl: 2   busPIRone (BUSPAR) 10 MG tablet, Take 1 tablet (10 mg total) by mouth 2 (two) times daily., Disp: 60 tablet, Rfl: 1   Calcium Citrate-Vitamin D 500-10 MG-MCG CHEW, Take 1 tablet two times a day, Disp: , Rfl:    citalopram (CELEXA) 20 MG tablet, Take 1 tablet (20 mg total) by mouth daily., Disp: 90 tablet, Rfl: 3   Cyanocobalamin (VITAMIN B-12) 1000 MCG SUBL, Place 1 tablet (1,000 mcg total) under the tongue daily., Disp: 90 tablet, Rfl: 3   fexofenadine (ALLEGRA) 180 MG tablet, Take 1 tablet (180 mg total) by mouth daily as needed for allergies or rhinitis., Disp: 90 tablet, Rfl: 3   gabapentin (NEURONTIN) 300 MG capsule, TAKE 1 CAPSULE BY MOUTH 3 TIMES DAILY AS NEEDED., Disp: 90 capsule, Rfl: 5   hydrOXYzine (ATARAX) 25 MG tablet, Take 0.5-1 tablets (12.5-25 mg total) by mouth 3 (three) times daily as needed for anxiety (and sleep)., Disp: 90 tablet, Rfl: 1   montelukast (SINGULAIR) 10 MG tablet, Take 1 tablet (10 mg total) by mouth at bedtime., Disp: 90 tablet, Rfl: 3   omeprazole (PRILOSEC) 40 MG capsule, TAKE 1 CAPSULE (40 MG TOTAL) BY MOUTH 2 (TWO) TIMES DAILY BEFORE A MEAL., Disp: 180 capsule, Rfl: 0   ondansetron (ZOFRAN-ODT) 4 MG disintegrating tablet, Take 1 tablet (4 mg total) by mouth every 8 (eight) hours as needed. for nausea, Disp: 20 tablet, Rfl: 2   Rimegepant Sulfate (NURTEC) 75 MG TBDP, Take 1 tablet (75 mg total) by mouth daily as needed (take for abortive therapy of migraine, no more than 1 tablet in 24 hours or 10 per month)., Disp: 10 tablet, Rfl: 2   rosuvastatin  (CRESTOR) 20 MG tablet, TAKE 1 TABLET BY MOUTH EVERY DAY, Disp: 90 tablet, Rfl: 2   tirzepatide (MOUNJARO) 2.5 MG/0.5ML Pen, INJECT 2.5 MG SUBCUTANEOUSLY WEEKLY, Disp: 6 mL, Rfl: 1   topiramate (TOPAMAX) 50 MG tablet, Take 1 tablet (50 mg total) by mouth daily., Disp: 90 tablet, Rfl: 3   Vitamin D, Ergocalciferol, (DRISDOL) 1.25 MG (50000 UNIT) CAPS capsule, Take 1 capsule (50,000 Units total) by mouth every 7 (seven) days., Disp: 12  capsule, Rfl: 0   zolpidem (AMBIEN CR) 12.5 MG CR tablet, Take 1 tablet (12.5 mg total) by mouth at bedtime as needed., Disp: 30 tablet, Rfl: 5    Family History  Problem Relation Age of Onset   Heart disease Mother    Breast cancer Mother 62       metastatic, now hip, spine, and leg    Arthritis Mother    Hyperlipidemia Mother    Cancer Mother        spinal surgery. breast dx'ed age 60 now metastatic 32 as of 07/13/18   Cystic fibrosis Mother        carrier    Heart disease Father    Lung cancer Father        smoker   Hyperlipidemia Father    Hypertension Father    Depression Sister    Diabetes Sister    Stroke Sister    Other Sister        covid +   Diabetes Sister    Diabetes Maternal Aunt    Alcohol abuse Maternal Aunt    Diabetes Maternal Aunt    Alcohol abuse Maternal Uncle    Diabetes Maternal Uncle    Alcohol abuse Paternal Uncle    Diabetes Maternal Grandfather    Hyperlipidemia Maternal Grandfather    Arthritis Maternal Grandmother    Sudden death Cousin    Schizophrenia Cousin    Cystic fibrosis Other        great niece    Factor V Leiden deficiency Other        uncle and cousins and m GF (all maternal)   Alcohol abuse Niece    Alcohol abuse Nephew    ADD / ADHD Son    Depression Son    ADD / ADHD Son    Post-traumatic stress disorder Son    Colon cancer Neg Hx    Ovarian cancer Neg Hx      Social History   Tobacco Use   Smoking status: Never   Smokeless tobacco: Never  Vaping Use   Vaping Use: Never used  Substance  Use Topics   Alcohol use: Never   Drug use: Never    Allergies as of 04/30/2022 - Review Complete 04/30/2022  Allergen Reaction Noted   Tetracyclines & related Swelling and Rash 06/04/2014   Amoxicillin  12/10/2018   Alcohol Rash 12/07/2014   Azithromycin Rash 07/19/2018   Metoprolol Diarrhea 12/07/2014    Review of Systems:    All systems reviewed and negative except where noted in HPI.   Physical Exam:  BP 99/65 (BP Location: Left Arm, Patient Position: Sitting, Cuff Size: Normal)   Pulse 66   Temp 98.2 F (36.8 C) (Oral)   Ht 5\' 3"  (1.6 m)   Wt 114 lb 8 oz (51.9 kg)   BMI 20.28 kg/m  No LMP recorded. Patient has had a hysterectomy.  General:   Alert, moderately built, moderately nourished, pleasant and cooperative in NAD Head:  Normocephalic and atraumatic. Eyes:  Sclera clear, no icterus.   Conjunctiva pink. Ears:  Normal auditory acuity. Nose:  No deformity, discharge, or lesions. Mouth:  No deformity or lesions,oropharynx pink & moist. Neck:  Supple; no masses or thyromegaly. Lungs:  Respirations even and unlabored.  Clear throughout to auscultation.   No wheezes, crackles, or rhonchi. No acute distress. Heart:  Regular rate and rhythm; no murmurs, clicks, rubs, or gallops. Abdomen:  Normal bowel sounds. Soft, non-tender and non-distended without masses, hepatosplenomegaly or  hernias noted.  No guarding or rebound tenderness.   Rectal: Not performed Msk:  Symmetrical without gross deformities. Good, equal movement & strength bilaterally. Pulses:  Normal pulses noted. Extremities:  No clubbing or edema.  No cyanosis. Neurologic:  Alert and oriented x3;  grossly normal neurologically. Skin:  Intact without significant lesions or rashes. No jaundice. Psych:  Alert and cooperative. Normal mood and affect.  Imaging Studies: Reviewed  Assessment and Plan:   Dieu Minion Budzynski is a 62 y.o. female with history of diabetes, hypertension, fatty liver, obesity,  significantly lost weight by following healthy diet and lifestyle, diabetes well under control, fatty liver has resolved is seen in consultation for 2 months history of progressively worsening dysphagia to solids resulting in unintentional weight loss  Patient had upper endoscopy in 2021 which was unremarkable Recommend repeat upper endoscopy with proximal and distal esophageal biopsies separately If this is unremarkable, recommend esophageal manometry to evaluate for dysmotility/achalasia Continue omeprazole 40 mg once a day at this time Continue soft diet only Check food allergy profile  I have discussed alternative options, risks & benefits,  which include, but are not limited to, bleeding, infection, perforation,respiratory complication & drug reaction.  The patient agrees with this plan & written consent will be obtained.      Follow up based on the above workup   Kristin Repress, MD

## 2022-05-01 ENCOUNTER — Other Ambulatory Visit: Payer: Self-pay | Admitting: Psychiatry

## 2022-05-01 DIAGNOSIS — G4700 Insomnia, unspecified: Secondary | ICD-10-CM

## 2022-05-01 DIAGNOSIS — Z9889 Other specified postprocedural states: Secondary | ICD-10-CM | POA: Diagnosis not present

## 2022-05-01 DIAGNOSIS — M25611 Stiffness of right shoulder, not elsewhere classified: Secondary | ICD-10-CM | POA: Diagnosis not present

## 2022-05-01 DIAGNOSIS — M25511 Pain in right shoulder: Secondary | ICD-10-CM | POA: Diagnosis not present

## 2022-05-01 DIAGNOSIS — M6281 Muscle weakness (generalized): Secondary | ICD-10-CM | POA: Diagnosis not present

## 2022-05-01 DIAGNOSIS — F411 Generalized anxiety disorder: Secondary | ICD-10-CM

## 2022-05-02 ENCOUNTER — Telehealth: Payer: Self-pay | Admitting: Psychiatry

## 2022-05-02 NOTE — Telephone Encounter (Signed)
I have reviewed medical records received from [place] attention specialist-patient meets criteria for ADHD combined type by all criteria except that her attention struggle seems to be due to other comorbidities rather than typical neurodevelopmental ADHD.  She has OCD, social anxiety tendencies and since retiring her anxiety has gotten much worse.  She has sensory processing disorder with auditory sensitivity being the most troublesome at this time in her life.  Once her anxiety is better managed we will consider reevaluating patient for persistent ADHD symptoms.  Will not recommend trial of stimulant with the level of anxiety that she has at this time.

## 2022-05-04 LAB — FOOD ALLERGY PROFILE
Allergen Corn, IgE: <0.1 kU/L
Clam IgE: <0.1 kU/L
Codfish IgE: <0.1 kU/L
Egg White IgE: <0.1 kU/L
Milk IgE: <0.1 kU/L
Peanut IgE: <0.1 kU/L
Scallop IgE: <0.1 kU/L
Sesame Seed IgE: <0.1 kU/L
Shrimp IgE: <0.1 kU/L
Soybean IgE: <0.1 kU/L
Walnut IgE: <0.1 kU/L
Wheat IgE: <0.1 kU/L

## 2022-05-12 ENCOUNTER — Telehealth: Payer: BC Managed Care – PPO | Admitting: Psychiatry

## 2022-05-13 ENCOUNTER — Encounter: Payer: Self-pay | Admitting: Gastroenterology

## 2022-05-19 DIAGNOSIS — M7501 Adhesive capsulitis of right shoulder: Secondary | ICD-10-CM | POA: Diagnosis not present

## 2022-05-19 DIAGNOSIS — M67921 Unspecified disorder of synovium and tendon, right upper arm: Secondary | ICD-10-CM | POA: Diagnosis not present

## 2022-05-20 ENCOUNTER — Ambulatory Visit: Payer: BC Managed Care – PPO | Admitting: Anesthesiology

## 2022-05-20 ENCOUNTER — Other Ambulatory Visit: Payer: Self-pay

## 2022-05-20 ENCOUNTER — Encounter
Admission: RE | Disposition: A | Discharge status: Home / Self Care | Payer: Self-pay | Source: Home / Self Care | Attending: Gastroenterology

## 2022-05-20 ENCOUNTER — Encounter: Payer: Self-pay | Admitting: Gastroenterology

## 2022-05-20 ENCOUNTER — Ambulatory Visit
Admission: RE | Admit: 2022-05-20 | Discharge: 2022-05-20 | Disposition: A | Discharge status: Home / Self Care | Payer: BC Managed Care – PPO | Attending: Gastroenterology | Admitting: Gastroenterology

## 2022-05-20 ENCOUNTER — Telehealth: Payer: Self-pay | Admitting: Gastroenterology

## 2022-05-20 DIAGNOSIS — I10 Essential (primary) hypertension: Secondary | ICD-10-CM | POA: Diagnosis not present

## 2022-05-20 DIAGNOSIS — K3189 Other diseases of stomach and duodenum: Secondary | ICD-10-CM | POA: Diagnosis not present

## 2022-05-20 DIAGNOSIS — Z85828 Personal history of other malignant neoplasm of skin: Secondary | ICD-10-CM | POA: Diagnosis not present

## 2022-05-20 DIAGNOSIS — K219 Gastro-esophageal reflux disease without esophagitis: Secondary | ICD-10-CM | POA: Insufficient documentation

## 2022-05-20 DIAGNOSIS — E785 Hyperlipidemia, unspecified: Secondary | ICD-10-CM | POA: Diagnosis not present

## 2022-05-20 DIAGNOSIS — E1142 Type 2 diabetes mellitus with diabetic polyneuropathy: Secondary | ICD-10-CM | POA: Insufficient documentation

## 2022-05-20 DIAGNOSIS — R1314 Dysphagia, pharyngoesophageal phase: Secondary | ICD-10-CM | POA: Diagnosis not present

## 2022-05-20 DIAGNOSIS — Z8616 Personal history of COVID-19: Secondary | ICD-10-CM | POA: Insufficient documentation

## 2022-05-20 DIAGNOSIS — Z7985 Long-term (current) use of injectable non-insulin antidiabetic drugs: Secondary | ICD-10-CM | POA: Insufficient documentation

## 2022-05-20 DIAGNOSIS — F419 Anxiety disorder, unspecified: Secondary | ICD-10-CM | POA: Diagnosis not present

## 2022-05-20 DIAGNOSIS — K319 Disease of stomach and duodenum, unspecified: Secondary | ICD-10-CM

## 2022-05-20 DIAGNOSIS — R1310 Dysphagia, unspecified: Secondary | ICD-10-CM | POA: Diagnosis not present

## 2022-05-20 DIAGNOSIS — K449 Diaphragmatic hernia without obstruction or gangrene: Secondary | ICD-10-CM | POA: Insufficient documentation

## 2022-05-20 DIAGNOSIS — K760 Fatty (change of) liver, not elsewhere classified: Secondary | ICD-10-CM | POA: Diagnosis not present

## 2022-05-20 DIAGNOSIS — Z9884 Bariatric surgery status: Secondary | ICD-10-CM | POA: Insufficient documentation

## 2022-05-20 DIAGNOSIS — K2289 Other specified disease of esophagus: Secondary | ICD-10-CM | POA: Diagnosis not present

## 2022-05-20 DIAGNOSIS — F32A Depression, unspecified: Secondary | ICD-10-CM | POA: Diagnosis not present

## 2022-05-20 DIAGNOSIS — G629 Polyneuropathy, unspecified: Secondary | ICD-10-CM | POA: Insufficient documentation

## 2022-05-20 DIAGNOSIS — G43709 Chronic migraine without aura, not intractable, without status migrainosus: Secondary | ICD-10-CM | POA: Insufficient documentation

## 2022-05-20 DIAGNOSIS — R1319 Other dysphagia: Secondary | ICD-10-CM

## 2022-05-20 HISTORY — PX: ESOPHAGOGASTRODUODENOSCOPY (EGD) WITH PROPOFOL: SHX5813

## 2022-05-20 LAB — GLUCOSE, CAPILLARY: Glucose-Capillary: 74 mg/dL (ref 70–99)

## 2022-05-20 SURGERY — ESOPHAGOGASTRODUODENOSCOPY (EGD) WITH PROPOFOL
Anesthesia: General

## 2022-05-20 MED ORDER — PROPOFOL 500 MG/50ML IV EMUL
INTRAVENOUS | Status: DC | PRN
  Administered 2022-05-20: 150 ug/kg/min via INTRAVENOUS

## 2022-05-20 MED ORDER — PROPOFOL 10 MG/ML IV BOLUS
INTRAVENOUS | Status: DC | PRN
  Administered 2022-05-20: 60 mg via INTRAVENOUS
  Administered 2022-05-20: 20 mg via INTRAVENOUS

## 2022-05-20 MED ORDER — SODIUM CHLORIDE 0.9 % IV SOLN: INTRAVENOUS | Status: DC

## 2022-05-20 MED ORDER — LIDOCAINE HCL (CARDIAC) PF 100 MG/5ML IV SOSY
PREFILLED_SYRINGE | INTRAVENOUS | Status: DC | PRN
  Administered 2022-05-20: 50 mg via INTRAVENOUS

## 2022-05-20 NOTE — H&P (Signed)
Arlyss Repress, MD [address]  [address]  [place], [place] [address]  Main: [phone]  Fax: [phone] Pager: [phone]  Primary Care Physician:  Dana Allan, MD Primary Gastroenterologist:  Dr. Arlyss Repress  Pre-Procedure History & Physical: HPI:  Kristin [name] is a 62 y.o. female is here for an endoscopy.   Past Medical History:  Diagnosis Date   ADHD    Adhesive capsulitis of right shoulder 07/23/2020   Allergy    Annual physical exam 08/19/2019   Anxiety 12/12/2015   Arthritis    Basal cell carcinoma    nose, left cheek, right upper lip, right posterior shoulder most recent 6 years ago on left cheek   Basal cell carcinoma 03/26/2021   L chest, EDC 04/24/2021   Benign essential hypertension 08/14/2014   Bleeding    Bursitis of right shoulder 12/25/2020   Chicken pox    Chronic diarrhea    Chronic intractable headache 10/20/2017   Chronic migraine w/o aura, not intractable, w/o stat migr 08/08/2020   Complication of anesthesia    blood pressure elevated after gastric sleeve surgery in recovery, pt does not want mask over face; migraines   COVID-19    03/01/20 had MAB 03/03/20 and felt better next day 03/04/20   CTS (carpal tunnel syndrome) 04/05/2020   Dr. Sherryll Burger s/p [facility]/Lidocaine injection 12/05/19   Depression    Diabetes mellitus without complication (HCC)    pre-diabetic currently, was diabetic prior to gastric sleeve surgery    Diabetic polyneuropathy associated with diabetes mellitus due to underlying condition (HCC) 08/08/2020   Disorder of right rotator cuff    10/03/19 MRI right shoulder abnormal Dr. Zettie Cooley   Fall    08/2020   Fall    03/28/21   Fatty liver    Fatty liver 05/06/2017   [place] + 2013    Gout    Hematuria 10/15/2018   History of shingles 06/19/2021   lower back and R side of thigh   Hives 03/28/2020   Hyperlipidemia    Hypertension    no problems since losing weight after gastric sleeve surgery   Hypertension  07/17/2014   IBS (irritable bowel syndrome)    Injury of right wrist 06/26/2016   Insomnia    Migraine    Neuropathy    bilateral feet   Obesity    Obesity (BMI 30-39.9) 05/06/2017   Obesity, diabetes, and hypertension syndrome (HCC) 08/18/2019   Osteopenia    Palpitations 08/14/2014   PONV (postoperative nausea and vomiting)    Squamous cell carcinoma of skin 03/26/2021   SCC IS R mid sternum, EDC 04/24/2021   Tendonitis of shoulder, right 12/25/2020    Past Surgical History:  Procedure Laterality Date   ABDOMINAL HYSTERECTOMY     no h/o abnormal pap still has 1 ovary left   ANKLE ARTHROSCOPY Left 05/29/2016   Procedure: ANKLE ARTHROSCOPY;  Surgeon: Felecia Shelling, DPM;  Location: [facility];  Service: Podiatry;  Laterality: Left;   APPENDECTOMY     basal cell removed     CESAREAN SECTION N/A    1985 & 1995   CHOLECYSTECTOMY     CLOSED MANIPULATION SHOULDER WITH STERIOD INJECTION Right 03/31/2022   Procedure: MANIPULATION UNDER ANESTHESIA WITH GLENOHUMERAL AND SUBACROMIAL CORTICOSTERIOD INJECTION;  Surgeon: Signa Kell, MD;  Location: [facility] ORS;  Service: Orthopedics;  Laterality: Right;   COLONOSCOPY WITH PROPOFOL N/A 01/31/2019   Procedure: COLONOSCOPY WITH PROPOFOL;  Surgeon: Toney Reil, MD;  Location:  [facility] ENDOSCOPY;  Service: Gastroenterology;  Laterality: N/A;   cyst removed from wrist     ESOPHAGOGASTRODUODENOSCOPY (EGD) WITH PROPOFOL N/A 01/31/2019   Procedure: ESOPHAGOGASTRODUODENOSCOPY (EGD) WITH PROPOFOL;  Surgeon: Toney Reil, MD;  Location: [facility] ENDOSCOPY;  Service: Gastroenterology;  Laterality: N/A;   FLEXIBLE SIGMOIDOSCOPY N/A 05/21/2020   Procedure: FLEXIBLE SIGMOIDOSCOPY;  Surgeon: Toney Reil, MD;  Location: [facility] ENDOSCOPY;  Service: Gastroenterology;  Laterality: N/A;   FOOT SURGERY     left foot 11/2016 Dr. Vinnie Level    left elbow surgery     07/2017 Dr Erin Sons epicondylitis    LYSIS OF ADHESION Right 08/27/2021   Procedure:  Right shoulder arthroscopic lysis of adhesions, capsular release, and manipulation under anesthesia with corticosteroid injection and biceps tenodesis;  Surgeon: Signa Kell, MD;  Location: [facility];  Service: Orthopedics;  Laterality: Right;   MOHS SURGERY     x 2 face nose and left cheek    OSTEOCHONDROMA EXCISION Left 05/29/2016   Procedure: OSTEOCHONDRAL DRILLING TALUS;  Surgeon: Felecia Shelling, DPM;  Location: [facility];  Service: Podiatry;  Laterality: Left;   PLANTAR FASCIA RELEASE Left 05/29/2016   Procedure: ENDOSCOPIC PLANTAR FASCIOTOMY;  Surgeon: Felecia Shelling, DPM;  Location: [facility] OR;  Service: Podiatry;  Laterality: Left;   ROTATOR CUFF REPAIR Left    SHOULDER ARTHROSCOPY WITH BICEPSTENOTOMY Right 03/31/2022   Procedure: SHOULDER ARTHROSCOPY WITH BICEPS TENOTOMY, ARTHROSCOPIC LYSIS OF ADHESIONS, AND CAPSULAR RELEASE, AND EXTENSIVE GLENOHUMERAL DEBRIDEMENT;  Surgeon: Signa Kell, MD;  Location: [facility] OR[facility];  Service: Orthopedics;  Laterality: Right;   SHOULDER ARTHROSCOPY WITH ROTATOR CUFF REPAIR  03/31/2022   Procedure: SHOULDER ARTHROSCOPY WITH ROTATOR CUFF REPAIR;  Surgeon: Signa Kell, MD;  Location: [facility] ORS;  Service: Orthopedics;;   STOMACH SURGERY  2017   gastric sleeve surgery   TENNIS ELBOW RELEASE/NIRSCHEL PROCEDURE Left 08/05/2017   Procedure: TENNIS ELBOW RELEASE/NIRSCHEL PROCEDURE;  Surgeon: Erin Sons, MD;  Location: [facility] ORS;  Service: Orthopedics;  Laterality: Left;   TUBAL LIGATION     wedge resection of ovary  1982    Prior to Admission medications   Medication Sig Start Date End Date Taking? Authorizing Provider  acetaminophen (TYLENOL) 500 MG tablet Take 2 tablets (1,000 mg total) by mouth every 8 (eight) hours. 03/31/22 03/31/23  Signa Kell, MD  busPIRone (BUSPAR) 10 MG tablet Take 1 tablet (10 mg total) by mouth 2 (two) times daily. 04/03/22   Jomarie Longs, MD  Calcium Citrate-Vitamin D 500-10 MG-MCG CHEW Take 1 tablet two times a day 01/27/22    Dana Allan, MD  citalopram (CELEXA) 20 MG tablet Take 1 tablet (20 mg total) by mouth daily. 04/15/22   Dana Allan, MD  Cyanocobalamin (VITAMIN B-12) 1000 MCG SUBL Place 1 tablet (1,000 mcg total) under the tongue daily. 01/14/22   Dana Allan, MD  fexofenadine (ALLEGRA) 180 MG tablet Take 1 tablet (180 mg total) by mouth daily as needed for allergies or rhinitis. 02/28/22   Dana Allan, MD  gabapentin (NEURONTIN) 300 MG capsule TAKE 1 CAPSULE BY MOUTH 3 TIMES DAILY AS NEEDED. 03/05/22   Lomax, Amy, NP  hydrOXYzine (ATARAX) 25 MG tablet Take 0.5-1 tablets (12.5-25 mg total) by mouth 3 (three) times daily as needed for anxiety (and sleep). 04/03/22   Jomarie Longs, MD  montelukast (SINGULAIR) 10 MG tablet Take 1 tablet (10 mg total) by mouth at bedtime. 04/10/22   Bethanie Dicker, NP  omeprazole (PRILOSEC) 40 MG capsule TAKE 1 CAPSULE (40 MG  TOTAL) BY MOUTH 2 (TWO) TIMES DAILY BEFORE A MEAL. 04/11/22   Zaid Tomes, Loel Dubonnet, MD  ondansetron (ZOFRAN-ODT) 4 MG disintegrating tablet Take 1 tablet (4 mg total) by mouth every 8 (eight) hours as needed. for nausea 03/05/22   Lomax, Amy, NP  Rimegepant Sulfate (NURTEC) 75 MG TBDP Take 1 tablet (75 mg total) by mouth daily as needed (take for abortive therapy of migraine, no more than 1 tablet in 24 hours or 10 per month). 03/05/22   Lomax, Amy, NP  rosuvastatin (CRESTOR) 20 MG tablet TAKE 1 TABLET BY MOUTH EVERY DAY 10/14/21   McLean-Scocuzza, Pasty Spillers, MD  tirzepatide [facility]) 2.5 MG/0.5ML Pen INJECT 2.5 MG SUBCUTANEOUSLY WEEKLY 03/05/22   Dana Allan, MD  topiramate (TOPAMAX) 50 MG tablet Take 1 tablet (50 mg total) by mouth daily. 03/05/22   Lomax, Amy, NP  Vitamin D, Ergocalciferol, (DRISDOL) 1.25 MG (50000 UNIT) CAPS capsule Take 1 capsule (50,000 Units total) by mouth every 7 (seven) days. 01/14/22   Dana Allan, MD  zolpidem (AMBIEN CR) 12.5 MG CR tablet Take 1 tablet (12.5 mg total) by mouth at bedtime as needed. 04/16/22   Worthy Rancher B, FNP     Allergies as of 04/30/2022 - Review Complete 04/30/2022  Allergen Reaction Noted   Tetracyclines & related Swelling and Rash 06/04/2014   Amoxicillin  12/10/2018   Alcohol Rash 12/07/2014   Azithromycin Rash 07/19/2018   Metoprolol Diarrhea 12/07/2014    Family History  Problem Relation Age of Onset   Heart disease Mother    Breast cancer Mother 47       metastatic, now hip, spine, and leg    Arthritis Mother    Hyperlipidemia Mother    Cancer Mother        spinal surgery. breast dx'ed age 32 now metastatic 19 as of 07/13/18   Cystic fibrosis Mother        carrier    Heart disease Father    Lung cancer Father        smoker   Hyperlipidemia Father    Hypertension Father    Depression Sister    Diabetes Sister    Stroke Sister    Other Sister        covid +   Diabetes Sister    Diabetes Maternal Aunt    Alcohol abuse Maternal Aunt    Diabetes Maternal Aunt    Alcohol abuse Maternal Uncle    Diabetes Maternal Uncle    Alcohol abuse Paternal Uncle    Diabetes Maternal Grandfather    Hyperlipidemia Maternal Grandfather    Arthritis Maternal Grandmother    Sudden death Cousin    Schizophrenia Cousin    Cystic fibrosis Other        great niece    Factor V Leiden deficiency Other        uncle and cousins and m GF (all maternal)   Alcohol abuse Niece    Alcohol abuse Nephew    ADD / ADHD Son    Depression Son    ADD / ADHD Son    Post-traumatic stress disorder Son    Colon cancer Neg Hx    Ovarian cancer Neg Hx     Social History   Socioeconomic History   Marital status: Married    Spouse name: Not on file   Number of children: 3   Years of education: Not on file   Highest education level: Some college, no degree  Occupational History   Not  on file  Tobacco Use   Smoking status: Never   Smokeless tobacco: Never  Vaping Use   Vaping Use: Never used  Substance and Sexual Activity   Alcohol use: Never   Drug use: Never   Sexual activity: Yes     Partners: Male    Birth control/protection: Surgical  Other Topics Concern   Not on file  Social History Narrative   Married x 37 years as of 10/2017    Kids 3 boys youngest is Renae Fickle   Write childrens books    Retired from [employer] 01/2017    Enjoys traveling    Right handed   Caffeine: quit 2009   Social Determinants of Health   Financial Resource Strain: Low Risk  (02/06/2021)   Overall Financial Resource Strain (CARDIA)    Difficulty of Paying Living Expenses: Not hard at all  Food Insecurity: Not on file  Transportation Needs: Not on file  Physical Activity: Inactive (02/10/2017)   Exercise Vital Sign    Days of Exercise per Week: 0 days    Minutes of Exercise per Session: 0 min  Stress: Not on file  Social Connections: Not on file  Intimate Partner Violence: Not on file    Review of Systems: See HPI, otherwise negative ROS  Physical Exam: BP 114/62   Pulse (!) 58   Temp (!) 96.4 F (35.8 C) (Temporal)   Resp 16   Ht 5\' 2"  (1.575 m)   Wt 51.7 kg   SpO2 100%   BMI 20.85 kg/m  General:   Alert,  pleasant and cooperative in NAD Head:  Normocephalic and atraumatic. Neck:  Supple; no masses or thyromegaly. Lungs:  Clear throughout to auscultation.    Heart:  Regular rate and rhythm. Abdomen:  Soft, nontender and nondistended. Normal bowel sounds, without guarding, and without rebound.   Neurologic:  Alert and  oriented x4;  grossly normal neurologically.  Impression/Plan: Bird Tailor Portugal is here for an endoscopy to be performed for dysphagia  Risks, benefits, limitations, and alternatives regarding  endoscopy have been reviewed with the patient.  Questions have been answered.  All parties agreeable.   Lannette Donath, MD  05/20/2022, 9:14 AM

## 2022-05-20 NOTE — Telephone Encounter (Signed)
I informed patient that if the pathology results from upper endoscopy come back normal, next step would be CT scan of her chest and esophageal manometry Her symptoms could be also secondary to hiatal hernia  Let's wait for the pathology results  RV

## 2022-05-20 NOTE — Anesthesia Procedure Notes (Signed)
Date/Time: 05/20/2022 9:19 AM  Performed by: Ginger Carne, CRNAPre-anesthesia Checklist: Patient identified, Emergency Drugs available, Suction available, Patient being monitored and Timeout performed Patient Re-evaluated:Patient Re-evaluated prior to induction Oxygen Delivery Method: Nasal cannula Preoxygenation: Pre-oxygenation with 100% oxygen Induction Type: IV induction

## 2022-05-20 NOTE — Telephone Encounter (Signed)
Pt left message had procedure today has questions about getting her CT scanplease return call

## 2022-05-20 NOTE — Anesthesia Postprocedure Evaluation (Signed)
Anesthesia Post Note  Patient: Kristin [name]  Procedure(s) Performed: ESOPHAGOGASTRODUODENOSCOPY (EGD) WITH PROPOFOL  Patient location during evaluation: Endoscopy Anesthesia Type: General Level of consciousness: awake and alert Pain management: pain level controlled Vital Signs Assessment: post-procedure vital signs reviewed and stable Respiratory status: spontaneous breathing, nonlabored ventilation, respiratory function stable and patient connected to nasal cannula oxygen Cardiovascular status: blood pressure returned to baseline and stable Postop Assessment: no apparent nausea or vomiting Anesthetic complications: no   No notable events documented.   Last Vitals:  Vitals:   05/20/22 0939 05/20/22 0940  BP: 113/62   Pulse: (!) 59 (!) 59  Resp: 14 14  Temp:    SpO2: 100% 99%    Last Pain:  Vitals:   05/20/22 0947  TempSrc:   PainSc: 0-No pain                 Corinda Gubler

## 2022-05-20 NOTE — Op Note (Signed)
[facility] Gastroenterology Patient Name: Kristin [name] Procedure Date: 05/20/2022 9:18 AM MRN: [id number] Account #: [id number] Date of Birth: 28-Dec-1960 Admit Type: Outpatient Age: 62 Room: [facility] ENDO ROOM 3 Gender: Female Note Status: Finalized Instrument Name: Upper Endoscope [id number] Procedure:             Upper GI endoscopy Indications:           Dysphagia Providers:             Toney Reil MD, MD Referring MD:          No Local Md, MD (Referring MD) Medicines:             General Anesthesia Complications:         No immediate complications. Estimated blood loss: None. Procedure:             Pre-Anesthesia Assessment:                        - Prior to the procedure, a History and Physical was                         performed, and patient medications and allergies were                         reviewed. The patient is competent. The risks and                         benefits of the procedure and the sedation options and                         risks were discussed with the patient. All questions                         were answered and informed consent was obtained.                         Patient identification and proposed procedure were                         verified by the physician, the nurse, the                         anesthesiologist, the anesthetist and the technician                         in the pre-procedure area in the procedure room in the                         endoscopy suite. Mental Status Examination: alert and                         oriented. Airway Examination: normal oropharyngeal                         airway and neck mobility. Respiratory Examination:                         clear to auscultation. CV Examination: normal.  Prophylactic Antibiotics: The patient does not require                         prophylactic antibiotics. Prior Anticoagulants: The                         patient has taken no  anticoagulant or antiplatelet                         agents. ASA Grade Assessment: II - A patient with mild                         systemic disease. After reviewing the risks and                         benefits, the patient was deemed in satisfactory                         condition to undergo the procedure. The anesthesia                         plan was to use general anesthesia. Immediately prior                         to administration of medications, the patient was                         re-assessed for adequacy to receive sedatives. The                         heart rate, respiratory rate, oxygen saturations,                         blood pressure, adequacy of pulmonary ventilation, and                         response to care were monitored throughout the                         procedure. The physical status of the patient was                         re-assessed after the procedure.                        After obtaining informed consent, the endoscope was                         passed under direct vision. Throughout the procedure,                         the patient's blood pressure, pulse, and oxygen                         saturations were monitored continuously. The Endoscope                         was introduced through the mouth, and advanced to the  second part of duodenum. The upper GI endoscopy was                         accomplished without difficulty. The patient tolerated                         the procedure well. Findings:      The duodenal bulb and second portion of the duodenum were normal.      Striped mildly erythematous mucosa without bleeding was found in the       gastric antrum. Biopsies were taken with a cold forceps for histology.      The gastric body and incisura were normal. Biopsies were taken with a       cold forceps for histology.      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 The proximal extent of the       gastric  folds (end of tubular esophagus) was 36 cm from the incisors.       The hiatal narrowing was 39 cm from the incisors. The Z-line was 36 cm       from the incisors.      The gastroesophageal junction and examined esophagus were normal.       Biopsies were obtained from the proximal and distal esophagus with cold       forceps for histology of suspected eosinophilic esophagitis. Impression:            - Normal duodenal bulb and second portion of the                         duodenum.                        - Erythematous mucosa in the antrum. Biopsied.                        - Normal gastric body and incisura. Biopsied.                        - 3 cm paraesophageal hernia.                        - Normal gastroesophageal junction and esophagus.                        - Biopsies were taken with a cold forceps for                         evaluation of eosinophilic esophagitis. Recommendation:        - Discharge patient to home (with escort).                        - Resume previous diet today.                        - Continue present medications.                        - Follow an antireflux regimen. Procedure Code(s):     --- Professional ---  [address], Esophagogastroduodenoscopy, flexible,                         transoral; with biopsy, single or multiple Diagnosis Code(s):     --- Professional ---                        K31.89, Other diseases of stomach and duodenum                        K44.9, Diaphragmatic hernia without obstruction or                         gangrene                        R13.10, Dysphagia, unspecified CPT copyright 2022 American Medical Association. All rights reserved. The codes documented in this report are preliminary and upon coder review may  be revised to meet current compliance requirements. Dr. Libby Maw Toney Reil MD, MD 05/20/2022 9:36:33 AM This report has been signed electronically. Number of Addenda: 0 Note Initiated On:  05/20/2022 9:18 AM Estimated Blood Loss:  Estimated blood loss: none.      [facility]

## 2022-05-20 NOTE — Transfer of Care (Signed)
Immediate Anesthesia Transfer of Care Note  Patient: Kristin [name]  Procedure(s) Performed: ESOPHAGOGASTRODUODENOSCOPY (EGD) WITH PROPOFOL  Patient Location: Endoscopy Unit  Anesthesia Type:General  Level of Consciousness: sedated and responds to stimulation  Airway & Oxygen Therapy: Patient Spontanous Breathing  Post-op Assessment: Report given to RN and Post -op Vital signs reviewed and stable  Post vital signs: Reviewed and stable  Last Vitals:  Vitals Value Taken Time  BP    Temp    Pulse 59 05/20/22 0940  Resp 14 05/20/22 0940  SpO2 99 % 05/20/22 0940    Last Pain:  Vitals:   05/20/22 0938  TempSrc: Temporal  PainSc: 0-No pain         Complications: No notable events documented.

## 2022-05-20 NOTE — Anesthesia Preprocedure Evaluation (Signed)
Anesthesia Evaluation  Patient identified by MRN, date of birth, ID band Patient awake    Reviewed: Allergy & Precautions, NPO status , Patient's Chart, lab work & pertinent test results  History of Anesthesia Complications (+) PONV and history of anesthetic complications  Airway Mallampati: II  TM Distance: >3 FB Neck ROM: Full    Dental no notable dental hx. (+) Teeth Intact   Pulmonary neg pulmonary ROS, neg sleep apnea, neg COPD, Patient abstained from smoking.Not current smoker   Pulmonary exam normal breath sounds clear to auscultation       Cardiovascular Exercise Tolerance: Good METShypertension, (-) CAD and (-) Past MI (-) dysrhythmias  Rhythm:Regular Rate:Normal - Systolic murmurs    Neuro/Psych  Headaches PSYCHIATRIC DISORDERS Anxiety Depression       GI/Hepatic ,GERD  ,,(+)     (-) substance abuse    Endo/Other  diabetes  GLP1 held over 1 week  Renal/GU negative Renal ROS     Musculoskeletal   Abdominal   Peds  Hematology   Anesthesia Other Findings Past Medical History: No date: ADHD 07/23/2020: Adhesive capsulitis of right shoulder No date: Allergy 08/19/2019: Annual physical exam 12/12/2015: Anxiety No date: Arthritis No date: Basal cell carcinoma     Comment:  nose, left cheek, right upper lip, right posterior               shoulder most recent 6 years ago on left cheek 03/26/2021: Basal cell carcinoma     Comment:  L chest, [facility] 04/24/2021 08/14/2014: Benign essential hypertension No date: Bleeding 12/25/2020: Bursitis of right shoulder No date: Chicken pox No date: Chronic diarrhea 10/20/2017: Chronic intractable headache 08/08/2020: Chronic migraine w/o aura, not intractable, w/o stat migr No date: Complication of anesthesia     Comment:  blood pressure elevated after gastric sleeve surgery in               recovery, pt does not want mask over face; migraines No date: COVID-19      Comment:  03/01/20 had MAB 03/03/20 and felt better next day 03/04/20 04/05/2020: CTS (carpal tunnel syndrome)     Comment:  Dr. Sherryll Burger s/p [facility]/Lidocaine injection 12/05/19 No date: Depression No date: Diabetes mellitus without complication (HCC)     Comment:  pre-diabetic currently, was diabetic prior to gastric               sleeve surgery  08/08/2020: Diabetic polyneuropathy associated with diabetes mellitus  due to underlying condition (HCC) No date: Disorder of right rotator cuff     Comment:  10/03/19 MRI right shoulder abnormal Dr. Zettie Cooley No date: Fall     Comment:  08/2020 No date: Fall     Comment:  03/28/21 No date: Fatty liver 05/06/2017: Fatty liver     Comment:  [place] + 2013  No date: Gout 10/15/2018: Hematuria 06/19/2021: History of shingles     Comment:  lower back and R side of thigh 03/28/2020: Hives No date: Hyperlipidemia No date: Hypertension     Comment:  no problems since losing weight after gastric sleeve               surgery 07/17/2014: Hypertension No date: IBS (irritable bowel syndrome) 06/26/2016: Injury of right wrist No date: Insomnia No date: Migraine No date: Neuropathy     Comment:  bilateral feet No date: Obesity 05/06/2017: Obesity (BMI 30-39.9) 08/18/2019: Obesity, diabetes, and hypertension syndrome (HCC) No date: Osteopenia 08/14/2014: Palpitations No date: PONV (postoperative nausea and vomiting)  03/26/2021: Squamous cell carcinoma of skin     Comment:  SCC IS R mid sternum, [facility] 04/24/2021 12/25/2020: Tendonitis of shoulder, right  Reproductive/Obstetrics                             Anesthesia Physical Anesthesia Plan  ASA: 2  Anesthesia Plan: General   Post-op Pain Management: Minimal or no pain anticipated   Induction: Intravenous  PONV Risk Score and Plan: 4 or greater and Propofol infusion, TIVA and Ondansetron  Airway Management Planned: Nasal Cannula  Additional Equipment: None  Intra-op Plan:    Post-operative Plan:   Informed Consent: I have reviewed the patients History and Physical, chart, labs and discussed the procedure including the risks, benefits and alternatives for the proposed anesthesia with the patient or authorized representative who has indicated his/her understanding and acceptance.     Dental advisory given  Plan Discussed with: CRNA and Surgeon  Anesthesia Plan Comments: (Discussed risks of anesthesia with patient, including possibility of difficulty with spontaneous ventilation under anesthesia necessitating airway intervention, PONV, and rare risks such as cardiac or respiratory or neurological events, and allergic reactions. Discussed the role of CRNA in patient's perioperative care. Patient understands.)       Anesthesia Quick Evaluation

## 2022-05-20 NOTE — Telephone Encounter (Signed)
I can not pull up the EGD report from today. Is patient needing a CT scan perform?

## 2022-05-21 ENCOUNTER — Encounter: Payer: Self-pay | Admitting: Gastroenterology

## 2022-05-21 ENCOUNTER — Telehealth (INDEPENDENT_AMBULATORY_CARE_PROVIDER_SITE_OTHER): Payer: BC Managed Care – PPO | Admitting: Psychiatry

## 2022-05-21 DIAGNOSIS — F902 Attention-deficit hyperactivity disorder, combined type: Secondary | ICD-10-CM | POA: Insufficient documentation

## 2022-05-21 DIAGNOSIS — R41840 Attention and concentration deficit: Secondary | ICD-10-CM

## 2022-05-21 DIAGNOSIS — F411 Generalized anxiety disorder: Secondary | ICD-10-CM

## 2022-05-21 DIAGNOSIS — F330 Major depressive disorder, recurrent, mild: Secondary | ICD-10-CM

## 2022-05-21 DIAGNOSIS — G4700 Insomnia, unspecified: Secondary | ICD-10-CM

## 2022-05-21 DIAGNOSIS — F39 Unspecified mood [affective] disorder: Secondary | ICD-10-CM | POA: Insufficient documentation

## 2022-05-21 LAB — SURGICAL PATHOLOGY

## 2022-05-21 MED ORDER — CITALOPRAM HYDROBROMIDE 40 MG PO TABS
40.0000 mg | ORAL_TABLET | Freq: Every day | ORAL | 0 refills | Status: DC
Start: 2022-05-21 — End: 2022-08-15

## 2022-05-21 NOTE — Telephone Encounter (Signed)
Called Patient and she verbalized understanding of instructions

## 2022-05-21 NOTE — Progress Notes (Signed)
Virtual Visit via Video Note  I connected with Kristin [name] on 05/21/22 at  2:00 PM EDT by a video enabled telemedicine application and verified that I am speaking with the correct person using two identifiers.  Location Provider Location : [facility] Patient Location : Home  Participants: Patient , Provider   I discussed the limitations of evaluation and management by telemedicine and the availability of in person appointments. The patient expressed understanding and agreed to proceed.   I discussed the assessment and treatment plan with the patient. The patient was provided an opportunity to ask questions and all were answered. The patient agreed with the plan and demonstrated an understanding of the instructions.   The patient was advised to call back or seek an in-person evaluation if the symptoms worsen or if the condition fails to improve as anticipated.    [facility] MD OP Progress Note  05/21/2022 5:17 PM Kristin [name]  MRN:  [id number]  Chief Complaint:  Chief Complaint  Patient presents with   Anxiety   Depression   Follow-up   ADD   Medication Refill   Insomnia   HPI: Kristin [name] is a 62 year old Caucasian female, married, retired, lives in [place], has a history of anxiety, depression, insomnia, diabetes mellitus, polyneuropathy, tendinopathy of right biceps tendon status post right shoulder arthroscopy with repair of subscapularis and supraspinatus, subacromial decompression with extensive debridement of shoulder and distal clavicle resection, chronic migraine was evaluated by telemedicine today.  Patient today reports she continues to struggle with anxiety, reports she has episodes when she feels extremely overwhelmed, anxious, restless and is unable to focus.  She also reports depression symptoms like sadness, low motivation,.  Patient continues to struggle with concentration and focus.  Patient reports she however has a lot going on.  She is caring for  her mother who is elderly and needs a lot of support.  Patient reports she is also currently struggling with neck pain.  She is unable to sleep at night and pain does wake her up.  Ambien used to be helpful in the past.  Patient reports she was able to establish care with Ms. Felecia Jan and has first appointment coming up.  Motivated to keep it.  Patient denies any suicidality, homicidality or perceptual disturbances.  Currently compliant on medications, denies side effects.  Patient denies any other concerns today.  Visit Diagnosis:    ICD-10-CM   1. GAD (generalized anxiety disorder)  F41.1 citalopram (CELEXA) 40 MG tablet    2. MDD (major depressive disorder), recurrent episode, mild (HCC)  F33.0 citalopram (CELEXA) 40 MG tablet    3. Insomnia, unspecified type  G47.00     4. Attention and concentration deficit  R41.840       Past Psychiatric History: I have reviewed past psychiatric history from progress note on 04/03/2022.  Past trials of medications like Prozac, trazodone, nortriptyline, amitriptyline for headaches and neuropathy  Past Medical History:  Past Medical History:  Diagnosis Date   ADHD    Adhesive capsulitis of right shoulder 07/23/2020   Allergy    Annual physical exam 08/19/2019   Anxiety 12/12/2015   Arthritis    Basal cell carcinoma    nose, left cheek, right upper lip, right posterior shoulder most recent 6 years ago on left cheek   Basal cell carcinoma 03/26/2021   L chest, [facility] 04/24/2021   Benign essential hypertension 08/14/2014   Bleeding    Bursitis of right shoulder 12/25/2020   Chicken pox  Chronic diarrhea    Chronic intractable headache 10/20/2017   Chronic migraine w/o aura, not intractable, w/o stat migr 08/08/2020   Complication of anesthesia    blood pressure elevated after gastric sleeve surgery in recovery, pt does not want mask over face; migraines   COVID-19    03/01/20 had MAB 03/03/20 and felt better next day 03/04/20   CTS  (carpal tunnel syndrome) 04/05/2020   Dr. Sherryll Burger s/p [facility]/Lidocaine injection 12/05/19   Depression    Diabetes mellitus without complication (HCC)    pre-diabetic currently, was diabetic prior to gastric sleeve surgery    Diabetic polyneuropathy associated with diabetes mellitus due to underlying condition (HCC) 08/08/2020   Disorder of right rotator cuff    10/03/19 MRI right shoulder abnormal Dr. Zettie Cooley   Fall    08/2020   Fall    03/28/21   Fatty liver    Fatty liver 05/06/2017   [place] + 2013    Gout    Hematuria 10/15/2018   History of shingles 06/19/2021   lower back and R side of thigh   Hives 03/28/2020   Hyperlipidemia    Hypertension    no problems since losing weight after gastric sleeve surgery   Hypertension 07/17/2014   IBS (irritable bowel syndrome)    Injury of right wrist 06/26/2016   Insomnia    Migraine    Neuropathy    bilateral feet   Obesity    Obesity (BMI 30-39.9) 05/06/2017   Obesity, diabetes, and hypertension syndrome (HCC) 08/18/2019   Osteopenia    Palpitations 08/14/2014   PONV (postoperative nausea and vomiting)    Squamous cell carcinoma of skin 03/26/2021   SCC IS R mid sternum, ED[facility] 04/24/2021   Tendonitis of shoulder, right 12/25/2020    Past Surgical History:  Procedure Laterality Date   ABDOMINAL HYSTERECTOMY     no h/o abnormal pap still has 1 ovary left   ANKLE ARTHROSCOPY Left 05/29/2016   Procedure: ANKLE ARTHROSCOPY;  Surgeon: Felecia Shelling, DPM;  Location: [facility];  Service: Podiatry;  Laterality: Left;   APPENDECTOMY     basal cell removed     CESAREAN SECTION N/A    1985 & 1995   CHOLECYSTECTOMY     CLOSED MANIPULATION SHOULDER WITH STERIOD INJECTION Right 03/31/2022   Procedure: MANIPULATION UNDER ANESTHESIA WITH GLENOHUMERAL AND SUBACROMIAL CORTICOSTERIOD INJECTION;  Surgeon: Signa Kell, MD;  Location: [facility] ORS;  Service: Orthopedics;  Laterality: Right;   COLONOSCOPY WITH PROPOFOL N/A 01/31/2019   Procedure: COLONOSCOPY WITH  PROPOFOL;  Surgeon: Toney Reil, MD;  Location: [facility] ENDOSCOPY;  Service: Gastroenterology;  Laterality: N/A;   cyst removed from wrist     ESOPHAGOGASTRODUODENOSCOPY (EGD) WITH PROPOFOL N/A 01/31/2019   Procedure: ESOPHAGOGASTRODUODENOSCOPY (EGD) WITH PROPOFOL;  Surgeon: Toney Reil, MD;  Location: [facility] ENDOSCOPY;  Service: Gastroenterology;  Laterality: N/A;   ESOPHAGOGASTRODUODENOSCOPY (EGD) WITH PROPOFOL N/A 05/20/2022   Procedure: ESOPHAGOGASTRODUODENOSCOPY (EGD) WITH PROPOFOL;  Surgeon: Toney Reil, MD;  Location: [facility] ENDOSCOPY;  Service: Gastroenterology;  Laterality: N/A;   FLEXIBLE SIGMOIDOSCOPY N/A 05/21/2020   Procedure: FLEXIBLE SIGMOIDOSCOPY;  Surgeon: Toney Reil, MD;  Location: [facility] ENDOSCOPY;  Service: Gastroenterology;  Laterality: N/A;   FOOT SURGERY     left foot 11/2016 Dr. Vinnie Level    left elbow surgery     07/2017 Dr Erin Sons epicondylitis    LYSIS OF ADHESION Right 08/27/2021   Procedure: Right shoulder arthroscopic lysis of adhesions, capsular release, and manipulation under  anesthesia with corticosteroid injection and biceps tenodesis;  Surgeon: Signa Kell, MD;  Location: [facility];  Service: Orthopedics;  Laterality: Right;   MOHS SURGERY     x 2 face nose and left cheek    OSTEOCHONDROMA EXCISION Left 05/29/2016   Procedure: OSTEOCHONDRAL DRILLING TALUS;  Surgeon: Felecia Shelling, DPM;  Location: [facility];  Service: Podiatry;  Laterality: Left;   PLANTAR FASCIA RELEASE Left 05/29/2016   Procedure: ENDOSCOPIC PLANTAR FASCIOTOMY;  Surgeon: Felecia Shelling, DPM;  Location: [facility];  Service: Podiatry;  Laterality: Left;   ROTATOR CUFF REPAIR Left    SHOULDER ARTHROSCOPY WITH BICEPSTENOTOMY Right 03/31/2022   Procedure: SHOULDER ARTHROSCOPY WITH BICEPS TENOTOMY, ARTHROSCOPIC LYSIS OF ADHESIONS, AND CAPSULAR RELEASE, AND EXTENSIVE GLENOHUMERAL DEBRIDEMENT;  Surgeon: Signa Kell, MD;  Location: [facility] ORS;  Service: Orthopedics;   Laterality: Right;   SHOULDER ARTHROSCOPY WITH ROTATOR CUFF REPAIR  03/31/2022   Procedure: SHOULDER ARTHROSCOPY WITH ROTATOR CUFF REPAIR;  Surgeon: Signa Kell, MD;  Location: [facility] ORS;  Service: Orthopedics;;   STOMACH SURGERY  2017   gastric sleeve surgery   TENNIS ELBOW RELEASE/NIRSCHEL PROCEDURE Left 08/05/2017   Procedure: TENNIS ELBOW RELEASE/NIRSCHEL PROCEDURE;  Surgeon: Erin Sons, MD;  Location: [facility] ORS;  Service: Orthopedics;  Laterality: Left;   TUBAL LIGATION     wedge resection of ovary  1982    Family Psychiatric History: I have reviewed family psychiatric history from progress note on 04/03/2022.  Family History:  Family History  Problem Relation Age of Onset   Heart disease Mother    Breast cancer Mother 27       metastatic, now hip, spine, and leg    Arthritis Mother    Hyperlipidemia Mother    Cancer Mother        spinal surgery. breast dx'ed age 35 now metastatic 67 as of 07/13/18   Cystic fibrosis Mother        carrier    Heart disease Father    Lung cancer Father        smoker   Hyperlipidemia Father    Hypertension Father    Depression Sister    Diabetes Sister    Stroke Sister    Other Sister        covid +   Diabetes Sister    Diabetes Maternal Aunt    Alcohol abuse Maternal Aunt    Diabetes Maternal Aunt    Alcohol abuse Maternal Uncle    Diabetes Maternal Uncle    Alcohol abuse Paternal Uncle    Diabetes Maternal Grandfather    Hyperlipidemia Maternal Grandfather    Arthritis Maternal Grandmother    Sudden death Cousin    Schizophrenia Cousin    Cystic fibrosis Other        great niece    Factor V Leiden deficiency Other        uncle and cousins and m GF (all maternal)   Alcohol abuse Niece    Alcohol abuse Nephew    ADD / ADHD Son    Depression Son    ADD / ADHD Son    Post-traumatic stress disorder Son    Colon cancer Neg Hx    Ovarian cancer Neg Hx     Social History: I have reviewed social history from progress note on  04/03/2022. Social History   Socioeconomic History   Marital status: Married    Spouse name: Not on file   Number of children: 3   Years of education: Not on  file   Highest education level: Some college, no degree  Occupational History   Not on file  Tobacco Use   Smoking status: Never   Smokeless tobacco: Never  Vaping Use   Vaping Use: Never used  Substance and Sexual Activity   Alcohol use: Never   Drug use: Never   Sexual activity: Yes    Partners: Male    Birth control/protection: Surgical  Other Topics Concern   Not on file  Social History Narrative   Married x 37 years as of 10/2017    Kids 3 boys youngest is Renae Fickle   Write childrens books    Retired from [employer] 01/2017    Enjoys traveling    Right handed   Caffeine: quit 2009   Social Determinants of Health   Financial Resource Strain: Low Risk  (02/06/2021)   Overall Financial Resource Strain (CARDIA)    Difficulty of Paying Living Expenses: Not hard at all  Food Insecurity: Not on file  Transportation Needs: Not on file  Physical Activity: Inactive (02/10/2017)   Exercise Vital Sign    Days of Exercise per Week: 0 days    Minutes of Exercise per Session: 0 min  Stress: Not on file  Social Connections: Not on file    Allergies:  Allergies  Allergen Reactions   Tetracyclines & Related Swelling and Rash    FACE SWELL, RASH ON CHEST   Amoxicillin    Alcohol Rash    ABDOMINAL CRAMPS, drinking alcohol   Azithromycin Rash   Metoprolol Diarrhea    Metabolic Disorder Labs: Lab Results  Component Value Date   HGBA1C 5.4 01/09/2022   No results found for: "PROLACTIN" Lab Results  Component Value Date   CHOL 136 01/09/2022   TRIG 121.0 01/09/2022   HDL 58.50 01/09/2022   CHOLHDL 2 01/09/2022   VLDL 24.2 01/09/2022   LDLCALC 53 01/09/2022   LDLCALC 69 04/23/2021   Lab Results  Component Value Date   TSH 2.37 01/09/2022   TSH 2.67 04/23/2021    Therapeutic Level Labs: No results found for:  "LITHIUM" No results found for: "VALPROATE" No results found for: "CBMZ"  Current Medications: Current Outpatient Medications  Medication Sig Dispense Refill   acetaminophen (TYLENOL) 500 MG tablet Take 2 tablets (1,000 mg total) by mouth every 8 (eight) hours. 90 tablet 2   busPIRone (BUSPAR) 10 MG tablet Take 1 tablet (10 mg total) by mouth 2 (two) times daily. 60 tablet 1   citalopram (CELEXA) 40 MG tablet Take 1 tablet (40 mg total) by mouth daily. 90 tablet 0   Cyanocobalamin (VITAMIN B-12) 1000 MCG SUBL Place 1 tablet (1,000 mcg total) under the tongue daily. 90 tablet 3   fexofenadine (ALLEGRA) 180 MG tablet Take 1 tablet (180 mg total) by mouth daily as needed for allergies or rhinitis. 90 tablet 3   gabapentin (NEURONTIN) 300 MG capsule TAKE 1 CAPSULE BY MOUTH 3 TIMES DAILY AS NEEDED. 90 capsule 5   hydrOXYzine (ATARAX) 25 MG tablet Take 0.5-1 tablets (12.5-25 mg total) by mouth 3 (three) times daily as needed for anxiety (and sleep). 90 tablet 1   montelukast (SINGULAIR) 10 MG tablet Take 1 tablet (10 mg total) by mouth at bedtime. 90 tablet 3   omeprazole (PRILOSEC) 40 MG capsule TAKE 1 CAPSULE (40 MG TOTAL) BY MOUTH 2 (TWO) TIMES DAILY BEFORE A MEAL. 180 capsule 0   ondansetron (ZOFRAN-ODT) 4 MG disintegrating tablet Take 1 tablet (4 mg total) by mouth every 8 (  eight) hours as needed. for nausea 20 tablet 2   Rimegepant Sulfate (NURTEC) 75 MG TBDP Take 1 tablet (75 mg total) by mouth daily as needed (take for abortive therapy of migraine, no more than 1 tablet in 24 hours or 10 per month). 10 tablet 2   rosuvastatin (CRESTOR) 20 MG tablet TAKE 1 TABLET BY MOUTH EVERY DAY 90 tablet 2   tirzepatide (MOUNJARO) 2.5 MG/0.5ML Pen INJECT 2.5 MG SUBCUTANEOUSLY WEEKLY 6 mL 1   topiramate (TOPAMAX) 50 MG tablet Take 1 tablet (50 mg total) by mouth daily. 90 tablet 3   Vitamin D, Ergocalciferol, (DRISDOL) 1.25 MG (50000 UNIT) CAPS capsule Take 1 capsule (50,000 Units total) by mouth every 7  (seven) days. 12 capsule 0   zolpidem (AMBIEN CR) 12.5 MG CR tablet Take 1 tablet (12.5 mg total) by mouth at bedtime as needed. 30 tablet 5   Calcium Citrate-Vitamin D 500-10 MG-MCG CHEW Take 1 tablet two times a day (Patient not taking: Reported on 05/21/2022)     celecoxib (CELEBREX) 200 MG capsule Take by mouth. (Patient not taking: Reported on 05/21/2022)     No current facility-administered medications for this visit.     Musculoskeletal: Strength & Muscle Tone:  UTA Gait & Station:  Seated Patient leans: N/A  Psychiatric Specialty Exam: Review of Systems  Musculoskeletal:  Positive for neck pain.  Psychiatric/Behavioral:  Positive for decreased concentration, dysphoric mood and sleep disturbance. The patient is nervous/anxious.   All other systems reviewed and are negative.   There were no vitals taken for this visit.There is no height or weight on file to calculate BMI.  General Appearance: Casual  Eye Contact:  Fair  Speech:  Clear and Coherent  Volume:  Normal  Mood:  Anxious and Depressed  Affect:  Congruent  Thought Process:  Goal Directed and Descriptions of Associations: Intact  Orientation:  Full (Time, Place, and Person)  Thought Content: Logical   Suicidal Thoughts:  No  Homicidal Thoughts:  No  Memory:  Immediate;   Fair Recent;   Fair Remote;   Fair  Judgement:  Fair  Insight:  Good  Psychomotor Activity:  Normal  Concentration:  Concentration: Fair and Attention Span: Fair  Recall:  [employer] of Knowledge: Fair  Language: Fair  Akathisia:  No  Handed:  Right  AIMS (if indicated): not done  Assets:  Communication Skills Desire for Improvement Housing Social Support  ADL's:  Intact  Cognition: WNL  Sleep:  Poor   Screenings: GAD-7    Loss adjuster, chartered Office Visit from 04/03/2022 in [facility] Office Visit from 03/03/2022 in [facility] [employer] Visit from 02/13/2022 in  [facility] [employer] Video Visit from 01/27/2022 in [facility] [employer] Visit from 01/09/2022 in [facility] [employer]  Total GAD-7 Score 18 16 17 19 17       [employer]    Flowsheet Row Office Visit from 04/03/2022 in [facility] Office Visit from 03/03/2022 in [facility] [employer] Visit from 02/13/2022 in [facility] [employer] Video Visit from 01/27/2022 in [facility] [address] [facility] [employer] Visit from 01/09/2022 in [facility] [employer] Total Score 4 4 4 5 4   PHQ-9 Total Score 21 15 14 21 18       Flowsheet  Row Video Visit from 05/21/2022 in [facility] Admission (Discharged) from 05/20/2022 in [facility] Office Visit from 04/03/2022 in [facility]  C-SSRS RISK CATEGORY No Risk No Risk No Risk        Assessment and Plan: Kristin [name] is a 62 year old Caucasian female, married, retired, lives in [place], has a history of anxiety, depression, multiple medical problems, chronic pain, current neck pain, was evaluated by telemedicine today.  Patient currently continues to struggle with mood symptoms, sleep problems mostly due to her neck pain, will benefit from medication management and psychotherapy sessions.  Plan as noted below.  Plan GAD-some improvement Increase Celexa to 40 mg p.o. daily.  Patient could take it in divided dosage. BuSpar 10 mg p.o. twice daily.  Patient reports using it only as needed.  Patient advised to use it twice a day. Hydroxyzine 12.5-25 mg p.o. twice daily as needed for severe anxiety attacks only. Patient has been referred for CBT-patient has upcoming appointment with Ms. Felecia Jan.  MDD-unstable Increase Celexa to 40 mg p.o. daily Patient to start CBT with Ms. Felecia Jan  Insomnia-unstable likely due to pain She will need sufficient pain management Continue zolpidem extended release 12.5 mg p.o. nightly for now Reviewed [place] PMP AWARxE  Attention deficit-unstable Patient had testing done at [facility]-I have reviewed the testing report-per report patient's attention struggle seems to be due to other comorbidities rather than typical neurodevelopmental ADHD.  She has OCD, social anxiety tendencies and since retiring her anxiety has gotten worse.  She also has sensory processing disorder with auditory sensitivity.  Once anxiety is better managed we will consider reevaluating patient for persistent ADHD symptoms.  Will not recommend a trial of stimulant with the level of anxiety she has at this time.  High risk medication use-sodium level ordered-pending.  Will coordinate care with Ms. Felecia Jan.  Follow-up in clinic in 8 weeks or sooner if needed.  Collaboration of Care: Collaboration of Care: Referral or follow-up with counselor/therapist AEB patient to follow-up with therapist, patient will need sufficient pain management advised to follow up with her providers for the same.  Patient/Guardian was advised Release of Information must be obtained prior to any record release in order to collaborate their care with an outside provider. Patient/Guardian was advised if they have not already done so to contact the registration department to sign all necessary forms in order for [place] to release information regarding their care.   Consent: Patient/Guardian gives verbal consent for treatment and assignment of benefits for services provided during this visit. Patient/Guardian expressed understanding and agreed to proceed.   This note was generated in part or whole with voice recognition software. Voice recognition is usually quite accurate but  there are transcription errors that can and very often do occur. I apologize for any typographical errors that were not detected and corrected.    Jomarie Longs, MD 05/21/2022, 5:17 PM

## 2022-05-22 ENCOUNTER — Telehealth: Payer: Self-pay

## 2022-05-22 DIAGNOSIS — F901 Attention-deficit hyperactivity disorder, predominantly hyperactive type: Secondary | ICD-10-CM | POA: Diagnosis not present

## 2022-05-22 DIAGNOSIS — R1319 Other dysphagia: Secondary | ICD-10-CM

## 2022-05-22 DIAGNOSIS — F411 Generalized anxiety disorder: Secondary | ICD-10-CM | POA: Diagnosis not present

## 2022-05-22 DIAGNOSIS — F331 Major depressive disorder, recurrent, moderate: Secondary | ICD-10-CM | POA: Diagnosis not present

## 2022-05-22 NOTE — Telephone Encounter (Signed)
Patient verablized understanding of results. Patient is okay with the esophageal manometry test. Placed referral to [facility] for this Faxed referral to [phone] got confirmation that fax went though

## 2022-05-22 NOTE — Telephone Encounter (Signed)
-----   Message from Toney Reil, MD sent at 05/22/2022  2:08 PM EDT ----- Her EGD came back unremarkable.  Please refer her to [facility] for esophageal manometry.  If this is unremarkable, will refer her to surgery for hiatal hernia repair.  Continue Prilosec 40 mg twice daily before meals  RV

## 2022-05-27 ENCOUNTER — Encounter: Payer: Self-pay | Admitting: Gastroenterology

## 2022-05-29 MED FILL — Tirzepatide Soln Auto-injector 2.5 MG/0.5ML: SUBCUTANEOUS | 28 days supply | Qty: 2 | Fill #2 | Status: AC

## 2022-06-02 DIAGNOSIS — K219 Gastro-esophageal reflux disease without esophagitis: Secondary | ICD-10-CM | POA: Diagnosis not present

## 2022-06-02 DIAGNOSIS — K2289 Other specified disease of esophagus: Secondary | ICD-10-CM | POA: Diagnosis not present

## 2022-06-02 DIAGNOSIS — R1310 Dysphagia, unspecified: Secondary | ICD-10-CM | POA: Diagnosis not present

## 2022-06-02 DIAGNOSIS — R1314 Dysphagia, pharyngoesophageal phase: Secondary | ICD-10-CM | POA: Diagnosis not present

## 2022-06-02 DIAGNOSIS — Z01818 Encounter for other preprocedural examination: Secondary | ICD-10-CM | POA: Diagnosis not present

## 2022-06-02 DIAGNOSIS — K449 Diaphragmatic hernia without obstruction or gangrene: Secondary | ICD-10-CM | POA: Diagnosis not present

## 2022-06-13 ENCOUNTER — Other Ambulatory Visit: Payer: Self-pay | Admitting: Psychiatry

## 2022-06-13 DIAGNOSIS — G4700 Insomnia, unspecified: Secondary | ICD-10-CM

## 2022-06-13 DIAGNOSIS — F411 Generalized anxiety disorder: Secondary | ICD-10-CM

## 2022-06-14 ENCOUNTER — Other Ambulatory Visit: Payer: Self-pay | Admitting: Family Medicine

## 2022-06-14 DIAGNOSIS — F411 Generalized anxiety disorder: Secondary | ICD-10-CM

## 2022-06-17 ENCOUNTER — Encounter: Payer: Self-pay | Admitting: Gastroenterology

## 2022-06-20 ENCOUNTER — Other Ambulatory Visit: Payer: Self-pay | Admitting: Family Medicine

## 2022-06-20 DIAGNOSIS — G4700 Insomnia, unspecified: Secondary | ICD-10-CM

## 2022-06-20 MED ORDER — ZOLPIDEM TARTRATE ER 6.25 MG PO TBCR: 12.5000 mg | EXTENDED_RELEASE_TABLET | Freq: Every evening | ORAL | 0 refills | Status: DC | PRN

## 2022-06-20 MED ORDER — ZOLPIDEM TARTRATE ER 12.5 MG PO TBCR
12.5000 mg | EXTENDED_RELEASE_TABLET | Freq: Every evening | ORAL | 0 refills | Status: DC | PRN
Start: 2022-06-20 — End: 2022-06-20

## 2022-06-20 NOTE — Telephone Encounter (Signed)
See dosing request

## 2022-06-20 NOTE — Addendum Note (Signed)
Addended by: Warden Fillers on: 06/20/2022 10:55 AM   Modules accepted: Orders

## 2022-06-20 NOTE — Telephone Encounter (Signed)
Prescription Request  06/20/2022  LOV: 03/03/2022  What is the name of the medication or equipment? zolpidem (AMBIEN CR) 12.5 MG CR tablet. Patient has refills, But pharmacy only has 10mg . New prescription is needed for 10mg . Patient is going back and forth from [place] to [place], patient's mother lives in [place] and has cancer. Patient will be in [place] next week for 2 week and back to [place] for 6 weeks. IS there a way to have a 52m refill?    Have you contacted your pharmacy to request a refill? Yes   Which pharmacy would you like this sent to?  [id number] [place], [place] - [address] [place] [address] Phone: [phone] Fax: [phone]    Patient notified that their request is being sent to the clinical staff for review and that they should receive a response within 2 business days.   Please advise at Mobile [phone] (mobile)

## 2022-06-23 ENCOUNTER — Encounter: Payer: Self-pay | Admitting: Family Medicine

## 2022-06-25 DIAGNOSIS — F331 Major depressive disorder, recurrent, moderate: Secondary | ICD-10-CM | POA: Diagnosis not present

## 2022-06-25 DIAGNOSIS — F411 Generalized anxiety disorder: Secondary | ICD-10-CM | POA: Diagnosis not present

## 2022-06-25 DIAGNOSIS — F901 Attention-deficit hyperactivity disorder, predominantly hyperactive type: Secondary | ICD-10-CM | POA: Diagnosis not present

## 2022-06-26 ENCOUNTER — Ambulatory Visit: Payer: BC Managed Care – PPO | Admitting: Dermatology

## 2022-06-26 VITALS — BP 99/61 | HR 65

## 2022-06-26 DIAGNOSIS — L814 Other melanin hyperpigmentation: Secondary | ICD-10-CM

## 2022-06-26 DIAGNOSIS — Z85828 Personal history of other malignant neoplasm of skin: Secondary | ICD-10-CM

## 2022-06-26 DIAGNOSIS — D1801 Hemangioma of skin and subcutaneous tissue: Secondary | ICD-10-CM

## 2022-06-26 DIAGNOSIS — L578 Other skin changes due to chronic exposure to nonionizing radiation: Secondary | ICD-10-CM

## 2022-06-26 DIAGNOSIS — Z1283 Encounter for screening for malignant neoplasm of skin: Secondary | ICD-10-CM | POA: Diagnosis not present

## 2022-06-26 DIAGNOSIS — X32XXXA Exposure to sunlight, initial encounter: Secondary | ICD-10-CM

## 2022-06-26 DIAGNOSIS — W908XXA Exposure to other nonionizing radiation, initial encounter: Secondary | ICD-10-CM | POA: Diagnosis not present

## 2022-06-26 DIAGNOSIS — L821 Other seborrheic keratosis: Secondary | ICD-10-CM

## 2022-06-26 DIAGNOSIS — L570 Actinic keratosis: Secondary | ICD-10-CM | POA: Diagnosis not present

## 2022-06-26 NOTE — Progress Notes (Signed)
Follow-Up Visit   Subjective  Kristin [name] is a 62 y.o. female who presents for the following: Skin Cancer Screening and Full Body Skin Exam  The patient presents for Total-Body Skin Exam (TBSE) for skin cancer screening and mole check. The patient has spots, moles and lesions to be evaluated, some may be new or changing and the patient has concerns that these could be cancer.    The following portions of the chart were reviewed this encounter and updated as appropriate: medications, allergies, medical history  Review of Systems:  No other skin or systemic complaints except as noted in HPI or Assessment and Plan.  Objective  Well appearing patient in no apparent distress; mood and affect are within normal limits.  A full examination was performed including scalp, head, eyes, ears, nose, lips, neck, chest, axillae, abdomen, back, buttocks, bilateral upper extremities, bilateral lower extremities, hands, feet, fingers, toes, fingernails, and toenails. All findings within normal limits unless otherwise noted below.   Relevant physical exam findings are noted in the Assessment and Plan.  L ear upper helix x 1, R wrist x 1 Erythematous thin papules/macules with gritty scale.     Assessment & Plan   LENTIGINES, SEBORRHEIC KERATOSES, HEMANGIOMAS - Benign normal skin lesions - Benign-appearing - Call for any changes  MELANOCYTIC NEVI - Tan-brown and/or pink-flesh-colored symmetric macules and papules - Benign appearing on exam today - Observation - Call clinic for new or changing moles - Recommend daily use of broad spectrum spf 30+ sunscreen to sun-exposed areas.   ACTINIC DAMAGE - Chronic condition, secondary to cumulative UV/sun exposure - diffuse scaly erythematous macules with underlying dyspigmentation - Recommend daily broad spectrum sunscreen SPF 30+ to sun-exposed areas, reapply every 2 hours as needed.  - Staying in the shade or wearing long sleeves, sun glasses  (UVA+UVB protection) and wide brim hats (4-inch brim around the entire circumference of the hat) are also recommended for sun protection.  - Call for new or changing lesions.  HISTORY OF BASAL CELL CARCINOMA OF THE SKIN - No evidence of recurrence today - Recommend regular full body skin exams - Recommend daily broad spectrum sunscreen SPF 30+ to sun-exposed areas, reapply every 2 hours as needed.  - Call if any new or changing lesions are noted between office visits  HISTORY OF SQUAMOUS CELL CARCINOMA OF THE SKIN - No evidence of recurrence today - Recommend regular full body skin exams - Recommend daily broad spectrum sunscreen SPF 30+ to sun-exposed areas, reapply every 2 hours as needed.  - Call if any new or changing lesions are noted between office visits  SEBORRHEIC KERATOSIS - R zygoma  - Stuck-on, waxy, tan-brown papules and/or plaques  - Benign-appearing - Discussed benign etiology and prognosis. - Observe - Call for any changes  AK (actinic keratosis) L ear upper helix x 1, R wrist x 1  Actinic keratoses are precancerous spots that appear secondary to cumulative UV radiation exposure/sun exposure over time. They are chronic with expected duration over 1 year. A portion of actinic keratoses will progress to squamous cell carcinoma of the skin. It is not possible to reliably predict which spots will progress to skin cancer and so treatment is recommended to prevent development of skin cancer.  Recommend daily broad spectrum sunscreen SPF 30+ to sun-exposed areas, reapply every 2 hours as needed.  Recommend staying in the shade or wearing long sleeves, sun glasses (UVA+UVB protection) and wide brim hats (4-inch brim around the entire circumference of  the hat). Call for new or changing lesions.   Destruction of lesion - L ear upper helix x 1, R wrist x 1  Destruction method: cryotherapy   Informed consent: discussed and consent obtained   Timeout:  patient name, date of  birth, surgical site, and procedure verified Lesion destroyed using liquid nitrogen: Yes   Region frozen until ice ball extended beyond lesion: Yes   Outcome: patient tolerated procedure well with no complications   Post-procedure details: wound care instructions given   Additional details:  Prior to procedure, discussed risks of blister formation, small wound, skin dyspigmentation, or rare scar following cryotherapy. Recommend Vaseline ointment to treated areas while healing.   SKIN CANCER SCREENING PERFORMED TODAY.  Return in about 1 year (around 06/26/2023) for TBSE.  Maylene Roes, CMA, am acting as scribe for Willeen Niece, MD .  Documentation: I have reviewed the above documentation for accuracy and completeness, and I agree with the above.  Willeen Niece, MD

## 2022-06-26 NOTE — Patient Instructions (Signed)
Due to recent changes in healthcare laws, you may see results of your pathology and/or laboratory studies on MyChart before the doctors have had a chance to review them. We understand that in some cases there may be results that are confusing or concerning to you. Please understand that not all results are received at the same time and often the doctors may need to interpret multiple results in order to provide you with the best plan of care or course of treatment. Therefore, we ask that you please give us 2 business days to thoroughly review all your results before contacting the office for clarification. Should we see a critical lab result, you will be contacted sooner.   If You Need Anything After Your Visit  If you have any questions or concerns for your doctor, please call our main line at [phone] and press option 4 to reach your doctor's medical assistant. If no one answers, please leave a voicemail as directed and we will return your call as soon as possible. Messages left after 4 pm will be answered the following business day.   You may also send us a message via MyChart. We typically respond to MyChart messages within 1-2 business days.  For prescription refills, please ask your pharmacy to contact our office. Our fax number is [phone].  If you have an urgent issue when the clinic is closed that cannot wait until the next business day, you can page your doctor at the number below.    Please note that while we do our best to be available for urgent issues outside of office hours, we are not available 24/7.   If you have an urgent issue and are unable to reach us, you may choose to seek medical care at your doctor's office, retail clinic, urgent care center, or emergency room.  If you have a medical emergency, please immediately call 911 or go to the emergency department.  Pager Numbers  - Dr. Kowalski: [phone]  - Dr. Moye: [phone]  - Dr. Stewart:  [phone]  In the event of inclement weather, please call our main line at [phone] for an update on the status of any delays or closures.  Dermatology Medication Tips: Please keep the boxes that topical medications come in in order to help keep track of the instructions about where and how to use these. Pharmacies typically print the medication instructions only on the boxes and not directly on the medication tubes.   If your medication is too expensive, please contact our office at [phone] option 4 or send us a message through MyChart.   We are unable to tell what your co-pay for medications will be in advance as this is different depending on your insurance coverage. However, we may be able to find a substitute medication at lower cost or fill out paperwork to get insurance to cover a needed medication.   If a prior authorization is required to get your medication covered by your insurance company, please allow us 1-2 business days to complete this process.  Drug prices often vary depending on where the prescription is filled and some pharmacies may offer cheaper prices.  The website www.goodrx.com contains coupons for medications through different pharmacies. The prices here do not account for what the cost may be with help from insurance (it may be cheaper with your insurance), but the website can give you the price if you did not use any insurance.  - You can print the associated coupon and take it with   your prescription to the pharmacy.  - You may also stop by our office during regular business hours and pick up a GoodRx coupon card.  - If you need your prescription sent electronically to a different pharmacy, notify our office through [place] MyChart or by phone at [phone] option 4.     Si Usted Necesita Algo Despus de Su Visita  Tambin puede enviarnos un mensaje a travs de MyChart. Por lo general respondemos a los mensajes de MyChart en el transcurso de 1 a 2  das hbiles.  Para renovar recetas, por favor pida a su farmacia que se ponga en contacto con nuestra oficina. Nuestro nmero de fax es el [phone].  Si tiene un asunto urgente cuando la clnica est cerrada y que no puede esperar hasta el siguiente da hbil, puede llamar/localizar a su doctor(a) al nmero que aparece a continuacin.   Por favor, tenga en cuenta que aunque hacemos todo lo posible para estar disponibles para asuntos urgentes fuera del horario de oficina, no estamos disponibles las 24 horas del da, los 7 das de la semana.   Si tiene un problema urgente y no puede comunicarse con nosotros, puede optar por buscar atencin mdica  en el consultorio de su doctor(a), en una clnica privada, en un centro de atencin urgente o en una sala de emergencias.  Si tiene una emergencia mdica, por favor llame inmediatamente al 911 o vaya a la sala de emergencias.  Nmeros de bper  - Dr. Kowalski: [phone]  - Dra. Moye: [phone]  - Dra. Stewart: [phone]  En caso de inclemencias del tiempo, por favor llame a nuestra lnea principal al [phone] para una actualizacin sobre el estado de cualquier retraso o cierre.  Consejos para la medicacin en dermatologa: Por favor, guarde las cajas en las que vienen los medicamentos de uso tpico para ayudarle a seguir las instrucciones sobre dnde y cmo usarlos. Las farmacias generalmente imprimen las instrucciones del medicamento slo en las cajas y no directamente en los tubos del medicamento.   Si su medicamento es muy caro, por favor, pngase en contacto con nuestra oficina llamando al [phone] y presione la opcin 4 o envenos un mensaje a travs de MyChart.   No podemos decirle cul ser su copago por los medicamentos por adelantado ya que esto es diferente dependiendo de la cobertura de su seguro. Sin embargo, es posible que podamos encontrar un medicamento sustituto a menor costo o llenar un formulario para que el  seguro cubra el medicamento que se considera necesario.   Si se requiere una autorizacin previa para que su compaa de seguros cubra su medicamento, por favor permtanos de 1 a 2 das hbiles para completar este proceso.  Los precios de los medicamentos varan con frecuencia dependiendo del lugar de dnde se surte la receta y alguna farmacias pueden ofrecer precios ms baratos.  El sitio web www.goodrx.com tiene cupones para medicamentos de diferentes farmacias. Los precios aqu no tienen en cuenta lo que podra costar con la ayuda del seguro (puede ser ms barato con su seguro), pero el sitio web puede darle el precio si no utiliz ningn seguro.  - Puede imprimir el cupn correspondiente y llevarlo con su receta a la farmacia.  - Tambin puede pasar por nuestra oficina durante el horario de atencin regular y recoger una tarjeta de cupones de GoodRx.  - Si necesita que su receta se enve electrnicamente a una farmacia diferente, informe a nuestra oficina a travs de MyChart de [place]   o por telfono llamando al [phone] y presione la opcin 4.  

## 2022-06-30 MED FILL — Tirzepatide Soln Auto-injector 2.5 MG/0.5ML: SUBCUTANEOUS | 28 days supply | Qty: 2 | Fill #3 | Status: AC

## 2022-07-02 ENCOUNTER — Encounter: Payer: Self-pay | Admitting: Family Medicine

## 2022-07-02 ENCOUNTER — Ambulatory Visit (INDEPENDENT_AMBULATORY_CARE_PROVIDER_SITE_OTHER): Payer: BC Managed Care – PPO | Admitting: Family Medicine

## 2022-07-02 VITALS — BP 120/74 | HR 60 | Temp 97.4°F | Ht 62.0 in | Wt 113.8 lb

## 2022-07-02 DIAGNOSIS — G4700 Insomnia, unspecified: Secondary | ICD-10-CM

## 2022-07-02 DIAGNOSIS — E559 Vitamin D deficiency, unspecified: Secondary | ICD-10-CM | POA: Diagnosis not present

## 2022-07-02 DIAGNOSIS — F39 Unspecified mood [affective] disorder: Secondary | ICD-10-CM

## 2022-07-02 DIAGNOSIS — E538 Deficiency of other specified B group vitamins: Secondary | ICD-10-CM | POA: Diagnosis not present

## 2022-07-02 DIAGNOSIS — E1169 Type 2 diabetes mellitus with other specified complication: Secondary | ICD-10-CM

## 2022-07-02 DIAGNOSIS — E1159 Type 2 diabetes mellitus with other circulatory complications: Secondary | ICD-10-CM

## 2022-07-02 DIAGNOSIS — F32A Depression, unspecified: Secondary | ICD-10-CM

## 2022-07-02 DIAGNOSIS — F411 Generalized anxiety disorder: Secondary | ICD-10-CM

## 2022-07-02 DIAGNOSIS — Z7985 Long-term (current) use of injectable non-insulin antidiabetic drugs: Secondary | ICD-10-CM

## 2022-07-02 DIAGNOSIS — I152 Hypertension secondary to endocrine disorders: Secondary | ICD-10-CM

## 2022-07-02 DIAGNOSIS — E119 Type 2 diabetes mellitus without complications: Secondary | ICD-10-CM

## 2022-07-02 DIAGNOSIS — E118 Type 2 diabetes mellitus with unspecified complications: Secondary | ICD-10-CM

## 2022-07-02 DIAGNOSIS — E785 Hyperlipidemia, unspecified: Secondary | ICD-10-CM

## 2022-07-02 MED ORDER — ROSUVASTATIN CALCIUM 20 MG PO TABS: 20.0000 mg | ORAL_TABLET | Freq: Every day | ORAL | 2 refills | Status: DC

## 2022-07-02 MED ORDER — ZOLPIDEM TARTRATE ER 6.25 MG PO TBCR
12.5000 mg | EXTENDED_RELEASE_TABLET | Freq: Every evening | ORAL | 0 refills | Status: DC | PRN
Start: 2022-07-02 — End: 2022-08-18

## 2022-07-02 NOTE — Patient Instructions (Addendum)
It was a pleasure meeting you today. Thank you for allowing me to take part in your health care.  Our goals for today as we discussed include:  Schedule lab appointment.  Fast for 10 hours  Follow up in 6 months   If you have any questions or concerns, please do not hesitate to call the office at [phone].  I look forward to our next visit and until then take care and stay safe.  Regards,   Dana Allan, MD   [facility]

## 2022-07-02 NOTE — Progress Notes (Signed)
SUBJECTIVE:   Chief Complaint  Patient presents with   Medical Management of Chronic Issues   HPI Presents for follow up for mood disorder.  Mood disorder She now follows with Dr Elna Breslow.  Celexa was increased from 20 mg to 40 mg daily.  She continues to take Buspar 10 mg BID and Atarax 25-50 mg TID.     Insomnia Requesting early refill of Ambien.  Plans to be out of town until end of July.  Leaving June 21.  Reports having enough Ambien until June 23.  Discussed providing early refill and with next refill can allow for 3 month supply given frequency of travel out of town.    DM Type 2 Doing well on current medication. Recently gained weight.  Taking Mounjaro 2.5 mg weekly.  Denies any symptoms.  Does not check CBG. On statin and tolerating well/   PERTINENT PMH / PSH: Mood disorder Insomnia DM T2 HLD   OBJECTIVE:  BP 120/74   Pulse 60   Temp (!) 97.4 F (36.3 C)   Ht 5\' 2"  (1.575 m)   Wt 113 lb 12.8 oz (51.6 kg)   SpO2 98%   BMI 20.81 kg/m    Physical Exam Vitals reviewed.  Constitutional:      General: She is not in acute distress.    Appearance: Normal appearance. She is normal weight. She is not ill-appearing, toxic-appearing or diaphoretic.  Eyes:     General:        Right eye: No discharge.        Left eye: No discharge.     Conjunctiva/sclera: Conjunctivae normal.  Cardiovascular:     Rate and Rhythm: Normal rate.  Pulmonary:     Effort: Pulmonary effort is normal.  Musculoskeletal:        General: Normal range of motion.  Skin:    General: Skin is warm and dry.  Neurological:     General: No focal deficit present.     Mental Status: She is alert and oriented to person, place, and time. Mental status is at baseline.  Psychiatric:        Mood and Affect: Mood normal.        Behavior: Behavior normal.        Thought Content: Thought content normal.        Judgment: Judgment normal.       07/02/2022    2:49 PM 04/03/2022    5:32 PM 03/03/2022     2:33 PM 02/13/2022   11:03 AM 01/27/2022   11:38 AM  Depression screen PHQ 2/9  Decreased Interest 2 1 2 2 2   Down, Depressed, Hopeless 2 3 2 2 3   PHQ - 2 Score 4 4 4 4 5   Altered sleeping 2 3 3 3 3   Tired, decreased energy 1 3 3 2 3   Change in appetite 0 3 0 0 2  Feeling bad or failure about yourself  2 3 2 1 2   Trouble concentrating 1 3 2 2 3   Moving slowly or fidgety/restless 0 2 1 2 3   Suicidal thoughts 0 0 0 0 0  PHQ-9 Score 10 21 15 14 21   Difficult doing work/chores Very difficult Very difficult Very difficult Very difficult Very difficult       07/02/2022    2:49 PM 04/03/2022    5:32 PM 03/03/2022    2:33 PM 02/13/2022   11:03 AM  GAD 7 : Generalized Anxiety Score  Nervous, Anxious, on Edge 3  3 3 3   Control/stop worrying 3 3 3 3   Worry too much - different things 3 3 3 3   Trouble relaxing 2 3 2 2   Restless 0 1 1 2   Easily annoyed or irritable 1 2 1 1   Afraid - awful might happen 3 3 3 3   Total GAD 7 Score 15 18 16 17   Anxiety Difficulty Very difficult Very difficult Very difficult Very difficult      ASSESSMENT/PLAN:  Insomnia, unspecified type Assessment & Plan: Chronic.  Takes Ambien for sleep. Would like to wean in future once anxiety controlled.  Patient agreeable Refill Ambien CR 6.25 mg 2 tabs at night. Requires non opioid contract and UDS at prior to next refill. Sleep hygiene discussed Continue to follow up with psychiatry  Orders: -     Zolpidem Tartrate ER; Take 2 tablets (12.5 mg total) by mouth at bedtime as needed for sleep.  Dispense: 60 tablet; Refill: 0  Mood disorder (HCC) Assessment & Plan: Chronic Celexa was increased to 40 mg by psych Continues to take Buspar and Atarax Continue to follow up with psychiatry for continued management   Vitamin D deficiency -     VITAMIN D 25 Hydroxy (Vit-D Deficiency, Fractures); Future  Vitamin B 12 deficiency -     Vitamin B12; Future  Hyperlipidemia associated with type 2 diabetes mellitus  (HCC) -     Rosuvastatin Calcium; Take 1 tablet (20 mg total) by mouth daily.  Dispense: 90 tablet; Refill: 2 -     Lipid panel; Future  Hypertension associated with diabetes [facility]) Assessment & Plan: Chronic.  Well-controlled  without medication   Orders: -     Comprehensive metabolic panel; Future  Type 2 diabetes mellitus with complications [facility]) Assessment & Plan: Chronic.  Reports weight increased 5 pounds since last visit. Continue Mounjaro 2.5 mg weekly Continue statin BP well controlled Eye exam due Foot exam at next visit   Orders: -     Hemoglobin A1c; Future  Diabetic eye exam [facility]) -     Ambulatory referral to Ophthalmology   PDMP reviewed  Return in about 6 months (around 01/01/2023).  Dana Allan, MD

## 2022-07-03 ENCOUNTER — Encounter: Payer: Self-pay | Admitting: Family Medicine

## 2022-07-03 ENCOUNTER — Other Ambulatory Visit (INDEPENDENT_AMBULATORY_CARE_PROVIDER_SITE_OTHER): Payer: BC Managed Care – PPO

## 2022-07-03 DIAGNOSIS — E785 Hyperlipidemia, unspecified: Secondary | ICD-10-CM | POA: Diagnosis not present

## 2022-07-03 DIAGNOSIS — E538 Deficiency of other specified B group vitamins: Secondary | ICD-10-CM | POA: Diagnosis not present

## 2022-07-03 DIAGNOSIS — E1169 Type 2 diabetes mellitus with other specified complication: Secondary | ICD-10-CM | POA: Diagnosis not present

## 2022-07-03 DIAGNOSIS — E118 Type 2 diabetes mellitus with unspecified complications: Secondary | ICD-10-CM | POA: Diagnosis not present

## 2022-07-03 DIAGNOSIS — I152 Hypertension secondary to endocrine disorders: Secondary | ICD-10-CM

## 2022-07-03 DIAGNOSIS — E559 Vitamin D deficiency, unspecified: Secondary | ICD-10-CM

## 2022-07-03 DIAGNOSIS — E1159 Type 2 diabetes mellitus with other circulatory complications: Secondary | ICD-10-CM

## 2022-07-03 LAB — COMPREHENSIVE METABOLIC PANEL
ALT: 18 U/L (ref 0–35)
AST: 16 U/L (ref 0–37)
Albumin: 4.1 g/dL (ref 3.5–5.2)
Alkaline Phosphatase: 66 U/L (ref 39–117)
BUN: 19 mg/dL (ref 6–23)
CO2: 28 mEq/L (ref 19–32)
Calcium: 9 mg/dL (ref 8.4–10.5)
Chloride: 106 mEq/L (ref 96–112)
Creatinine, Ser: 0.89 mg/dL (ref 0.40–1.20)
GFR: 69.4 mL/min (ref >60.00)
Glucose, Bld: 79 mg/dL (ref 70–99)
Potassium: 3.8 mEq/L (ref 3.5–5.1)
Sodium: 140 mEq/L (ref 135–145)
Total Bilirubin: 0.5 mg/dL (ref 0.2–1.2)
Total Protein: 6.7 g/dL (ref 6.0–8.3)

## 2022-07-03 LAB — LIPID PANEL
Cholesterol: 151 mg/dL (ref 0–200)
HDL: 59.2 mg/dL (ref >39.00)
LDL Cholesterol: 77 mg/dL (ref 0–99)
NonHDL: 91.46
Total CHOL/HDL Ratio: 3
Triglycerides: 70 mg/dL (ref 0.0–149.0)
VLDL: 14 mg/dL (ref 0.0–40.0)

## 2022-07-03 LAB — VITAMIN D 25 HYDROXY (VIT D DEFICIENCY, FRACTURES): VITD: 40.48 ng/mL (ref 30.00–100.00)

## 2022-07-03 LAB — HEMOGLOBIN A1C: Hgb A1c MFr Bld: 5.2 % (ref 4.6–6.5)

## 2022-07-03 LAB — VITAMIN B12: Vitamin B-12: 419 pg/mL (ref 211–911)

## 2022-07-04 DIAGNOSIS — F901 Attention-deficit hyperactivity disorder, predominantly hyperactive type: Secondary | ICD-10-CM | POA: Diagnosis not present

## 2022-07-04 DIAGNOSIS — F411 Generalized anxiety disorder: Secondary | ICD-10-CM | POA: Diagnosis not present

## 2022-07-04 DIAGNOSIS — F331 Major depressive disorder, recurrent, moderate: Secondary | ICD-10-CM | POA: Diagnosis not present

## 2022-07-07 ENCOUNTER — Other Ambulatory Visit: Payer: Self-pay | Admitting: Gastroenterology

## 2022-07-07 NOTE — Telephone Encounter (Signed)
Last office visit 04/30/2022 Plan:  Patient had upper endoscopy in 2021 which was unremarkable Recommend repeat upper endoscopy with proximal and distal esophageal biopsies separately If this is unremarkable, recommend esophageal manometry to evaluate for dysmotility/achalasia Continue omeprazole 40 mg once a day at this time Continue soft diet only Check food allergy profile

## 2022-07-09 ENCOUNTER — Telehealth: Payer: Self-pay | Admitting: Family Medicine

## 2022-07-09 NOTE — Telephone Encounter (Signed)
Patient says that she discussed her Remus Loffler with you at last appt and was told that she was going to have a prescription for ambien 6.25: take 2 tablets at bedtime prn. I see the prescription in there but she says that is was supposed to be a 3 month supply because her mom is ill and she is going to be in [place]. Does not know when she will be returning back to town. If this is correct please resend rx for 3 month supply. Patient is leaving to go out of town Friday.

## 2022-07-09 NOTE — Telephone Encounter (Signed)
Pt called stating she is having problems with her medication ambien at the pharmacy. Pt stated the provider stated if she had any problems to let her know

## 2022-07-09 NOTE — Telephone Encounter (Signed)
Patient is aware of below. 

## 2022-07-12 ENCOUNTER — Other Ambulatory Visit: Payer: Self-pay | Admitting: Psychiatry

## 2022-07-12 ENCOUNTER — Encounter: Payer: Self-pay | Admitting: Family Medicine

## 2022-07-12 DIAGNOSIS — F411 Generalized anxiety disorder: Secondary | ICD-10-CM

## 2022-07-12 DIAGNOSIS — E538 Deficiency of other specified B group vitamins: Secondary | ICD-10-CM | POA: Insufficient documentation

## 2022-07-12 DIAGNOSIS — E119 Type 2 diabetes mellitus without complications: Secondary | ICD-10-CM | POA: Insufficient documentation

## 2022-07-12 DIAGNOSIS — G4700 Insomnia, unspecified: Secondary | ICD-10-CM

## 2022-07-12 NOTE — Assessment & Plan Note (Signed)
Chronic.  Takes Ambien for sleep. Would like to wean in future once anxiety controlled.  Patient agreeable Refill Ambien CR 6.25 mg 2 tabs at night. Requires non opioid contract and UDS at prior to next refill. Sleep hygiene discussed Continue to follow up with psychiatry

## 2022-07-12 NOTE — Assessment & Plan Note (Signed)
Chronic.  Reports weight increased 5 pounds since last visit. Continue Mounjaro 2.5 mg weekly Continue statin BP well controlled Eye exam due Foot exam at next visit

## 2022-07-12 NOTE — Assessment & Plan Note (Signed)
Chronic.  Well-controlled  without medication

## 2022-07-12 NOTE — Assessment & Plan Note (Signed)
Chronic Celexa was increased to 40 mg by psych Continues to take Buspar and Atarax Continue to follow up with psychiatry for continued management

## 2022-07-14 NOTE — Telephone Encounter (Signed)
Rx sent 

## 2022-08-04 LAB — HM DIABETES EYE EXAM

## 2022-08-05 ENCOUNTER — Encounter: Payer: Self-pay | Admitting: Family Medicine

## 2022-08-05 ENCOUNTER — Telehealth: Payer: Self-pay | Admitting: Psychiatry

## 2022-08-05 NOTE — Telephone Encounter (Signed)
Trying to reschedule patient appointment. 7/11 pt & spouse # is not in service, mychart message sent  7/16 able to leave message on voice mail to call and reschedule

## 2022-08-06 DIAGNOSIS — F411 Generalized anxiety disorder: Secondary | ICD-10-CM | POA: Diagnosis not present

## 2022-08-06 DIAGNOSIS — F901 Attention-deficit hyperactivity disorder, predominantly hyperactive type: Secondary | ICD-10-CM | POA: Diagnosis not present

## 2022-08-06 DIAGNOSIS — F331 Major depressive disorder, recurrent, moderate: Secondary | ICD-10-CM | POA: Diagnosis not present

## 2022-08-07 ENCOUNTER — Ambulatory Visit: Payer: BC Managed Care – PPO | Admitting: Psychiatry

## 2022-08-13 ENCOUNTER — Other Ambulatory Visit: Payer: Self-pay | Admitting: Psychiatry

## 2022-08-13 DIAGNOSIS — G4700 Insomnia, unspecified: Secondary | ICD-10-CM

## 2022-08-13 DIAGNOSIS — F411 Generalized anxiety disorder: Secondary | ICD-10-CM

## 2022-08-14 ENCOUNTER — Other Ambulatory Visit: Payer: Self-pay

## 2022-08-15 ENCOUNTER — Telehealth: Payer: Self-pay | Admitting: Family Medicine

## 2022-08-15 ENCOUNTER — Other Ambulatory Visit: Payer: Self-pay | Admitting: Psychiatry

## 2022-08-15 DIAGNOSIS — F330 Major depressive disorder, recurrent, mild: Secondary | ICD-10-CM

## 2022-08-15 DIAGNOSIS — F411 Generalized anxiety disorder: Secondary | ICD-10-CM

## 2022-08-15 MED FILL — Tirzepatide Soln Auto-injector 2.5 MG/0.5ML: SUBCUTANEOUS | 28 days supply | Qty: 2 | Fill #4 | Status: AC

## 2022-08-15 NOTE — Telephone Encounter (Signed)
Prescription Request  08/15/2022  LOV: 07/02/2022  What is the name of the medication or equipment? Ambien (three month supply)  Have you contacted your pharmacy to request a refill? No   Which pharmacy would you like this sent to?  [id number] [place], [place] - [address] [place] [address] Phone: [phone] Fax: [phone]    Patient notified that their request is being sent to the clinical staff for review and that they should receive a response within 2 business days.   Please advise at Mobile [phone] (mobile)

## 2022-08-18 ENCOUNTER — Other Ambulatory Visit: Payer: Self-pay | Admitting: Family Medicine

## 2022-08-18 ENCOUNTER — Emergency Department
Admission: EM | Admit: 2022-08-18 | Discharge: 2022-08-18 | Disposition: A | Discharge status: Home / Self Care | Payer: BC Managed Care – PPO | Attending: Emergency Medicine | Admitting: Emergency Medicine

## 2022-08-18 ENCOUNTER — Other Ambulatory Visit: Payer: Self-pay

## 2022-08-18 ENCOUNTER — Ambulatory Visit (INDEPENDENT_AMBULATORY_CARE_PROVIDER_SITE_OTHER): Payer: BC Managed Care – PPO | Admitting: Gastroenterology

## 2022-08-18 ENCOUNTER — Encounter: Payer: Self-pay | Admitting: Emergency Medicine

## 2022-08-18 ENCOUNTER — Encounter: Payer: Self-pay | Admitting: Gastroenterology

## 2022-08-18 ENCOUNTER — Telehealth: Payer: Self-pay

## 2022-08-18 ENCOUNTER — Emergency Department: Payer: BC Managed Care – PPO

## 2022-08-18 VITALS — BP 86/50 | HR 58 | Temp 98.0°F | Ht 62.0 in | Wt 115.0 lb

## 2022-08-18 DIAGNOSIS — R1319 Other dysphagia: Secondary | ICD-10-CM

## 2022-08-18 DIAGNOSIS — E119 Type 2 diabetes mellitus without complications: Secondary | ICD-10-CM | POA: Diagnosis not present

## 2022-08-18 DIAGNOSIS — R42 Dizziness and giddiness: Secondary | ICD-10-CM | POA: Insufficient documentation

## 2022-08-18 DIAGNOSIS — K224 Dyskinesia of esophagus: Secondary | ICD-10-CM | POA: Diagnosis not present

## 2022-08-18 DIAGNOSIS — R55 Syncope and collapse: Secondary | ICD-10-CM | POA: Diagnosis not present

## 2022-08-18 DIAGNOSIS — F39 Unspecified mood [affective] disorder: Secondary | ICD-10-CM

## 2022-08-18 DIAGNOSIS — G4700 Insomnia, unspecified: Secondary | ICD-10-CM

## 2022-08-18 LAB — BASIC METABOLIC PANEL
Anion gap: 8 (ref 5–15)
BUN: 17 mg/dL (ref 8–23)
CO2: 23 mmol/L (ref 22–32)
Calcium: 8.6 mg/dL — ABNORMAL LOW (ref 8.9–10.3)
Chloride: 106 mmol/L (ref 98–111)
Creatinine, Ser: 0.81 mg/dL (ref 0.44–1.00)
GFR, Estimated: >60 mL/min (ref >60)
Glucose, Bld: 82 mg/dL (ref 70–99)
Potassium: 4 mmol/L (ref 3.5–5.1)
Sodium: 137 mmol/L (ref 135–145)

## 2022-08-18 LAB — URINALYSIS, ROUTINE W REFLEX MICROSCOPIC
Bilirubin Urine: NEGATIVE
Glucose, UA: NEGATIVE mg/dL
Hgb urine dipstick: NEGATIVE
Ketones, ur: NEGATIVE mg/dL
Leukocytes,Ua: NEGATIVE
Nitrite: NEGATIVE
Protein, ur: NEGATIVE mg/dL
Specific Gravity, Urine: 1.008 (ref 1.005–1.030)
pH: 5 (ref 5.0–8.0)

## 2022-08-18 LAB — CBC
HCT: 38.8 % (ref 36.0–46.0)
Hemoglobin: 13 g/dL (ref 12.0–15.0)
MCH: 30.4 pg (ref 26.0–34.0)
MCHC: 33.5 g/dL (ref 30.0–36.0)
MCV: 90.9 fL (ref 80.0–100.0)
Platelets: 225 10*3/uL (ref 150–400)
RBC: 4.27 MIL/uL (ref 3.87–5.11)
RDW: 12.7 % (ref 11.5–15.5)
WBC: 6.9 10*3/uL (ref 4.0–10.5)
nRBC: 0 % (ref 0.0–0.2)

## 2022-08-18 LAB — TSH: TSH: 2.524 u[IU]/mL (ref 0.350–4.500)

## 2022-08-18 MED ORDER — SODIUM CHLORIDE 0.9 % IV SOLN: Freq: Once | INTRAVENOUS | Status: AC

## 2022-08-18 MED ORDER — ZOLPIDEM TARTRATE ER 12.5 MG PO TBCR
12.5000 mg | EXTENDED_RELEASE_TABLET | Freq: Every evening | ORAL | 0 refills | Status: DC | PRN
Start: 2022-08-18 — End: 2022-09-18

## 2022-08-18 NOTE — Telephone Encounter (Signed)
Patient states her blood pressure seems to be running really low.  Patient states it was in low 90's or low 80's over high 50's both yesterday and the day before.  Patient states it was 87/50 today at her GI appointment.  Patient states she has been experiencing dizziness and fatigue.  I transferred call to Access Nurse.  Prescription Request  08/18/2022  LOV: Visit date not found  What is the name of the medication or equipment?  zolpidem (AMBIEN CR) 12.5 MG CR tablet (Patient states she would like to have a 90-day supply of the 6.25 MG, taking it twice a day)   Have you contacted your pharmacy to request a refill? No   Which pharmacy would you like this sent to?  [id number] [address] [address], [place] - [address] [place] [address] Phone: [phone] Fax: [phone]    Patient notified that their request is being sent to the clinical staff for review and that they should receive a response within 2 business days.   Please advise at Mobile [phone] (mobile)  Patient states she would like to have a 90-day supply of the 6.25 MG, taking it twice a day.

## 2022-08-18 NOTE — ED Provider Notes (Signed)
[facility] Provider Note    Event Date/Time   First MD Initiated Contact with Patient 08/18/22 1836     (approximate)   History   Dizziness   HPI  Kristin [name] is a 62 y.o. female who presents with complaints of dizziness, lightheadedness and low blood pressures.  Patient reports over the last week or so her blood pressures have been low and she has been having episodes of lightheadedness.  She thinks this is likely related to depression and having to deal with her sick mother who lives in [place], she and her husband have been traveling back and forth frequently and this is somewhat overwhelming to her.  She denies chest pain or abdominal pain.  No diarrhea nausea or vomiting.  No new medications.  He does have well-controlled diabetes     Physical Exam   Triage Vital Signs: ED Triage Vitals [08/18/22 1720]  Encounter Vitals Group     BP (!) 125/51     Systolic BP Percentile      Diastolic BP Percentile      Pulse Rate 61     Resp 18     Temp 97.8 F (36.6 C)     Temp Source Oral     SpO2 100 %     Weight 52.2 kg (115 lb 1.3 oz)     Height 1.575 m (5\' 2" )     Head Circumference      Peak Flow      Pain Score 3     Pain Loc      Pain Education      Exclude from Growth Chart     Most recent vital signs: Vitals:   08/18/22 1720  BP: (!) 125/51  Pulse: 61  Resp: 18  Temp: 97.8 F (36.6 C)  SpO2: 100%     General: Awake, no distress.  CV:  Good peripheral perfusion.  No murmur Resp:  Normal effort.  Abd:  No distention.  Other:     ED Results / Procedures / Treatments   Labs (all labs ordered are listed, but only abnormal results are displayed) Labs Reviewed  BASIC METABOLIC PANEL - Abnormal; Notable for the following components:      Result Value   Calcium 8.6 (*)    All other components within normal limits  URINALYSIS, ROUTINE W REFLEX MICROSCOPIC - Abnormal; Notable for the following components:   Color, Urine  STRAW (*)    APPearance CLEAR (*)    All other components within normal limits  CBC  TSH  CBG MONITORING, ED     EKG  ED ECG REPORT I, Jene Every, the attending physician, personally viewed and interpreted this ECG.  Date: 08/18/2022  Rhythm: normal sinus rhythm QRS Axis: normal Intervals: normal ST/T Wave abnormalities: normal Narrative Interpretation: no evidence of acute ischemia    RADIOLOGY CT head unremarkable    PROCEDURES:  Critical Care performed:   Procedures   MEDICATIONS ORDERED IN ED: Medications  0.9 %  sodium chloride infusion (0 mLs Intravenous Stopped 08/18/22 1959)     IMPRESSION / MDM / ASSESSMENT AND PLAN / ED COURSE  I reviewed the triage vital signs and the nursing notes. Patient's presentation is most consistent with acute presentation with potential threat to life or bodily function.  Patient presents with reports of low blood pressure as detailed above.  Blood pressure is reassuring here.  Differential includes dehydration, medication side effect, no fever to suggest infection.  Work reviewed and is reassuring, will treat with IV fluids and reevaluate.  Patient feeling better after treatment and anxious to be discharged.  No indication for admission at this time.      FINAL CLINICAL IMPRESSION(S) / ED DIAGNOSES   Final diagnoses:  Dizziness     Rx / DC Orders   ED Discharge Orders     None        Note:  This document was prepared using Dragon voice recognition software and may include unintentional dictation errors.   Jene Every, MD 08/18/22 2024

## 2022-08-18 NOTE — Patient Instructions (Addendum)
Got you [facility] xray schedule for 08/26/2022 [place] [facility] [place] at 9:30am for a 10:00am scan. Nothing to eat or drink 3 hours prior. If you need to reschedule please call [phone] option 3 and then option 2.

## 2022-08-18 NOTE — ED Triage Notes (Signed)
Pt here with dizziness and hypotension. Pt states she has been feeling dizzy for more than a week. Pt also states that she checked her blood pressure today and it was 87/50. Pt states she feels weak and has a small headache.

## 2022-08-18 NOTE — Progress Notes (Signed)
Arlyss Repress, MD [address]  [address]  [place], [place] [address]  Main: [phone]  Fax: [phone]    Gastroenterology Consultation  Referring Provider:     Dana Allan, MD Primary Care Physician:  Dana Allan, MD Primary Gastroenterologist:  Dr. Arlyss Repress Reason for Consultation: Dysphagia        HPI:   Kristin [name] is a 62 y.o. female referred by Dr. Dana Allan, MD  for consultation & management of dysphagia.  Patient states that she has been experiencing difficulty swallowing for the last 2 months, predominantly to solids and has been strictly on liquid diet only.  Her lowest weight has been 111 pounds because of difficulty swallowing.  When she called my office end of March due to worsening symptoms of dysphagia, I have advised to increase omeprazole to 40 mg twice daily with follow-up appointment.  Patient reports that she does not have heartburn or reflux or regurgitation.  Apparently, she used to experience occasional episodes of difficulty swallowing in the past, this has progressed within last 2 months.  She is now able to swallow pudding like consistency only.  She regained about 3 to 4 pounds.  She denies any other GI symptoms.  She does have severe environmental allergies.  Follow-up visit 08/18/2022 Ms. Prue is here for follow-up of dysphagia.  She underwent upper endoscopy on 05/20/2022, proximal and distal esophageal biopsies showed changes suggestive of reflux only.  I have started her on omeprazole 40 mg once a day.  She also underwent esophageal manometry at [facility] by Dr. Lottie Dawson which revealed hypertensive upper esophageal sphincter as well as hypertensive lower esophageal sphincter with mildly impaired liquid esophageal transit.  Her lowest weight was 113lbs, managed to maintain at 115lbs currently.   NSAIDs: None   Antiplts/Anticoagulants/Anti thrombotics: None   GI Procedures: Had a colonoscopy at age 21, reportedly  normal  Upper endoscopy 05/20/2022 DIAGNOSIS: A.  STOMACH; COLD BIOPSY: - ANTRAL MUCOSA WITH MILD FOVEOLAR HYPERPLASIA AND SUPERFICIAL VASCULAR CONGESTION, SEE COMMENT. - OXYNTIC MUCOSA WITH CHANGES CONSISTENT WITH PROTON PUMP INHIBITOR EFFECT. - NEGATIVE FOR H. PYLORI, INTESTINAL METAPLASIA, DYSPLASIA, AND MALIGNANCY.  B.  ESOPHAGUS, DISTAL; COLD BIOPSY: - SQUAMOCOLUMNAR JUNCTIONAL MUCOSA WITH CHANGES SUGGESTIVE OF REFLUX. - NEGATIVE FOR INTRAEPITHELIAL EOSINOPHILS, DYSPLASIA, AND MALIGNANCY.  C.  ESOPHAGUS, PROXIMAL; COLD BIOPSY: - FRAGMENTS OF SQUAMOUS MUCOSA WITH MILD SPONGIOSIS COMPATIBLE WITH REFLUX. - SEPARATE FRAGMENT OF UNREMARKABLE GASTRIC OXYNTIC MUCOSA. - NEGATIVE FOR INTRAEPITHELIAL EOSINOPHILS, DYSPLASIA, AND MALIGNANCY.  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   Sigmoidoscopy 05/21/2020 - Normal mucosa in the entire examined colon. - A single (solitary) ulcer in the distal rectum. - Non-bleeding external hemorrhoids. DIAGNOSIS:  A. COLON, RANDOM; COLD BIOPSY:  - BENIGN COLONIC MUCOSA WITH NO SIGNIFICANT HISTOPATHOLOGIC CHANGE.  - NEGATIVE FOR FEATURES OF MICROSCOPIC COLITIS.  - NEGATIVE FOR DYSPLASIA AND MALIGNANCY.    EGD and colonoscopy 01/31/2019 - Normal duodenal bulb and second portion of the duodenum. Biopsied. - Normal stomach. Biopsied. - Esophagogastric landmarks identified. - Normal gastroesophageal junction and esophagus.   - Preparation of the colon was fair. - Hemorrhoids found on perianal exam. - The examined portion of the ileum was normal. - The entire examined colon is normal. Biopsied. - The distal rectum and anal verge are normal on retroflexion view.   DIAGNOSIS:  A. DUODENUM; COLD BIOPSY:  - DUODENAL MUCOSA WITH NO  SIGNIFICANT PATHOLOGIC ALTERATION.  - NEGATIVE FOR FEATURES OF CELIAC DISEASE.  - NEGATIVE FOR DYSPLASIA AND  MALIGNANCY.   B. STOMACH; COLD BIOPSY:  - GASTRIC ANTRAL AND OXYNTIC MUCOSA WITH NO SIGNIFICANT PATHOLOGIC  ALTERATION.  - NEGATIVE FOR ACTIVE INFLAMMATION AND H PYLORI.  - NEGATIVE FOR INTESTINAL METAPLASIA, DYSPLASIA, AND MALIGNANCY.   C. COLON, RANDOM; COLD BIOPSY:  - COLONIC MUCOSA WITH NO SIGNIFICANT PATHOLOGIC ALTERATION.  - NEGATIVE FOR MICROSCOPIC COLITIS, DYSPLASIA, AND MALIGNANCY.   D. COLON POLYP, SIGMOID; COLD SNARE:  - TUBULAR ADENOMA.  - NEGATIVE FOR HIGH-GRADE DYSPLASIA AND MALIGNANCY.  She denies family history of inflammatory bowel disease, celiac disease, GI malignancy  Past Medical History:  Diagnosis Date   ADHD    Adhesive capsulitis of right shoulder 07/23/2020   Allergy    Annual physical exam 08/19/2019   Anxiety 12/12/2015   Arthritis    Basal cell carcinoma    nose, left cheek, right upper lip, right posterior shoulder most recent 6 years ago on left cheek   Basal cell carcinoma 03/26/2021   L chest, EDC 04/24/2021   Benign essential hypertension 08/14/2014   Bleeding    Bursitis of right shoulder 12/25/2020   Chicken pox    Chronic diarrhea    Chronic intractable headache 10/20/2017   Chronic migraine w/o aura, not intractable, w/o stat migr 08/08/2020   Complication of anesthesia    blood pressure elevated after gastric sleeve surgery in recovery, pt does not want mask over face; migraines   COVID-19    03/01/20 had MAB 03/03/20 and felt better next day 03/04/20   CTS (carpal tunnel syndrome) 04/05/2020   Dr. Sherryll Burger s/p [facility]/Lidocaine injection 12/05/19   Depression    Diabetes mellitus without complication (HCC)    pre-diabetic currently, was diabetic prior to gastric sleeve surgery    Diabetic polyneuropathy associated with diabetes mellitus due to underlying condition (HCC) 08/08/2020   Disorder of right rotator cuff    10/03/19 MRI right shoulder abnormal Dr. Zettie Cooley   Fall    08/2020   Fall    03/28/21   Fatty liver    Fatty liver  05/06/2017   [place] + 2013    Gout    Hematuria 10/15/2018   History of shingles 06/19/2021   lower back and R side of thigh   Hives 03/28/2020   Hyperlipidemia    Hypertension    no problems since losing weight after gastric sleeve surgery   Hypertension 07/17/2014   IBS (irritable bowel syndrome)    Injury of right wrist 06/26/2016   Insomnia    Migraine    Neuropathy    bilateral feet   Obesity    Obesity (BMI 30-39.9) 05/06/2017   Obesity, diabetes, and hypertension syndrome (HCC) 08/18/2019   Osteopenia    Palpitations 08/14/2014   PONV (postoperative nausea and vomiting)    Squamous cell carcinoma of skin 03/26/2021   SCC IS R mid sternum, EDC 04/24/2021   Tendonitis of shoulder, right 12/25/2020    Past Surgical History:  Procedure Laterality Date   ABDOMINAL HYSTERECTOMY     no h/o abnormal pap still has 1 ovary left   ANKLE ARTHROSCOPY Left 05/29/2016   Procedure: ANKLE ARTHROSCOPY;  Surgeon: Felecia Shelling, DPM;  Location: [facility] OR;  Service: Podiatry;  Laterality: Left;   APPENDECTOMY     basal cell removed     CESAREAN SECTION N/A    1985 & 1995   CHOLECYSTECTOMY     CLOSED  MANIPULATION SHOULDER WITH STERIOD INJECTION Right 03/31/2022   Procedure: MANIPULATION UNDER ANESTHESIA WITH GLENOHUMERAL AND SUBACROMIAL CORTICOSTERIOD INJECTION;  Surgeon: Signa Kell, MD;  Location: [facility] ORS;  Service: Orthopedics;  Laterality: Right;   COLONOSCOPY WITH PROPOFOL N/A 01/31/2019   Procedure: COLONOSCOPY WITH PROPOFOL;  Surgeon: Toney Reil, MD;  Location: [facility] ENDOSCOPY;  Service: Gastroenterology;  Laterality: N/A;   cyst removed from wrist     ESOPHAGOGASTRODUODENOSCOPY (EGD) WITH PROPOFOL N/A 01/31/2019   Procedure: ESOPHAGOGASTRODUODENOSCOPY (EGD) WITH PROPOFOL;  Surgeon: Toney Reil, MD;  Location: [facility] ENDOSCOPY;  Service: Gastroenterology;  Laterality: N/A;   ESOPHAGOGASTRODUODENOSCOPY (EGD) WITH PROPOFOL N/A 05/20/2022   Procedure:  ESOPHAGOGASTRODUODENOSCOPY (EGD) WITH PROPOFOL;  Surgeon: Toney Reil, MD;  Location: [facility] ENDOSCOPY;  Service: Gastroenterology;  Laterality: N/A;   FLEXIBLE SIGMOIDOSCOPY N/A 05/21/2020   Procedure: FLEXIBLE SIGMOIDOSCOPY;  Surgeon: Toney Reil, MD;  Location: [facility] ENDOSCOPY;  Service: Gastroenterology;  Laterality: N/A;   FOOT SURGERY     left foot 11/2016 Dr. Vinnie Level    left elbow surgery     07/2017 Dr Erin Sons epicondylitis    LYSIS OF ADHESION Right 08/27/2021   Procedure: Right shoulder arthroscopic lysis of adhesions, capsular release, and manipulation under anesthesia with corticosteroid injection and biceps tenodesis;  Surgeon: Signa Kell, MD;  Location: [facility];  Service: Orthopedics;  Laterality: Right;   MOHS SURGERY     x 2 face nose and left cheek    OSTEOCHONDROMA EXCISION Left 05/29/2016   Procedure: OSTEOCHONDRAL DRILLING TALUS;  Surgeon: Felecia Shelling, DPM;  Location: [facility] OR;  Service: Podiatry;  Laterality: Left;   PLANTAR FASCIA RELEASE Left 05/29/2016   Procedure: ENDOSCOPIC PLANTAR FASCIOTOMY;  Surgeon: Felecia Shelling, DPM;  Location: [facility] OR;  Service: Podiatry;  Laterality: Left;   ROTATOR CUFF REPAIR Left    SHOULDER ARTHROSCOPY WITH BICEPSTENOTOMY Right 03/31/2022   Procedure: SHOULDER ARTHROSCOPY WITH BICEPS TENOTOMY, ARTHROSCOPIC LYSIS OF ADHESIONS, AND CAPSULAR RELEASE, AND EXTENSIVE GLENOHUMERAL DEBRIDEMENT;  Surgeon: Signa Kell, MD;  Location: [facility] ORS;  Service: Orthopedics;  Laterality: Right;   SHOULDER ARTHROSCOPY WITH ROTATOR CUFF REPAIR  03/31/2022   Procedure: SHOULDER ARTHROSCOPY WITH ROTATOR CUFF REPAIR;  Surgeon: Signa Kell, MD;  Location: [facility] ORS;  Service: Orthopedics;;   STOMACH SURGERY  2017   gastric sleeve surgery   TENNIS ELBOW RELEASE/NIRSCHEL PROCEDURE Left 08/05/2017   Procedure: TENNIS ELBOW RELEASE/NIRSCHEL PROCEDURE;  Surgeon: Erin Sons, MD;  Location: [facility] ORS;  Service: Orthopedics;   Laterality: Left;   TUBAL LIGATION     wedge resection of ovary  1982     Current Outpatient Medications:    acetaminophen (TYLENOL) 500 MG tablet, Take 2 tablets (1,000 mg total) by mouth every 8 (eight) hours., Disp: 90 tablet, Rfl: 2   busPIRone (BUSPAR) 10 MG tablet, TAKE 1 TABLET BY MOUTH TWICE A DAY, Disp: 180 tablet, Rfl: 1   celecoxib (CELEBREX) 200 MG capsule, Take 200 mg by mouth 2 (two) times daily., Disp: , Rfl:    citalopram (CELEXA) 40 MG tablet, TAKE 1 TABLET BY MOUTH EVERY DAY, Disp: 90 tablet, Rfl: 0   fexofenadine (ALLEGRA) 180 MG tablet, Take 1 tablet (180 mg total) by mouth daily as needed for allergies or rhinitis., Disp: 90 tablet, Rfl: 3   gabapentin (NEURONTIN) 300 MG capsule, TAKE 1 CAPSULE BY MOUTH 3 TIMES DAILY AS NEEDED., Disp: 90 capsule, Rfl: 5   hydrOXYzine (ATARAX) 25 MG tablet, TAKE 0.5-1 TABLETS (12.5-25 MG TOTAL) BY MOUTH 3 (  THREE) TIMES DAILY AS NEEDED FOR ANXIETY (AND SLEEP)., Disp: 90 tablet, Rfl: 1   montelukast (SINGULAIR) 10 MG tablet, Take 1 tablet (10 mg total) by mouth at bedtime., Disp: 90 tablet, Rfl: 3   omeprazole (PRILOSEC) 40 MG capsule, Take 1 capsule (40 mg total) by mouth daily., Disp: 90 capsule, Rfl: 3   ondansetron (ZOFRAN-ODT) 4 MG disintegrating tablet, Take 1 tablet (4 mg total) by mouth every 8 (eight) hours as needed. for nausea, Disp: 20 tablet, Rfl: 2   Rimegepant Sulfate (NURTEC) 75 MG TBDP, Take 1 tablet (75 mg total) by mouth daily as needed (take for abortive therapy of migraine, no more than 1 tablet in 24 hours or 10 per month)., Disp: 10 tablet, Rfl: 2   rosuvastatin (CRESTOR) 20 MG tablet, Take 1 tablet (20 mg total) by mouth daily., Disp: 90 tablet, Rfl: 2   tirzepatide (MOUNJARO) 2.5 MG/0.5ML Pen, INJECT 2.5 MG SUBCUTANEOUSLY WEEKLY, Disp: 6 mL, Rfl: 1   topiramate (TOPAMAX) 50 MG tablet, Take 1 tablet (50 mg total) by mouth daily., Disp: 90 tablet, Rfl: 3   zolpidem (AMBIEN CR) 12.5 MG CR tablet, Take 12.5 mg by mouth at  bedtime as needed., Disp: , Rfl:    Calcium Citrate-Vitamin D 500-10 MG-MCG CHEW, Take 1 tablet two times a day (Patient not taking: Reported on 08/18/2022), Disp: , Rfl:    Cyanocobalamin (VITAMIN B-12) 1000 MCG SUBL, Place 1 tablet (1,000 mcg total) under the tongue daily. (Patient not taking: Reported on 08/18/2022), Disp: 90 tablet, Rfl: 3    Family History  Problem Relation Age of Onset   Heart disease Mother    Breast cancer Mother 23       metastatic, now hip, spine, and leg    Arthritis Mother    Hyperlipidemia Mother    Cancer Mother        spinal surgery. breast dx'ed age 21 now metastatic 63 as of 07/13/18   Cystic fibrosis Mother        carrier    Heart disease Father    Lung cancer Father        smoker   Hyperlipidemia Father    Hypertension Father    Depression Sister    Diabetes Sister    Stroke Sister    Other Sister        covid +   Diabetes Sister    Diabetes Maternal Aunt    Alcohol abuse Maternal Aunt    Diabetes Maternal Aunt    Alcohol abuse Maternal Uncle    Diabetes Maternal Uncle    Alcohol abuse Paternal Uncle    Diabetes Maternal Grandfather    Hyperlipidemia Maternal Grandfather    Arthritis Maternal Grandmother    Sudden death Cousin    Schizophrenia Cousin    Cystic fibrosis Other        great niece    Factor V Leiden deficiency Other        uncle and cousins and m GF (all maternal)   Alcohol abuse Niece    Alcohol abuse Nephew    ADD / ADHD Son    Depression Son    ADD / ADHD Son    Post-traumatic stress disorder Son    Colon cancer Neg Hx    Ovarian cancer Neg Hx      Social History   Tobacco Use   Smoking status: Never   Smokeless tobacco: Never  Vaping Use   Vaping status: Never Used  Substance Use Topics  Alcohol use: Never   Drug use: Never    Allergies as of 08/18/2022 - Review Complete 08/18/2022  Allergen Reaction Noted   Tetracyclines & related Swelling and Rash 06/04/2014   Amoxicillin  12/10/2018   Alcohol  Rash 12/07/2014   Azithromycin Rash 07/19/2018   Metoprolol Diarrhea 12/07/2014    Review of Systems:    All systems reviewed and negative except where noted in HPI.   Physical Exam:  BP (!) 86/50 (BP Location: Left Arm, Patient Position: Sitting, Cuff Size: Normal)   Pulse (!) 58   Temp 98 F (36.7 C) (Oral)   Ht 5\' 2"  (1.575 m)   Wt 115 lb (52.2 kg)   BMI 21.03 kg/m  No LMP recorded. Patient has had a hysterectomy.  General:   Alert, moderately built, moderately nourished, pleasant and cooperative in NAD Head:  Normocephalic and atraumatic. Eyes:  Sclera clear, no icterus.   Conjunctiva pink. Ears:  Normal auditory acuity. Nose:  No deformity, discharge, or lesions. Mouth:  No deformity or lesions,oropharynx pink & moist. Neck:  Supple; no masses or thyromegaly. Lungs:  Respirations even and unlabored.  Clear throughout to auscultation.   No wheezes, crackles, or rhonchi. No acute distress. Heart:  Regular rate and rhythm; no murmurs, clicks, rubs, or gallops. Abdomen:  Normal bowel sounds. Soft, non-tender and non-distended without masses, hepatosplenomegaly or hernias noted.  No guarding or rebound tenderness.   Rectal: Not performed Msk:  Symmetrical without gross deformities. Good, equal movement & strength bilaterally. Pulses:  Normal pulses noted. Extremities:  No clubbing or edema.  No cyanosis. Neurologic:  Alert and oriented x3;  grossly normal neurologically. Skin:  Intact without significant lesions or rashes. No jaundice. Psych:  Alert and cooperative. Normal mood and affect.  Imaging Studies: Reviewed  Assessment and Plan:   Alila Rushworth Deniston is a 62 y.o. female with history of diabetes, hypertension, fatty liver, obesity, significantly lost weight by following healthy diet and lifestyle, diabetes well under control, fatty liver has resolved is seen in consultation for approximately 6 months history of progressively worsening dysphagia resulting in  unintentional weight loss  Dysphagia to solids and liquids Patient had upper endoscopy in 2021 which was unremarkable Repeat upper endoscopy with proximal and distal esophageal biopsies showed mild reflux changes only separately Subsequently, esophageal manometry at [facility] showed hypertensive upper esophageal sphincter and hypertensive lower esophageal sphincter Continue omeprazole 40 mg once a day at this time Recommend barium esophagogram to evaluate for any Zenker's diverticulum  Recommend modified barium swallow After this test, will schedule EGD for empiric dilation and possibly ENT referral Recommend referral to motility expert at [facility], Dr. Lottie Dawson for further management Continue soft diet only   Follow up after the above workup   Arlyss Repress, MD

## 2022-08-18 NOTE — ED Notes (Signed)
See triage note  Presents with some dizziness  States she noticed that her b/p was low earlier today   No fever or n/v/d

## 2022-08-18 NOTE — Addendum Note (Signed)
Addended by: Radene Knee L on: 08/18/2022 03:23 PM   Modules accepted: Orders

## 2022-08-21 ENCOUNTER — Telehealth: Payer: Self-pay | Admitting: *Deleted

## 2022-08-21 NOTE — Transitions of Care (Post Inpatient/ED Visit) (Signed)
   08/21/2022  Name: Kristin [name] MRN: [id number] DOB: 1960/12/28  Today's TOC FU Call Status: Today's TOC FU Call Status:: Unsuccessful Call (1st Attempt) Unsuccessful Call (1st Attempt) Date: 08/21/22  Attempted to reach the patient regarding the most recent Inpatient/ED visit.  Follow Up Plan: Additional outreach attempts will be made to reach the patient to complete the Transitions of Care (Post Inpatient/ED visit) call.   Gean Maidens BSN RN [facility] [phone]

## 2022-08-22 ENCOUNTER — Telehealth: Payer: Self-pay | Admitting: *Deleted

## 2022-08-22 NOTE — Transitions of Care (Post Inpatient/ED Visit) (Signed)
08/22/2022  Name: Kristin [name] MRN: [id number] DOB: 05/06/1960  Today's TOC FU Call Status: Today's TOC FU Call Status:: Successful TOC FU Call Completed TOC FU Call Complete Date: 08/22/22  Transition Care Management Follow-up Telephone Call Date of Discharge: 08/19/22 Discharge Facility: [facility]) Type of Discharge: Emergency Department Reason for ED Visit: Other: (Dizziness) How have you been since you were released from the hospital?: Better Any questions or concerns?: No  Items Reviewed: Did you receive and understand the discharge instructions provided?: Yes Medications obtained,verified, and reconciled?: Yes (Medications Reviewed) Any new allergies since your discharge?: No Dietary orders reviewed?: No Do you have support at home?: Yes People in Home: spouse Name of Support/Comfort Primary Source: Sean  Medications Reviewed Today: Medications Reviewed Today     Reviewed by Luella Cook, RN (Case Manager) on 08/22/22 at 1233  Med List Status: <None>   Medication Order Taking? Sig Documenting Provider Last Dose Status Informant  acetaminophen (TYLENOL) 500 MG tablet [id number] Yes Take 2 tablets (1,000 mg total) by mouth every 8 (eight) hours. Signa Kell, MD Taking Active   busPIRone (BUSPAR) 10 MG tablet [id number] Yes TAKE 1 TABLET BY MOUTH TWICE A DAY Darcel Smalling, MD Taking Active   Calcium Citrate-Vitamin D 500-10 MG-MCG CHEW [id number]  Take 1 tablet two times a day  Patient not taking: Reported on 08/18/2022   Dana Allan, MD  Active   celecoxib (CELEBREX) 200 MG capsule [id number] Yes Take 200 mg by mouth 2 (two) times daily. [provider] Taking Active   citalopram (CELEXA) 40 MG tablet [id number] Yes TAKE 1 TABLET BY MOUTH EVERY DAY Eappen, Saramma, MD Taking Active   Cyanocobalamin (VITAMIN B-12) 1000 MCG SUBL [id number]  Place 1 tablet (1,000 mcg total) under the tongue daily.  Patient not taking: Reported  on 08/18/2022   Dana Allan, MD  Active   fexofenadine [facility]) 180 MG tablet [id number] Yes Take 1 tablet (180 mg total) by mouth daily as needed for allergies or rhinitis. Dana Allan, MD Taking Active   gabapentin (NEURONTIN) 300 MG capsule [id number] Yes TAKE 1 CAPSULE BY MOUTH 3 TIMES DAILY AS NEEDED. Lomax, Amy, NP Taking Active   hydrOXYzine (ATARAX) 25 MG tablet [id number] Yes TAKE 0.5-1 TABLETS (12.5-25 MG TOTAL) BY MOUTH 3 (THREE) TIMES DAILY AS NEEDED FOR ANXIETY (AND SLEEP). Jomarie Longs, MD Taking Active   montelukast (SINGULAIR) 10 MG tablet [id number] Yes Take 1 tablet (10 mg total) by mouth at bedtime. Bethanie Dicker, NP Taking Active   omeprazole (PRILOSEC) 40 MG capsule [id number] Yes Take 1 capsule (40 mg total) by mouth daily. Toney Reil, MD Taking Active   ondansetron (ZOFRAN-ODT) 4 MG disintegrating tablet [id number] Yes Take 1 tablet (4 mg total) by mouth every 8 (eight) hours as needed. for nausea Lomax, Amy, NP Taking Active   Rimegepant Sulfate (NURTEC) 75 MG TBDP [id number] Yes Take 1 tablet (75 mg total) by mouth daily as needed (take for abortive therapy of migraine, no more than 1 tablet in 24 hours or 10 per month). Lomax, Amy, NP Taking Active   rosuvastatin (CRESTOR) 20 MG tablet [id number] Yes Take 1 tablet (20 mg total) by mouth daily. Dana Allan, MD Taking Active   tirzepatide [facility]) 2.5 MG/0.5ML Pen [id number] Yes INJECT 2.5 MG SUBCUTANEOUSLY WEEKLY Dana Allan, MD Taking Active   topiramate (TOPAMAX) 50 MG tablet [id number] Yes Take 1 tablet (50 mg total) by mouth daily. Shawnie Dapper, NP Taking Active  zolpidem (AMBIEN CR) 12.5 MG CR tablet [id number] Yes Take 1 tablet (12.5 mg total) by mouth at bedtime as needed. Dana Allan, MD Taking Active             Home Care and Equipment/Supplies: Were Home Health Services Ordered?: NA Any new equipment or medical supplies ordered?: NA  Functional Questionnaire: Do you need assistance with  bathing/showering or dressing?: No Do you need assistance with meal preparation?: No Do you need assistance with eating?: No Do you have difficulty maintaining continence: No Do you need assistance with getting out of bed/getting out of a chair/moving?: No Do you have difficulty managing or taking your medications?: No  Follow up appointments reviewed: PCP Follow-up appointment confirmed?: No MD Provider Line [phone] Given: Yes (Follow-Ups: Follow up with Dana Allan, MD (Family Medicine); As needed) Specialist Hospital Follow-up appointment confirmed?: NA Do you need transportation to your follow-up appointment?: No Do you understand care options if your condition(s) worsen?: Yes-patient verbalized understanding  SDOH Interventions Today    Flowsheet Row Most Recent Value  SDOH Interventions   Food Insecurity Interventions Intervention Not Indicated  Housing Interventions Intervention Not Indicated  Transportation Interventions Intervention Not Indicated      Interventions Today    Flowsheet Row Most Recent Value  General Interventions   General Interventions Discussed/Reviewed General Interventions Discussed, General Interventions Reviewed, Doctor Visits  [Per patient she is doing good and will make and appt if needed.]  Doctor Visits Discussed/Reviewed Doctor Visits Discussed  Pharmacy Interventions   Pharmacy Dicussed/Reviewed Pharmacy Topics Discussed      TOC Interventions Today    Flowsheet Row Most Recent Value  TOC Interventions   TOC Interventions Discussed/Reviewed TOC Interventions Discussed, TOC Interventions Reviewed       Gean Maidens BSN RN [facility] [phone]

## 2022-08-26 ENCOUNTER — Ambulatory Visit
Admission: RE | Admit: 2022-08-26 | Discharge: 2022-08-26 | Disposition: A | Discharge status: Home / Self Care | Payer: BC Managed Care – PPO | Source: Ambulatory Visit | Attending: Gastroenterology | Admitting: Gastroenterology

## 2022-08-26 DIAGNOSIS — R1319 Other dysphagia: Secondary | ICD-10-CM | POA: Diagnosis not present

## 2022-08-26 DIAGNOSIS — R1310 Dysphagia, unspecified: Secondary | ICD-10-CM | POA: Diagnosis not present

## 2022-08-27 ENCOUNTER — Telehealth: Payer: Self-pay

## 2022-08-27 DIAGNOSIS — R1319 Other dysphagia: Secondary | ICD-10-CM

## 2022-08-27 NOTE — Telephone Encounter (Signed)
-----   Message from [facility] sent at 08/26/2022  4:49 PM EDT ----- Regarding: Speech pathology referral Kristin [name]  Please refer her to speech pathology as well I thought this referral was placed already Dx: Dysphagia  RV

## 2022-08-27 NOTE — Telephone Encounter (Signed)
We did order the speech pathology order again through [facility] going to call today also.

## 2022-08-28 ENCOUNTER — Ambulatory Visit: Payer: BC Managed Care – PPO | Admitting: Gastroenterology

## 2022-08-28 ENCOUNTER — Other Ambulatory Visit (HOSPITAL_COMMUNITY): Payer: Self-pay | Admitting: *Deleted

## 2022-08-28 DIAGNOSIS — R1310 Dysphagia, unspecified: Secondary | ICD-10-CM

## 2022-08-29 NOTE — Telephone Encounter (Signed)
Patient is schedule for 09/16/2022 they canceled because they do not do out patient at [facility].

## 2022-09-01 DIAGNOSIS — F331 Major depressive disorder, recurrent, moderate: Secondary | ICD-10-CM | POA: Diagnosis not present

## 2022-09-01 DIAGNOSIS — F411 Generalized anxiety disorder: Secondary | ICD-10-CM | POA: Diagnosis not present

## 2022-09-01 DIAGNOSIS — F901 Attention-deficit hyperactivity disorder, predominantly hyperactive type: Secondary | ICD-10-CM | POA: Diagnosis not present

## 2022-09-03 ENCOUNTER — Telehealth: Payer: Self-pay

## 2022-09-03 NOTE — Telephone Encounter (Signed)
-----   Message from [facility] sent at 09/03/2022  4:26 PM EDT ----- Morrie Sheldon  Did [facility] accept our referral? We referred her for esophageal dysmotility  RV

## 2022-09-03 NOTE — Telephone Encounter (Signed)
The referral is still pending. I will call about it tomorrow

## 2022-09-04 NOTE — Telephone Encounter (Signed)
The referral is ready to scheduled they tried to reach out to patient once but reach out to patient two more times. Patient can also call [phone] called patient and patient states she will call them today to schedule appointment

## 2022-09-11 ENCOUNTER — Ambulatory Visit: Payer: BC Managed Care – PPO | Admitting: Family Medicine

## 2022-09-16 ENCOUNTER — Encounter (HOSPITAL_COMMUNITY): Payer: BC Managed Care – PPO

## 2022-09-17 NOTE — Telephone Encounter (Signed)
Pt called in stating that she needs her Ambien refill. Pt stated that as per her last visit with provider, she will be getting a 90 day supply and it will be 6.5 mg instead of 12.5. pt also stated, she will be taking 2 at night.   Prescription Request  09/17/2022  LOV: 07/02/2022  What is the name of the medication or equipment? zolpidem (AMBIEN CR) 6.5 MG CR tablet   Have you contacted your pharmacy to request a refill? Yes   Which pharmacy would you like this sent to?   [id number] [address], [place] - [address] [place] [address] Phone: [phone] Fax: [phone]    Patient notified that their request is being sent to the clinical staff for review and that they should receive a response within 2 business days.   Please advise at Mobile [phone] (mobile)

## 2022-09-18 ENCOUNTER — Other Ambulatory Visit: Payer: Self-pay | Admitting: Family Medicine

## 2022-09-18 DIAGNOSIS — G4709 Other insomnia: Secondary | ICD-10-CM

## 2022-09-18 MED ORDER — ZOLPIDEM TARTRATE ER 6.25 MG PO TBCR
12.5000 mg | EXTENDED_RELEASE_TABLET | Freq: Every evening | ORAL | 3 refills | Status: DC | PRN
Start: 2022-09-18 — End: 2023-03-18

## 2022-09-18 NOTE — Telephone Encounter (Signed)
Called and informed her of this information. 

## 2022-09-23 ENCOUNTER — Ambulatory Visit: Payer: BC Managed Care – PPO | Admitting: Psychiatry

## 2022-09-23 ENCOUNTER — Encounter: Payer: Self-pay | Admitting: Psychiatry

## 2022-09-23 VITALS — BP 95/66 | HR 85 | Temp 97.7°F | Ht 62.0 in | Wt 112.4 lb

## 2022-09-23 DIAGNOSIS — F411 Generalized anxiety disorder: Secondary | ICD-10-CM | POA: Diagnosis not present

## 2022-09-23 DIAGNOSIS — F330 Major depressive disorder, recurrent, mild: Secondary | ICD-10-CM

## 2022-09-23 DIAGNOSIS — G4700 Insomnia, unspecified: Secondary | ICD-10-CM

## 2022-09-23 DIAGNOSIS — Z634 Disappearance and death of family member: Secondary | ICD-10-CM

## 2022-09-23 MED ORDER — BUSPIRONE HCL 10 MG PO TABS
10.0000 mg | ORAL_TABLET | Freq: Every day | ORAL | Status: DC
Start: 2022-09-23 — End: 2022-09-25

## 2022-09-23 MED ORDER — CLONAZEPAM 0.5 MG PO TABS
0.2500 mg | ORAL_TABLET | Freq: Every day | ORAL | 0 refills | Status: DC | PRN
Start: 2022-09-23 — End: 2023-04-30

## 2022-09-23 NOTE — Progress Notes (Signed)
[facility] MD OP Progress Note  09/23/2022 4:04 PM Zady Reader Mcpeek  MRN:  [id number]  Chief Complaint:  Chief Complaint  Patient presents with   Follow-up   Anxiety   Depression   Medication Refill   Grief   HPI: Nali Farella Sarkis is a 62 year old Caucasian female, married, retired, lives in [place], has a history of GAD, MDD, insomnia, bereavement, diabetes mellitus, polyneuropathy, tendinopathy of right biceps [place] status post right shoulder arthroscopy with repair of subscapularis and supraspinatus, subacromial decompression with extensive debridement of shoulder and distal clavicle resection, chronic migraine was evaluated in office today.  Patient today reports she just lost her mother on Sunday.  Her mother was struggling with terminal breast cancer for the past 5 years.  Patient became tearful when she discussed this.  Patient is currently grieving however does report good support system from family.  She also has a therapist, currently follows up with Ms. Felecia Jan and reports therapy sessions are beneficial.  Patient reports she does not know if the BuSpar is helpful since she is currently on a very low dosage of 10 mg.  She developed side effects to higher dosage and hence decided to lower the dose.  Patient is compliant on the Celexa 40 mg daily.  Denies side effects.  She does have struggles with sleep.  She reports her mother needed her support at night and hence that affected her sleep recently.  However once she got back from [place] after her mother passed she also has been struggling with some nasal congestion and that likely also affecting sleep.  She agrees to go back on her antihistamine to help with her seasonal allergies.  She also has Ambien available.  She will let writer know if she is interested in change of sleep medication.  Patient denies any suicidality, homicidality or perceptual disturbances.  Patient denies any other concerns today.  Visit  Diagnosis:    ICD-10-CM   1. GAD (generalized anxiety disorder)  F41.1 clonazePAM (KLONOPIN) 0.5 MG tablet    busPIRone (BUSPAR) 10 MG tablet    2. MDD (major depressive disorder), recurrent episode, mild (HCC)  F33.0     3. Insomnia, unspecified type  G47.00 clonazePAM (KLONOPIN) 0.5 MG tablet    4. Bereavement  Z63.4 clonazePAM (KLONOPIN) 0.5 MG tablet      Past Psychiatric History: I have reviewed past psychiatric history from progress note on 04/03/2022.  Past trials of medications like Prozac, trazodone, nortriptyline, amitriptyline for headaches and neuropathy  Past Medical History:  Past Medical History:  Diagnosis Date   ADHD    Adhesive capsulitis of right shoulder 07/23/2020   Allergy    Annual physical exam 08/19/2019   Anxiety 12/12/2015   Arthritis    Basal cell carcinoma    nose, left cheek, right upper lip, right posterior shoulder most recent 6 years ago on left cheek   Basal cell carcinoma 03/26/2021   L chest, EDC 04/24/2021   Benign essential hypertension 08/14/2014   Bleeding    Bursitis of right shoulder 12/25/2020   Chicken pox    Chronic diarrhea    Chronic intractable headache 10/20/2017   Chronic migraine w/o aura, not intractable, w/o stat migr 08/08/2020   Complication of anesthesia    blood pressure elevated after gastric sleeve surgery in recovery, pt does not want mask over face; migraines   COVID-19    03/01/20 had MAB 03/03/20 and felt better next day 03/04/20   CTS (carpal tunnel syndrome) 04/05/2020  Dr. Sherryll Burger s/p [facility]/Lidocaine injection 12/05/19   Depression    Diabetes mellitus without complication (HCC)    pre-diabetic currently, was diabetic prior to gastric sleeve surgery    Diabetic polyneuropathy associated with diabetes mellitus due to underlying condition (HCC) 08/08/2020   Disorder of right rotator cuff    10/03/19 MRI right shoulder abnormal Dr. Zettie Cooley   Fall    08/2020   Fall    03/28/21   Fatty liver    Fatty liver  05/06/2017   [place] + 2013    Gout    Hematuria 10/15/2018   History of shingles 06/19/2021   lower back and R side of thigh   Hives 03/28/2020   Hyperlipidemia    Hypertension    no problems since losing weight after gastric sleeve surgery   Hypertension 07/17/2014   IBS (irritable bowel syndrome)    Injury of right wrist 06/26/2016   Insomnia    Migraine    Neuropathy    bilateral feet   Obesity    Obesity (BMI 30-39.9) 05/06/2017   Obesity, diabetes, and hypertension syndrome (HCC) 08/18/2019   Osteopenia    Palpitations 08/14/2014   PONV (postoperative nausea and vomiting)    Squamous cell carcinoma of skin 03/26/2021   SCC IS R mid sternum, EDC 04/24/2021   Tendonitis of shoulder, right 12/25/2020    Past Surgical History:  Procedure Laterality Date   ABDOMINAL HYSTERECTOMY     no h/o abnormal pap still has 1 ovary left   ANKLE ARTHROSCOPY Left 05/29/2016   Procedure: ANKLE ARTHROSCOPY;  Surgeon: Felecia Shelling, DPM;  Location: [facility];  Service: Podiatry;  Laterality: Left;   APPENDECTOMY     basal cell removed     CESAREAN SECTION N/A    1985 & 1995   CHOLECYSTECTOMY     CLOSED MANIPULATION SHOULDER WITH STERIOD INJECTION Right 03/31/2022   Procedure: MANIPULATION UNDER ANESTHESIA WITH GLENOHUMERAL AND SUBACROMIAL CORTICOSTERIOD INJECTION;  Surgeon: Signa Kell, MD;  Location: [facility] ORS;  Service: Orthopedics;  Laterality: Right;   COLONOSCOPY WITH PROPOFOL N/A 01/31/2019   Procedure: COLONOSCOPY WITH PROPOFOL;  Surgeon: Toney Reil, MD;  Location: [facility] END[facility]COPY;  Service: Gastroenterology;  Laterality: N/A;   cyst removed from wrist     ESOPHAGOGASTRODUODENOSCOPY (EGD) WITH PROPOFOL N/A 01/31/2019   Procedure: ESOPHAGOGASTRODUODENOSCOPY (EGD) WITH PROPOFOL;  Surgeon: Toney Reil, MD;  Location: [facility] ENDOSCOPY;  Service: Gastroenterology;  Laterality: N/A;   ESOPHAGOGASTRODUODENOSCOPY (EGD) WITH PROPOFOL N/A 05/20/2022   Procedure:  ESOPHAGOGASTRODUODENOSCOPY (EGD) WITH PROPOFOL;  Surgeon: Toney Reil, MD;  Location: [facility] ENDOSCOPY;  Service: Gastroenterology;  Laterality: N/A;   FLEXIBLE SIGMOIDOSCOPY N/A 05/21/2020   Procedure: FLEXIBLE SIGMOIDOSCOPY;  Surgeon: Toney Reil, MD;  Location: [facility] ENDOSCOPY;  Service: Gastroenterology;  Laterality: N/A;   FOOT SURGERY     left foot 11/2016 Dr. Vinnie Level    left elbow surgery     07/2017 Dr Erin Sons epicondylitis    LYSIS OF ADHESION Right 08/27/2021   Procedure: Right shoulder arthroscopic lysis of adhesions, capsular release, and manipulation under anesthesia with corticosteroid injection and biceps tenodesis;  Surgeon: Signa Kell, MD;  Location[facility];  Service: Orthopedics;  Laterality: Right;   MOHS SURGERY     x 2 face nose and left cheek    OSTEOCHONDROMA EXCISION Left 05/29/2016   Procedure: OSTEOCHONDRAL DRILLING TALUS;  Surgeon: Felecia Shelling, DPM;  Location: [facility];  Service: Podiatry;  Laterality: Left;   PLANTAR FASCIA  RELEASE Left 05/29/2016   Procedure: ENDOSCOPIC PLANTAR FASCIOTOMY;  Surgeon: Felecia Shelling, DPM;  Location: [facility];  Service: Podiatry;  Laterality: Left;   ROTATOR CUFF REPAIR Left    SHOULDER ARTHROSCOPY WITH BICEPSTENOTOMY Right 03/31/2022   Procedure: SHOULDER ARTHROSCOPY WITH BICEPS TENOTOMY, ARTHROSCOPIC LYSIS OF ADHESIONS, AND CAPSULAR RELEASE, AND EXTENSIVE GLENOHUMERAL DEBRIDEMENT;  Surgeon: Signa Kell, MD;  Location: [facility] ORS;  Service: Orthopedics;  Laterality: Right;   SHOULDER ARTHROSCOPY WITH ROTATOR CUFF REPAIR  03/31/2022   Procedure: SHOULDER ARTHROSCOPY WITH ROTATOR CUFF REPAIR;  Surgeon: Signa Kell, MD;  Location: [facility] OR[facility];  Service: Orthopedics;;   STOMACH SURGERY  2017   gastric sleeve surgery   TENNIS ELBOW RELEASE/NIRSCHEL PROCEDURE Left 08/05/2017   Procedure: TENNIS ELBOW RELEASE/NIRSCHEL PROCEDURE;  Surgeon: Erin Sons, MD;  Location: [facility] ORS;  Service: Orthopedics;   Laterality: Left;   TUBAL LIGATION     wedge resection of ovary  1982    Family Psychiatric History: I have reviewed family psychiatric history from progress note on 04/03/2022.  Family History:  Family History  Problem Relation Age of Onset   Heart disease Mother    Breast cancer Mother 67       metastatic, now hip, spine, and leg    Arthritis Mother    Hyperlipidemia Mother    Cancer Mother        spinal surgery. breast dx'ed age 64 now metastatic 31 as of 07/13/18   Cystic fibrosis Mother        carrier    Heart disease Father    Lung cancer Father        smoker   Hyperlipidemia Father    Hypertension Father    Depression Sister    Diabetes Sister    Stroke Sister    Other Sister        covid +   Diabetes Sister    Diabetes Maternal Aunt    Alcohol abuse Maternal Aunt    Diabetes Maternal Aunt    Alcohol abuse Maternal Uncle    Diabetes Maternal Uncle    Alcohol abuse Paternal Uncle    Diabetes Maternal Grandfather    Hyperlipidemia Maternal Grandfather    Arthritis Maternal Grandmother    Sudden death Cousin    Schizophrenia Cousin    Cystic fibrosis Other        great niece    Factor V Leiden deficiency Other        uncle and cousins and m GF (all maternal)   Alcohol abuse Niece    Alcohol abuse Nephew    ADD / ADHD Son    Depression Son    ADD / ADHD Son    Post-traumatic stress disorder Son    Colon cancer Neg Hx    Ovarian cancer Neg Hx     Social History: I have reviewed social history from progress note on 04/03/2022. Social History   Socioeconomic History   Marital status: Married    Spouse name: Not on file   Number of children: 3   Years of education: Not on file   Highest education level: Some college, no degree  Occupational History   Not on file  Tobacco Use   Smoking status: Never   Smokeless tobacco: Never  Vaping Use   Vaping status: Never Used  Substance and Sexual Activity   Alcohol use: Never   Drug use: Never   Sexual  activity: Yes    Partners: Male    Birth control/protection: Surgical  Other Topics Concern   Not on file  Social History Narrative   Married x 37 years as of 10/2017    Kids 3 boys youngest is Renae Fickle   Write childrens books    Retired from [employer] 01/2017    Enjoys traveling    Right handed   Caffeine: quit 2009   Social Determinants of Health   Financial Resource Strain: Low Risk  (07/01/2022)   Overall Financial Resource Strain (CARDIA)    Difficulty of Paying Living Expenses: Not very hard  Food Insecurity: No Food Insecurity (08/22/2022)   Hunger Vital Sign    Worried About Running Out of Food in the Last Year: Never true    Ran Out of Food in the Last Year: Never true  Transportation Needs: No Transportation Needs (08/22/2022)   PRAPARE - Administrator, Civil Service (Medical): No    Lack of Transportation (Non-Medical): No  Physical Activity: Insufficiently Active (07/01/2022)   Exercise Vital Sign    Days of Exercise per Week: 2 days    Minutes of Exercise per Session: 20 min  Stress: Stress Concern Present (07/01/2022)   [employer] of Occupational Health - Occupational Stress Questionnaire    Feeling of Stress : Very much  Social Connections: Moderately Integrated (07/01/2022)   Social Connection and Isolation Panel [NHANES]    Frequency of Communication with Friends and Family: Three times a week    Frequency of Social Gatherings with Friends and Family: Twice a week    Attends Religious Services: More than 4 times per year    Active Member of [employer] or Organizations: No    Attends Engineer, structural: Not on file    Marital Status: Married    Allergies:  Allergies  Allergen Reactions   Tetracyclines & Related Swelling and Rash    FACE SWELL, RASH ON CHEST   Amoxicillin    Alcohol Rash    ABDOMINAL CRAMPS, drinking alcohol   Azithromycin Rash   Metoprolol Diarrhea    Metabolic Disorder Labs: Lab Results  Component Value Date    HGBA1C 5.2 07/03/2022   No results found for: "PROLACTIN" Lab Results  Component Value Date   CHOL 151 07/03/2022   TRIG 70.0 07/03/2022   HDL 59.20 07/03/2022   CHOLHDL 3 07/03/2022   VLDL 14.0 07/03/2022   LDLCALC 77 07/03/2022   LDLCALC 53 01/09/2022   Lab Results  Component Value Date   TSH 2.524 08/18/2022   TSH 2.37 01/09/2022    Therapeutic Level Labs: No results found for: "LITHIUM" No results found for: "VALPROATE" No results found for: "CBMZ"  Current Medications: Current Outpatient Medications  Medication Sig Dispense Refill   acetaminophen (TYLENOL) 500 MG tablet Take 2 tablets (1,000 mg total) by mouth every 8 (eight) hours. 90 tablet 2   Calcium Citrate-Vitamin D 500-10 MG-MCG CHEW Take 1 tablet two times a day     celecoxib (CELEBREX) 200 MG capsule Take by mouth.     citalopram (CELEXA) 40 MG tablet TAKE 1 TABLET BY MOUTH EVERY DAY 90 tablet 0   clonazePAM (KLONOPIN) 0.5 MG tablet Take 0.5-1 tablets (0.25-0.5 mg total) by mouth daily as needed for anxiety. Limited supply 10 tablet 0   Cyanocobalamin (VITAMIN B-12) 1000 MCG SUBL Place 1 tablet (1,000 mcg total) under the tongue daily. 90 tablet 3   fexofenadine (ALLEGRA) 180 MG tablet Take 1 tablet (180 mg total) by mouth daily as needed for allergies or rhinitis. 90 tablet 3  gabapentin (NEURONTIN) 300 MG capsule TAKE 1 CAPSULE BY MOUTH 3 TIMES DAILY AS NEEDED. 90 capsule 5   hydrOXYzine (ATARAX) 25 MG tablet TAKE 0.5-1 TABLETS (12.5-25 MG TOTAL) BY MOUTH 3 (THREE) TIMES DAILY AS NEEDED FOR ANXIETY (AND SLEEP). 90 tablet 1   montelukast (SINGULAIR) 10 MG tablet Take 1 tablet (10 mg total) by mouth at bedtime. 90 tablet 3   omeprazole (PRILOSEC) 40 MG capsule Take 1 capsule (40 mg total) by mouth daily. 90 capsule 3   ondansetron (ZOFRAN-ODT) 4 MG disintegrating tablet Take 1 tablet (4 mg total) by mouth every 8 (eight) hours as needed. for nausea 20 tablet 2   Rimegepant Sulfate (NURTEC) 75 MG TBDP Take 1  tablet (75 mg total) by mouth daily as needed (take for abortive therapy of migraine, no more than 1 tablet in 24 hours or 10 per month). 10 tablet 2   rosuvastatin (CRESTOR) 20 MG tablet Take 1 tablet (20 mg total) by mouth daily. 90 tablet 2   tirzepatide (MOUNJARO) 2.5 MG/0.5ML Pen INJECT 2.5 MG SUBCUTANEOUSLY WEEKLY 6 mL 1   topiramate (TOPAMAX) 50 MG tablet Take 1 tablet (50 mg total) by mouth daily. 90 tablet 3   zolpidem (AMBIEN CR) 6.25 MG CR tablet Take 2 tablets (12.5 mg total) by mouth at bedtime as needed for sleep. 120 tablet 3   busPIRone (BUSPAR) 10 MG tablet Take 1 tablet (10 mg total) by mouth daily.     No current facility-administered medications for this visit.   Facility-Administered Medications Ordered in Other Visits  Medication Dose Route Frequency Provider Last Rate Last Admin   0.9 %  sodium chloride infusion   Intravenous Continuous Allegra Lai, Loel Dubonnet, MD   Stopped at 09/24/22 1043     Musculoskeletal: Strength & Muscle Tone: within normal limits Gait & Station: normal Patient leans: N/A  Psychiatric Specialty Exam: Review of Systems  Psychiatric/Behavioral:  Positive for sleep disturbance.        Grief    Blood pressure 95/66, pulse 85, temperature 97.7 F (36.5 C), temperature source Skin, height 5\' 2"  (1.575 m), weight 112 lb 6.4 oz (51 kg).Body mass index is 20.56 kg/m.  General Appearance: Casual  Eye Contact:  Fair  Speech:  Clear and Coherent  Volume:  Normal  Mood:   grief  Affect:  Tearful  Thought Process:  Goal Directed and Descriptions of Associations: Intact  Orientation:  Full (Time, Place, and Person)  Thought Content: Logical   Suicidal Thoughts:  No  Homicidal Thoughts:  No  Memory:  Immediate;   Fair Recent;   Fair Remote;   Fair  Judgement:  Fair  Insight:  Fair  Psychomotor Activity:  Normal  Concentration:  Concentration: Fair and Attention Span: Fair  Recall:  [employer] of Knowledge: Fair  Language: Fair  Akathisia:   No  Handed:  Right  AIMS (if indicated): not done  Assets:  Communication Skills Desire for Improvement Social Support  ADL's:  Intact  Cognition: WNL  Sleep:  Poorlikely due to taking care of mom as well as allergies   Screenings: GAD-7    Flowsheet Row Office Visit from 09/23/2022 in [facility] Office Visit from 07/02/2022 in [facility] [employer] Visit from 04/03/2022 in [facility] Office Visit from 03/03/2022 in [facility] [employer] Visit from 02/13/2022 in [facility] [employer]  Total GAD-7  Score 16 15 18 16 17       PHQ2-9    Flowsheet Row Office Visit from 09/23/2022 in [facility] Office Visit from 07/02/2022 in [facility] Visit from 04/03/2022 in [facility] Office Visit from 03/03/2022 in [facility] Visit from 02/13/2022 in [facility] [employer]  PHQ-2 Total Score 4 4 4 4 4   PHQ-9 Total Score 19 10 21 15 14       Flowsheet Row Office Visit from 09/23/2022 in [facility] ED from 08/18/2022 in [facility] at [facility] Video Visit from 05/21/2022 in [facility]  C-SSRS RISK CATEGORY No Risk No Risk No Risk        Assessment and Plan: JACKILYN GROVEN is a 62 year old Caucasian female, married, retired, lives in [place], has a history of anxiety, depression, multiple medical problems, chronic pain, bereavement was evaluated in office today.  Patient is currently struggling with grief, will benefit from the following plan.  Plan GAD-unstable Continue Celexa 40 mg p.o. daily Patient  currently struggling with grief reaction, her mom passed away a couple of days ago. Patient to continue CBT Continue hydroxyzine 12.5-25 mg p.o. twice daily as needed Discontinue BuSpar if the low dosage is not beneficial.  She could reduce the BuSpar to 5 mg for the next couple of days and stop taking it if it does not make a difference.  MDD-unstable Celexa 40 mg p.o. daily Patient to continue CBT with Ms. Felecia Jan for now. Patient's depression symptoms likely due to her grief reaction, she will need more supportive psychotherapy and grief counseling.  Insomnia-currently due to seasonal allergies-unstable Patient will need management of her medical condition, agrees to get back on her antihistamine Zolpidem extended release 12.5 mg p.o. nightly for now Reviewed [place] PMP AWARxE Patient to work on sleep hygiene  Bereavement-unstable Provided grief counseling.  Patient to continue to follow-up with Ms. Felecia Jan Start Klonopin 0.25-0.5 mg daily as needed for severe anxiety/grief reaction Reviewed [place] PMP AWARxE Provided education about long-term risk of being on benzodiazepine therapy..  I have reviewed labs-sodium level reviewed 08/18/2022-within normal limits.   Collaboration of Care: Collaboration of Care: Referral or follow-up with counselor/therapist AEB patient encouraged to continue CBT with Lauralyn Primes will coordinate care.  Patient/Guardian was advised Release of Information must be obtained prior to any record release in order to collaborate their care with an outside provider. Patient/Guardian was advised if they have not already done so to contact the registration department to sign all necessary forms in order for [place] to release information regarding their care.   Consent: Patient/Guardian gives verbal consent for treatment and assignment of benefits for services provided during this visit. Patient/Guardian expressed understanding and agreed to proceed.    Follow-up in clinic in 4 weeks or sooner if needed.  This note was generated in part or whole with voice recognition software. Voice recognition is usually quite accurate but there are transcription errors that can and very often do occur. I apologize for any typographical errors that were not detected and corrected.    Jomarie Longs, MD 09/24/2022, 2:52 PM

## 2022-09-23 NOTE — Patient Instructions (Signed)
 Clonazepam Tablets What is this medication? CLONAZEPAM (kloe NA ze pam) treats seizures. It may also be used to treat panic disorder. It works by Education administrator system calm down. It belongs to a group of medications called benzodiazepines. This medicine may be used for other purposes; ask your health care provider or pharmacist if you have questions. COMMON BRAND NAME(S): Ceberclon, Klonopin What should I tell my care team before I take this medication? They need to know if you have any of these conditions: Bipolar disorder, depression, psychosis, or other mental health conditions Glaucoma Kidney disease Liver disease Lung or breathing disease Myasthenia gravis Parkinson disease Porphyria Seizures or a history of seizures Substance use disorder Suicidal thoughts, plans, or attempt by you or a family member An unusual or allergic reaction to clonazepam, other medications, foods, dyes, or preservatives Pregnant or trying to get pregnant Breastfeeding How should I use this medication? Take this medication by mouth with water. Take it as directed on the prescription label at the same time every day. You can take it with or without food. If it upsets your stomach, take it with food. Do not take it more often than directed. Do not stop taking or change the dose except on the advice of your care team. A special MedGuide will be given to you by the pharmacist with each prescription and refill. Be sure to read this information carefully each time. Talk to your care team about the use of this medication in children. Special care may be needed. Overdosage: If you think you have taken too much of this medicine contact a poison control center or emergency room at once. NOTE: This medicine is only for you. Do not share this medicine with others. What if I miss a dose? If you miss a dose, take it as soon as you can. If it is almost time for your next dose, take only that dose. Do not take double  or extra doses. What may interact with this medication? Do not take this medication with any of the following: Opioid medications for cough Sodium oxybate This medication may also interact with the following: Alcohol Antihistamines for allergy, cough, and cold Antiviral medications for HIV or AIDS Certain medications for anxiety or sleep Certain medications for depression, such as amitriptyline, fluoxetine, sertraline Certain medications for fungal infections, such as ketoconazole and itraconazole Certain medications for seizures, such as carbamazepine, phenobarbital, phenytoin, primidone General anesthetics, such as halothane, isoflurane, methoxyflurane, propofol Local anesthetics, such as lidocaine, pramoxine, tetracaine Medications that relax muscles for surgery Opioid medications for pain Phenothiazines, such as chlorpromazine, mesoridazine, prochlorperazine, thioridazine This list may not describe all possible interactions. Give your health care provider a list of all the medicines, herbs, non-prescription drugs, or dietary supplements you use. Also tell them if you smoke, drink alcohol, or use illegal drugs. Some items may interact with your medicine. What should I watch for while using this medication? Visit your care team for regular checks on your progress. Tell your care team if your symptoms do not start to get better or if they get worse. Do not suddenly stop taking this medication. You may develop a severe reaction. Your care team will tell you how much medication to take. If your care team wants you to stop the medication, the dose may be slowly lowered over time to avoid any side effects. This medication may affect your coordination, reaction time, or judgment. Do not drive or operate machinery until you know how this medication affects you.  Sit up or stand slowly to reduce the risk of dizzy or fainting spells. Drinking alcohol with this medication can increase the risk of these  side effects. If you are taking another medication that also causes drowsiness, you may have more side effects. Give your care team a list of all medications you use. Your care team will tell you how much medication to take. Do not take more medication than directed. Get emergency help right away if you have problems breathing or unusual sleepiness. If you or your family notice any changes in your behavior, such as new or worsening depression, thoughts of harming yourself, anxiety, other unusual or disturbing thoughts, or memory loss, call your care team right away. Talk to your care team if you wish to become pregnant or think you might be pregnant. This medication can cause serious birth defects if taken during pregnancy. Talk to your care team before breastfeeding. Changes to your treatment plan may be needed. What side effects may I notice from receiving this medication? Side effects that you should report to your care team as soon as possible: Allergic reactions--skin rash, itching, hives, swelling of the face, lips, tongue, or throat CNS depression--slow or shallow breathing, shortness of breath, feeling faint, dizziness, confusion, trouble staying awake Thoughts of suicide or self-harm, worsening mood, feelings of depression Side effects that usually do not require medical attention (report to your care team if they continue or are bothersome): Dizziness Drowsiness Headache This list may not describe all possible side effects. Call your doctor for medical advice about side effects. You may report side effects to FDA at 1-800-FDA-1088. Where should I keep my medication? Keep out of the reach of children and pets. This medication can be abused. Keep your medication in a safe place to protect it from theft. Do not share this medication with anyone. Selling or giving away this medication is dangerous and against the law. Store at room temperature between 15 and 30 degrees C (59 and 86 degrees F).  Protect from light. Keep container tightly closed. This medication may cause accidental overdose and death if taken by other adults, children, or pets. Mix any unused medication with a substance like cat litter or coffee grounds. Then throw the medication away in a sealed container like a sealed bag or a coffee can with a lid. Do not use the medication after the expiration date. NOTE: This sheet is a summary. It may not cover all possible information. If you have questions about this medicine, talk to your doctor, pharmacist, or health care provider.  2024 Elsevier/Gold Standard (2021-10-02 00:00:00)

## 2022-09-24 ENCOUNTER — Ambulatory Visit: Payer: BC Managed Care – PPO | Admitting: Certified Registered"

## 2022-09-24 ENCOUNTER — Ambulatory Visit
Admission: RE | Admit: 2022-09-24 | Discharge: 2022-09-24 | Disposition: A | Discharge status: Home / Self Care | Payer: BC Managed Care – PPO | Attending: Gastroenterology | Admitting: Gastroenterology

## 2022-09-24 ENCOUNTER — Encounter
Admission: RE | Disposition: A | Discharge status: Home / Self Care | Payer: Self-pay | Source: Home / Self Care | Attending: Gastroenterology

## 2022-09-24 DIAGNOSIS — R1314 Dysphagia, pharyngoesophageal phase: Secondary | ICD-10-CM | POA: Diagnosis not present

## 2022-09-24 DIAGNOSIS — Z7985 Long-term (current) use of injectable non-insulin antidiabetic drugs: Secondary | ICD-10-CM | POA: Diagnosis not present

## 2022-09-24 DIAGNOSIS — E1142 Type 2 diabetes mellitus with diabetic polyneuropathy: Secondary | ICD-10-CM | POA: Diagnosis not present

## 2022-09-24 DIAGNOSIS — K589 Irritable bowel syndrome without diarrhea: Secondary | ICD-10-CM | POA: Diagnosis not present

## 2022-09-24 DIAGNOSIS — F32A Depression, unspecified: Secondary | ICD-10-CM | POA: Insufficient documentation

## 2022-09-24 DIAGNOSIS — Z8616 Personal history of COVID-19: Secondary | ICD-10-CM | POA: Insufficient documentation

## 2022-09-24 DIAGNOSIS — R1319 Other dysphagia: Secondary | ICD-10-CM

## 2022-09-24 DIAGNOSIS — I10 Essential (primary) hypertension: Secondary | ICD-10-CM | POA: Insufficient documentation

## 2022-09-24 DIAGNOSIS — Z9884 Bariatric surgery status: Secondary | ICD-10-CM | POA: Insufficient documentation

## 2022-09-24 DIAGNOSIS — K760 Fatty (change of) liver, not elsewhere classified: Secondary | ICD-10-CM | POA: Diagnosis not present

## 2022-09-24 DIAGNOSIS — K224 Dyskinesia of esophagus: Secondary | ICD-10-CM | POA: Diagnosis not present

## 2022-09-24 DIAGNOSIS — E785 Hyperlipidemia, unspecified: Secondary | ICD-10-CM | POA: Diagnosis not present

## 2022-09-24 DIAGNOSIS — F419 Anxiety disorder, unspecified: Secondary | ICD-10-CM | POA: Insufficient documentation

## 2022-09-24 HISTORY — PX: ESOPHAGOGASTRODUODENOSCOPY (EGD) WITH PROPOFOL: SHX5813

## 2022-09-24 LAB — GLUCOSE, CAPILLARY: Glucose-Capillary: 96 mg/dL (ref 70–99)

## 2022-09-24 SURGERY — ESOPHAGOGASTRODUODENOSCOPY (EGD) WITH PROPOFOL
Anesthesia: General

## 2022-09-24 MED ORDER — SODIUM CHLORIDE 0.9 % IV SOLN
INTRAVENOUS | Status: DC
  Administered 2022-09-24: 20 mL/h via INTRAVENOUS

## 2022-09-24 MED ORDER — LIDOCAINE HCL (CARDIAC) PF 100 MG/5ML IV SOSY
PREFILLED_SYRINGE | INTRAVENOUS | Status: DC | PRN
  Administered 2022-09-24 (×3): 100 mg via INTRAVENOUS

## 2022-09-24 MED ORDER — PROPOFOL 10 MG/ML IV BOLUS
INTRAVENOUS | Status: DC | PRN
  Administered 2022-09-24 (×2): 50 mg via INTRAVENOUS

## 2022-09-24 NOTE — Op Note (Signed)
[facility] Patient Name: Seresa Moise Procedure Date: 09/24/2022 10:08 AM MRN: [id number] Account #: [id number] Date of Birth: Aug 17, 1960 Admit Type: Outpatient Age: 62 Room: [facility] ENDO ROOM 4 Gender: Female Note Status: Finalized Instrument Name: Upper Endoscope [id number] Procedure:             Upper GI endoscopy Indications:           Esophageal dysphagia Providers:             Toney Reil MD, MD Medicines:             General Anesthesia Complications:         No immediate complications. Estimated blood loss: None. Procedure:             Pre-Anesthesia Assessment:                        - Prior to the procedure, a History and Physical was                         performed, and patient medications and allergies were                         reviewed. The patient is competent. The risks and                         benefits of the procedure and the sedation options and                         risks were discussed with the patient. All questions                         were answered and informed consent was obtained.                         Patient identification and proposed procedure were                         verified by the physician, the nurse, the                         anesthesiologist, the anesthetist and the technician                         in the pre-procedure area in the procedure room in the                         endoscopy suite. Mental Status Examination: alert and                         oriented. Airway Examination: normal oropharyngeal                         airway and neck mobility. Respiratory Examination:                         clear to auscultation. CV Examination: normal.                         Prophylactic Antibiotics: The  patient does not require                         prophylactic antibiotics. Prior Anticoagulants: The                         patient has taken no anticoagulant or antiplatelet                          agents. ASA Grade Assessment: II - A patient with mild                         systemic disease. After reviewing the risks and                         benefits, the patient was deemed in satisfactory                         condition to undergo the procedure. The anesthesia                         plan was to use general anesthesia. Immediately prior                         to administration of medications, the patient was                         re-assessed for adequacy to receive sedatives. The                         heart rate, respiratory rate, oxygen saturations,                         blood pressure, adequacy of pulmonary ventilation, and                         response to care were monitored throughout the                         procedure. The physical status of the patient was                         re-assessed after the procedure.                        After obtaining informed consent, the endoscope was                         passed under direct vision. Throughout the procedure,                         the patient's blood pressure, pulse, and oxygen                         saturations were monitored continuously. The Endoscope                         was introduced through the mouth, and advanced to the  second part of duodenum. The upper GI endoscopy was                         accomplished without difficulty. The patient tolerated                         the procedure well. Findings:      The duodenal bulb and second portion of the duodenum were normal.      Evidence of a sleeve gastrectomy was found in the gastric body. This was       characterized by healthy appearing mucosa.      The gastroesophageal junction and examined esophagus were normal. A TTS       dilator was passed through the scope. Due to hypertensive LES on       esophageal manometry, Emperic Dilation with a 12-13.5-15 mm x 5.5 cm CRE       balloon and an 18-19-20 mm x 5.5  cm CRE balloon dilator was performed to       20 mm. Impression:            - Normal duodenal bulb and second portion of the                         duodenum.                        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                        - Normal gastroesophageal junction and esophagus.                         Dilated.                        - No specimens collected. Recommendation:        - Discharge patient to home (with escort).                        - Resume previous diet today.                        - Continue present medications. 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 All rights reserved. The codes documented in this report are preliminary and upon coder review may  be revised to meet current compliance requirements. Dr. Libby Maw Toney Reil MD, MD 09/24/2022 10:45:19 AM This report has been signed electronically. Number of Addenda: 0 Note Initiated On: 09/24/2022 10:08 AM Estimated Blood Loss:  Estimated blood loss: none. Estimated blood loss: none.      [facility]

## 2022-09-24 NOTE — Transfer of Care (Signed)
Immediate Anesthesia Transfer of Care Note  Patient: Kristin [name]  Procedure(s) Performed: ESOPHAGOGASTRODUODENOSCOPY (EGD) WITH PROPOFOL ESOPHAGEAL DILITATION  Patient Location: PACU  Anesthesia Type:General  Level of Consciousness: awake, drowsy, and patient cooperative  Airway & Oxygen Therapy: Patient Spontanous Breathing  Post-op Assessment: Report given to RN and Post -op Vital signs reviewed and stable  Post vital signs: stable  Last Vitals:  Vitals Value Taken Time  BP    Temp    Pulse 72 09/24/22 1047  Resp 16 09/24/22 1047  SpO2 100 % 09/24/22 1047  Vitals shown include unfiled device data.  Last Pain:  Vitals:   09/24/22 0929  TempSrc: Temporal  PainSc: 4          Complications: No notable events documented.

## 2022-09-24 NOTE — Anesthesia Preprocedure Evaluation (Signed)
Anesthesia Evaluation  Patient identified by MRN, date of birth, ID band Patient awake    Reviewed: Allergy & Precautions, NPO status , Patient's Chart, lab work & pertinent test results  History of Anesthesia Complications (+) PONV and history of anesthetic complications  Airway Mallampati: II  TM Distance: >3 FB Neck ROM: Full    Dental no notable dental hx. (+) Teeth Intact   Pulmonary neg pulmonary ROS, neg sleep apnea, neg COPD, Patient abstained from smoking.Not current smoker   Pulmonary exam normal breath sounds clear to auscultation       Cardiovascular Exercise Tolerance: Good METShypertension, (-) CAD and (-) Past MI (-) dysrhythmias  Rhythm:Regular Rate:Normal - Systolic murmurs    Neuro/Psych  Headaches PSYCHIATRIC DISORDERS Anxiety Depression       GI/Hepatic ,GERD  ,,(+)     (-) substance abuse    Endo/Other  diabetes  GLP1 held over 1 week  Renal/GU negative Renal ROS     Musculoskeletal   Abdominal   Peds  Hematology   Anesthesia Other Findings Past Medical History: No date: ADHD 07/23/2020: Adhesive capsulitis of right shoulder No date: Allergy 08/19/2019: Annual physical exam 12/12/2015: Anxiety No date: Arthritis No date: Basal cell carcinoma     Comment:  nose, left cheek, right upper lip, right posterior               shoulder most recent 6 years ago on left cheek 03/26/2021: Basal cell carcinoma     Comment:  L chest, [facility] 04/24/2021 08/14/2014: Benign essential hypertension No date: Bleeding 12/25/2020: Bursitis of right shoulder No date: Chicken pox No date: Chronic diarrhea 10/20/2017: Chronic intractable headache 08/08/2020: Chronic migraine w/o aura, not intractable, w/o stat migr No date: Complication of anesthesia     Comment:  blood pressure elevated after gastric sleeve surgery in               recovery, pt does not want mask over face; migraines No date: COVID-19      Comment:  03/01/20 had MAB 03/03/20 and felt better next day 03/04/20 04/05/2020: CTS (carpal tunnel syndrome)     Comment:  Dr. Sherryll Burger s/p [facility]/Lidocaine injection 12/05/19 No date: Depression No date: Diabetes mellitus without complication (HCC)     Comment:  pre-diabetic currently, was diabetic prior to gastric               sleeve surgery  08/08/2020: Diabetic polyneuropathy associated with diabetes mellitus  due to underlying condition (HCC) No date: Disorder of right rotator cuff     Comment:  10/03/19 MRI right shoulder abnormal Dr. Zettie Cooley No date: Fall     Comment:  08/2020 No date: Fall     Comment:  03/28/21 No date: Fatty liver 05/06/2017: Fatty liver     Comment:  [place] + 2013  No date: Gout 10/15/2018: Hematuria 06/19/2021: History of shingles     Comment:  lower back and R side of thigh 03/28/2020: Hives No date: Hyperlipidemia No date: Hypertension     Comment:  no problems since losing weight after gastric sleeve               surgery 07/17/2014: Hypertension No date: IBS (irritable bowel syndrome) 06/26/2016: Injury of right wrist No date: Insomnia No date: Migraine No date: Neuropathy     Comment:  bilateral feet No date: Obesity 05/06/2017: Obesity (BMI 30-39.9) 08/18/2019: Obesity, diabetes, and hypertension syndrome (HCC) No date: Osteopenia 08/14/2014: Palpitations No date: PONV (postoperative nausea and vomiting)  03/26/2021: Squamous cell carcinoma of skin     Comment:  SCC IS R mid sternum, [facility] 04/24/2021 12/25/2020: Tendonitis of shoulder, right  Reproductive/Obstetrics                             Anesthesia Physical Anesthesia Plan  ASA: 2  Anesthesia Plan: General   Post-op Pain Management: Minimal or no pain anticipated   Induction: Intravenous  PONV Risk Score and Plan: 4 or greater and Propofol infusion, TIVA and Ondansetron  Airway Management Planned: Nasal Cannula  Additional Equipment: None  Intra-op Plan:    Post-operative Plan:   Informed Consent: I have reviewed the patients History and Physical, chart, labs and discussed the procedure including the risks, benefits and alternatives for the proposed anesthesia with the patient or authorized representative who has indicated his/her understanding and acceptance.     Dental advisory given  Plan Discussed with: CRNA and Surgeon  Anesthesia Plan Comments: (Discussed risks of anesthesia with patient, including possibility of difficulty with spontaneous ventilation under anesthesia necessitating airway intervention, PONV, and rare risks such as cardiac or respiratory or neurological events, and allergic reactions. Discussed the role of CRNA in patient's perioperative care. Patient understands.)       Anesthesia Quick Evaluation

## 2022-09-24 NOTE — Anesthesia Postprocedure Evaluation (Signed)
Anesthesia Post Note  Patient: Kristin [name]  Procedure(s) Performed: ESOPHAGOGASTRODUODENOSCOPY (EGD) WITH PROPOFOL ESOPHAGEAL DILITATION  Patient location during evaluation: Endoscopy Anesthesia Type: General Level of consciousness: awake and alert Pain management: pain level controlled Vital Signs Assessment: post-procedure vital signs reviewed and stable Respiratory status: spontaneous breathing, nonlabored ventilation, respiratory function stable and patient connected to nasal cannula oxygen Cardiovascular status: blood pressure returned to baseline and stable Postop Assessment: no apparent nausea or vomiting Anesthetic complications: no  There were no known notable events for this encounter.   Last Vitals:  Vitals:   09/24/22 1046 09/24/22 1102  BP: (!) 149/66 138/72  Pulse:    Resp:    Temp:    SpO2:      Last Pain:  Vitals:   09/24/22 1102  TempSrc:   PainSc: 0-No pain                 Stephanie Coup

## 2022-09-24 NOTE — H&P (Signed)
Arlyss Repress, MD [address]  [address]  [place], [place] [address]  Main: [phone]  Fax: [phone] Pager: [phone]  Primary Care Physician:  Dana Allan, MD Primary Gastroenterologist:  Dr. Arlyss Repress  Pre-Procedure History & Physical: HPI:  Kristin [name] is a 62 y.o. female is here for an endoscopy.   Past Medical History:  Diagnosis Date   ADHD    Adhesive capsulitis of right shoulder 07/23/2020   Allergy    Annual physical exam 08/19/2019   Anxiety 12/12/2015   Arthritis    Basal cell carcinoma    nose, left cheek, right upper lip, right posterior shoulder most recent 6 years ago on left cheek   Basal cell carcinoma 03/26/2021   L chest, EDC 04/24/2021   Benign essential hypertension 08/14/2014   Bleeding    Bursitis of right shoulder 12/25/2020   Chicken pox    Chronic diarrhea    Chronic intractable headache 10/20/2017   Chronic migraine w/o aura, not intractable, w/o stat migr 08/08/2020   Complication of anesthesia    blood pressure elevated after gastric sleeve surgery in recovery, pt does not want mask over face; migraines   COVID-19    03/01/20 had MAB 03/03/20 and felt better next day 03/04/20   CTS (carpal tunnel syndrome) 04/05/2020   Dr. Sherryll Burger s/p [facility]/Lidocaine injection 12/05/19   Depression    Diabetes mellitus without complication (HCC)    pre-diabetic currently, was diabetic prior to gastric sleeve surgery    Diabetic polyneuropathy associated with diabetes mellitus due to underlying condition (HCC) 08/08/2020   Disorder of right rotator cuff    10/03/19 MRI right shoulder abnormal Dr. Zettie Cooley   Fall    08/2020   Fall    03/28/21   Fatty liver    Fatty liver 05/06/2017   [place] + 2013    Gout    Hematuria 10/15/2018   History of shingles 06/19/2021   lower back and R side of thigh   Hives 03/28/2020   Hyperlipidemia    Hypertension    no problems since losing weight after gastric sleeve surgery   Hypertension  07/17/2014   IBS (irritable bowel syndrome)    Injury of right wrist 06/26/2016   Insomnia    Migraine    Neuropathy    bilateral feet   Obesity    Obesity (BMI 30-39.9) 05/06/2017   Obesity, diabetes, and hypertension syndrome (HCC) 08/18/2019   Osteopenia    Palpitations 08/14/2014   PONV (postoperative nausea and vomiting)    Squamous cell carcinoma of skin 03/26/2021   SCC IS R mid sternum, EDC 04/24/2021   Tendonitis of shoulder, right 12/25/2020    Past Surgical History:  Procedure Laterality Date   ABDOMINAL HYSTERECTOMY     no h/o abnormal pap still has 1 ovary left   ANKLE ARTHROSCOPY Left 05/29/2016   Procedure: ANKLE ARTHROSCOPY;  Surgeon: Felecia Shelling, DPM;  Location: [facility];  Service: Podiatry;  Laterality: Left;   APPENDECTOMY     basal cell removed     CESAREAN SECTION N/A    1985 & 1995   CHOLECYSTECTOMY     CLOSED MANIPULATION SHOULDER WITH STERIOD INJECTION Right 03/31/2022   Procedure: MANIPULATION UNDER ANESTHESIA WITH GLENOHUMERAL AND SUBACROMIAL CORTICOSTERIOD INJECTION;  Surgeon: Signa Kell, MD;  Location: [facility] ORS;  Service: Orthopedics;  Laterality: Right;   COLONOSCOPY WITH PROPOFOL N/A 01/31/2019   Procedure: COLONOSCOPY WITH PROPOFOL;  Surgeon: Toney Reil, MD;  Location:  [facility] ENDOSCOPY;  Service: Gastroenterology;  Laterality: N/A;   cyst removed from wrist     ESOPHAGOGASTRODUODENOSCOPY (EGD) WITH PROPOFOL N/A 01/31/2019   Procedure: ESOPHAGOGASTRODUODENOSCOPY (EGD) WITH PROPOFOL;  Surgeon: Toney Reil, MD;  Location: [facility] ENDOSCOPY;  Service: Gastroenterology;  Laterality: N/A;   ESOPHAGOGASTRODUODENOSCOPY (EGD) WITH PROPOFOL N/A 05/20/2022   Procedure: ESOPHAGOGASTRODUODENOSCOPY (EGD) WITH PROPOFOL;  Surgeon: Toney Reil, MD;  Location: [facility] ENDOSCOPY;  Service: Gastroenterology;  Laterality: N/A;   FLEXIBLE SIGMOIDOSCOPY N/A 05/21/2020   Procedure: FLEXIBLE SIGMOIDOSCOPY;  Surgeon: Toney Reil, MD;  Location: [facility]  ENDOSCOPY;  Service: Gastroenterology;  Laterality: N/A;   FOOT SURGERY     left foot 11/2016 Dr. Vinnie Level    left elbow surgery     07/2017 Dr Erin Sons epicondylitis    LYSIS OF ADHESION Right 08/27/2021   Procedure: Right shoulder arthroscopic lysis of adhesions, capsular release, and manipulation under anesthesia with corticosteroid injection and biceps tenodesis;  Surgeon: Signa Kell, MD;  Location: [facility];  Service: Orthopedics;  Laterality: Right;   MOHS SURGERY     x 2 face nose and left cheek    OSTEOCHONDROMA EXCISION Left 05/29/2016   Procedure: OSTEOCHONDRAL DRILLING TALUS;  Surgeon: Felecia Shelling, DPM;  Location: [facility] OR;  Service: Podiatry;  Laterality: Left;   PLANTAR FASCIA RELEASE Left 05/29/2016   Procedure: ENDOSCOPIC PLANTAR FASCIOTOMY;  Surgeon: Felecia Shelling, DPM;  Location: [facility] OR;  Service: Podiatry;  Laterality: Left;   ROTATOR CUFF REPAIR Left    SHOULDER ARTHROSCOPY WITH BICEPSTENOTOMY Right 03/31/2022   Procedure: SHOULDER ARTHROSCOPY WITH BICEPS TENOTOMY, ARTHROSCOPIC LYSIS OF ADHESIONS, AND CAPSULAR RELEASE, AND EXTENSIVE GLENOHUMERAL DEBRIDEMENT;  Surgeon: Signa Kell, MD;  Location: [facility] ORS;  Service: Orthopedics;  Laterality: Right;   SHOULDER ARTHROSCOPY WITH ROTATOR CUFF REPAIR  03/31/2022   Procedure: SHOULDER ARTHROSCOPY WITH ROTATOR CUFF REPAIR;  Surgeon: Signa Kell, MD;  Location: [facility] ORS;  Service: Orthopedics;;   STOMACH SURGERY  2017   gastric sleeve surgery   TENNIS ELBOW RELEASE/NIRSCHEL PROCEDURE Left 08/05/2017   Procedure: TENNIS ELBOW RELEASE/NIRSCHEL PROCEDURE;  Surgeon: Erin Sons, MD;  Location: [facility] OR[facility];  Service: Orthopedics;  Laterality: Left;   TUBAL LIGATION     wedge resection of ovary  1982    Prior to Admission medications   Medication Sig Start Date End Date Taking? Authorizing Provider  acetaminophen (TYLENOL) 500 MG tablet Take 2 tablets (1,000 mg total) by mouth every 8 (eight) hours. 03/31/22  03/31/23 Yes Signa Kell, MD  busPIRone (BUSPAR) 10 MG tablet Take 1 tablet (10 mg total) by mouth daily. 09/23/22  Yes Jomarie Longs, MD  Calcium Citrate-Vitamin D 500-10 MG-MCG CHEW Take 1 tablet two times a day 01/27/22  Yes Dana Allan, MD  citalopram (CELEXA) 40 MG tablet TAKE 1 TABLET BY MOUTH EVERY DAY 08/15/22  Yes Eappen, Levin Bacon, MD  clonazePAM (KLONOPIN) 0.5 MG tablet Take 0.5-1 tablets (0.25-0.5 mg total) by mouth daily as needed for anxiety. Limited supply 09/23/22  Yes Eappen, Levin Bacon, MD  Cyanocobalamin (VITAMIN B-12) 1000 MCG SUBL Place 1 tablet (1,000 mcg total) under the tongue daily. 01/14/22  Yes Dana Allan, MD  fexofenadine (ALLEGRA) 180 MG tablet Take 1 tablet (180 mg total) by mouth daily as needed for allergies or rhinitis. 02/28/22  Yes Dana Allan, MD  gabapentin (NEURONTIN) 300 MG capsule TAKE 1 CAPSULE BY MOUTH 3 TIMES DAILY AS NEEDED. 03/05/22  Yes Lomax, Amy, NP  hydrOXYzine (ATARAX) 25 MG tablet TAKE 0.5-1 TABLETS (12.5-25  MG TOTAL) BY MOUTH 3 (THREE) TIMES DAILY AS NEEDED FOR ANXIETY (AND SLEEP). 08/13/22  Yes Eappen, Levin Bacon, MD  montelukast (SINGULAIR) 10 MG tablet Take 1 tablet (10 mg total) by mouth at bedtime. 04/10/22  Yes Bethanie Dicker, NP  omeprazole (PRILOSEC) 40 MG capsule Take 1 capsule (40 mg total) by mouth daily. 07/07/22  Yes [place], Loel Dubonnet, MD  ondansetron (ZOFRAN-ODT) 4 MG disintegrating tablet Take 1 tablet (4 mg total) by mouth every 8 (eight) hours as needed. for nausea 03/05/22  Yes Lomax, Amy, NP  Rimegepant Sulfate (NURTEC) 75 MG TBDP Take 1 tablet (75 mg total) by mouth daily as needed (take for abortive therapy of migraine, no more than 1 tablet in 24 hours or 10 per month). 03/05/22  Yes Lomax, Amy, NP  rosuvastatin (CRESTOR) 20 MG tablet Take 1 tablet (20 mg total) by mouth daily. 07/02/22  Yes Dana Allan, MD  tirzepatide [facility]) 2.5 MG/0.5ML Pen INJECT 2.5 MG SUBCUTANEOUSLY WEEKLY 03/05/22  Yes Dana Allan, MD  topiramate (TOPAMAX) 50 MG  tablet Take 1 tablet (50 mg total) by mouth daily. 03/05/22  Yes Lomax, Amy, NP  zolpidem (AMBIEN CR) 6.25 MG CR tablet Take 2 tablets (12.5 mg total) by mouth at bedtime as needed for sleep. 09/18/22  Yes Dana Allan, MD  celecoxib (CELEBREX) 200 MG capsule Take by mouth. 09/03/22 10/03/22  [provider]    Allergies as of 08/18/2022 - Review Complete 08/18/2022  Allergen Reaction Noted   Tetracyclines & related Swelling and Rash 06/04/2014   Amoxicillin  12/10/2018   Alcohol Rash 12/07/2014   Azithromycin Rash 07/19/2018   Metoprolol Diarrhea 12/07/2014    Family History  Problem Relation Age of Onset   Heart disease Mother    Breast cancer Mother 21       metastatic, now hip, spine, and leg    Arthritis Mother    Hyperlipidemia Mother    Cancer Mother        spinal surgery. breast dx'ed age 43 now metastatic 58 as of 07/13/18   Cystic fibrosis Mother        carrier    Heart disease Father    Lung cancer Father        smoker   Hyperlipidemia Father    Hypertension Father    Depression Sister    Diabetes Sister    Stroke Sister    Other Sister        covid +   Diabetes Sister    Diabetes Maternal Aunt    Alcohol abuse Maternal Aunt    Diabetes Maternal Aunt    Alcohol abuse Maternal Uncle    Diabetes Maternal Uncle    Alcohol abuse Paternal Uncle    Diabetes Maternal Grandfather    Hyperlipidemia Maternal Grandfather    Arthritis Maternal Grandmother    Sudden death Cousin    Schizophrenia Cousin    Cystic fibrosis Other        great niece    Factor V Leiden deficiency Other        uncle and cousins and m GF (all maternal)   Alcohol abuse Niece    Alcohol abuse Nephew    ADD / ADHD Son    Depression Son    ADD / ADHD Son    Post-traumatic stress disorder Son    Colon cancer Neg Hx    Ovarian cancer Neg Hx     Social History   Socioeconomic History   Marital status: Married  Spouse name: Not on file   Number of children: 3   Years of  education: Not on file   Highest education level: Some college, no degree  Occupational History   Not on file  Tobacco Use   Smoking status: Never   Smokeless tobacco: Never  Vaping Use   Vaping status: Never Used  Substance and Sexual Activity   Alcohol use: Never   Drug use: Never   Sexual activity: Yes    Partners: Male    Birth control/protection: Surgical  Other Topics Concern   Not on file  Social History Narrative   Married x 37 years as of 10/2017    Kids 3 boys youngest is Renae Fickle   Write childrens books    Retired from [employer] 01/2017    Enjoys traveling    Right handed   Caffeine: quit 2009   Social Determinants of Health   Financial Resource Strain: Low Risk  (07/01/2022)   Overall Financial Resource Strain (CARDIA)    Difficulty of Paying Living Expenses: Not very hard  Food Insecurity: No Food Insecurity (08/22/2022)   Hunger Vital Sign    Worried About Running Out of Food in the Last Year: Never true    Ran Out of Food in the Last Year: Never true  Transportation Needs: No Transportation Needs (08/22/2022)   PRAPARE - Administrator, Civil Service (Medical): No    Lack of Transportation (Non-Medical): No  Physical Activity: Insufficiently Active (07/01/2022)   Exercise Vital Sign    Days of Exercise per Week: 2 days    Minutes of Exercise per Session: 20 min  Stress: Stress Concern Present (07/01/2022)   [employer] of Occupational Health - Occupational Stress Questionnaire    Feeling of Stress : Very much  Social Connections: Moderately Integrated (07/01/2022)   Social Connection and Isolation Panel [NHANES]    Frequency of Communication with Friends and Family: Three times a week    Frequency of Social Gatherings with Friends and Family: Twice a week    Attends Religious Services: More than 4 times per year    Active Member of [employer] or Organizations: No    Attends Engineer, structural: Not on file    Marital Status: Married   Catering manager Violence: Not on file    Review of Systems: See HPI, otherwise negative ROS  Physical Exam: BP 95/62   Pulse 84   Temp (!) 96.4 F (35.8 C) (Temporal)   Resp 16   Ht 5\' 2"  (1.575 m)   Wt 50.3 kg   SpO2 100%   BMI 20.27 kg/m  General:   Alert,  pleasant and cooperative in NAD Head:  Normocephalic and atraumatic. Neck:  Supple; no masses or thyromegaly. Lungs:  Clear throughout to auscultation.    Heart:  Regular rate and rhythm. Abdomen:  Soft, nontender and nondistended. Normal bowel sounds, without guarding, and without rebound.   Neurologic:  Alert and  oriented x4;  grossly normal neurologically.  Impression/Plan: Kristin [name] is here for an endoscopy to be performed for dysphagia, hypertensive upper esophageal sphincter and hypertensive lower esophageal sphincter   Risks, benefits, limitations, and alternatives regarding  endoscopy have been reviewed with the patient.  Questions have been answered.  All parties agreeable.   Lannette Donath, MD  09/24/2022, 10:06 AM

## 2022-09-25 ENCOUNTER — Telehealth (INDEPENDENT_AMBULATORY_CARE_PROVIDER_SITE_OTHER): Payer: BC Managed Care – PPO | Admitting: Nurse Practitioner

## 2022-09-25 ENCOUNTER — Encounter: Payer: Self-pay | Admitting: Gastroenterology

## 2022-09-25 VITALS — BP 127/69 | HR 61 | Temp 97.6°F | Ht 62.0 in | Wt 110.0 lb

## 2022-09-25 DIAGNOSIS — U071 COVID-19: Secondary | ICD-10-CM

## 2022-09-25 MED ORDER — PREDNISONE 10 MG PO TABS: ORAL_TABLET | ORAL | 0 refills | Status: DC

## 2022-09-25 MED ORDER — FLUTICASONE PROPIONATE 50 MCG/ACT NA SUSP: 2.0000 | Freq: Every day | NASAL | 6 refills | Status: DC

## 2022-09-25 NOTE — Progress Notes (Signed)
Virtual Visit via Video Note  I connected with Kristin [name] on 09/25/2022 at 4:40 PM by a video enabled telemedicine application and verified that I am speaking with the correct person using two identifiers.  Patient Location: Home Provider Location: Office/Clinic  I discussed the limitations, risks, security, and privacy concerns of performing an evaluation and management service by video and the availability of in person appointments. I also discussed with the patient that there may be a patient responsible charge related to this service. The patient expressed understanding and agreed to proceed.  Subjective: PCP: Dana Allan, MD  Chief Complaint  Patient presents with   Acute Visit    Covid positive on 09/25/22   HPI Patient is seen due to positive COVID home test today. The associated symptoms include: That cold with headache, stuffy and runny nose. She denies fever, body aches, shortness of breath  Medication used: Mucinex, Advil Cold and Sinus  ROS: Per HPI  Current Outpatient Medications:    acetaminophen (TYLENOL) 500 MG tablet, Take 2 tablets (1,000 mg total) by mouth every 8 (eight) hours., Disp: 90 tablet, Rfl: 2   Calcium Citrate-Vitamin D 500-10 MG-MCG CHEW, Take 1 tablet two times a day, Disp: , Rfl:    citalopram (CELEXA) 40 MG tablet, TAKE 1 TABLET BY MOUTH EVERY DAY, Disp: 90 tablet, Rfl: 0   clonazePAM (KLONOPIN) 0.5 MG tablet, Take 0.5-1 tablets (0.25-0.5 mg total) by mouth daily as needed for anxiety. Limited supply, Disp: 10 tablet, Rfl: 0   Cyanocobalamin (VITAMIN B-12) 1000 MCG SUBL, Place 1 tablet (1,000 mcg total) under the tongue daily., Disp: 90 tablet, Rfl: 3   fexofenadine (ALLEGRA) 180 MG tablet, Take 1 tablet (180 mg total) by mouth daily as needed for allergies or rhinitis., Disp: 90 tablet, Rfl: 3   fluticasone (FLONASE) 50 MCG/ACT nasal spray, Place 2 sprays into both nostrils daily., Disp: 16 g, Rfl: 6   gabapentin (NEURONTIN) 300 MG  capsule, TAKE 1 CAPSULE BY MOUTH 3 TIMES DAILY AS NEEDED., Disp: 90 capsule, Rfl: 5   hydrOXYzine (ATARAX) 25 MG tablet, TAKE 0.5-1 TABLETS (12.5-25 MG TOTAL) BY MOUTH 3 (THREE) TIMES DAILY AS NEEDED FOR ANXIETY (AND SLEEP)., Disp: 90 tablet, Rfl: 1   montelukast (SINGULAIR) 10 MG tablet, Take 1 tablet (10 mg total) by mouth at bedtime., Disp: 90 tablet, Rfl: 3   omeprazole (PRILOSEC) 40 MG capsule, Take 1 capsule (40 mg total) by mouth daily., Disp: 90 capsule, Rfl: 3   ondansetron (ZOFRAN-ODT) 4 MG disintegrating tablet, Take 1 tablet (4 mg total) by mouth every 8 (eight) hours as needed. for nausea, Disp: 20 tablet, Rfl: 2   predniSONE (DELTASONE) 10 MG tablet, Take 4 tablets ( total 40 mg) by mouth for 2 days; take 3 tablets ( total 30 mg) by mouth for 2 days; take 2 tablets ( total 20 mg) by mouth for 1 day; take 1 tablet ( total 10 mg) by mouth for 1 day., Disp: 17 tablet, Rfl: 0   Rimegepant Sulfate (NURTEC) 75 MG TBDP, Take 1 tablet (75 mg total) by mouth daily as needed (take for abortive therapy of migraine, no more than 1 tablet in 24 hours or 10 per month)., Disp: 10 tablet, Rfl: 2   rosuvastatin (CRESTOR) 20 MG tablet, Take 1 tablet (20 mg total) by mouth daily., Disp: 90 tablet, Rfl: 2   tirzepatide (MOUNJARO) 2.5 MG/0.5ML Pen, INJECT 2.5 MG SUBCUTANEOUSLY WEEKLY, Disp: 6 mL, Rfl: 1   topiramate (TOPAMAX) 50 MG  tablet, Take 1 tablet (50 mg total) by mouth daily., Disp: 90 tablet, Rfl: 3   zolpidem (AMBIEN CR) 6.25 MG CR tablet, Take 2 tablets (12.5 mg total) by mouth at bedtime as needed for sleep., Disp: 120 tablet, Rfl: 3  Observations/Objective: Today's Vitals   09/25/22 1631  BP: 127/69  Pulse: 61  Temp: 97.6 F (36.4 C)  Weight: 110 lb (49.9 kg)  Height: 5\' 2"  (1.575 m)   Physical Exam Constitutional:      General: She is not in acute distress.    Appearance: Normal appearance. She is not ill-appearing.  Eyes:     Conjunctiva/sclera: Conjunctivae normal.  Pulmonary:      Effort: No respiratory distress.  Neurological:     Mental Status: She is alert.  Psychiatric:        Mood and Affect: Mood normal.        Behavior: Behavior normal.        Thought Content: Thought content normal.        Judgment: Judgment normal.     Assessment and Plan: COVID-19 Assessment & Plan: Given patient symptoms will treat with prednisone tapering and Flonase nasal spray. Advised to continue plain Mucinex and Tylenol for pain and fever relief. Adequate hydration and rest.  Can use warm water with honey and lime.   Other orders -     predniSONE; Take 4 tablets ( total 40 mg) by mouth for 2 days; take 3 tablets ( total 30 mg) by mouth for 2 days; take 2 tablets ( total 20 mg) by mouth for 1 day; take 1 tablet ( total 10 mg) by mouth for 1 day.  Dispense: 17 tablet; Refill: 0 -     Fluticasone Propionate; Place 2 sprays into both nostrils daily.  Dispense: 16 g; Refill: 6    Follow Up Instructions: Return if symptoms worsen or fail to improve.   I discussed the assessment and treatment plan with the patient. The patient was provided an opportunity to ask questions, and all were answered. The patient agreed with the plan and demonstrated an understanding of the instructions.   The patient was advised to call back or seek an in-person evaluation if the symptoms worsen or if the condition fails to improve as anticipated.  The above assessment and management plan was discussed with the patient. The patient verbalized understanding of and has agreed to the management plan.   Kara Dies, NP

## 2022-09-30 IMAGING — MR MR SHOULDER*R* W/O CM
4 of 5 series · 30 of 40 positions shown · non-contrast
Comparison: None.

CLINICAL DATA: Right shoulder pain.  Limited range of motion.

EXAM:
MRI OF THE RIGHT SHOULDER WITHOUT CONTRAST
TECHNIQUE: Multiplanar, multisequence MR imaging of the shoulder was performed.
No intravenous contrast was administered.

[Series 5: T2 fat-sat · axial · right · 4.0mm · 0.44mm/px · z∈[-57,+34]mm · 8 of 20 slices shown (1 of 3)]
[im 1/20]
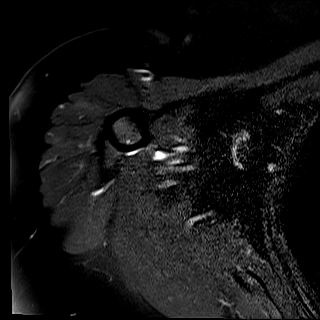
[im 3/20]
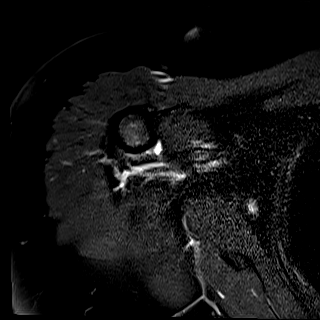
[im 6/20]
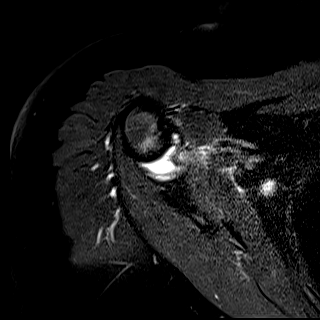
[im 9/20]
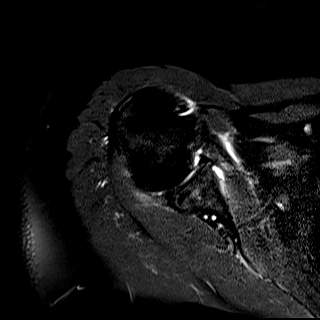
[im 11/20]
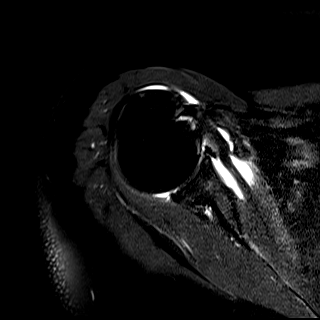
[im 14/20]
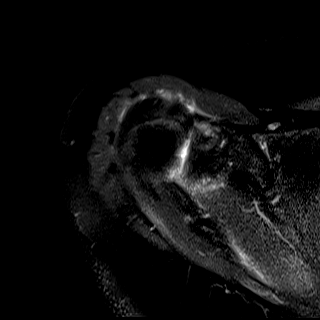
[im 17/20]
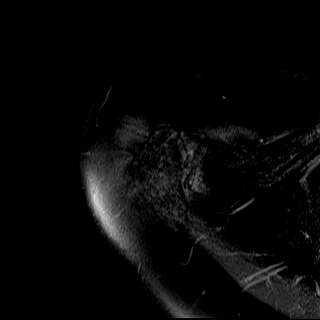
[im 20/20]
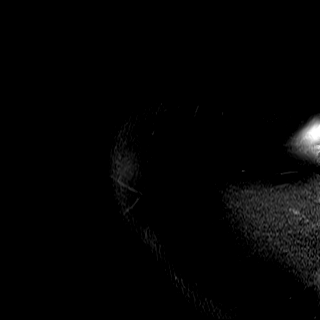

[Series 6: PD · oblique · right · 4.0mm · 0.44mm/px · 8 of 18 slices shown]
[im 1/18]
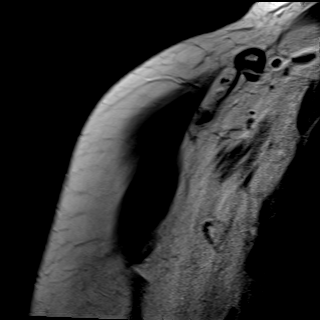
[im 3/18]
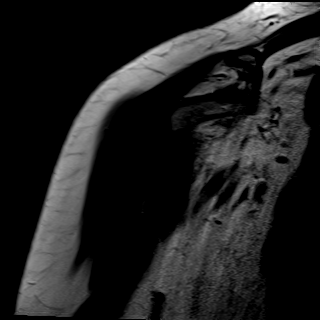
[im 5/18]
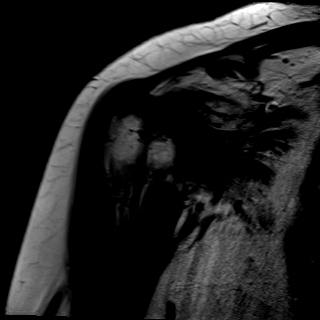
[im 8/18]
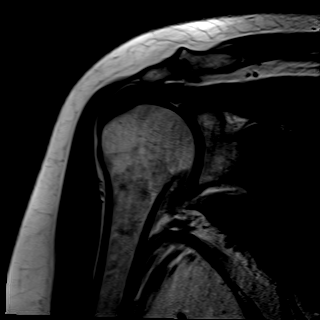
[im 10/18]
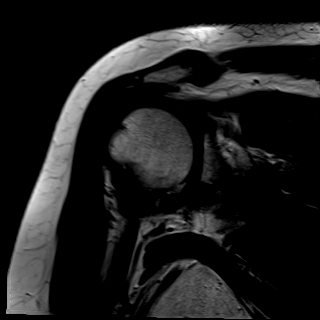
[im 13/18]
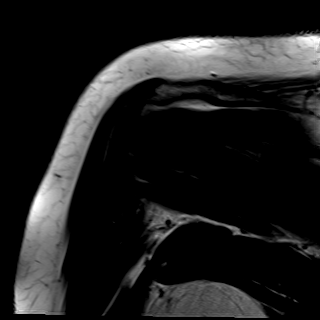
[im 15/18]
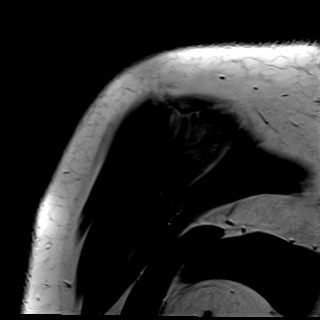
[im 18/18]
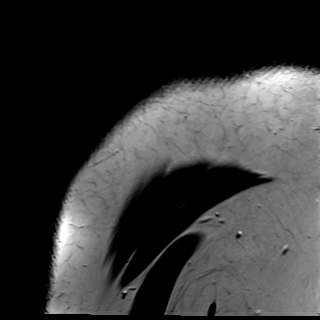

[Series 7: T2 fat-sat · oblique · right · 4.0mm · 0.44mm/px · 8 of 18 slices shown (2 of 3)]
[im 1/18]
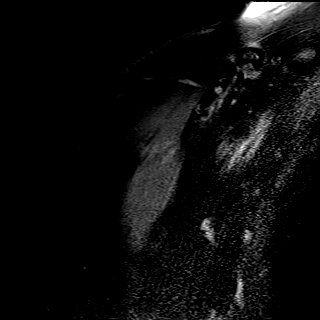
[im 3/18]
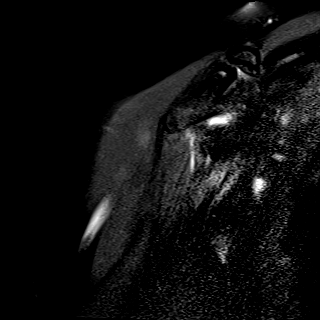
[im 5/18]
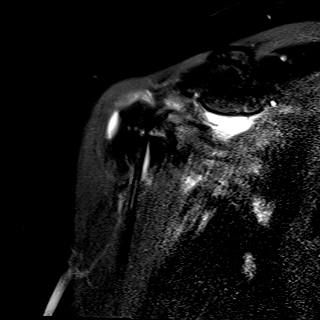
[im 8/18]
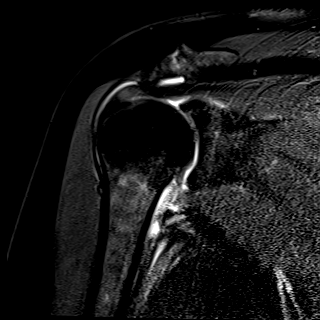
[im 10/18]
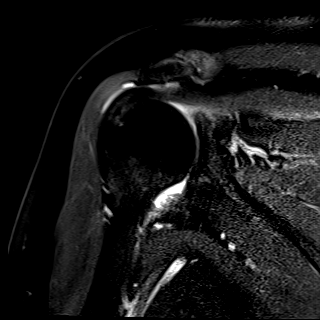
[im 13/18]
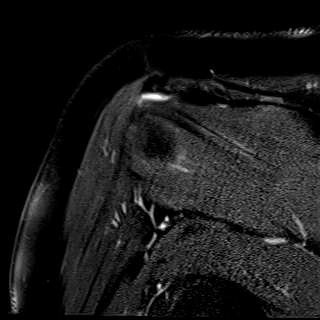
[im 15/18]
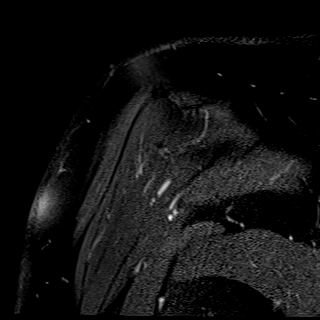
[im 18/18]
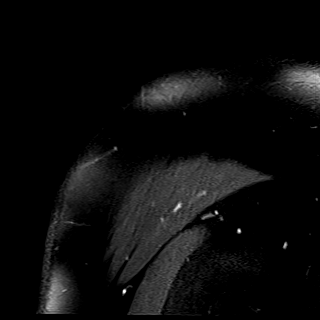

[Series 8: T2 fat-sat · oblique · right · 4.0mm · 0.22mm/px · 6 of 20 slices shown (3 of 3)]
[im 1/20]
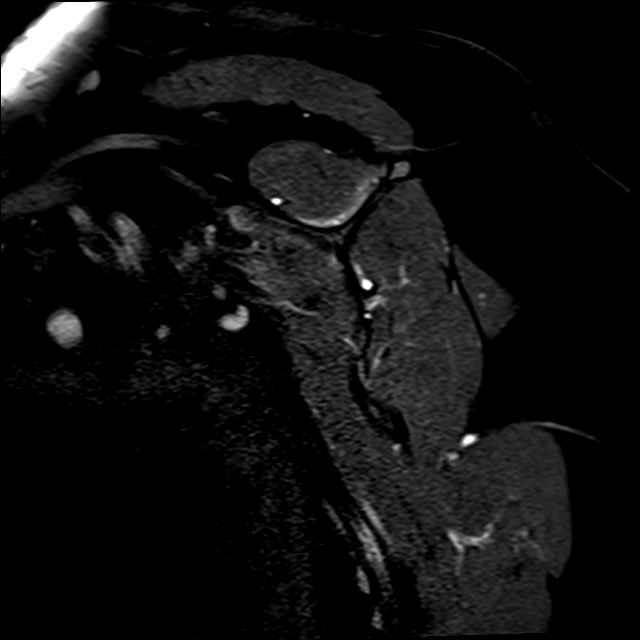
[im 3/20]
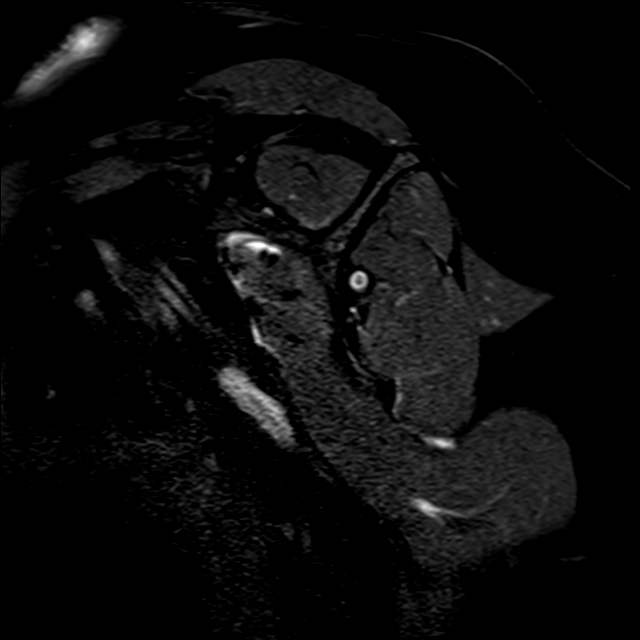
[im 6/20]
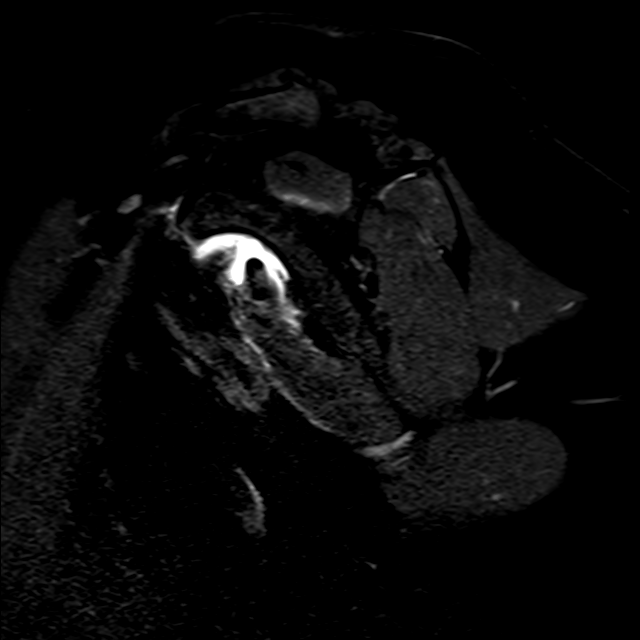
[im 9/20]
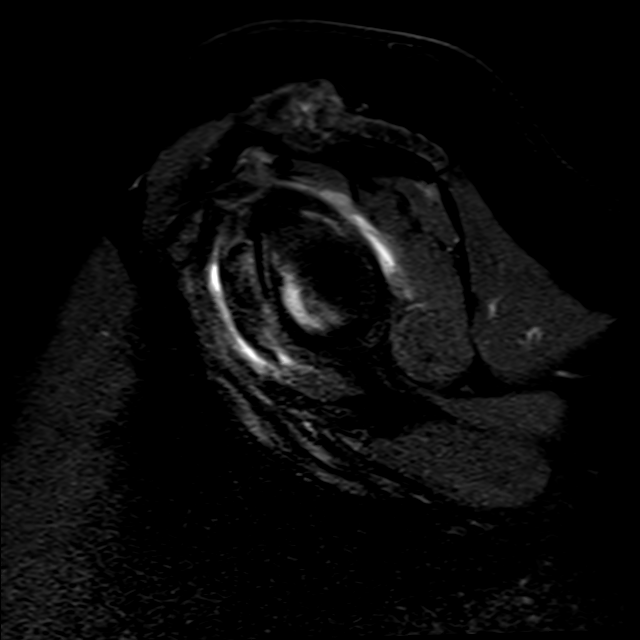
[im 11/20]
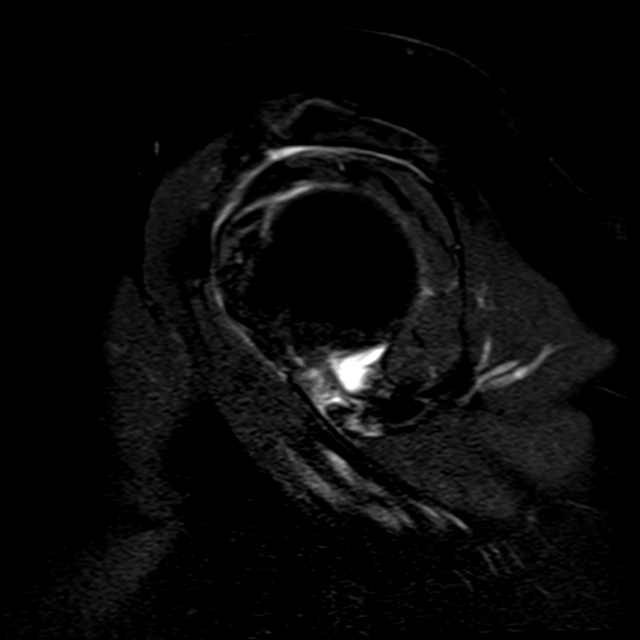
[im 17/20]
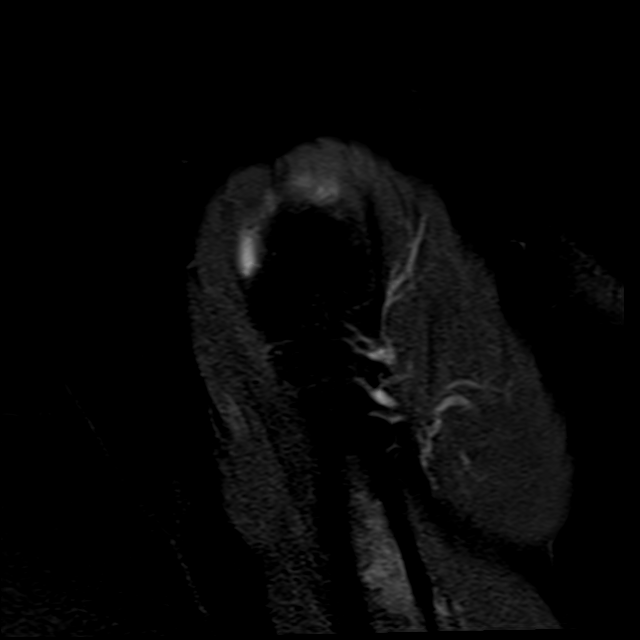

[30 of 40 positions shown; findings below may reference images not displayed]

FINDINGS: Rotator cuff: Moderate tendinosis of the supraspinatus tendon with a
small partial-thickness bursal surface tear anteriorly. Moderate
tendinosis of the infraspinatus tendon. Teres minor tendon is
intact. Mild tendinosis of the subscapularis tendon.

Muscles: No muscle atrophy or edema. No intramuscular fluid
collection or hematoma.

Biceps Long Head: Intraarticular and extraarticular portions of the
biceps tendon are intact.

Acromioclavicular Joint: Moderate arthropathy of the
acromioclavicular joint. Small amount of subacromial/subdeltoid
bursal fluid.

Glenohumeral Joint: Small joint effusion.  Mild chondral thinning.

Labrum: Grossly intact, but evaluation is limited by lack of
intraarticular fluid/contrast.

Bones: No fracture or dislocation. No aggressive osseous lesion.

Other: No fluid collection or hematoma.
IMPRESSION: 1. Moderate tendinosis of the supraspinatus tendon with a small
partial-thickness bursal surface tear anteriorly.
2. Moderate tendinosis of the infraspinatus tendon.
3. Mild tendinosis of the subscapularis tendon.
4. Mild subacromial/subdeltoid bursitis.

## 2022-10-06 DIAGNOSIS — U071 COVID-19: Secondary | ICD-10-CM | POA: Insufficient documentation

## 2022-10-06 NOTE — Assessment & Plan Note (Signed)
Given patient symptoms will treat with prednisone tapering and Flonase nasal spray. Advised to continue plain Mucinex and Tylenol for pain and fever relief. Adequate hydration and rest.  Can use warm water with honey and lime.

## 2022-10-07 DIAGNOSIS — F901 Attention-deficit hyperactivity disorder, predominantly hyperactive type: Secondary | ICD-10-CM | POA: Diagnosis not present

## 2022-10-07 DIAGNOSIS — F331 Major depressive disorder, recurrent, moderate: Secondary | ICD-10-CM | POA: Diagnosis not present

## 2022-10-07 DIAGNOSIS — F411 Generalized anxiety disorder: Secondary | ICD-10-CM | POA: Diagnosis not present

## 2022-10-10 ENCOUNTER — Other Ambulatory Visit: Payer: Self-pay | Admitting: Family Medicine

## 2022-10-10 DIAGNOSIS — Z1231 Encounter for screening mammogram for malignant neoplasm of breast: Secondary | ICD-10-CM

## 2022-10-15 ENCOUNTER — Other Ambulatory Visit: Payer: Self-pay | Admitting: Psychiatry

## 2022-10-15 DIAGNOSIS — G4700 Insomnia, unspecified: Secondary | ICD-10-CM

## 2022-10-15 DIAGNOSIS — F411 Generalized anxiety disorder: Secondary | ICD-10-CM

## 2022-10-20 NOTE — Patient Instructions (Signed)
Below is our plan:  We will continue gabapentin up to three times daily, topiramate 50mg  daily and Nurtec as needed. Consier supplement below for symptoms management.   Please make sure you are staying well hydrated. I recommend 50-60 ounces daily. Well balanced diet and regular exercise encouraged. Consistent sleep schedule with 6-8 hours recommended.   Please continue follow up with care team as directed.   Follow up with me in 1 year   You may receive a survey regarding today's visit. I encourage you to leave honest feed back as I do use this information to improve patient care. Thank you for seeing me today!   GENERAL HEADACHE INFORMATION:   Natural supplements: Magnesium Oxide or Magnesium Glycinate 500 mg at bed (up to 800 mg daily) Coenzyme Q10 300 mg in AM Vitamin B2- 200 mg twice a day   Add 1 supplement at a time since even natural supplements can have undesirable side effects. You can sometimes buy supplements cheaper (especially Coenzyme Q10) at www.WebmailGuide.co.za or at [facility].  Migraine with aura: There is increased risk for stroke in women with migraine with aura and a contraindication for the combined contraceptive pill for use by women who have migraine with aura. The risk for women with migraine without aura is lower. However other risk factors like smoking are far more likely to increase stroke risk than migraine. There is a recommendation for no smoking and for the use of OCPs without estrogen such as progestogen only pills particularly for women with migraine with aura.Marland Kitchen People who have migraine headaches with auras may be 3 times more likely to have a stroke caused by a blood clot, compared to migraine patients who don't see auras. Women who take hormone-replacement therapy may be 30 percent more likely to suffer a clot-based stroke than women not taking medication containing estrogen. Other risk factors like smoking and high blood pressure may be  much more important.     Vitamins and herbs that show potential:   Magnesium: Magnesium (250 mg twice a day or 500 mg at bed) has a relaxant effect on smooth muscles such as blood vessels. Individuals suffering from frequent or daily headache usually have low magnesium levels which can be increase with daily supplementation of 400-750 mg. Three trials found 40-90% average headache reduction  when used as a preventative. Magnesium may help with headaches are aura, the best evidence for magnesium is for migraine with aura is its thought to stop the cortical spreading depression we believe is the pathophysiology of migraine aura.Magnesium also demonstrated the benefit in menstrually related migraine.  Magnesium is part of the messenger system in the serotonin cascade and it is a good muscle relaxant.  It is also useful for constipation which can be a side effect of other medications used to treat migraine. Good sources include nuts, whole grains, and tomatoes. Side Effects: loose stool/diarrhea  Riboflavin (vitamin B 2) 200 mg twice a day. This vitamin assists nerve cells in the production of ATP a principal energy storing molecule.  It is necessary for many chemical reactions in the body.  There have been at least 3 clinical trials of riboflavin using 400 mg per day all of which suggested that migraine frequency can be decreased.  All 3 trials showed significant improvement in over half of migraine sufferers.  The supplement is found in bread, cereal, milk, meat, and poultry.  Most Americans get more riboflavin than the recommended daily allowance, however riboflavin deficiency is not necessary for the  supplements to help prevent headache. Side effects: energizing, green urine   Coenzyme Q10: This is present in almost all cells in the body and is critical component for the conversion of energy.  Recent studies have shown that a nutritional supplement of CoQ10 can reduce the frequency of migraine attacks by improving the energy  production of cells as with riboflavin.  Doses of 150 mg twice a day have been shown to be effective.   Melatonin: Increasing evidence shows correlation between melatonin secretion and headache conditions.  Melatonin supplementation has decreased headache intensity and duration.  It is widely used as a sleep aid.  Sleep is natures way of dealing with migraine.  A dose of 3 mg is recommended to start for headaches including cluster headache. Higher doses up to 15 mg has been reviewed for use in Cluster headache and have been used. The rationale behind using melatonin for cluster is that many theories regarding the cause of Cluster headache center around the disruption of the normal circadian rhythm in the brain.  This helps restore the normal circadian rhythm.   HEADACHE DIET: Foods and beverages which may trigger migraine Note that only 20% of headache patients are food sensitive. You will know if you are food sensitive if you get a headache consistently 20 minutes to 2 hours after eating a certain food. Only cut out a food if it causes headaches, otherwise you might remove foods you enjoy! What matters most for diet is to eat a well balanced healthy diet full of vegetables and low fat protein, and to not miss meals.   Chocolate, other sweets ALL cheeses except cottage and cream cheese Dairy products, yogurt, sour cream, ice cream Liver Meat extracts (Bovril, Marmite, meat tenderizers) Meats or fish which have undergone aging, fermenting, pickling or smoking. These include: Hotdogs,salami,Lox,sausage, mortadellas,smoked salmon, pepperoni, Pickled herring Pods of broad bean (English beans, Chinese pea pods, [place] (fava) beans, lima and navy beans Ripe avocado, ripe banana Yeast extracts or active yeast preparations such as Brewer's or Fleishman's (commercial bakes goods are permitted) Tomato based foods, pizza (lasagna, etc.)   MSG (monosodium glutamate) is disguised as many things; look for  these common aliases: Monopotassium glutamate Autolysed yeast Hydrolysed protein Sodium caseinate "flavorings" "all natural preservatives" Nutrasweet   Avoid all other foods that convincingly provoke headaches.   Resources: The Dizzy [place] Laundry Your Headache Diet, migrainestrong.com  https://zamora-andrews.com/   Caffeine and Migraine For patients that have migraine, caffeine intake more than 3 days per week can lead to dependency and increased migraine frequency. I would recommend cutting back on your caffeine intake as best you can. The recommended amount of caffeine is 200-300 mg daily, although migraine patients may experience dependency at even lower doses. While you may notice an increase in headache temporarily, cutting back will be helpful for headaches in the long run. For more information on caffeine and migraine, visit: https://americanmigrainefoundation.org/resource-library/caffeine-and-migraine/   Headache Prevention Strategies:   1. Maintain a headache diary; learn to identify and avoid triggers.  - This can be a simple note where you log when you had a headache, associated symptoms, and medications used - There are several smartphone apps developed to help track migraines: Migraine Buddy, Migraine Monitor, Curelator N1-Headache App   Common triggers include: Emotional triggers: Emotional/Upset family or friends Emotional/Upset occupation Business reversal/success Anticipation anxiety Crisis-serious Post-crisis periodNew job/position   Physical triggers: Vacation Day Weekend Strenuous Exercise High Altitude Location New Move Menstrual Day Physical Illness Oversleep/Not enough sleep Weather changes Light:  Photophobia or light sesnitivity treatment involves a balance between desensitization and reduction in overly strong input. Use dark polarized glasses outside, but not inside. Avoid bright or fluorescent light, but  do not dim environment to the point that going into a normally lit room hurts. Consider FL-41 tint lenses, which reduce the most irritating wavelengths without blocking too much light.  These can be obtained at axonoptics.com or theraspecs.com Foods: see list above.   2. Limit use of acute treatments (over-the-counter medications, triptans, etc.) to no more than 2 days per week or 10 days per month to prevent medication overuse headache (rebound headache).     3. Follow a regular schedule (including weekends and holidays): Don't skip meals. Eat a balanced diet. 8 hours of sleep nightly. Minimize stress. Exercise 30 minutes per day. Being overweight is associated with a 5 times increased risk of chronic migraine. Keep well hydrated and drink 6-8 glasses of water per day.   4. Initiate non-pharmacologic measures at the earliest onset of your headache. Rest and quiet environment. Relax and reduce stress. Breathe2Relax is a free app that can instruct you on    some simple relaxtion and breathing techniques. Http://Dawnbuse.com is a    free website that provides teaching videos on relaxation.  Also, there are  many apps that   can be downloaded for "mindful" relaxation.  An app called YOGA NIDRA will help walk you through mindfulness. Another app called Calm can be downloaded to give you a structured mindfulness guide with daily reminders and skill development. Headspace for guided meditation Mindfulness Based Stress Reduction Online Course: www.palousemindfulness.com Cold compresses.   5. Don't wait!! Take the maximum allowable dosage of prescribed medication at the first sign of migraine.   6. Compliance:  Take prescribed medication regularly as directed and at the first sign of a migraine.   7. Communicate:  Call your physician when problems arise, especially if your headaches change, increase in frequency/severity, or become associated with neurological symptoms (weakness, numbness, slurred  speech, etc.). Proceed to emergency room if you experience new or worsening symptoms or symptoms do not resolve, if you have new neurologic symptoms or if headache is severe, or for any concerning symptom.   8. Headache/pain management therapies: Consider various complementary methods, including medication, behavioral therapy, psychological counselling, biofeedback, massage therapy, acupuncture, dry needling, and other modalities.  Such measures may reduce the need for medications. Counseling for pain management, where patients learn to function and ignore/minimize their pain, seems to work very well.   9. Recommend changing family's attention and focus away from patient's headaches. Instead, emphasize daily activities. If first question of day is 'How are your headaches/Do you have a headache today?', then patient will constantly think about headaches, thus making them worse. Goal is to re-direct attention away from headaches, toward daily activities and other distractions.   10. Helpful Websites: www.AmericanHeadacheSociety.org PatentHood.ch www.headaches.org TightMarket.nl www.achenet.org

## 2022-10-20 NOTE — Progress Notes (Unsigned)
PATIENT: Kristin [name] DOB: 1960/02/16  REASON FOR VISIT: follow up HISTORY FROM: patient  No chief complaint on file.    HISTORY OF PRESENT ILLNESS:  10/20/22 ALL: Kristin [name] returns for follow up for migraines and diabetic neuropathy. She was last seen 02/2022. We continued gabapentin and Nurtec and restarted topiramate 50mg  daily. Since,   03/05/2022 ALL: Kristin [name] returns for follow up for migraines and diabetic neuropathy. She was last seen 01/2021 and doing failry well. She reports discontinuing topiramate at some point over the past year. PCP also stopped amitriptyline due to dizziness over the summer of 2023.She has continued Nurtec PRN. She reports daily tension style headaches have worsened. She has about 2 migraines per month.   Gabapentin 300mg , usually just twice daily. She feels pain was significantly improved for a while but has gotten a little worse over the past two months. She reports some tingling but no pins and needles. Muscle cramps are much better.  She feels numbness is worse. She is having trouble with balance. She feels that she is falling forward at times. She has had two falls. Fortunately, no significant injuries. She is not using an assistive device.   She has had two surgeries on the right shoulder over the past year. She just finished PT.   02/07/2021 ALL: Kristin [name] returns for follow up for migraines and diabetic polyneuropathy. She continues topiramate 50mg  BID and Nurtec as needed. Also on amitriptyline 25mg  at bedtime for sleep managed by psychiatry.   She feels that she is doing ok. Headaches are usually well managed. She did have a migraine that lasted about 2 days around Christmas and she contributes to stress. She thinks it was about 2-3 months since she had to use abortive meds.   Dr Lucia Gaskins started gabapentin 300mg  TID and advised she consider alpha lipoic acid as well as lidocaine and capsaicin OTC. She has only taken gabapentin at night due to  sleepiness. She did not try OTC meds. She does continue to have numbness/tingling of both hands and feet. She feels that right hand may be a little weaker than left. Has trouble holding glass or coffee cups. Does well with plastic cups. She also reports new numbness of posterior right thigh. Worse when sitting. She has noticed for a couple of weeks. No lower extremity weakness. She does have intermittent low back pain.   She has lost 20 pounds since 07/2020, 50lbs over the past year. A1C now 6.2. She is taking Mounjaro and tolerating well.    08/07/2020 AA: She has very poor balance, she feels like she may keep going forward when stands, when she walks down the grocery isle she feels like she has to hold onto her husband. Pins and needles, cramping in the feet, she can be sitting, walking, in bed it is more hte cramps int he back of the legs and the arch of the foot, K+ pills helped with the cramps. Slowly progressive. Ongoing for over a year.    HgbA1c 6/22 7.4  04/05/20 8.3, 03/2020 B12 383, 03/2020 tsh 2.49   Interval history 11/10/2017: MR of the brain showed frontal chronic sinusitis, discussed seeing ENT. Propranolol is helping, the headaches are not as bad. She has been tested in the past recently 3 years ago and no sleep apnea.  She is under more stress now. Her pulse is 60 cannot increase propranolol further. Discussed options, due to stress may start Prozac which is a migraine preventative and can help with stress. Zofran  for dizziness or nausea. Also start Emgality.    Until this past week has been doing "great" with the migraines but this one has on ongoing going for days will give you a torado Toradol l injection at that . tried: propranolol, topiramate, prozac, nortriptyline, verapamil   REVIEW OF SYSTEMS: Out of a complete 14 system review of symptoms, the patient complains only of the following symptoms, headaches, anxiety, low back pain, numbness/tingling, insomnia and all  other reviewed systems are negative.   ALLERGIES: Allergies  Allergen Reactions   Tetracyclines & Related Swelling and Rash    FACE SWELL, RASH ON CHEST   Amoxicillin    Alcohol Rash    ABDOMINAL CRAMPS, drinking alcohol   Azithromycin Rash   Metoprolol Diarrhea    HOME MEDICATIONS: Outpatient Medications Prior to Visit  Medication Sig Dispense Refill   acetaminophen (TYLENOL) 500 MG tablet Take 2 tablets (1,000 mg total) by mouth every 8 (eight) hours. 90 tablet 2   Calcium Citrate-Vitamin D 500-10 MG-MCG CHEW Take 1 tablet two times a day     citalopram (CELEXA) 40 MG tablet TAKE 1 TABLET BY MOUTH EVERY DAY 90 tablet 0   clonazePAM (KLONOPIN) 0.5 MG tablet Take 0.5-1 tablets (0.25-0.5 mg total) by mouth daily as needed for anxiety. Limited supply 10 tablet 0   Cyanocobalamin (VITAMIN B-12) 1000 MCG SUBL Place 1 tablet (1,000 mcg total) under the tongue daily. 90 tablet 3   fexofenadine (ALLEGRA) 180 MG tablet Take 1 tablet (180 mg total) by mouth daily as needed for allergies or rhinitis. 90 tablet 3   fluticasone (FLONASE) 50 MCG/ACT nasal spray Place 2 sprays into both nostrils daily. 16 g 6   gabapentin (NEURONTIN) 300 MG capsule TAKE 1 CAPSULE BY MOUTH 3 TIMES DAILY AS NEEDED. 90 capsule 5   hydrOXYzine (ATARAX) 25 MG tablet TAKE 0.5-1 TABLETS (12.5-25 MG TOTAL) BY MOUTH 3 (THREE) TIMES DAILY AS NEEDED FOR ANXIETY (AND SLEEP). 90 tablet 1   montelukast (SINGULAIR) 10 MG tablet Take 1 tablet (10 mg total) by mouth at bedtime. 90 tablet 3   omeprazole (PRILOSEC) 40 MG capsule Take 1 capsule (40 mg total) by mouth daily. 90 capsule 3   ondansetron (ZOFRAN-ODT) 4 MG disintegrating tablet Take 1 tablet (4 mg total) by mouth every 8 (eight) hours as needed. for nausea 20 tablet 2   predniSONE (DELTASONE) 10 MG tablet Take 4 tablets ( total 40 mg) by mouth for 2 days; take 3 tablets ( total 30 mg) by mouth for 2 days; take 2 tablets ( total 20 mg) by mouth for 1 day; take 1 tablet (  total 10 mg) by mouth for 1 day. 17 tablet 0   Rimegepant Sulfate (NURTEC) 75 MG TBDP Take 1 tablet (75 mg total) by mouth daily as needed (take for abortive therapy of migraine, no more than 1 tablet in 24 hours or 10 per month). 10 tablet 2   rosuvastatin (CRESTOR) 20 MG tablet Take 1 tablet (20 mg total) by mouth daily. 90 tablet 2   tirzepatide (MOUNJARO) 2.5 MG/0.5ML Pen INJECT 2.5 MG SUBCUTANEOUSLY WEEKLY 6 mL 1   topiramate (TOPAMAX) 50 MG tablet Take 1 tablet (50 mg total) by mouth daily. 90 tablet 3   zolpidem (AMBIEN CR) 6.25 MG CR tablet Take 2 tablets (12.5 mg total) by mouth at bedtime as needed for sleep. 120 tablet 3   No facility-administered medications prior to visit.    PAST MEDICAL HISTORY: Past Medical History:  Diagnosis Date   ADHD    Adhesive capsulitis of right shoulder 07/23/2020   Allergy    Annual physical exam 08/19/2019   Anxiety 12/12/2015   Arthritis    Basal cell carcinoma    nose, left cheek, right upper lip, right posterior shoulder most recent 6 years ago on left cheek   Basal cell carcinoma 03/26/2021   L chest, EDC 04/24/2021   Benign essential hypertension 08/14/2014   Bleeding    Bursitis of right shoulder 12/25/2020   Chicken pox    Chronic diarrhea    Chronic intractable headache 10/20/2017   Chronic migraine w/o aura, not intractable, w/o stat migr 08/08/2020   Complication of anesthesia    blood pressure elevated after gastric sleeve surgery in recovery, pt does not want mask over face; migraines   COVID-19    03/01/20 had MAB 03/03/20 and felt better next day 03/04/20   CTS (carpal tunnel syndrome) 04/05/2020   Dr. Sherryll Burger s/p [facility]/Lidocaine injection 12/05/19   Depression    Diabetes mellitus without complication (HCC)    pre-diabetic currently, was diabetic prior to gastric sleeve surgery    Diabetic polyneuropathy associated with diabetes mellitus due to underlying condition (HCC) 08/08/2020   Disorder of right rotator cuff    10/03/19  MRI right shoulder abnormal Dr. Zettie Cooley   Fall    08/2020   Fall    03/28/21   Fatty liver    Fatty liver 05/06/2017   [place] + 2013    Gout    Hematuria 10/15/2018   History of shingles 06/19/2021   lower back and R side of thigh   Hives 03/28/2020   Hyperlipidemia    Hypertension    no problems since losing weight after gastric sleeve surgery   Hypertension 07/17/2014   IBS (irritable bowel syndrome)    Injury of right wrist 06/26/2016   Insomnia    Migraine    Neuropathy    bilateral feet   Obesity    Obesity (BMI 30-39.9) 05/06/2017   Obesity, diabetes, and hypertension syndrome (HCC) 08/18/2019   Osteopenia    Palpitations 08/14/2014   PONV (postoperative nausea and vomiting)    Squamous cell carcinoma of skin 03/26/2021   SCC IS R mid sternum, EDC 04/24/2021   Tendonitis of shoulder, right 12/25/2020    PAST SURGICAL HISTORY: Past Surgical History:  Procedure Laterality Date   ABDOMINAL HYSTERECTOMY     no h/o abnormal pap still has 1 ovary left   ANKLE ARTHROSCOPY Left 05/29/2016   Procedure: ANKLE ARTHROSCOPY;  Surgeon: Felecia Shelling, DPM;  Location: [facility] OR;  Service: Podiatry;  Laterality: Left;   APPENDECTOMY     basal cell removed     CESAREAN SECTION N/A    1985 & 1995   CHOLECYSTECTOMY     CLOSED MANIPULATION SHOULDER WITH STERIOD INJECTION Right 03/31/2022   Procedure: MANIPULATION UNDER ANESTHESIA WITH GLENOHUMERAL AND SUBACROMIAL CORTICOSTERIOD INJECTION;  Surgeon: Signa Kell, MD;  Location: [facility] ORS;  Service: Orthopedics;  Laterality: Right;   COLONOSCOPY WITH PROPOFOL N/A 01/31/2019   Procedure: COLONOSCOPY WITH PROPOFOL;  Surgeon: Toney Reil, MD;  Location: [facility] ENDOSCOPY;  Service: Gastroenterology;  Laterality: N/A;   cyst removed from wrist     ESOPHAGOGASTRODUODENOSCOPY (EGD) WITH PROPOFOL N/A 01/31/2019   Procedure: ESOPHAGOGASTRODUODENOSCOPY (EGD) WITH PROPOFOL;  Surgeon: Toney Reil, MD;  Location: [facility] ENDOSCOPY;  Service:  Gastroenterology;  Laterality: N/A;   ESOPHAGOGASTRODUODENOSCOPY (EGD) WITH PROPOFOL N/A 05/20/2022   Procedure: ESOPHAGOGASTRODUODENOSCOPY (EGD)  WITH PROPOFOL;  Surgeon: Toney Reil, MD;  Location: [facility] ENDOSCOPY;  Service: Gastroenterology;  Laterality: N/A;   ESOPHAGOGASTRODUODENOSCOPY (EGD) WITH PROPOFOL N/A 09/24/2022   Procedure: ESOPHAGOGASTRODUODENOSCOPY (EGD) WITH PROPOFOL;  Surgeon: Toney Reil, MD;  Location: [facility] ENDOSCOPY;  Service: Gastroenterology;  Laterality: N/A;   FLEXIBLE SIGMOIDOSCOPY N/A 05/21/2020   Procedure: FLEXIBLE SIGMOIDOSCOPY;  Surgeon: Toney Reil, MD;  Location: [facility] ENDOSCOPY;  Service: Gastroenterology;  Laterality: N/A;   FOOT SURGERY     left foot 11/2016 Dr. Vinnie Level    left elbow surgery     07/2017 Dr Erin Sons epicondylitis    LYSIS OF ADHESION Right 08/27/2021   Procedure: Right shoulder arthroscopic lysis of adhesions, capsular release, and manipulation under anesthesia with corticosteroid injection and biceps tenodesis;  Surgeon: Signa Kell, MD;  Location: [facility];  Service: Orthopedics;  Laterality: Right;   MOHS SURGERY     x 2 face nose and left cheek    OSTEOCHONDROMA EXCISION Left 05/29/2016   Procedure: OSTEOCHONDRAL DRILLING TALUS;  Surgeon: Felecia Shelling, DPM;  Location: [facility];  Service: Podiatry;  Laterality: Left;   PLANTAR FASCIA RELEASE Left 05/29/2016   Procedure: ENDOSCOPIC PLANTAR FASCIOTOMY;  Surgeon: Felecia Shelling, DPM;  Location: [facility] OR;  Service: Podiatry;  Laterality: Left;   ROTATOR CUFF REPAIR Left    SHOULDER ARTHROSCOPY WITH BICEPSTENOTOMY Right 03/31/2022   Procedure: SHOULDER ARTHROSCOPY WITH BICEPS TENOTOMY, ARTHROSCOPIC LYSIS OF ADHESIONS, AND CAPSULAR RELEASE, AND EXTENSIVE GLENOHUMERAL DEBRIDEMENT;  Surgeon: Signa Kell, MD;  Location: [facility] ORS;  Service: Orthopedics;  Laterality: Right;   SHOULDER ARTHROSCOPY WITH ROTATOR CUFF REPAIR  03/31/2022   Procedure: SHOULDER ARTHROSCOPY  WITH ROTATOR CUFF REPAIR;  Surgeon: Signa Kell, MD;  Location: [facility] OR[facility];  Service: Orthopedics;;   STOMACH SURGERY  2017   gastric sleeve surgery   TENNIS ELBOW RELEASE/NIRSCHEL PROCEDURE Left 08/05/2017   Procedure: TENNIS ELBOW RELEASE/NIRSCHEL PROCEDURE;  Surgeon: Erin Sons, MD;  Location: [facility] ORS;  Service: Orthopedics;  Laterality: Left;   TUBAL LIGATION     wedge resection of ovary  1982    FAMILY HISTORY: Family History  Problem Relation Age of Onset   Heart disease Mother    Breast cancer Mother 61       metastatic, now hip, spine, and leg    Arthritis Mother    Hyperlipidemia Mother    Cancer Mother        spinal surgery. breast dx'ed age 19 now metastatic 23 as of 07/13/18   Cystic fibrosis Mother        carrier    Heart disease Father    Lung cancer Father        smoker   Hyperlipidemia Father    Hypertension Father    Depression Sister    Diabetes Sister    Stroke Sister    Other Sister        covid +   Diabetes Sister    Diabetes Maternal Aunt    Alcohol abuse Maternal Aunt    Diabetes Maternal Aunt    Alcohol abuse Maternal Uncle    Diabetes Maternal Uncle    Alcohol abuse Paternal Uncle    Diabetes Maternal Grandfather    Hyperlipidemia Maternal Grandfather    Arthritis Maternal Grandmother    Sudden death Cousin    Schizophrenia Cousin    Cystic fibrosis Other        great niece    Factor V Leiden deficiency Other  uncle and cousins and m GF (all maternal)   Alcohol abuse Niece    Alcohol abuse Nephew    ADD / ADHD Son    Depression Son    ADD / ADHD Son    Post-traumatic stress disorder Son    Colon cancer Neg Hx    Ovarian cancer Neg Hx     SOCIAL HISTORY: Social History   Socioeconomic History   Marital status: Married    Spouse name: Not on file   Number of children: 3   Years of education: Not on file   Highest education level: Some college, no degree  Occupational History   Not on file  Tobacco Use   Smoking  status: Never   Smokeless tobacco: Never  Vaping Use   Vaping status: Never Used  Substance and Sexual Activity   Alcohol use: Never   Drug use: Never   Sexual activity: Yes    Partners: Male    Birth control/protection: Surgical  Other Topics Concern   Not on file  Social History Narrative   Married x 37 years as of 10/2017    Kids 3 boys youngest is Renae Fickle   Write childrens books    Retired from [employer] 01/2017    Enjoys traveling    Right handed   Caffeine: quit 2009   Social Determinants of Health   Financial Resource Strain: Low Risk  (07/01/2022)   Overall Financial Resource Strain (CARDIA)    Difficulty of Paying Living Expenses: Not very hard  Food Insecurity: No Food Insecurity (08/22/2022)   Hunger Vital Sign    Worried About Running Out of Food in the Last Year: Never true    Ran Out of Food in the Last Year: Never true  Transportation Needs: No Transportation Needs (08/22/2022)   PRAPARE - Administrator, Civil Service (Medical): No    Lack of Transportation (Non-Medical): No  Physical Activity: Insufficiently Active (07/01/2022)   Exercise Vital Sign    Days of Exercise per Week: 2 days    Minutes of Exercise per Session: 20 min  Stress: Stress Concern Present (07/01/2022)   [employer] of Occupational Health - Occupational Stress Questionnaire    Feeling of Stress : Very much  Social Connections: Moderately Integrated (07/01/2022)   Social Connection and Isolation Panel [NHANES]    Frequency of Communication with Friends and Family: Three times a week    Frequency of Social Gatherings with Friends and Family: Twice a week    Attends Religious Services: More than 4 times per year    Active Member of [employer] or Organizations: No    Attends Engineer, structural: Not on file    Marital Status: Married  Catering manager Violence: Not on file      PHYSICAL EXAM  There were no vitals filed for this visit.    There is no height or weight  on file to calculate BMI.  Generalized: Well developed, in no acute distress  Cardiology: normal rate and rhythm, no murmur noted Respiratory: Clear to auscultation bilaterally  Neurological examination  Mentation: Alert oriented to time, place, history taking. Follows all commands speech and language fluent Cranial nerve II-XII: Pupils were equal round reactive to light. Extraocular movements were full, visual field were full on confrontational test. Facial sensation and strength were normal. Head turning and shoulder shrug  were normal and symmetric. Motor: The motor testing reveals 5 over 5 strength of all 4 extremities. Grip strength is normal. Good  symmetric motor tone is noted throughout.  Sensory: Sensory testing is intact to soft touch, pinprick and vibration on all 4 extremities with exception of decreased vibratory sensation of left lateral malleolus (prior ankle surgery) No evidence of extinction is noted.  Coordination: Cerebellar testing reveals good finger-nose-finger and heel-to-shin bilaterally.  Gait and station: Gait is normal.   DIAGNOSTIC DATA (LABS, IMAGING, TESTING) - I reviewed patient records, labs, notes, testing and imaging myself where available.      No data to display           Lab Results  Component Value Date   WBC 6.9 08/18/2022   HGB 13.0 08/18/2022   HCT 38.8 08/18/2022   MCV 90.9 08/18/2022   PLT 225 08/18/2022      Component Value Date/Time   NA 137 08/18/2022 1728   NA 141 05/05/2019 0838   NA 139 10/24/2012 1218   K 4.0 08/18/2022 1728   K 3.9 10/24/2012 1218   CL 106 08/18/2022 1728   CL 103 10/24/2012 1218   CO2 23 08/18/2022 1728   CO2 29 10/24/2012 1218   GLUCOSE 82 08/18/2022 1728   GLUCOSE 103 (H) 10/24/2012 1218   BUN 17 08/18/2022 1728   BUN 11 05/05/2019 0838   BUN 12 10/24/2012 1218   CREATININE 0.81 08/18/2022 1728   CREATININE 0.67 10/24/2012 1218   CALCIUM 8.6 (L) 08/18/2022 1728   CALCIUM 9.0 10/24/2012 1218   PROT  6.7 07/03/2022 1049   PROT 7.4 10/15/2020 1356   PROT 7.4 10/24/2012 1218   ALBUMIN 4.1 07/03/2022 1049   ALBUMIN 4.7 10/15/2020 1356   ALBUMIN 3.2 (L) 10/24/2012 1218   AST 16 07/03/2022 1049   AST 14 (L) 10/24/2012 1218   ALT 18 07/03/2022 1049   ALT 22 10/24/2012 1218   ALKPHOS 66 07/03/2022 1049   ALKPHOS 99 10/24/2012 1218   BILITOT 0.5 07/03/2022 1049   BILITOT 0.3 10/15/2020 1356   BILITOT 0.2 10/24/2012 1218   GFRNONAA >60 08/18/2022 1728   GFRNONAA >60 10/24/2012 1218   GFRAA 110 05/05/2019 0838   GFRAA >60 10/24/2012 1218   Lab Results  Component Value Date   CHOL 151 07/03/2022   HDL 59.20 07/03/2022   LDLCALC 77 07/03/2022   LDLDIRECT 48.0 11/08/2020   TRIG 70.0 07/03/2022   CHOLHDL 3 07/03/2022   Lab Results  Component Value Date   HGBA1C 5.2 07/03/2022   Lab Results  Component Value Date   VITAMINB12 419 07/03/2022   Lab Results  Component Value Date   TSH 2.524 08/18/2022       ASSESSMENT AND PLAN 62 y.o. year old female  has a past medical history of ADHD, Adhesive capsulitis of right shoulder (07/23/2020), Allergy, Annual physical exam (08/19/2019), Anxiety (12/12/2015), Arthritis, Basal cell carcinoma, Basal cell carcinoma (03/26/2021), Benign essential hypertension (08/14/2014), Bleeding, Bursitis of right shoulder (12/25/2020), Chicken pox, Chronic diarrhea, Chronic intractable headache (10/20/2017), Chronic migraine w/o aura, not intractable, w/o stat migr (08/08/2020), Complication of anesthesia, COVID-19, CTS (carpal tunnel syndrome) (04/05/2020), Depression, Diabetes mellitus without complication (HCC), Diabetic polyneuropathy associated with diabetes mellitus due to underlying condition (HCC) (08/08/2020), Disorder of right rotator cuff, Fall, Fall, Fatty liver, Fatty liver (05/06/2017), Gout, Hematuria (10/15/2018), History of shingles (06/19/2021), Hives (03/28/2020), Hyperlipidemia, Hypertension, Hypertension (07/17/2014), IBS (irritable bowel  syndrome), Injury of right wrist (06/26/2016), Insomnia, Migraine, Neuropathy, Obesity, Obesity (BMI 30-39.9) (05/06/2017), Obesity, diabetes, and hypertension syndrome (HCC) (08/18/2019), Osteopenia, Palpitations (08/14/2014), PONV (postoperative nausea and vomiting), Squamous cell carcinoma of  skin (03/26/2021), and Tendonitis of shoulder, right (12/25/2020). here with   No diagnosis found.    Kristin [name] reports that headaches have worsened. We will restart topiramate 50mg  daily for prevention and continue Nurtec as needed for abortive therapy. She will continue gabapentin 300mg  up to three times daily for DM neuropathy. Alpha lipoic acid, lidocaine and capsaicin could also provide relief if needed. Consider PT for gait/balance training. She will work on adequate hydration, well balanced diet and regular exercise. She will return for follow up in 6 months. She verbalizes understanding and agreement with this plan.    No orders of the defined types were placed in this encounter.    No orders of the defined types were placed in this encounter.     Christena Deem 10/20/2022, 4:39 PM [facility] [address] [place], [place] [address] [phone]

## 2022-10-22 ENCOUNTER — Encounter: Payer: Self-pay | Admitting: Family Medicine

## 2022-10-22 ENCOUNTER — Ambulatory Visit (INDEPENDENT_AMBULATORY_CARE_PROVIDER_SITE_OTHER): Payer: BC Managed Care – PPO | Admitting: Family Medicine

## 2022-10-22 VITALS — BP 114/60 | HR 72 | Ht 62.0 in | Wt 112.0 lb

## 2022-10-22 DIAGNOSIS — G43709 Chronic migraine without aura, not intractable, without status migrainosus: Secondary | ICD-10-CM

## 2022-10-22 DIAGNOSIS — E0842 Diabetes mellitus due to underlying condition with diabetic polyneuropathy: Secondary | ICD-10-CM | POA: Diagnosis not present

## 2022-10-22 MED ORDER — GABAPENTIN 300 MG PO CAPS: ORAL_CAPSULE | ORAL | 5 refills | Status: DC

## 2022-10-22 MED ORDER — TOPIRAMATE 50 MG PO TABS: 50.0000 mg | ORAL_TABLET | Freq: Every day | ORAL | 3 refills | Status: DC

## 2022-10-22 MED ORDER — NURTEC 75 MG PO TBDP: 75.0000 mg | ORAL_TABLET | Freq: Every day | ORAL | 2 refills | Status: AC | PRN

## 2022-10-27 ENCOUNTER — Ambulatory Visit
Admission: RE | Admit: 2022-10-27 | Discharge: 2022-10-27 | Disposition: A | Discharge status: Home / Self Care | Payer: BC Managed Care – PPO | Source: Ambulatory Visit | Attending: Family Medicine | Admitting: Family Medicine

## 2022-10-27 DIAGNOSIS — Z1231 Encounter for screening mammogram for malignant neoplasm of breast: Secondary | ICD-10-CM | POA: Diagnosis not present

## 2022-10-28 ENCOUNTER — Telehealth (INDEPENDENT_AMBULATORY_CARE_PROVIDER_SITE_OTHER): Payer: BC Managed Care – PPO | Admitting: Psychiatry

## 2022-10-28 ENCOUNTER — Encounter: Payer: Self-pay | Admitting: Psychiatry

## 2022-10-28 DIAGNOSIS — G4700 Insomnia, unspecified: Secondary | ICD-10-CM | POA: Diagnosis not present

## 2022-10-28 DIAGNOSIS — F411 Generalized anxiety disorder: Secondary | ICD-10-CM | POA: Diagnosis not present

## 2022-10-28 DIAGNOSIS — F330 Major depressive disorder, recurrent, mild: Secondary | ICD-10-CM

## 2022-10-28 DIAGNOSIS — Z634 Disappearance and death of family member: Secondary | ICD-10-CM | POA: Diagnosis not present

## 2022-10-28 MED ORDER — CITALOPRAM HYDROBROMIDE 40 MG PO TABS
40.0000 mg | ORAL_TABLET | Freq: Every day | ORAL | 1 refills | Status: DC
Start: 2022-10-28 — End: 2023-04-30

## 2022-10-28 NOTE — Progress Notes (Unsigned)
Virtual Visit via Video Note  I connected with Kristin [name] on 10/28/22 at 10:30 AM EDT by a video enabled telemedicine application and verified that I am speaking with the correct person using two identifiers.  Location Provider Location : [facility] Patient Location : Home  Participants: Patient , Provider    I discussed the limitations of evaluation and management by telemedicine and the availability of in person appointments. The patient expressed understanding and agreed to proceed.   I discussed the assessment and treatment plan with the patient. The patient was provided an opportunity to ask questions and all were answered. The patient agreed with the plan and demonstrated an understanding of the instructions.   The patient was advised to call back or seek an in-person evaluation if the symptoms worsen or if the condition fails to improve as anticipated.    [facility] MD OP Progress Note  10/28/2022 10:57 AM Kristin [name]  MRN:  [id number]  Chief Complaint:  Chief Complaint  Patient presents with   Follow-up   Depression   Anxiety   Medication Refill   HPI: Kristin [name] is a 62 year old Caucasian female, married, retired, lives in [place], has a history of GAD, MDD, insomnia, bereavement, diabetes mellitus, polyneuropathy, tendinopathy of right biceps, status post right shoulder arthroscopy , chronic migraine was evaluated by telemedicine today.  Patient today reports she continues to grieve the loss of her mother.  Some days are hard.  She however reports she has 2 other sisters and they live in [place] and they have been keeping in constant touch and supporting each other.  Patient reports she also has her family who are very supportive.  Patient reports certain events can trigger her memories of her mother and make her cry.  She is currently taking it day by day.  She however continues to worry about her son who is currently in [place] which is going to be hit by  the hurricane tomorrow.  She does have hydroxyzine available as needed which she takes when she needs it.  That does seem to help.  She usually takes it when she rides as a passenger in a car or if she knows she is going to have significant anxiety attacks triggered by a certain stressor.  She does have clonazepam available however she has not been using it.  Patient reports she is currently having sleep issues.  She takes the zolpidem at around 8:30 or 9 and falls asleep by 10:30 or 11 PM.  She reports she is able to sleep until 2:30 AM however wakes up after that and spent some time doing needle point.  Patient reports she is able to fall back asleep in 30 minutes and sleep another couple of hours.  When she gets around 6 hours of total sleep.   Patient continues to follow-up with Ms. Felecia Jan and has upcoming appointment.  Patient is compliant on medications as prescribed.  Does report dizziness or lightheadedness when she tries to change her position or get up and walk.  She has been monitoring her blood pressure.  Patient does report she has not been eating much, used to be on [facility] however stopped taking it 2 weeks ago.  She has to force herself to eat sometimes.  She has been making sure she is drinking enough water.  She is planning to talk to her primary care provider.  She denies any current suicidality, homicidality or perceptual disturbances.  Patient denies any other concerns today.  Visit Diagnosis:    ICD-10-CM   1. GAD (generalized anxiety disorder)  F41.1 citalopram (CELEXA) 40 MG tablet    2. MDD (major depressive disorder), recurrent episode, mild (HCC)  F33.0 citalopram (CELEXA) 40 MG tablet    3. Insomnia, unspecified type  G47.00     4. Bereavement  Z63.4       Past Psychiatric History: I have reviewed past psychiatric history from progress note on 04/03/2022.  Past trials of medications like Prozac, trazodone, nortriptyline, amitriptyline for headaches,  neuropathy.  Past Medical History:  Past Medical History:  Diagnosis Date   ADHD    Adhesive capsulitis of right shoulder 07/23/2020   Allergy    Annual physical exam 08/19/2019   Anxiety 12/12/2015   Arthritis    Basal cell carcinoma    nose, left cheek, right upper lip, right posterior shoulder most recent 6 years ago on left cheek   Basal cell carcinoma 03/26/2021   L chest, EDC 04/24/2021   Benign essential hypertension 08/14/2014   Bleeding    Bursitis of right shoulder 12/25/2020   Chicken pox    Chronic diarrhea    Chronic intractable headache 10/20/2017   Chronic migraine w/o aura, not intractable, w/o stat migr 08/08/2020   Complication of anesthesia    blood pressure elevated after gastric sleeve surgery in recovery, pt does not want mask over face; migraines   COVID-19    03/01/20 had MAB 03/03/20 and felt better next day 03/04/20   CTS (carpal tunnel syndrome) 04/05/2020   Dr. Sherryll Burger s/p [facility]/Lidocaine injection 12/05/19   Depression    Diabetes mellitus without complication (HCC)    pre-diabetic currently, was diabetic prior to gastric sleeve surgery    Diabetic polyneuropathy associated with diabetes mellitus due to underlying condition (HCC) 08/08/2020   Disorder of right rotator cuff    10/03/19 MRI right shoulder abnormal Dr. Zettie Cooley   Fall    08/2020   Fall    03/28/21   Fatty liver    Fatty liver 05/06/2017   [place] + 2013    Gout    Hematuria 10/15/2018   History of shingles 06/19/2021   lower back and R side of thigh   Hives 03/28/2020   Hyperlipidemia    Hypertension    no problems since losing weight after gastric sleeve surgery   Hypertension 07/17/2014   IBS (irritable bowel syndrome)    Injury of right wrist 06/26/2016   Insomnia    Migraine    Neuropathy    bilateral feet   Obesity    Obesity (BMI 30-39.9) 05/06/2017   Obesity, diabetes, and hypertension syndrome (HCC) 08/18/2019   Osteopenia    Palpitations 08/14/2014   PONV (postoperative  nausea and vomiting)    Squamous cell carcinoma of skin 03/26/2021   SCC IS R mid sternum, EDC 04/24/2021   Tendonitis of shoulder, right 12/25/2020    Past Surgical History:  Procedure Laterality Date   ABDOMINAL HYSTERECTOMY     no h/o abnormal pap still has 1 ovary left   ANKLE ARTHROSCOPY Left 05/29/2016   Procedure: ANKLE ARTHROSCOPY;  Surgeon: Felecia Shelling, DPM;  [facility]: [facility];  Service: Podiatry;  Laterality: Left;   APPENDECTOMY     basal cell removed     CESAREAN SECTION N/A    1985 & 1995   CHOLECYSTECTOMY     CLOSED MANIPULATION SHOULDER WITH STERIOD INJECTION Right 03/31/2022   Procedure: MANIPULATION UNDER ANESTHESIA WITH GLENOHUMERAL AND SUBACROMIAL CORTICOSTERIOD INJECTION;  Surgeon: Signa Kell, MD;  Location: [facility] ORS;  Service: Orthopedics;  Laterality: Right;   COLONOSCOPY WITH PROPOFOL N/A 01/31/2019   Procedure: COLONOSCOPY WITH PROPOFOL;  Surgeon: Toney Reil, MD;  Location: [facility] ENDOSCOPY;  Service: Gastroenterology;  Laterality: N/A;   cyst removed from wrist     ESOPHAGOGASTRODUODENOSCOPY (EGD) WITH PROPOFOL N/A 01/31/2019   Procedure: ESOPHAGOGASTRODUODENOSCOPY (EGD) WITH PROPOFOL;  Surgeon: Toney Reil, MD;  Location: [facility] ENDOSCOPY;  Service: Gastroenterology;  Laterality: N/A;   ESOPHAGOGASTRODUODENOSCOPY (EGD) WITH PROPOFOL N/A 05/20/2022   Procedure: ESOPHAGOGASTRODUODENOSCOPY (EGD) WITH PROPOFOL;  Surgeon: Toney Reil, MD;  Location: [facility] ENDOSCOPY;  Service: Gastroenterology;  Laterality: N/A;   ESOPHAGOGASTRODUODENOSCOPY (EGD) WITH PROPOFOL N/A 09/24/2022   Procedure: ESOPHAGOGASTRODUODENOSCOPY (EGD) WITH PROPOFOL;  Surgeon: Toney Reil, MD;  Location: [facility] ENDOSCOPY;  Service: Gastroenterology;  Laterality: N/A;   FLEXIBLE SIGMOIDOSCOPY N/A 05/21/2020   Procedure: FLEXIBLE SIGMOIDOSCOPY;  Surgeon: Toney Reil, MD;  Location: [facility] ENDOSCOPY;  Service: Gastroenterology;  Laterality: N/A;   FOOT SURGERY     left foot  11/2016 Dr. Vinnie Level    left elbow surgery     07/2017 Dr Erin Sons epicondylitis    LYSIS OF ADHESION Right 08/27/2021   Procedure: Right shoulder arthroscopic lysis of adhesions, capsular release, and manipulation under anesthesia with corticosteroid injection and biceps tenodesis;  Surgeon: Signa Kell, MD;  Location: [facility];  Service: Orthopedics;  Laterality: Right;   MOHS SURGERY     x 2 face nose and left cheek    OSTEOCHONDROMA EXCISION Left 05/29/2016   Procedure: OSTEOCHONDRAL DRILLING TALUS;  Surgeon: Felecia Shelling, DPM;  [facility]: [facility];  Service: Podiatry;  Laterality: Left;   PLANTAR FASCIA RELEASE Left 05/29/2016   Procedure: ENDOSCOPIC PLANTAR FASCIOTOMY;  Surgeon: Felecia Shelling, DPM;  [facility];  Service: Podiatry;  Laterality: Left;   ROTATOR CUFF REPAIR Left    SHOULDER ARTHROSCOPY WITH BICEPSTENOTOMY Right 03/31/2022   Procedure: SHOULDER ARTHROSCOPY WITH BICEPS TENOTOMY, ARTHROSCOPIC LYSIS OF ADHESIONS, AND CAPSULAR RELEASE, AND EXTENSIVE GLENOHUMERAL DEBRIDEMENT;  Surgeon: Signa Kell, MD;  Location: [facility] ORS;  Service: Orthopedics;  Laterality: Right;   SHOULDER ARTHROSCOPY WITH ROTATOR CUFF REPAIR  03/31/2022   Procedure: SHOULDER ARTHROSCOPY WITH ROTATOR CUFF REPAIR;  Surgeon: Signa Kell, MD;  Location: [facility] ORS;  Service: Orthopedics;;   STOMACH SURGERY  2017   gastric sleeve surgery   TENNIS ELBOW RELEASE/NIRSCHEL PROCEDURE Left 08/05/2017   Procedure: TENNIS ELBOW RELEASE/NIRSCHEL PROCEDURE;  Surgeon: Erin Sons, MD;  Location: [facility] ORS;  Service: Orthopedics;  Laterality: Left;   TUBAL LIGATION     wedge resection of ovary  1982    Family Psychiatric History: I have reviewed family psychiatric history from progress note on 04/03/2022.  Family History:  Family History  Problem Relation Age of Onset   Heart disease Mother    Breast cancer Mother 65       metastatic, now hip, spine, and leg    Arthritis Mother     Hyperlipidemia Mother    Cancer Mother        spinal surgery. breast dx'ed age 37 now metastatic 79 as of 07/13/18   Cystic fibrosis Mother        carrier    Heart disease Father    Lung cancer Father        smoker   Hyperlipidemia Father    Hypertension Father    Depression Sister    Diabetes Sister    Stroke Sister  Other Sister        covid +   Diabetes Sister    Diabetes Maternal Aunt    Alcohol abuse Maternal Aunt    Diabetes Maternal Aunt    Alcohol abuse Maternal Uncle    Diabetes Maternal Uncle    Alcohol abuse Paternal Uncle    Diabetes Maternal Grandfather    Hyperlipidemia Maternal Grandfather    Arthritis Maternal Grandmother    Sudden death Cousin    Schizophrenia Cousin    Cystic fibrosis Other        great niece    Factor V Leiden deficiency Other        uncle and cousins and m GF (all maternal)   Alcohol abuse Niece    Alcohol abuse Nephew    ADD / ADHD Son    Depression Son    ADD / ADHD Son    Post-traumatic stress disorder Son    Colon cancer Neg Hx    Ovarian cancer Neg Hx     Social History: I have reviewed social history from progress note on 04/03/2022. Social History   Socioeconomic History   Marital status: Married    Spouse name: Not on file   Number of children: 3   Years of education: Not on file   Highest education level: Some college, no degree  Occupational History   Not on file  Tobacco Use   Smoking status: Never   Smokeless tobacco: Never  Vaping Use   Vaping status: Never Used  Substance and Sexual Activity   Alcohol use: Never   Drug use: Never   Sexual activity: Yes    Partners: Male    Birth control/protection: Surgical  Other Topics Concern   Not on file  Social History Narrative   Married x 37 years as of 10/2017    Kids 3 boys youngest is Renae Fickle   Write childrens books    Retired from [employer] 01/2017    Enjoys traveling    Right handed   Caffeine: quit 2009   Social Determinants of Health   Financial  Resource Strain: Low Risk  (07/01/2022)   Overall Financial Resource Strain (CARDIA)    Difficulty of Paying Living Expenses: Not very hard  Food Insecurity: No Food Insecurity (08/22/2022)   Hunger Vital Sign    Worried About Running Out of Food in the Last Year: Never true    Ran Out of Food in the Last Year: Never true  Transportation Needs: No Transportation Needs (08/22/2022)   PRAPARE - Administrator, Civil Service (Medical): No    Lack of Transportation (Non-Medical): No  Physical Activity: Insufficiently Active (07/01/2022)   Exercise Vital Sign    Days of Exercise per Week: 2 days    Minutes of Exercise per Session: 20 min  Stress: Stress Concern Present (07/01/2022)   [employer] of Occupational Health - Occupational Stress Questionnaire    Feeling of Stress : Very much  Social Connections: Moderately Integrated (07/01/2022)   Social Connection and Isolation Panel [NHANES]    Frequency of Communication with Friends and Family: Three times a week    Frequency of Social Gatherings with Friends and Family: Twice a week    Attends Religious Services: More than 4 times per year    Active Member of [employer] or Organizations: No    Attends Engineer, structural: Not on file    Marital Status: Married    Allergies:  Allergies  Allergen Reactions  Tetracyclines & Related Swelling and Rash    FACE SWELL, RASH ON CHEST   Amoxicillin    Alcohol Rash    ABDOMINAL CRAMPS, drinking alcohol   Azithromycin Rash   Metoprolol Diarrhea    Metabolic Disorder Labs: Lab Results  Component Value Date   HGBA1C 5.2 07/03/2022   No results found for: "PROLACTIN" Lab Results  Component Value Date   CHOL 151 07/03/2022   TRIG 70.0 07/03/2022   HDL 59.20 07/03/2022   CHOLHDL 3 07/03/2022   VLDL 14.0 07/03/2022   LDLCALC 77 07/03/2022   LDLCALC 53 01/09/2022   Lab Results  Component Value Date   TSH 2.524 08/18/2022   TSH 2.37 01/09/2022    Therapeutic Level  Labs: No results found for: "LITHIUM" No results found for: "VALPROATE" No results found for: "CBMZ"  Current Medications: Current Outpatient Medications  Medication Sig Dispense Refill   acetaminophen (TYLENOL) 500 MG tablet Take 2 tablets (1,000 mg total) by mouth every 8 (eight) hours. 90 tablet 2   Cyanocobalamin (VITAMIN B-12) 1000 MCG SUBL Place 1 tablet (1,000 mcg total) under the tongue daily. 90 tablet 3   fexofenadine (ALLEGRA) 180 MG tablet Take 1 tablet (180 mg total) by mouth daily as needed for allergies or rhinitis. 90 tablet 3   gabapentin (NEURONTIN) 300 MG capsule TAKE 1 CAPSULE BY MOUTH 3 TIMES DAILY AS NEEDED. 90 capsule 5   hydrOXYzine (ATARAX) 25 MG tablet TAKE 0.5-1 TABLETS (12.5-25 MG TOTAL) BY MOUTH 3 (THREE) TIMES DAILY AS NEEDED FOR ANXIETY (AND SLEEP). 90 tablet 1   montelukast (SINGULAIR) 10 MG tablet Take 1 tablet (10 mg total) by mouth at bedtime. 90 tablet 3   omeprazole (PRILOSEC) 40 MG capsule Take 1 capsule (40 mg total) by mouth daily. 90 capsule 3   ondansetron (ZOFRAN-ODT) 4 MG disintegrating tablet Take 1 tablet (4 mg total) by mouth every 8 (eight) hours as needed. for nausea 20 tablet 2   Rimegepant Sulfate (NURTEC) 75 MG TBDP Take 1 tablet (75 mg total) by mouth daily as needed (take for abortive therapy of migraine, no more than 1 tablet in 24 hours or 10 per month). 10 tablet 2   rosuvastatin (CRESTOR) 20 MG tablet Take 1 tablet (20 mg total) by mouth daily. 90 tablet 2   topiramate (TOPAMAX) 50 MG tablet Take 1 tablet (50 mg total) by mouth daily. 90 tablet 3   zolpidem (AMBIEN CR) 6.25 MG CR tablet Take 2 tablets (12.5 mg total) by mouth at bedtime as needed for sleep. 120 tablet 3   Calcium Citrate-Vitamin D 500-10 MG-MCG CHEW Take 1 tablet two times a day (Patient not taking: Reported on 10/29/2022)     citalopram (CELEXA) 40 MG tablet Take 1 tablet (40 mg total) by mouth daily. 90 tablet 1   clonazePAM (KLONOPIN) 0.5 MG tablet Take 0.5-1 tablets  (0.25-0.5 mg total) by mouth daily as needed for anxiety. Limited supply 10 tablet 0   tirzepatide (MOUNJARO) 2.5 MG/0.5ML Pen INJECT 2.5 MG SUBCUTANEOUSLY WEEKLY (Patient not taking: Reported on 10/29/2022) 6 mL 1   No current facility-administered medications for this visit.     Musculoskeletal: Strength & Muscle Tone:  UTA Gait & Station:  Seated Patient leans: N/A  Psychiatric Specialty Exam: Review of Systems  Neurological:  Positive for light-headedness.  Psychiatric/Behavioral:         Grieving    There were no vitals taken for this visit.There is no height or weight on file to calculate  BMI.  General Appearance: Fairly Groomed  Eye Contact:  Fair  Speech:  Clear and Coherent  Volume:  Normal  Mood:   grieving  Affect:  Tearful  Thought Process:  Goal Directed and Descriptions of Associations: Intact  Orientation:  Full (Time, Place, and Person)  Thought Content: Logical   Suicidal Thoughts:  No  Homicidal Thoughts:  No  Memory:  Immediate;   Fair Recent;   Fair Remote;   Fair  Judgement:  Fair  Insight:  Fair  Psychomotor Activity:  Normal  Concentration:  Concentration: Fair and Attention Span: Fair  Recall:  [employer] of Knowledge: Fair  Language: Fair  Akathisia:  No  Handed:  Right  AIMS (if indicated): not done  Assets:  Communication Skills Desire for Improvement Housing Social Support  ADL's:  Intact  Cognition: WNL  Sleep:  Fair   Screenings: GAD-7    Flowsheet Row Video Visit from 09/25/2022 in [facility] [employer] Visit from 09[facility]23/2022 in [facility] Office Visit from 07/02/2022 in [facility] [employer] Visit from 04/03/2022 in [facility] Office Visit from 03/03/2022 in [facility] [employer]  Total GAD-7 Score 0 16 15 18 16       PHQ2-9    Flowsheet Row Video  Visit from 09/25/2022 in [facility] [employer] Visit from 09/23/2022 in [facility] Office Visit from 07/02/2022 in [facility] Visit from 04[facility]03/2022 in [facility] Office Visit from 03/03/2022 in [facility] Total Score 0 4 4 4 4   PHQ-9 Total Score 0 19 10 21 15       Flowsheet Row Video Visit from 10/28/2022 in [facility] Most recent reading at 10/29/2022 12:55 PM Office Visit from 09/23/2022 in [facility] Most recent reading at 09/24/2022  2:52 PM Admission (Discharged) from 09/24/2022 in [facility] Most recent reading at 09/24/2022  9:33 AM  C-SSRS RISK CATEGORY No Risk No Risk No Risk        Assessment and Plan: Kristin [name] is a 62 year old Caucasian female, married, retired, lives in [place], has a history of anxiety, depression, multiple medical problems, chronic pain, bereavement was evaluated by telemedicine today.  Patient continues to grieve the loss of her mother as well as reports anxiety mostly situational, will benefit from continued medication management, psychotherapy sessions and grief counseling.  Plan as noted below.  Plan GAD-improving Celexa 40 mg p.o. daily Hydroxyzine 12.5-25 mg p.o. twice daily as needed  MDD-improving Celexa 40 mg p.o. daily Continue CBT with Ms. Felecia Jan.  Insomnia-improving Patient could use hydroxyzine 25 mg or melatonin 2 mg over-the-counter along with Ambien. Ambien extended release 12.5 mg p.o. nightly for now. Long-term plan to reduce or taper off the Ambien. 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 Felecia Jan Klonopin  0.25-0.5 mg as needed for severe anxiety/grief reaction Patient has not been using it. Reviewed [place] PMP AWARxE  Patient with current dizziness likely multifactorial as well as medication-induced, patient is on polypharmacy.  Patient to monitor closely, follow up with primary care provider.  Will continue to taper down medications if needed.  Collaboration of Care: Collaboration of Care: Referral or follow-up with counselor/therapist AEB encouraged continued CBT  Patient/Guardian was advised Release of Information must be obtained prior to any record release in order to collaborate their care with an outside provider. Patient/Guardian was advised if they have not already done so to contact the registration department to sign all necessary forms in order for [place] to release information regarding their care.   Consent: Patient/Guardian gives verbal consent for treatment and assignment of benefits for services provided during this visit. Patient/Guardian expressed understanding and agreed to proceed.   Follow-up in clinic in 3 months or sooner in person  This note was generated in part or whole with voice recognition software. Voice recognition is usually quite accurate but there are transcription errors that can and very often do occur. I apologize for any typographical errors that were not detected and corrected.    Jomarie Longs, MD 10/29/2022, 12:55 PM

## 2022-10-29 ENCOUNTER — Encounter: Payer: Self-pay | Admitting: Family Medicine

## 2022-10-29 ENCOUNTER — Telehealth (INDEPENDENT_AMBULATORY_CARE_PROVIDER_SITE_OTHER): Payer: BC Managed Care – PPO | Admitting: Family Medicine

## 2022-10-29 VITALS — BP 85/57 | HR 79 | Ht 62.0 in | Wt 112.0 lb

## 2022-10-29 DIAGNOSIS — F39 Unspecified mood [affective] disorder: Secondary | ICD-10-CM | POA: Diagnosis not present

## 2022-10-29 DIAGNOSIS — Z634 Disappearance and death of family member: Secondary | ICD-10-CM

## 2022-10-29 DIAGNOSIS — I951 Orthostatic hypotension: Secondary | ICD-10-CM

## 2022-10-29 DIAGNOSIS — R634 Abnormal weight loss: Secondary | ICD-10-CM | POA: Diagnosis not present

## 2022-10-29 NOTE — Progress Notes (Signed)
Virtual Visit via Video note  I connected with Kristin [name] on 11/02/22 at 0947 by video and verified that I am speaking with the correct person using two identifiers. Kristin [name] is currently located at home and  is currently with alone during visit. The provider, Dana Allan, MD is located in their home at time of visit.  I discussed the limitations, risks, security and privacy concerns of performing an evaluation and management service by video and the availability of in person appointments. I also discussed with the patient that there may be a patient responsible charge related to this service. The patient expressed understanding and agreed to proceed.  Subjective: PCP: Dana Allan, MD  Chief Complaint  Patient presents with   Hypertension    HPI  Discussed the use of AI scribe software for clinical note transcription with the patient, who gave verbal consent to proceed.  History of Present Illness The patient presents with a chief complaint of dizziness, nausea, and near syncope, particularly when overheated. They have been monitoring their blood pressure, which they report fluctuates greatly. The patient notes that their blood pressure is typically within normal limits when lying flat, but drops upon standing and moving around. On one occasion, the patient reported a blood pressure of 89/58 while moving around in the kitchen, which was associated with near syncope. Upon lying down, their blood pressure increased to 140/90.  The patient also reports a significant decrease in appetite and subsequent weight loss, with a current weight of 112 lbs, down from a previous weight of 109 lbs. They express a lack of interest in cooked food, preferring raw vegetables. The patient denies intentional weight loss and attributes the change in appetite and weight to recent bereavement, having lost their mother in September.  The patient has a history of anxiety, for which they were  prescribed Klonopin for a short period during a time of increased travel. They report that they have four out of the five prescribed tablets remaining.  The patient denies chest pain during episodes of low blood pressure but reports occasional back pain. They have a history of a visit to the emergency room for similar symptoms of dizziness and low blood pressure, but by the time of evaluation, their blood pressure had returned to normal. The patient has not seen a cardiologist in several years.  The patient is currently on Ambien for sleep, but reports broken sleep patterns and exhaustion. They note that they often need to lie down due to feeling 'washed out' following dizzy spells. Despite this, the patient remains active, engaging in activities such as reading and needlepoint during periods of wakefulness at night.     ROS: Per HPI  Current Outpatient Medications:    acetaminophen (TYLENOL) 500 MG tablet, Take 2 tablets (1,000 mg total) by mouth every 8 (eight) hours., Disp: 90 tablet, Rfl: 2   citalopram (CELEXA) 40 MG tablet, Take 1 tablet (40 mg total) by mouth daily., Disp: 90 tablet, Rfl: 1   clonazePAM (KLONOPIN) 0.5 MG tablet, Take 0.5-1 tablets (0.25-0.5 mg total) by mouth daily as needed for anxiety. Limited supply, Disp: 10 tablet, Rfl: 0   Cyanocobalamin (VITAMIN B-12) 1000 MCG SUBL, Place 1 tablet (1,000 mcg total) under the tongue daily., Disp: 90 tablet, Rfl: 3   fexofenadine (ALLEGRA) 180 MG tablet, Take 1 tablet (180 mg total) by mouth daily as needed for allergies or rhinitis., Disp: 90 tablet, Rfl: 3   gabapentin (NEURONTIN) 300 MG capsule, TAKE 1  CAPSULE BY MOUTH 3 TIMES DAILY AS NEEDED., Disp: 90 capsule, Rfl: 5   hydrOXYzine (ATARAX) 25 MG tablet, TAKE 0.5-1 TABLETS (12.5-25 MG TOTAL) BY MOUTH 3 (THREE) TIMES DAILY AS NEEDED FOR ANXIETY (AND SLEEP)., Disp: 90 tablet, Rfl: 1   montelukast (SINGULAIR) 10 MG tablet, Take 1 tablet (10 mg total) by mouth at bedtime., Disp: 90  tablet, Rfl: 3   omeprazole (PRILOSEC) 40 MG capsule, Take 1 capsule (40 mg total) by mouth daily., Disp: 90 capsule, Rfl: 3   ondansetron (ZOFRAN-ODT) 4 MG disintegrating tablet, Take 1 tablet (4 mg total) by mouth every 8 (eight) hours as needed. for nausea, Disp: 20 tablet, Rfl: 2   Rimegepant Sulfate (NURTEC) 75 MG TBDP, Take 1 tablet (75 mg total) by mouth daily as needed (take for abortive therapy of migraine, no more than 1 tablet in 24 hours or 10 per month)., Disp: 10 tablet, Rfl: 2   rosuvastatin (CRESTOR) 20 MG tablet, Take 1 tablet (20 mg total) by mouth daily., Disp: 90 tablet, Rfl: 2   topiramate (TOPAMAX) 50 MG tablet, Take 1 tablet (50 mg total) by mouth daily., Disp: 90 tablet, Rfl: 3   zolpidem (AMBIEN CR) 6.25 MG CR tablet, Take 2 tablets (12.5 mg total) by mouth at bedtime as needed for sleep., Disp: 120 tablet, Rfl: 3   Calcium Citrate-Vitamin D 500-10 MG-MCG CHEW, Take 1 tablet two times a day, Disp: , Rfl:    tirzepatide (MOUNJARO) 2.5 MG/0.5ML Pen, INJECT 2.5 MG SUBCUTANEOUSLY WEEKLY, Disp: 6 mL, Rfl: 1  Observations/Objective: Physical Exam Pulmonary:     Effort: Pulmonary effort is normal.  Neurological:     Mental Status: She is alert and oriented to person, place, and time. Mental status is at baseline.  Psychiatric:        Mood and Affect: Mood normal.        Behavior: Behavior normal.        Thought Content: Thought content normal.        Judgment: Judgment normal.    Assessment and Plan: Orthostatic hypotension Assessment & Plan: Reports of dizziness, near syncope, and nausea, particularly when overheated or moving around. Blood pressure readings fluctuate greatly, with low readings when standing and high readings when supine. Possible contributing factors include recent weight loss and decreased food intake Considered vasovagal given episodes also occur when overheated and with movement. -Schedule in-office visit for further evaluation including orthostatic  blood pressure measurements, EKG, and blood work. -Increase fluid intake and take time when changing positions to mitigate symptoms. -Consider referral to a cardiologist depending on in-office findings.   Loss of weight Assessment & Plan: Reports unintentional weight loss and decreased appetite, particularly for cooked foods. Recent bereavement (loss of mother) may be contributing to decreased appetite. -Check A1C and other labs during in-office visit. -Continue monitoring weight and food intake. -Self discontinue Mounjaro for now   Mood disorder [facility]) Assessment & Plan: Reports of high anxiety, particularly when traveling or in crowded places. Currently has a prescription for Klonopin (clonazepam) but only uses it sparingly. -Continue current management with counseling and as-needed Klonopin. -Consider potential impact of Ambien on blood pressure and sleep patterns.  -Continue to follow up with psychiatry, may need adjustment in medication given weight loss.   Bereavement Assessment & Plan: Recently loss mother Has not been coping well. Decreased appetite and weight loss Has good social support Follows with psychiatry and has had some adjustments in medications recently Recommend she continue to follow up with  psychiatry    Follow Up Instructions: Return in about 1 day (around 10/30/2022) for PCP.   I discussed the assessment and treatment plan with the patient. The patient was provided an opportunity to ask questions and all were answered. The patient agreed with the plan and demonstrated an understanding of the instructions.   The patient was advised to call back or seek an in-person evaluation if the symptoms worsen or if the condition fails to improve as anticipated.  The above assessment and management plan was discussed with the patient. The patient verbalized understanding of and has agreed to the management plan. Patient is aware to call the clinic if symptoms persist  or worsen. Patient is aware when to return to the clinic for a follow-up visit. Patient educated on when it is appropriate to go to the emergency department.    Dana Allan, MD

## 2022-10-30 ENCOUNTER — Encounter: Payer: Self-pay | Admitting: Family Medicine

## 2022-10-30 ENCOUNTER — Ambulatory Visit (INDEPENDENT_AMBULATORY_CARE_PROVIDER_SITE_OTHER): Payer: BC Managed Care – PPO | Admitting: Family Medicine

## 2022-10-30 VITALS — BP 92/58 | HR 70 | Temp 98.0°F | Resp 16 | Ht 62.0 in | Wt 111.5 lb

## 2022-10-30 DIAGNOSIS — F411 Generalized anxiety disorder: Secondary | ICD-10-CM | POA: Diagnosis not present

## 2022-10-30 DIAGNOSIS — G43711 Chronic migraine without aura, intractable, with status migrainosus: Secondary | ICD-10-CM

## 2022-10-30 DIAGNOSIS — F331 Major depressive disorder, recurrent, moderate: Secondary | ICD-10-CM | POA: Diagnosis not present

## 2022-10-30 DIAGNOSIS — R42 Dizziness and giddiness: Secondary | ICD-10-CM | POA: Diagnosis not present

## 2022-10-30 DIAGNOSIS — R300 Dysuria: Secondary | ICD-10-CM | POA: Diagnosis not present

## 2022-10-30 DIAGNOSIS — R0789 Other chest pain: Secondary | ICD-10-CM

## 2022-10-30 DIAGNOSIS — I951 Orthostatic hypotension: Secondary | ICD-10-CM | POA: Diagnosis not present

## 2022-10-30 DIAGNOSIS — Z23 Encounter for immunization: Secondary | ICD-10-CM

## 2022-10-30 DIAGNOSIS — F901 Attention-deficit hyperactivity disorder, predominantly hyperactive type: Secondary | ICD-10-CM | POA: Diagnosis not present

## 2022-10-30 DIAGNOSIS — R630 Anorexia: Secondary | ICD-10-CM

## 2022-10-30 LAB — COMPREHENSIVE METABOLIC PANEL
ALT: 16 U/L (ref 0–35)
AST: 20 U/L (ref 0–37)
Albumin: 4.4 g/dL (ref 3.5–5.2)
Alkaline Phosphatase: 69 U/L (ref 39–117)
BUN: 16 mg/dL (ref 6–23)
CO2: 27 meq/L (ref 19–32)
Calcium: 9.2 mg/dL (ref 8.4–10.5)
Chloride: 108 meq/L (ref 96–112)
Creatinine, Ser: 0.82 mg/dL (ref 0.40–1.20)
GFR: 76.39 mL/min (ref >60.00)
Glucose, Bld: 88 mg/dL (ref 70–99)
Potassium: 3.9 meq/L (ref 3.5–5.1)
Sodium: 141 meq/L (ref 135–145)
Total Bilirubin: 0.5 mg/dL (ref 0.2–1.2)
Total Protein: 6.6 g/dL (ref 6.0–8.3)

## 2022-10-30 LAB — CBC
HCT: 44.9 % (ref 36.0–46.0)
Hemoglobin: 14.7 g/dL (ref 12.0–15.0)
MCHC: 32.7 g/dL (ref 30.0–36.0)
MCV: 90.6 fL (ref 78.0–100.0)
Platelets: 252 10*3/uL (ref 150.0–400.0)
RBC: 4.95 Mil/uL (ref 3.87–5.11)
RDW: 13.7 % (ref 11.5–15.5)
WBC: 5.9 10*3/uL (ref 4.0–10.5)

## 2022-10-30 LAB — IBC + FERRITIN
Ferritin: 108.2 ng/mL (ref 10.0–291.0)
Iron: 115 ug/dL (ref 42–145)
Saturation Ratios: 32.9 % (ref 20.0–50.0)
TIBC: 350 ug/dL (ref 250.0–450.0)
Transferrin: 250 mg/dL (ref 212.0–360.0)

## 2022-10-30 LAB — POCT URINALYSIS DIP (CLINITEK)
Bilirubin, UA: NEGATIVE
Blood, UA: NEGATIVE
Glucose, UA: NEGATIVE mg/dL
Ketones, POC UA: NEGATIVE mg/dL
Leukocytes, UA: NEGATIVE
Nitrite, UA: NEGATIVE
POC PROTEIN,UA: NEGATIVE
Spec Grav, UA: >=1.03 — AB (ref 1.010–1.025)
Urobilinogen, UA: 0.2 U/dL
pH, UA: 6.5 (ref 5.0–8.0)

## 2022-10-30 LAB — TSH: TSH: 1.62 u[IU]/mL (ref 0.35–5.50)

## 2022-10-30 NOTE — Progress Notes (Signed)
SUBJECTIVE:   Chief Complaint  Patient presents with   Hypertension   HPI Presents to clinic for follow up from video visit 10/09   Discussed the use of AI scribe software for clinical note transcription with the patient, who gave verbal consent to proceed.  History of Present Illness The patient, with a history of hypertension and recent bereavement, presents with dizziness and a drop in blood pressure. They report a decrease in appetite, preferring vegetables over proteins, and have recently discontinued the medication Tirzepatide. The patient's blood pressure drops by ten upon standing, although they are not currently on any blood pressure medications.  The patient also reports occasional chest tightness, which began during the summer, and is sometimes accompanied by pain in the shoulder blade. They describe episodes of dizziness, often accompanied by a splitting headache and nausea, which occur after standing up from a sitting or lying position. These episodes are not immediate but occur after the patient has been standing for a while. During these episodes, the patient sees 'stars' and their blood pressure drops significantly. The symptoms usually resolve within 10-15 minutes of lying down, although the headache may persist longer.  The patient has a history of migraines, but they report that the headaches associated with these episodes are different and resolve without medication. They also report burning during urination, suggesting a possible urinary tract infection.    PERTINENT PMH / PSH: As above  OBJECTIVE:  BP (!) 92/58   Pulse 70   Temp 98 F (36.7 C)   Resp 16   Ht 5\' 2"  (1.575 m)   Wt 111 lb 8 oz (50.6 kg)   SpO2 98%   BMI 20.39 kg/m    Physical Exam Vitals reviewed.  Constitutional:      General: She is not in acute distress.    Appearance: She is not ill-appearing.  HENT:     Head: Normocephalic.     Right Ear: Tympanic membrane, ear canal and external  ear normal.     Left Ear: Tympanic membrane, ear canal and external ear normal.     Nose: Nose normal.     Mouth/Throat:     Mouth: Mucous membranes are moist.  Eyes:     Extraocular Movements: Extraocular movements intact.     Conjunctiva/sclera: Conjunctivae normal.     Pupils: Pupils are equal, round, and reactive to light.  Neck:     Vascular: No carotid bruit.  Cardiovascular:     Rate and Rhythm: Normal rate and regular rhythm.     Pulses: Normal pulses.     Heart sounds: Normal heart sounds.  Pulmonary:     Effort: Pulmonary effort is normal.     Breath sounds: Normal breath sounds.  Abdominal:     General: Bowel sounds are normal. There is no distension.     Palpations: Abdomen is soft.     Tenderness: There is no abdominal tenderness. There is no right CVA tenderness, left CVA tenderness, guarding or rebound.  Musculoskeletal:        General: Normal range of motion.     Cervical back: Normal range of motion.     Right lower leg: No edema.     Left lower leg: No edema.  Lymphadenopathy:     Cervical: No cervical adenopathy.  Skin:    Capillary Refill: Capillary refill takes less than 2 seconds.  Neurological:     General: No focal deficit present.     Mental Status: She is alert  and oriented to person, place, and time. Mental status is at baseline.     Motor: No weakness.  Psychiatric:        Mood and Affect: Mood normal.        Behavior: Behavior normal.        Thought Content: Thought content normal.        Judgment: Judgment normal.          10/30/2022   10:52 AM 09/25/2022    4:33 PM 09/23/2022    4:16 PM 07/02/2022    2:49 PM 04/03/2022    5:32 PM  Depression screen PHQ 2/9  Decreased Interest 2 0  2   Down, Depressed, Hopeless 2 0  2   PHQ - 2 Score 4 0  4   Altered sleeping 2 0  2   Tired, decreased energy 3 0  1   Change in appetite 3 0  0   Feeling bad or failure about yourself  1 0  2   Trouble concentrating 2 0  1   Moving slowly or  fidgety/restless 0 0  0   Suicidal thoughts 0 0  0   PHQ-9 Score 15 0  10   Difficult doing work/chores Very difficult Not difficult at all  Very difficult      Information is confidential and restricted. Go to Review Flowsheets to unlock data.      10/30/2022   10:53 AM 09/25/2022    4:33 PM 09/23/2022    4:17 PM 07/02/2022    2:49 PM  GAD 7 : Generalized Anxiety Score  Nervous, Anxious, on Edge 3 0  3  Control/stop worrying 3 0  3  Worry too much - different things 3 0  3  Trouble relaxing 2 0  2  Restless 0 0  0  Easily annoyed or irritable 0 0  1  Afraid - awful might happen 2 0  3  Total GAD 7 Score 13 0  15  Anxiety Difficulty Very difficult Not difficult at all  Very difficult     Information is confidential and restricted. Go to Review Flowsheets to unlock data.    ASSESSMENT/PLAN:  Orthostatic hypotension Assessment & Plan: Blood pressure drops by 10 mmHg standing. No significant symptoms of dizziness or syncope. No current antihypertensive medications. Recently seen in ED for similar symptoms and treated with hydration.  -Continue monitoring blood pressure at home. -Encourage adequate hydration and slow positional changes. -EKG: sinus bradycardia.  -Check electrolytes, CBC, TSH, urine for underlying etiology -Cardiac exam normal   Dizzinesses -     EKG 12-Lead -     Comprehensive metabolic panel -     CBC -     IBC + Ferritin -     TSH  Dysuria Assessment & Plan: Reports of dysuria. -Order urinalysis to rule out UTI.  Orders: -     POCT URINALYSIS DIP (CLINITEK)  Need for influenza vaccination -     Flu vaccine trivalent PF, 6mos and older(Flulaval,Afluria,Fluarix,Fluzone)  Poor appetite Assessment & Plan: Decreased appetite, primarily consuming vegetables. Recently stopped Tirzepatide. -Encourage balanced diet including protein. -Likely secondary to recent loss of parent and continued grieving. -Check Cmet, CBC, Iron panel   Chronic migraine  without aura, with intractable migraine, so stated, with status migrainosus Assessment & Plan: Reports of headaches different from typical migraines, associated with dizziness and nausea. No neurological deficits on exam.  Do not think imaging needed at this time. -Continue current migraine management.  Chest discomfort Assessment & Plan: Reports of new onset chest tightness and seeing stars for 4-5 months. No associated nausea or vomiting.  Was seen in ED for similar symptoms and reports that cardiac etiology ruled out -Order EKG and blood work to rule out cardiac etiology. -Refer to cardiology for further evaluation      PDMP reviewed  Return if symptoms worsen or fail to improve, for PCP.  Dana Allan, MD

## 2022-10-30 NOTE — Patient Instructions (Addendum)
It was a pleasure meeting you today. Thank you for allowing me to take part in your health care.  Our goals for today as we discussed include:  We will get some labs today.  If they are abnormal or we need to do something about them, I will call you.  If they are normal, I will send you a message on MyChart (if it is active) or a letter in the mail.  If you don't hear from [place] in 2 weeks, please call the office at the number below.   EKG today did not show any changes from previous  Increase meals and fluids Recommend use of compression stockings  If no improvement follow up with PCP  If you have any questions or concerns, please do not hesitate to call the office at [phone].  I look forward to our next visit and until then take care and stay safe.  Regards,   Dana Allan, MD   [facility]

## 2022-11-02 ENCOUNTER — Encounter: Payer: Self-pay | Admitting: Family Medicine

## 2022-11-02 DIAGNOSIS — R300 Dysuria: Secondary | ICD-10-CM | POA: Insufficient documentation

## 2022-11-02 DIAGNOSIS — R42 Dizziness and giddiness: Secondary | ICD-10-CM | POA: Insufficient documentation

## 2022-11-02 DIAGNOSIS — R634 Abnormal weight loss: Secondary | ICD-10-CM | POA: Insufficient documentation

## 2022-11-02 DIAGNOSIS — R630 Anorexia: Secondary | ICD-10-CM | POA: Insufficient documentation

## 2022-11-02 DIAGNOSIS — Z23 Encounter for immunization: Secondary | ICD-10-CM | POA: Insufficient documentation

## 2022-11-02 DIAGNOSIS — R0789 Other chest pain: Secondary | ICD-10-CM | POA: Insufficient documentation

## 2022-11-02 DIAGNOSIS — I951 Orthostatic hypotension: Secondary | ICD-10-CM | POA: Insufficient documentation

## 2022-11-02 NOTE — Patient Instructions (Signed)
It was a pleasure meeting you today. Thank you for allowing me to take part in your health care.  Our goals for today as we discussed include:  Increase water intake  Follow up tomorrow in clinic for evaluation and blood work   If you have any questions or concerns, please do not hesitate to call the office at [phone].  I look forward to our next visit and until then take care and stay safe.  Regards,   Dana Allan, MD   [facility]

## 2022-11-02 NOTE — Assessment & Plan Note (Addendum)
Reports unintentional weight loss and decreased appetite, particularly for cooked foods. Recent bereavement (loss of mother) may be contributing to decreased appetite. -Check A1C and other labs during in-office visit. -Continue monitoring weight and food intake. -Self discontinue Mounjaro for now

## 2022-11-02 NOTE — Assessment & Plan Note (Signed)
Reports of dysuria. -Order urinalysis to rule out UTI.

## 2022-11-02 NOTE — Assessment & Plan Note (Addendum)
Blood pressure drops by 10 mmHg standing. No significant symptoms of dizziness or syncope. No current antihypertensive medications. Recently seen in ED for similar symptoms and treated with hydration.  -Continue monitoring blood pressure at home. -Encourage adequate hydration and slow positional changes. -EKG: sinus bradycardia.  -Check electrolytes, CBC, TSH, urine for underlying etiology -Cardiac exam normal

## 2022-11-02 NOTE — Addendum Note (Signed)
Addended by: Enid Cutter on: 11/02/2022 05:44 PM   Modules accepted: Level of Service

## 2022-11-02 NOTE — Assessment & Plan Note (Addendum)
Reports of new onset chest tightness and seeing stars for 4-5 months. No associated nausea or vomiting.  Was seen in ED for similar symptoms and reports that cardiac etiology ruled out -Order EKG and blood work to rule out cardiac etiology. -Refer to cardiology for further evaluation

## 2022-11-02 NOTE — Assessment & Plan Note (Addendum)
Reports of dizziness, near syncope, and nausea, particularly when overheated or moving around. Blood pressure readings fluctuate greatly, with low readings when standing and high readings when supine. Possible contributing factors include recent weight loss and decreased food intake Considered vasovagal given episodes also occur when overheated and with movement. -Schedule in-office visit for further evaluation including orthostatic blood pressure measurements, EKG, and blood work. -Increase fluid intake and take time when changing positions to mitigate symptoms. -Consider referral to a cardiologist depending on in-office findings.

## 2022-11-02 NOTE — Assessment & Plan Note (Signed)
Reports of headaches different from typical migraines, associated with dizziness and nausea. No neurological deficits on exam.  Do not think imaging needed at this time. -Continue current migraine management.

## 2022-11-02 NOTE — Assessment & Plan Note (Signed)
Recently loss mother Has not been coping well. Decreased appetite and weight loss Has good social support Follows with psychiatry and has had some adjustments in medications recently Recommend she continue to follow up with psychiatry

## 2022-11-02 NOTE — Assessment & Plan Note (Signed)
Reports of high anxiety, particularly when traveling or in crowded places. Currently has a prescription for Klonopin (clonazepam) but only uses it sparingly. -Continue current management with counseling and as-needed Klonopin. -Consider potential impact of Ambien on blood pressure and sleep patterns.  -Continue to follow up with psychiatry, may need adjustment in medication given weight loss.

## 2022-11-02 NOTE — Assessment & Plan Note (Addendum)
Decreased appetite, primarily consuming vegetables. Recently stopped Tirzepatide. -Encourage balanced diet including protein. -Likely secondary to recent loss of parent and continued grieving. -Check Cmet, CBC, Iron panel

## 2022-11-20 DIAGNOSIS — F411 Generalized anxiety disorder: Secondary | ICD-10-CM | POA: Diagnosis not present

## 2022-11-20 DIAGNOSIS — F331 Major depressive disorder, recurrent, moderate: Secondary | ICD-10-CM | POA: Diagnosis not present

## 2022-11-20 DIAGNOSIS — F901 Attention-deficit hyperactivity disorder, predominantly hyperactive type: Secondary | ICD-10-CM | POA: Diagnosis not present

## 2022-12-10 DIAGNOSIS — F411 Generalized anxiety disorder: Secondary | ICD-10-CM | POA: Diagnosis not present

## 2022-12-10 DIAGNOSIS — F331 Major depressive disorder, recurrent, moderate: Secondary | ICD-10-CM | POA: Diagnosis not present

## 2022-12-10 DIAGNOSIS — F901 Attention-deficit hyperactivity disorder, predominantly hyperactive type: Secondary | ICD-10-CM | POA: Diagnosis not present

## 2022-12-10 MED FILL — Tirzepatide Soln Auto-injector 2.5 MG/0.5ML: SUBCUTANEOUS | 28 days supply | Qty: 2 | Fill #5 | Status: AC

## 2022-12-15 ENCOUNTER — Telehealth: Payer: Self-pay

## 2022-12-15 DIAGNOSIS — F411 Generalized anxiety disorder: Secondary | ICD-10-CM

## 2022-12-15 DIAGNOSIS — G4700 Insomnia, unspecified: Secondary | ICD-10-CM

## 2022-12-15 MED ORDER — HYDROXYZINE HCL 25 MG PO TABS: 12.5000 mg | ORAL_TABLET | Freq: Three times a day (TID) | ORAL | 2 refills | Status: DC | PRN

## 2022-12-15 NOTE — Telephone Encounter (Signed)
Received fax from [facility] [address] requesting a refill for the following medication, called pharmacy spoke to [place] she stated that the patient does not have any refills on file please advise  hydrOXYzine (ATARAX) 25 MG tablet   Preferred pharmacy [id number] [place], [place] [address] Phone: [phone]  Fax: [phone]

## 2022-12-15 NOTE — Telephone Encounter (Signed)
I have sent hydroxyzine to [facility].

## 2022-12-16 ENCOUNTER — Other Ambulatory Visit: Payer: Self-pay

## 2022-12-17 ENCOUNTER — Other Ambulatory Visit: Payer: Self-pay

## 2022-12-17 ENCOUNTER — Telehealth: Payer: Self-pay | Admitting: Family Medicine

## 2022-12-17 DIAGNOSIS — G4709 Other insomnia: Secondary | ICD-10-CM

## 2022-12-17 NOTE — Telephone Encounter (Signed)
Patient states she is following-up on her previous medication refill request.  Patient states she is leaving the state to go to [place] on Friday morning.

## 2022-12-17 NOTE — Telephone Encounter (Signed)
Called pt and let her know that the pharmacy has sent the [facility] [place] due to not having it in [place].

## 2022-12-17 NOTE — Telephone Encounter (Signed)
Pt called stating her pharmacy does not have any AMBIEN in so she [facility]

## 2022-12-17 NOTE — Telephone Encounter (Signed)
Error

## 2023-01-01 ENCOUNTER — Ambulatory Visit: Payer: BC Managed Care – PPO | Admitting: Family Medicine

## 2023-01-01 VITALS — BP 112/66 | HR 60 | Temp 97.9°F | Resp 18 | Ht 62.0 in | Wt 121.4 lb

## 2023-01-01 DIAGNOSIS — F5101 Primary insomnia: Secondary | ICD-10-CM

## 2023-01-01 DIAGNOSIS — M545 Low back pain, unspecified: Secondary | ICD-10-CM | POA: Diagnosis not present

## 2023-01-01 DIAGNOSIS — J309 Allergic rhinitis, unspecified: Secondary | ICD-10-CM

## 2023-01-01 DIAGNOSIS — E1169 Type 2 diabetes mellitus with other specified complication: Secondary | ICD-10-CM | POA: Diagnosis not present

## 2023-01-01 DIAGNOSIS — E118 Type 2 diabetes mellitus with unspecified complications: Secondary | ICD-10-CM | POA: Diagnosis not present

## 2023-01-01 DIAGNOSIS — Z7985 Long-term (current) use of injectable non-insulin antidiabetic drugs: Secondary | ICD-10-CM

## 2023-01-01 DIAGNOSIS — R350 Frequency of micturition: Secondary | ICD-10-CM

## 2023-01-01 DIAGNOSIS — E785 Hyperlipidemia, unspecified: Secondary | ICD-10-CM

## 2023-01-01 DIAGNOSIS — R7989 Other specified abnormal findings of blood chemistry: Secondary | ICD-10-CM

## 2023-01-01 LAB — POCT URINALYSIS DIP (CLINITEK)
Bilirubin, UA: NEGATIVE
Glucose, UA: NEGATIVE mg/dL
Ketones, POC UA: NEGATIVE mg/dL
Leukocytes, UA: NEGATIVE
Nitrite, UA: NEGATIVE
POC PROTEIN,UA: NEGATIVE
Spec Grav, UA: 1.015 (ref 1.010–1.025)
Urobilinogen, UA: 0.2 U/dL
pH, UA: 7 (ref 5.0–8.0)

## 2023-01-01 LAB — POCT GLYCOSYLATED HEMOGLOBIN (HGB A1C): Hemoglobin A1C: 4.9 % (ref 4.0–5.6)

## 2023-01-01 MED ORDER — MOUNJARO 2.5 MG/0.5ML ~~LOC~~ SOAJ
SUBCUTANEOUS | 1 refills | Status: DC
Start: 2023-01-01 — End: 2023-06-30

## 2023-01-01 MED ORDER — FEXOFENADINE HCL 180 MG PO TABS
180.0000 mg | ORAL_TABLET | Freq: Every day | ORAL | 3 refills | Status: DC | PRN
Start: 2023-01-01 — End: 2023-06-30

## 2023-01-01 MED ORDER — VITAMIN B-12 1000 MCG SL SUBL: 1000.0000 ug | SUBLINGUAL_TABLET | Freq: Every day | SUBLINGUAL | 3 refills | Status: DC

## 2023-01-01 MED ORDER — GABAPENTIN 100 MG PO CAPS: 100.0000 mg | ORAL_CAPSULE | ORAL | 0 refills | Status: DC

## 2023-01-01 MED ORDER — ROSUVASTATIN CALCIUM 20 MG PO TABS
20.0000 mg | ORAL_TABLET | Freq: Every day | ORAL | 2 refills | Status: DC
Start: 2023-01-01 — End: 2023-06-30

## 2023-01-01 NOTE — Patient Instructions (Addendum)
It was a pleasure meeting you today. Thank you for allowing me to take part in your health care.  Our goals for today as we discussed include:  Urine did not show infection.  Trace blood.  Not concerning and can recheck at next visit  Take Gabapentin 100 mg in the morning and continue Gabapentin 300 mg at night.  If no improvement in back pain can image back.  Last imagining showed mild degenerative disc and likely now flare up.  Refills sent for requested medications  This is a list of the screening recommended for you and due dates:  Health Maintenance  Topic Date Due   COVID-19 Vaccine (7 - 2024-25 season) 09/21/2022   Yearly kidney health urinalysis for diabetes  01/10/2023   DTaP/Tdap/Td vaccine (3 - Td or Tdap) 01/09/2023   Hemoglobin A1C  07/02/2023   Eye exam for diabetics  08/04/2023   Yearly kidney function blood test for diabetes  10/30/2023   Pap with HPV screening  08/18/2024   Mammogram  10/26/2024   Colon Cancer Screening  01/30/2029   Flu Shot  Completed   Hepatitis C Screening  Completed   HIV Screening  Completed   Zoster (Shingles) Vaccine  Completed   HPV Vaccine  Aged Out   Complete foot exam   Discontinued    If you have any questions or concerns, please do not hesitate to call the office at [phone].  I look forward to our next visit and until then take care and stay safe.  Regards,   Dana Allan, MD   [facility]

## 2023-01-01 NOTE — Progress Notes (Signed)
SUBJECTIVE:   Chief Complaint  Patient presents with   Medical Management of Chronic Issues    6 Month follow    Back Pain    Mid to lower back X 1 week   HPI Presents for follow up chronic disease management  Discussed the use of AI scribe software for clinical note transcription with the patient, who gave verbal consent to proceed.  History of Present Illness The patient, with a history of diabetes, presented for a six-month follow-up. They reported good control of their blood glucose levels, with readings ranging from the 90s to 110. However, they had stopped taking Mijera in September due to significant weight loss. Since discontinuing the medication, the patient reported a substantial weight gain, which they attributed to increased consumption of high-calorie foods like chocolate and pretzels. They resumed Mijera at a lower dose two weeks prior to the consultation due to concerns about overeating.  The patient also reported a history of neuropathy, for which they were taking gabapentin. They noted that they only took the medication at night to prevent muscle cramps in the leg, as taking it during the day made them feel too relaxed. They expressed willingness to try a lower dose during the day to manage their back pain.  The patient has been experiencing back pain for at least a week, located in the waist area. They reported having similar pain in the past, which was temporarily relieved by massage. The pain was worse this time and was associated with muscle spasms. They were unsure if the pain was bone or muscle-related. The patient also reported frequent urination, with some episodes of dribbling. They denied any recent trauma, fever, blood in urine, or changes in bowel habits.  The patient also mentioned recent long-distance travel, which involved sitting for extended periods. They reported always sitting on a donut cushion due to chronic coccyx pain. They also noted increased sitting  time due to needlepoint work for Christmas presents. The patient denied any recent changes in their mattress or sleeping arrangements. They also denied any recent lifting or twisting injuries.    PERTINENT PMH / PSH: As above  OBJECTIVE:  BP 112/66   Pulse 60   Temp 97.9 F (36.6 C) (Oral)   Resp 18   Ht 5\' 2"  (1.575 m)   Wt 121 lb 6 oz (55.1 kg)   SpO2 97%   BMI 22.20 kg/m    Physical Exam Vitals reviewed.  Constitutional:      General: She is not in acute distress.    Appearance: Normal appearance. She is normal weight. She is not ill-appearing, toxic-appearing or diaphoretic.  Eyes:     General:        Right eye: No discharge.        Left eye: No discharge.     Conjunctiva/sclera: Conjunctivae normal.  Cardiovascular:     Rate and Rhythm: Normal rate and regular rhythm.     Heart sounds: Normal heart sounds.  Pulmonary:     Effort: Pulmonary effort is normal.     Breath sounds: Normal breath sounds.  Abdominal:     General: Bowel sounds are normal.  Musculoskeletal:        General: Normal range of motion.     Cervical back: Normal.     Thoracic back: Normal.     Lumbar back: Tenderness present. No swelling, edema, spasms or bony tenderness. Normal range of motion. Negative right straight leg raise test and negative left straight leg raise  test.  Skin:    General: Skin is warm and dry.  Neurological:     General: No focal deficit present.     Mental Status: She is alert and oriented to person, place, and time. Mental status is at baseline.  Psychiatric:        Mood and Affect: Mood normal.        Behavior: Behavior normal.        Thought Content: Thought content normal.        Judgment: Judgment normal.        01/01/2023    2:43 PM 10/30/2022   10:52 AM 09/25/2022    4:33 PM 09/23/2022    4:16 PM 07/02/2022    2:49 PM  Depression screen PHQ 2/9  Decreased Interest 2 2 0  2  Down, Depressed, Hopeless 2 2 0  2  PHQ - 2 Score 4 4 0  4  Altered sleeping 2 2 0   2  Tired, decreased energy 2 3 0  1  Change in appetite 1 3 0  0  Feeling bad or failure about yourself  2 1 0  2  Trouble concentrating 2 2 0  1  Moving slowly or fidgety/restless 0 0 0  0  Suicidal thoughts 0 0 0  0  PHQ-9 Score 13 15 0  10  Difficult doing work/chores Very difficult Very difficult Not difficult at all  Very difficult     Information is confidential and restricted. Go to Review Flowsheets to unlock data.      01/01/2023    2:44 PM 10/30/2022   10:53 AM 09/25/2022    4:33 PM 09/23/2022    4:17 PM  GAD 7 : Generalized Anxiety Score  Nervous, Anxious, on Edge 3 3 0   Control/stop worrying 3 3 0   Worry too much - different things 3 3 0   Trouble relaxing 2 2 0   Restless 0 0 0   Easily annoyed or irritable 1 0 0   Afraid - awful might happen 2 2 0   Total GAD 7 Score 14 13 0   Anxiety Difficulty Very difficult Very difficult Not difficult at all      Information is confidential and restricted. Go to Review Flowsheets to unlock data.    ASSESSMENT/PLAN:  Type 2 diabetes mellitus with complications (HCC) Assessment & Plan: A1c of 4.9, which is lower than desired, indicating a risk of hypoglycemia. Patient had stopped taking Tirzepatide due to weight loss but restarted it two weeks ago. -Stop weekly Tirzepatide. -Start Tirzepatide every other week. -Monitor blood glucose levels closely to avoid hypoglycemia. -Refill Tirzepatide 2.5mg , strict precautions provided  Orders: -     Mounjaro; INJECT 2.5 MG SUBCUTANEOUSLY WEEKLY  Dispense: 6 mL; Refill: 1 -     POCT glycosylated hemoglobin (Hb A1C) -     Microalbumin / creatinine urine ratio  Allergic rhinitis, unspecified seasonality, unspecified trigger -     Fexofenadine HCl; Take 1 tablet (180 mg total) by mouth daily as needed for allergies or rhinitis.  Dispense: 90 tablet; Refill: 3  Hyperlipidemia associated with type 2 diabetes mellitus (HCC) -     Rosuvastatin Calcium; Take 1 tablet (20 mg total) by  mouth daily.  Dispense: 90 tablet; Refill: 2  Low vitamin B12 level -     Vitamin B-12; Place 1 tablet (1,000 mcg total) under the tongue daily.  Dispense: 90 tablet; Refill: 3  Primary insomnia Assessment & Plan: Patient  is currently taking Ambien, but has difficulty reducing the dose. -Continue current Ambien regimen. -Consider reducing dose when patient feels ready.  Orders: -     ToxASSURE Select 13 (MW), Urine  Bilateral low back pain, unspecified chronicity, unspecified whether sciatica present Assessment & Plan: New onset of bilateral lower back pain for the past week, worse with bending. No recent trauma. Pain is possibly muscular, but the exact cause is unclear. No red flags.   -Start Gabapentin 100mg  in the morning and continue 300mg  at night for pain control. -Consider adding Baclofen if Gabapentin alone is not effective. -Check urine to rule out urinary tract infection as a potential cause of pain.  Orders: -     POCT URINALYSIS DIP (CLINITEK) -     Gabapentin; Take 1 capsule (100 mg total) by mouth every morning.  Dispense: 30 capsule; Refill: 0  Acute bilateral low back pain without sciatica Assessment & Plan: New onset of bilateral lower back pain for the past week, worse with bending. No recent trauma. Pain is possibly muscular, but the exact cause is unclear. No red flags.   -Start Gabapentin 100mg  in the morning and continue 300mg  at night for pain control. -Consider adding Baclofen if Gabapentin alone is not effective. -Check urine to rule out urinary tract infection as a potential cause of pain.   Urinary frequency Assessment & Plan: Patient reports frequent urge to urinate with variable output. Afebrile, no hematuria or dysuria. -Check urine to rule out urinary tract infection.     PDMP reviewed  Return in about 6 months (around 07/02/2023), or if symptoms worsen or fail to improve, for PCP.  Dana Allan, MD

## 2023-01-02 LAB — MICROALBUMIN / CREATININE URINE RATIO
Creatinine,U: 59.9 mg/dL
Microalb Creat Ratio: 1.2 mg/g (ref 0.0–30.0)
Microalb, Ur: <0.7 mg/dL (ref 0.0–1.9)

## 2023-01-04 ENCOUNTER — Encounter: Payer: Self-pay | Admitting: Family Medicine

## 2023-01-04 DIAGNOSIS — M549 Dorsalgia, unspecified: Secondary | ICD-10-CM | POA: Insufficient documentation

## 2023-01-04 DIAGNOSIS — R350 Frequency of micturition: Secondary | ICD-10-CM | POA: Insufficient documentation

## 2023-01-04 NOTE — Assessment & Plan Note (Signed)
Patient is currently taking Ambien, but has difficulty reducing the dose. -Continue current Ambien regimen. -Consider reducing dose when patient feels ready.

## 2023-01-04 NOTE — Assessment & Plan Note (Signed)
A1c of 4.9, which is lower than desired, indicating a risk of hypoglycemia. Patient had stopped taking Tirzepatide due to weight loss but restarted it two weeks ago. -Stop weekly Tirzepatide. -Start Tirzepatide every other week. -Monitor blood glucose levels closely to avoid hypoglycemia. -Refill Tirzepatide 2.5mg , strict precautions provided

## 2023-01-04 NOTE — Assessment & Plan Note (Signed)
New onset of bilateral lower back pain for the past week, worse with bending. No recent trauma. Pain is possibly muscular, but the exact cause is unclear. No red flags.   -Start Gabapentin 100mg  in the morning and continue 300mg  at night for pain control. -Consider adding Baclofen if Gabapentin alone is not effective. -Check urine to rule out urinary tract infection as a potential cause of pain.

## 2023-01-04 NOTE — Assessment & Plan Note (Signed)
Patient reports frequent urge to urinate with variable output. Afebrile, no hematuria or dysuria. -Check urine to rule out urinary tract infection.

## 2023-01-07 LAB — TOXASSURE SELECT 13 (MW), URINE

## 2023-01-19 ENCOUNTER — Other Ambulatory Visit: Payer: Self-pay | Admitting: Psychiatry

## 2023-01-19 DIAGNOSIS — G4700 Insomnia, unspecified: Secondary | ICD-10-CM

## 2023-01-19 DIAGNOSIS — F411 Generalized anxiety disorder: Secondary | ICD-10-CM

## 2023-01-28 ENCOUNTER — Encounter: Payer: Self-pay | Admitting: Psychiatry

## 2023-01-28 ENCOUNTER — Ambulatory Visit: Payer: BC Managed Care – PPO | Admitting: Psychiatry

## 2023-01-28 VITALS — BP 118/74 | HR 62 | Temp 98.1°F | Ht 62.0 in | Wt 121.0 lb

## 2023-01-28 DIAGNOSIS — F330 Major depressive disorder, recurrent, mild: Secondary | ICD-10-CM | POA: Diagnosis not present

## 2023-01-28 DIAGNOSIS — F411 Generalized anxiety disorder: Secondary | ICD-10-CM | POA: Diagnosis not present

## 2023-01-28 DIAGNOSIS — G4700 Insomnia, unspecified: Secondary | ICD-10-CM

## 2023-01-28 DIAGNOSIS — Z634 Disappearance and death of family member: Secondary | ICD-10-CM

## 2023-01-28 MED ORDER — BUPROPION HCL 75 MG PO TABS: 75.0000 mg | ORAL_TABLET | Freq: Every morning | ORAL | 1 refills | Status: DC

## 2023-01-28 NOTE — Patient Instructions (Signed)
Bupropion Tablets (Depression/Mood Disorders) What is this medication? BUPROPION (byoo PROE pee on) treats depression. It increases norepinephrine and dopamine in the brain, hormones that help regulate mood. It belongs to a group of medications called NDRIs. This medicine may be used for other purposes; ask your health care provider or pharmacist if you have questions. COMMON BRAND NAME(S): Wellbutrin What should I tell my care team before I take this medication? They need to know if you have any of these conditions: An eating disorder, such as anorexia or bulimia Bipolar disorder or psychosis Diabetes or high blood sugar, treated with medication Glaucoma Head injury or brain tumor Heart disease, previous heart attack, or irregular heart beat High blood pressure Kidney disease Liver disease Seizures Suicidal thoughts, plans, or attempt by you or a family member Tourette syndrome Weight loss An unusual or allergic reaction to bupropion, other medications, foods, dyes, or preservatives Pregnant or trying to become pregnant Breastfeeding How should I use this medication? Take this medication by mouth with a glass of water. Follow the directions on the prescription label. You can take it with or without food. If it upsets your stomach, take it with food. Take your medication at regular intervals. Do not take your medication more often than directed. Do not stop taking this medication suddenly except upon the advice of your care team. Stopping this medication too quickly may cause serious side effects or your condition may worsen. A special MedGuide will be given to you by the pharmacist with each prescription and refill. Be sure to read this information carefully each time. Talk to your care team regarding the use of this medication in children. Special care may be needed. Overdosage: If you think you have taken too much of this medicine contact a poison control center or emergency room at  once. NOTE: This medicine is only for you. Do not share this medicine with others. What if I miss a dose? If you miss a dose, take it as soon as you can. If it is less than four hours to your next dose, take only that dose and skip the missed dose. Do not take double or extra doses. What may interact with this medication? Do not take this medication with any of the following: Linezolid MAOIs, such as Azilect, Carbex, Eldepryl, Marplan, Nardil, and Parnate Methylene blue (injected into a vein) Other medications that contain bupropion, such as Zyban This medication may also interact with the following: Alcohol Certain medications for anxiety or sleep Certain medications for blood pressure, such as metoprolol, propranolol Certain medications for HIV or AIDS, such as efavirenz, lopinavir, nelfinavir, ritonavir Certain medications for irregular heartbeat, such as propafenone, flecainide Certain medications for mental health conditions Certain medications for Parkinson disease, such as amantadine, levodopa Certain medications for seizures, such as carbamazepine, phenytoin, phenobarbital Cimetidine Clopidogrel Cyclophosphamide Digoxin Furazolidone Isoniazid Nicotine Orphenadrine Procarbazine Steroid medications, such as prednisone or cortisone Stimulant medications for attention disorders, weight loss, or to stay awake Tamoxifen Theophylline Thiotepa Ticlopidine Tramadol Warfarin This list may not describe all possible interactions. Give your health care provider a list of all the medicines, herbs, non-prescription drugs, or dietary supplements you use. Also tell them if you smoke, drink alcohol, or use illegal drugs. Some items may interact with your medicine. What should I watch for while using this medication? Tell your care team if your symptoms do not get better or if they get worse. Visit your care team for regular checks on your progress. Because it may take several  weeks to see  the full effects of this medication, it is important to continue your treatment as prescribed. This medication may cause thoughts of suicide or depression. This includes sudden changes in mood, behaviors, or thoughts. These changes can happen at any time but are more common in the beginning of treatment or after a change in dose. Call your care team right away if you experience these thoughts or worsening depression. This medication may cause mood and behavior changes, such as anxiety, nervousness, irritability, hostility, restlessness, excitability, hyperactivity, or trouble sleeping. These changes can happen at any time but are more common in the beginning of treatment or after a change in dose. Call your care team right away if you notice any of these symptoms. This medication may cause serious skin reactions. They can happen weeks to months after starting the medication. Contact your care team right away if you notice fevers or flu-like symptoms with a rash. The rash may be red or purple and then turn into blisters or peeling of the skin. You may also notice a red rash with swelling of the face, lips, or lymph nodes in your neck or under your arms. Avoid drinks that contain alcohol while taking this medication. Drinking large amounts of alcohol, using sleeping or anxiety medications, or quickly stopping the use of these agents while taking this medication may increase your risk for a seizure. This medication may affect your coordination, reaction time, or judgment. Do not drive or operate machinery until you know how this medication affects you. Do not take this medication close to bedtime. It may prevent you from sleeping. Your mouth may get dry. Chewing sugarless gum or sucking hard candy and drinking plenty of water may help. Contact your care team if the problem does not go away or is severe. What side effects may I notice from receiving this medication? Side effects that you should report to your  care team as soon as possible: Allergic reactions--skin rash, itching, hives, swelling of the face, lips, tongue, or throat Increase in blood pressure Mood and behavior changes--anxiety, nervousness, confusion, hallucinations, irritability, hostility, thoughts of suicide or self-harm, worsening mood, feelings of depression Redness, blistering, peeling, or loosening of the skin, including inside the mouth Seizures Sudden eye pain or change in vision such as blurry vision, seeing halos around lights, vision loss Side effects that usually do not require medical attention (report to your care team if they continue or are bothersome): Constipation Dizziness Dry mouth Loss of appetite Nausea Tremors or shaking Trouble sleeping This list may not describe all possible side effects. Call your doctor for medical advice about side effects. You may report side effects to FDA at 1-800-FDA-1088. Where should I keep my medication? Keep out of the reach of children and pets. Store at room temperature between 20 and 25 degrees C (68 and 77 degrees F), away from direct sunlight and moisture. Keep tightly closed. Throw away any unused medication after the expiration date. NOTE: This sheet is a summary. It may not cover all possible information. If you have questions about this medicine, talk to your doctor, pharmacist, or health care provider.  2024 [employer]/Gold Standard (2021-09-29 00:00:00)

## 2023-01-28 NOTE — Progress Notes (Signed)
 [facility] MD OP Progress Note  01/28/2023 2:27 PM Kristin [name]  MRN:  [id number]  Chief Complaint:  Chief Complaint  Patient presents with   Follow-up   Anxiety   Depression   Medication Refill   HPI: Kristin [name] is a 63 year old Caucasian female, married, retired, lives in [place], has a history of GAD, MDD, insomnia, bereavement, diabetes mellitus, polyneuropathy, tendinopathy of right biceps status post right shoulder arthroscopy, chronic migraine, was evaluated in office today.  The patient, with a history of anxiety, depression, and sensory processing disorder, presents with worsening symptoms of depression and anxiety following the recent loss of her mother. The patient reports struggling significantly during the holiday season, experiencing days where she was unable to get out of bed and felt overwhelmed. She also reports a decrease in her usual sleep duration from 13-15 hours to approximately 8 hours, which she attributes to her medication regimen.  The patient reports appetite is fair, continues to have one meal a day and a protein shake.she has been doing so since the past 1 year or so.  Despite this, she reports a weight gain since the last visit.  Previously she had lost a lot of weight and currently is gaining it back and she is happy about that.  The patient also reports increased back pain, suspected to be due to arthritis. She has been taking gabapentin  at night and in the afternoon to manage this pain.  Patient continues to struggle with attention and focus problems likely due to her anxiety and depression and grief symptoms.  She was previously tested for ADHD by [facility] however no formal diagnosis of ADHD was provided.  Patient was asked to return after managing her anxiety for reevaluation.  The patient has been seeing a therapist, but missed her last appointment due to her depressive symptoms. She reports some improvement in her depressive  symptoms, now managing to get out of bed, get dressed, and perform some daily activities such as cooking and needlework.  The patient has a history of taking several medications including Celexa , Ambien , and hydroxyzine . She reports no new allergies or adverse reactions to her current medications.   Patient currently denies any suicidality, homicidality or perceptual disturbances.   Visit Diagnosis:    ICD-10-CM   1. GAD (generalized anxiety disorder)  F41.1     2. MDD (major depressive disorder), recurrent episode, mild (HCC)  F33.0 buPROPion  (WELLBUTRIN ) 75 MG tablet    3. Insomnia, unspecified type  G47.00     4. Bereavement  Z63.4       Past Psychiatric History: I have reviewed past psychiatric history from progress note on 04/03/2022.  Past trials of medications like Prozac , trazodone , nortriptyline , amitriptyline  for headaches, neuropathy.  Patient had ADHD testing completed at [facility]-no formal diagnosis of ADHD at this time.  Patient may need a reevaluation once anxiety symptoms are controlled.  Past Medical History:  Past Medical History:  Diagnosis Date   ADHD    Adhesive capsulitis of right shoulder 07/23/2020   Allergy     Annual physical exam 08/19/2019   Anxiety 12/12/2015   Arthritis    Basal cell carcinoma    nose, left cheek, right upper lip, right posterior shoulder most recent 6 years ago on left cheek   Basal cell carcinoma 03/26/2021   L chest, [facility] 04/24/2021   Benign essential hypertension 08/14/2014   Bleeding    Bursitis of right shoulder 12/25/2020   Chicken pox  Chronic diarrhea    Chronic intractable headache 10/20/2017   Chronic migraine w/o aura, not intractable, w/o stat migr 08/08/2020   Complication of anesthesia    blood pressure elevated after gastric sleeve surgery in recovery, pt does not want mask over face; migraines   COVID-19    03/01/20 had MAB 03/03/20 and felt better next day 03/04/20   CTS (carpal tunnel  syndrome) 04/05/2020   Dr. Maree s/p [facility]/Lidocaine  injection 12/05/19   Depression    Diabetes mellitus without complication (HCC)    pre-diabetic currently, was diabetic prior to gastric sleeve surgery    Diabetic polyneuropathy associated with diabetes mellitus due to underlying condition (HCC) 08/08/2020   Disorder of right rotator cuff    10/03/19 MRI right shoulder abnormal Dr. Lorriane Blanch   Fall    08/2020   Fall    03/28/21   Fatty liver    Fatty liver 05/06/2017   US  + 2013    Gout    Hematuria 10/15/2018   History of shingles 06/19/2021   lower back and R side of thigh   Hives 03/28/2020   Hyperlipidemia    Hypertension    no problems since losing weight after gastric sleeve surgery   Hypertension 07/17/2014   IBS (irritable bowel syndrome)    Injury of right wrist 06/26/2016   Insomnia    Migraine    Neuropathy    bilateral feet   Obesity    Obesity (BMI 30-39.9) 05/06/2017   Obesity, diabetes, and hypertension syndrome (HCC) 08/18/2019   Osteopenia    Palpitations 08/14/2014   PONV (postoperative nausea and vomiting)    Squamous cell carcinoma of skin 03/26/2021   SCC IS R mid sternum, [facility] 04/24/2021   Tendonitis of shoulder, right 12/25/2020    Past Surgical History:  Procedure Laterality Date   ABDOMINAL HYSTERECTOMY     no h/o abnormal pap still has 1 ovary left   ANKLE ARTHROSCOPY Left 05/29/2016   Procedure: ANKLE ARTHROSCOPY;  Surgeon: Janit Thresa HERO, DPM;  Location: [facility];  Service: Podiatry;  Laterality: Left;   APPENDECTOMY     basal cell removed     CESAREAN SECTION N/A    1985 & 1995   CHOLECYSTECTOMY     CLOSED MANIPULATION SHOULDER WITH STERIOD INJECTION Right 03/31/2022   Procedure: MANIPULATION UNDER ANESTHESIA WITH GLENOHUMERAL AND SUBACROMIAL CORTICOSTERIOD INJECTION;  Surgeon: Blanch Priest, MD;  Location: [facility] ORS;  Service: Orthopedics;  Laterality: Right;   COLONOSCOPY WITH PROPOFOL  N/A 01/31/2019   Procedure: COLONOSCOPY WITH PROPOFOL ;   Surgeon: Unk Corinn Skiff, MD;  Location: [facility] ENDOSCOPY;  Service: Gastroenterology;  Laterality: N/A;   cyst removed from wrist     ESOPHAGOGASTRODUODENOSCOPY (EGD) WITH PROPOFOL  N/A 01/31/2019   Procedure: ESOPHAGOGASTRODUODENOSCOPY (EGD) WITH PROPOFOL ;  Surgeon: Unk Corinn Skiff, MD;  Location: [facility] ENDOSCOPY;  Service: Gastroenterology;  Laterality: N/A;   ESOPHAGOGASTRODUODENOSCOPY (EGD) WITH PROPOFOL  N/A 05/20/2022   Procedure: ESOPHAGOGASTRODUODENOSCOPY (EGD) WITH PROPOFOL ;  Surgeon: Unk Corinn Skiff, MD;  Location: [facility] ENDOSCOPY;  Service: Gastroenterology;  Laterality: N/A;   ESOPHAGOGASTRODUODENOSCOPY (EGD) WITH PROPOFOL  N/A 09/24/2022   Procedure: ESOPHAGOGASTRODUODENOSCOPY (EGD) WITH PROPOFOL ;  Surgeon: Unk Corinn Skiff, MD;  Location: [facility] ENDOSCOPY;  Service: Gastroenterology;  Laterality: N/A;   FLEXIBLE SIGMOIDOSCOPY N/A 05/21/2020   Procedure: FLEXIBLE SIGMOIDOSCOPY;  Surgeon: Unk Corinn Skiff, MD;  Location: [facility] ENDOSCOPY;  Service: Gastroenterology;  Laterality: N/A;   FOOT SURGERY     left foot 11/2016 Dr. Rainell Janit    left elbow surgery  07/2017 Dr Helayne Glenn epicondylitis    LYSIS OF ADHESION Right 08/27/2021   Procedure: Right shoulder arthroscopic lysis of adhesions, capsular release, and manipulation under anesthesia with corticosteroid injection and biceps tenodesis;  Surgeon: Tobie Priest, MD;  Location: [facility];  Service: Orthopedics;  Laterality: Right;   MOHS SURGERY     x 2 face nose and left cheek    OSTEOCHONDROMA EXCISION Left 05/29/2016   Procedure: OSTEOCHONDRAL DRILLING TALUS;  Surgeon: Janit Thresa HERO, DPM;  Location: [facility];  Service: Podiatry;  Laterality: Left;   PLANTAR FASCIA RELEASE Left 05/29/2016   Procedure: ENDOSCOPIC PLANTAR FASCIOTOMY;  Surgeon: Janit Thresa HERO, DPM;  Location: [facility];  Service: Podiatry;  Laterality: Left;   ROTATOR CUFF REPAIR Left    SHOULDER ARTHROSCOPY WITH BICEPSTENOTOMY Right 03/31/2022    Procedure: SHOULDER ARTHROSCOPY WITH BICEPS TENOTOMY, ARTHROSCOPIC LYSIS OF ADHESIONS, AND CAPSULAR RELEASE, AND EXTENSIVE GLENOHUMERAL DEBRIDEMENT;  Surgeon: Tobie Priest, MD;  Location: [facility] OR[facility];  Service: Orthopedics;  Laterality: Right;   SHOULDER ARTHROSCOPY WITH ROTATOR CUFF REPAIR  03/31/2022   Procedure: SHOULDER ARTHROSCOPY WITH ROTATOR CUFF REPAIR;  Surgeon: Tobie Priest, MD;  Location: [facility] ORS;  Service: Orthopedics;;   STOMACH SURGERY  2017   gastric sleeve surgery   TENNIS ELBOW RELEASE/NIRSCHEL PROCEDURE Left 08/05/2017   Procedure: TENNIS ELBOW RELEASE/NIRSCHEL PROCEDURE;  Surgeon: Glenn Helayne, MD;  Location: [facility] ORS;  Service: Orthopedics;  Laterality: Left;   TUBAL LIGATION     wedge resection of ovary  1982    Family Psychiatric History: I have reviewed family psychiatric history from progress note on 04/03/2022.  Family History:  Family History  Problem Relation Age of Onset   Heart disease Mother    Breast cancer Mother 51       metastatic, now hip, spine, and leg    Arthritis Mother    Hyperlipidemia Mother    Cancer Mother        spinal surgery. breast dx'ed age 26 now metastatic 66 as of 07/13/18   Cystic fibrosis Mother        carrier    Heart disease Father    Lung cancer Father        smoker   Hyperlipidemia Father    Hypertension Father    Depression Sister    Diabetes Sister    Stroke Sister    Other Sister        covid +   Diabetes Sister    Diabetes Maternal Aunt    Alcohol abuse Maternal Aunt    Diabetes Maternal Aunt    Alcohol abuse Maternal Uncle    Diabetes Maternal Uncle    Alcohol abuse Paternal Uncle    Diabetes Maternal Grandfather    Hyperlipidemia Maternal Grandfather    Arthritis Maternal Grandmother    Sudden death Cousin    Schizophrenia Cousin    Cystic fibrosis Other        great niece    Factor V Leiden deficiency Other        uncle and cousins and m GF (all maternal)   Alcohol abuse Niece    Alcohol abuse Nephew     ADD / ADHD Son    Depression Son    ADD / ADHD Son    Post-traumatic stress disorder Son    Colon cancer Neg Hx    Ovarian cancer Neg Hx     Social History: I have reviewed social history from progress note on 04/03/2022. Social History   Socioeconomic  History   Marital status: Married    Spouse name: Not on file   Number of children: 3   Years of education: Not on file   Highest education level: Some college, no degree  Occupational History   Not on file  Tobacco Use   Smoking status: Never   Smokeless tobacco: Never  Vaping Use   Vaping status: Never Used  Substance and Sexual Activity   Alcohol use: Never   Drug use: Never   Sexual activity: Yes    Partners: Male    Birth control/protection: Surgical  Other Topics Concern   Not on file  Social History Narrative   Married x 37 years as of 10/2017    Kids 3 boys youngest is Deward   Write childrens books    Retired from [employer] 01/2017    Enjoys traveling    Right handed   Caffeine: quit 2009   Social Drivers of [employer] Strain: Low Risk  (01/01/2023)   Overall Financial Resource Strain (CARDIA)    Difficulty of Paying Living Expenses: Not very hard  Food Insecurity: No Food Insecurity (01/01/2023)   Hunger Vital Sign    Worried About Running Out of Food in the Last Year: Never true    Ran Out of Food in the Last Year: Never true  Transportation Needs: No Transportation Needs (01/01/2023)   PRAPARE - Administrator, Civil Service (Medical): No    Lack of Transportation (Non-Medical): No  Physical Activity: Inactive (01/01/2023)   Exercise Vital Sign    Days of Exercise per Week: 0 days    Minutes of Exercise per Session: 20 min  Stress: Stress Concern Present (01/01/2023)   [employer] of Occupational Health - Occupational Stress Questionnaire    Feeling of Stress : Very much  Social Connections: Moderately Isolated (01/01/2023)   Social Connection and Isolation Panel  [NHANES]    Frequency of Communication with Friends and Family: Three times a week    Frequency of Social Gatherings with Friends and Family: Patient declined    Attends Religious Services: Never    Database Administrator or Organizations: No    Attends Engineer, Structural: Not on file    Marital Status: Married    Allergies:  Allergies  Allergen Reactions   Tetracyclines & Related Swelling and Rash    FACE SWELL, RASH ON CHEST   Amoxicillin     Alcohol Rash    ABDOMINAL CRAMPS, drinking alcohol   Azithromycin  Rash   Metoprolol  Diarrhea    Metabolic Disorder Labs: Lab Results  Component Value Date   HGBA1C 4.9 01/01/2023   No results found for: PROLACTIN Lab Results  Component Value Date   CHOL 151 07/03/2022   TRIG 70.0 07/03/2022   HDL 59.20 07/03/2022   CHOLHDL 3 07/03/2022   VLDL 14.0 07/03/2022   LDLCALC 77 07/03/2022   LDLCALC 53 01/09/2022   Lab Results  Component Value Date   TSH 1.62 10/30/2022   TSH 2.524 08/18/2022    Therapeutic Level Labs: No results found for: LITHIUM No results found for: VALPROATE No results found for: CBMZ  Current Medications: Current Outpatient Medications  Medication Sig Dispense Refill   acetaminophen  (TYLENOL ) 500 MG tablet Take 2 tablets (1,000 mg total) by mouth every 8 (eight) hours. 90 tablet 2   buPROPion  (WELLBUTRIN ) 75 MG tablet Take 1 tablet (75 mg total) by mouth in the morning. 30 tablet 1   Calcium  Citrate-Vitamin D   500-10 MG-MCG CHEW Take 1 tablet two times a day     celecoxib (CELEBREX) 200 MG capsule Take by mouth.     citalopram  (CELEXA ) 40 MG tablet Take 1 tablet (40 mg total) by mouth daily. 90 tablet 1   clonazePAM  (KLONOPIN ) 0.5 MG tablet Take 0.5-1 tablets (0.25-0.5 mg total) by mouth daily as needed for anxiety. Limited supply 10 tablet 0   Cyanocobalamin  (VITAMIN B-12) 1000 MCG SUBL Place 1 tablet (1,000 mcg total) under the tongue daily. 90 tablet 3   fexofenadine  (ALLEGRA ) 180 MG  tablet Take 1 tablet (180 mg total) by mouth daily as needed for allergies or rhinitis. 90 tablet 3   gabapentin  (NEURONTIN ) 300 MG capsule TAKE 1 CAPSULE BY MOUTH 3 TIMES DAILY AS NEEDED. (Patient taking differently: Take 300 mg by mouth 2 (two) times daily. TAKE 1 CAPSULE BY MOUTH 2 TIMES DAILY AS NEEDED.) 90 capsule 5   hydrOXYzine  (ATARAX ) 25 MG tablet TAKE 0.5-1 TABLETS (12.5-25 MG TOTAL) BY MOUTH 3 (THREE) TIMES DAILY AS NEEDED FOR ANXIETY (AND SLEEP). 270 tablet 1   montelukast  (SINGULAIR ) 10 MG tablet Take 1 tablet (10 mg total) by mouth at bedtime. 90 tablet 3   omeprazole  (PRILOSEC) 40 MG capsule Take 1 capsule (40 mg total) by mouth daily. 90 capsule 3   ondansetron  (ZOFRAN -ODT) 4 MG disintegrating tablet Take 1 tablet (4 mg total) by mouth every 8 (eight) hours as needed. for nausea 20 tablet 2   Rimegepant Sulfate (NURTEC) 75 MG TBDP Take 1 tablet (75 mg total) by mouth daily as needed (take for abortive therapy of migraine, no more than 1 tablet in 24 hours or 10 per month). 10 tablet 2   rosuvastatin  (CRESTOR ) 20 MG tablet Take 1 tablet (20 mg total) by mouth daily. 90 tablet 2   tirzepatide  (MOUNJARO ) 2.5 MG/0.5ML Pen INJECT 2.5 MG SUBCUTANEOUSLY WEEKLY 6 mL 1   topiramate  (TOPAMAX ) 50 MG tablet Take 1 tablet (50 mg total) by mouth daily. 90 tablet 3   zolpidem  (AMBIEN  CR) 6.25 MG CR tablet Take 2 tablets (12.5 mg total) by mouth at bedtime as needed for sleep. 120 tablet 3   gabapentin  (NEURONTIN ) 100 MG capsule Take 1 capsule (100 mg total) by mouth every morning. (Patient not taking: Reported on 01/28/2023) 30 capsule 0   No current facility-administered medications for this visit.     Musculoskeletal: Strength & Muscle Tone: within normal limits Gait & Station: normal Patient leans: N/A  Psychiatric Specialty Exam: Review of Systems  Psychiatric/Behavioral:  Positive for decreased concentration and dysphoric mood. The patient is nervous/anxious.        Grieving    Blood  pressure 118/74, pulse 62, temperature 98.1 F (36.7 C), temperature source Temporal, height 5' 2 (1.575 m), weight 121 lb (54.9 kg), SpO2 99%.Body mass index is 22.13 kg/m.  General Appearance: Fairly Groomed  Eye Contact:  Fair  Speech:  Clear and Coherent  Volume:  Normal  Mood:   grieving, sadness, anxiety  Affect:  Congruent  Thought Process:  Goal Directed and Descriptions of Associations: Intact  Orientation:  Full (Time, Place, and Person)  Thought Content: Logical   Suicidal Thoughts:  No  Homicidal Thoughts:  No  Memory:  Immediate;   Fair Recent;   Fair Remote;   Fair  Judgement:  Fair  Insight:  Fair  Psychomotor Activity:  Normal  Concentration:  Concentration: Fair and Attention Span: Fair  Recall:  [employer] of Knowledge: Fair  Language:  Fair  Akathisia:  No  Handed:  Right  AIMS (if indicated): not done  Assets:  Desire for Improvement Housing Social Support  ADL's:  Intact  Cognition: WNL  Sleep:  Fair   Screenings: GAD-7    Garment/textile Technologist Visit from 01/28/2023 in [facility] Office Visit from 01/01/2023 in [facility] [employer] Visit from 10/30/2022 in [facility] [employer] [facility] Video Visit from 09/25/2022 in [facility] [employer] Visit from 09/23/2022 in [facility]  Total GAD-7 Score 16 14 13  0 16      PHQ2-9    Flowsheet Row Office Visit from 01/28/2023 in [facility] Office Visit from 01/01/2023 in [facility] Visit from 10/30/2022 in [facility] [employer] Video Visit from 09/25/2022 in [facility] [employer] Visit from 09/23/2022 in [facility]  PHQ-2 Total Score 4 4 4  0 4   PHQ-9 Total Score 16 13 15  0 19      Flowsheet Row Office Visit from 01/28/2023 in [facility] Video Visit from 10/28/2022 in [facility] Admission (Discharged) from 09/24/2022 in [facility]  C-SSRS RISK CATEGORY No Risk No Risk No Risk        Assessment and Plan: Kristin [name] is a 63 year old Caucasian female, married, retired, lives in [place], has a history of anxiety, depression, multiple medical problems including chronic pain, bereavement was evaluated in office today.  Patient currently struggles with anxiety, concentration problem and grief, discussed assessment and plan as noted below.  Major Depressive Disorder-unstable Significant depressive symptoms exacerbated by recent bereavement. Symptoms include difficulty getting out of bed, crying, and lack of motivation. On Celexa  40 mg and Ambien . Missed a therapy appointment but now improving slightly. Discussed adding Wellbutrin  to address residual depressive symptoms and improve energy and concentration. Informed about potential side effects, including increased anxiety and sleep disturbances. Expressed interest in improving concentration and energy. - Start Wellbutrin  75 mg daily in the morning - Continue Celexa  40 mg daily - Continue Ambien  extended release 12.5 mg at bedtime as prescribed - Continue therapy with [name] Hummer - Follow up in 4 weeks via video  Generalized Anxiety Disorder-unstable Anxiety exacerbated by recent life events. Currently taking hydroxyzine  as needed. Discussed potential for Wellbutrin  to worsen anxiety but decided to proceed with a trial given interest in improving concentration and energy. Informed about potential for increased anxiety and need to monitor symptoms closely. - Monitor for any increase in anxiety symptoms with the addition of Wellbutrin  - Continue hydroxyzine  25 mg twice  a day as needed - Celexa  40 mg daily. - Continue CBT  Insomnia-improving Difficulty sleeping without Ambien . Currently taking Ambien  and reports sleeping approximately 8 hours per night. Discussed importance of maintaining a consistent sleep schedule and potential risks of long-term Ambien  use. - Continue Ambien  as prescribed  Bereavement-improving Patient is currently coping better with grief. - Continue CBT with Ms. [name] Hummer. - Continue clonazepam  0.25-0.5 mg daily as needed as needed-limited supply provided previously however patient has not been using it much.     Follow-up - Schedule follow-up appointment in 4 weeks via video   Collaboration of Care: Collaboration of Care: Referral or follow-up with counselor/therapist AEB encouraged to continue CBT  with Ms. [name] Hummer.  Coordinated care with Ms. Hummer.  Patient/Guardian was advised Release of Information must be obtained prior to any record release in order to collaborate their care with an outside provider. Patient/Guardian was advised if they have not already done so to contact the registration department to sign all necessary forms in order for us  to release information regarding their care.   Consent: Patient/Guardian gives verbal consent for treatment and assignment of benefits for services provided during this visit. Patient/Guardian expressed understanding and agreed to proceed.  This note was generated in part or whole with voice recognition software. Voice recognition is usually quite accurate but there are transcription errors that can and very often do occur. I apologize for any typographical errors that were not detected and corrected.     Dariana Garbett, MD 01/30/2023, 7:27 AM

## 2023-01-30 DIAGNOSIS — F901 Attention-deficit hyperactivity disorder, predominantly hyperactive type: Secondary | ICD-10-CM | POA: Diagnosis not present

## 2023-01-30 DIAGNOSIS — F331 Major depressive disorder, recurrent, moderate: Secondary | ICD-10-CM | POA: Diagnosis not present

## 2023-01-30 DIAGNOSIS — F411 Generalized anxiety disorder: Secondary | ICD-10-CM | POA: Diagnosis not present

## 2023-02-09 ENCOUNTER — Telehealth: Payer: Self-pay | Admitting: Gastroenterology

## 2023-02-09 NOTE — Telephone Encounter (Signed)
I still recommend her to be evaluated at [facility], it is hard to get an appointment with them and she will also have an idea how to manage her condition  RV

## 2023-02-09 NOTE — Telephone Encounter (Signed)
The patient called in wanting to speak to the nurse. She said if the nurse is not available she want to leave her a voicemail.

## 2023-02-09 NOTE — Telephone Encounter (Addendum)
Patient states she is not having no issues at this time with swallowing and wants to know if she still needs to see the [facility] on 02/13/2023

## 2023-02-09 NOTE — Telephone Encounter (Signed)
Patient verbalized understanding and will keep the appointment

## 2023-02-10 DIAGNOSIS — F901 Attention-deficit hyperactivity disorder, predominantly hyperactive type: Secondary | ICD-10-CM | POA: Diagnosis not present

## 2023-02-10 DIAGNOSIS — F411 Generalized anxiety disorder: Secondary | ICD-10-CM | POA: Diagnosis not present

## 2023-02-10 DIAGNOSIS — F331 Major depressive disorder, recurrent, moderate: Secondary | ICD-10-CM | POA: Diagnosis not present

## 2023-02-11 ENCOUNTER — Ambulatory Visit: Payer: BC Managed Care – PPO | Admitting: Family Medicine

## 2023-02-13 DIAGNOSIS — R12 Heartburn: Secondary | ICD-10-CM | POA: Diagnosis not present

## 2023-02-13 DIAGNOSIS — R1310 Dysphagia, unspecified: Secondary | ICD-10-CM | POA: Diagnosis not present

## 2023-02-19 ENCOUNTER — Other Ambulatory Visit: Payer: Self-pay | Admitting: Psychiatry

## 2023-02-19 DIAGNOSIS — F330 Major depressive disorder, recurrent, mild: Secondary | ICD-10-CM

## 2023-02-24 DIAGNOSIS — F901 Attention-deficit hyperactivity disorder, predominantly hyperactive type: Secondary | ICD-10-CM | POA: Diagnosis not present

## 2023-02-24 DIAGNOSIS — F411 Generalized anxiety disorder: Secondary | ICD-10-CM | POA: Diagnosis not present

## 2023-02-24 DIAGNOSIS — F331 Major depressive disorder, recurrent, moderate: Secondary | ICD-10-CM | POA: Diagnosis not present

## 2023-02-27 ENCOUNTER — Telehealth (INDEPENDENT_AMBULATORY_CARE_PROVIDER_SITE_OTHER): Payer: Self-pay | Admitting: Psychiatry

## 2023-02-27 ENCOUNTER — Encounter: Payer: Self-pay | Admitting: Psychiatry

## 2023-02-27 DIAGNOSIS — F330 Major depressive disorder, recurrent, mild: Secondary | ICD-10-CM | POA: Diagnosis not present

## 2023-02-27 DIAGNOSIS — G4700 Insomnia, unspecified: Secondary | ICD-10-CM

## 2023-02-27 DIAGNOSIS — F411 Generalized anxiety disorder: Secondary | ICD-10-CM | POA: Diagnosis not present

## 2023-02-27 DIAGNOSIS — Z634 Disappearance and death of family member: Secondary | ICD-10-CM

## 2023-02-27 NOTE — Progress Notes (Signed)
 Virtual Visit via Video Note  I connected with Kristin [name] on 02/27/23 at 10:30 AM EST by a video enabled telemedicine application and verified that I am speaking with the correct person using two identifiers.  Location Provider Location : [facility] Patient Location : Home  Participants: Patient , Provider    I discussed the limitations of evaluation and management by telemedicine and the availability of in person appointments. The patient expressed understanding and agreed to proceed      I discussed the assessment and treatment plan with the patient. The patient was provided an opportunity to ask questions and all were answered. The patient agreed with the plan and demonstrated an understanding of the instructions.   The patient was advised to call back or seek an in-person evaluation if the symptoms worsen or if the condition fails to improve as anticipated.   [facility] MD OP Progress Note  02/27/2023 12:19 PM Kristin [name]  MRN:  [id number]  Chief Complaint:  Chief Complaint  Patient presents with   Follow-up   Anxiety   Depression   Medication Refill   HPI: Kristin [name] is a 63 year old Caucasian female, married, retired, lives in [place], has a history of GAD, MDD, insomnia, bereavement, diabetes mellitus, polyneuropathy, tendinopathy of right biceps status post right shoulder arthroplasty, chronic migraine was evaluated by telemedicine today.  The patient presents with depression and anxiety management.   She is currently taking Wellbutrin  75 mg and Celexa  for depression and anxiety. Wellbutrin  has improved her energy levels but has not significantly enhanced her concentration. She has been on this regimen for about four weeks. No side effects from her medications, thoughts of self-harm, or hallucinations. She has not needed to use clonazepam  frequently.  She describes a recent incident where she managed her anxiety during a fire alarm at a doctor's office,  which she considers a personal achievement. She was able to evacuate the building calmly, despite feeling anxious internally. She is seeing her therapist, [name], every two to three weeks, working on core beliefs and perfectionism, which affect her motivation and daily functioning.  She notes a tendency to feel overwhelmed in the mornings, leading to difficulty getting out of bed. This is a newer development, as she previously felt more obligated to complete tasks when her children were dependent on her. She reports sleeping six to eight hours per night, though she sometimes takes naps if she feels depressed or overwhelmed.  She is coping better with grief, noting improved communication and shared memories with her sisters. She is experiencing less intense emotional responses, such as sobbing, and more positive interactions.  Visit Diagnosis:    ICD-10-CM   1. GAD (generalized anxiety disorder)  F41.1     2. MDD (major depressive disorder), recurrent episode, mild (HCC)  F33.0     3. Insomnia, unspecified type  G47.00     4. Bereavement  Z63.4       Past Psychiatric History: I have reviewed past psychiatric history from progress note on 04/03/2022.  Past trials of medications like Prozac , trazodone , nortriptyline , amitriptyline  for headaches, neuropathy.  Patient had ADHD testing completed at [facility]-no formal diagnosis of ADHD.  Patient may need a reevaluation once anxiety symptoms are more controlled.  Past Medical History:  Past Medical History:  Diagnosis Date   ADHD    Adhesive capsulitis of right shoulder 07/23/2020   Allergy     Annual physical exam 08/19/2019   Anxiety 12/12/2015   Arthritis  Basal cell carcinoma    nose, left cheek, right upper lip, right posterior shoulder most recent 6 years ago on left cheek   Basal cell carcinoma 03/26/2021   L chest, EDC 04/24/2021   Benign essential hypertension 08/14/2014   Bleeding    Bursitis of right shoulder  12/25/2020   Chicken pox    Chronic diarrhea    Chronic intractable headache 10/20/2017   Chronic migraine w/o aura, not intractable, w/o stat migr 08/08/2020   Complication of anesthesia    blood pressure elevated after gastric sleeve surgery in recovery, pt does not want mask over face; migraines   COVID-19    03/01/20 had MAB 03/03/20 and felt better next day 03/04/20   CTS (carpal tunnel syndrome) 04/05/2020   Dr. Maree s/p [facility]/Lidocaine  injection 12/05/19   Depression    Diabetes mellitus without complication (HCC)    pre-diabetic currently, was diabetic prior to gastric sleeve surgery    Diabetic polyneuropathy associated with diabetes mellitus due to underlying condition (HCC) 08/08/2020   Disorder of right rotator cuff    10/03/19 MRI right shoulder abnormal Dr. Lorriane Blanch   Fall    08/2020   Fall    03/28/21   Fatty liver    Fatty liver 05/06/2017   US  + 2013    Gout    Hematuria 10/15/2018   History of shingles 06/19/2021   lower back and R side of thigh   Hives 03/28/2020   Hyperlipidemia    Hypertension    no problems since losing weight after gastric sleeve surgery   Hypertension 07/17/2014   IBS (irritable bowel syndrome)    Injury of right wrist 06/26/2016   Insomnia    Migraine    Neuropathy    bilateral feet   Obesity    Obesity (BMI 30-39.9) 05/06/2017   Obesity, diabetes, and hypertension syndrome (HCC) 08/18/2019   Osteopenia    Palpitations 08/14/2014   PONV (postoperative nausea and vomiting)    Squamous cell carcinoma of skin 03/26/2021   SCC IS R mid sternum, EDC 04/24/2021   Tendonitis of shoulder, right 12/25/2020    Past Surgical History:  Procedure Laterality Date   ABDOMINAL HYSTERECTOMY     no h/o abnormal pap still has 1 ovary left   ANKLE ARTHROSCOPY Left 05/29/2016   Procedure: ANKLE ARTHROSCOPY;  Surgeon: Janit Thresa HERO, DPM;  Location: [facility];  Service: Podiatry;  Laterality: Left;   APPENDECTOMY     basal cell removed     CESAREAN  SECTION N/A    1985 & 1995   CHOLECYSTECTOMY     CLOSED MANIPULATION SHOULDER WITH STERIOD INJECTION Right 03/31/2022   Procedure: MANIPULATION UNDER ANESTHESIA WITH GLENOHUMERAL AND SUBACROMIAL CORTICOSTERIOD INJECTION;  Surgeon: Blanch Priest, MD;  Location: [facility] ORS;  Service: Orthopedics;  Laterality: Right;   COLONOSCOPY WITH PROPOFOL  N/A 01/31/2019   Procedure: COLONOSCOPY WITH PROPOFOL ;  Surgeon: Unk Corinn Skiff, MD;  Location: [facility] ENDOSCOPY;  Service: Gastroenterology;  Laterality: N/A;   cyst removed from wrist     ESOPHAGOGASTRODUODENOSCOPY (EGD) WITH PROPOFOL  N/A 01/31/2019   Procedure: ESOPHAGOGASTRODUODENOSCOPY (EGD) WITH PROPOFOL ;  Surgeon: Unk Corinn Skiff, MD;  Location: [facility] ENDOSCOPY;  Service: Gastroenterology;  Laterality: N/A;   ESOPHAGOGASTRODUODENOSCOPY (EGD) WITH PROPOFOL  N/A 05/20/2022   Procedure: ESOPHAGOGASTRODUODENOSCOPY (EGD) WITH PROPOFOL ;  Surgeon: Unk Corinn Skiff, MD;  Location: [facility] ENDOSCOPY;  Service: Gastroenterology;  Laterality: N/A;   ESOPHAGOGASTRODUODENOSCOPY (EGD) WITH PROPOFOL  N/A 09/24/2022   Procedure: ESOPHAGOGASTRODUODENOSCOPY (EGD) WITH PROPOFOL ;  Surgeon: Unk Corinn  Jess, MD;  Location: [facility] ENDOSCOPY;  Service: Gastroenterology;  Laterality: N/A;   FLEXIBLE SIGMOIDOSCOPY N/A 05/21/2020   Procedure: FLEXIBLE SIGMOIDOSCOPY;  Surgeon: Unk Corinn Jess, MD;  Location: [facility] ENDOSCOPY;  Service: Gastroenterology;  Laterality: N/A;   FOOT SURGERY     left foot 11/2016 Dr. Rainell Sar    left elbow surgery     07/2017 Dr Helayne Glenn epicondylitis    LYSIS OF ADHESION Right 08/27/2021   Procedure: Right shoulder arthroscopic lysis of adhesions, capsular release, and manipulation under anesthesia with corticosteroid injection and biceps tenodesis;  Surgeon: Tobie Priest, MD;  Location: [facility];  Service: Orthopedics;  Laterality: Right;   MOHS SURGERY     x 2 face nose and left cheek    OSTEOCHONDROMA EXCISION Left 05/29/2016    Procedure: OSTEOCHONDRAL DRILLING TALUS;  Surgeon: Sar Thresa HERO, DPM;  Location: [facility];  Service: Podiatry;  Laterality: Left;   PLANTAR FASCIA RELEASE Left 05/29/2016   Procedure: ENDOSCOPIC PLANTAR FASCIOTOMY;  Surgeon: Sar Thresa HERO, DPM;  Location: [facility];  Service: Podiatry;  Laterality: Left;   ROTATOR CUFF REPAIR Left    SHOULDER ARTHROSCOPY WITH BICEPSTENOTOMY Right 03/31/2022   Procedure: SHOULDER ARTHROSCOPY WITH BICEPS TENOTOMY, ARTHROSCOPIC LYSIS OF ADHESIONS, AND CAPSULAR RELEASE, AND EXTENSIVE GLENOHUMERAL DEBRIDEMENT;  Surgeon: Tobie Priest, MD;  Location: [facility];  Service: Orthopedics;  Laterality: Right;   SHOULDER ARTHROSCOPY WITH ROTATOR CUFF REPAIR  03/31/2022   Procedure: SHOULDER ARTHROSCOPY WITH ROTATOR CUFF REPAIR;  Surgeon: Tobie Priest, MD;  Location: [facility] ORS;  Service: Orthopedics;;   STOMACH SURGERY  2017   gastric sleeve surgery   TENNIS ELBOW RELEASE/NIRSCHEL PROCEDURE Left 08/05/2017   Procedure: TENNIS ELBOW RELEASE/NIRSCHEL PROCEDURE;  Surgeon: Glenn Helayne, MD;  Location: [facility] ORS;  Service: Orthopedics;  Laterality: Left;   TUBAL LIGATION     wedge resection of ovary  1982    Family Psychiatric History: I have reviewed family psychiatric history from progress note on 04/03/2022.  Family History:  Family History  Problem Relation Age of Onset   Heart disease Mother    Breast cancer Mother 5       metastatic, now hip, spine, and leg    Arthritis Mother    Hyperlipidemia Mother    Cancer Mother        spinal surgery. breast dx'ed age 41 now metastatic 13 as of 07/13/18   Cystic fibrosis Mother        carrier    Heart disease Father    Lung cancer Father        smoker   Hyperlipidemia Father    Hypertension Father    Depression Sister    Diabetes Sister    Stroke Sister    Other Sister        covid +   Diabetes Sister    Diabetes Maternal Aunt    Alcohol abuse Maternal Aunt    Diabetes Maternal Aunt    Alcohol abuse Maternal Uncle     Diabetes Maternal Uncle    Alcohol abuse Paternal Uncle    Diabetes Maternal Grandfather    Hyperlipidemia Maternal Grandfather    Arthritis Maternal Grandmother    Sudden death Cousin    Schizophrenia Cousin    Cystic fibrosis Other        great niece    Factor V Leiden deficiency Other        uncle and cousins and m GF (all maternal)   Alcohol abuse Niece    Alcohol  abuse Nephew    ADD / ADHD Son    Depression Son    ADD / ADHD Son    Post-traumatic stress disorder Son    Colon cancer Neg Hx    Ovarian cancer Neg Hx     Social History: I have reviewed social history from progress note on 04/03/2022. Social History   Socioeconomic History   Marital status: Married    Spouse name: Not on file   Number of children: 3   Years of education: Not on file   Highest education level: Some college, no degree  Occupational History   Not on file  Tobacco Use   Smoking status: Never   Smokeless tobacco: Never  Vaping Use   Vaping status: Never Used  Substance and Sexual Activity   Alcohol use: Never   Drug use: Never   Sexual activity: Yes    Partners: Male    Birth control/protection: Surgical  Other Topics Concern   Not on file  Social History Narrative   Married x 37 years as of 10/2017    Kids 3 boys youngest is Deward   Write childrens books    Retired from [employer] 01/2017    Enjoys traveling    Right handed   Caffeine: quit 2009   Social Drivers of [employer] Strain: Low Risk  (01/01/2023)   Overall Financial Resource Strain (CARDIA)    Difficulty of Paying Living Expenses: Not very hard  Food Insecurity: No Food Insecurity (01/01/2023)   Hunger Vital Sign    Worried About Running Out of Food in the Last Year: Never true    Ran Out of Food in the Last Year: Never true  Transportation Needs: No Transportation Needs (01/01/2023)   PRAPARE - Administrator, Civil Service (Medical): No    Lack of Transportation (Non-Medical): No   Physical Activity: Inactive (01/01/2023)   Exercise Vital Sign    Days of Exercise per Week: 0 days    Minutes of Exercise per Session: 20 min  Stress: Stress Concern Present (01/01/2023)   [employer] of Occupational Health - Occupational Stress Questionnaire    Feeling of Stress : Very much  Social Connections: Moderately Isolated (01/01/2023)   Social Connection and Isolation Panel [NHANES]    Frequency of Communication with Friends and Family: Three times a week    Frequency of Social Gatherings with Friends and Family: Patient declined    Attends Religious Services: Never    Database Administrator or Organizations: No    Attends Engineer, Structural: Not on file    Marital Status: Married    Allergies:  Allergies  Allergen Reactions   Tetracyclines & Related Swelling and Rash    FACE SWELL, RASH ON CHEST   Amoxicillin     Alcohol Rash    ABDOMINAL CRAMPS, drinking alcohol   Azithromycin  Rash   Metoprolol  Diarrhea    Metabolic Disorder Labs: Lab Results  Component Value Date   HGBA1C 4.9 01/01/2023   No results found for: PROLACTIN Lab Results  Component Value Date   CHOL 151 07/03/2022   TRIG 70.0 07/03/2022   HDL 59.20 07/03/2022   CHOLHDL 3 07/03/2022   VLDL 14.0 07/03/2022   LDLCALC 77 07/03/2022   LDLCALC 53 01/09/2022   Lab Results  Component Value Date   TSH 1.62 10/30/2022   TSH 2.524 08/18/2022    Therapeutic Level Labs: No results found for: LITHIUM No results found for: VALPROATE No  results found for: CBMZ  Current Medications: Current Outpatient Medications  Medication Sig Dispense Refill   acetaminophen  (TYLENOL ) 500 MG tablet Take 2 tablets (1,000 mg total) by mouth every 8 (eight) hours. 90 tablet 2   buPROPion  (WELLBUTRIN ) 75 MG tablet TAKE 1 TABLET (75 MG TOTAL) BY MOUTH IN THE MORNING 90 tablet 0   Calcium  Citrate-Vitamin D  500-10 MG-MCG CHEW Take 1 tablet two times a day     citalopram  (CELEXA ) 40 MG tablet  Take 1 tablet (40 mg total) by mouth daily. 90 tablet 1   clonazePAM  (KLONOPIN ) 0.5 MG tablet Take 0.5-1 tablets (0.25-0.5 mg total) by mouth daily as needed for anxiety. Limited supply 10 tablet 0   fexofenadine  (ALLEGRA ) 180 MG tablet Take 1 tablet (180 mg total) by mouth daily as needed for allergies or rhinitis. 90 tablet 3   gabapentin  (NEURONTIN ) 100 MG capsule Take 1 capsule (100 mg total) by mouth every morning. (Patient not taking: Reported on 01/28/2023) 30 capsule 0   gabapentin  (NEURONTIN ) 300 MG capsule TAKE 1 CAPSULE BY MOUTH 3 TIMES DAILY AS NEEDED. (Patient taking differently: Take 300 mg by mouth 2 (two) times daily. TAKE 1 CAPSULE BY MOUTH 2 TIMES DAILY AS NEEDED.) 90 capsule 5   hydrOXYzine  (ATARAX ) 25 MG tablet TAKE 0.5-1 TABLETS (12.5-25 MG TOTAL) BY MOUTH 3 (THREE) TIMES DAILY AS NEEDED FOR ANXIETY (AND SLEEP). 270 tablet 1   montelukast  (SINGULAIR ) 10 MG tablet Take 1 tablet (10 mg total) by mouth at bedtime. 90 tablet 3   omeprazole  (PRILOSEC) 40 MG capsule Take 1 capsule (40 mg total) by mouth daily. 90 capsule 3   ondansetron  (ZOFRAN -ODT) 4 MG disintegrating tablet Take 1 tablet (4 mg total) by mouth every 8 (eight) hours as needed. for nausea 20 tablet 2   Rimegepant Sulfate (NURTEC) 75 MG TBDP Take 1 tablet (75 mg total) by mouth daily as needed (take for abortive therapy of migraine, no more than 1 tablet in 24 hours or 10 per month). 10 tablet 2   rosuvastatin  (CRESTOR ) 20 MG tablet Take 1 tablet (20 mg total) by mouth daily. 90 tablet 2   tirzepatide  (MOUNJARO ) 2.5 MG/0.5ML Pen INJECT 2.5 MG SUBCUTANEOUSLY WEEKLY 6 mL 1   topiramate  (TOPAMAX ) 50 MG tablet Take 1 tablet (50 mg total) by mouth daily. 90 tablet 3   zolpidem  (AMBIEN  CR) 6.25 MG CR tablet Take 2 tablets (12.5 mg total) by mouth at bedtime as needed for sleep. 120 tablet 3   No current facility-administered medications for this visit.     Musculoskeletal: Strength & Muscle Tone:  UTA Gait & Station:   Seated Patient leans: N/A  Psychiatric Specialty Exam: Review of Systems  Psychiatric/Behavioral:  Positive for decreased concentration. The patient is nervous/anxious (improving).     There were no vitals taken for this visit.There is no height or weight on file to calculate BMI.  General Appearance: Fairly Groomed  Eye Contact:  Fair  Speech:  Clear and Coherent  Volume:  Normal  Mood:  Anxious improving  Affect:  Appropriate  Thought Process:  Goal Directed and Descriptions of Associations: Intact  Orientation:  Full (Time, Place, and Person)  Thought Content: Logical   Suicidal Thoughts:  No  Homicidal Thoughts:  No  Memory:  Immediate;   Fair Recent;   Fair Remote;   Fair  Judgement:  Fair  Insight:  Fair  Psychomotor Activity:  Normal  Concentration:  Concentration: Fair and Attention Span: Fair  Recall:  Fair  Fund of Knowledge: Fair  Language: Fair  Akathisia:  No  Handed:  Right  AIMS (if indicated): not done  Assets:  Desire for Improvement Housing Social Support  ADL's:  Intact  Cognition: WNL  Sleep:  Fair   Screenings: GAD-7    Garment/textile Technologist Visit from 01/28/2023 in [facility] Office Visit from 01/01/2023 in [facility] [employer] Visit from 10/30/2022 in [facility] [employer] Video Visit from 09/25/2022 in [facility] [employer] Visit from 09/23/2022 in [facility]  Total GAD-7 Score 16 14 13  0 16      PHQ2-9    Flowsheet Row Office Visit from 01/28/2023 in [facility] Office Visit from 01/01/2023 in [facility] Visit from 10/30/2022 in [facility] [employer] Video Visit from 09/25/2022 in [facility] [employer] Visit  from 09/23/2022 in [facility]  PHQ-2 Total Score 4 4 4  0 4  PHQ-9 Total Score 16 13 15  0 19      Flowsheet Row Video Visit from 02/27/2023 in [facility] Office Visit from 01/28/2023 in [facility] Video Visit from 10/28/2022 in [facility]  C-SSRS RISK CATEGORY No Risk No Risk No Risk        Assessment and Plan: Kristin [name] is a 63 year old Caucasian female, married, retired, lives in [place], has a history of anxiety, depression, multiple medical problems including chronic pain, bereavement was evaluated by telemedicine today.  Patient with recent addition of Wellbutrin  with positive response, discussed assessment and plan as noted below.  Major Depressive Disorder-improving Reports improved energy but no significant change in concentration. Current dosage has been in place for four weeks; typically requires eight weeks for full effect. Prefers to continue current dosage before considering adjustments. Working with therapist bi-weekly on core beliefs and perfectionism contributing to depressive symptoms. - Continue Wellbutrin  75 mg - Continue Celexa  40 mg - Reassess medication efficacy in four weeks - Continue bi-weekly therapy sessions with Ms. [name] Hummer.  Anxiety Disorder-improving Significant progress in coping mechanisms, evidenced by managing a recent fire alarm incident. Minimal use of clonazepam  recently. Anxiety managed through therapy and medication. - Continue Hydroxyzine  25 mg twice a day as needed - Continue Celexa  40 mg daily - Continue Clonazepam  0.25 to 0.5 mg daily as needed, has been limiting use. - Reassess anxiety management in four weeks  Grief-improving Improvement in coping with grief, with positive interactions and shared memories with sisters, indicating emotional progress. - Continue  support system and coping strategies - Monitor emotional well-being and grief symptoms  Insomnia-improving Currently denies any significant sleep problems. - Continue Ambien  CR 12.5 mg at bedtime as needed. - Continue sleep hygiene techniques.   Follow-up - Schedule follow-up appointment on March 14th at 11:20 AM by video.   Collaboration of Care: Collaboration of Care: Referral or follow-up with counselor/therapist AEB patient encouraged to continue CBT  Patient/Guardian was advised Release of Information must be obtained prior to any record release in order to collaborate their care with an outside provider. Patient/Guardian was advised if they have not already done so to contact the registration department to sign all necessary forms in order for us  to release information regarding their care.  Consent: Patient/Guardian gives verbal consent for treatment and assignment of benefits for services provided during this visit. Patient/Guardian expressed understanding and agreed to proceed.   This note was generated in part or whole with voice recognition software. Voice recognition is usually quite accurate but there are transcription errors that can and very often do occur. I apologize for any typographical errors that were not detected and corrected.    Koya Hunger, MD 02/27/2023, 12:19 PM

## 2023-03-02 DIAGNOSIS — E1169 Type 2 diabetes mellitus with other specified complication: Secondary | ICD-10-CM | POA: Diagnosis not present

## 2023-03-02 DIAGNOSIS — M4807 Spinal stenosis, lumbosacral region: Secondary | ICD-10-CM | POA: Diagnosis not present

## 2023-03-02 DIAGNOSIS — M51362 Other intervertebral disc degeneration, lumbar region with discogenic back pain and lower extremity pain: Secondary | ICD-10-CM | POA: Diagnosis not present

## 2023-03-02 DIAGNOSIS — M545 Low back pain, unspecified: Secondary | ICD-10-CM | POA: Diagnosis not present

## 2023-03-03 DIAGNOSIS — Z9089 Acquired absence of other organs: Secondary | ICD-10-CM | POA: Diagnosis not present

## 2023-03-03 DIAGNOSIS — K449 Diaphragmatic hernia without obstruction or gangrene: Secondary | ICD-10-CM | POA: Diagnosis not present

## 2023-03-03 DIAGNOSIS — R1310 Dysphagia, unspecified: Secondary | ICD-10-CM | POA: Diagnosis not present

## 2023-03-03 DIAGNOSIS — K219 Gastro-esophageal reflux disease without esophagitis: Secondary | ICD-10-CM | POA: Diagnosis not present

## 2023-03-03 DIAGNOSIS — R12 Heartburn: Secondary | ICD-10-CM | POA: Diagnosis not present

## 2023-03-04 ENCOUNTER — Other Ambulatory Visit: Payer: Self-pay | Admitting: Orthopedic Surgery

## 2023-03-04 DIAGNOSIS — M51362 Other intervertebral disc degeneration, lumbar region with discogenic back pain and lower extremity pain: Secondary | ICD-10-CM

## 2023-03-04 DIAGNOSIS — G8929 Other chronic pain: Secondary | ICD-10-CM

## 2023-03-04 DIAGNOSIS — M4807 Spinal stenosis, lumbosacral region: Secondary | ICD-10-CM

## 2023-03-10 ENCOUNTER — Ambulatory Visit
Admission: RE | Admit: 2023-03-10 | Discharge: 2023-03-10 | Disposition: A | Discharge status: Home / Self Care | Payer: BC Managed Care – PPO | Source: Ambulatory Visit | Attending: Orthopedic Surgery | Admitting: Orthopedic Surgery

## 2023-03-10 DIAGNOSIS — M51362 Other intervertebral disc degeneration, lumbar region with discogenic back pain and lower extremity pain: Secondary | ICD-10-CM | POA: Diagnosis not present

## 2023-03-10 DIAGNOSIS — G8929 Other chronic pain: Secondary | ICD-10-CM

## 2023-03-10 DIAGNOSIS — M4807 Spinal stenosis, lumbosacral region: Secondary | ICD-10-CM

## 2023-03-12 ENCOUNTER — Other Ambulatory Visit: Payer: BC Managed Care – PPO

## 2023-03-13 ENCOUNTER — Encounter: Payer: Self-pay | Admitting: Family Medicine

## 2023-03-16 ENCOUNTER — Ambulatory Visit (INDEPENDENT_AMBULATORY_CARE_PROVIDER_SITE_OTHER): Payer: BC Managed Care – PPO | Admitting: Family Medicine

## 2023-03-16 ENCOUNTER — Encounter: Payer: Self-pay | Admitting: Family Medicine

## 2023-03-16 VITALS — BP 104/66 | HR 67 | Temp 97.9°F | Resp 18 | Ht 62.0 in | Wt 127.0 lb

## 2023-03-16 DIAGNOSIS — F331 Major depressive disorder, recurrent, moderate: Secondary | ICD-10-CM | POA: Diagnosis not present

## 2023-03-16 DIAGNOSIS — F901 Attention-deficit hyperactivity disorder, predominantly hyperactive type: Secondary | ICD-10-CM | POA: Diagnosis not present

## 2023-03-16 DIAGNOSIS — M549 Dorsalgia, unspecified: Secondary | ICD-10-CM | POA: Diagnosis not present

## 2023-03-16 DIAGNOSIS — E1159 Type 2 diabetes mellitus with other circulatory complications: Secondary | ICD-10-CM

## 2023-03-16 DIAGNOSIS — Z7985 Long-term (current) use of injectable non-insulin antidiabetic drugs: Secondary | ICD-10-CM

## 2023-03-16 DIAGNOSIS — I152 Hypertension secondary to endocrine disorders: Secondary | ICD-10-CM | POA: Diagnosis not present

## 2023-03-16 DIAGNOSIS — F411 Generalized anxiety disorder: Secondary | ICD-10-CM | POA: Diagnosis not present

## 2023-03-16 DIAGNOSIS — R61 Generalized hyperhidrosis: Secondary | ICD-10-CM

## 2023-03-16 DIAGNOSIS — R350 Frequency of micturition: Secondary | ICD-10-CM

## 2023-03-16 DIAGNOSIS — E118 Type 2 diabetes mellitus with unspecified complications: Secondary | ICD-10-CM

## 2023-03-16 DIAGNOSIS — G8929 Other chronic pain: Secondary | ICD-10-CM

## 2023-03-16 LAB — COMPREHENSIVE METABOLIC PANEL
ALT: 25 U/L (ref 0–35)
AST: 23 U/L (ref 0–37)
Albumin: 4.3 g/dL (ref 3.5–5.2)
Alkaline Phosphatase: 61 U/L (ref 39–117)
BUN: 19 mg/dL (ref 6–23)
CO2: 27 meq/L (ref 19–32)
Calcium: 9.3 mg/dL (ref 8.4–10.5)
Chloride: 107 meq/L (ref 96–112)
Creatinine, Ser: 0.81 mg/dL (ref 0.40–1.20)
GFR: 77.32 mL/min (ref >60.00)
Glucose, Bld: 90 mg/dL (ref 70–99)
Potassium: 4.4 meq/L (ref 3.5–5.1)
Sodium: 141 meq/L (ref 135–145)
Total Bilirubin: 0.4 mg/dL (ref 0.2–1.2)
Total Protein: 6.5 g/dL (ref 6.0–8.3)

## 2023-03-16 LAB — CBC WITH DIFFERENTIAL/PLATELET
Basophils Absolute: 0.1 10*3/uL (ref 0.0–0.1)
Basophils Relative: 1.1 % (ref 0.0–3.0)
Eosinophils Absolute: 0.1 10*3/uL (ref 0.0–0.7)
Eosinophils Relative: 2.4 % (ref 0.0–5.0)
HCT: 40.6 % (ref 36.0–46.0)
Hemoglobin: 13.4 g/dL (ref 12.0–15.0)
Lymphocytes Relative: 43.5 % (ref 12.0–46.0)
Lymphs Abs: 2.5 10*3/uL (ref 0.7–4.0)
MCHC: 33.1 g/dL (ref 30.0–36.0)
MCV: 91.5 fL (ref 78.0–100.0)
Monocytes Absolute: 0.4 10*3/uL (ref 0.1–1.0)
Monocytes Relative: 6.3 % (ref 3.0–12.0)
Neutro Abs: 2.7 10*3/uL (ref 1.4–7.7)
Neutrophils Relative %: 46.7 % (ref 43.0–77.0)
Platelets: 217 10*3/uL (ref 150.0–400.0)
RBC: 4.43 Mil/uL (ref 3.87–5.11)
RDW: 14.1 % (ref 11.5–15.5)
WBC: 5.8 10*3/uL (ref 4.0–10.5)

## 2023-03-16 LAB — URINALYSIS, ROUTINE W REFLEX MICROSCOPIC
Bilirubin Urine: NEGATIVE
Hgb urine dipstick: NEGATIVE
Ketones, ur: NEGATIVE
Leukocytes,Ua: NEGATIVE
Nitrite: NEGATIVE
RBC / HPF: NONE SEEN (ref 0–?)
Specific Gravity, Urine: <=1.005 — AB (ref 1.000–1.030)
Total Protein, Urine: NEGATIVE
Urine Glucose: NEGATIVE
Urobilinogen, UA: 0.2 (ref 0.0–1.0)
pH: 6.5 (ref 5.0–8.0)

## 2023-03-16 LAB — HEMOGLOBIN A1C: Hgb A1c MFr Bld: 5.5 % (ref 4.6–6.5)

## 2023-03-16 LAB — TSH: TSH: 3.58 u[IU]/mL (ref 0.35–5.50)

## 2023-03-16 NOTE — Progress Notes (Unsigned)
 SUBJECTIVE:   Chief Complaint  Patient presents with   Night Sweats    Started before Christmas but getting worse.   HPI Presents for acute visit  Discussed the use of AI scribe software for clinical note transcription with the patient, who gave verbal consent to proceed.  History of Present Illness Kristin [name] "Kristin [name]" is a 63 year old female who presents with recurrent leg sweats and urinary frequency.  She experiences recurrent leg sweats, a symptom previously encountered during menopause. These sweats had subsided but have returned, becoming more pronounced since before Christmas. She wakes up four to five times a night due to feeling excessively hot, leading her to lower the thermostat to 60 degrees, which causes discomfort for her husband and son. She alternates between feeling extremely hot and then cold, necessitating showers both before bed and in the morning due to night sweats. No fevers, shortness of breath, or chest pain. There is no history of recent travel or exposure to infectious diseases such as tuberculosis. No cough.  Over the past year, she has experienced a change in urinary habits, characterized by increased frequency and urgency, often resulting in only a dribble of urine. No pain during urination, blood in the urine, or abdominal pain.  Her medication regimen includes Topamax for migraines, which she has been on for a while, and Wellbutrin, started in January at a dose of 75 mg. She notes that Wellbutrin can cause sweating, which she had not previously considered. She also takes gabapentin twice a day for back pain, and occasionally uses another pain medication instead of gabapentin, though she is uncertain of its name.  She has not taken [facility] for about a month, which she attributes to a recent weight gain from 118 to 126 pounds.      PERTINENT PMH / PSH: As above  OBJECTIVE:  BP 104/66   Pulse 67   Temp 97.9 F (36.6 C)   Resp 18   Ht 5\' 2"   (1.575 m)   Wt 127 lb (57.6 kg)   SpO2 98%   BMI 23.23 kg/m    Physical Exam Vitals reviewed.  Constitutional:      General: She is not in acute distress.    Appearance: Normal appearance. She is normal weight. She is not ill-appearing, toxic-appearing or diaphoretic.  HENT:     Head: Normocephalic.     Mouth/Throat:     Mouth: Mucous membranes are moist.  Eyes:     General:        Right eye: No discharge.        Left eye: No discharge.     Conjunctiva/sclera: Conjunctivae normal.  Neck:     Thyroid: No thyromegaly or thyroid tenderness.  Cardiovascular:     Rate and Rhythm: Normal rate and regular rhythm.     Heart sounds: Normal heart sounds.  Pulmonary:     Effort: Pulmonary effort is normal.     Breath sounds: Normal breath sounds.  Abdominal:     General: Bowel sounds are normal. There is no distension.     Tenderness: There is no abdominal tenderness.  Musculoskeletal:        General: Normal range of motion.  Skin:    General: Skin is warm and dry.  Neurological:     General: No focal deficit present.     Mental Status: She is alert and oriented to person, place, and time. Mental status is at baseline.  Psychiatric:  Mood and Affect: Mood normal.        Behavior: Behavior normal.        Thought Content: Thought content normal.        Judgment: Judgment normal.           03/16/2023    9:11 AM 01/28/2023    2:31 PM 01/28/2023    2:13 PM 01/01/2023    2:43 PM 10/30/2022   10:52 AM  Depression screen PHQ 2/9  Decreased Interest 1   2 2   Down, Depressed, Hopeless 2   2 2   PHQ - 2 Score 3   4 4   Altered sleeping 3   2 2   Tired, decreased energy 2   2 3   Change in appetite 3   1 3   Feeling bad or failure about yourself  3   2 1   Trouble concentrating 1   2 2   Moving slowly or fidgety/restless 0   0 0  Suicidal thoughts 0   0 0  PHQ-9 Score 15   13 15   Difficult doing work/chores Somewhat difficult   Very difficult Very difficult     Information is  confidential and restricted. Go to Review Flowsheets to unlock data.      03/16/2023    9:11 AM 01/28/2023    2:30 PM 01/01/2023    2:44 PM 10/30/2022   10:53 AM  GAD 7 : Generalized Anxiety Score  Nervous, Anxious, on Edge 3  3 3   Control/stop worrying 3  3 3   Worry too much - different things 3  3 3   Trouble relaxing 2  2 2   Restless 1  0 0  Easily annoyed or irritable 1  1 0  Afraid - awful might happen 3  2 2   Total GAD 7 Score 16  14 13   Anxiety Difficulty Extremely difficult  Very difficult Very difficult     Information is confidential and restricted. Go to Review Flowsheets to unlock data.    ASSESSMENT/PLAN:  Night sweats Assessment & Plan: Recurrent night sweats, similar to previous menopausal symptoms. No associated fever, shortness of breath, or chest pain. Recent initiation of Wellbutrin, which can cause diaphoresis as a side effect. Symptoms start at the time of initiation of medication. -Order labs to rule out other causes (thyroid, electrolyte abnormalities, infection). -Consult with prescribing psychiatrist (Dr. Mariam Dollar) regarding potential Wellbutrin side effect.  Orders: -     Comprehensive metabolic panel -     CBC with Differential/Platelet -     Hemoglobin A1c -     TSH -     HIV Antibody (routine testing w rflx) -     Urinalysis, Routine w reflex microscopic  Urinary frequency Assessment & Plan: Increased urgency and frequency over the past year, with no associated dysuria or hematuria. DM well controlled.  Recent Calcium normal -Order urinalysis to rule out infection. -Check Cmet    Hypertension associated with diabetes [facility]) Assessment & Plan: Chronic.  Well-controlled without medication    Chronic back pain, unspecified back location, unspecified back pain laterality Assessment & Plan: Chronic Chronic back pain managed  pain management  -Continue Gabapentin continue 300mg  three times day as prescribed-Continue Tizanidine as prescribed   -Continue Celebrex 200 mg BID as prescribed  -Continue to follow up with PA for management   Type 2 diabetes mellitus with complications (HCC) Assessment & Plan: Recent unintentional discontinuation of Mounjaro for approximately one month, with associated increase in appetite and weight gain. -Check A1c. -Resume Greggory Keen  2.5mg  every other week.      PDMP reviewed  Return if symptoms worsen or fail to improve, for PCP.  Dana Allan, MD

## 2023-03-16 NOTE — Patient Instructions (Addendum)
 It was a pleasure meeting you today. Thank you for allowing me to take part in your health care.  Our goals for today as we discussed include:  We will get some labs today.  If they are abnormal or we need to do something about them, I will call you.  If they are normal, I will send you a message on MyChart (if it is active) or a letter in the mail.  If you don't hear from [place] in 2 weeks, please call the office at the number below.   Follow up with Dr Elna Breslow to discuss side effects of medications.  Wellbutrin side effect is diaphoresis in 5-22% in > 10% of reported patients who take this medication.  Recommend Pneumonia 20 vaccine.  1 time vaccine that covers 20 variants of pneumonia  Recommend Tetanus Vaccination.  This is given every 10 years.   This is a list of the screening recommended for you and due dates:  Health Maintenance  Topic Date Due   Pneumococcal Vaccination (2 of 2 - PPSV23 or PCV20) 02/19/2021   COVID-19 Vaccine (7 - 2024-25 season) 09/21/2022   DTaP/Tdap/Td vaccine (3 - Td or Tdap) 01/09/2023   Hemoglobin A1C  07/02/2023   Eye exam for diabetics  08/04/2023   Yearly kidney function blood test for diabetes  10/30/2023   Yearly kidney health urinalysis for diabetes  01/01/2024   Pap with HPV screening  08/18/2024   Mammogram  10/26/2024   Colon Cancer Screening  01/30/2029   Flu Shot  Completed   Hepatitis C Screening  Completed   HIV Screening  Completed   Zoster (Shingles) Vaccine  Completed   HPV Vaccine  Aged Out   Complete foot exam   Discontinued     If you have any questions or concerns, please do not hesitate to call the office at [phone].  I look forward to our next visit and until then take care and stay safe.  Regards,   Dana Allan, MD   [facility]

## 2023-03-17 ENCOUNTER — Other Ambulatory Visit: Payer: Self-pay | Admitting: Family Medicine

## 2023-03-17 DIAGNOSIS — G4709 Other insomnia: Secondary | ICD-10-CM

## 2023-03-17 LAB — HIV ANTIBODY (ROUTINE TESTING W REFLEX): HIV 1&2 Ab, 4th Generation: NONREACTIVE

## 2023-03-19 DIAGNOSIS — R1310 Dysphagia, unspecified: Secondary | ICD-10-CM | POA: Diagnosis not present

## 2023-03-19 DIAGNOSIS — R1319 Other dysphagia: Secondary | ICD-10-CM | POA: Diagnosis not present

## 2023-03-20 ENCOUNTER — Encounter: Payer: Self-pay | Admitting: Family Medicine

## 2023-03-20 DIAGNOSIS — R61 Generalized hyperhidrosis: Secondary | ICD-10-CM | POA: Insufficient documentation

## 2023-03-20 NOTE — Assessment & Plan Note (Signed)
 Chronic Chronic back pain managed  pain management  -Continue Gabapentin continue 300mg  three times day as prescribed-Continue Tizanidine as prescribed  -Continue Celebrex 200 mg BID as prescribed  -Continue to follow up with PA for management

## 2023-03-20 NOTE — Assessment & Plan Note (Signed)
 Recent unintentional discontinuation of Mounjaro for approximately one month, with associated increase in appetite and weight gain. -Check A1c. -Resume Mounjaro 2.5mg  every other week.

## 2023-03-20 NOTE — Assessment & Plan Note (Signed)
 Recurrent night sweats, similar to previous menopausal symptoms. No associated fever, shortness of breath, or chest pain. Recent initiation of Wellbutrin, which can cause diaphoresis as a side effect. Symptoms start at the time of initiation of medication. -Order labs to rule out other causes (thyroid, electrolyte abnormalities, infection). -Consult with prescribing psychiatrist (Dr. Mariam Dollar) regarding potential Wellbutrin side effect.

## 2023-03-20 NOTE — Assessment & Plan Note (Signed)
 Chronic.  Well-controlled  without medication

## 2023-03-20 NOTE — Assessment & Plan Note (Signed)
 Increased urgency and frequency over the past year, with no associated dysuria or hematuria. DM well controlled.  Recent Calcium normal -Order urinalysis to rule out infection. -Check Cmet

## 2023-04-03 ENCOUNTER — Telehealth: Payer: Self-pay | Admitting: Psychiatry

## 2023-04-03 DIAGNOSIS — M5416 Radiculopathy, lumbar region: Secondary | ICD-10-CM | POA: Diagnosis not present

## 2023-04-05 ENCOUNTER — Other Ambulatory Visit: Payer: Self-pay | Admitting: Nurse Practitioner

## 2023-04-05 DIAGNOSIS — J309 Allergic rhinitis, unspecified: Secondary | ICD-10-CM

## 2023-04-08 DIAGNOSIS — F901 Attention-deficit hyperactivity disorder, predominantly hyperactive type: Secondary | ICD-10-CM | POA: Diagnosis not present

## 2023-04-08 DIAGNOSIS — F331 Major depressive disorder, recurrent, moderate: Secondary | ICD-10-CM | POA: Diagnosis not present

## 2023-04-08 DIAGNOSIS — F411 Generalized anxiety disorder: Secondary | ICD-10-CM | POA: Diagnosis not present

## 2023-04-14 DIAGNOSIS — M5416 Radiculopathy, lumbar region: Secondary | ICD-10-CM | POA: Diagnosis not present

## 2023-04-14 DIAGNOSIS — M5441 Lumbago with sciatica, right side: Secondary | ICD-10-CM | POA: Diagnosis not present

## 2023-04-14 DIAGNOSIS — G8929 Other chronic pain: Secondary | ICD-10-CM | POA: Diagnosis not present

## 2023-04-14 DIAGNOSIS — M5442 Lumbago with sciatica, left side: Secondary | ICD-10-CM | POA: Diagnosis not present

## 2023-04-24 DIAGNOSIS — M5416 Radiculopathy, lumbar region: Secondary | ICD-10-CM | POA: Diagnosis not present

## 2023-04-30 ENCOUNTER — Encounter: Payer: Self-pay | Admitting: Psychiatry

## 2023-04-30 ENCOUNTER — Telehealth: Payer: Self-pay | Admitting: Psychiatry

## 2023-04-30 DIAGNOSIS — Z634 Disappearance and death of family member: Secondary | ICD-10-CM | POA: Diagnosis not present

## 2023-04-30 DIAGNOSIS — F411 Generalized anxiety disorder: Secondary | ICD-10-CM

## 2023-04-30 DIAGNOSIS — G4700 Insomnia, unspecified: Secondary | ICD-10-CM | POA: Diagnosis not present

## 2023-04-30 DIAGNOSIS — F3341 Major depressive disorder, recurrent, in partial remission: Secondary | ICD-10-CM | POA: Diagnosis not present

## 2023-04-30 MED ORDER — BUPROPION HCL 75 MG PO TABS: 75.0000 mg | ORAL_TABLET | Freq: Every morning | ORAL | 1 refills | Status: DC

## 2023-04-30 MED ORDER — CLONAZEPAM 0.5 MG PO TABS: 0.2500 mg | ORAL_TABLET | Freq: Every day | ORAL | 0 refills | Status: AC | PRN

## 2023-04-30 MED ORDER — CITALOPRAM HYDROBROMIDE 40 MG PO TABS: 40.0000 mg | ORAL_TABLET | Freq: Every day | ORAL | 1 refills | Status: DC

## 2023-04-30 NOTE — Progress Notes (Unsigned)
 Virtual Visit via Video Note  I connected with Kristin [name] on 04/30/23 at  3:40 PM EDT by a video enabled telemedicine application and verified that I am speaking with the correct person using two identifiers.  Location Provider Location : [facility] Patient Location : Home  Participants: Patient , Spouse,Provider    I discussed the limitations of evaluation and management by telemedicine and the availability of in person appointments. The patient expressed understanding and agreed to proceed   I discussed the assessment and treatment plan with the patient. The patient was provided an opportunity to ask questions and all were answered. The patient agreed with the plan and demonstrated an understanding of the instructions.   The patient was advised to call back or seek an in-person evaluation if the symptoms worsen or if the condition fails to improve as anticipated.   BH MD OP Progress Note  05/01/2023 8:06 AM Kristin [name]  MRN:  [id number]  Chief Complaint:  Chief Complaint  Patient presents with   Follow-up   Anxiety   Depression   Medication Refill   HPI: Kristin [name] is a 63 year old Caucasian female, married, retired, lives in [place], has a history of GAD, MDD, insomnia, bereavement, diabetes mellitus, polyneuropathy, tendinopathy of right biceps status post right shoulder arthroscopy, chronic migraine was evaluated by telemedicine today.  There is an improvement in depression symptoms, as she no longer starts her day by staying in bed with a pillow over her head. She is more active, engaging in activities like house cleaning and tending to her hydroponic garden. Despite these improvements, significant anxiety persists, especially when she perceives herself as making mistakes, leading to a cycle of anxiety and upset that results in returning to bed.  She continues to experience significant anxiety, particularly when she feels she has done something wrong,  which can lead to rumination and returning to bed. She has clonazepam from a previous prescription to manage acute anxiety, especially during travel. She is concerned about anxiety related to flying and being in large crowds, as she is planning a cruise for her sister's birthday. She has a supportive family, with sisters who understand her need to avoid crowded spaces.  She has difficulty falling asleep without Ambien, which she takes nightly. Initially, she experienced night sweats upon starting Wellbutrin and Celexa, but these resolved after adjusting the medication schedule. Her husband is affected by her sleep disturbances.  She continues to follow up with therapist and therapy sessions are beneficial.  She has a history of back pain due to an impingement likely related to worsening arthritis. A recent epidural cortisone injection has helped alleviate the pain, although it is not completely resolved.  Her diabetes is well-controlled with Mounjaro, with an A1c reduced to 5.3 from nearly 9. She has adjusted the frequency of Mounjaro injections to every other week, and her weight has remained stable.  She has been married for nearly 42 years and enjoys gardening, maintaining a hydroponic garden at home. She is planning a cruise with her sisters.  Visit Diagnosis:    ICD-10-CM   1. GAD (generalized anxiety disorder)  F41.1 clonazePAM (KLONOPIN) 0.5 MG tablet    citalopram (CELEXA) 40 MG tablet    2. MDD (major depressive disorder), recurrent, in partial remission (HCC)  F33.41 citalopram (CELEXA) 40 MG tablet    buPROPion (WELLBUTRIN) 75 MG tablet    3. Insomnia, unspecified type  G47.00 clonazePAM (KLONOPIN) 0.5 MG tablet    4. Bereavement  Z63.4 clonazePAM (  KLONOPIN) 0.5 MG tablet      Past Psychiatric History: I have reviewed past psychiatric history from progress note on 04/03/2022.  Past trials of medications like Prozac, trazodone, nortriptyline, amitriptyline for headaches,  neuropathy.  Patient had ADHD testing completed at [facility]-no formal diagnosis of ADHD at this time.  Patient may need a reevaluation once anxiety symptoms are controlled.  Past Medical History:  Past Medical History:  Diagnosis Date   ADHD    Adhesive capsulitis of right shoulder 07/23/2020   Allergy    Annual physical exam 08/19/2019   Anxiety 12/12/2015   Arthritis    Basal cell carcinoma    nose, left cheek, right upper lip, right posterior shoulder most recent 6 years ago on left cheek   Basal cell carcinoma 03/26/2021   L chest, EDC 04/24/2021   Benign essential hypertension 08/14/2014   Bleeding    Bursitis of right shoulder 12/25/2020   Chicken pox    Chronic diarrhea    Chronic intractable headache 10/20/2017   Chronic migraine w/o aura, not intractable, w/o stat migr 08/08/2020   Complication of anesthesia    blood pressure elevated after gastric sleeve surgery in recovery, pt does not want mask over face; migraines   COVID-19    03/01/20 had MAB 03/03/20 and felt better next day 03/04/20   CTS (carpal tunnel syndrome) 04/05/2020   Dr. Sherryll Burger s/p [facility]/Lidocaine injection 12/05/19   Depression    Diabetes mellitus without complication (HCC)    pre-diabetic currently, was diabetic prior to gastric sleeve surgery    Diabetic polyneuropathy associated with diabetes mellitus due to underlying condition (HCC) 08/08/2020   Disorder of right rotator cuff    10/03/19 MRI right shoulder abnormal Dr. Zettie Cooley   Fall    08/2020   Fall    03/28/21   Fatty liver    Fatty liver 05/06/2017   [place] + 2013    Gout    Hematuria 10/15/2018   History of shingles 06/19/2021   lower back and R side of thigh   Hives 03/28/2020   Hyperlipidemia    Hypertension    no problems since losing weight after gastric sleeve surgery   Hypertension 07/17/2014   IBS (irritable bowel syndrome)    Injury of right wrist 06/26/2016   Insomnia    Migraine    Neuropathy    bilateral  feet   Obesity    Obesity (BMI 30-39.9) 05/06/2017   Obesity, diabetes, and hypertension syndrome (HCC) 08/18/2019   Osteopenia    Palpitations 08/14/2014   PONV (postoperative nausea and vomiting)    Squamous cell carcinoma of skin 03/26/2021   SCC IS R mid sternum, EDC 04/24/2021   Tendonitis of shoulder, right 12/25/2020    Past Surgical History:  Procedure Laterality Date   ABDOMINAL HYSTERECTOMY     no h/o abnormal pap still has 1 ovary left   ANKLE ARTHROSCOPY Left 05/29/2016   Procedure: ANKLE ARTHROSCOPY;  Surgeon: Felecia Shelling, DPM;  Location: [facility];  Service: Podiatry;  Laterality: Left;   APPENDECTOMY     basal cell removed     CESAREAN SECTION N/A    1985 & 1995   CHOLECYSTECTOMY     CLOSED MANIPULATION SHOULDER WITH STERIOD INJECTION Right 03/31/2022   Procedure: MANIPULATION UNDER ANESTHESIA WITH GLENOHUMERAL AND SUBACROMIAL CORTICOSTERIOD INJECTION;  Surgeon: Signa Kell, MD;  Location: [facility] ORS;  Service: Orthopedics;  Laterality: Right;   COLONOSCOPY WITH PROPOFOL N/A 01/31/2019   Procedure: COLONOSCOPY  WITH PROPOFOL;  Surgeon: Toney Reil, MD;  Location: [facility] ENDOSCOPY;  Service: Gastroenterology;  Laterality: N/A;   cyst removed from wrist     ESOPHAGOGASTRODUODENOSCOPY (EGD) WITH PROPOFOL N/A 01/31/2019   Procedure: ESOPHAGOGASTRODUODENOSCOPY (EGD) WITH PROPOFOL;  Surgeon: Toney Reil, MD;  Location[facility] END[facility]COPY;  Service: Gastroenterology;  Laterality: N/A;   ESOPHAGOGASTRODUODENOSCOPY (EGD) WITH PROPOFOL N/A 05/20/2022   Procedure: ESOPHAGOGASTRODUODENOSCOPY (EGD) WITH PROPOFOL;  Surgeon: Toney Reil, MD;  Location[facility] END[facility];  Service: Gastroenterology;  Laterality: N/A;   ESOPHAGOGASTRODUODENOSCOPY (EGD) WITH PROPOFOL N/A 09/24/2022   Procedure: ESOPHAGOGASTRODUODENOSCOPY (EGD) WITH PROPOFOL;  Surgeon: Toney Reil, MD;  Location: [facility] ENDOSCOPY;  Service: Gastroenterology;  Laterality: N/A;   FLEXIBLE SIGMOIDOSCOPY N/A  05/21/2020   Procedure: FLEXIBLE SIGMOIDOSCOPY;  Surgeon: Toney Reil, MD;  Location: [facility] ENDOSCOPY;  Service: Gastroenterology;  Laterality: N/A;   FOOT SURGERY     left foot 11/2016 Dr. Vinnie Level    left elbow surgery     07/2017 Dr Erin Sons epicondylitis    LYSIS OF ADHESION Right 08/27/2021   Procedure: Right shoulder arthroscopic lysis of adhesions, capsular release, and manipulation under anesthesia with corticosteroid injection and biceps tenodesis;  Surgeon: Signa Kell, MD;  Location: [facility];  Service: Orthopedics;  Laterality: Right;   MOHS SURGERY     x 2 face nose and left cheek    OSTEOCHONDROMA EXCISION Left 05/29/2016   Procedure: OSTEOCHONDRAL DRILLING TALUS;  Surgeon: Felecia Shelling, DPM;  Location: [facility];  Service: Podiatry;  Laterality: Left;   PLANTAR FASCIA RELEASE Left 05/29/2016   Procedure: ENDOSCOPIC PLANTAR FASCIOTOMY;  Surgeon: Felecia Shelling, DPM;  Location[facility];  Service: Podiatry;  Laterality: Left;   ROTATOR CUFF REPAIR Left    SHOULDER ARTHROSCOPY WITH BICEPSTENOTOMY Right 03/31/2022   Procedure: SHOULDER ARTHROSCOPY WITH BICEPS TENOTOMY, ARTHROSCOPIC LYSIS OF ADHESIONS, AND CAPSULAR RELEASE, AND EXTENSIVE GLENOHUMERAL DEBRIDEMENT;  Surgeon: Signa Kell, MD;  Location: [facility] ORS;  Service: Orthopedics;  Laterality: Right;   SHOULDER ARTHROSCOPY WITH ROTATOR CUFF REPAIR  03/31/2022   Procedure: SHOULDER ARTHROSCOPY WITH ROTATOR CUFF REPAIR;  Surgeon: Signa Kell, MD;  Location: [facility] ORS;  Service: Orthopedics;;   STOMACH SURGERY  2017   gastric sleeve surgery   TENNIS ELBOW RELEASE/NIRSCHEL PROCEDURE Left 08/05/2017   Procedure: TENNIS ELBOW RELEASE/NIRSCHEL PROCEDURE;  Surgeon: Erin Sons, MD;  Location: [facility] ORS;  Service: Orthopedics;  Laterality: Left;   TUBAL LIGATION     wedge resection of ovary  1982    Family Psychiatric History: I have reviewed family psychiatric history from progress note on 04/03/2022.  Family  History:  Family History  Problem Relation Age of Onset   Heart disease Mother    Breast cancer Mother 48       metastatic, now hip, spine, and leg    Arthritis Mother    Hyperlipidemia Mother    Cancer Mother        spinal surgery. breast dx'ed age 29 now metastatic 49 as of 07/13/18   Cystic fibrosis Mother        carrier    Heart disease Father    Lung cancer Father        smoker   Hyperlipidemia Father    Hypertension Father    Depression Sister    Diabetes Sister    Stroke Sister    Other Sister        covid +   Diabetes Sister    Diabetes Maternal Aunt  Alcohol abuse Maternal Aunt    Diabetes Maternal Aunt    Alcohol abuse Maternal Uncle    Diabetes Maternal Uncle    Alcohol abuse Paternal Uncle    Diabetes Maternal Grandfather    Hyperlipidemia Maternal Grandfather    Arthritis Maternal Grandmother    Sudden death Cousin    Schizophrenia Cousin    Cystic fibrosis Other        great niece    Factor V Leiden deficiency Other        uncle and cousins and m GF (all maternal)   Alcohol abuse Niece    Alcohol abuse Nephew    ADD / ADHD Son    Depression Son    ADD / ADHD Son    Post-traumatic stress disorder Son    Colon cancer Neg Hx    Ovarian cancer Neg Hx     Social History: I have reviewed social history from progress note on 04/03/2022. Social History   Socioeconomic History   Marital status: Married    Spouse name: Not on file   Number of children: 3   Years of education: Not on file   Highest education level: Some college, no degree  Occupational History   Not on file  Tobacco Use   Smoking status: Never   Smokeless tobacco: Never  Vaping Use   Vaping status: Never Used  Substance and Sexual Activity   Alcohol use: Never   Drug use: Never   Sexual activity: Yes    Partners: Male    Birth control/protection: Surgical  Other Topics Concern   Not on file  Social History Narrative   Married x 37 years as of 10/2017    Kids 3 boys youngest  is Renae Fickle   Write childrens books    Retired from [employer] 01/2017    Enjoys traveling    Right handed   Caffeine: quit 2009   Social Drivers of [employer] Strain: Low Risk  (04/14/2023)   Received from [facility]   Overall Financial Resource Strain (CARDIA)    Difficulty of Paying Living Expenses: Not very hard  Food Insecurity: No Food Insecurity (04/14/2023)   [facility]   Hunger Vital Sign    Worried About Running Out of Food in the Last Year: Never true    Ran Out of Food in the Last Year: Never true  Transportation Needs: No Transportation Needs (04/14/2023)   [facility] from [facility] - Transportation    In the past 12 months, has lack of transportation kept you from medical appointments or from getting medications?: No    Lack of Transportation (Non-Medical): No  Physical Activity: Inactive (01/01/2023)   Exercise Vital Sign    Days of Exercise per Week: 0 days    Minutes of Exercise per Session: 20 min  Stress: Stress Concern Present (01/01/2023)   [employer] of Occupational Health - Occupational Stress Questionnaire    Feeling of Stress : Very much  Social Connections: Moderately Isolated (01/01/2023)   Social Connection and Isolation Panel [NHANES]    Frequency of Communication with Friends and Family: Three times a week    Frequency of Social Gatherings with Friends and Family: Patient declined    Attends Religious Services: Never    Database administrator or Organizations: No    Attends Engineer, structural: Not on file    Marital Status: Married    Allergies:  Allergies  Allergen Reactions   Tetracyclines & Related Swelling and Rash    FACE SWELL, RASH ON CHEST   Amoxicillin    Alcohol Rash    ABDOMINAL CRAMPS, drinking alcohol   Azithromycin Rash   Metoprolol Diarrhea    Metabolic Disorder Labs: Lab Results  Component Value Date   HGBA1C 5.5  03/16/2023   No results found for: "PROLACTIN" Lab Results  Component Value Date   CHOL 151 07/03/2022   TRIG 70.0 07/03/2022   HDL 59.20 07/03/2022   CHOLHDL 3 07/03/2022   VLDL 14.0 07/03/2022   LDLCALC 77 07/03/2022   LDLCALC 53 01/09/2022   Lab Results  Component Value Date   TSH 3.58 03/16/2023   TSH 1.62 10/30/2022    Therapeutic Level Labs: No results found for: "LITHIUM" No results found for: "VALPROATE" No results found for: "CBMZ"  Current Medications: Current Outpatient Medications  Medication Sig Dispense Refill   buPROPion (WELLBUTRIN) 75 MG tablet Take 1 tablet (75 mg total) by mouth in the morning. 90 tablet 1   citalopram (CELEXA) 40 MG tablet Take 1 tablet (40 mg total) by mouth daily. 90 tablet 1   clonazePAM (KLONOPIN) 0.5 MG tablet Take 0.5-1 tablets (0.25-0.5 mg total) by mouth daily as needed for anxiety. Limited supply 10 tablet 0   fexofenadine (ALLEGRA) 180 MG tablet Take 1 tablet (180 mg total) by mouth daily as needed for allergies or rhinitis. 90 tablet 3   gabapentin (NEURONTIN) 300 MG capsule TAKE 1 CAPSULE BY MOUTH 3 TIMES DAILY AS NEEDED. (Patient taking differently: Take 300 mg by mouth 2 (two) times daily. TAKE 1 CAPSULE BY MOUTH 2 TIMES DAILY AS NEEDED.) 90 capsule 5   hydrOXYzine (ATARAX) 25 MG tablet TAKE 0.5-1 TABLETS (12.5-25 MG TOTAL) BY MOUTH 3 (THREE) TIMES DAILY AS NEEDED FOR ANXIETY (AND SLEEP). 270 tablet 1   montelukast (SINGULAIR) 10 MG tablet TAKE 1 TABLET BY MOUTH EVERYDAY AT BEDTIME 90 tablet 3   omeprazole (PRILOSEC) 40 MG capsule Take 1 capsule (40 mg total) by mouth daily. 90 capsule 3   ondansetron (ZOFRAN-ODT) 4 MG disintegrating tablet Take 1 tablet (4 mg total) by mouth every 8 (eight) hours as needed. for nausea 20 tablet 2   Rimegepant Sulfate (NURTEC) 75 MG TBDP Take 1 tablet (75 mg total) by mouth daily as needed (take for abortive therapy of migraine, no more than 1 tablet in 24 hours or 10 per month). 10 tablet 2    rosuvastatin (CRESTOR) 20 MG tablet Take 1 tablet (20 mg total) by mouth daily. 90 tablet 2   tirzepatide (MOUNJARO) 2.5 MG/0.5ML Pen INJECT 2.5 MG SUBCUTANEOUSLY WEEKLY 6 mL 1   topiramate (TOPAMAX) 50 MG tablet Take 1 tablet (50 mg total) by mouth daily. 90 tablet 3   zolpidem (AMBIEN CR) 6.25 MG CR tablet TAKE 2 TABLETS (12.5 MG TOTAL) BY MOUTH AT BEDTIME AS NEEDED FOR SLEEP. 60 tablet 3   No current facility-administered medications for this visit.     Musculoskeletal: Strength & Muscle Tone:  UTA Gait & Station:  Seated Patient leans: N/A  Psychiatric Specialty Exam: Review of Systems  Psychiatric/Behavioral:  The patient is nervous/anxious.     There were no vitals taken for this visit.There is no height or weight on file to calculate BMI.  General Appearance: Casual  Eye Contact:  Fair  Speech:  Normal Rate  Volume:  Normal  Mood:  Anxious  Affect:  Appropriate  Thought Process:  Goal Directed and  Descriptions of Associations: Intact  Orientation:  Full (Time, Place, and Person)  Thought Content: Logical   Suicidal Thoughts:  No  Homicidal Thoughts:  No  Memory:  Immediate;   Fair Recent;   Fair Remote;   Fair  Judgement:  Fair  Insight:  Fair  Psychomotor Activity:  Normal  Concentration:  Concentration: Fair and Attention Span: Fair  Recall:  [employer] of Knowledge: Fair  Language: Fair  Akathisia:  No  Handed:  Right  AIMS (if indicated): not done  Assets:  Desire for Improvement Housing Social Support Vocational/Educational  ADL's:  Intact  Cognition: WNL  Sleep:  Fair   Screenings: GAD-7    Garment/textile technologist Visit from 03/16/2023 in [facility] [employer] Visit from 01/28/2023 in [facility] Office Visit from 01/01/2023 in [facility] [employer] Visit from 10/30/2022 in [facility] [employer] Video Visit  from 09/25/2022 in [facility] [employer]  Total GAD-7 Score 16 16 14 13  0      PHQ2-9    Flowsheet Row Office Visit from 03/16/2023 in [facility] [employer] Visit from 01/28/2023 in [facility] Office Visit from 01/01/2023 in [facility] Visit from 10/30/2022 in [facility] [employer] Video Visit from 09/25/2022 in [facility] at [employer]  PHQ-2 Total Score 3 4 4 4  0  PHQ-9 Total Score 15 16 13 15  0      Flowsheet Row Video Visit from 04/30/2023 in [facility] Video Visit from 02/27/2023 in [facility] Office Visit from 01/28/2023 in [facility]  C-SSRS RISK CATEGORY No Risk No Risk No Risk        Assessment and Plan: Kristin [name] is a 63 year old Caucasian female, married, retired, lives in [place], has a history of anxiety, depression, multiple medical problems including chronic pain, bereavement was evaluated by telemedicine today.  Discussed assessment and plan as noted below.  Major Depressive Disorder in partial remission Improvement in depressive symptoms with increased activity, such as gardening and house cleaning. Engaging in therapy. - Continue Wellbutrin 75 mg and Celexa 40 mg daily - Encourage continued therapy sessions  Generalized Anxiety Disorder-improving Persistent anxiety related to order and crowded situations. Plans to use clonazepam for anxiety during an upcoming cruise. - Prescribe Clonazepam 0.25 to 0.5 mg as needed for situational anxiety during travel - Reviewed [place] PMP AWARxE - Continue Hydroxyzine 25 mg twice a day as needed - Encourage use of support system during the cruise - Continue therapy sessions to address anxiety  patterns  Insomnia-improving Relies on Ambien for sleep, with difficulty sleeping without it. Acknowledges potential habit-forming nature. - Continue Ambien 12.5 mg as needed for sleep - Discuss potential habit-forming nature of Ambien and encourage sleep hygiene practices  Bereavement-improving Currently coping with grief, has good social support system and is motivated to stay in therapy. - Continue CBT with Ms. Felecia Jan.  Follow-up Follow-up in clinic in 2 to 3 months or sooner if needed.  Collaboration of Care: Collaboration of Care: Referral or follow-up with counselor/therapist AEB continue psychotherapy sessions.  Has upcoming appointment.  Patient/Guardian was advised Release of Information must be obtained prior to any record release in order to collaborate their care  with an outside provider. Patient/Guardian was advised if they have not already done so to contact the registration department to sign all necessary forms in order for [place] to release information regarding their care.   Consent: Patient/Guardian gives verbal consent for treatment and assignment of benefits for services provided during this visit. Patient/Guardian expressed understanding and agreed to proceed.  This note was generated in part or whole with voice recognition software. Voice recognition is usually quite accurate but there are transcription errors that can and very often do occur. I apologize for any typographical errors that were not detected and corrected.     Jomarie Longs, MD 05/01/2023, 8:06 AM

## 2023-05-05 ENCOUNTER — Other Ambulatory Visit: Payer: Self-pay | Admitting: Psychiatry

## 2023-05-05 DIAGNOSIS — F901 Attention-deficit hyperactivity disorder, predominantly hyperactive type: Secondary | ICD-10-CM | POA: Diagnosis not present

## 2023-05-05 DIAGNOSIS — F331 Major depressive disorder, recurrent, moderate: Secondary | ICD-10-CM | POA: Diagnosis not present

## 2023-05-05 DIAGNOSIS — G4700 Insomnia, unspecified: Secondary | ICD-10-CM

## 2023-05-05 DIAGNOSIS — F411 Generalized anxiety disorder: Secondary | ICD-10-CM

## 2023-05-05 DIAGNOSIS — Z634 Disappearance and death of family member: Secondary | ICD-10-CM

## 2023-05-05 NOTE — Telephone Encounter (Signed)
 According to Dr. Gery Kubas note, clonazepam was ordered for the upcoming cruise, and the database confirms it was filled. Could you please inquire what additional support or medication the patient is requesting? Thanks.

## 2023-05-05 NOTE — Telephone Encounter (Signed)
 Patient scheduled her follow up that was missed. States she called pharmacy and they do not have prescription. Patient states going on a trip Thursday and is why need prescription due to travel. Sent to CMA for review

## 2023-05-06 DIAGNOSIS — M5442 Lumbago with sciatica, left side: Secondary | ICD-10-CM | POA: Diagnosis not present

## 2023-05-06 DIAGNOSIS — M5441 Lumbago with sciatica, right side: Secondary | ICD-10-CM | POA: Diagnosis not present

## 2023-05-06 DIAGNOSIS — M5416 Radiculopathy, lumbar region: Secondary | ICD-10-CM | POA: Diagnosis not present

## 2023-05-06 DIAGNOSIS — M47816 Spondylosis without myelopathy or radiculopathy, lumbar region: Secondary | ICD-10-CM | POA: Diagnosis not present

## 2023-05-06 NOTE — Telephone Encounter (Signed)
 Spoke to patient she stated that she did  not realize that her husband had picked up the prescription already so she is fine.

## 2023-05-06 NOTE — Telephone Encounter (Signed)
 Spoke to patient she did not realize that her husband had already pick up the prescription she is fine

## 2023-05-21 DIAGNOSIS — M5451 Vertebrogenic low back pain: Secondary | ICD-10-CM | POA: Diagnosis not present

## 2023-05-21 DIAGNOSIS — M25551 Pain in right hip: Secondary | ICD-10-CM | POA: Diagnosis not present

## 2023-05-21 DIAGNOSIS — M25552 Pain in left hip: Secondary | ICD-10-CM | POA: Diagnosis not present

## 2023-05-26 DIAGNOSIS — M25551 Pain in right hip: Secondary | ICD-10-CM | POA: Diagnosis not present

## 2023-05-26 DIAGNOSIS — M5451 Vertebrogenic low back pain: Secondary | ICD-10-CM | POA: Diagnosis not present

## 2023-05-26 DIAGNOSIS — M25552 Pain in left hip: Secondary | ICD-10-CM | POA: Diagnosis not present

## 2023-05-27 DIAGNOSIS — F331 Major depressive disorder, recurrent, moderate: Secondary | ICD-10-CM | POA: Diagnosis not present

## 2023-05-27 DIAGNOSIS — F901 Attention-deficit hyperactivity disorder, predominantly hyperactive type: Secondary | ICD-10-CM | POA: Diagnosis not present

## 2023-05-27 DIAGNOSIS — F411 Generalized anxiety disorder: Secondary | ICD-10-CM | POA: Diagnosis not present

## 2023-05-28 DIAGNOSIS — M47816 Spondylosis without myelopathy or radiculopathy, lumbar region: Secondary | ICD-10-CM | POA: Diagnosis not present

## 2023-06-01 DIAGNOSIS — M5451 Vertebrogenic low back pain: Secondary | ICD-10-CM | POA: Diagnosis not present

## 2023-06-01 DIAGNOSIS — M25552 Pain in left hip: Secondary | ICD-10-CM | POA: Diagnosis not present

## 2023-06-01 DIAGNOSIS — M25551 Pain in right hip: Secondary | ICD-10-CM | POA: Diagnosis not present

## 2023-06-04 ENCOUNTER — Telehealth: Payer: Self-pay | Admitting: Family Medicine

## 2023-06-04 NOTE — Telephone Encounter (Signed)
 May 15 at 12:41 am PT LVM stating she needed to speak to provider concern about headache and balance.Need earlier APPT  Call Pt back : Pt called stating that she really need to see Provider. Pt is having really bad pain in her head  and having problem with her balance.. Pt informed that all the pain is on the left side of her head  and is getting this tingling feeling on and off every Hour. Pt doesn't know if its the medication or something else . Pt states if she can not get a different appt . Is it possible to change the medication or up her dosage.

## 2023-06-04 NOTE — Telephone Encounter (Addendum)
 Last saw Kristin [name] 10/22/2022 for migraine/polyneuropathy.   Per last note: "We will continue gabapentin  up to three times daily, topiramate  50mg  daily and Nurtec as needed. Consier supplement below for symptoms management.  Please make sure you are staying well hydrated. I recommend 50-60 ounces daily. Well balanced diet and regular exercise encouraged. Consistent sleep schedule with 6-8 hours recommended.  Please continue follow up with care team as directed.   Follow up with me in 1 year"    Called pt. Balance has worsened since 01/2023. Headaches have been worsening in the last month.  Reports tingling sensation on head. Worse around ear on L side. Feels like spiders are crawling on her head. Has had itching sensation on her head previously but this resolved. Saw dermatologist but told to f/u with neurologist.   Taking gabapentin  300mg , 1 po BID. Nervous about taking TID in fear it will make her sleepy and unable to drive. Scheduled work in visit (ok per NP) for 06/05/23 at 10:30am, check in 10am. Advised she should proceed to ER/urgent care if sx severe for immediate evaluation/treatment. She verbalized understanding and appreciation.

## 2023-06-04 NOTE — Patient Instructions (Incomplete)
 Below is our plan:  We will order MRI for evaluation. Listen for a call to schedule. Seek emergency medical attention for any stroke like symptoms.   Continue topiramate  50mg  twice daily. Increase gabapentin  300mg  to three times daily. Continue PT therapy. Use can at all times. Monitor writing   Please make sure you are staying well hydrated. I recommend 50-60 ounces daily. Well balanced diet and regular exercise encouraged. Consistent sleep schedule with 6-8 hours recommended.   Please continue follow up with care team as directed.   Follow up with me in 6 months   You may receive a survey regarding today's visit. I encourage you to leave honest feed back as I do use this information to improve patient care. Thank you for seeing me today!   GENERAL HEADACHE INFORMATION:   Natural supplements: Magnesium Oxide or Magnesium Glycinate 500 mg at bed (up to 800 mg daily) Coenzyme Q10 300 mg in AM Vitamin B2- 200 mg twice a day   Add 1 supplement at a time since even natural supplements can have undesirable side effects. You can sometimes buy supplements cheaper (especially Coenzyme Q10) at www.WebmailGuide.co.za or at [facility].  Migraine with aura: There is increased risk for stroke in women with migraine with aura and a contraindication for the combined contraceptive pill for use by women who have migraine with aura. The risk for women with migraine without aura is lower. However other risk factors like smoking are far more likely to increase stroke risk than migraine. There is a recommendation for no smoking and for the use of OCPs without estrogen such as progestogen only pills particularly for women with migraine with aura.Kristin [name] People who have migraine headaches with auras may be 3 times more likely to have a stroke caused by a blood clot, compared to migraine patients who don't see auras. Women who take hormone-replacement therapy may be 30 percent more likely to suffer a clot-based stroke than women not  taking medication containing estrogen. Other risk factors like smoking and high blood pressure may be  much more important.    Vitamins and herbs that show potential:   Magnesium: Magnesium (250 mg twice a day or 500 mg at bed) has a relaxant effect on smooth muscles such as blood vessels. Individuals suffering from frequent or daily headache usually have low magnesium levels which can be increase with daily supplementation of 400-750 mg. Three trials found 40-90% average headache reduction  when used as a preventative. Magnesium may help with headaches are aura, the best evidence for magnesium is for migraine with aura is its thought to stop the cortical spreading depression we believe is the pathophysiology of migraine aura.Magnesium also demonstrated the benefit in menstrually related migraine.  Magnesium is part of the messenger system in the serotonin cascade and it is a good muscle relaxant.  It is also useful for constipation which can be a side effect of other medications used to treat migraine. Good sources include nuts, whole grains, and tomatoes. Side Effects: loose stool/diarrhea  Riboflavin (vitamin B 2) 200 mg twice a day. This vitamin assists nerve cells in the production of ATP a principal energy storing molecule.  It is necessary for many chemical reactions in the body.  There have been at least 3 clinical trials of riboflavin using 400 mg per day all of which suggested that migraine frequency can be decreased.  All 3 trials showed significant improvement in over half of migraine sufferers.  The supplement is found in bread, cereal,  milk, meat, and poultry.  Most Americans get more riboflavin than the recommended daily allowance, however riboflavin deficiency is not necessary for the supplements to help prevent headache. Side effects: energizing, green urine   Coenzyme Q10: This is present in almost all cells in the body and is critical component for the conversion of energy.  Recent studies  have shown that a nutritional supplement of CoQ10 can reduce the frequency of migraine attacks by improving the energy production of cells as with riboflavin.  Doses of 150 mg twice a day have been shown to be effective.   Melatonin: Increasing evidence shows correlation between melatonin secretion and headache conditions.  Melatonin supplementation has decreased headache intensity and duration.  It is widely used as a sleep aid.  Sleep is natures way of dealing with migraine.  A dose of 3 mg is recommended to start for headaches including cluster headache. Higher doses up to 15 mg has been reviewed for use in Cluster headache and have been used. The rationale behind using melatonin for cluster is that many theories regarding the cause of Cluster headache center around the disruption of the normal circadian rhythm in the brain.  This helps restore the normal circadian rhythm.   HEADACHE DIET: Foods and beverages which may trigger migraine Note that only 20% of headache patients are food sensitive. You will know if you are food sensitive if you get a headache consistently 20 minutes to 2 hours after eating a certain food. Only cut out a food if it causes headaches, otherwise you might remove foods you enjoy! What matters most for diet is to eat a well balanced healthy diet full of vegetables and low fat protein, and to not miss meals.   Chocolate, other sweets ALL cheeses except cottage and cream cheese Dairy products, yogurt, sour cream, ice cream Liver Meat extracts (Bovril, Marmite, meat tenderizers) Meats or fish which have undergone aging, fermenting, pickling or smoking. These include: Hotdogs,salami,Lox,sausage, mortadellas,smoked salmon, pepperoni, Pickled herring Pods of broad bean (English beans, Chinese pea pods, [place] (fava) beans, lima and navy beans Ripe avocado, ripe banana Yeast extracts or active yeast preparations such as Brewer's or Fleishman's (commercial bakes goods are  permitted) Tomato based foods, pizza (lasagna, etc.)   MSG (monosodium glutamate) is disguised as many things; look for these common aliases: Monopotassium glutamate Autolysed yeast Hydrolysed protein Sodium caseinate "flavorings" "all natural preservatives" Nutrasweet   Avoid all other foods that convincingly provoke headaches.   Resources: The Dizzy Althia Jetty Your Headache Diet, migrainestrong.com  https://zamora-andrews.com/   Caffeine and Migraine For patients that have migraine, caffeine intake more than 3 days per week can lead to dependency and increased migraine frequency. I would recommend cutting back on your caffeine intake as best you can. The recommended amount of caffeine is 200-300 mg daily, although migraine patients may experience dependency at even lower doses. While you may notice an increase in headache temporarily, cutting back will be helpful for headaches in the long run. For more information on caffeine and migraine, visit: https://americanmigrainefoundation.org/resource-library/caffeine-and-migraine/   Headache Prevention Strategies:   1. Maintain a headache diary; learn to identify and avoid triggers.  - This can be a simple note where you log when you had a headache, associated symptoms, and medications used - There are several smartphone apps developed to help track migraines: Migraine Buddy, Migraine Monitor, Curelator N1-Headache App   Common triggers include: Emotional triggers: Emotional/Upset family or friends Emotional/Upset occupation Business reversal/success Anticipation anxiety Crisis-serious Post-crisis periodNew job/position  Physical triggers: Vacation Day Weekend Strenuous Exercise High Altitude Location New Move Menstrual Day Physical Illness Oversleep/Not enough sleep Weather changes Light: Photophobia or light sesnitivity treatment involves a balance between desensitization and  reduction in overly strong input. Use dark polarized glasses outside, but not inside. Avoid bright or fluorescent light, but do not dim environment to the point that going into a normally lit room hurts. Consider FL-41 tint lenses, which reduce the most irritating wavelengths without blocking too much light.  These can be obtained at axonoptics.com or theraspecs.com Foods: see list above.   2. Limit use of acute treatments (over-the-counter medications, triptans, etc.) to no more than 2 days per week or 10 days per month to prevent medication overuse headache (rebound headache).     3. Follow a regular schedule (including weekends and holidays): Don't skip meals. Eat a balanced diet. 8 hours of sleep nightly. Minimize stress. Exercise 30 minutes per day. Being overweight is associated with a 5 times increased risk of chronic migraine. Keep well hydrated and drink 6-8 glasses of water  per day.   4. Initiate non-pharmacologic measures at the earliest onset of your headache. Rest and quiet environment. Relax and reduce stress. Breathe2Relax is a free app that can instruct you on    some simple relaxtion and breathing techniques. Http://Dawnbuse.com is a    free website that provides teaching videos on relaxation.  Also, there are  many apps that   can be downloaded for "mindful" relaxation.  An app called YOGA NIDRA will help walk you through mindfulness. Another app called Calm can be downloaded to give you a structured mindfulness guide with daily reminders and skill development. Headspace for guided meditation Mindfulness Based Stress Reduction Online Course: www.palousemindfulness.com Cold compresses.   5. Don't wait!! Take the maximum allowable dosage of prescribed medication at the first sign of migraine.   6. Compliance:  Take prescribed medication regularly as directed and at the first sign of a migraine.   7. Communicate:  Call your physician when problems arise, especially if your  headaches change, increase in frequency/severity, or become associated with neurological symptoms (weakness, numbness, slurred speech, etc.). Proceed to emergency room if you experience new or worsening symptoms or symptoms do not resolve, if you have new neurologic symptoms or if headache is severe, or for any concerning symptom.   8. Headache/pain management therapies: Consider various complementary methods, including medication, behavioral therapy, psychological counselling, biofeedback, massage therapy, acupuncture, dry needling, and other modalities.  Such measures may reduce the need for medications. Counseling for pain management, where patients learn to function and ignore/minimize their pain, seems to work very well.   9. Recommend changing family's attention and focus away from patient's headaches. Instead, emphasize daily activities. If first question of day is 'How are your headaches/Do you have a headache today?', then patient will constantly think about headaches, thus making them worse. Goal is to re-direct attention away from headaches, toward daily activities and other distractions.   10. Helpful Websites: www.AmericanHeadacheSociety.org PatentHood.ch www.headaches.org TightMarket.nl www.achenet.org

## 2023-06-04 NOTE — Progress Notes (Unsigned)
 PATIENT: Kristin [name] DOB: Nov 23, 1960  REASON FOR VISIT: follow up HISTORY FROM: patient  No chief complaint on file.    HISTORY OF PRESENT ILLNESS:  06/04/23 ALL: Kristin [name] returns for follow up. She was last seen 10/2022 and reported worsening migraines. We opted to focus on healthy lifestyle habits and follow up if no improvement. She called yesterday reporting worsening headaches over the past month. She describes pain as a tingling sensation of head, worse around left ear. Like spiders are crawling on her head. Saw dermatology but referred back to us . No rash?   She has continued topiramate  50mg  daily and gabapentin  300mg  BID. Neuropathy is .Aaron Aas She reports concerns of sleepiness with third dose of gabapentin .   11/13/2022 ALL: Kristin [name] returns for follow up for migraines and diabetic neuropathy. She was last seen 02/2022. We continued gabapentin  and Nurtec and restarted topiramate  50mg  daily. Since, she reports symptoms have gotten worse over the past few months. She lost her mom 09/2022. She had been sick over the past year but worsened over the summer. Migraines worsened during this period. She averages about 3-4 migraines per month, previously down to 1-2 every few months. She continues topiramate  50mg  daily. Nurtec works well for abortive therapy.   She is having more numbness of both feet. She gets nervous driving as she is concerned she doesn't feel the pedals as she used to. Last A1C 5.2. She continues to have muscle cramps at random times. Gabapentin  helps. Prescribed 300mg  TID but usually takes 1-2 times daily.  03/05/2022 ALL: Kristin [name] returns for follow up for migraines and diabetic neuropathy. She was last seen 01/2021 and doing failry well. She reports discontinuing topiramate  at some point over the past year. PCP also stopped amitriptyline  due to dizziness over the summer of 2023.She has continued Nurtec PRN. She reports daily tension style headaches have worsened. She has  about 2 migraines per month.   Gabapentin  300mg , usually just twice daily. She feels pain was significantly improved for a while but has gotten a little worse over the past two months. She reports some tingling but no pins and needles. Muscle cramps are much better.  She feels numbness is worse. She is having trouble with balance. She feels that she is falling forward at times. She has had two falls. Fortunately, no significant injuries. She is not using an assistive device.   She has had two surgeries on the right shoulder over the past year. She just finished PT.   02/07/2021 ALL: Kristin [name] returns for follow up for migraines and diabetic polyneuropathy. She continues topiramate  50mg  BID and Nurtec as needed. Also on amitriptyline  25mg  at bedtime for sleep managed by psychiatry.   She feels that she is doing ok. Headaches are usually well managed. She did have a migraine that lasted about 2 days around Christmas and she contributes to stress. She thinks it was about 2-3 months since she had to use abortive meds.   Dr Tresia Fruit started gabapentin  300mg  TID and advised she consider alpha lipoic acid as well as lidocaine  and capsaicin OTC. She has only taken gabapentin  at night due to sleepiness. She did not try OTC meds. She does continue to have numbness/tingling of both hands and feet. She feels that right hand may be a little weaker than left. Has trouble holding glass or coffee cups. Does well with plastic cups. She also reports new numbness of posterior right thigh. Worse when sitting. She has noticed for a couple of weeks. No  lower extremity weakness. She does have intermittent low back pain.   She has lost 20 pounds since 07/2020, 50lbs over the past year. A1C now 6.2. She is taking Mounjaro  and tolerating well.    08/07/2020 AA: She has very poor balance, she feels like she may keep going forward when stands, when she walks down the grocery isle she feels like she has to hold onto her husband. Pins and  needles, cramping in the feet, she can be sitting, walking, in bed it is more hte cramps int he back of the legs and the arch of the foot, K+ pills helped with the cramps. Slowly progressive. Ongoing for over a year.    HgbA1c 6/22 7.4  04/05/20 8.3, 03/2020 B12 383, 03/2020 tsh 2.49   Interval history 11/10/2017: MR of the brain showed frontal chronic sinusitis, discussed seeing ENT. Propranolol  is helping, the headaches are not as bad. She has been tested in the past recently 3 years ago and no sleep apnea.  She is under more stress now. Her pulse is 60 cannot increase propranolol  further. Discussed options, due to stress may start Prozac  which is a migraine preventative and can help with stress. Zofran  for dizziness or nausea. Also start Emgality .    Until this past week has been doing "great" with the migraines but this one has on ongoing going for days will give you a torado Toradol  l injection at that . tried: propranolol , topiramate , prozac , nortriptyline , verapamil   REVIEW OF SYSTEMS: Out of a complete 14 system review of symptoms, the patient complains only of the following symptoms, headaches, anxiety, low back pain, numbness/tingling, insomnia and all other reviewed systems are negative.   ALLERGIES: Allergies  Allergen Reactions   Tetracyclines & Related Swelling and Rash    FACE SWELL, RASH ON CHEST   Amoxicillin     Alcohol Rash    ABDOMINAL CRAMPS, drinking alcohol   Azithromycin  Rash   Metoprolol  Diarrhea    HOME MEDICATIONS: Outpatient Medications Prior to Visit  Medication Sig Dispense Refill   buPROPion  (WELLBUTRIN ) 75 MG tablet Take 1 tablet (75 mg total) by mouth in the morning. 90 tablet 1   citalopram  (CELEXA ) 40 MG tablet Take 1 tablet (40 mg total) by mouth daily. 90 tablet 1   clonazePAM  (KLONOPIN ) 0.5 MG tablet Take 0.5-1 tablets (0.25-0.5 mg total) by mouth daily as needed for anxiety. Limited supply 10 tablet 0   fexofenadine  (ALLEGRA ) 180 MG  tablet Take 1 tablet (180 mg total) by mouth daily as needed for allergies or rhinitis. 90 tablet 3   gabapentin  (NEURONTIN ) 300 MG capsule TAKE 1 CAPSULE BY MOUTH 3 TIMES DAILY AS NEEDED. (Patient taking differently: Take 300 mg by mouth 2 (two) times daily. TAKE 1 CAPSULE BY MOUTH 2 TIMES DAILY AS NEEDED.) 90 capsule 5   hydrOXYzine  (ATARAX ) 25 MG tablet TAKE 0.5-1 TABLETS (12.5-25 MG TOTAL) BY MOUTH 3 (THREE) TIMES DAILY AS NEEDED FOR ANXIETY (AND SLEEP). 270 tablet 1   montelukast  (SINGULAIR ) 10 MG tablet TAKE 1 TABLET BY MOUTH EVERYDAY AT BEDTIME 90 tablet 3   omeprazole  (PRILOSEC) 40 MG capsule Take 1 capsule (40 mg total) by mouth daily. 90 capsule 3   ondansetron  (ZOFRAN -ODT) 4 MG disintegrating tablet Take 1 tablet (4 mg total) by mouth every 8 (eight) hours as needed. for nausea 20 tablet 2   Rimegepant Sulfate (NURTEC) 75 MG TBDP Take 1 tablet (75 mg total) by mouth daily as needed (take for abortive therapy of migraine,  no more than 1 tablet in 24 hours or 10 per month). 10 tablet 2   rosuvastatin  (CRESTOR ) 20 MG tablet Take 1 tablet (20 mg total) by mouth daily. 90 tablet 2   tirzepatide  (MOUNJARO ) 2.5 MG/0.5ML Pen INJECT 2.5 MG SUBCUTANEOUSLY WEEKLY 6 mL 1   topiramate  (TOPAMAX ) 50 MG tablet Take 1 tablet (50 mg total) by mouth daily. 90 tablet 3   zolpidem  (AMBIEN  CR) 6.25 MG CR tablet TAKE 2 TABLETS (12.5 MG TOTAL) BY MOUTH AT BEDTIME AS NEEDED FOR SLEEP. 60 tablet 3   No facility-administered medications prior to visit.    PAST MEDICAL HISTORY: Past Medical History:  Diagnosis Date   ADHD    Adhesive capsulitis of right shoulder 07/23/2020   Allergy     Annual physical exam 08/19/2019   Anxiety 12/12/2015   Arthritis    Basal cell carcinoma    nose, left cheek, right upper lip, right posterior shoulder most recent 6 years ago on left cheek   Basal cell carcinoma 03/26/2021   L chest, EDC 04/24/2021   Benign essential hypertension 08/14/2014   Bleeding    Bursitis of right  shoulder 12/25/2020   Chicken pox    Chronic diarrhea    Chronic intractable headache 10/20/2017   Chronic migraine w/o aura, not intractable, w/o stat migr 08/08/2020   Complication of anesthesia    blood pressure elevated after gastric sleeve surgery in recovery, pt does not want mask over face; migraines   COVID-19    03/01/20 had MAB 03/03/20 and felt better next day 03/04/20   CTS (carpal tunnel syndrome) 04/05/2020   Dr. Mason Sole s/p [facility]/Lidocaine  injection 12/05/19   Depression    Diabetes mellitus without complication (HCC)    pre-diabetic currently, was diabetic prior to gastric sleeve surgery    Diabetic polyneuropathy associated with diabetes mellitus due to underlying condition (HCC) 08/08/2020   Disorder of right rotator cuff    10/03/19 MRI right shoulder abnormal Dr. Tonya Fredrickson   Fall    08/2020   Fall    03/28/21   Fatty liver    Fatty liver 05/06/2017   US  + 2013    Gout    Hematuria 10/15/2018   History of shingles 06/19/2021   lower back and R side of thigh   Hives 03/28/2020   Hyperlipidemia    Hypertension    no problems since losing weight after gastric sleeve surgery   Hypertension 07/17/2014   IBS (irritable bowel syndrome)    Injury of right wrist 06/26/2016   Insomnia    Migraine    Neuropathy    bilateral feet   Obesity    Obesity (BMI 30-39.9) 05/06/2017   Obesity, diabetes, and hypertension syndrome (HCC) 08/18/2019   Osteopenia    Palpitations 08/14/2014   PONV (postoperative nausea and vomiting)    Squamous cell carcinoma of skin 03/26/2021   SCC IS R mid sternum, EDC 04/24/2021   Tendonitis of shoulder, right 12/25/2020    PAST SURGICAL HISTORY: Past Surgical History:  Procedure Laterality Date   ABDOMINAL HYSTERECTOMY     no h/o abnormal pap still has 1 ovary left   ANKLE ARTHROSCOPY Left 05/29/2016   Procedure: ANKLE ARTHROSCOPY;  Surgeon: Dot Gazella, DPM;  Location: [facility] OR;  Service: Podiatry;  Laterality: Left;   APPENDECTOMY      basal cell removed     CESAREAN SECTION N/A    1985 & 1995   CHOLECYSTECTOMY     CLOSED MANIPULATION SHOULDER WITH  STERIOD INJECTION Right 03/31/2022   Procedure: MANIPULATION UNDER ANESTHESIA WITH GLENOHUMERAL AND SUBACROMIAL CORTICOSTERIOD INJECTION;  Surgeon: Lorri Rota, MD;  Location: [facility] ORS;  Service: Orthopedics;  Laterality: Right;   COLONOSCOPY WITH PROPOFOL  N/A 01/31/2019   Procedure: COLONOSCOPY WITH PROPOFOL ;  Surgeon: Selena Daily, MD;  Location: [facility] ENDOSCOPY;  Service: Gastroenterology;  Laterality: N/A;   cyst removed from wrist     ESOPHAGOGASTRODUODENOSCOPY (EGD) WITH PROPOFOL  N/A 01/31/2019   Procedure: ESOPHAGOGASTRODUODENOSCOPY (EGD) WITH PROPOFOL ;  Surgeon: Selena Daily, MD;  Location: [facility] ENDOSCOPY;  Service: Gastroenterology;  Laterality: N/A;   ESOPHAGOGASTRODUODENOSCOPY (EGD) WITH PROPOFOL  N/A 05/20/2022   Procedure: ESOPHAGOGASTRODUODENOSCOPY (EGD) WITH PROPOFOL ;  Surgeon: Selena Daily, MD;  Location: [facility] ENDOSCOPY;  Service: Gastroenterology;  Laterality: N/A;   ESOPHAGOGASTRODUODENOSCOPY (EGD) WITH PROPOFOL  N/A 09/24/2022   Procedure: ESOPHAGOGASTRODUODENOSCOPY (EGD) WITH PROPOFOL ;  Surgeon: Selena Daily, MD;  Location: [facility] ENDOSCOPY;  Service: Gastroenterology;  Laterality: N/A;   FLEXIBLE SIGMOIDOSCOPY N/A 05/21/2020   Procedure: FLEXIBLE SIGMOIDOSCOPY;  Surgeon: Selena Daily, MD;  Location: [facility] ENDOSCOPY;  Service: Gastroenterology;  Laterality: N/A;   FOOT SURGERY     left foot 11/2016 Dr. Santa Cuba    left elbow surgery     07/2017 Dr Josephus Nida epicondylitis    LYSIS OF ADHESION Right 08/27/2021   Procedure: Right shoulder arthroscopic lysis of adhesions, capsular release, and manipulation under anesthesia with corticosteroid injection and biceps tenodesis;  Surgeon: Lorri Rota, MD;  Location: [facility];  Service: Orthopedics;  Laterality: Right;   MOHS SURGERY     x 2 face nose and left cheek     OSTEOCHONDROMA EXCISION Left 05/29/2016   Procedure: OSTEOCHONDRAL DRILLING TALUS;  Surgeon: Dot Gazella, DPM;  Location: [facility] OR;  Service: Podiatry;  Laterality: Left;   PLANTAR FASCIA RELEASE Left 05/29/2016   Procedure: ENDOSCOPIC PLANTAR FASCIOTOMY;  Surgeon: Dot Gazella, DPM;  Location: [facility] OR;  Service: Podiatry;  Laterality: Left;   ROTATOR CUFF REPAIR Left    SHOULDER ARTHROSCOPY WITH BICEPSTENOTOMY Right 03/31/2022   Procedure: SHOULDER ARTHROSCOPY WITH BICEPS TENOTOMY, ARTHROSCOPIC LYSIS OF ADHESIONS, AND CAPSULAR RELEASE, AND EXTENSIVE GLENOHUMERAL DEBRIDEMENT;  Surgeon: Lorri Rota, MD;  Location: [facility] ORS;  Service: Orthopedics;  Laterality: Right;   SHOULDER ARTHROSCOPY WITH ROTATOR CUFF REPAIR  03/31/2022   Procedure: SHOULDER ARTHROSCOPY WITH ROTATOR CUFF REPAIR;  Surgeon: Lorri Rota, MD;  Location: [facility] ORS;  Service: Orthopedics;;   STOMACH SURGERY  2017   gastric sleeve surgery   TENNIS ELBOW RELEASE/NIRSCHEL PROCEDURE Left 08/05/2017   Procedure: TENNIS ELBOW RELEASE/NIRSCHEL PROCEDURE;  Surgeon: Josephus Nida, MD;  Location: [facility] ORS;  Service: Orthopedics;  Laterality: Left;   TUBAL LIGATION     wedge resection of ovary  1982    FAMILY HISTORY: Family History  Problem Relation Age of Onset   Heart disease Mother    Breast cancer Mother 34       metastatic, now hip, spine, and leg    Arthritis Mother    Hyperlipidemia Mother    Cancer Mother        spinal surgery. breast dx'ed age 40 now metastatic 54 as of 07/13/18   Cystic fibrosis Mother        carrier    Heart disease Father    Lung cancer Father        smoker   Hyperlipidemia Father    Hypertension Father    Depression Sister    Diabetes Sister    Stroke Sister  Other Sister        covid +   Diabetes Sister    Diabetes Maternal Aunt    Alcohol abuse Maternal Aunt    Diabetes Maternal Aunt    Alcohol abuse Maternal Uncle    Diabetes Maternal Uncle    Alcohol abuse Paternal Uncle     Diabetes Maternal Grandfather    Hyperlipidemia Maternal Grandfather    Arthritis Maternal Grandmother    Sudden death Cousin    Schizophrenia Cousin    Cystic fibrosis Other        great niece    Factor V Leiden deficiency Other        uncle and cousins and m GF (all maternal)   Alcohol abuse Niece    Alcohol abuse Nephew    ADD / ADHD Son    Depression Son    ADD / ADHD Son    Post-traumatic stress disorder Son    Colon cancer Neg Hx    Ovarian cancer Neg Hx     SOCIAL HISTORY: Social History   Socioeconomic History   Marital status: Married    Spouse name: Not on file   Number of children: 3   Years of education: Not on file   Highest education level: Some college, no degree  Occupational History   Not on file  Tobacco Use   Smoking status: Never   Smokeless tobacco: Never  Vaping Use   Vaping status: Never Used  Substance and Sexual Activity   Alcohol use: Never   Drug use: Never   Sexual activity: Yes    Partners: Male    Birth control/protection: Surgical  Other Topics Concern   Not on file  Social History Narrative   Married x 37 years as of 10/2017    Kids 3 boys youngest is Donavon Fudge   Write childrens books    Retired from [employer] 01/2017    Enjoys traveling    Right handed   Caffeine: quit 2009   Social Drivers of [employer] Strain: Low Risk  (04/14/2023)   Received from [facility]   Overall Financial Resource Strain (CARDIA)    Difficulty of Paying Living Expenses: Not very hard  Food Insecurity: No Food Insecurity (04/14/2023)   Received from [facility]   Hunger Vital Sign    Worried About Running Out of Food in the Last Year: Never true    Ran Out of Food in the Last Year: Never true  Transportation Needs: No Transportation Needs (04/14/2023)   Received from [facility] - Transportation    In the past 12 months, has lack of transportation kept you from medical  appointments or from getting medications?: No    Lack of Transportation (Non-Medical): No  Physical Activity: Inactive (01/01/2023)   Exercise Vital Sign    Days of Exercise per Week: 0 days    Minutes of Exercise per Session: 20 min  Stress: Stress Concern Present (01/01/2023)   [employer] of Occupational Health - Occupational Stress Questionnaire    Feeling of Stress : Very much  Social Connections: Moderately Isolated (01/01/2023)   Social Connection and Isolation Panel [NHANES]    Frequency of Communication with Friends and Family: Three times a week    Frequency of Social Gatherings with Friends and Family: Patient declined    Attends Religious Services: Never    Database administrator or Organizations: No    Attends Club or  Organization Meetings: Not on file    Marital Status: Married  Catering manager Violence: Not on file      PHYSICAL EXAM  There were no vitals filed for this visit.     There is no height or weight on file to calculate BMI.  Generalized: Well developed, in no acute distress  Cardiology: normal rate and rhythm, no murmur noted Respiratory: Clear to auscultation bilaterally  Neurological examination  Mentation: Alert oriented to time, place, history taking. Follows all commands speech and language fluent Cranial nerve II-XII: Pupils were equal round reactive to light. Extraocular movements were full, visual field were full on confrontational test. Facial sensation and strength were normal. Head turning and shoulder shrug  were normal and symmetric. Motor: The motor testing reveals 5 over 5 strength of all 4 extremities. Grip strength is normal. Good symmetric motor tone is noted throughout.  Sensory: Sensory testing is intact to soft touch, pinprick and vibration on all 4 extremities with exception of decreased vibratory sensation of left lateral malleolus (prior ankle surgery) No evidence of extinction is noted.  Coordination: Cerebellar testing  reveals good finger-nose-finger and heel-to-shin bilaterally.  Gait and station: Gait is normal.   DIAGNOSTIC DATA (LABS, IMAGING, TESTING) - I reviewed patient records, labs, notes, testing and imaging myself where available.      No data to display           Lab Results  Component Value Date   WBC 5.8 03/16/2023   HGB 13.4 03/16/2023   HCT 40.6 03/16/2023   MCV 91.5 03/16/2023   PLT 217.0 03/16/2023      Component Value Date/Time   NA 141 03/16/2023 0952   NA 141 05/05/2019 0838   NA 139 10/24/2012 1218   K 4.4 03/16/2023 0952   K 3.9 10/24/2012 1218   CL 107 03/16/2023 0952   CL 103 10/24/2012 1218   CO2 27 03/16/2023 0952   CO2 29 10/24/2012 1218   GLUCOSE 90 03/16/2023 0952   GLUCOSE 103 (H) 10/24/2012 1218   BUN 19 03/16/2023 0952   BUN 11 05/05/2019 0838   BUN 12 10/24/2012 1218   CREATININE 0.81 03/16/2023 0952   CREATININE 0.67 10/24/2012 1218   CALCIUM  9.3 03/16/2023 0952   CALCIUM  9.0 10/24/2012 1218   PROT 6.5 03/16/2023 0952   PROT 7.4 10/15/2020 1356   PROT 7.4 10/24/2012 1218   ALBUMIN 4.3 03/16/2023 0952   ALBUMIN 4.7 10/15/2020 1356   ALBUMIN 3.2 (L) 10/24/2012 1218   AST 23 03/16/2023 0952   AST 14 (L) 10/24/2012 1218   ALT 25 03/16/2023 0952   ALT 22 10/24/2012 1218   ALKPHOS 61 03/16/2023 0952   ALKPHOS 99 10/24/2012 1218   BILITOT 0.4 03/16/2023 0952   BILITOT 0.3 10/15/2020 1356   BILITOT 0.2 10/24/2012 1218   GFRNONAA >60 08/18/2022 1728   GFRNONAA >60 10/24/2012 1218   GFRAA 110 05/05/2019 0838   GFRAA >60 10/24/2012 1218   Lab Results  Component Value Date   CHOL 151 07/03/2022   HDL 59.20 07/03/2022   LDLCALC 77 07/03/2022   LDLDIRECT 48.0 11/08/2020   TRIG 70.0 07/03/2022   CHOLHDL 3 07/03/2022   Lab Results  Component Value Date   HGBA1C 5.5 03/16/2023   Lab Results  Component Value Date   VITAMINB12 419 07/03/2022   Lab Results  Component Value Date   TSH 3.58 03/16/2023       ASSESSMENT AND PLAN 63  y.o. year old female  has a past medical history of ADHD, Adhesive capsulitis of right shoulder (07/23/2020), Allergy , Annual physical exam (08/19/2019), Anxiety (12/12/2015), Arthritis, Basal cell carcinoma, Basal cell carcinoma (03/26/2021), Benign essential hypertension (08/14/2014), Bleeding, Bursitis of right shoulder (12/25/2020), Chicken pox, Chronic diarrhea, Chronic intractable headache (10/20/2017), Chronic migraine w/o aura, not intractable, w/o stat migr (08/08/2020), Complication of anesthesia, COVID-19, CTS (carpal tunnel syndrome) (04/05/2020), Depression, Diabetes mellitus without complication (HCC), Diabetic polyneuropathy associated with diabetes mellitus due to underlying condition (HCC) (08/08/2020), Disorder of right rotator cuff, Fall, Fall, Fatty liver, Fatty liver (05/06/2017), Gout, Hematuria (10/15/2018), History of shingles (06/19/2021), Hives (03/28/2020), Hyperlipidemia, Hypertension, Hypertension (07/17/2014), IBS (irritable bowel syndrome), Injury of right wrist (06/26/2016), Insomnia, Migraine, Neuropathy, Obesity, Obesity (BMI 30-39.9) (05/06/2017), Obesity, diabetes, and hypertension syndrome (HCC) (08/18/2019), Osteopenia, Palpitations (08/14/2014), PONV (postoperative nausea and vomiting), Squamous cell carcinoma of skin (03/26/2021), and Tendonitis of shoulder, right (12/25/2020). here with   No diagnosis found.     Kristin [name] reports that headaches have worsened. We will continue topiramate  50mg  daily for prevention and Nurtec as needed for abortive therapy. She will continue gabapentin  300mg  up to three times daily for DM neuropathy. Alpha lipoic acid, lidocaine  and capsaicin could also provide relief if needed. Consider PT for gait/balance training. She will work on adequate hydration, well balanced diet and regular exercise. She will return for follow up in 1 year. She verbalizes understanding and agreement with this plan.    No orders of the defined types were placed in  this encounter.    No orders of the defined types were placed in this encounter.     Terrilyn Fick, FNP-C 06/04/2023, 4:16 PM [facility] [address], [address] [place], [place] [address] [phone]

## 2023-06-04 NOTE — Telephone Encounter (Deleted)
 Pt called stating that she really need to see Provider. Pt is having really bad pain in her head  and having problem with her balance.. Pt informed that all the pain is on the left side of her head  and is getting this tingling feeling on and off every Hour. Pt doesn't know if its the medication or something else . Pt states if she can not get a different appt . Is it possible to change the medication or up her dosage.

## 2023-06-05 ENCOUNTER — Encounter: Payer: Self-pay | Admitting: Family Medicine

## 2023-06-05 ENCOUNTER — Ambulatory Visit: Admitting: Family Medicine

## 2023-06-05 VITALS — BP 113/64 | HR 73 | Ht 62.0 in | Wt 128.0 lb

## 2023-06-05 DIAGNOSIS — R202 Paresthesia of skin: Secondary | ICD-10-CM

## 2023-06-05 DIAGNOSIS — G4452 New daily persistent headache (NDPH): Secondary | ICD-10-CM

## 2023-06-05 DIAGNOSIS — G43709 Chronic migraine without aura, not intractable, without status migrainosus: Secondary | ICD-10-CM

## 2023-06-05 DIAGNOSIS — E0842 Diabetes mellitus due to underlying condition with diabetic polyneuropathy: Secondary | ICD-10-CM | POA: Diagnosis not present

## 2023-06-05 MED ORDER — GABAPENTIN 300 MG PO CAPS: ORAL_CAPSULE | ORAL | 1 refills | Status: DC

## 2023-06-08 DIAGNOSIS — M25551 Pain in right hip: Secondary | ICD-10-CM | POA: Diagnosis not present

## 2023-06-08 DIAGNOSIS — M25552 Pain in left hip: Secondary | ICD-10-CM | POA: Diagnosis not present

## 2023-06-08 DIAGNOSIS — M5451 Vertebrogenic low back pain: Secondary | ICD-10-CM | POA: Diagnosis not present

## 2023-06-10 ENCOUNTER — Ambulatory Visit

## 2023-06-10 DIAGNOSIS — G43709 Chronic migraine without aura, not intractable, without status migrainosus: Secondary | ICD-10-CM

## 2023-06-10 DIAGNOSIS — G4452 New daily persistent headache (NDPH): Secondary | ICD-10-CM

## 2023-06-10 DIAGNOSIS — R202 Paresthesia of skin: Secondary | ICD-10-CM

## 2023-06-10 MED ORDER — GADOBENATE DIMEGLUMINE 529 MG/ML IV SOLN
10.0000 mL | Freq: Once | INTRAVENOUS | Status: AC | PRN
  Administered 2023-06-10: 10 mL via INTRAVENOUS

## 2023-06-12 DIAGNOSIS — M5416 Radiculopathy, lumbar region: Secondary | ICD-10-CM | POA: Diagnosis not present

## 2023-06-12 DIAGNOSIS — M47816 Spondylosis without myelopathy or radiculopathy, lumbar region: Secondary | ICD-10-CM | POA: Diagnosis not present

## 2023-06-12 DIAGNOSIS — M5442 Lumbago with sciatica, left side: Secondary | ICD-10-CM | POA: Diagnosis not present

## 2023-06-12 DIAGNOSIS — M5441 Lumbago with sciatica, right side: Secondary | ICD-10-CM | POA: Diagnosis not present

## 2023-06-16 ENCOUNTER — Ambulatory Visit: Payer: Self-pay | Admitting: Family Medicine

## 2023-06-17 DIAGNOSIS — M25551 Pain in right hip: Secondary | ICD-10-CM | POA: Diagnosis not present

## 2023-06-17 DIAGNOSIS — M5451 Vertebrogenic low back pain: Secondary | ICD-10-CM | POA: Diagnosis not present

## 2023-06-17 DIAGNOSIS — M25552 Pain in left hip: Secondary | ICD-10-CM | POA: Diagnosis not present

## 2023-06-23 DIAGNOSIS — F331 Major depressive disorder, recurrent, moderate: Secondary | ICD-10-CM | POA: Diagnosis not present

## 2023-06-23 DIAGNOSIS — F901 Attention-deficit hyperactivity disorder, predominantly hyperactive type: Secondary | ICD-10-CM | POA: Diagnosis not present

## 2023-06-23 DIAGNOSIS — F411 Generalized anxiety disorder: Secondary | ICD-10-CM | POA: Diagnosis not present

## 2023-06-24 DIAGNOSIS — M25552 Pain in left hip: Secondary | ICD-10-CM | POA: Diagnosis not present

## 2023-06-24 DIAGNOSIS — M5451 Vertebrogenic low back pain: Secondary | ICD-10-CM | POA: Diagnosis not present

## 2023-06-24 DIAGNOSIS — M25551 Pain in right hip: Secondary | ICD-10-CM | POA: Diagnosis not present

## 2023-06-25 DIAGNOSIS — M47816 Spondylosis without myelopathy or radiculopathy, lumbar region: Secondary | ICD-10-CM | POA: Diagnosis not present

## 2023-06-29 ENCOUNTER — Ambulatory Visit: Payer: Self-pay

## 2023-06-29 NOTE — Patient Instructions (Incomplete)
 It was a pleasure meeting you today. Thank you for allowing me to take part in your health care.  Our goals for today as we discussed include:  Wound does not look infected. Continue current management  If develops any fevers, increasing redness or pain please notify MD  Refills sent for requested medications   This is a list of the screening recommended for you and due dates:  Health Maintenance  Topic Date Due   Pneumococcal Vaccination (2 of 2 - PPSV23) 02/19/2021   COVID-19 Vaccine (7 - 2024-25 season) 09/21/2022   DTaP/Tdap/Td vaccine (3 - Td or Tdap) 01/09/2023   Eye exam for diabetics  08/04/2023   Flu Shot  08/21/2023   Hemoglobin A1C  09/13/2023   Yearly kidney health urinalysis for diabetes  01/01/2024   Yearly kidney function blood test for diabetes  03/15/2024   Pap with HPV screening  08/18/2024   Mammogram  10/26/2024   Colon Cancer Screening  01/30/2029   Hepatitis C Screening  Completed   HIV Screening  Completed   Zoster (Shingles) Vaccine  Completed   HPV Vaccine  Aged Out   Meningitis B Vaccine  Aged Out   Complete foot exam   Discontinued      If you have any questions or concerns, please do not hesitate to call the office at [phone].  I look forward to our next visit and until then take care and stay safe.  Regards,   Valli Gaw, MD   [facility]

## 2023-06-29 NOTE — Telephone Encounter (Signed)
 FYI Only or Action Required?: FYI only for provider  Patient was last seen in primary care on 03/16/2023 by Valli Gaw, MD. Called Nurse Triage reporting Abrasion. Symptoms began several days ago. Interventions attempted: Rest, hydration, or home remedies. Symptoms are: gradually worsening.  Triage Disposition: See Physician Within 24 Hours  Patient/caregiver understands and will follow disposition?: Yes                             Copied from CRM [id number]. Topic: Clinical - Red Word Triage >> Jun 29, 2023 10:22 AM Chrystal Crape R wrote: Pt was playing with her dog and was scratched, after a day it become swollen and hard also painful and tender. Reason for Disposition  [1] Looks infected (spreading redness, pus) AND [2] no fever  Answer Assessment - Initial Assessment Questions 1. APPEARANCE of INJURY: "What does the injury look like?"      Skin tear with surrounding redness and warmth 2. SIZE: "How large is the cut?"      Tear is size of a dime, redness is about the length of her thumb 3. BLEEDING: "Is it bleeding now?" If Yes, ask: "Is it difficult to stop?"      Denies bleeding at this current time because she has a bandage on  4. LOCATION: "Where is the injury located?"      Right hand/arm 5. ONSET: "How long ago did the injury occur?"      Saturday 6. MECHANISM: "Tell me how it happened."      Dog scratched her Denies fever Treated area with peroxide, Vaseline and bandage  Protocols used: Cuts and Lacerations-A-AH

## 2023-06-29 NOTE — Telephone Encounter (Signed)
 Noted pt has an appointment 06/30/2023 with Dr. Sueanne Emerald.

## 2023-06-30 ENCOUNTER — Encounter: Payer: Self-pay | Admitting: Family Medicine

## 2023-06-30 ENCOUNTER — Ambulatory Visit (INDEPENDENT_AMBULATORY_CARE_PROVIDER_SITE_OTHER): Admitting: Family Medicine

## 2023-06-30 VITALS — BP 114/72 | HR 63 | Temp 97.9°F | Resp 20 | Ht 62.0 in | Wt 127.5 lb

## 2023-06-30 DIAGNOSIS — E1169 Type 2 diabetes mellitus with other specified complication: Secondary | ICD-10-CM

## 2023-06-30 DIAGNOSIS — E118 Type 2 diabetes mellitus with unspecified complications: Secondary | ICD-10-CM

## 2023-06-30 DIAGNOSIS — G4709 Other insomnia: Secondary | ICD-10-CM

## 2023-06-30 DIAGNOSIS — T148XXA Other injury of unspecified body region, initial encounter: Secondary | ICD-10-CM | POA: Insufficient documentation

## 2023-06-30 DIAGNOSIS — Z7985 Long-term (current) use of injectable non-insulin antidiabetic drugs: Secondary | ICD-10-CM

## 2023-06-30 DIAGNOSIS — E785 Hyperlipidemia, unspecified: Secondary | ICD-10-CM

## 2023-06-30 DIAGNOSIS — J309 Allergic rhinitis, unspecified: Secondary | ICD-10-CM | POA: Diagnosis not present

## 2023-06-30 DIAGNOSIS — Z23 Encounter for immunization: Secondary | ICD-10-CM | POA: Diagnosis not present

## 2023-06-30 MED ORDER — ROSUVASTATIN CALCIUM 20 MG PO TABS: 20.0000 mg | ORAL_TABLET | Freq: Every day | ORAL | 2 refills | Status: AC

## 2023-06-30 MED ORDER — ZOLPIDEM TARTRATE ER 6.25 MG PO TBCR
12.5000 mg | EXTENDED_RELEASE_TABLET | Freq: Every evening | ORAL | 3 refills | Status: DC | PRN
Start: 2023-06-30 — End: 2023-12-14

## 2023-06-30 MED ORDER — FEXOFENADINE HCL 180 MG PO TABS: 180.0000 mg | ORAL_TABLET | Freq: Every day | ORAL | 3 refills | Status: AC | PRN

## 2023-06-30 MED ORDER — MOUNJARO 2.5 MG/0.5ML ~~LOC~~ SOAJ: SUBCUTANEOUS | 1 refills | Status: DC

## 2023-06-30 NOTE — Assessment & Plan Note (Signed)
 Refill Crestor  20 mg daily

## 2023-06-30 NOTE — Assessment & Plan Note (Signed)
 Refill    Mounjaro

## 2023-06-30 NOTE — Progress Notes (Signed)
 SUBJECTIVE:   Chief Complaint  Patient presents with   Abrasion    Dog scratched her Saturday started using Vaseline yesterday also been cleaning it with peroxide    HPI Presents to clinic for acute visit  Discussed the use of AI scribe software for clinical note transcription with the patient, who gave verbal consent to proceed.  History of Present Illness Kristin [name] "Kristin [name]" is a 63 year old female who presents with a dog bite on her right forearm.  She was bitten by her four-year-old golden retriever on Saturday morning. The bite was initially bright red and swollen, but has improved significantly since then. She has been applying a generic antibiotic ointment, which has reduced the redness.  The area was warm last night, but she did not have a fever. The bite is located on her right forearm and was initially bleeding, but today is the first day she could remove the Band-Aid without it bleeding. The skin is healing and still sensitive.  She has been covering the wound with a Band-Aid to prevent her dog from licking it. Her husband, [name], is out of town, and the dog is more protective in his absence. She has also been using Vaseline on the wound.  She is currently taking Celebrex 200 mg, which helps with inflammation and pain management. No fever and the redness is not spreading up her arm.     PERTINENT PMH / PSH: As above  OBJECTIVE:  BP 114/72   Pulse 63   Temp 97.9 F (36.6 C)   Resp 20   Ht 5\' 2"  (1.575 m)   Wt 127 lb 8 oz (57.8 kg)   SpO2 99%   BMI 23.32 kg/m    Physical Exam Vitals reviewed.  Constitutional:      Appearance: She is not ill-appearing or toxic-appearing.  Skin:    General: Skin is warm.  Neurological:     Mental Status: She is alert.             06/30/2023   10:09 AM 03/16/2023    9:11 AM 01/28/2023    2:31 PM 01/28/2023    2:13 PM 01/01/2023    2:43 PM  Depression screen PHQ 2/9  Decreased Interest 1 1   2   Down,  Depressed, Hopeless 1 2   2   PHQ - 2 Score 2 3   4   Altered sleeping 3 3   2   Tired, decreased energy 2 2   2   Change in appetite 1 3   1   Feeling bad or failure about yourself  2 3   2   Trouble concentrating 2 1   2   Moving slowly or fidgety/restless 0 0   0  Suicidal thoughts 0 0   0  PHQ-9 Score 12 15   13   Difficult doing work/chores Somewhat difficult Somewhat difficult   Very difficult     Information is confidential and restricted. Go to Review Flowsheets to unlock data.      06/30/2023   10:09 AM 03/16/2023    9:11 AM 01/28/2023    2:30 PM 01/01/2023    2:44 PM  GAD 7 : Generalized Anxiety Score  Nervous, Anxious, on Edge 3 3  3   Control/stop worrying 3 3  3   Worry too much - different things 2 3  3   Trouble relaxing 2 2  2   Restless 0 1  0  Easily annoyed or irritable 1 1  1   Afraid - awful might  happen 3 3  2   Total GAD 7 Score 14 16  14   Anxiety Difficulty Very difficult Extremely difficult  Very difficult     Information is confidential and restricted. Go to Review Flowsheets to unlock data.    ASSESSMENT/PLAN:  Wound of skin Assessment & Plan: Wound healing well with topical antibiotic. No signs of systemic infection. Sensitive area remains. - Continue topical antibiotic as needed. - Use nonstick dressing when dog is present. - Monitor for infection signs such as fever or increased erythema. - Provide nonstick dressing supplies. - Tdap administered today   Type 2 diabetes mellitus with complications (HCC) Assessment & Plan: Refill Mounjaro   Orders: -     Mounjaro ; Every other week  Dispense: 6 mL; Refill: 1  Hyperlipidemia associated with type 2 diabetes mellitus (HCC) Assessment & Plan: Refill Crestor  20 mg daily  Orders: -     Rosuvastatin  Calcium ; Take 1 tablet (20 mg total) by mouth daily.  Dispense: 90 tablet; Refill: 2  Allergic rhinitis, unspecified seasonality, unspecified trigger Assessment & Plan: Refill Allegra   Orders: -      Fexofenadine  HCl; Take 1 tablet (180 mg total) by mouth daily as needed for allergies or rhinitis.  Dispense: 90 tablet; Refill: 3  Other insomnia Assessment & Plan: Refill Ambien  PDMP reviewed and appropriate  Orders: -     Zolpidem  Tartrate ER; Take 2 tablets (12.5 mg total) by mouth at bedtime as needed for sleep.  Dispense: 60 tablet; Refill: 3  Encounter for immunization -     Tdap vaccine greater than or equal to 7yo IM    PDMP reviewed  Return if symptoms worsen or fail to improve, for PCP.  Valli Gaw, MD

## 2023-06-30 NOTE — Assessment & Plan Note (Addendum)
 Refill Ambien  PDMP reviewed and appropriate

## 2023-06-30 NOTE — Assessment & Plan Note (Signed)
 Wound healing well with topical antibiotic. No signs of systemic infection. Sensitive area remains. - Continue topical antibiotic as needed. - Use nonstick dressing when dog is present. - Monitor for infection signs such as fever or increased erythema. - Provide nonstick dressing supplies. - Tdap administered today

## 2023-06-30 NOTE — Assessment & Plan Note (Signed)
Refill Allegra

## 2023-07-05 ENCOUNTER — Other Ambulatory Visit: Payer: Self-pay | Admitting: Child and Adolescent Psychiatry

## 2023-07-05 DIAGNOSIS — F411 Generalized anxiety disorder: Secondary | ICD-10-CM

## 2023-07-06 ENCOUNTER — Ambulatory Visit: Payer: BC Managed Care – PPO | Admitting: Dermatology

## 2023-07-06 DIAGNOSIS — Z85828 Personal history of other malignant neoplasm of skin: Secondary | ICD-10-CM

## 2023-07-06 DIAGNOSIS — Z1283 Encounter for screening for malignant neoplasm of skin: Secondary | ICD-10-CM

## 2023-07-06 DIAGNOSIS — Z872 Personal history of diseases of the skin and subcutaneous tissue: Secondary | ICD-10-CM

## 2023-07-06 DIAGNOSIS — W908XXA Exposure to other nonionizing radiation, initial encounter: Secondary | ICD-10-CM

## 2023-07-06 DIAGNOSIS — L408 Other psoriasis: Secondary | ICD-10-CM

## 2023-07-06 DIAGNOSIS — L578 Other skin changes due to chronic exposure to nonionizing radiation: Secondary | ICD-10-CM

## 2023-07-06 DIAGNOSIS — D1801 Hemangioma of skin and subcutaneous tissue: Secondary | ICD-10-CM

## 2023-07-06 DIAGNOSIS — D489 Neoplasm of uncertain behavior, unspecified: Secondary | ICD-10-CM

## 2023-07-06 DIAGNOSIS — L219 Seborrheic dermatitis, unspecified: Secondary | ICD-10-CM | POA: Diagnosis not present

## 2023-07-06 DIAGNOSIS — L570 Actinic keratosis: Secondary | ICD-10-CM | POA: Diagnosis not present

## 2023-07-06 DIAGNOSIS — L814 Other melanin hyperpigmentation: Secondary | ICD-10-CM

## 2023-07-06 DIAGNOSIS — D492 Neoplasm of unspecified behavior of bone, soft tissue, and skin: Secondary | ICD-10-CM

## 2023-07-06 DIAGNOSIS — C44719 Basal cell carcinoma of skin of left lower limb, including hip: Secondary | ICD-10-CM

## 2023-07-06 DIAGNOSIS — L821 Other seborrheic keratosis: Secondary | ICD-10-CM

## 2023-07-06 DIAGNOSIS — D2261 Melanocytic nevi of right upper limb, including shoulder: Secondary | ICD-10-CM

## 2023-07-06 DIAGNOSIS — L720 Epidermal cyst: Secondary | ICD-10-CM

## 2023-07-06 DIAGNOSIS — L820 Inflamed seborrheic keratosis: Secondary | ICD-10-CM

## 2023-07-06 DIAGNOSIS — Z86007 Personal history of in-situ neoplasm of skin: Secondary | ICD-10-CM

## 2023-07-06 DIAGNOSIS — D229 Melanocytic nevi, unspecified: Secondary | ICD-10-CM

## 2023-07-06 MED ORDER — ZORYVE 0.3 % EX CREA: TOPICAL_CREAM | CUTANEOUS | 4 refills | Status: AC

## 2023-07-06 NOTE — Patient Instructions (Addendum)
 Biopsy Wound Care Instructions  Leave the original bandage on for 24 hours if possible.  If the bandage becomes soaked or soiled before that time, it is OK to remove it and examine the wound.  A small amount of post-operative bleeding is normal.  If excessive bleeding occurs, remove the bandage, place gauze over the site and apply continuous pressure (no peeking) over the area for 30 minutes. If this does not work, please call our clinic as soon as possible or page your doctor if it is after hours.   Once a day, cleanse the wound with soap and water. It is fine to shower. If a thick crust develops you may use a Q-tip dipped into dilute hydrogen peroxide (mix 1:1 with water) to dissolve it.  Hydrogen peroxide can slow the healing process, so use it only as needed.    After washing, apply petroleum jelly (Vaseline) or an antibiotic ointment if your doctor prescribed one for you, followed by a bandage.    For best healing, the wound should be covered with a layer of ointment at all times. If you are not able to keep the area covered with a bandage to hold the ointment in place, this may mean re-applying the ointment several times a day.  Continue this wound care until the wound has healed and is no longer open.   Itching and mild discomfort is normal during the healing process. However, if you develop pain or severe itching, please call our office.   If you have any discomfort, you can take Tylenol (acetaminophen) or ibuprofen as directed on the bottle. (Please do not take these if you have an allergy to them or cannot take them for another reason).  Some redness, tenderness and white or yellow material in the wound is normal healing.  If the area becomes very sore and red, or develops a thick yellow-green material (pus), it may be infected; please notify us.    If you have stitches, return to clinic as directed to have the stitches removed. You will continue wound care for 2-3 days after the stitches  are removed.   Wound healing continues for up to one year following surgery. It is not unusual to experience pain in the scar from time to time during the interval.  If the pain becomes severe or the scar thickens, you should notify the office.    A slight amount of redness in a scar is expected for the first six months.  After six months, the redness will fade and the scar will soften and fade.  The color difference becomes less noticeable with time.  If there are any problems, return for a post-op surgery check at your earliest convenience.  To improve the appearance of the scar, you can use silicone scar gel, cream, or sheets (such as Mederma or Serica) every night for up to one year. These are available over the counter (without a prescription).  Please call our office at [phone] for any questions or concerns.     Electrodesiccation and Curettage ("Scrape and Burn") Wound Care Instructions  Leave the original bandage on for 24 hours if possible.  If the bandage becomes soaked or soiled before that time, it is OK to remove it and examine the wound.  A small amount of post-operative bleeding is normal.  If excessive bleeding occurs, remove the bandage, place gauze over the site and apply continuous pressure (no peeking) over the area for 30 minutes. If this does not work, please  call our clinic as soon as possible or page your doctor if it is after hours.   Once a day, cleanse the wound with soap and water. It is fine to shower. If a thick crust develops you may use a Q-tip dipped into dilute hydrogen peroxide (mix 1:1 with water) to dissolve it.  Hydrogen peroxide can slow the healing process, so use it only as needed.    After washing, apply petroleum jelly (Vaseline) or an antibiotic ointment if your doctor prescribed one for you, followed by a bandage.    For best healing, the wound should be covered with a layer of ointment at all times. If you are not able to keep the area covered  with a bandage to hold the ointment in place, this may mean re-applying the ointment several times a day.  Continue this wound care until the wound has healed and is no longer open. It may take several weeks for the wound to heal and close.  Itching and mild discomfort is normal during the healing process.  If you have any discomfort, you can take Tylenol (acetaminophen) or ibuprofen as directed on the bottle. (Please do not take these if you have an allergy to them or cannot take them for another reason).  Some redness, tenderness and white or yellow material in the wound is normal healing.  If the area becomes very sore and red, or develops a thick yellow-green material (pus), it may be infected; please notify us.    Wound healing continues for up to one year following surgery. It is not unusual to experience pain in the scar from time to time during the interval.  If the pain becomes severe or the scar thickens, you should notify the office.    A slight amount of redness in a scar is expected for the first six months.  After six months, the redness will fade and the scar will soften and fade.  The color difference becomes less noticeable with time.  If there are any problems, return for a post-op surgery check at your earliest convenience.  To improve the appearance of the scar, you can use silicone scar gel, cream, or sheets (such as Mederma or Serica) every night for up to one year. These are available over the counter (without a prescription).  Please call our office at [phone] for any questions or concerns.    Actinic keratoses are precancerous spots that appear secondary to cumulative UV radiation exposure/sun exposure over time. They are chronic with expected duration over 1 year. A portion of actinic keratoses will progress to squamous cell carcinoma of the skin. It is not possible to reliably predict which spots will progress to skin cancer and so treatment is recommended to  prevent development of skin cancer.  Recommend daily broad spectrum sunscreen SPF 30+ to sun-exposed areas, reapply every 2 hours as needed.  Recommend staying in the shade or wearing long sleeves, sun glasses (UVA+UVB protection) and wide brim hats (4-inch brim around the entire circumference of the hat). Call for new or changing lesions.    Cryotherapy Aftercare  Wash gently with soap and water everyday.   Apply Vaseline and Band-Aid daily until healed.    Seborrheic Keratosis  What causes seborrheic keratoses? Seborrheic keratoses are harmless, common skin growths that first appear during adult life.  As time goes by, more growths appear.  Some people may develop a large number of them.  Seborrheic keratoses appear on both covered and uncovered body parts.  They are not caused by sunlight.  The tendency to develop seborrheic keratoses can be inherited.  They vary in color from skin-colored to gray, brown, or even black.  They can be either smooth or have a rough, warty surface.   Seborrheic keratoses are superficial and look as if they were stuck on the skin.  Under the microscope this type of keratosis looks like layers upon layers of skin.  That is why at times the top layer may seem to fall off, but the rest of the growth remains and re-grows.    Treatment Seborrheic keratoses do not need to be treated, but can easily be removed in the office.  Seborrheic keratoses often cause symptoms when they rub on clothing or jewelry.  Lesions can be in the way of shaving.  If they become inflamed, they can cause itching, soreness, or burning.  Removal of a seborrheic keratosis can be accomplished by freezing, burning, or surgery. If any spot bleeds, scabs, or grows rapidly, please return to have it checked, as these can be an indication of a skin cancer.     Melanoma ABCDEs  Melanoma is the most dangerous type of skin cancer, and is the leading cause of death from skin disease.  You are more  likely to develop melanoma if you: Have light-colored skin, light-colored eyes, or red or blond hair Spend a lot of time in the sun Tan regularly, either outdoors or in a tanning bed Have had blistering sunburns, especially during childhood Have a close family member who has had a melanoma Have atypical moles or large birthmarks  Early detection of melanoma is key since treatment is typically straightforward and cure rates are extremely high if we catch it early.   The first sign of melanoma is often a change in a mole or a new dark spot.  The ABCDE system is a way of remembering the signs of melanoma.  A for asymmetry:  The two halves do not match. B for border:  The edges of the growth are irregular. C for color:  A mixture of colors are present instead of an even brown color. D for diameter:  Melanomas are usually (but not always) greater than 6mm - the size of a pencil eraser. E for evolution:  The spot keeps changing in size, shape, and color.  Please check your skin once per month between visits. You can use a small mirror in front and a large mirror behind you to keep an eye on the back side or your body.   If you see any new or changing lesions before your next follow-up, please call to schedule a visit.  Please continue daily skin protection including broad spectrum sunscreen SPF 30+ to sun-exposed areas, reapplying every 2 hours as needed when you're outdoors.   Staying in the shade or wearing long sleeves, sun glasses (UVA+UVB protection) and wide brim hats (4-inch brim around the entire circumference of the hat) are also recommended for sun protection.    Due to recent changes in healthcare laws, you may see results of your pathology and/or laboratory studies on MyChart before the doctors have had a chance to review them. We understand that in some cases there may be results that are confusing or concerning to you. Please understand that not all results are received at the same  time and often the doctors may need to interpret multiple results in order to provide you with the best plan of care or course of treatment. Therefore,  we ask that you please give [place] 2 business days to thoroughly review all your results before contacting the office for clarification. Should we see a critical lab result, you will be contacted sooner.   If You Need Anything After Your Visit  If you have any questions or concerns for your doctor, please call our main line at [phone] and press option 4 to reach your doctor's medical assistant. If no one answers, please leave a voicemail as directed and we will return your call as soon as possible. Messages left after 4 pm will be answered the following business day.   You may also send [place] a message via MyChart. We typically respond to MyChart messages within 1-2 business days.  For prescription refills, please ask your pharmacy to contact our office. Our fax number is [phone].  If you have an urgent issue when the clinic is closed that cannot wait until the next business day, you can page your doctor at the number below.    Please note that while we do our best to be available for urgent issues outside of office hours, we are not available 24/7.   If you have an urgent issue and are unable to reach [place], you may choose to seek medical care at your doctor's office, retail clinic, urgent care center, or emergency room.  If you have a medical emergency, please immediately call 911 or go to the emergency department.  Pager Numbers  - Dr. Gwen Pounds: [phone]  - Dr. Roseanne Reno: [phone]  - Dr. Katrinka Blazing: [phone]   In the event of inclement weather, please call our main line at [phone] for an update on the status of any delays or closures.  Dermatology Medication Tips: Please keep the boxes that topical medications come in in order to help keep track of the instructions about where and how to use these. Pharmacies typically print  the medication instructions only on the boxes and not directly on the medication tubes.   If your medication is too expensive, please contact our office at [phone] option 4 or send [place] a message through MyChart.   We are unable to tell what your co-pay for medications will be in advance as this is different depending on your insurance coverage. However, we may be able to find a substitute medication at lower cost or fill out paperwork to get insurance to cover a needed medication.   If a prior authorization is required to get your medication covered by your insurance company, please allow [place] 1-2 business days to complete this process.  Drug prices often vary depending on where the prescription is filled and some pharmacies may offer cheaper prices.  The website www.goodrx.com contains coupons for medications through different pharmacies. The prices here do not account for what the cost may be with help from insurance (it may be cheaper with your insurance), but the website can give you the price if you did not use any insurance.  - You can print the associated coupon and take it with your prescription to the pharmacy.  - You may also stop by our office during regular business hours and pick up a GoodRx coupon card.  - If you need your prescription sent electronically to a different pharmacy, notify our office through [facility] or by phone at [phone] option 4.     Si Usted Necesita Algo Despus de Su Visita  Tambin puede enviarnos un mensaje a travs de Clinical cytogeneticist. Por lo general respondemos a los mensajes de [employer]  en el transcurso de 1 a 2 das hbiles.  Para renovar recetas, por favor pida a su farmacia que se ponga en contacto con nuestra oficina. Annie Sable de fax es [place] [phone].  Si tiene un asunto urgente cuando la clnica est cerrada y que no puede esperar hasta el siguiente da hbil, puede llamar/localizar a su doctor(a) al nmero que aparece a  continuacin.   Por favor, tenga en cuenta que aunque hacemos todo lo posible para estar disponibles para asuntos urgentes fuera del horario de [place], no estamos disponibles las 24 horas del da, los 7 [address]  [address] de la [place].   Si tiene un problema urgente y no puede comunicarse con nosotros, puede optar por buscar atencin mdica  en el consultorio de su doctor(a), en una clnica privada, en un centro de atencin urgente o en una sala de emergencias.  Si tiene Engineer, drilling, por favor llame inmediatamente al 911 o vaya a la sala de emergencias.  Nmeros de bper  - Dr. Gwen Pounds: [phone]  - Dra. Roseanne Reno: [phone]  - Dr. Katrinka Blazing: [phone]   En caso de inclemencias del tiempo, por favor llame a Lacy Duverney principal al [phone] para una actualizacin sobre el [place] de cualquier retraso o cierre.  Consejos para la medicacin en dermatologa: Por favor, guarde las cajas en las que vienen los medicamentos de uso tpico para ayudarle a seguir las instrucciones sobre dnde y cmo usarlos. Las farmacias generalmente imprimen las instrucciones del medicamento slo en las cajas y no directamente en los tubos del [place].   Si su medicamento es muy caro, por favor, pngase en contacto con Rolm Gala llamando al [phone] y presione la opcin 4 o envenos un mensaje a travs de Clinical cytogeneticist.   No podemos decirle cul ser su copago por los medicamentos por adelantado ya que esto es diferente dependiendo de la cobertura de su seguro. Sin embargo, es posible que podamos encontrar un medicamento sustituto a Audiological scientist un formulario para que el seguro cubra el medicamento que se considera necesario.   Si se requiere una autorizacin previa para que su compaa de seguros [place] su medicamento, por favor permtanos de 1 a 2 das hbiles para completar [address]  Los precios de los medicamentos varan con frecuencia dependiendo del Environmental consultant de dnde se surte la receta  y alguna farmacias pueden ofrecer precios ms baratos.  El sitio web www.goodrx.com tiene cupones para medicamentos de Health and safety inspector. Los precios aqu no tienen en cuenta lo que podra costar con la ayuda del seguro (puede ser ms barato con su seguro), pero el sitio web puede darle el precio si no utiliz Tourist information centre manager.  - Puede imprimir el cupn correspondiente y llevarlo con su receta a la farmacia.  - Tambin puede pasar por nuestra oficina durante el horario de atencin regular y Education officer, museum una tarjeta de cupones de GoodRx.  - Si necesita que su receta se enve electrnicamente a una farmacia diferente, informe a nuestra oficina a travs de MyChart de [place] o por telfono llamando al [phone] y presione la opcin 4.

## 2023-07-06 NOTE — Progress Notes (Signed)
 Follow-Up Visit   Subjective  Kristin [name] is a 63 y.o. female who presents for the following: Skin Cancer Screening and Full Body Skin Exam Hx of sks, isk, aks, bcc, sccis,   Hx of aks at left ear upper helix and right wrist  Spots to check back of left thigh, right shoulder, right upper back, left side of nose, right corner area of eye   The patient presents for Total-Body Skin Exam (TBSE) for skin cancer screening and mole check. The patient has spots, moles and lesions to be evaluated, some may be new or changing and the patient may have concern these could be cancer.   The following portions of the chart were reviewed this encounter and updated as appropriate: medications, allergies, medical history  Review of Systems:  No other skin or systemic complaints except as noted in HPI or Assessment and Plan.  Objective  Well appearing patient in no apparent distress; mood and affect are within normal limits.  A full examination was performed including scalp, head, eyes, ears, nose, lips, neck, chest, axillae, abdomen, back, buttocks, bilateral upper extremities, bilateral lower extremities, hands, feet, fingers, toes, fingernails, and toenails. All findings within normal limits unless otherwise noted below.   Relevant physical exam findings are noted in the Assessment and Plan.  right lateral canthus Erythematous stuck-on, waxy papule right medial ankle x 1 Erythematous thin papules/macules with gritty scale.  left pretibia 4 mm pink pearly papule    Assessment & Plan   SKIN CANCER SCREENING PERFORMED TODAY.  ACTINIC DAMAGE - Chronic condition, secondary to cumulative UV/sun exposure - diffuse scaly erythematous macules with underlying dyspigmentation - Recommend daily broad spectrum sunscreen SPF 30+ to sun-exposed areas, reapply every 2 hours as needed.  - Staying in the shade or wearing long sleeves, sun glasses (UVA+UVB protection) and wide brim hats (4-inch brim  around the entire circumference of the hat) are also recommended for sun protection.  - Call for new or changing lesions.  LENTIGINES, SEBORRHEIC KERATOSES, HEMANGIOMAS - Benign normal skin lesions - Benign-appearing - Call for any changes Sk at right zygoma  MILIA Exam: tiny erythematous firm white papule left side of nose  Treatment Plan: Symptomatic, irritating, patient would like treated.  Discussed this is a type of cyst. Benign-appearing. Sometimes these will clear with OTC adapalene/Differin 0.1% cream QHS or retinol.  Discussed extraction if symptomatic.  MELANOCYTIC NEVI - Tan-brown and/or pink-flesh-colored symmetric macules and papules - 5 mm flesh papule at right posterior shoulder   - 6 x 4 mm light tan flesh papule -Top of right shoulder  - 2.5 mm tan papule with emerging hair at left posterior thigh  - Benign appearing on exam today - Observation - Call clinic for new or changing moles - Recommend daily use of broad spectrum spf 30+ sunscreen to sun-exposed areas.    SEBORRHEIC DERMATITIS/Sebopsoriasis Exam: Pink patches with greasy scale at b/l eyebrows, alar crease   Chronic and persistent condition with duration or expected duration over one year. Condition is symptomatic/ bothersome to patient. Not currently at goal.   Seborrheic Dermatitis is a chronic persistent rash characterized by pinkness and scaling most commonly of the mid face but also can occur on the scalp (dandruff), ears; mid chest, mid back and groin.  It tends to be exacerbated by stress and cooler weather.  People who have neurologic disease may experience new onset or exacerbation of existing seborrheic dermatitis.  The condition is not curable but treatable and can  be controlled.  Treatment Plan: Start Zoryve 0.3 % cream apply once daily to aa's at face  Rx sent to [facility] given today   HISTORY OF BASAL CELL CARCINOMA OF THE SKIN 03/26/2021 Left chest - [facility] 04-24-2021  See history -  No evidence of recurrence today - Recommend regular full body skin exams - Recommend daily broad spectrum sunscreen SPF 30+ to sun-exposed areas, reapply every 2 hours as needed.  - Call if any new or changing lesions are noted between office visits  HISTORY OF SQUAMOUS CELL CARCINOMA IN-SITU OF THE SKIN - 03/26/2021 right mid sternum, ED&C 04/24/2021 - No evidence of recurrence today - Recommend regular full body skin exams - Recommend daily broad spectrum sunscreen SPF 30+ to sun-exposed areas, reapply every 2 hours as needed.  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 - these may recur and new lesions may form requiring treatment to prevent transformation into skin cancer - observe for new or changing spots and contact [facility] for appointment if occur - photoprotection with sun protective clothing; sunglasses and broad spectrum sunscreen with SPF of at least 30 + and frequent self skin exams recommended - yearly exams by a dermatologist recommended for persons with history of PreCancerous Actinic Keratoses  INFLAMED SEBORRHEIC KERATOSIS right lateral canthus Symptomatic, irritating, patient would like treated.  Patient defers treatment today due to upcoming event may consider at next follow up SEBORRHEIC DERMATITIS   Related Medications Roflumilast (ZORYVE) 0.3 % CREA Apply topically to dry patches at face once daily prn ACTINIC KERATOSIS right medial ankle x 1 Vs early BCC, recheck on f/up  Actinic keratoses are precancerous spots that appear secondary to cumulative UV radiation exposure/sun exposure over time. They are chronic with expected duration over 1 year. A portion of actinic keratoses will progress to squamous cell carcinoma of the skin. It is not possible to reliably predict which spots will  progress to skin cancer and so treatment is recommended to prevent development of skin cancer.  Recommend daily broad spectrum sunscreen SPF 30+ to sun-exposed areas, reapply every 2 hours as needed.  Recommend staying in the shade or wearing long sleeves, sun glasses (UVA+UVB protection) and wide brim hats (4-inch brim around the entire circumference of the hat). Call for new or changing lesions. Destruction of lesion - right medial ankle x 1  Destruction method: cryotherapy   Informed consent: discussed and consent obtained   Lesion destroyed using liquid nitrogen: Yes   Region frozen until ice ball extended beyond lesion: Yes   Outcome: patient tolerated procedure well with no complications   Post-procedure details: wound care instructions given   Additional details:  Prior to procedure, discussed risks of blister formation, small wound, skin dyspigmentation, or rare scar following cryotherapy. Recommend Vaseline ointment to treated areas while healing.  NEOPLASM OF UNCERTAIN BEHAVIOR left pretibia Epidermal / dermal shaving  Lesion diameter (cm):  0.4 Informed consent: discussed and consent obtained   Patient was prepped and draped in usual sterile fashion: Area prepped with alcohol. Anesthesia: the lesion was anesthetized in a standard fashion   Anesthetic:  1% lidocaine  w/ epinephrine  1-100,000 buffered w/ 8.4% NaHCO3 Instrument used: flexible razor blade   Hemostasis achieved with: pressure, aluminum chloride and electrodesiccation   Outcome: patient tolerated procedure well    Destruction of lesion  Destruction method: electrodesiccation and curettage   Informed consent: discussed and consent obtained   Curettage performed in three different directions: Yes   Electrodesiccation performed over the curetted area: Yes   Final wound size (cm):  0.5 Hemostasis achieved with:  pressure, aluminum chloride and electrodesiccation Outcome: patient tolerated procedure well with no  complications   Post-procedure details: wound care instructions given   Additional details:  Mupirocin  ointment and Bandaid applied  Specimen 1 - Surgical pathology Differential Diagnosis: r/o bcc  ED&C today   Check Margins: No R/o bcc  Return in about 1 year (around 07/05/2024) for TBSE.  I, Randee Busing, CMA, am acting as scribe for Artemio Larry, MD.   Documentation: I have reviewed the above documentation for accuracy and completeness, and I agree with the above.  Artemio Larry, MD

## 2023-07-06 NOTE — Telephone Encounter (Signed)
 Dr. Elvera Maria pt

## 2023-07-08 LAB — SURGICAL PATHOLOGY

## 2023-07-09 DIAGNOSIS — M5442 Lumbago with sciatica, left side: Secondary | ICD-10-CM | POA: Diagnosis not present

## 2023-07-09 DIAGNOSIS — M47816 Spondylosis without myelopathy or radiculopathy, lumbar region: Secondary | ICD-10-CM | POA: Diagnosis not present

## 2023-07-09 DIAGNOSIS — M5416 Radiculopathy, lumbar region: Secondary | ICD-10-CM | POA: Diagnosis not present

## 2023-07-09 DIAGNOSIS — M5441 Lumbago with sciatica, right side: Secondary | ICD-10-CM | POA: Diagnosis not present

## 2023-07-13 ENCOUNTER — Encounter: Payer: Self-pay | Admitting: Dermatology

## 2023-07-13 ENCOUNTER — Ambulatory Visit: Payer: Self-pay | Admitting: Dermatology

## 2023-07-13 NOTE — Telephone Encounter (Signed)
 Advised pt of bx results.  Scheduled pt for 73m f/u to recheck BCC./sh

## 2023-07-13 NOTE — Telephone Encounter (Signed)
-----   Message from Rexene Rattler sent at 07/13/2023 12:41 PM EDT ----- 1. Skin, left pretibia :       SUPERFICIAL BASAL CELL CARCINOMA, PRESENT IN EDGES AND BASE  BCC skin cancer- already treated with EDC at time of biopsy   - please call patient ----- Message ----- From: Interface, [facility] Sent: 07/08/2023   3:55 PM EDT To: Rexene Rattler, MD

## 2023-07-15 DIAGNOSIS — M47816 Spondylosis without myelopathy or radiculopathy, lumbar region: Secondary | ICD-10-CM | POA: Diagnosis not present

## 2023-07-16 DIAGNOSIS — F331 Major depressive disorder, recurrent, moderate: Secondary | ICD-10-CM | POA: Diagnosis not present

## 2023-07-16 DIAGNOSIS — F411 Generalized anxiety disorder: Secondary | ICD-10-CM | POA: Diagnosis not present

## 2023-07-16 DIAGNOSIS — F901 Attention-deficit hyperactivity disorder, predominantly hyperactive type: Secondary | ICD-10-CM | POA: Diagnosis not present

## 2023-07-23 ENCOUNTER — Ambulatory Visit: Payer: Self-pay

## 2023-07-23 ENCOUNTER — Ambulatory Visit
Admission: EM | Admit: 2023-07-23 | Discharge: 2023-07-23 | Disposition: A | Discharge status: Home / Self Care | Attending: Family Medicine | Admitting: Family Medicine

## 2023-07-23 ENCOUNTER — Ambulatory Visit (INDEPENDENT_AMBULATORY_CARE_PROVIDER_SITE_OTHER)

## 2023-07-23 DIAGNOSIS — S50812A Abrasion of left forearm, initial encounter: Secondary | ICD-10-CM | POA: Diagnosis not present

## 2023-07-23 DIAGNOSIS — M79672 Pain in left foot: Secondary | ICD-10-CM

## 2023-07-23 DIAGNOSIS — S92512A Displaced fracture of proximal phalanx of left lesser toe(s), initial encounter for closed fracture: Secondary | ICD-10-CM

## 2023-07-23 DIAGNOSIS — M19072 Primary osteoarthritis, left ankle and foot: Secondary | ICD-10-CM | POA: Diagnosis not present

## 2023-07-23 DIAGNOSIS — M7732 Calcaneal spur, left foot: Secondary | ICD-10-CM | POA: Diagnosis not present

## 2023-07-23 MED ORDER — TRAMADOL HCL 50 MG PO TABS: 50.0000 mg | ORAL_TABLET | Freq: Three times a day (TID) | ORAL | 0 refills | Status: AC | PRN

## 2023-07-23 MED ORDER — TRAMADOL HCL 50 MG PO TABS: 50.0000 mg | ORAL_TABLET | Freq: Four times a day (QID) | ORAL | 0 refills | Status: DC | PRN

## 2023-07-23 NOTE — Discharge Instructions (Signed)
 Keep your abrasions clean and dry.  Keep your fifth toe taped to your fourth toe to help support the area.  You may use a postop shoe as needed.  May still elevate and ice as needed.  Tramadol  as needed for pain.  If you are taking the tramadol  do not take your Ambien  as this can cause oversedation and respiratory depression.  Also do not take Klonopin  if you are taking tramadol  as it can cause affect.  Please go to the ER if you develop any worsening symptoms which include but are not limited to uncontrolled pain or swelling, persistent numbness or tingling, or any new concerns that arise.  Please follow-up with your PCP in 1 week for recheck.  I hope you feel better soon!

## 2023-07-23 NOTE — Telephone Encounter (Signed)
 FYI Only or Action Required?: FYI only for provider.  Patient was last seen in primary care on 06/30/2023 by Hope Merle, MD. Called Nurse Triage reporting Fall. Symptoms began today. Interventions attempted: Other: Bandage on scrapes. Symptoms are: gradually worsening.  Triage Disposition: Go to ED Now (Notify PCP)  Patient/caregiver understands and will follow disposition?: Yes  ** Patient will be seen in ED/UC for immediate evaluation**        Copied from CRM [id number]. Topic: Clinical - Red Word Triage >> Jul 23, 2023  3:41 PM Mercedes MATSU wrote: Red Word that prompted transfer to Nurse Triage: Patient called in stating that she fell, scraped up her arm and may have possibly broken her foot. Patient wants to know what she should do. Reason for Disposition  Injury (or injuries) that need emergency care  Answer Assessment - Initial Assessment Questions 1. MECHANISM: How did the fall happen?     Patient fell today, she was reaching for her dog and fell. She lost her balance and hit the fireplace, she scraped her arm. Her foot got twisted.           3. ONSET: When did the fall happen? (e.g., minutes, hours, or days ago)     [place] while grabbing her dog   4. LOCATION: What part of the body hit the ground? (e.g., back, buttocks, head, hips, knees, hands, head, stomach)      Left foot  5. INJURY: Did you hurt (injure) yourself when you fell? If Yes, ask: What did you injure? Tell me more about this? (e.g., body area; type of injury; pain severity)     Yes   6. PAIN: Is there any pain? If Yes, ask: How bad is the pain? (e.g., Scale 1-10; or mild,  moderate, severe)   - NONE (0): No pain   - MILD (1-3): Doesn't interfere with normal activities    - MODERATE (4-7): Interferes with normal activities or awakens from sleep    - SEVERE (8-10): Excruciating pain, unable to do any normal activities       7/10   7. SIZE: For cuts, bruises, or swelling, ask: How large is  it? (e.g., inches or centimeters)      Left arm scrape    9. OTHER SYMPTOMS: Do you have any other symptoms? (e.g., dizziness, fever, weakness; new onset or worsening).      Not currently    10. CAUSE: What do you think caused the fall (or falling)? (e.g., tripped, dizzy spell)       Tripped  Protocols used: Falls and [facility]

## 2023-07-23 NOTE — ED Triage Notes (Signed)
 Pt c/o fall  Pt states that she fell into her fireplace and hit her arm and her left foot  Pt believes that her left foot is broken   Pt has a small skin tear along the left forearm and scratches along her left elbow  Pt denies pain in her wrist, shoulder, or elbow  Pt states that her left foot has pain along the 4th and 5th toe, with pain on the top and bottom of the foot. Pt feels pain when rotating her ankle and states that her toes feel numb.

## 2023-07-23 NOTE — ED Provider Notes (Addendum)
 [facility]    CSN: [id number] Arrival date & time: 07/23/23  1641      History   Chief Complaint Chief Complaint  Patient presents with   Fall    HPI Kristin [name] is a 63 y.o. female presents for foot pain.  Patient reports earlier today she tripped hitting her left foot and left forearm on her fireplace.  She denies head injury or LOC.  Reports since then she has been unable to bear weight on her left foot reports pain and bruising to the lateral aspect of the foot.  No numbness or tingling.  No ankle pain.  She also sustained 2 abrasions to her left forearm.  She otherwise denies any forearm pain.  She is up-to-date on her tetanus.  She does not take any blood thinning medications.  Reports history of surgery to the left ankle in the past but otherwise no surgeries or injuries to the foot.  She took Tylenol  and has been using ice since injury.  No other concerns at this time.   Fall    Past Medical History:  Diagnosis Date   ADHD    Adhesive capsulitis of right shoulder 07/23/2020   Allergy     Annual physical exam 08/19/2019   Anxiety 12/12/2015   Arthritis    Basal cell carcinoma    nose, left cheek, right upper lip, right posterior shoulder most recent 6 years ago on left cheek   Basal cell carcinoma 03/26/2021   L chest, EDC 04/24/2021   Basal cell carcinoma 07/06/2023   L pretibial, EDC   Benign essential hypertension 08/14/2014   Bleeding    Bursitis of right shoulder 12/25/2020   Chicken pox    Chronic diarrhea    Chronic intractable headache 10/20/2017   Chronic migraine w/o aura, not intractable, w/o stat migr 08/08/2020   Complication of anesthesia    blood pressure elevated after gastric sleeve surgery in recovery, pt does not want mask over face; migraines   COVID-19    03/01/20 had MAB 03/03/20 and felt better next day 03/04/20   CTS (carpal tunnel syndrome) 04/05/2020   Dr. Maree s/p [facility]/Lidocaine  injection 12/05/19   Depression     Diabetes mellitus without complication (HCC)    pre-diabetic currently, was diabetic prior to gastric sleeve surgery    Diabetic polyneuropathy associated with diabetes mellitus due to underlying condition (HCC) 08/08/2020   Disorder of right rotator cuff    10/03/19 MRI right shoulder abnormal Dr. Lorriane Blanch   Fall    08/2020   Fall    03/28/21   Fatty liver    Fatty liver 05/06/2017   US  + 2013    Gout    Hematuria 10/15/2018   History of shingles 06/19/2021   lower back and R side of thigh   Hives 03/28/2020   Hyperlipidemia    Hypertension    no problems since losing weight after gastric sleeve surgery   Hypertension 07/17/2014   IBS (irritable bowel syndrome)    Injury of right wrist 06/26/2016   Insomnia    Migraine    Neuropathy    bilateral feet   Obesity    Obesity (BMI 30-39.9) 05/06/2017   Obesity, diabetes, and hypertension syndrome (HCC) 08/18/2019   Osteopenia    Palpitations 08/14/2014   PONV (postoperative nausea and vomiting)    Squamous cell carcinoma of skin 03/26/2021   SCC IS R mid sternum, EDC 04/24/2021   Tendonitis of shoulder, right 12/25/2020  Patient Active Problem List   Diagnosis Date Noted   Wound of skin 06/30/2023   Allergic rhinitis 06/30/2023   Encounter for immunization 06/30/2023   Night sweats 03/20/2023   Back pain 01/04/2023   Urinary frequency 01/04/2023   Orthostatic hypotension 11/02/2022   Loss of weight 11/02/2022   Poor appetite 11/02/2022   Dysuria 11/02/2022   Chest discomfort 11/02/2022   Dizzinesses 11/02/2022   Need for influenza vaccination 11/02/2022   COVID-19 10/06/2022   Bereavement 09/23/2022   GAD (generalized anxiety disorder) 09/23/2022   Diabetic eye exam (HCC) 07/12/2022   Vitamin B 12 deficiency 07/12/2022   Mood disorder (HCC) 05/21/2022   MDD (major depressive disorder), recurrent episode, mild (HCC) 05/21/2022   Attention deficit hyperactivity disorder (ADHD), combined type 05/21/2022   Gastric  erythema 05/20/2022   Dysphagia 04/10/2022   High risk medication use 04/03/2022   Ingrown toenail without infection 02/16/2022   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   Depression 01/27/2022   Hair loss 10/02/2021   Stress 03/28/2020   Hypertension associated with diabetes (HCC) 08/18/2019   Irritable bowel syndrome 03/01/2019   Type 2 diabetes mellitus with complications (HCC) 08/31/2018   Vitamin D  deficiency 08/31/2018   Chronic migraine without aura, with intractable migraine, so stated, with status migrainosus 11/10/2017   Hyperlipidemia associated with type 2 diabetes mellitus (HCC) 12/12/2015   Obstructive sleep apnea syndrome 10/10/2014   Gastroesophageal reflux disease with hiatal hernia 09/29/2014   Insomnia 07/17/2014    Past Surgical History:  Procedure Laterality Date   ABDOMINAL HYSTERECTOMY     no h/o abnormal pap still has 1 ovary left   ANKLE ARTHROSCOPY Left 05/29/2016   Procedure: ANKLE ARTHROSCOPY;  Surgeon: Janit Thresa HERO, DPM;  Location: [facility];  Service: Podiatry;  Laterality: Left;   APPENDECTOMY     basal cell removed     CESAREAN SECTION N/A    1985 & 1995   CHOLECYSTECTOMY     CLOSED MANIPULATION SHOULDER WITH STERIOD INJECTION Right 03/31/2022   Procedure: MANIPULATION UNDER ANESTHESIA WITH GLENOHUMERAL AND SUBACROMIAL CORTICOSTERIOD INJECTION;  Surgeon: Tobie Priest, MD;  Location: [facility];  Service: Orthopedics;  Laterality: Right;   COLONOSCOPY WITH PROPOFOL  N/A 01/31/2019   Procedure: COLONOSCOPY WITH PROPOFOL ;  Surgeon: Unk Corinn Skiff, MD;  Location: [facility];  Service: Gastroenterology;  Laterality: N/A;   cyst removed from wrist     ESOPHAGOGASTRODUODENOSCOPY (EGD) WITH PROPOFOL  N/A 01/31/2019   Procedure: ESOPHAGOGASTRODUODENOSCOPY (EGD) WITH PROPOFOL ;  Surgeon: Unk Corinn Skiff, MD;  Location: [facility];  Service: Gastroenterology;  Laterality: N/A;   ESOPHAGOGASTRODUODENOSCOPY (EGD) WITH PROPOFOL  N/A 05/20/2022   Procedure:  ESOPHAGOGASTRODUODENOSCOPY (EGD) WITH PROPOFOL ;  Surgeon: Unk Corinn Skiff, MD;  Location: [facility];  Service: Gastroenterology;  Laterality: N/A;   ESOPHAGOGASTRODUODENOSCOPY (EGD) WITH PROPOFOL  N/A 09/24/2022   Procedure: ESOPHAGOGASTRODUODENOSCOPY (EGD) WITH PROPOFOL ;  Surgeon: Unk Corinn Skiff, MD;  Location: [facility] END[facility]COPY;  Service: Gastroenterology;  Laterality: N/A;   FLEXIBLE SIGMOIDOSCOPY N/A 05/21/2020   Procedure: FLEXIBLE SIGMOIDOSCOPY;  Surgeon: Unk Corinn Skiff, MD;  Location: [facility];  Service: Gastroenterology;  Laterality: N/A;   FOOT SURGERY     left foot 11/2016 Dr. Rainell Janit    left elbow surgery     07/2017 Dr Helayne Glenn epicondylitis    LYSIS OF ADHESION Right 08/27/2021   Procedure: Right shoulder arthroscopic lysis of adhesions, capsular release, and manipulation under anesthesia with corticosteroid injection and biceps tenodesis;  Surgeon: Tobie Priest, MD;  Location: [facility];  Service: Orthopedics;  Laterality: Right;   MOHS SURGERY     x 2 face nose and left cheek    OSTEOCHONDROMA EXCISION Left 05/29/2016   Procedure: OSTEOCHONDRAL DRILLING TALUS;  Surgeon: Janit Thresa HERO, DPM;  Location: [facility];  Service: Podiatry;  Laterality: Left;   PLANTAR FASCIA RELEASE Left 05/29/2016   Procedure: ENDOSCOPIC PLANTAR FASCIOTOMY;  Surgeon: Janit Thresa HERO, DPM;  Location: [facility];  Service: Podiatry;  Laterality: Left;   ROTATOR CUFF REPAIR Left    SHOULDER ARTHROSCOPY WITH BICEPSTENOTOMY Right 03/31/2022   Procedure: SHOULDER ARTHROSCOPY WITH BICEPS TENOTOMY, ARTHROSCOPIC LYSIS OF ADHESIONS, AND CAPSULAR RELEASE, AND EXTENSIVE GLENOHUMERAL DEBRIDEMENT;  Surgeon: Tobie Priest, MD;  Location: [facility];  Service: Orthopedics;  Laterality: Right;   SHOULDER ARTHROSCOPY WITH ROTATOR CUFF REPAIR  03/31/2022   Procedure: SHOULDER ARTHROSCOPY WITH ROTATOR CUFF REPAIR;  Surgeon: Tobie Priest, MD;  Location: [facility];  Service: Orthopedics;;   STOMACH  SURGERY  2017   gastric sleeve surgery   TENNIS ELBOW RELEASE/NIRSCHEL PROCEDURE Left 08/05/2017   Procedure: TENNIS ELBOW RELEASE/NIRSCHEL PROCEDURE;  Surgeon: Maryl Barters, MD;  Location: [facility];  Service: Orthopedics;  Laterality: Left;   TUBAL LIGATION     wedge resection of ovary  1982    OB History     Gravida  3   Para  3   Term  3   Preterm      AB      Living  3      SAB      IAB      Ectopic      Multiple      Live Births  3            Home Medications    Prior to Admission medications   Medication Sig Start Date End Date Taking? Authorizing Provider  buPROPion  (WELLBUTRIN ) 75 MG tablet Take 1 tablet (75 mg total) by mouth in the morning. 04/30/23  Yes Eappen, Saramma, MD  citalopram  (CELEXA ) 40 MG tablet Take 1 tablet (40 mg total) by mouth daily. 04/30/23  Yes Eappen, Saramma, MD  clonazePAM  (KLONOPIN ) 0.5 MG tablet Take 0.5-1 tablets (0.25-0.5 mg total) by mouth daily as needed for anxiety. Limited supply 04/30/23  Yes Eappen, Saramma, MD  fexofenadine  (ALLEGRA ) 180 MG tablet Take 1 tablet (180 mg total) by mouth daily as needed for allergies or rhinitis. 06/30/23  Yes Hope Merle, MD  gabapentin  (NEURONTIN ) 300 MG capsule TAKE 1 CAPSULE BY MOUTH 3 TIMES DAILY AS NEEDED. 06/05/23  Yes Lomax, Amy, NP  hydrOXYzine  (ATARAX ) 25 MG tablet TAKE 0.5-1 TABLETS (12.5-25 MG TOTAL) BY MOUTH 3 (THREE) TIMES DAILY AS NEEDED FOR ANXIETY (AND SLEEP). 01/20/23  Yes Eappen, Saramma, MD  montelukast  (SINGULAIR ) 10 MG tablet TAKE 1 TABLET BY MOUTH EVERYDAY AT BEDTIME 04/06/23  Yes Hope Merle, MD  omeprazole  (PRILOSEC) 40 MG capsule Take 1 capsule (40 mg total) by mouth daily. 07/07/22  Yes Vanga, Corinn Skiff, MD  ondansetron  (ZOFRAN -ODT) 4 MG disintegrating tablet Take 1 tablet (4 mg total) by mouth every 8 (eight) hours as needed. for nausea 03/05/22  Yes Lomax, Amy, NP  Rimegepant Sulfate (NURTEC) 75 MG TBDP Take 1 tablet (75 mg total) by mouth daily as needed (take  for abortive therapy of migraine, no more than 1 tablet in 24 hours or 10 per month). 10/22/22  Yes Lomax, Amy, NP  Roflumilast (ZORYVE) 0.3 % CREA Apply topically to dry patches at face once daily prn 07/06/23  Yes Jackquline Sawyer, MD  rosuvastatin  (CRESTOR ) 20 MG tablet Take 1 tablet (20 mg total) by mouth daily. 06/30/23  Yes Hope Merle, MD  tirzepatide  (MOUNJARO ) 2.5 MG/0.5ML Pen Every other week 06/30/23  Yes Hope Merle, MD  topiramate  (TOPAMAX ) 50 MG tablet Take 1 tablet (50 mg total) by mouth daily. 10/22/22  Yes Lomax, Amy, NP  traMADol  (ULTRAM ) 50 MG tablet Take 1 tablet (50 mg total) by mouth every 8 (eight) hours as needed for up to 3 days (pain). 07/23/23 07/26/23 Yes Jarah Pember, Jodi R, NP  zolpidem  (AMBIEN  CR) 6.25 MG CR tablet Take 2 tablets (12.5 mg total) by mouth at bedtime as needed for sleep. 06/30/23  Yes Hope Merle, MD    Family History Family History  Problem Relation Age of Onset   Heart disease Mother    Breast cancer Mother 47       metastatic, now hip, spine, and leg    Arthritis Mother    Hyperlipidemia Mother    Cancer Mother        spinal surgery. breast dx'ed age 59 now metastatic 48 as of 07/13/18   Cystic fibrosis Mother        carrier    Heart disease Father    Lung cancer Father        smoker   Hyperlipidemia Father    Hypertension Father    Depression Sister    Diabetes Sister    Stroke Sister    Other Sister        covid +   Diabetes Sister    Diabetes Maternal Aunt    Alcohol abuse Maternal Aunt    Diabetes Maternal Aunt    Alcohol abuse Maternal Uncle    Diabetes Maternal Uncle    Alcohol abuse Paternal Uncle    Diabetes Maternal Grandfather    Hyperlipidemia Maternal Grandfather    Arthritis Maternal Grandmother    Sudden death Cousin    Schizophrenia Cousin    Cystic fibrosis Other        great niece    Factor V Leiden deficiency Other        uncle and cousins and m GF (all maternal)   Alcohol abuse Niece    Alcohol abuse Nephew    ADD /  ADHD Son    Depression Son    ADD / ADHD Son    Post-traumatic stress disorder Son    Colon cancer Neg Hx    Ovarian cancer Neg Hx     Social History Social History   Tobacco Use   Smoking status: Never   Smokeless tobacco: Never  Vaping Use   Vaping status: Never Used  Substance Use Topics   Alcohol use: Never   Drug use: Never     Allergies   Tetracyclines & related, Amoxicillin , Alcohol, Azithromycin , and Metoprolol    Review of Systems Review of Systems  Musculoskeletal:        Left foot pain  Skin:        Abrasion to forearm     Physical Exam Triage Vital Signs ED Triage Vitals  Encounter Vitals Group     BP 07/23/23 1742 108/74     Girls Systolic BP Percentile --      Girls Diastolic BP Percentile --      Boys Systolic BP Percentile --      Boys Diastolic BP Percentile --      Pulse Rate 07/23/23 1742 79     Resp --      Temp 07/23/23 1742  98.1 F (36.7 C)     Temp Source 07/23/23 1742 Oral     SpO2 07/23/23 1742 98 %     Weight 07/23/23 1741 125 lb (56.7 kg)     Height 07/23/23 1741 5' 2 (1.575 m)     Head Circumference --      Peak Flow --      Pain Score 07/23/23 1737 6     Pain Loc --      Pain Education --      Exclude from Growth Chart --    No data found.  Updated Vital Signs BP 108/74 (BP Location: Left Arm)   Pulse 79   Temp 98.1 F (36.7 C) (Oral)   Ht 5' 2 (1.575 m)   Wt 125 lb (56.7 kg)   SpO2 98%   BMI 22.86 kg/m   Visual Acuity Right Eye Distance:   Left Eye Distance:   Bilateral Distance:    Right Eye Near:   Left Eye Near:    Bilateral Near:     Physical Exam Vitals and nursing note reviewed.  Constitutional:      General: She is not in acute distress.    Appearance: Normal appearance. She is not ill-appearing.  HENT:     Head: Normocephalic and atraumatic.  Eyes:     Pupils: Pupils are equal, round, and reactive to light.  Cardiovascular:     Rate and Rhythm: Normal rate.  Pulmonary:     Effort:  Pulmonary effort is normal.  Musculoskeletal:       Feet:  Feet:     Comments: There is no swelling or ecchymosis of the left foot.  Tender to palpation to the distal 4th and 5th metatarsals that extends to the mid dorsum of foot.  No tenderness to medial aspect of foot or to ankle.  Reduced range of motion with flexion extension due to pain.  Cap refill +2 in all digits. Skin:    General: Skin is warm and dry.         Comments: There are 2 small abrasion/skin tears to the left forearm.  No swelling, drainage, warmth.  Neurological:     General: No focal deficit present.     Mental Status: She is alert and oriented to person, place, and time.  Psychiatric:        Mood and Affect: Mood normal.        Behavior: Behavior normal.      UC Treatments / Results  Labs (all labs ordered are listed, but only abnormal results are displayed) Labs Reviewed - No data to display  EKG   Radiology No results found.  Procedures Procedures (including critical care time)  Medications Ordered in UC Medications - No data to display  Initial Impression / Assessment and Plan / UC Course  I have reviewed the triage vital signs and the nursing notes.  Pertinent labs & imaging results that were available during my care of the patient were reviewed by me and considered in my medical decision making (see chart for details).     Wounds were cleaned and dressed by nursing staff.  Patient is up-to-date on her tetanus. Discussed wound care.  Wet read of x-ray shows fracture proximal fifth toe fracture.  Discussed with patient.  Toes were buddy taped and patient placed in postop shoe.  Discussed RICE therapy.  Rx tramadol  as needed for pain.(Original rx sent with amount error. I contacted [facility] and spoke with pharmacist and cancelled  this rx and new one was sent electronically with correct amount).  Patient was instructed not to take Ambien  if she is currently taking the pain medication.  She states  she only takes Klonopin  for flights but she was instructed not to take this if she is going to take tramadol  due to oversedation/respiratory depression and she verbalized understanding.  Advised PCP follow-up in 1 week for recheck.  ER precautions reviewed and patient verbalized understanding. Final Clinical Impressions(s) / UC Diagnoses   Final diagnoses:  Left foot pain  Abrasion of left forearm, initial encounter  Closed displaced fracture of proximal phalanx of lesser toe of left foot, initial encounter     Discharge Instructions      Keep your abrasions clean and dry.  Keep your fifth toe taped to your fourth toe to help support the area.  You may use a postop shoe as needed.  May still elevate and ice as needed.  Tramadol  as needed for pain.  If you are taking the tramadol  do not take your Ambien  as this can cause oversedation and respiratory depression.  Also do not take Klonopin  if you are taking tramadol  as it can cause affect.  Please go to the ER if you develop any worsening symptoms which include but are not limited to uncontrolled pain or swelling, persistent numbness or tingling, or any new concerns that arise.  Please follow-up with your PCP in 1 week for recheck.  I hope you feel better soon!     ED Prescriptions     Medication Sig Dispense Auth. Provider   traMADol  (ULTRAM ) 50 MG tablet  (Status: Discontinued) Take 1 tablet (50 mg total) by mouth every 6 (six) hours as needed. 15 tablet Esias Mory, Jodi R, NP   traMADol  (ULTRAM ) 50 MG tablet Take 1 tablet (50 mg total) by mouth every 8 (eight) hours as needed for up to 3 days (pain). 9 tablet Ranard Harte, Jodi R, NP      I have reviewed the PDMP during this encounter.   Loreda Myla SAUNDERS, NP 07/23/23 1833    Loreda Myla SAUNDERS, NP 07/23/23 [phone]

## 2023-07-24 ENCOUNTER — Ambulatory Visit (HOSPITAL_COMMUNITY): Payer: Self-pay

## 2023-07-28 ENCOUNTER — Telehealth (INDEPENDENT_AMBULATORY_CARE_PROVIDER_SITE_OTHER): Admitting: Psychiatry

## 2023-07-28 DIAGNOSIS — F411 Generalized anxiety disorder: Secondary | ICD-10-CM

## 2023-07-28 NOTE — Progress Notes (Signed)
 Unable to complete evaluation due to patient having network problem.  I have communicated with staff to reschedule this appointment.

## 2023-07-29 ENCOUNTER — Encounter: Payer: Self-pay | Admitting: Psychiatry

## 2023-07-29 ENCOUNTER — Telehealth (INDEPENDENT_AMBULATORY_CARE_PROVIDER_SITE_OTHER): Admitting: Psychiatry

## 2023-07-29 DIAGNOSIS — F3341 Major depressive disorder, recurrent, in partial remission: Secondary | ICD-10-CM

## 2023-07-29 DIAGNOSIS — F3342 Major depressive disorder, recurrent, in full remission: Secondary | ICD-10-CM

## 2023-07-29 DIAGNOSIS — G4700 Insomnia, unspecified: Secondary | ICD-10-CM | POA: Diagnosis not present

## 2023-07-29 DIAGNOSIS — F411 Generalized anxiety disorder: Secondary | ICD-10-CM

## 2023-07-29 DIAGNOSIS — R41840 Attention and concentration deficit: Secondary | ICD-10-CM

## 2023-07-29 DIAGNOSIS — Z634 Disappearance and death of family member: Secondary | ICD-10-CM

## 2023-07-29 NOTE — Progress Notes (Unsigned)
 Virtual Visit via Video Note  I connected with Kristin [name] on 07/29/23 at  3:30 PM EDT by a video enabled telemedicine application and verified that I am speaking with the correct person using two identifiers.  Location Provider Location : [facility] Patient Location : Home  Participants: Patient , Provider    I discussed the limitations of evaluation and management by telemedicine and the availability of in person appointments. The patient expressed understanding and agreed to proceed.   I discussed the assessment and treatment plan with the patient. The patient was provided an opportunity to ask questions and all were answered. The patient agreed with the plan and demonstrated an understanding of the instructions.   The patient was advised to call back or seek an in-person evaluation if the symptoms worsen or if the condition fails to improve as anticipated.    [facility] MD OP Progress Note  07/29/2023 3:47 PM Kristin [name]  MRN:  [id number]  Chief Complaint:  Chief Complaint  Patient presents with   Follow-up   Depression   Anxiety   Medication Refill   Discussed the use of AI scribe software for clinical note transcription with the patient, who gave verbal consent to proceed.  History of Present Illness Kristin [name] is a 63 year old Caucasian female, married, retired, lives in [place], has a history of anxiety, depression, insomnia, diabetes mellitus, polyneuropathy, tendinopathy of right biceps tendon, status post right shoulder arthropathy with repair of subscapularis and supraspinatus, subacromial decompression with extensive debridement of shoulder and distal clavicle resection, chronic migraine was evaluated by telemedicine today.  She experienced a fall last Thursday, resulting in a broken toe in three places. The incident occurred when she reached for her dog, lost her balance, and hit the fireplace. She is currently using a surgical shoe and her  toes are taped together.  She recently went on a cruise with her sister and had a good time, experiencing only one uncomfortable day at the airport due to the crowd. She is planning another trip to [place]  at the end of July for her mother's birthday, where she will be with family and engage in activities to honor her mother.  She is managing her mood symptoms well, coping with grief, and managing anxiety in crowds by using tactics to find less crowded areas. She uses clonazepam  sparingly, primarily during travel, and has taken it three times recently during her last trip. She also uses hydrocodone  for pain management, taking it before attending events like theater shows.  She continues to experience issues with concentration, describing 'twenty seven thousand conversations going on in my head' and difficulty completing tasks. She is frustrated with starting multiple tasks and not finishing them, impacting her ability to focus on activities like writing and puzzles.  Her current medications include Wellbutrin  , Celexa , clonazepam  as needed, hydroxyzine , and Ambien  for sleep. No changes to her medications, allergies, or medical problems since the last visit.  She denies any side effects to her medications.  Denies any suicidality, homicidality or perceptual disturbances.    Visit Diagnosis:    ICD-10-CM   1. GAD (generalized anxiety disorder)  F41.1     2. Recurrent major depressive disorder, in full remission (HCC)  F33.42     3. Insomnia, unspecified type  G47.00     4. Bereavement  Z63.4     5. Attention and concentration deficit  R41.840 Ambulatory referral to Neuropsychology      Past Psychiatric History: I have  reviewed past psychiatric history from progress note on 04/03/2022.  Past trials of medications like Prozac , trazodone , nortriptyline , amitriptyline  for headaches, neuropathy.  Patient had ADHD testing completed at [facility]-no formal diagnosis of ADHD at  that time.  She may need reevaluation once anxiety symptoms manageable.  Past Medical History:  Past Medical History:  Diagnosis Date   ADHD    Adhesive capsulitis of right shoulder 07/23/2020   Allergy     Annual physical exam 08/19/2019   Anxiety 12/12/2015   Arthritis    Basal cell carcinoma    nose, left cheek, right upper lip, right posterior shoulder most recent 6 years ago on left cheek   Basal cell carcinoma 03/26/2021   L chest, EDC 04/24/2021   Basal cell carcinoma 07/06/2023   L pretibial, EDC   Benign essential hypertension 08/14/2014   Bleeding    Bursitis of right shoulder 12/25/2020   Chicken pox    Chronic diarrhea    Chronic intractable headache 10/20/2017   Chronic migraine w/o aura, not intractable, w/o stat migr 08/08/2020   Complication of anesthesia    blood pressure elevated after gastric sleeve surgery in recovery, pt does not want mask over face; migraines   COVID-19    03/01/20 had MAB 03/03/20 and felt better next day 03/04/20   CTS (carpal tunnel syndrome) 04/05/2020   Dr. Maree s/p [facility]/Lidocaine  injection 12/05/19   Depression    Diabetes mellitus without complication (HCC)    pre-diabetic currently, was diabetic prior to gastric sleeve surgery    Diabetic polyneuropathy associated with diabetes mellitus due to underlying condition (HCC) 08/08/2020   Disorder of right rotator cuff    10/03/19 MRI right shoulder abnormal Dr. Lorriane Blanch   Fall    08/2020   Fall    03/28/21   Fatty liver    Fatty liver 05/06/2017   US  + 2013    Gout    Hematuria 10/15/2018   History of shingles 06/19/2021   lower back and R side of thigh   Hives 03/28/2020   Hyperlipidemia    Hypertension    no problems since losing weight after gastric sleeve surgery   Hypertension 07/17/2014   IBS (irritable bowel syndrome)    Injury of right wrist 06/26/2016   Insomnia    Migraine    Neuropathy    bilateral feet   Obesity    Obesity (BMI 30-39.9) 05/06/2017   Obesity,  diabetes, and hypertension syndrome (HCC) 08/18/2019   Osteopenia    Palpitations 08/14/2014   PONV (postoperative nausea and vomiting)    Squamous cell carcinoma of skin 03/26/2021   SCC IS R mid sternum, EDC 04/24/2021   Tendonitis of shoulder, right 12/25/2020    Past Surgical History:  Procedure Laterality Date   ABDOMINAL HYSTERECTOMY     no h/o abnormal pap still has 1 ovary left   ANKLE ARTHROSCOPY Left 05/29/2016   Procedure: ANKLE ARTHROSCOPY;  Surgeon: Janit Thresa HERO, DPM;  Location: [facility];  Service: Podiatry;  Laterality: Left;   APPENDECTOMY     basal cell removed     CESAREAN SECTION N/A    1985 & 1995   CHOLECYSTECTOMY     CLOSED MANIPULATION SHOULDER WITH STERIOD INJECTION Right 03/31/2022   Procedure: MANIPULATION UNDER ANESTHESIA WITH GLENOHUMERAL AND SUBACROMIAL CORTICOSTERIOD INJECTION;  Surgeon: Blanch Priest, MD;  Location: [facility]S;  Service: Orthopedics;  Laterality: Right;   COLONOSCOPY WITH PROPOFOL  N/A 01/31/2019   Procedure: COLONOSCOPY WITH PROPOFOL ;  Surgeon: Unk,  Corinn Skiff, MD;  Location: [facility];  Service: Gastroenterology;  Laterality: N/A;   cyst removed from wrist     ESOPHAGOGASTRODUODENOSCOPY (EGD) WITH PROPOFOL  N/A 01/31/2019   Procedure: ESOPHAGOGASTRODUODENOSCOPY (EGD) WITH PROPOFOL ;  Surgeon: Unk Corinn Skiff, MD;  Location: [facility];  Service: Gastroenterology;  Laterality: N/A;   ESOPHAGOGASTRODUODENOSCOPY (EGD) WITH PROPOFOL  N/A 05/20/2022   Procedure: ESOPHAGOGASTRODUODENOSCOPY (EGD) WITH PROPOFOL ;  Surgeon: Unk Corinn Skiff, MD;  Location: [facility];  Service: Gastroenterology;  Laterality: N/A;   ESOPHAGOGASTRODUODENOSCOPY (EGD) WITH PROPOFOL  N/A 09/24/2022   Procedure: ESOPHAGOGASTRODUODENOSCOPY (EGD) WITH PROPOFOL ;  Surgeon: Unk Corinn Skiff, MD;  Location[facility];  Service: Gastroenterology;  Laterality: N/A;   FLEXIBLE SIGMOIDOSCOPY N/A 05/21/2020   Procedure: FLEXIBLE SIGMOIDOSCOPY;  Surgeon: Unk Corinn Skiff, MD;  [facility];  Service: Gastroenterology;  Laterality: N/A;   FOOT SURGERY     left foot 11/2016 Dr. Rainell Sar    left elbow surgery     07/2017 Dr Helayne Glenn epicondylitis    LYSIS OF ADHESION Right 08/27/2021   Procedure: Right shoulder arthroscopic lysis of adhesions, capsular release, and manipulation under anesthesia with corticosteroid injection and biceps tenodesis;  Surgeon: Tobie Priest, MD;  Location[facility];  Service: Orthopedics;  Laterality: Right;   MOHS SURGERY     x 2 face nose and left cheek    OSTEOCHONDROMA EXCISION Left 05/29/2016   Procedure: OSTEOCHONDRAL DRILLING TALUS;  Surgeon: Sar Thresa HERO, DPM;  Location[facility];  Service: Podiatry;  Laterality: Left;   PLANTAR FASCIA RELEASE Left 05/29/2016   Procedure: ENDOSCOPIC PLANTAR FASCIOTOMY;  Surgeon: Sar Thresa HERO, DPM;  [facility];  Service: Podiatry;  Laterality: Left;   ROTATOR CUFF REPAIR Left    SHOULDER ARTHROSCOPY WITH BICEPSTENOTOMY Right 03/31/2022   Procedure: SHOULDER ARTHROSCOPY WITH BICEPS TENOTOMY, ARTHROSCOPIC LYSIS OF ADHESIONS, AND CAPSULAR RELEASE, AND EXTENSIVE GLENOHUMERAL DEBRIDEMENT;  Surgeon: Tobie Priest, MD;  Location: [facility];  Service: Orthopedics;  Laterality: Right;   SHOULDER ARTHROSCOPY WITH ROTATOR CUFF REPAIR  03/31/2022   Procedure: SHOULDER ARTHROSCOPY WITH ROTATOR CUFF REPAIR;  Surgeon: Tobie Priest, MD;  Location: [facility];  Service: Orthopedics;;   STOMACH SURGERY  2017   gastric sleeve surgery   TENNIS ELBOW RELEASE/NIRSCHEL PROCEDURE Left 08/05/2017   Procedure: TENNIS ELBOW RELEASE/NIRSCHEL PROCEDURE;  Surgeon: Glenn Helayne, MD;  Location: [facility];  Service: Orthopedics;  Laterality: Left;   TUBAL LIGATION     wedge resection of ovary  1982    Family Psychiatric History: I have reviewed family psychiatric history from progress note on 04/03/2022.  Family History:  Family History  Problem Relation Age of Onset   Heart  disease Mother    Breast cancer Mother 83       metastatic, now hip, spine, and leg    Arthritis Mother    Hyperlipidemia Mother    Cancer Mother        spinal surgery. breast dx'ed age 7 now metastatic 82 as of 07/13/18   Cystic fibrosis Mother        carrier    Heart disease Father    Lung cancer Father        smoker   Hyperlipidemia Father    Hypertension Father    Depression Sister    Diabetes Sister    Stroke Sister    Other Sister        covid +   Diabetes Sister    Diabetes Maternal Aunt    Alcohol abuse Maternal Aunt  Diabetes Maternal Aunt    Alcohol abuse Maternal Uncle    Diabetes Maternal Uncle    Alcohol abuse Paternal Uncle    Diabetes Maternal Grandfather    Hyperlipidemia Maternal Grandfather    Arthritis Maternal Grandmother    Sudden death Cousin    Schizophrenia Cousin    Cystic fibrosis Other        great niece    Factor V Leiden deficiency Other        uncle and cousins and m GF (all maternal)   Alcohol abuse Niece    Alcohol abuse Nephew    ADD / ADHD Son    Depression Son    ADD / ADHD Son    Post-traumatic stress disorder Son    Colon cancer Neg Hx    Ovarian cancer Neg Hx     Social History: Reviewed social history from my progress note on 04/03/2022. Social History   Socioeconomic History   Marital status: Married    Spouse name: Not on file   Number of children: 3   Years of education: Not on file   Highest education level: Some college, no degree  Occupational History   Not on file  Tobacco Use   Smoking status: Never   Smokeless tobacco: Never  Vaping Use   Vaping status: Never Used  Substance and Sexual Activity   Alcohol use: Never   Drug use: Never   Sexual activity: Yes    Partners: Male    Birth control/protection: Surgical  Other Topics Concern   Not on file  Social History Narrative   Married x 37 years as of 10/2017    Kids 3 boys youngest is Deward   Write childrens books    Retired from [employer] 01/2017     Enjoys traveling    Right handed   Caffeine: quit 2009   Social Drivers of [employer] Strain: Low Risk  (04/14/2023)   Received from [facility]   Overall Financial Resource Strain (CARDIA)    Difficulty of Paying Living Expenses: Not very hard  Food Insecurity: No Food Insecurity (04/14/2023)   [facility] from [facility]   Hunger Vital Sign    Within the past 12 months, you worried that your food would run out before you got the money to buy more.: Never true    Within the past 12 months, the food you bought just didn't last and you didn't have money to get more.: Never true  Transportation Needs: No Transportation Needs (04/14/2023)   Received from [facility] - Transportation    In the past 12 months, has lack of transportation kept you from medical appointments or from getting medications?: No    Lack of Transportation (Non-Medical): No  Physical Activity: Inactive (01/01/2023)   Exercise Vital Sign    Days of Exercise per Week: 0 days    Minutes of Exercise per Session: 20 min  Stress: Stress Concern Present (01/01/2023)   [employer] of Occupational Health - Occupational Stress Questionnaire    Feeling of Stress : Very much  Social Connections: Moderately Isolated (01/01/2023)   Social Connection and Isolation Panel    Frequency of Communication with Friends and Family: Three times a week    Frequency of Social Gatherings with Friends and Family: Patient declined    Attends Religious Services: Never    Database administrator or Organizations: No    Attends Banker  Meetings: Not on file    Marital Status: Married    Allergies:  Allergies  Allergen Reactions   Tetracyclines & Related Swelling and Rash    FACE SWELL, RASH ON CHEST   Amoxicillin     Alcohol Rash    ABDOMINAL CRAMPS, drinking alcohol   Azithromycin  Rash   Metoprolol  Diarrhea    Metabolic Disorder Labs: Lab  Results  Component Value Date   HGBA1C 5.5 03/16/2023   No results found for: PROLACTIN Lab Results  Component Value Date   CHOL 151 07/03/2022   TRIG 70.0 07/03/2022   HDL 59.20 07/03/2022   CHOLHDL 3 07/03/2022   VLDL 14.0 07/03/2022   LDLCALC 77 07/03/2022   LDLCALC 53 01/09/2022   Lab Results  Component Value Date   TSH 3.58 03/16/2023   TSH 1.62 10/30/2022    Therapeutic Level Labs: No results found for: LITHIUM No results found for: VALPROATE No results found for: CBMZ  Current Medications: Current Outpatient Medications  Medication Sig Dispense Refill   celecoxib (CELEBREX) 200 MG capsule Take 200 mg by mouth.     buPROPion  (WELLBUTRIN ) 75 MG tablet Take 1 tablet (75 mg total) by mouth in the morning. 90 tablet 1   citalopram  (CELEXA ) 40 MG tablet Take 1 tablet (40 mg total) by mouth daily. 90 tablet 1   clonazePAM  (KLONOPIN ) 0.5 MG tablet Take 0.5-1 tablets (0.25-0.5 mg total) by mouth daily as needed for anxiety. Limited supply 10 tablet 0   fexofenadine  (ALLEGRA ) 180 MG tablet Take 1 tablet (180 mg total) by mouth daily as needed for allergies or rhinitis. 90 tablet 3   gabapentin  (NEURONTIN ) 300 MG capsule TAKE 1 CAPSULE BY MOUTH 3 TIMES DAILY AS NEEDED. 270 capsule 1   hydrOXYzine  (ATARAX ) 25 MG tablet TAKE 0.5-1 TABLETS (12.5-25 MG TOTAL) BY MOUTH 3 (THREE) TIMES DAILY AS NEEDED FOR ANXIETY (AND SLEEP). 270 tablet 1   montelukast  (SINGULAIR ) 10 MG tablet TAKE 1 TABLET BY MOUTH EVERYDAY AT BEDTIME 90 tablet 3   omeprazole  (PRILOSEC) 40 MG capsule Take 1 capsule (40 mg total) by mouth daily. 90 capsule 3   ondansetron  (ZOFRAN -ODT) 4 MG disintegrating tablet Take 1 tablet (4 mg total) by mouth every 8 (eight) hours as needed. for nausea 20 tablet 2   Rimegepant Sulfate (NURTEC) 75 MG TBDP Take 1 tablet (75 mg total) by mouth daily as needed (take for abortive therapy of migraine, no more than 1 tablet in 24 hours or 10 per month). 10 tablet 2   Roflumilast   (ZORYVE ) 0.3 % CREA Apply topically to dry patches at face once daily prn 60 g 4   rosuvastatin  (CRESTOR ) 20 MG tablet Take 1 tablet (20 mg total) by mouth daily. 90 tablet 2   tirzepatide  (MOUNJARO ) 2.5 MG/0.5ML Pen Every other week 6 mL 1   topiramate  (TOPAMAX ) 50 MG tablet Take 1 tablet (50 mg total) by mouth daily. 90 tablet 3   zolpidem  (AMBIEN  CR) 6.25 MG CR tablet Take 2 tablets (12.5 mg total) by mouth at bedtime as needed for sleep. 60 tablet 3   No current facility-administered medications for this visit.     Musculoskeletal: Strength & Muscle Tone: UTA Gait & Station: Seated Patient leans: N/A  Psychiatric Specialty Exam: Review of Systems  Psychiatric/Behavioral:  Positive for decreased concentration. The patient is nervous/anxious.     There were no vitals taken for this visit.There is no height or weight on file to calculate BMI.  General Appearance: Casual  Eye  Contact:  Fair  Speech:  Clear and Coherent  Volume:  Normal  Mood:  Anxious better managed  Affect:  Congruent  Thought Process:  Goal Directed and Descriptions of Associations: Intact  Orientation:  Full (Time, Place, and Person)  Thought Content: Logical   Suicidal Thoughts:  No  Homicidal Thoughts:  No  Memory:  Immediate;   Fair Recent;   Fair Remote;   Fair  Judgement:  Fair  Insight:  Fair  Psychomotor Activity:  Normal  Concentration:  Concentration: Fair and Attention Span: Fair  Recall:  [employer] of Knowledge: Fair  Language: Fair  Akathisia:  No  Handed:  Right  AIMS (if indicated): not done  Assets:  Manufacturing systems engineer Desire for Improvement Housing Social Support Transportation  ADL's:  Intact  Cognition: WNL  Sleep:  Fair   Screenings: GAD-7    Garment/textile technologist Visit from 06/30/2023 in [facility] Visit from 03/16/2023 in [facility] [employer] Visit from 01/28/2023 in [facility] Visit from 01[facility]01/2023 in [facility] Visit from 10/30/2022 in [facility] [employer] [facility]  Total GAD-7 Score 14 16 16 14 13    PHQ2-9    Flowsheet Row Office Visit from 06/30/2023 in [facility] Visit from 03[facility]16/2023 in [facility] [employer] Visit from 01/28/2023 in [facility] Office Visit from 01/01/2023 in [facility] [employer] Visit from 10/30/2022 in [facility] [employer]  PHQ-2 Total Score 2 3 4 4 4   PHQ-9 Total Score 12 15 16 13 15    Flowsheet Row Video Visit from 07/29/2023 in [facility] UC from 07/23/2023 in [facility]  Video Visit from 04/30/2023 in [facility]  C-SSRS RISK CATEGORY No Risk No Risk No Risk     Assessment and Plan: Kristin [name] is a 63 year old Caucasian female, married, retired, lives in [place], has a history of anxiety, depression, multiple medical problems including chronic pain, bereavement was evaluated by telemedicine today.  Discussed assessment and plan as noted below.  Major depressive disorder in remission Currently reports mood symptoms as overall well managed on the current medication regimen.  She however continues to struggle with concentration problems and inability to complete tasks.  She had ADHD testing done previously however that did not show a definitive diagnosis of ADHD. Will consider increasing the dosage of Wellbutrin  in the future. Continue Wellbutrin  75 mg daily Continue Celexa  40 mg daily. Continue CBT with Ms. [name] Hummer.  Generalized anxiety disorder-improving Currently anxiety symptoms are better managed on the  current medication regimen. Continue Clonazepam  0.25-0.5 mg as needed for situational anxiety. Continue Hydroxyzine  25 mg twice a day as needed Reviewed [place] PMP AWARxE  Insomnia-improving Currently denies any significant sleep problems. Continue Ambien  12.5 mg at bedtime as needed  Bereavement-improving Currently coping better with grief. Continue CBT with Ms. [name] Hummer  Attention and concentration deficit-unstable Previously had ADHD testing at [facility].  That did not show a definitive diagnosis of ADHD.  She was advised to get reevaled once mood symptoms more controlled.  Currently she reports anxiety and depression as well as sleep as well managed on the current medication regimen.  She is interested  in referral for another testing. Ambulatory referral to neuropsychologist-Dr. Rodenbough.  Follow-up Follow-up in clinic in 2 months or sooner if needed.    Collaboration of Care: Collaboration of Care: Other referred to Dr. Corina for diagnostic clarification since she continues to have attention and focus deficit.  Patient/Guardian was advised Release of Information must be obtained prior to any record release in order to collaborate their care with an outside provider. Patient/Guardian was advised if they have not already done so to contact the registration department to sign all necessary forms in order for us  to release information regarding their care.   Consent: Patient/Guardian gives verbal consent for treatment and assignment of benefits for services provided during this visit. Patient/Guardian expressed understanding and agreed to proceed.  This note was generated in part or whole with voice recognition software. Voice recognition is usually quite accurate but there are transcription errors that can and very often do occur. I apologize for any typographical errors that were not detected and corrected.     Damary Doland, MD 07/30/2023, 8:48 AM

## 2023-08-21 ENCOUNTER — Encounter: Payer: Self-pay | Admitting: Psychology

## 2023-08-21 DIAGNOSIS — F901 Attention-deficit hyperactivity disorder, predominantly hyperactive type: Secondary | ICD-10-CM | POA: Diagnosis not present

## 2023-08-21 DIAGNOSIS — F331 Major depressive disorder, recurrent, moderate: Secondary | ICD-10-CM | POA: Diagnosis not present

## 2023-08-21 DIAGNOSIS — F411 Generalized anxiety disorder: Secondary | ICD-10-CM | POA: Diagnosis not present

## 2023-08-26 DIAGNOSIS — M5416 Radiculopathy, lumbar region: Secondary | ICD-10-CM | POA: Diagnosis not present

## 2023-08-26 DIAGNOSIS — M5442 Lumbago with sciatica, left side: Secondary | ICD-10-CM | POA: Diagnosis not present

## 2023-08-26 DIAGNOSIS — M47816 Spondylosis without myelopathy or radiculopathy, lumbar region: Secondary | ICD-10-CM | POA: Diagnosis not present

## 2023-08-26 DIAGNOSIS — M25552 Pain in left hip: Secondary | ICD-10-CM | POA: Diagnosis not present

## 2023-09-14 ENCOUNTER — Ambulatory Visit: Payer: Self-pay

## 2023-09-14 DIAGNOSIS — M25552 Pain in left hip: Secondary | ICD-10-CM | POA: Diagnosis not present

## 2023-09-14 DIAGNOSIS — G8929 Other chronic pain: Secondary | ICD-10-CM | POA: Diagnosis not present

## 2023-09-14 NOTE — Telephone Encounter (Signed)
 FYI Only or Action Required?: FYI only for provider.  Patient was last seen in primary care on 06/30/2023 by Hope Merle, MD.  Called Nurse Triage reporting Sore Throat, Urinary Incontinence, and Nasal Congestion.  Symptoms began several months ago.  Interventions attempted: OTC medications: Allegra  and Prescription medications: singulair .  Symptoms are: allergies (sneezing, nasal congestion, sore throat, hoarse voice,mild cough), urinary incontinence gradually worsening.  Triage Disposition: See Physician Within 24 Hours  Patient/caregiver understands and will follow disposition?: Yes              Summary: Allergy  flare up / bladder leak   Reason for Triage: Patient was seeing Dr. Hope and now has her TOC appt. in February. Patient experiencing an allergy  flare up, runny nose, congestion, and a sore throat, symptoms going on for 3 weeks. Patient is also having bladder leak which has been going on all summer.         Reason for Disposition  [1] Can't control passage of urine (i.e., urinary incontinence) AND [2] new-onset (< 2 weeks) or worsening  [1] Taking antihistamines or using nasal steroids > 2 days AND [2] nasal allergy  symptoms interfere with sleep, school, or work  Answer Assessment - Initial Assessment Questions 1. SYMPTOM: What's the main symptom you're concerned about? (e.g., frequency, incontinence)     Incontinence (worse at night).  2. ONSET: When did the  incontinence  start?     3-4 months.  3. PAIN: Is there any pain? If Yes, ask: How bad is it? (Scale: 1-10; mild, moderate, severe)     No.  4. CAUSE: What do you think is causing the symptoms?     She has had 3 children and states in the past she would have a drop here or there but it has increased/become more significant now.  5. OTHER SYMPTOMS: Do you have any other symptoms? (e.g., blood in urine, fever, flank pain, pain with urination)     Urinary hesitancy. Denies pain with  urination, blood in urine, fever, flank pain or new back pain.  6. PREGNANCY: Is there any chance you are pregnant? When was your last menstrual period?     N/A.  Answer Assessment - Initial Assessment Questions 1. SYMPTOM: What's the main symptom you're concerned about? (e.g., runny nose, stuffiness, sneezing, itching)     Post nasal drip, sore throat, hoarse voice, runny nose, sneezing.  2. SEVERITY: How bad is it? What does it keep you from doing? (e.g., sleeping, working)      Moderate.  3. EYES: Are your eyes also red, watery, and itchy?      Itchy eyes, she states she gets crust in her eyes in the morning. No watery or red eyes.  4. TRIGGER: What pollen or other allergic substance do you think is causing the symptoms?      Pollen.  5. TREATMENT: What medicine are you using? What medicine worked best in the past?     Allegra  in the morning and Singulair  in the evening.  6. OTHER SYMPTOMS: Do you have any other symptoms? (e.g., coughing, difficulty breathing, wheezing)    Little cough, sometimes she wakes up nauseated from the post nasal drip. Denies sinus pain/facial pain, fever, difficulty breathing.  7. PREGNANCY: Is there any chance you are pregnant? When was your last menstrual period?     N/A.  Protocols used: Urinary Symptoms-A-AH, Nasal Allergies (Hay Fever)-A-AH

## 2023-09-14 NOTE — Telephone Encounter (Signed)
  1st attempt, [email or web address].   Message from [employer] E sent at 09/14/2023 11:57 AM EDT  Summary: Allergy  flare up / bladder leak   Reason for Triage: Patient was seeing Dr. Hope and now has her TOC appt. in February. Patient experiencing an allergy  flare up, runny nose, congestion, and a sore throat, symptoms going on for 3 weeks. Patient is also having bladder leak which has been going on all summer.

## 2023-09-15 ENCOUNTER — Encounter: Payer: Self-pay | Admitting: Family

## 2023-09-15 ENCOUNTER — Ambulatory Visit: Admitting: Family

## 2023-09-15 VITALS — BP 112/64 | HR 59 | Temp 98.2°F | Resp 20 | Ht 62.0 in | Wt 128.1 lb

## 2023-09-15 DIAGNOSIS — N393 Stress incontinence (female) (male): Secondary | ICD-10-CM | POA: Diagnosis not present

## 2023-09-15 DIAGNOSIS — E118 Type 2 diabetes mellitus with unspecified complications: Secondary | ICD-10-CM | POA: Diagnosis not present

## 2023-09-15 DIAGNOSIS — J309 Allergic rhinitis, unspecified: Secondary | ICD-10-CM

## 2023-09-15 DIAGNOSIS — B3731 Acute candidiasis of vulva and vagina: Secondary | ICD-10-CM

## 2023-09-15 LAB — POCT URINALYSIS DIPSTICK
Bilirubin, UA: NEGATIVE
Glucose, UA: NEGATIVE
Ketones, UA: NEGATIVE
Leukocytes, UA: NEGATIVE
Nitrite, UA: NEGATIVE
Protein, UA: POSITIVE — AB
Spec Grav, UA: 1.02 (ref 1.010–1.025)
Urobilinogen, UA: 1 U/dL
pH, UA: 8.5 — AB (ref 5.0–8.0)

## 2023-09-15 MED ORDER — LEVOCETIRIZINE DIHYDROCHLORIDE 5 MG PO TABS: 5.0000 mg | ORAL_TABLET | Freq: Every evening | ORAL | 1 refills | Status: DC

## 2023-09-15 MED ORDER — MOUNJARO 2.5 MG/0.5ML ~~LOC~~ SOAJ: SUBCUTANEOUS | 1 refills | Status: AC

## 2023-09-15 MED ORDER — SOLIFENACIN SUCCINATE 5 MG PO TABS: 5.0000 mg | ORAL_TABLET | Freq: Every day | ORAL | 1 refills | Status: AC

## 2023-09-15 MED ORDER — FLUCONAZOLE 150 MG PO TABS: 150.0000 mg | ORAL_TABLET | ORAL | 0 refills | Status: AC

## 2023-09-15 NOTE — Progress Notes (Signed)
 Acute Office Visit  Subjective:     Patient ID: Kristin [name], female    DOB: [birth year], 63 y.o.   MRN: [id number]  Chief Complaint  Patient presents with  . Urinary Incontinence    Had for a while and getting worse.  . Allergies    Takes 2 different medication     HPI Patient is in today with c/o allergies not being well controlled anymore. She has ben taking Singulair  and Claritin for years that has worked but over the last few months, it has not helped. Congestion and post nasal drip are worse at night. Has not tried anything different at this point. No environmental changes.   Patient also has concerns of stress incontinence that typically occurs with laughing, coughing or sneezing. Recently has noticed urinary leakage at night while she is sleeping. She reports vaginal chaffing and using Vaseline to help. Is interested in medication to help her symptoms. She has had 3 vaginal deliveries.   Review of Systems  Constitutional: Negative.   HENT:  Positive for congestion.        Post nasal drip  Respiratory:  Positive for cough and shortness of breath. Negative for wheezing.   Cardiovascular: Negative.   Gastrointestinal: Negative.   Genitourinary:  Positive for frequency.       Stress incontinence and urinary leakage  Musculoskeletal: Negative.   Skin:        Vaginal chaffing  Neurological: Negative.   Endo/Heme/Allergies: Negative.   Psychiatric/Behavioral: Negative.    All other systems reviewed and are negative.  Past Medical History:  Diagnosis Date  . ADHD   . Adhesive capsulitis of right shoulder 07/23/2020  . Allergy    . Annual physical exam 08/19/2019  . Anxiety 12/12/2015  . Arthritis   . Basal cell carcinoma    nose, left cheek, right upper lip, right posterior shoulder most recent 6 years ago on left cheek  . Basal cell carcinoma 03/26/2021   L chest, [facility] 04/24/2021  . Basal cell carcinoma 07/06/2023   L pretibial, [facility]  . Benign essential  hypertension 08/14/2014  . Bleeding   . Bursitis of right shoulder 12/25/2020  . Chicken pox   . Chronic diarrhea   . Chronic intractable headache 10/20/2017  . Chronic migraine w/o aura, not intractable, w/o stat migr 08/08/2020  . Complication of anesthesia    blood pressure elevated after gastric sleeve surgery in recovery, pt does not want mask over face; migraines  . COVID-19    03/01/20 had MAB 03/03/20 and felt better next day 03/04/20  . CTS (carpal tunnel syndrome) 04/05/2020   Dr. Maree s/p [facility]/Lidocaine  injection 12/05/19  . Depression   . Diabetes mellitus without complication (HCC)    pre-diabetic currently, was diabetic prior to gastric sleeve surgery   . Diabetic polyneuropathy associated with diabetes mellitus due to underlying condition (HCC) 08/08/2020  . Disorder of right rotator cuff    10/03/19 MRI right shoulder abnormal Dr. Lorriane Blanch  . Fall    08/2020  . Fall    03/28/21  . Fatty liver   . Fatty liver 05/06/2017   US  + 2013   . Gout   . Hematuria 10/15/2018  . History of shingles 06/19/2021   lower back and R side of thigh  . Hives 03/28/2020  . Hyperlipidemia   . Hypertension    no problems since losing weight after gastric sleeve surgery  . Hypertension 07/17/2014  . IBS (irritable bowel syndrome)   .  Injury of right wrist 06/26/2016  . Insomnia   . Migraine   . Neuropathy    bilateral feet  . Obesity   . Obesity (BMI 30-39.9) 05/06/2017  . Obesity, diabetes, and hypertension syndrome (HCC) 08/18/2019  . Osteopenia   . Palpitations 08/14/2014  . PONV (postoperative nausea and vomiting)   . Squamous cell carcinoma of skin 03/26/2021   SCC IS R mid sternum, [facility] 04/24/2021  . Tendonitis of shoulder, right 12/25/2020    Social History   Socioeconomic History  . Marital status: Married    Spouse name: Not on file  . Number of children: 3  . Years of education: Not on file  . Highest education level: 12th grade  Occupational History  . Not on  file  Tobacco Use  . Smoking status: Never  . Smokeless tobacco: Never  Vaping Use  . Vaping status: Never Used  Substance and Sexual Activity  . Alcohol use: Never  . Drug use: Never  . Sexual activity: Yes    Partners: Male    Birth control/protection: Surgical  Other Topics Concern  . Not on file  Social History Narrative   Married x 37 years as of 10/2017    Kids 3 boys youngest is Deward   Write childrens books    Retired from [employer] 01/2017    Enjoys traveling    Right handed   Caffeine: quit 2009   Social Drivers of [employer] Strain: Low Risk  (09/14/2023)   Overall Financial Resource Strain (CARDIA)   . Difficulty of Paying Living Expenses: Not very hard  Food Insecurity: No Food Insecurity (09/14/2023)   Hunger Vital Sign   . Worried About Programme researcher, broadcasting/film/video in the Last Year: Never true   . Ran Out of Food in the Last Year: Never true  Transportation Needs: Unmet Transportation Needs (09/14/2023)   PRAPARE - Transportation   . Lack of Transportation (Medical): Yes   . Lack of Transportation (Non-Medical): No  Physical Activity: Insufficiently Active (09/14/2023)   Exercise Vital Sign   . Days of Exercise per Week: 1 day   . Minutes of Exercise per Session: 30 min  Stress: Stress Concern Present (09/14/2023)   [employer] of Occupational Health - Occupational Stress Questionnaire   . Feeling of Stress: Rather much  Social Connections: Moderately Isolated (09/14/2023)   Social Connection and Isolation Panel   . Frequency of Communication with Friends and Family: More than three times a week   . Frequency of Social Gatherings with Friends and Family: Once a week   . Attends Religious Services: Patient declined   . Active Member of Clubs or Organizations: No   . Attends Banker Meetings: Not on file   . Marital Status: Married  Catering manager Violence: Not on file    Past Surgical History:  Procedure Laterality Date  .  ABDOMINAL HYSTERECTOMY     no h/o abnormal pap still has 1 ovary left  . ANKLE ARTHROSCOPY Left 05/29/2016   Procedure: ANKLE ARTHROSCOPY;  Surgeon: Janit Thresa HERO, DPM;  Location: [facility] OR;  Service: Podiatry;  Laterality: Left;  . APPENDECTOMY    . basal cell removed    . CESAREAN SECTION N/A    1985 & 1995  . CHOLECYSTECTOMY    . CLOSED MANIPULATION SHOULDER WITH STERIOD INJECTION Right 03/31/2022   Procedure: MANIPULATION UNDER ANESTHESIA WITH GLENOHUMERAL AND SUBACROMIAL CORTICOSTERIOD INJECTION;  Surgeon: Tobie Priest, MD;  Location: [facility] ORS;  Service: Orthopedics;  Laterality: Right;  . COLONOSCOPY WITH PROPOFOL  N/A 01/31/2019   Procedure: COLONOSCOPY WITH PROPOFOL ;  Surgeon: Unk Corinn Skiff, MD;  Location: [facility];  Service: Gastroenterology;  Laterality: N/A;  . cyst removed from wrist    . ESOPHAGOGASTRODUODENOSCOPY (EGD) WITH PROPOFOL  N/A 01/31/2019   Procedure: ESOPHAGOGASTRODUODENOSCOPY (EGD) WITH PROPOFOL ;  Surgeon: Unk Corinn Skiff, MD;  Location: [facility] ENDOSCOPY;  Service: Gastroenterology;  Laterality: N/A;  . ESOPHAGOGASTRODUODENOSCOPY (EGD) WITH PROPOFOL  N/A 05/20/2022   Procedure: ESOPHAGOGASTRODUODENOSCOPY (EGD) WITH PROPOFOL ;  Surgeon: Unk Corinn Skiff, MD;  Location: [facility] ENDOSCOPY;  Service: Gastroenterology;  Laterality: N/A;  . ESOPHAGOGASTRODUODENOSCOPY (EGD) WITH PROPOFOL  N/A 09/24/2022   Procedure: ESOPHAGOGASTRODUODENOSCOPY (EGD) WITH PROPOFOL ;  Surgeon: Unk Corinn Skiff, MD;  Location: [facility] ENDOSCOPY;  Service: Gastroenterology;  Laterality: N/A;  . FLEXIBLE SIGMOIDOSCOPY N/A 05/21/2020   Procedure: FLEXIBLE SIGMOIDOSCOPY;  Surgeon: Unk Corinn Skiff, MD;  Location: [facility] ENDOSCOPY;  Service: Gastroenterology;  Laterality: N/A;  . FOOT SURGERY     left foot 11/2016 Dr. Rainell Sar   . left elbow surgery     07/2017 Dr Helayne Glenn epicondylitis   . LYSIS OF ADHESION Right 08/27/2021   Procedure: Right shoulder arthroscopic lysis of adhesions,  capsular release, and manipulation under anesthesia with corticosteroid injection and biceps tenodesis;  Surgeon: Tobie Priest, MD;  Location: [facility];  Service: Orthopedics;  Laterality: Right;  . MOHS SURGERY     x 2 face nose and left cheek   . OSTEOCHONDROMA EXCISION Left 05/29/2016   Procedure: OSTEOCHONDRAL DRILLING TALUS;  Surgeon: Sar Thresa HERO, DPM;  Location: [facility] OR;  Service: Podiatry;  Laterality: Left;  . PLANTAR FASCIA RELEASE Left 05/29/2016   Procedure: ENDOSCOPIC PLANTAR FASCIOTOMY;  Surgeon: Sar Thresa HERO, DPM;  Location: [facility] OR;  Service: Podiatry;  Laterality: Left;  . ROTATOR CUFF REPAIR Left   . SHOULDER ARTHROSCOPY WITH BICEPSTENOTOMY Right 03/31/2022   Procedure: SHOULDER ARTHROSCOPY WITH BICEPS TENOTOMY, ARTHROSCOPIC LYSIS OF ADHESIONS, AND CAPSULAR RELEASE, AND EXTENSIVE GLENOHUMERAL DEBRIDEMENT;  Surgeon: Tobie Priest, MD;  Location: [facility] ORS;  Service: Orthopedics;  Laterality: Right;  . SHOULDER ARTHROSCOPY WITH ROTATOR CUFF REPAIR  03/31/2022   Procedure: SHOULDER ARTHROSCOPY WITH ROTATOR CUFF REPAIR;  Surgeon: Tobie Priest, MD;  Location: [facility] ORS;  Service: Orthopedics;;  . STOMACH SURGERY  2017   gastric sleeve surgery  . TENNIS ELBOW RELEASE/NIRSCHEL PROCEDURE Left 08/05/2017   Procedure: TENNIS ELBOW RELEASE/NIRSCHEL PROCEDURE;  Surgeon: Glenn Helayne, MD;  Location: [facility] ORS;  Service: Orthopedics;  Laterality: Left;  . TUBAL LIGATION    . wedge resection of ovary  1982    Family History  Problem Relation Age of Onset  . Heart disease Mother   . Breast cancer Mother 45       metastatic, now hip, spine, and leg   . Arthritis Mother   . Hyperlipidemia Mother   . Cancer Mother        spinal surgery. breast dx'ed age 70 now metastatic 39 as of 07/13/18  . Cystic fibrosis Mother        carrier   . Heart disease Father   . Lung cancer Father        smoker  . Hyperlipidemia Father   . Hypertension Father   . Depression Sister   . Diabetes  Sister   . Stroke Sister   . Other Sister        covid +  . Diabetes Sister   . Diabetes Maternal Aunt   . Alcohol  abuse Maternal Aunt   . Diabetes Maternal Aunt   . Alcohol abuse Maternal Uncle   . Diabetes Maternal Uncle   . Alcohol abuse Paternal Uncle   . Diabetes Maternal Grandfather   . Hyperlipidemia Maternal Grandfather   . Arthritis Maternal Grandmother   . Sudden death Cousin   . Schizophrenia Cousin   . Cystic fibrosis Other        great niece   . Factor V Leiden deficiency Other        uncle and cousins and m GF (all maternal)  . Alcohol abuse Niece   . Alcohol abuse Nephew   . ADD / ADHD Son   . Depression Son   . ADD / ADHD Son   . Post-traumatic stress disorder Son   . Colon cancer Neg Hx   . Ovarian cancer Neg Hx     Allergies  Allergen Reactions  . Tetracyclines & Related Swelling and Rash    FACE SWELL, RASH ON CHEST  . Amoxicillin    . Alcohol Rash    ABDOMINAL CRAMPS, drinking alcohol  . Azithromycin  Rash  . Metoprolol  Diarrhea    Current Outpatient Medications on File Prior to Visit  Medication Sig Dispense Refill  . buPROPion  (WELLBUTRIN ) 75 MG tablet Take 1 tablet (75 mg total) by mouth in the morning. 90 tablet 1  . citalopram  (CELEXA ) 40 MG tablet Take 1 tablet (40 mg total) by mouth daily. 90 tablet 1  . clonazePAM  (KLONOPIN ) 0.5 MG tablet Take 0.5-1 tablets (0.25-0.5 mg total) by mouth daily as needed for anxiety. Limited supply 10 tablet 0  . fexofenadine  (ALLEGRA ) 180 MG tablet Take 1 tablet (180 mg total) by mouth daily as needed for allergies or rhinitis. 90 tablet 3  . gabapentin  (NEURONTIN ) 300 MG capsule TAKE 1 CAPSULE BY MOUTH 3 TIMES DAILY AS NEEDED. 270 capsule 1  . hydrOXYzine  (ATARAX ) 25 MG tablet TAKE 0.5-1 TABLETS (12.5-25 MG TOTAL) BY MOUTH 3 (THREE) TIMES DAILY AS NEEDED FOR ANXIETY (AND SLEEP). 270 tablet 1  . montelukast  (SINGULAIR ) 10 MG tablet TAKE 1 TABLET BY MOUTH EVERYDAY AT BEDTIME 90 tablet 3  . omeprazole  (PRILOSEC)  40 MG capsule Take 1 capsule (40 mg total) by mouth daily. 90 capsule 3  . ondansetron  (ZOFRAN -ODT) 4 MG disintegrating tablet Take 1 tablet (4 mg total) by mouth every 8 (eight) hours as needed. for nausea 20 tablet 2  . Rimegepant Sulfate (NURTEC) 75 MG TBDP Take 1 tablet (75 mg total) by mouth daily as needed (take for abortive therapy of migraine, no more than 1 tablet in 24 hours or 10 per month). 10 tablet 2  . Roflumilast  (ZORYVE ) 0.3 % CREA Apply topically to dry patches at face once daily prn 60 g 4  . rosuvastatin  (CRESTOR ) 20 MG tablet Take 1 tablet (20 mg total) by mouth daily. 90 tablet 2  . topiramate  (TOPAMAX ) 50 MG tablet Take 1 tablet (50 mg total) by mouth daily. 90 tablet 3  . zolpidem  (AMBIEN  CR) 6.25 MG CR tablet Take 2 tablets (12.5 mg total) by mouth at bedtime as needed for sleep. 60 tablet 3   No current facility-administered medications on file prior to visit.    BP 112/64   Pulse (!) 59   Temp 98.2 F (36.8 C)   Resp 20   Ht 5' 2 (1.575 m)   Wt 128 lb 2 oz (58.1 kg)   SpO2 99%   BMI 23.43 kg/m chart  Objective:    BP 112/64   Pulse (!) 59   Temp 98.2 F (36.8 C)   Resp 20   Ht 5' 2 (1.575 m)   Wt 128 lb 2 oz (58.1 kg)   SpO2 99%   BMI 23.43 kg/m    Physical Exam Vitals and nursing note reviewed.  Constitutional:      Appearance: Normal appearance. She is normal weight.  HENT:     Right Ear: Tympanic membrane, ear canal and external ear normal.     Left Ear: Tympanic membrane, ear canal and external ear normal.     Nose: Congestion present.     Mouth/Throat:     Pharynx: No oropharyngeal exudate or posterior oropharyngeal erythema.  Cardiovascular:     Rate and Rhythm: Normal rate and regular rhythm.     Pulses: Normal pulses.     Heart sounds: Normal heart sounds.  Pulmonary:     Effort: Pulmonary effort is normal.     Breath sounds: Normal breath sounds.  Abdominal:     General: Abdomen is flat. Bowel sounds are normal.      Palpations: Abdomen is soft.  Musculoskeletal:        General: Normal range of motion.  Skin:    General: Skin is warm and dry.  Neurological:     General: No focal deficit present.     Mental Status: She is alert and oriented to person, place, and time. Mental status is at baseline.  Psychiatric:        Mood and Affect: Mood normal.        Behavior: Behavior normal.    No results found for any visits on 09/15/23.      Assessment & Plan:   Problem List Items Addressed This Visit     Type 2 diabetes mellitus with complications (HCC)   Relevant Medications   tirzepatide  (MOUNJARO ) 2.5 MG/0.5ML Pen   Allergic rhinitis   Other Visit Diagnoses       Stress incontinence in female    -  Primary   Relevant Medications   solifenacin  (VESICARE ) 5 MG tablet   Other Relevant Orders   POC Urinalysis Dipstick   Urine Culture     Vaginal candida       Relevant Medications   fluconazole  (DIFLUCAN ) 150 MG tablet       Meds ordered this encounter  Medications  . tirzepatide  (MOUNJARO ) 2.5 MG/0.5ML Pen    Sig: Every other week    Dispense:  6 mL    Refill:  1  . levocetirizine (XYZAL ) 5 MG tablet    Sig: Take 1 tablet (5 mg total) by mouth every evening.    Dispense:  90 tablet    Refill:  1  . solifenacin  (VESICARE ) 5 MG tablet    Sig: Take 1 tablet (5 mg total) by mouth daily.    Dispense:  90 tablet    Refill:  1  . fluconazole  (DIFLUCAN ) 150 MG tablet    Sig: Take 1 tablet (150 mg total) by mouth once a week.    Dispense:  2 tablet    Refill:  0   D/C Allegra  and start Xyzal  5 mg at night. Continue Singulair . Start Vesicare  for urinary incontinence symptoms. Also sent Diflucan  to help with vaginal chaffing.  Call the office if symptoms worsen or persist. Recheck as scheduled and sooner as needed. No follow-ups on file.  Analena Gama B Kamiah Fite, FNP

## 2023-09-15 NOTE — Patient Instructions (Signed)
 Great to see you today!  Stop allegra  and start Xyzal  once daily in the evening.  Diflucan  once weekly x 2 weeks.  Start Vesicare  for urinary incontinence

## 2023-09-16 ENCOUNTER — Ambulatory Visit: Payer: Self-pay | Admitting: Family

## 2023-09-16 LAB — URINE CULTURE
MICRO NUMBER:: 16884570
SPECIMEN QUALITY:: ADEQUATE

## 2023-09-29 ENCOUNTER — Encounter: Payer: Self-pay | Admitting: Psychiatry

## 2023-09-29 ENCOUNTER — Telehealth (INDEPENDENT_AMBULATORY_CARE_PROVIDER_SITE_OTHER): Admitting: Psychiatry

## 2023-09-29 DIAGNOSIS — F411 Generalized anxiety disorder: Secondary | ICD-10-CM | POA: Diagnosis not present

## 2023-09-29 DIAGNOSIS — G4701 Insomnia due to medical condition: Secondary | ICD-10-CM | POA: Diagnosis not present

## 2023-09-29 DIAGNOSIS — F3342 Major depressive disorder, recurrent, in full remission: Secondary | ICD-10-CM

## 2023-09-29 DIAGNOSIS — Z634 Disappearance and death of family member: Secondary | ICD-10-CM

## 2023-09-29 MED ORDER — CITALOPRAM HYDROBROMIDE 40 MG PO TABS
40.0000 mg | ORAL_TABLET | Freq: Every day | ORAL | 1 refills | Status: AC
Start: 2023-09-29 — End: ?

## 2023-09-29 MED ORDER — BUPROPION HCL 75 MG PO TABS: 75.0000 mg | ORAL_TABLET | Freq: Every morning | ORAL | 1 refills | Status: AC

## 2023-09-29 NOTE — Progress Notes (Unsigned)
 Virtual Visit via Video Note  I connected with Kristin [name] on 09/29/23 at 11:40 AM EDT by a video enabled telemedicine application and verified that I am speaking with the correct person using two identifiers.  Location Provider Location : [facility] Patient Location : Home  Participants: Patient , Provider    I discussed the limitations of evaluation and management by telemedicine and the availability of in person appointments. The patient expressed understanding and agreed to proceed.   I discussed the assessment and treatment plan with the patient. The patient was provided an opportunity to ask questions and all were answered. The patient agreed with the plan and demonstrated an understanding of the instructions.   The patient was advised to call back or seek an in-person evaluation if the symptoms worsen or if the condition fails to improve as anticipated.  [facility] MD OP Progress Note  09/29/2023 11:54 AM Kristin [name]  MRN:  [id number]  Chief Complaint:  Chief Complaint  Patient presents with   Follow-up   Depression   Anxiety   Medication Refill   Discussed the use of AI scribe software for clinical note transcription with the patient, who gave verbal consent to proceed.  History of Present Illness Kristin [name] is a 64 year old Caucasian female, married, retired, lives in [place], has a history of anxiety, depression, insomnia, diabetes mellitus, polyneuropathy, tendinopathy of right biceps tendon status post right shoulder arthropathy with repair of subscapularis and supraspinatus, subacromial decompression with extensive debridement of shoulder and distal clavicle resection, chronic migraine was evaluated by telemedicine today.  She reports doing well overall and describes that she managed better than she expected with the recent anniversary of her mother's passing. Support from her husband, sons, and sisters helped her during this time. Staying busy with home  projects allowed her to manage better than she expected.  She reports her mood remains stable, and she maintains her daily activities, including organizing her home and engaging in walking for exercise. She notes that her weight has remained steady and her appetite is good.  She reports her sleep has improved. She currently takes Ambien  at bedtime and has adopted a new approach, taking 1 Ambien  to initiate sleep and only taking a second dose if she cannot fall asleep. On several occasions, she has fallen asleep without needing the second dose, which she considers an improvement.  She continues to take Celexa  and Wellbutrin  as prescribed and does not report any concerns with these medications. She also has clonazepam  and hydroxyzine  available as needed but rarely uses them, indicating she still has a supply of both.  She denies thoughts about hurting herself or others and denies experiencing auditory hallucinations.  She previously engaged in therapy with Ms. [name] Hummer  and recently discontinued as she felt she no longer needed ongoing support. She reports learning tools to help manage her anxiety and depression and feels comfortable reaching out to her therapist if needed in the future.  She denies any other concerns today.  Visit Diagnosis:    ICD-10-CM   1. GAD (generalized anxiety disorder)  F41.1 citalopram  (CELEXA ) 40 MG tablet    2. Recurrent major depressive disorder, in full remission (HCC)  F33.42 buPROPion  (WELLBUTRIN ) 75 MG tablet    3. Insomnia due to medical condition  G47.01    mood    4. Bereavement  Z63.4       Past Psychiatric History: I have reviewed past psychiatric history from progress note on 04/03/2022.  Past trials  of medications like Prozac , trazodone , nortriptyline , amitriptyline  for headaches, neuropathy.  Patient had ADHD testing completed at [facility]-no formal diagnosis of ADHD at that time.  Past Medical History:  Past Medical  History:  Diagnosis Date   ADHD    Adhesive capsulitis of right shoulder 07/23/2020   Allergy     Annual physical exam 08/19/2019   Anxiety 12/12/2015   Arthritis    Basal cell carcinoma    nose, left cheek, right upper lip, right posterior shoulder most recent 6 years ago on left cheek   Basal cell carcinoma 03/26/2021   L chest, EDC 04/24/2021   Basal cell carcinoma 07/06/2023   L pretibial, EDC   Benign essential hypertension 08/14/2014   Bleeding    Bursitis of right shoulder 12/25/2020   Chicken pox    Chronic diarrhea    Chronic intractable headache 10/20/2017   Chronic migraine w/o aura, not intractable, w/o stat migr 08/08/2020   Complication of anesthesia    blood pressure elevated after gastric sleeve surgery in recovery, pt does not want mask over face; migraines   COVID-19    03/01/20 had MAB 03/03/20 and felt better next day 03/04/20   CTS (carpal tunnel syndrome) 04/05/2020   Dr. Maree s/p [facility]/Lidocaine  injection 12/05/19   Depression    Diabetes mellitus without complication (HCC)    pre-diabetic currently, was diabetic prior to gastric sleeve surgery    Diabetic polyneuropathy associated with diabetes mellitus due to underlying condition (HCC) 08/08/2020   Disorder of right rotator cuff    10/03/19 MRI right shoulder abnormal Dr. Lorriane Blanch   Fall    08/2020   Fall    03/28/21   Fatty liver    Fatty liver 05/06/2017   US  + 2013    Gout    Hematuria 10/15/2018   History of shingles 06/19/2021   lower back and R side of thigh   Hives 03/28/2020   Hyperlipidemia    Hypertension    no problems since losing weight after gastric sleeve surgery   Hypertension 07/17/2014   IBS (irritable bowel syndrome)    Injury of right wrist 06/26/2016   Insomnia    Migraine    Neuropathy    bilateral feet   Obesity    Obesity (BMI 30-39.9) 05/06/2017   Obesity, diabetes, and hypertension syndrome (HCC) 08/18/2019   Osteopenia    Palpitations 08/14/2014   PONV  (postoperative nausea and vomiting)    Squamous cell carcinoma of skin 03/26/2021   SCC IS R mid sternum, EDC 04/24/2021   Tendonitis of shoulder, right 12/25/2020    Past Surgical History:  Procedure Laterality Date   ABDOMINAL HYSTERECTOMY     no h/o abnormal pap still has 1 ovary left   ANKLE ARTHROSCOPY Left 05/29/2016   Procedure: ANKLE ARTHROSCOPY;  Surgeon: Janit Thresa HERO, DPM;  [facility];  Service: Podiatry;  Laterality: Left;   APPENDECTOMY     basal cell removed     CESAREAN SECTION N/A    1985 & 1995   CHOLECYSTECTOMY     CLOSED MANIPULATION SHOULDER WITH STERIOD INJECTION Right 03/31/2022   Procedure: MANIPULATION UNDER ANESTHESIA WITH GLENOHUMERAL AND SUBACROMIAL CORTICOSTERIOD INJECTION;  Surgeon: Blanch Priest, MD;  Location: [facility] ORS;  Service: Orthopedics;  Laterality: Right;   COLONOSCOPY WITH PROPOFOL  N/A 01/31/2019   Procedure: COLONOSCOPY WITH PROPOFOL ;  Surgeon: Unk Corinn Skiff, MD;  Location[facility];  Service: Gastroenterology;  Laterality: N/A;   cyst removed from wrist  ESOPHAGOGASTRODUODENOSCOPY (EGD) WITH PROPOFOL  N/A 01/31/2019   Procedure: ESOPHAGOGASTRODUODENOSCOPY (EGD) WITH PROPOFOL ;  Surgeon: Unk Corinn Skiff, MD;  Location: [facility] ENDOSCOPY;  Service: Gastroenterology;  Laterality: N/A;   ESOPHAGOGASTRODUODENOSCOPY (EGD) WITH PROPOFOL  N/A 05/20/2022   Procedure: ESOPHAGOGASTRODUODENOSCOPY (EGD) WITH PROPOFOL ;  Surgeon: Unk Corinn Skiff, MD;  Location: [facility] ENDOSCOPY;  Service: Gastroenterology;  Laterality: N/A;   ESOPHAGOGASTRODUODENOSCOPY (EGD) WITH PROPOFOL  N/A 09/24/2022   Procedure: ESOPHAGOGASTRODUODENOSCOPY (EGD) WITH PROPOFOL ;  Surgeon: Unk Corinn Skiff, MD;  Location: [facility] ENDOSCOPY;  Service: Gastroenterology;  Laterality: N/A;   FLEXIBLE SIGMOIDOSCOPY N/A 05/21/2020   Procedure: FLEXIBLE SIGMOIDOSCOPY;  Surgeon: Unk Corinn Skiff, MD;  Location: [facility] ENDOSCOPY;  Service: Gastroenterology;  Laterality: N/A;   FOOT SURGERY      left foot 11/2016 Dr. Rainell Sar    left elbow surgery     07/2017 Dr Helayne Glenn epicondylitis    LYSIS OF ADHESION Right 08/27/2021   Procedure: Right shoulder arthroscopic lysis of adhesions, capsular release, and manipulation under anesthesia with corticosteroid injection and biceps tenodesis;  Surgeon: Tobie Priest, MD;  Location: [facility];  Service: Orthopedics;  Laterality: Right;   MOHS SURGERY     x 2 face nose and left cheek    OSTEOCHONDROMA EXCISION Left 05/29/2016   Procedure: OSTEOCHONDRAL DRILLING TALUS;  Surgeon: Sar Thresa HERO, DPM;  Location[facility];  Service: Podiatry;  Laterality: Left;   PLANTAR FASCIA RELEASE Left 05/29/2016   Procedure: ENDOSCOPIC PLANTAR FASCIOTOMY;  Surgeon: Sar Thresa HERO, DPM;  [facility];  Service: Podiatry;  Laterality: Left;   ROTATOR CUFF REPAIR Left    SHOULDER ARTHROSCOPY WITH BICEPSTENOTOMY Right 03/31/2022   Procedure: SHOULDER ARTHROSCOPY WITH BICEPS TENOTOMY, ARTHROSCOPIC LYSIS OF ADHESIONS, AND CAPSULAR RELEASE, AND EXTENSIVE GLENOHUMERAL DEBRIDEMENT;  Surgeon: Tobie Priest, MD;  Location: [facility] OR[facility];  Service: Orthopedics;  Laterality: Right;   SHOULDER ARTHROSCOPY WITH ROTATOR CUFF REPAIR  03/31/2022   Procedure: SHOULDER ARTHROSCOPY WITH ROTATOR CUFF REPAIR;  Surgeon: Tobie Priest, MD;  Location: [facility] ORS;  Service: Orthopedics;;   STOMACH SURGERY  2017   gastric sleeve surgery   TENNIS ELBOW RELEASE/NIRSCHEL PROCEDURE Left 08/05/2017   Procedure: TENNIS ELBOW RELEASE/NIRSCHEL PROCEDURE;  Surgeon: Glenn Helayne, MD;  Location: [facility] ORS;  Service: Orthopedics;  Laterality: Left;   TUBAL LIGATION     wedge resection of ovary  1982    Family Psychiatric History: Reviewed family psychiatric history from progress note on 04/03/2022.  Family History:  Family History  Problem Relation Age of Onset   Heart disease Mother    Breast cancer Mother 56       metastatic, now hip, spine, and leg    Arthritis Mother     Hyperlipidemia Mother    Cancer Mother        spinal surgery. breast dx'ed age 19 now metastatic 57 as of 07/13/18   Cystic fibrosis Mother        carrier    Heart disease Father    Lung cancer Father        smoker   Hyperlipidemia Father    Hypertension Father    Depression Sister    Diabetes Sister    Stroke Sister    Other Sister        covid +   Diabetes Sister    Diabetes Maternal Aunt    Alcohol abuse Maternal Aunt    Diabetes Maternal Aunt    Alcohol abuse Maternal Uncle    Diabetes Maternal Uncle    Alcohol abuse Paternal  Uncle    Diabetes Maternal Grandfather    Hyperlipidemia Maternal Grandfather    Arthritis Maternal Grandmother    Sudden death Cousin    Schizophrenia Cousin    Cystic fibrosis Other        great niece    Factor V Leiden deficiency Other        uncle and cousins and m GF (all maternal)   Alcohol abuse Niece    Alcohol abuse Nephew    ADD / ADHD Son    Depression Son    ADD / ADHD Son    Post-traumatic stress disorder Son    Colon cancer Neg Hx    Ovarian cancer Neg Hx     Social History: Reviewed social history from progress note on 04/03/2022. Social History   Socioeconomic History   Marital status: Married    Spouse name: Not on file   Number of children: 3   Years of education: Not on file   Highest education level: 12th grade  Occupational History   Not on file  Tobacco Use   Smoking status: Never   Smokeless tobacco: Never  Vaping Use   Vaping status: Never Used  Substance and Sexual Activity   Alcohol use: Never   Drug use: Never   Sexual activity: Yes    Partners: Male    Birth control/protection: Surgical  Other Topics Concern   Not on file  Social History Narrative   Married x 37 years as of 10/2017    Kids 3 boys youngest is Deward   Write childrens books    Retired from [employer] 01/2017    Enjoys traveling    Right handed   Caffeine: quit 2009   Social Drivers of [employer] Strain: Low Risk   (09/14/2023)   Overall Financial Resource Strain (CARDIA)    Difficulty of Paying Living Expenses: Not very hard  Food Insecurity: No Food Insecurity (09/14/2023)   Hunger Vital Sign    Worried About Running Out of Food in the Last Year: Never true    Ran Out of Food in the Last Year: Never true  Transportation Needs: Unmet Transportation Needs (09/14/2023)   PRAPARE - Administrator, Civil Service (Medical): Yes    Lack of Transportation (Non-Medical): No  Physical Activity: Insufficiently Active (09/14/2023)   Exercise Vital Sign    Days of Exercise per Week: 1 day    Minutes of Exercise per Session: 30 min  Stress: Stress Concern Present (09/14/2023)   [employer] of Occupational Health - Occupational Stress Questionnaire    Feeling of Stress: Rather much  Social Connections: Moderately Isolated (09/14/2023)   Social Connection and Isolation Panel    Frequency of Communication with Friends and Family: More than three times a week    Frequency of Social Gatherings with Friends and Family: Once a week    Attends Religious Services: Patient declined    Database administrator or Organizations: No    Attends Engineer, structural: Not on file    Marital Status: Married    Allergies:  Allergies  Allergen Reactions   Tetracyclines & Related Swelling and Rash    FACE SWELL, RASH ON CHEST   Amoxicillin     Alcohol Rash    ABDOMINAL CRAMPS, drinking alcohol   Azithromycin  Rash   Metoprolol  Diarrhea    Metabolic Disorder Labs: Lab Results  Component Value Date   HGBA1C 5.5 03/16/2023   No results found for: PROLACTIN  Lab Results  Component Value Date   CHOL 151 07/03/2022   TRIG 70.0 07/03/2022   HDL 59.20 07/03/2022   CHOLHDL 3 07/03/2022   VLDL 14.0 07/03/2022   LDLCALC 77 07/03/2022   LDLCALC 53 01/09/2022   Lab Results  Component Value Date   TSH 3.58 03/16/2023   TSH 1.62 10/30/2022    Therapeutic Level Labs: No results found for:  LITHIUM No results found for: VALPROATE No results found for: CBMZ  Current Medications: Current Outpatient Medications  Medication Sig Dispense Refill   celecoxib (CELEBREX) 200 MG capsule Take 200 mg by mouth.     cetirizine (ZYRTEC) 10 MG chewable tablet Chew 10 mg by mouth daily.     buPROPion  (WELLBUTRIN ) 75 MG tablet Take 1 tablet (75 mg total) by mouth in the morning. 90 tablet 1   citalopram  (CELEXA ) 40 MG tablet Take 1 tablet (40 mg total) by mouth daily. 90 tablet 1   clonazePAM  (KLONOPIN ) 0.5 MG tablet Take 0.5-1 tablets (0.25-0.5 mg total) by mouth daily as needed for anxiety. Limited supply 10 tablet 0   fexofenadine  (ALLEGRA ) 180 MG tablet Take 1 tablet (180 mg total) by mouth daily as needed for allergies or rhinitis. 90 tablet 3   fluconazole  (DIFLUCAN ) 150 MG tablet Take 1 tablet (150 mg total) by mouth once a week. 2 tablet 0   gabapentin  (NEURONTIN ) 300 MG capsule TAKE 1 CAPSULE BY MOUTH 3 TIMES DAILY AS NEEDED. 270 capsule 1   hydrOXYzine  (ATARAX ) 25 MG tablet TAKE 0.5-1 TABLETS (12.5-25 MG TOTAL) BY MOUTH 3 (THREE) TIMES DAILY AS NEEDED FOR ANXIETY (AND SLEEP). 270 tablet 1   montelukast  (SINGULAIR ) 10 MG tablet TAKE 1 TABLET BY MOUTH EVERYDAY AT BEDTIME 90 tablet 3   omeprazole  (PRILOSEC) 40 MG capsule Take 1 capsule (40 mg total) by mouth daily. 90 capsule 3   ondansetron  (ZOFRAN -ODT) 4 MG disintegrating tablet Take 1 tablet (4 mg total) by mouth every 8 (eight) hours as needed. for nausea 20 tablet 2   Rimegepant Sulfate (NURTEC) 75 MG TBDP Take 1 tablet (75 mg total) by mouth daily as needed (take for abortive therapy of migraine, no more than 1 tablet in 24 hours or 10 per month). 10 tablet 2   Roflumilast  (ZORYVE ) 0.3 % CREA Apply topically to dry patches at face once daily prn 60 g 4   rosuvastatin  (CRESTOR ) 20 MG tablet Take 1 tablet (20 mg total) by mouth daily. 90 tablet 2   solifenacin  (VESICARE ) 5 MG tablet Take 1 tablet (5 mg total) by mouth daily. 90  tablet 1   tirzepatide  (MOUNJARO ) 2.5 MG/0.5ML Pen Every other week 6 mL 1   topiramate  (TOPAMAX ) 50 MG tablet Take 1 tablet (50 mg total) by mouth daily. 90 tablet 3   zolpidem  (AMBIEN  CR) 6.25 MG CR tablet Take 2 tablets (12.5 mg total) by mouth at bedtime as needed for sleep. 60 tablet 3   No current facility-administered medications for this visit.     Musculoskeletal: Strength & Muscle Tone: UTA Gait & Station: Seated Patient leans: N/A  Psychiatric Specialty Exam: Review of Systems  Psychiatric/Behavioral:  The patient is nervous/anxious.     There were no vitals taken for this visit.There is no height or weight on file to calculate BMI.  General Appearance: Fairly Groomed  Eye Contact:  Fair  Speech:  Clear and Coherent  Volume:  Normal  Mood:  Anxious improving  Affect:  Congruent  Thought Process:  Goal Directed and  Descriptions of Associations: Intact  Orientation:  Full (Time, Place, and Person)  Thought Content: Logical   Suicidal Thoughts:  No  Homicidal Thoughts:  No  Memory:  Immediate;   Fair Recent;   Fair Remote;   Fair  Judgement:  Fair  Insight:  Fair  Psychomotor Activity:  Normal  Concentration:  Concentration: Fair and Attention Span: Fair  Recall:  [employer] of Knowledge: Fair  Language: Fair  Akathisia:  No  Handed:  Right  AIMS (if indicated): not done  Assets:  Manufacturing systems engineer Desire for Improvement Housing Social Support Transportation  ADL's:  Intact  Cognition: WNL  Sleep:  Fair   Screenings: GAD-7    Garment/textile technologist Visit from 09/15/2023 in [facility] [employer] Visit from 06/30/2023 in [facility] [employer] Visit from 03/16/2023 in [facility] [employer] Visit from 01/28/2023 in [facility] Office Visit from 01/01/2023 in [facility] [employer]   Total GAD-7 Score 7 14 16 16 14    PHQ2-9    Flowsheet Row Office Visit from 09/15/2023 in [facility] Visit from 06/30/2023 in [facility] [employer] Visit from 03/16/2023 in [facility] [employer] Visit from 01/28/2023 in [facility] Office Visit from 01/01/2023 in [facility] [place]  PHQ-2 Total Score 2 2 3 4 4   PHQ-9 Total Score 5 12 15 16 13    Flowsheet Row Video Visit from 09/29/2023 in [facility] Video Visit from 07/29/2023 in [facility] UC from 07/23/2023 in [facility] [place]   C-SSRS RISK CATEGORY No Risk No Risk No Risk     Assessment and Plan: Kristin [name] is a 63 year old Caucasian female, married, retired, lives in [place], has a history of anxiety, depression, multiple medical problems including chronic pain, was evaluated by telemedicine today.  Discussed assessment and plan as noted below.  Major depression in remission Currently well-managed on current medication regimen. Continue Wellbutrin  75 mg daily Continue Celexa  40 mg daily  Generalized anxiety disorder-improving Currently reports anxiety symptoms are better managed. Continue Clonazepam  0.25-0.5 mg as needed for situational anxiety. Continue Hydroxyzine  25 mg twice a day as needed-she rarely uses it. Reviewed [place] PMP AWARxE  Insomnia-stable Currently reports sleep is overall good. Continue Ambien  12.5 mg at bedtime as needed  Bereavement-stable Currently denies any significant grief at has good social support system which has been beneficial Continue psychotherapy sessions as needed with Ms. [name] Hummer  Patient was referred for ADHD testing to Dr. Corina last visit dated  07/29/2023-pending.  Follow-up Follow-up in clinic in 5 months or sooner if needed.    Consent: Patient/Guardian gives verbal consent for treatment and assignment of benefits for services provided during this visit. Patient/Guardian expressed understanding and agreed to proceed.   This note was generated in part or whole with voice recognition software. Voice recognition is usually quite accurate but there are transcription errors that can and very often do occur. I apologize for any typographical errors that were not detected and corrected.    Caliyah Sieh, MD 09/30/2023, 5:08 PM

## 2023-10-01 ENCOUNTER — Ambulatory Visit

## 2023-10-01 DIAGNOSIS — D1801 Hemangioma of skin and subcutaneous tissue: Secondary | ICD-10-CM

## 2023-10-01 DIAGNOSIS — L814 Other melanin hyperpigmentation: Secondary | ICD-10-CM | POA: Diagnosis not present

## 2023-10-01 DIAGNOSIS — L820 Inflamed seborrheic keratosis: Secondary | ICD-10-CM

## 2023-10-01 DIAGNOSIS — L578 Other skin changes due to chronic exposure to nonionizing radiation: Secondary | ICD-10-CM

## 2023-10-01 DIAGNOSIS — W908XXA Exposure to other nonionizing radiation, initial encounter: Secondary | ICD-10-CM

## 2023-10-01 DIAGNOSIS — Z1283 Encounter for screening for malignant neoplasm of skin: Secondary | ICD-10-CM | POA: Diagnosis not present

## 2023-10-01 DIAGNOSIS — L821 Other seborrheic keratosis: Secondary | ICD-10-CM

## 2023-10-01 DIAGNOSIS — D229 Melanocytic nevi, unspecified: Secondary | ICD-10-CM

## 2023-10-01 DIAGNOSIS — Z85828 Personal history of other malignant neoplasm of skin: Secondary | ICD-10-CM

## 2023-10-01 DIAGNOSIS — C4490 Unspecified malignant neoplasm of skin, unspecified: Secondary | ICD-10-CM

## 2023-10-01 NOTE — Progress Notes (Signed)
    Subjective   Kristin [name] is a 63 y.o. female who presents for the following: Lesion(s) of concern . Patient is established patient   Today patient reports: Place of concern underneath right breast, has always been there but quickly changed in appearance, place is now flat and crusted. Also, has place at left hand. HxSCC and BCC.   Review of Systems:    No other skin or systemic complaints except as noted in HPI or Assessment and Plan.  The following portions of the chart were reviewed this encounter and updated as appropriate: medications, allergies, medical history  Relevant Medical History:  Personal history of non melanoma skin cancer - see medical history for full details   Objective  Well appearing patient in no apparent distress; mood and affect are within normal limits. Examination was performed of the: Sun Exposed Exam: Scalp, head, eyes, ears, nose, lips, neck, upper extremities, hands, fingers, fingernails  Examination notable for: Lentigo/lentigines: Scattered pigmented macules that are tan to brown in color and are somewhat non-uniform in shape and concentrated in the sun-exposed areas, Nevus/nevi: Scattered well-demarcated, regular, pigmented macule(s) and/or papule(s)  , Seborrheic Keratosis(es): Stuck-on appearing keratotic papule(s) on the trunk, some  irritated with redness, crusting, edema, and/or partial avulsion, Actinic Damage/Elastosis: chronic sun damage: dyspigmentation, telangiectasia, and wrinkling  Examination limited by: Undergarments, Clothing, and Patient deferred removal       Assessment & Plan   SKIN CANCER SCREENING PERFORMED TODAY.  BENIGN SKIN FINDINGS  - Lentigines  - Seborrheic keratoses  - Hemangiomas   - Nevus/Multiple Benign Nevi  - Reassurance provided regarding the benign appearance of lesions noted on exam today; no treatment is indicated in the absence of symptoms/changes. - Reinforced importance of photoprotective strategies  including liberal and frequent sunscreen use of a broad-spectrum SPF 30 or greater, use of protective clothing, and sun avoidance for prevention of cutaneous malignancy and photoaging.  Counseled patient on the importance of regular self-skin monitoring as well as routine clinical skin examinations as scheduled.   ACTINIC DAMAGE - Chronic condition, secondary to cumulative UV/sun exposure - Recommend daily broad spectrum sunscreen SPF 30+ to sun-exposed areas, reapply every 2 hours as needed.  - Staying in the shade or wearing long sleeves, sun glasses (UVA+UVB protection) and wide brim hats (4-inch brim around the entire circumference of the hat) are also recommended for sun protection.  - Call for new or changing lesions.  Personal history of non melanoma skin cancer  - Reviewed medical history for full details  - Reviewed sun protective measures as above - Encouraged full body skin exams   Seborrheic keratosis, inflamed - Discussed diagnosis, typical course, and treatment options for this condition - Reassurance, benign, monitor - ABCDE's discussed - Patient deferred cryotherapy.   Level of service outlined above   Procedures, orders, diagnosis for this visit:    There are no diagnoses linked to this encounter.  Return to clinic: Return for As scheduled, w/ Dr. Jackquline.  Documentation: I have reviewed the above documentation for accuracy and completeness, and I agree with the above.  Lauraine JAYSON Kanaris, MD

## 2023-10-01 NOTE — Patient Instructions (Signed)

## 2023-10-02 DIAGNOSIS — M5442 Lumbago with sciatica, left side: Secondary | ICD-10-CM | POA: Diagnosis not present

## 2023-10-02 DIAGNOSIS — M5416 Radiculopathy, lumbar region: Secondary | ICD-10-CM | POA: Diagnosis not present

## 2023-10-02 DIAGNOSIS — G8929 Other chronic pain: Secondary | ICD-10-CM | POA: Diagnosis not present

## 2023-10-02 DIAGNOSIS — M25552 Pain in left hip: Secondary | ICD-10-CM | POA: Diagnosis not present

## 2023-10-15 ENCOUNTER — Encounter: Payer: Self-pay | Admitting: Psychology

## 2023-10-15 ENCOUNTER — Encounter: Attending: Psychology | Admitting: Psychology

## 2023-10-15 DIAGNOSIS — R41840 Attention and concentration deficit: Secondary | ICD-10-CM | POA: Insufficient documentation

## 2023-10-15 NOTE — Progress Notes (Signed)
 NEUROPSYCHOLOGICAL EVALUATION [place]. [facility]  Physical Medicine and Rehabilitation     Patient: Kristin [name]  MRN: [id number] DOB: 06-07-1960  Age: 63 y.o. Sex: female  Race/Ethnicity: White or Caucasian  Years of Education: 13 Handedness: Right-handed  Referring Provider: Eappen, Saramma, MD  Provider/Clinical Neuropsychologist: Evalene DOROTHA Riff, PsyD  Date of Service: 10/15/2023 Start Time: 10 AM End Time: 12 PM  Location of Service:  [facility] Physical Medicine & Rehabilitation Department [place]. [facility] [address] [place] [place], [place] [address] Phone: [phone]  Billing Code/Service:            96116/96121  Individuals Present: Patient was seen unaccompanied, in-person, by the provider. 1 hour and 15 minutes spent in face-to-face clinical interview and remaining 45 minutes was spent in record review, documentation, and testing protocol construction.    PATIENT CONSENT AND CONFIDENTIALITY The patient's understanding of the reason for referral was intact. Discussed limits of confidentiality including, but not limited to, posting of final evaluation report in the patient's electronic medical record for both the patient and for the referring provider and appropriate medical professionals. Patient was given the opportunity to have their questions answered. The neuropsychological evaluation process was discussed with the patient and they consented to proceed with the evaluation.  Consent for Evaluation and Treatment: Signed: Yes Explanation of Privacy Policies: Signed: Yes Discussion of Confidentiality Limits: Yes  REASON FOR REFERRAL: The patient is a 63 year old woman referred for neuropsychological evaluation by her psychiatrist, Dr. Coby, on 07/29/2023.  Per records from referring provider, the patient previously had ADHD testing at [facility] and findings did not show definitive diagnosis of ADHD.   Records stated the patient was advised to have a reevaluation done once mood symptoms are better controlled.  At the time of the 07/29/2023 visit, the patient's anxiety and depression and sleep are reportedly well-managed and the patient was interested in referral for reevaluation.  Per records, the patient has diagnosis of generalized anxiety disorder, bereavement, and recurrent major depressive disorder (in remission).  Medical history includes insomnia, diabetes mellitus, polyneuropathy, tendinopathy of right biceps tendon status post right shoulder arthropathy with repair of subscapularis and supraspinatus, subacromial decompression with extensive debridement of shoulder and distal clavicle resection, chronic migraine per records.  Medical records from the patient's 2024 evaluation with [facility] was available for review.  ASRS self-report and collateral report (provided by son) endorsements generated total scores of 51 and 50, respectively, and specific item endorsements align with ADHD symptomology. Questionnaires from that evaluation noted significant difficulty with attention, memory, anxiety, and sleep.  Moderate difficulties were noted with panic and mood and milder difficulties with impulsivity, depression, fatigue, and pain.  The cognitive performance measure utilized in that evaluation stated objective testing with Qb.... Results showed statistically significant abnormalities in activity.  Attention parameters were only abnormal out of compensation; very slow and accurate.  A formal diagnosis of ADHD was not provided although symptom criteria suggested ADHD combined type.  The authors of the report indicated suspicion that symptomology was secondary to other factors.  They note difficulties with OCD and social anxiety.  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  They indicated that once anxiety was better manage would be  appropriate to undergo reevaluation.  Upon interview, the patient indicated that she is feeling much better relative to a year ago, noting significant improvements in her anxiety and mood.  She indicated that she continues  to experience difficulties with ADHD-like symptomology and is interested in undergoing the evaluation.  HISTORY OF PRESENTING CONCERNS:  Based on patient report within diagnostic clinical interview;  ADHD Symptomology: DIVA-5 (Diagnostic Interview for ADHD in Adults) is utilized in conjunction with broader in-depth diagnostic clinical interview to support differential diagnosis.  -Symptoms indicated as currently present (adult): Present at least 6 months but also appear chronic/trait-like. Symptoms negatively impact functioning. Symptoms are not better explained by another mental disorder. -Symptoms indicated as present during childhood: Present by 63 years of age, are inconsistent with developmental level, negatively impact functioning, and are not better explained by another mental disorder.  Attention-Deficit Symptoms: Often difficulties with.. Current Childhood  A1a Attention to detail/careless mistakes:  Yes Yes  A1b Sustained attention:  Yes Yes  A1c Attending when addressed directly:  Yes Yes  A1d Follow through on instructions / tasks:   Yes Yes  A1e Organizational difficulty:  No Yes  A46f Aversion / avoidance of sustained mental effort:  Yes Yes  A1g Losing things necessary for tasks/activities:  Yes DK  A1h Easily distracted by extraneous stimuli:  Yes Yes  A1i Forgetful in daily activities:  Yes Yes      Hyperactivity/Impulsivity Symptoms: Often difficulties with    A2a Fidgeting/squirming in seat: Yes Yes  A2b Remaining seated / needing to move around: No Yes  A2c Restlessness/runs or climbs when inappropriate:  Yes Yes  A2d Engaging in leisure activities quietly: ** Yes Yes  A2e Excessive energy:  No Yes  A75f Talking excessively:  No No  A2g Blurting out  answers / interrupting others:  No Yes  A2h Waiting turn:  No Yes  A2i Interruption or socially intrusive: No Yes   Possible secondary factors impacting current Sx: Subclinical depressive symptoms. Anxiety (albeit improved relative to prior year). Possible indications of poor sleep quality. OCD-like tendencies. Medication side effects. Frequent headache.  Complicating factors regarding childhood Sx: Possible greater than average premorbid cognitive abilities, which can buffer functional impact. Test anxiety.  Functional Impact: Negative impact on self-esteem due to history of social interactions and parental messaging. Difficulties with efficient task completion.    Motor/Sensory Complaints:   Sensory changes: Hearing is intact. Vision is declining slowly, likely due to age related changes.   Emotional and Behavioral Functioning:  The patient reported that her mental health has been improved relative to July. Depression Sx (Current): Denied frequent feelings of low mood/sadness, anhedonia, changes in appetite, changes in sleep not accounted for by other factors, feelings of hopelessness, excessive guilt. Denied suicidal ideation.  Anxiety Sx (Current): Endorsed some continued difficulty with generalized worry/anxiety, although significant reduced relative to last year. Some mild difficulties with relaxing and feeling tense. Panic attack frequency is substantially reduced. No panic attacks within the last six months.  Other: Denied any active PTSD symptomology. No current or past homicidal ideation, hallucination, delusions/paranoia, mania, or in-patient psychiatric stays.  Sleep: Sleeps about 8-9 hours on average per night. Minor difficulties falling asleep, but is actively working on reducing reliance on Ambien . Reported that her husband hasn't noticed her having apneic episodes while asleep. Denies waking up with headaches. Had difficulty wearing CPAP mask. Weight loss reportedly improved Sx.   Appetite: No unintentional weight loss or gain.  Caffeine: None  Alcohol Use: Minimal Tobacco Use: None Recreational Substance Use: None   Level of Functional Independence: The patient is intact with basic and instrumental activities of daily living.   Medical History/Record Review: Per records and patient report History of  traumatic brain injury/concussion: None   History of stroke: None   History of heart attack: None   History of cancer/chemotherapy:None   History of seizure activity: None   Symptoms of chronic pain: mild (2/10 in severity)   Experience of frequent headaches/migraines: Migraine headaches approximately once per month.     Past Medical History:  Diagnosis Date   ADHD    Adhesive capsulitis of right shoulder 07/23/2020   Allergy     Annual physical exam 08/19/2019   Anxiety 12/12/2015   Arthritis    Basal cell carcinoma    nose, left cheek, right upper lip, right posterior shoulder most recent 6 years ago on left cheek   Basal cell carcinoma 03/26/2021   L chest, EDC 04/24/2021   Basal cell carcinoma 07/06/2023   L pretibial, EDC   Benign essential hypertension 08/14/2014   Bleeding    Bursitis of right shoulder 12/25/2020   Chicken pox    Chronic diarrhea    Chronic intractable headache 10/20/2017   Chronic migraine w/o aura, not intractable, w/o stat migr 08/08/2020   Complication of anesthesia    blood pressure elevated after gastric sleeve surgery in recovery, pt does not want mask over face; migraines   COVID-19    03/01/20 had MAB 03/03/20 and felt better next day 03/04/20   CTS (carpal tunnel syndrome) 04/05/2020   Dr. Maree s/p TMC/Lidocaine  injection 12/05/19   Depression    Diabetes mellitus without complication (HCC)    pre-diabetic currently, was diabetic prior to gastric sleeve surgery    Diabetic polyneuropathy associated with diabetes mellitus due to underlying condition (HCC) 08/08/2020   Disorder of right rotator cuff    10/03/19 MRI right  shoulder abnormal Dr. Lorriane Blanch   Fall    08/2020   Fall    03/28/21   Fatty liver    Fatty liver 05/06/2017   US  + 2013    Gout    Hematuria 10/15/2018   History of shingles 06/19/2021   lower back and R side of thigh   Hives 03/28/2020   Hyperlipidemia    Hypertension    no problems since losing weight after gastric sleeve surgery   Hypertension 07/17/2014   IBS (irritable bowel syndrome)    Injury of right wrist 06/26/2016   Insomnia    Migraine    Neuropathy    bilateral feet   Obesity    Obesity (BMI 30-39.9) 05/06/2017   Obesity, diabetes, and hypertension syndrome (HCC) 08/18/2019   Osteopenia    Palpitations 08/14/2014   PONV (postoperative nausea and vomiting)    Squamous cell carcinoma of skin 03/26/2021   SCC IS R mid sternum, EDC 04/24/2021   Tendonitis of shoulder, right 12/25/2020   Patient Active Problem List   Diagnosis Date Noted   Attention and concentration deficit 07/29/2023   Wound of skin 06/30/2023   Allergic rhinitis 06/30/2023   Encounter for immunization 06/30/2023   Night sweats 03/20/2023   Back pain 01/04/2023   Urinary frequency 01/04/2023   Orthostatic hypotension 11/02/2022   Loss of weight 11/02/2022   Poor appetite 11/02/2022   Dysuria 11/02/2022   Chest discomfort 11/02/2022   Dizzinesses 11/02/2022   Need for influenza vaccination 11/02/2022   COVID-19 10/06/2022   Bereavement 09/23/2022   GAD (generalized anxiety disorder) 09/23/2022   Diabetic eye exam (HCC) 07/12/2022   Vitamin B 12 deficiency 07/12/2022   Mood disorder 05/21/2022   MDD (major depressive disorder), recurrent episode, mild 05/21/2022   Attention deficit  hyperactivity disorder (ADHD), combined type 05/21/2022   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/2022   Dysphagia 04/10/2022   High risk medication use 04/03/2022   Ingrown toenail without infection 02/16/2022   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   Depression 01/27/2022   Hair loss 10/02/2021   Stress 03/28/2020    Hypertension associated with diabetes (HCC) 08/18/2019   Irritable bowel syndrome 03/01/2019   Type 2 diabetes mellitus with complications (HCC) 08/31/2018   Vitamin D  deficiency 08/31/2018   Chronic migraine without aura, with intractable migraine, so stated, with status migrainosus 11/10/2017   Hyperlipidemia associated with type 2 diabetes mellitus (HCC) 12/12/2015   Obstructive sleep apnea syndrome 10/10/2014   Gastroesophageal reflux disease with hiatal hernia 09/29/2014   Insomnia 07/17/2014   Imaging/Lab Results:  06/10/23 MRI of the brain with and without contrast. Per records; IMPRESSION: Normal MRI scan of the brain with and without contrast.  Family Neurologic/Medical Hx:  Family History  Problem Relation Age of Onset   Heart disease Mother    Breast cancer Mother 61       metastatic, now hip, spine, and leg    Arthritis Mother    Hyperlipidemia Mother    Cancer Mother        spinal surgery. breast dx'ed age 67 now metastatic 49 as of 07/13/18   Cystic fibrosis Mother        carrier    Heart disease Father    Lung cancer Father        smoker   Hyperlipidemia Father    Hypertension Father    Depression Sister    Diabetes Sister    Stroke Sister    Other Sister        covid +   Diabetes Sister    Diabetes Maternal Aunt    Alcohol abuse Maternal Aunt    Diabetes Maternal Aunt    Alcohol abuse Maternal Uncle    Diabetes Maternal Uncle    Alcohol abuse Paternal Uncle    Diabetes Maternal Grandfather    Hyperlipidemia Maternal Grandfather    Arthritis Maternal Grandmother    Sudden death Cousin    Schizophrenia Cousin    Cystic fibrosis Other        great niece    Factor V Leiden deficiency Other        uncle and cousins and m GF (all maternal)   Alcohol abuse Niece    Alcohol abuse Nephew    ADD / ADHD Son    Depression Son    ADD / ADHD Son    Post-traumatic stress disorder Son    Colon cancer Neg Hx    Ovarian cancer Neg Hx    Medications:   buPROPion  (WELLBUTRIN ) 75 MG tablet celecoxib (CELEBREX) 200 MG capsule cetirizine (ZYRTEC) 10 MG chewable tablet citalopram  (CELEXA ) 40 MG tablet fexofenadine  (ALLEGRA ) 180 MG tablet fluconazole  (DIFLUCAN ) 150 MG tablet gabapentin  (NEURONTIN ) 300 MG capsule - regularly hydrOXYzine  (ATARAX ) 25 MG tablet - as needed. Rarely taken.  montelukast  (SINGULAIR ) 10 MG tablet omeprazole  (PRILOSEC) 40 MG capsule ondansetron  (ZOFRAN -ODT) 4 MG disintegrating tablet Rimegepant Sulfate (NURTEC) 75 MG TBDP Roflumilast  (ZORYVE ) 0.3 % CREA rosuvastatin  (CRESTOR ) 20 MG tablet solifenacin  (VESICARE ) 5 MG tablet  tirzepatide  (MOUNJARO ) 2.5 MG/0.5ML Pen topiramate  (TOPAMAX ) 50 MG tablet    zolpidem  (AMBIEN  CR) 6.25 MG CR tablet  Academic/Vocational History: Graduated high school. Typically earned Bs. No specific learning disorder. Historically a quick reader. Retired in 2019. 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 She  indicated it was challenging for her to sit for long periods of times. There were multiple tasks/jobs she cross-trained in but was limited in progression without a bachelors degree. She remained in the position primarily for financial reasons.   Psychosocial: Marital Status: Married 43 years.  Children/Grandchildren: 3 sons.  Living Situation: Lives alone with her husband.   Mental Status/Behavioral Observations: The patient was seen on an outpatient basis in the [facility] PM&R office for the clinical interview unaccompanied.  Sensorium/Arousal: Alert. Hearing and vision adequate for the demands of planned cognitive testing.  Orientation: Intact.  Appearance: Appropriate dress and hygiene.  Behavior: Cooperative, friendly.  Speech/Language: Conversational speech was prosodic, fluent, and well articulated.  Motor: WNL Social Comportment: Appropriate for the setting.  Mood: Euthymic Affect: Congruent.  Thought Process/Content: Coherent, linear, goal directed.   Ability to Participate in Interview: Readily answered all questions posed during the clinical interview. Some tangentiality.  Insight: Good.    SUMMARY / CLINICAL IMPRESSIONS The patient is a 63 year old woman referred for neuropsychological evaluation by her psychiatrist, Dr. Coby, on 07/29/2023.  Per records from referring provider, the patient previously had ADHD testing at [facility] and findings did not show definitive diagnosis of ADHD.  Records stated the patient was advised to have a reevaluation done once mood symptoms are better controlled.  At the time of the 07/29/2023 visit, the patient's anxiety and depression and sleep are reportedly well-managed and the patient was interested in referral for reevaluation.  Per records, the patient has diagnosis of generalized anxiety disorder, bereavement, and recurrent major depressive disorder (in remission).  Medical history includes insomnia, diabetes mellitus, polyneuropathy, tendinopathy of right biceps tendon status post right shoulder arthropathy with repair of subscapularis and supraspinatus, subacromial decompression with extensive debridement of shoulder and distal clavicle resection, chronic migraine per records.  Medical records from the patient's 2024 evaluation with [facility] was available for review.  ASRS self-report and collateral report (provided by son) endorsements generated total scores of 51 and 50, respectively, and specific item endorsements align with ADHD symptomology. Questionnaires from that evaluation noted significant difficulty with attention, memory, anxiety, and sleep.  Moderate difficulties were noted with panic and mood and milder difficulties with impulsivity, depression, fatigue, and pain.  The cognitive performance measure utilized in that evaluation stated objective testing with Qb.... Results showed statistically significant abnormalities in activity.  Attention parameters were  only abnormal out of compensation; very slow and accurate.  A formal diagnosis of ADHD was not provided although symptom criteria suggested ADHD combined type.  The authors of the report indicated suspicion that symptomology was secondary to other factors.  They note difficulties with OCD and social anxiety.  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  They indicated that once anxiety was better manage would be appropriate to undergo reevaluation.  Upon interview, the patient indicated that she is feeling much better relative to a year ago, noting significant improvements in her anxiety and mood.  She indicated that she continues to experience difficulties with ADHD-like symptomology and is interested in undergoing the evaluation.  Formal neuropsychological evaluation is indicated based on indications of ADHD related symptomatology but also comorbid psychiatric conditions. Information from the cognitive testing will be combined with the above clinical interview to determine differential diagnosis and treatment planning.   DISPOSITION / PLAN  Diagnosis: Attention and concentration deficit [R41.840]  Generalized Anxiety Disorder (GAD)  FULL REPORT TO FOLLOW   Evalene DOROTHA Riff, PsyD [facility]  PM&R-Clinical Neuropsychology [address] [place], [place] [address] Main: [phone] Fax: [phone] [place] License # [id number]  This report was generated using voice recognition software. While this document has been carefully reviewed, transcription errors may be present. I apologize in advance for any inconvenience. Please contact me if further clarification is needed.

## 2023-10-20 NOTE — Patient Instructions (Signed)
 Below is our plan:  We will continue gabapentin  up to three times daily. Start zonisamide 25mg  at bedtime. Continue Nurtec for abortive therapy. We can consider sleep study if needed. We could also consider PT for gait eval.   Please make sure you are staying well hydrated. I recommend 50-60 ounces daily. Well balanced diet and regular exercise encouraged. Consistent sleep schedule with 6-8 hours recommended.   Please continue follow up with care team as directed.   Follow up with me in 1 year   You may receive a survey regarding today's visit. I encourage you to leave honest feed back as I do use this information to improve patient care. Thank you for seeing me today!   GENERAL HEADACHE INFORMATION:   Natural supplements: Magnesium Oxide or Magnesium Glycinate 500 mg at bed (up to 800 mg daily) Coenzyme Q10 300 mg in AM Vitamin B2- 200 mg twice a day   Add 1 supplement at a time since even natural supplements can have undesirable side effects. You can sometimes buy supplements cheaper (especially Coenzyme Q10) at www.WebmailGuide.co.za or at [facility].  Migraine with aura: There is increased risk for stroke in women with migraine with aura and a contraindication for the combined contraceptive pill for use by women who have migraine with aura. The risk for women with migraine without aura is lower. However other risk factors like smoking are far more likely to increase stroke risk than migraine. There is a recommendation for no smoking and for the use of OCPs without estrogen such as progestogen only pills particularly for women with migraine with aura.SABRA People who have migraine headaches with auras may be 3 times more likely to have a stroke caused by a blood clot, compared to migraine patients who don't see auras. Women who take hormone-replacement therapy may be 30 percent more likely to suffer a clot-based stroke than women not taking medication containing estrogen. Other risk factors like smoking and  high blood pressure may be  much more important.    Vitamins and herbs that show potential:   Magnesium: Magnesium (250 mg twice a day or 500 mg at bed) has a relaxant effect on smooth muscles such as blood vessels. Individuals suffering from frequent or daily headache usually have low magnesium levels which can be increase with daily supplementation of 400-750 mg. Three trials found 40-90% average headache reduction  when used as a preventative. Magnesium may help with headaches are aura, the best evidence for magnesium is for migraine with aura is its thought to stop the cortical spreading depression we believe is the pathophysiology of migraine aura.Magnesium also demonstrated the benefit in menstrually related migraine.  Magnesium is part of the messenger system in the serotonin cascade and it is a good muscle relaxant.  It is also useful for constipation which can be a side effect of other medications used to treat migraine. Good sources include nuts, whole grains, and tomatoes. Side Effects: loose stool/diarrhea  Riboflavin (vitamin B 2) 200 mg twice a day. This vitamin assists nerve cells in the production of ATP a principal energy storing molecule.  It is necessary for many chemical reactions in the body.  There have been at least 3 clinical trials of riboflavin using 400 mg per day all of which suggested that migraine frequency can be decreased.  All 3 trials showed significant improvement in over half of migraine sufferers.  The supplement is found in bread, cereal, milk, meat, and poultry.  Most Americans get more riboflavin than  the recommended daily allowance, however riboflavin deficiency is not necessary for the supplements to help prevent headache. Side effects: energizing, green urine   Coenzyme Q10: This is present in almost all cells in the body and is critical component for the conversion of energy.  Recent studies have shown that a nutritional supplement of CoQ10 can reduce the frequency  of migraine attacks by improving the energy production of cells as with riboflavin.  Doses of 150 mg twice a day have been shown to be effective.   Melatonin: Increasing evidence shows correlation between melatonin secretion and headache conditions.  Melatonin supplementation has decreased headache intensity and duration.  It is widely used as a sleep aid.  Sleep is natures way of dealing with migraine.  A dose of 3 mg is recommended to start for headaches including cluster headache. Higher doses up to 15 mg has been reviewed for use in Cluster headache and have been used. The rationale behind using melatonin for cluster is that many theories regarding the cause of Cluster headache center around the disruption of the normal circadian rhythm in the brain.  This helps restore the normal circadian rhythm.   HEADACHE DIET: Foods and beverages which may trigger migraine Note that only 20% of headache patients are food sensitive. You will know if you are food sensitive if you get a headache consistently 20 minutes to 2 hours after eating a certain food. Only cut out a food if it causes headaches, otherwise you might remove foods you enjoy! What matters most for diet is to eat a well balanced healthy diet full of vegetables and low fat protein, and to not miss meals.   Chocolate, other sweets ALL cheeses except cottage and cream cheese Dairy products, yogurt, sour cream, ice cream Liver Meat extracts (Bovril, Marmite, meat tenderizers) Meats or fish which have undergone aging, fermenting, pickling or smoking. These include: Hotdogs,salami,Lox,sausage, mortadellas,smoked salmon, pepperoni, Pickled herring Pods of broad bean (English beans, Chinese pea pods, [place] (fava) beans, lima and navy beans Ripe avocado, ripe banana Yeast extracts or active yeast preparations such as Brewer's or Fleishman's (commercial bakes goods are permitted) Tomato based foods, pizza (lasagna, etc.)   MSG (monosodium  glutamate) is disguised as many things; look for these common aliases: Monopotassium glutamate Autolysed yeast Hydrolysed protein Sodium caseinate "flavorings" "all natural preservatives Nutrasweet   Avoid all other foods that convincingly provoke headaches.   Resources: The Dizzy Bluford Aid Your Headache Diet, migrainestrong.com  https://zamora-andrews.com/   Caffeine and Migraine For patients that have migraine, caffeine intake more than 3 days per week can lead to dependency and increased migraine frequency. I would recommend cutting back on your caffeine intake as best you can. The recommended amount of caffeine is 200-300 mg daily, although migraine patients may experience dependency at even lower doses. While you may notice an increase in headache temporarily, cutting back will be helpful for headaches in the long run. For more information on caffeine and migraine, visit: https://americanmigrainefoundation.org/resource-library/caffeine-and-migraine/   Headache Prevention Strategies:   1. Maintain a headache diary; learn to identify and avoid triggers.  - This can be a simple note where you log when you had a headache, associated symptoms, and medications used - There are several smartphone apps developed to help track migraines: Migraine Buddy, Migraine Monitor, Curelator N1-Headache App   Common triggers include: Emotional triggers: Emotional/Upset family or friends Emotional/Upset occupation Business reversal/success Anticipation anxiety Crisis-serious Post-crisis periodNew job/position   Physical triggers: Vacation Day Weekend Strenuous Exercise [place] Altitude Location  New Move Menstrual Day Physical Illness Oversleep/Not enough sleep Weather changes Light: Photophobia or light sesnitivity treatment involves a balance between desensitization and reduction in overly strong input. Use dark polarized glasses outside, but  not inside. Avoid bright or fluorescent light, but do not dim environment to the point that going into a normally lit room hurts. Consider FL-41 tint lenses, which reduce the most irritating wavelengths without blocking too much light.  These can be obtained at axonoptics.com or theraspecs.com Foods: see list above.   2. Limit use of acute treatments (over-the-counter medications, triptans, etc.) to no more than 2 days per week or 10 days per month to prevent medication overuse headache (rebound headache).     3. Follow a regular schedule (including weekends and holidays): Don't skip meals. Eat a balanced diet. 8 hours of sleep nightly. Minimize stress. Exercise 30 minutes per day. Being overweight is associated with a 5 times increased risk of chronic migraine. Keep well hydrated and drink 6-8 glasses of water  per day.   4. Initiate non-pharmacologic measures at the earliest onset of your headache. Rest and quiet environment. Relax and reduce stress. Breathe2Relax is a free app that can instruct you on    some simple relaxtion and breathing techniques. Http://Dawnbuse.com is a    free website that provides teaching videos on relaxation.  Also, there are  many apps that   can be downloaded for "mindful" relaxation.  An app called YOGA NIDRA will help walk you through mindfulness. Another app called Calm can be downloaded to give you a structured mindfulness guide with daily reminders and skill development. Headspace for guided meditation Mindfulness Based Stress Reduction Online Course: www.palousemindfulness.com Cold compresses.   5. Don't wait!! Take the maximum allowable dosage of prescribed medication at the first sign of migraine.   6. Compliance:  Take prescribed medication regularly as directed and at the first sign of a migraine.   7. Communicate:  Call your physician when problems arise, especially if your headaches change, increase in frequency/severity, or become associated with  neurological symptoms (weakness, numbness, slurred speech, etc.). Proceed to emergency room if you experience new or worsening symptoms or symptoms do not resolve, if you have new neurologic symptoms or if headache is severe, or for any concerning symptom.   8. Headache/pain management therapies: Consider various complementary methods, including medication, behavioral therapy, psychological counselling, biofeedback, massage therapy, acupuncture, dry needling, and other modalities.  Such measures may reduce the need for medications. Counseling for pain management, where patients learn to function and ignore/minimize their pain, seems to work very well.   9. Recommend changing family's attention and focus away from patient's headaches. Instead, emphasize daily activities. If first question of day is 'How are your headaches/Do you have a headache today?', then patient will constantly think about headaches, thus making them worse. Goal is to re-direct attention away from headaches, toward daily activities and other distractions.   10. Helpful Websites: www.AmericanHeadacheSociety.org PatentHood.ch www.headaches.org TightMarket.nl www.achenet.org

## 2023-10-20 NOTE — Progress Notes (Unsigned)
 PATIENT: Kristin [name] DOB: March 17, 1960  REASON FOR VISIT: follow up HISTORY FROM: patient  No chief complaint on file.    HISTORY OF PRESENT ILLNESS:  10/20/23 ALL: [name] returns for follow up for migraines and DM neuropathy. She was last seen 05/2023. We continued topiramate  and increased gabapentin  to 300mg  TID. Since,   06/05/2023 ALL:  [name] returns for follow up. She was last seen 10/2022 and reported worsening migraines. We opted to focus on healthy lifestyle habits and follow up if no improvement. She called yesterday reporting worsening headaches over the past two months with new symptoms. She describes pain as a tingling sensation of head, worse around left ear. Like spiders are crawling on her head. Worse in mid afternoon. Saw dermatology but referred back to us . No rash.   She has continued topiramate  50mg  daily and gabapentin  300mg  BID. Gabapentin  prescribed TID. Neuropathy is stable. She feels balance is worse. She does use a cane. She had 1 fall a few months ago. She leaned forward to pick some thing up and fell over. She did not have her cane. She is currently working with PT.   She is under more stress. Still having significant anxiety. Psych increased citalopram  to 40mg  in 01/2023. Depression seems better. She is followed regularly by psych. Her son returned from [employer] and plans to [place] . She drinks about 40-50 ounces of water  daily. She is sleeping well but takes Ambien  CR 6.25. She has B12 and vitamin D  def. Followed by PCP. On supplements.   11/13/2022 ALL: [name] returns for follow up for migraines and diabetic neuropathy. She was last seen 02/2022. We continued gabapentin  and Nurtec and restarted topiramate  50mg  daily. Since, she reports symptoms have gotten worse over the past few months. She lost her mom 09/2022. She had been sick over the past year but worsened over the summer. Migraines worsened during this period. She averages about 3-4 migraines per month,  previously down to 1-2 every few months. She continues topiramate  50mg  daily. Nurtec works well for abortive therapy.   She is having more numbness of both feet. She gets nervous driving as she is concerned she doesn't feel the pedals as she used to. Last A1C 5.2. She continues to have muscle cramps at random times. Gabapentin  helps. Prescribed 300mg  TID but usually takes 1-2 times daily.  03/05/2022 ALL: [name] returns for follow up for migraines and diabetic neuropathy. She was last seen 01/2021 and doing failry well. She reports discontinuing topiramate  at some point over the past year. PCP also stopped amitriptyline  due to dizziness over the summer of 2023. She has continued Nurtec PRN. She reports daily tension style headaches have worsened. She has about 2 migraines per month.   Gabapentin  300mg , usually just twice daily. She feels pain was significantly improved for a while but has gotten a little worse over the past two months. She reports some tingling but no pins and needles. Muscle cramps are much better.  She feels numbness is worse. She is having trouble with balance. She feels that she is falling forward at times. She has had two falls. Fortunately, no significant injuries. She is not using an assistive device.   She has had two surgeries on the right shoulder over the past year. She just finished PT.   02/07/2021 ALL: [name] returns for follow up for migraines and diabetic polyneuropathy. She continues topiramate  50mg  BID and Nurtec as needed. Also on amitriptyline  25mg  at bedtime for sleep managed by psychiatry.  She feels that she is doing ok. Headaches are usually well managed. She did have a migraine that lasted about 2 days around Christmas and she contributes to stress. She thinks it was about 2-3 months since she had to use abortive meds.   Dr Ines started gabapentin  300mg  TID and advised she consider alpha lipoic acid as well as lidocaine  and capsaicin OTC. She has only taken  gabapentin  at night due to sleepiness. She did not try OTC meds. She does continue to have numbness/tingling of both hands and feet. She feels that right hand may be a little weaker than left. Has trouble holding glass or coffee cups. Does well with plastic cups. She also reports new numbness of posterior right thigh. Worse when sitting. She has noticed for a couple of weeks. No lower extremity weakness. She does have intermittent low back pain.   She has lost 20 pounds since 07/2020, 50lbs over the past year. A1C now 6.2. She is taking Mounjaro  and tolerating well.    08/07/2020 AA: She has very poor balance, she feels like she may keep going forward when stands, when she walks down the grocery isle she feels like she has to hold onto her husband. Pins and needles, cramping in the feet, she can be sitting, walking, in bed it is more hte cramps int he back of the legs and the arch of the foot, K+ pills helped with the cramps. Slowly progressive. Ongoing for over a year.    HgbA1c 6/22 7.4  04/05/20 8.3, 03/2020 B12 383, 03/2020 tsh 2.49   Interval history 11/10/2017: MR of the brain showed frontal chronic sinusitis, discussed seeing ENT. Propranolol  is helping, the headaches are not as bad. She has been tested in the past recently 3 years ago and no sleep apnea.  She is under more stress now. Her pulse is 60 cannot increase propranolol  further. Discussed options, due to stress may start Prozac  which is a migraine preventative and can help with stress. Zofran  for dizziness or nausea. Also start Emgality .    Until this past week has been doing great with the migraines but this one has on ongoing going for days will give you a torado Toradol  l injection at that . tried: propranolol , topiramate , prozac , nortriptyline , verapamil   REVIEW OF SYSTEMS: Out of a complete 14 system review of symptoms, the patient complains only of the following symptoms, headaches, anxiety, low back pain,  numbness/tingling, insomnia and all other reviewed systems are negative.   ALLERGIES: Allergies  Allergen Reactions   Tetracyclines & Related Swelling and Rash    FACE SWELL, RASH ON CHEST   Amoxicillin     Alcohol Rash    ABDOMINAL CRAMPS, drinking alcohol   Azithromycin  Rash   Metoprolol  Diarrhea    HOME MEDICATIONS: Outpatient Medications Prior to Visit  Medication Sig Dispense Refill   buPROPion  (WELLBUTRIN ) 75 MG tablet Take 1 tablet (75 mg total) by mouth in the morning. 90 tablet 1   cetirizine (ZYRTEC) 10 MG chewable tablet Chew 10 mg by mouth daily.     citalopram  (CELEXA ) 40 MG tablet Take 1 tablet (40 mg total) by mouth daily. 90 tablet 1   clonazePAM  (KLONOPIN ) 0.5 MG tablet Take 0.5-1 tablets (0.25-0.5 mg total) by mouth daily as needed for anxiety. Limited supply 10 tablet 0   fexofenadine  (ALLEGRA ) 180 MG tablet Take 1 tablet (180 mg total) by mouth daily as needed for allergies or rhinitis. 90 tablet 3   fluconazole  (DIFLUCAN ) 150 MG  tablet Take 1 tablet (150 mg total) by mouth once a week. 2 tablet 0   gabapentin  (NEURONTIN ) 300 MG capsule TAKE 1 CAPSULE BY MOUTH 3 TIMES DAILY AS NEEDED. 270 capsule 1   hydrOXYzine  (ATARAX ) 25 MG tablet TAKE 0.5-1 TABLETS (12.5-25 MG TOTAL) BY MOUTH 3 (THREE) TIMES DAILY AS NEEDED FOR ANXIETY (AND SLEEP). 270 tablet 1   montelukast  (SINGULAIR ) 10 MG tablet TAKE 1 TABLET BY MOUTH EVERYDAY AT BEDTIME 90 tablet 3   omeprazole  (PRILOSEC) 40 MG capsule Take 1 capsule (40 mg total) by mouth daily. 90 capsule 3   ondansetron  (ZOFRAN -ODT) 4 MG disintegrating tablet Take 1 tablet (4 mg total) by mouth every 8 (eight) hours as needed. for nausea 20 tablet 2   Rimegepant Sulfate (NURTEC) 75 MG TBDP Take 1 tablet (75 mg total) by mouth daily as needed (take for abortive therapy of migraine, no more than 1 tablet in 24 hours or 10 per month). 10 tablet 2   Roflumilast  (ZORYVE ) 0.3 % CREA Apply topically to dry patches at face once daily prn 60 g 4    rosuvastatin  (CRESTOR ) 20 MG tablet Take 1 tablet (20 mg total) by mouth daily. 90 tablet 2   solifenacin  (VESICARE ) 5 MG tablet Take 1 tablet (5 mg total) by mouth daily. 90 tablet 1   tirzepatide  (MOUNJARO ) 2.5 MG/0.5ML Pen Every other week 6 mL 1   topiramate  (TOPAMAX ) 50 MG tablet Take 1 tablet (50 mg total) by mouth daily. 90 tablet 3   zolpidem  (AMBIEN  CR) 6.25 MG CR tablet Take 2 tablets (12.5 mg total) by mouth at bedtime as needed for sleep. 60 tablet 3   No facility-administered medications prior to visit.    PAST MEDICAL HISTORY: Past Medical History:  Diagnosis Date   ADHD    Adhesive capsulitis of right shoulder 07/23/2020   Allergy     Annual physical exam 08/19/2019   Anxiety 12/12/2015   Arthritis    Basal cell carcinoma    nose, left cheek, right upper lip, right posterior shoulder most recent 6 years ago on left cheek   Basal cell carcinoma 03/26/2021   L chest, [facility] 04/24/2021   Basal cell carcinoma 07/06/2023   L pretibial, [facility]   Benign essential hypertension 08/14/2014   Bleeding    Bursitis of right shoulder 12/25/2020   Chicken pox    Chronic diarrhea    Chronic intractable headache 10/20/2017   Chronic migraine w/o aura, not intractable, w/o stat migr 08/08/2020   Complication of anesthesia    blood pressure elevated after gastric sleeve surgery in recovery, pt does not want mask over face; migraines   COVID-19    03/01/20 had MAB 03/03/20 and felt better next day 03/04/20   CTS (carpal tunnel syndrome) 04/05/2020   Dr. Maree s/p [facility]/Lidocaine  injection 12/05/19   Depression    Diabetes mellitus without complication (HCC)    pre-diabetic currently, was diabetic prior to gastric sleeve surgery    Diabetic polyneuropathy associated with diabetes mellitus due to underlying condition (HCC) 08/08/2020   Disorder of right rotator cuff    10/03/19 MRI right shoulder abnormal Dr. Lorriane Blanch   Fall    08/2020   Fall    03/28/21   Fatty liver    Fatty liver  05/06/2017   US  + 2013    Gout    Hematuria 10/15/2018   History of shingles 06/19/2021   lower back and R side of thigh   Hives 03/28/2020   Hyperlipidemia  Hypertension    no problems since losing weight after gastric sleeve surgery   Hypertension 07/17/2014   IBS (irritable bowel syndrome)    Injury of right wrist 06/26/2016   Insomnia    Migraine    Neuropathy    bilateral feet   Obesity    Obesity (BMI 30-39.9) 05/06/2017   Obesity, diabetes, and hypertension syndrome (HCC) 08/18/2019   Osteopenia    Palpitations 08/14/2014   PONV (postoperative nausea and vomiting)    Squamous cell carcinoma of skin 03/26/2021   SCC IS R mid sternum, [facility] 04/24/2021   Tendonitis of shoulder, right 12/25/2020    PAST SURGICAL HISTORY: Past Surgical History:  Procedure Laterality Date   ABDOMINAL HYSTERECTOMY     no h/o abnormal pap still has 1 ovary left   ANKLE ARTHROSCOPY Left 05/29/2016   Procedure: ANKLE ARTHROSCOPY;  Surgeon: Janit Thresa HERO, DPM;  Location: [facility];  Service: Podiatry;  Laterality: Left;   APPENDECTOMY     basal cell removed     CESAREAN SECTION N/A    1985 & 1995   CHOLECYSTECTOMY     CLOSED MANIPULATION SHOULDER WITH STERIOD INJECTION Right 03/31/2022   Procedure: MANIPULATION UNDER ANESTHESIA WITH GLENOHUMERAL AND SUBACROMIAL CORTICOSTERIOD INJECTION;  Surgeon: Tobie Priest, MD;  Location: [facility] ORS;  Service: Orthopedics;  Laterality: Right;   COLONOSCOPY WITH PROPOFOL  N/A 01/31/2019   Procedure: COLONOSCOPY WITH PROPOFOL ;  Surgeon: Unk Corinn Skiff, MD;  Location: [facility] ENDOSCOPY;  Service: Gastroenterology;  Laterality: N/A;   cyst removed from wrist     ESOPHAGOGASTRODUODENOSCOPY (EGD) WITH PROPOFOL  N/A 01/31/2019   Procedure: ESOPHAGOGASTRODUODENOSCOPY (EGD) WITH PROPOFOL ;  Surgeon: Unk Corinn Skiff, MD;  Location: [facility] ENDOSCOPY;  Service: Gastroenterology;  Laterality: N/A;   ESOPHAGOGASTRODUODENOSCOPY (EGD) WITH PROPOFOL  N/A 05/20/2022   Procedure:  ESOPHAGOGASTRODUODENOSCOPY (EGD) WITH PROPOFOL ;  Surgeon: Unk Corinn Skiff, MD;  Location: [facility] ENDOSCOPY;  Service: Gastroenterology;  Laterality: N/A;   ESOPHAGOGASTRODUODENOSCOPY (EGD) WITH PROPOFOL  N/A 09/24/2022   Procedure: ESOPHAGOGASTRODUODENOSCOPY (EGD) WITH PROPOFOL ;  Surgeon: Unk Corinn Skiff, MD;  Location: [facility] ENDOSCOPY;  Service: Gastroenterology;  Laterality: N/A;   FLEXIBLE SIGMOIDOSCOPY N/A 05/21/2020   Procedure: FLEXIBLE SIGMOIDOSCOPY;  Surgeon: Unk Corinn Skiff, MD;  Location: [facility] ENDOSCOPY;  Service: Gastroenterology;  Laterality: N/A;   FOOT SURGERY     left foot 11/2016 Dr. Rainell Janit    left elbow surgery     07/2017 Dr Helayne Glenn epicondylitis    LYSIS OF ADHESION Right 08/27/2021   Procedure: Right shoulder arthroscopic lysis of adhesions, capsular release, and manipulation under anesthesia with corticosteroid injection and biceps tenodesis;  Surgeon: Tobie Priest, MD;  Location: [facility];  Service: Orthopedics;  Laterality: Right;   MOHS SURGERY     x 2 face nose and left cheek    OSTEOCHONDROMA EXCISION Left 05/29/2016   Procedure: OSTEOCHONDRAL DRILLING TALUS;  Surgeon: Janit Thresa HERO, DPM;  Location: [facility];  Service: Podiatry;  Laterality: Left;   PLANTAR FASCIA RELEASE Left 05/29/2016   Procedure: ENDOSCOPIC PLANTAR FASCIOTOMY;  Surgeon: Janit Thresa HERO, DPM;  Location: [facility];  Service: Podiatry;  Laterality: Left;   ROTATOR CUFF REPAIR Left    SHOULDER ARTHROSCOPY WITH BICEPSTENOTOMY Right 03/31/2022   Procedure: SHOULDER ARTHROSCOPY WITH BICEPS TENOTOMY, ARTHROSCOPIC LYSIS OF ADHESIONS, AND CAPSULAR RELEASE, AND EXTENSIVE GLENOHUMERAL DEBRIDEMENT;  Surgeon: Tobie Priest, MD;  Location: [facility] OR[facility];  Service: Orthopedics;  Laterality: Right;   SHOULDER ARTHROSCOPY WITH ROTATOR CUFF REPAIR  03/31/2022   Procedure: SHOULDER ARTHROSCOPY WITH ROTATOR CUFF  REPAIR;  Surgeon: Tobie Priest, MD;  Location: [facility] OR[facility];  Service: Orthopedics;;   STOMACH  SURGERY  2017   gastric sleeve surgery   TENNIS ELBOW RELEASE/NIRSCHEL PROCEDURE Left 08/05/2017   Procedure: TENNIS ELBOW RELEASE/NIRSCHEL PROCEDURE;  Surgeon: Maryl Barters, MD;  Location: [facility] ORS;  Service: Orthopedics;  Laterality: Left;   TUBAL LIGATION     wedge resection of ovary  1982    FAMILY HISTORY: Family History  Problem Relation Age of Onset   Heart disease Mother    Breast cancer Mother 88       metastatic, now hip, spine, and leg    Arthritis Mother    Hyperlipidemia Mother    Cancer Mother        spinal surgery. breast dx'ed age 49 now metastatic 48 as of 07/13/18   Cystic fibrosis Mother        carrier    Heart disease Father    Lung cancer Father        smoker   Hyperlipidemia Father    Hypertension Father    Depression Sister    Diabetes Sister    Stroke Sister    Other Sister        covid +   Diabetes Sister    Diabetes Maternal Aunt    Alcohol abuse Maternal Aunt    Diabetes Maternal Aunt    Alcohol abuse Maternal Uncle    Diabetes Maternal Uncle    Alcohol abuse Paternal Uncle    Diabetes Maternal Grandfather    Hyperlipidemia Maternal Grandfather    Arthritis Maternal Grandmother    Sudden death Cousin    Schizophrenia Cousin    Cystic fibrosis Other        great niece    Factor V Leiden deficiency Other        uncle and cousins and m GF (all maternal)   Alcohol abuse Niece    Alcohol abuse Nephew    ADD / ADHD Son    Depression Son    ADD / ADHD Son    Post-traumatic stress disorder Son    Colon cancer Neg Hx    Ovarian cancer Neg Hx     SOCIAL HISTORY: Social History   Socioeconomic History   Marital status: Married    Spouse name: Not on file   Number of children: 3   Years of education: Not on file   Highest education level: 12th grade  Occupational History   Not on file  Tobacco Use   Smoking status: Never   Smokeless tobacco: Never  Vaping Use   Vaping status: Never Used  Substance and Sexual Activity   Alcohol  use: Never   Drug use: Never   Sexual activity: Yes    Partners: Male    Birth control/protection: Surgical  Other Topics Concern   Not on file  Social History Narrative   Married x 37 years as of 10/2017    Kids 3 boys youngest is Deward   Write childrens books    Retired from [employer] 01/2017    Enjoys traveling    Right handed   Caffeine: quit 2009   Social Drivers of [employer] Strain: Low Risk  (09/14/2023)   Overall Financial Resource Strain (CARDIA)    Difficulty of Paying Living Expenses: Not very hard  Food Insecurity: No Food Insecurity (09/14/2023)   Hunger Vital Sign    Worried About Running Out of Food in the Last Year: Never true  Ran Out of Food in the Last Year: Never true  Transportation Needs: Unmet Transportation Needs (09/14/2023)   PRAPARE - Administrator, Civil Service (Medical): Yes    Lack of Transportation (Non-Medical): No  Physical Activity: Insufficiently Active (09/14/2023)   Exercise Vital Sign    Days of Exercise per Week: 1 day    Minutes of Exercise per Session: 30 min  Stress: Stress Concern Present (09/14/2023)   [employer] of Occupational Health - Occupational Stress Questionnaire    Feeling of Stress: Rather much  Social Connections: Moderately Isolated (09/14/2023)   Social Connection and Isolation Panel    Frequency of Communication with Friends and Family: More than three times a week    Frequency of Social Gatherings with Friends and Family: Once a week    Attends Religious Services: Patient declined    Database administrator or Organizations: No    Attends Engineer, structural: Not on file    Marital Status: Married  Catering manager Violence: Not on file      PHYSICAL EXAM  There were no vitals filed for this visit.      There is no height or weight on file to calculate BMI.  Generalized: Well developed, in no acute distress  Cardiology: normal rate and rhythm, no murmur  noted Respiratory: Clear to auscultation bilaterally  Neurological examination  Mentation: Alert oriented to time, place, history taking. Follows all commands speech and language fluent Cranial nerve II-XII: Pupils were equal round reactive to light. Extraocular movements were full, visual field were full on confrontational test. Facial sensation and strength were normal. Head turning and shoulder shrug  were normal and symmetric. Motor: The motor testing reveals 5 over 5 strength of all 4 extremities. Grip strength is normal. Good symmetric motor tone is noted throughout.  Sensory: Sensory testing is intact to soft touch, pinprick and vibration on all 4 extremities with exception of decreased vibratory sensation of left lateral malleolus (prior ankle surgery) No evidence of extinction is noted.  Coordination: Cerebellar testing reveals good finger-nose-finger and heel-to-shin bilaterally.  Gait and station: Gait is normal.   DIAGNOSTIC DATA (LABS, IMAGING, TESTING) - I reviewed patient records, labs, notes, testing and imaging myself where available.      No data to display           Lab Results  Component Value Date   WBC 5.8 03/16/2023   HGB 13.4 03/16/2023   HCT 40.6 03/16/2023   MCV 91.5 03/16/2023   PLT 217.0 03/16/2023      Component Value Date/Time   NA 141 03/16/2023 0952   NA 141 05/05/2019 0838   NA 139 10/24/2012 1218   K 4.4 03/16/2023 0952   K 3.9 10/24/2012 1218   CL 107 03/16/2023 0952   CL 103 10/24/2012 1218   CO2 27 03/16/2023 0952   CO2 29 10/24/2012 1218   GLUCOSE 90 03/16/2023 0952   GLUCOSE 103 (H) 10/24/2012 1218   BUN 19 03/16/2023 0952   BUN 11 05/05/2019 0838   BUN 12 10/24/2012 1218   CREATININE 0.81 03/16/2023 0952   CREATININE 0.67 10/24/2012 1218   CALCIUM  9.3 03/16/2023 0952   CALCIUM  9.0 10/24/2012 1218   PROT 6.5 03/16/2023 0952   PROT 7.4 10/15/2020 1356   PROT 7.4 10/24/2012 1218   ALBUMIN 4.3 03/16/2023 0952   ALBUMIN 4.7  10/15/2020 1356   ALBUMIN 3.2 (L) 10/24/2012 1218   AST 23 03/16/2023 0952   AST  14 (L) 10/24/2012 1218   ALT 25 03/16/2023 0952   ALT 22 10/24/2012 1218   ALKPHOS 61 03/16/2023 0952   ALKPHOS 99 10/24/2012 1218   BILITOT 0.4 03/16/2023 0952   BILITOT 0.3 10/15/2020 1356   BILITOT 0.2 10/24/2012 1218   GFRNONAA >60 08/18/2022 1728   GFRNONAA >60 10/24/2012 1218   GFRAA 110 05/05/2019 0838   GFRAA >60 10/24/2012 1218   Lab Results  Component Value Date   CHOL 151 07/03/2022   HDL 59.20 07/03/2022   LDLCALC 77 07/03/2022   LDLDIRECT 48.0 11/08/2020   TRIG 70.0 07/03/2022   CHOLHDL 3 07/03/2022   Lab Results  Component Value Date   HGBA1C 5.5 03/16/2023   Lab Results  Component Value Date   VITAMINB12 419 07/03/2022   Lab Results  Component Value Date   TSH 3.58 03/16/2023       ASSESSMENT AND PLAN 63 y.o. year old female  has a past medical history of ADHD, Adhesive capsulitis of right shoulder (07/23/2020), Allergy , Annual physical exam (08/19/2019), Anxiety (12/12/2015), Arthritis, Basal cell carcinoma, Basal cell carcinoma (03/26/2021), Basal cell carcinoma (07/06/2023), Benign essential hypertension (08/14/2014), Bleeding, Bursitis of right shoulder (12/25/2020), Chicken pox, Chronic diarrhea, Chronic intractable headache (10/20/2017), Chronic migraine w/o aura, not intractable, w/o stat migr (08/08/2020), Complication of anesthesia, COVID-19, CTS (carpal tunnel syndrome) (04/05/2020), Depression, Diabetes mellitus without complication (HCC), Diabetic polyneuropathy associated with diabetes mellitus due to underlying condition (HCC) (08/08/2020), Disorder of right rotator cuff, Fall, Fall, Fatty liver, Fatty liver (05/06/2017), Gout, Hematuria (10/15/2018), History of shingles (06/19/2021), Hives (03/28/2020), Hyperlipidemia, Hypertension, Hypertension (07/17/2014), IBS (irritable bowel syndrome), Injury of right wrist (06/26/2016), Insomnia, Migraine, Neuropathy, Obesity,  Obesity (BMI 30-39.9) (05/06/2017), Obesity, diabetes, and hypertension syndrome (HCC) (08/18/2019), Osteopenia, Palpitations (08/14/2014), PONV (postoperative nausea and vomiting), Squamous cell carcinoma of skin (03/26/2021), and Tendonitis of shoulder, right (12/25/2020). here with   No diagnosis found.    [name] reports that headaches have worsened and now having new dysesthesias of left ear. Will order MRI for eval. We will continue topiramate  50mg  daily for prevention and Nurtec as needed for abortive therapy. She will continue gabapentin  300mg  but increase dose to three times daily for new dysesthesias and DM neuropathy. Alpha lipoic acid, lidocaine  and capsaicin could also provide relief if needed. Continue PT . She will work on adequate hydration, well balanced diet and regular exercise. She will return for follow up in 6 months. She verbalizes understanding and agreement with this plan.    No orders of the defined types were placed in this encounter.    No orders of the defined types were placed in this encounter.     Greig Forbes, FNP-C 10/20/2023, 8:50 AM [facility] [address] [place], [place] [address] [phone]

## 2023-10-21 ENCOUNTER — Ambulatory Visit: Payer: BC Managed Care – PPO | Admitting: Family Medicine

## 2023-10-21 ENCOUNTER — Encounter: Payer: Self-pay | Admitting: Family Medicine

## 2023-10-21 VITALS — BP 118/70 | HR 70 | Ht 62.0 in | Wt 127.6 lb

## 2023-10-21 DIAGNOSIS — R2689 Other abnormalities of gait and mobility: Secondary | ICD-10-CM | POA: Diagnosis not present

## 2023-10-21 DIAGNOSIS — E0842 Diabetes mellitus due to underlying condition with diabetic polyneuropathy: Secondary | ICD-10-CM

## 2023-10-21 DIAGNOSIS — G43709 Chronic migraine without aura, not intractable, without status migrainosus: Secondary | ICD-10-CM | POA: Diagnosis not present

## 2023-10-21 MED ORDER — GABAPENTIN 300 MG PO CAPS: ORAL_CAPSULE | ORAL | 3 refills | Status: AC

## 2023-10-21 MED ORDER — ZONISAMIDE 25 MG PO CAPS: 25.0000 mg | ORAL_CAPSULE | Freq: Every day | ORAL | 3 refills | Status: AC

## 2023-10-21 MED ORDER — TOPIRAMATE 50 MG PO TABS: 50.0000 mg | ORAL_TABLET | Freq: Every day | ORAL | 3 refills | Status: DC

## 2023-11-09 ENCOUNTER — Ambulatory Visit

## 2023-11-11 ENCOUNTER — Encounter: Attending: Psychology

## 2023-11-11 DIAGNOSIS — R41840 Attention and concentration deficit: Secondary | ICD-10-CM | POA: Diagnosis not present

## 2023-11-11 DIAGNOSIS — F419 Anxiety disorder, unspecified: Secondary | ICD-10-CM | POA: Diagnosis not present

## 2023-11-11 NOTE — Progress Notes (Signed)
 Mental Status/Behavioral Observations (11/11/2023):  Orientation: The patient was oriented to self and place. She was oriented to time with the exception of the exact day of the month (off by one day). Sensory/Arousal: Hearing and vision were adequate for testing. The patient was alert. Appearance: Dress and hygiene were appropriate for the setting.   Speech/Language: In conversation, the patient's speech was prosodic, fluent, and well-articulated. The patient displayed no indications of word finding difficulties and no word substitution errors were observed.  Motor: The patient ambulated independently and without issue. No tremors were observed.   Social Comportment: Social behavior was appropriate to the setting. Mood/Affect: Mood was largely neutral to positive. Affect was consistent with mood.  Attention/Concentration:  The patient appeared to maintain consistent engagement throughout testing. No frank attentional lapses were observed.  Thought Process/Content: The patient's thought process was coherent, linear, goal directed. There were no indications of psychosis.  Additional Observations: The patient showed no difficulties with understanding task instructions. No difficulties with frustration tolerance were noted.   Neuropsychology Note Dyamond Tolosa Barb completed 175 minutes of neuropsychological testing with technician, Josue Ned, BA, under the supervision of Evalene Riff, PsyD., Clinical Neuropsychologist. The patient did not appear overtly distressed by the testing session, per behavioral observation or via self-report to the technician. Rest breaks were offered.   Clinical Decision Making: In considering the patient's current level of functioning, level of presumed impairment, nature of symptoms, emotional and behavioral responses during clinical interview, level of literacy, and observed level of motivation/effort, a battery of tests was selected by Dr. Riff during initial  consultation on 10/15/2023. This was communicated to the technician. Communication between the neuropsychologist and technician was ongoing throughout the testing session and changes were made as deemed necessary based on patient performance on testing, technician observations and additional pertinent factors such as those listed above.  Tests Administered: Automatic Data Edition (BNT-2) Brief Visuospatial Memory Test-Revised (BVMT-R) Benton Judgment of Line Orientation (JOLO) [place]  Verbal Learning Test-Third Edition (CVLT-3) Clock Drawing Test Conners Continuous Performance Test 3rd Edition (CPT-3) Controlled Oral Word Association Test (FAS & Animals) Delis-Kaplan Executive Function System (D-KEFS), select subtests Rey Complex Figure Test (RCFT), select subtests Trail Making Test (TMT; Part A & B) Wechsler Adult Intelligence Scale-Fourth Edition (WAIS-IV), select subtests Wechsler Abbreviated Scale of Intelligence CIVIL SERVICE FAST STREAMER) Wechsler Memory Scale-Fourth Edition (WMS-IV) , select subtests The Beck Depression Inventory-II (BDI-II) Beck Anxiety Inventory (BAI)   Results: Note: This summary of test scores accompanies the interpretive report and should not be interpreted by unqualified individuals or in isolation without reference to the report. Test scores are relative to age, gender, and educational history as available and appropriate. Measurement properties of test scores: IQ, Index, and Standard Scores (SS): Mean = 100; Standard Deviation = 15; Scaled Scores (ss): Mean = 10; Standard Deviation = 3; Z scores (Z): Mean = 0; Standard Deviation = 1; T scores (T); Mean = 50; Standard Deviation = 10  Intellectual/Premorbid Functioning Estimate   Norm Score Percentile  Range  WASI-II FSIQ-2  SS = 123 94 %ile Above Average   Vocabulary   t = 67 95 %ile Above Average   Matrix Reasoning   t = 59 81 %ile High Average   ATTENTION AND WORKING MEMORY    Norm Score Percentile  Range   WAIS-IV          Digit Span  ss = 10 50 %ile Average   DSF  ss = 10 50 %ile Average   Span:  7      DSB  ss = 10 50 %ile Average   Span:    4      DSS  ss = 10 50 %ile Average   Span:    6      COMPLEX ATTENTION    Norm Score Percentile  Descriptor  CPT-3          Response Style          Detectability (reverse scored)  t = 59 80 %ile    Omission Errors  t = 58 88 %ile    Commission Errors  t = 53 68 %ile    Perseverations  t = 46 34 %ile    HRT (reaction time)  t = 58 85 %ile    HRT SD (reaction time consistency) t = 63 92 %ile    Variability (in RT consistency)  t = 61 85 %ile    HRT Block Change (over test)  t = 45 28 %ile    HRT ISI Change (by stimuli interval) t = 52 66 %ile    PROCESSING SPEED    Norm Score Percentile  Range  WAIS-IV          Coding  ss = 6 9 %ile Low Average   Symbol Search  ss = 9 37 %ile Average   LANGUAGE    Norm Score Percentile  Range  Boston Naming Test (BNT-2)  t = 49 45 %ile Average  COWAT          FAS  t = 30 2 %ile Below Average   Animals  t = 41 18 %ile Low Average   EXECUTIVE FUNCTIONING    Norm Score Percentile  Range  DKEFS - Color-Word Interference          Color Naming  ss = 5 5 %ile Below Average   Word Reading  ss = 7 16 %ile Low Average   Inhibition  ss = 3 1 %ile Exceptionally Low   Errors  ss = 5 5 %ile Below Average   Inhibition Switching  ss = 4 2 %ile Below Average   Errors  ss = 11 63 %ile Average  Trails A  t = 40 16 %ile Low Average  Trails B  t = 46 32 %ile Average   MEMORY    Norm Score Percentile  Range  BVMT-R          Trial 1  t = 58.0 79 %ile High Average   Trial 2  t = 51 53 %ile Average   Trial 3  t = 55.0 68 %ile Average   Total Recall  t = 55.0 68 %ile Average   Learning  t = 46.0 32 %ile Average   Delayed Recall  t = 57.0 75 %ile High Average   % Retained    100 >16 %ile WNL   Hits     >16 %ile WNL   False Alarms     >16 %ile WNL   Recognition Discriminability     >16 %ile WNL  CVLT-III           Trial 1  ss = 8.0 25 %ile Average   Trial 2  ss = 9.0 37 %ile Average   Trial 3  ss = 10.0 50 %ile Average   Trial 4  ss = 8.0 25 %ile Average   Trial 5  ss = 11.0 63 %ile Average   Trial B  ss = 9.0 37 %  ile Average   Short Delay Free Recall  ss = 9.0 37 %ile Average   Short Delay Cued Recall  ss = 10.0 50 %ile Average   Long Delay Free Recall  ss = 8.0 25 %ile Average   Long Delay Cued Recall  ss = 9.0 37 %ile Average   Total Hits  ss = 7.0 16 %ile Low Average   Total False Positives  ss = 10.0 50 %ile Average   Recognition Discriminability  ss = 8.0 25 %ile Average   Total Intrusions  ss = 11.0 63 %ile Average   Trials 1-5 Total Correct  SS = 95 37 %ile Average   Total Repetitions  ss = 13.0 84 %ile High Average   List B vs. Trial 1  ss = 10.0 50 %ile Average   SD (FR) vs. Trial 5 Correct  ss = 8.0 25 %ile Average   LD (FR)vs. SD (FR)  ss = 9.0 37 %ile Average  Wechsler Memory Scale, 4th Edition (WMS-4)         Log. Mem. Immediate Recall  ss = 6 9 %ile Low Average   Logical Memory Delayed Recall  ss = 7 16 %ile Low Average   Logical Recognition    26th-50th  %ile Average   VISUAL-SPATIAL    Norm Score Percentile  Range  WASI-II          Matrix Reasoning  t = 59 81 %ile High Average  Benton JOLO  ss = 13 84 %ile High Average  Rey Complex Figure Copy       <=1 %ile Exceptionally Low   Clock          PERSONALITY AND BEHAVIORAL FUNCTIONING      Score/Interpretation  BDI Raw       17  BDI Severity       Mild.  BAI Raw       21  BAI Severity       Moderate.    Feedback to Patient: Nygeria Lager Shen will return on 11/17/2023 for an interactive feedback session with Dr. Hayden at which time her test performances, clinical impressions and treatment recommendations will be reviewed in detail. The patient understands she can contact our office should she require our assistance before this time.  175 minutes spent face-to-face with patient administering standardized tests, 65 minutes  spent scoring radiographer, therapeutic). [CPT A8018220, 96139]  Full report to follow.

## 2023-11-13 ENCOUNTER — Ambulatory Visit: Admitting: Psychology

## 2023-11-16 ENCOUNTER — Encounter: Admitting: Psychology

## 2023-11-16 DIAGNOSIS — F900 Attention-deficit hyperactivity disorder, predominantly inattentive type: Secondary | ICD-10-CM

## 2023-11-16 DIAGNOSIS — F330 Major depressive disorder, recurrent, mild: Secondary | ICD-10-CM | POA: Diagnosis not present

## 2023-11-16 DIAGNOSIS — F411 Generalized anxiety disorder: Secondary | ICD-10-CM | POA: Diagnosis not present

## 2023-11-16 DIAGNOSIS — R41840 Attention and concentration deficit: Secondary | ICD-10-CM | POA: Diagnosis not present

## 2023-11-17 ENCOUNTER — Encounter: Admitting: Psychology

## 2023-11-17 DIAGNOSIS — F411 Generalized anxiety disorder: Secondary | ICD-10-CM

## 2023-11-17 DIAGNOSIS — F330 Major depressive disorder, recurrent, mild: Secondary | ICD-10-CM

## 2023-11-17 DIAGNOSIS — F900 Attention-deficit hyperactivity disorder, predominantly inattentive type: Secondary | ICD-10-CM | POA: Diagnosis not present

## 2023-12-03 NOTE — Progress Notes (Signed)
 Kristin [name]                                          MRN: [id number]   12/03/2023   The [facility] Quality Team Specialist reviewed this patient medical record for the purposes of chart review for care gap closure. The following were reviewed: chart review for care gap closure-kidney health evaluation for diabetes:eGFR  and uACR.    [facility] Quality Team

## 2023-12-08 NOTE — Telephone Encounter (Signed)
 open in error

## 2023-12-14 ENCOUNTER — Other Ambulatory Visit: Payer: Self-pay | Admitting: Family

## 2023-12-14 DIAGNOSIS — G4709 Other insomnia: Secondary | ICD-10-CM

## 2023-12-14 MED ORDER — ZOLPIDEM TARTRATE ER 6.25 MG PO TBCR: 12.5000 mg | EXTENDED_RELEASE_TABLET | Freq: Every evening | ORAL | 0 refills | Status: DC | PRN

## 2023-12-14 NOTE — Telephone Encounter (Signed)
 DOC coverage:  1. Other insomnia - PDMP reviewed, last refill for Zolpidem  6.25 mg ER 60 tablets for 1 months was done on 11/14/23.  - zolpidem  (AMBIEN  CR) 6.25 MG CR tablet; Take 2 tablets (12.5 mg total) by mouth at bedtime as needed for sleep.  Dispense: 60 tablet; Refill: 0  Luke Shade, MD

## 2023-12-14 NOTE — Telephone Encounter (Unsigned)
 Copied from CRM #[id number]. Topic: Clinical - Medication Refill >> Dec 14, 2023 12:33 PM Mercedes MATSU wrote: Medication:  zolpidem  (AMBIEN  CR) 6.25 MG CR tablet  Has the patient contacted their pharmacy? Yes (Agent: If no, request that the patient contact the pharmacy for the refill. If patient does not wish to contact the pharmacy document the reason why and proceed with request.) (Agent: If yes, when and what did the pharmacy advise?)  This is the patient's preferred pharmacy:  [id number] [place] [place] - [address] [address] Phone: [phone] Fax: [phone]   Is this the correct pharmacy for this prescription? Yes If no, delete pharmacy and type the correct one.   Has the prescription been filled recently? Yes  Is the patient out of the medication? Yes  Has the patient been seen for an appointment in the last year OR does the patient have an upcoming appointment? Yes  Can we respond through MyChart? No, patient requesting call back if refilled is denied at [phone]  Agent: Please be advised that Rx refills may take up to 3 business days. We ask that you follow-up with your pharmacy.

## 2023-12-14 NOTE — Telephone Encounter (Signed)
 Copied from CRM #[id number]. Topic: Clinical - Medication Refill >> Dec 14, 2023 12:36 PM Mercedes MATSU wrote: Medication:  zolpidem  (AMBIEN  CR) 6.25 MG CR tablet   Has the patient contacted their pharmacy? Yes (Agent: If no, request that the patient contact the pharmacy for the refill. If patient does not wish to contact the pharmacy document the reason why and proceed with request.) (Agent: If yes, when and what did the pharmacy advise?)  This is the patient's preferred pharmacy:  [id number] [place], [place] - [address] [place] [place] [address] Phone: [phone] Fax: [phone]   Is this the correct pharmacy for this prescription? Yes If no, delete pharmacy and type the correct one.   Has the prescription been filled recently? Yes  Is the patient out of the medication? Yes  Has the patient been seen for an appointment in the last year OR does the patient have an upcoming appointment? Yes  Can we respond through MyChart? Yes  Agent: Please be advised that Rx refills may take up to 3 business days. We ask that you follow-up with your pharmacy.

## 2023-12-15 NOTE — Telephone Encounter (Signed)
 Refilled by Dr. Abbey 12/14/2023 for #60 with no refills.

## 2024-01-04 ENCOUNTER — Ambulatory Visit: Admitting: Dermatology

## 2024-01-04 DIAGNOSIS — L578 Other skin changes due to chronic exposure to nonionizing radiation: Secondary | ICD-10-CM | POA: Diagnosis not present

## 2024-01-04 DIAGNOSIS — L570 Actinic keratosis: Secondary | ICD-10-CM | POA: Diagnosis not present

## 2024-01-04 DIAGNOSIS — L821 Other seborrheic keratosis: Secondary | ICD-10-CM | POA: Diagnosis not present

## 2024-01-04 DIAGNOSIS — Z85828 Personal history of other malignant neoplasm of skin: Secondary | ICD-10-CM | POA: Diagnosis not present

## 2024-01-04 DIAGNOSIS — L814 Other melanin hyperpigmentation: Secondary | ICD-10-CM | POA: Diagnosis not present

## 2024-01-04 DIAGNOSIS — Z7189 Other specified counseling: Secondary | ICD-10-CM

## 2024-01-04 DIAGNOSIS — W908XXA Exposure to other nonionizing radiation, initial encounter: Secondary | ICD-10-CM | POA: Diagnosis not present

## 2024-01-04 NOTE — Patient Instructions (Addendum)
 Photodynamic Therapy- Blue or Red Light Therapy  Actinic keratoses are the dry, red scaly spots on the skin caused by sun damage. A portion of these spots can turn into skin cancer with time, and treating them can help prevent development of skin cancer.   Treatment of these spots requires removal of the defective skin cells. There are various ways to remove actinic keratoses, including freezing with liquid nitrogen, treatment with creams, or treatment with a blue light procedure in the office.   Photodynamic Therapy (PDT), also known as blue or red light therapy is an in office procedure used to treat actinic keratoses. It works by targeting precancerous cells. After treatment, these cells peel off and are replaced by healthy ones.   For your phototherapy appointment, you will have two appointments on the day of your treatment. The first appointment will be to apply a cream to the treatment area. You will leave this cream on for 1-2 hours depending on the area being treated. The second appointment will be to shine a blue or red light on the area for 16-20 minutes to kill off the precancer cells. It is common to experience a burning sensation during the treatment.  After your treatment, it will be important to keep the treated areas of skin out of the sun completely for 48-72 hours (2-3 days) to prevent having a reaction.   Common side effects include: - Burning or stinging, which may be severe and can last up to 24-72 hours after your treatment - Scaling and crusting which may last up to 2 weeks - Redness, swelling and/or peeling which can last up to 4 weeks  To Care for Your Skin After PDT/Blue/Red Light Therapy: - Wash with soap, water and shampoo as normal. - If needed, you can use cold compresses (e.g. ice packs) for comfort - If okay with your primary care doctor, you may use analgesics such as acetaminophen (tylenol) every 4-6 hours, not to exceed recommended dose - You may apply  Cerave Healing Ointment, Vaseline or Aquaphor as needed - If you have a lot of swelling you may take a Benadryl to help with this (this may cause drowsiness), not to exceed recommended dose. This may increase the risk of falls in people over 65 and may slow reaction time while driving, so it is not recommended to take before driving or operating machinery. - Sun Precautions - Wear a wide brim hat for the next week if outside  - Wear a sunblock with zinc or titanium dioxide at least SPF 50 daily  If you have any questions or concerns, please call the office and ask to speak with a nurse.   --------------------------------------------------------------------------------------------------------------    Actinic keratoses are precancerous spots that appear secondary to cumulative UV radiation exposure/sun exposure over time. They are chronic with expected duration over 1 year. A portion of actinic keratoses will progress to squamous cell carcinoma of the skin. It is not possible to reliably predict which spots will progress to skin cancer and so treatment is recommended to prevent development of skin cancer.  Recommend daily broad spectrum sunscreen SPF 30+ to sun-exposed areas, reapply every 2 hours as needed.  Recommend staying in the shade or wearing long sleeves, sun glasses (UVA+UVB protection) and wide brim hats (4-inch brim around the entire circumference of the hat). Call for new or changing lesions.   Cryotherapy Aftercare  Wash gently with soap and water everyday.   Apply Vaseline and Band-Aid daily until healed.  Due to recent changes in healthcare laws, you may see results of your pathology and/or laboratory studies on MyChart before the doctors have had a chance to review them. We understand that in some cases there may be results that are confusing or concerning to you. Please understand that not all results are received at the same time and often the doctors may need to interpret  multiple results in order to provide you with the best plan of care or course of treatment. Therefore, we ask that you please give us  2 business days to thoroughly review all your results before contacting the office for clarification. Should we see a critical lab result, you will be contacted sooner.   If You Need Anything After Your Visit  If you have any questions or concerns for your doctor, please call our main line at [phone] and press option 4 to reach your doctor's medical assistant. If no one answers, please leave a voicemail as directed and we will return your call as soon as possible. Messages left after 4 pm will be answered the following business day.   You may also send us  a message via MyChart. We typically respond to MyChart messages within 1-2 business days.  For prescription refills, please ask your pharmacy to contact our office. Our fax number is [phone].  If you have an urgent issue when the clinic is closed that cannot wait until the next business day, you can page your doctor at the number below.    Please note that while we do our best to be available for urgent issues outside of office hours, we are not available 24/7.   If you have an urgent issue and are unable to reach us , you may choose to seek medical care at your doctor's office, retail clinic, urgent care center, or emergency room.  If you have a medical emergency, please immediately call 911 or go to the emergency department.  Pager Numbers  - Dr. Hester: [phone]  - Dr. Jackquline: [phone]  - Dr. Claudene: [phone]   - Dr. Raymund: [phone]  In the event of inclement weather, please call our main line at [phone] for an update on the status of any delays or closures.  Dermatology Medication Tips: Please keep the boxes that topical medications come in in order to help keep track of the instructions about where and how to use these. Pharmacies typically print the medication  instructions only on the boxes and not directly on the medication tubes.   If your medication is too expensive, please contact our office at [phone] option 4 or send us  a message through MyChart.   We are unable to tell what your co-pay for medications will be in advance as this is different depending on your insurance coverage. However, we may be able to find a substitute medication at lower cost or fill out paperwork to get insurance to cover a needed medication.   If a prior authorization is required to get your medication covered by your insurance company, please allow us  1-2 business days to complete this process.  Drug prices often vary depending on where the prescription is filled and some pharmacies may offer cheaper prices.  The website www.goodrx.com contains coupons for medications through different pharmacies. The prices here do not account for what the cost may be with help from insurance (it may be cheaper with your insurance), but the website can give you the price if you did not use any insurance.  - You can print  the associated coupon and take it with your prescription to the pharmacy.  - You may also stop by our office during regular business hours and pick up a GoodRx coupon card.  - If you need your prescription sent electronically to a different pharmacy, notify our office through [facility] or by phone at [phone] option 4.     Si Usted Necesita Algo Despus de Su Visita  Tambin puede enviarnos un mensaje a travs de Clinical cytogeneticist. Por lo general respondemos a los mensajes de MyChart en el transcurso de 1 a 2 das hbiles.  Para renovar recetas, por favor pida a su farmacia que se ponga en contacto con nuestra oficina. Randi lakes de fax es [place] [phone].  Si tiene un asunto urgente cuando la clnica est cerrada y que no puede esperar hasta el siguiente da hbil, puede llamar/localizar a su doctor(a) al nmero que aparece a continuacin.   Por  favor, tenga en cuenta que aunque hacemos todo lo posible para estar disponibles para asuntos urgentes fuera del horario de [place], no estamos disponibles las 24 horas del da, los 7 [address]  [address] de la [place].   Si tiene un problema urgente y no puede comunicarse con nosotros, puede optar por buscar atencin mdica  en el consultorio de su doctor(a), en una clnica privada, en un centro de atencin urgente o en una sala de emergencias.  Si tiene Engineer, drilling, por favor llame inmediatamente al 911 o vaya a la sala de emergencias.  Nmeros de bper  - Dr. Hester: [phone]  - Dra. Jackquline: [phone]  - Dr. Claudene: [phone]  - Dra. Kitts: [phone]  En caso de inclemencias del [place], por favor llame a nuestra lnea principal al [phone] para una actualizacin sobre el estado de cualquier retraso o cierre.  Consejos para la medicacin en dermatologa: Por favor, guarde las cajas en las que vienen los medicamentos de uso tpico para ayudarle a seguir las instrucciones sobre dnde y cmo usarlos. Las farmacias generalmente imprimen las instrucciones del medicamento slo en las cajas y no directamente en los tubos del [place].   Si su medicamento es muy caro, por favor, pngase en contacto con landry rieger llamando al [phone] y presione la opcin 4 o envenos un mensaje a travs de Clinical cytogeneticist.   No podemos decirle cul ser su copago por los medicamentos por adelantado ya que esto es diferente dependiendo de la cobertura de su seguro. Sin embargo, es posible que podamos encontrar un medicamento sustituto a Audiological scientist un formulario para que el seguro cubra el medicamento que se considera necesario.   Si se requiere una autorizacin previa para que su compaa de seguros malta su medicamento, por favor permtanos de 1 a 2 das hbiles para completar este proceso.  Los precios de los medicamentos varan con frecuencia dependiendo del Environmental consultant de dnde se surte la  receta y alguna farmacias pueden ofrecer precios ms baratos.  El sitio web www.goodrx.com tiene cupones para medicamentos de Health and safety inspector. Los precios aqu no tienen en cuenta lo que podra costar con la ayuda del seguro (puede ser ms barato con su seguro), pero el sitio web puede darle el precio si no utiliz Tourist information centre manager.  - Puede imprimir el cupn correspondiente y llevarlo con su receta a la farmacia.  - Tambin puede pasar por nuestra oficina durante el horario de atencin regular y Education officer, museum una tarjeta de cupones de GoodRx.  - Si necesita que su receta se enve electrnicamente a ignacia bender  diferente, informe a nuestra oficina a travs de MyChart de [place] o por telfono llamando al [phone] y presione la opcin 4.

## 2024-01-04 NOTE — Progress Notes (Signed)
 Follow-Up Visit   Subjective  Kristin [name] is a 63 y.o. female who presents for the following:  Recheck bcc at left pretibia treated with ED&C, recheck today Scaly spot above right eyebrow doesn't clear up Scaly spot on right hand 5th digit doesn't clear up   The following portions of the chart were reviewed this encounter and updated as appropriate: medications, allergies, medical history  Review of Systems:  No other skin or systemic complaints except as noted in HPI or Assessment and Plan.  Objective  Well appearing patient in no apparent distress; mood and affect are within normal limits.  A focused examination was performed of the following areas: Face, right hand, left leg   Relevant exam findings are noted in the Assessment and Plan.  right 5th finger at base x 1, right mid eyebrow x 1 (2) Erythematous thin papules/macules with gritty scale.   Assessment & Plan   HISTORY OF BASAL CELL CARCINOMA OF THE SKIN 07/06/2023 - Left pretibia treated with ED&C  - No evidence of recurrence today - Recommend regular full body skin exams - Recommend daily broad spectrum sunscreen SPF 30+ to sun-exposed areas, reapply every 2 hours as needed.  - Call if any new or changing lesions are noted between office visits  SEBORRHEIC KERATOSIS - Stuck-on, waxy, tan-brown papules and/or plaques  - Benign-appearing - Discussed benign etiology and prognosis. - Observe - Call for any changes  LENTIGINES Exam: scattered tan macules Due to sun exposure Treatment Plan: Benign-appearing, observe. Recommend daily broad spectrum sunscreen SPF 30+ to sun-exposed areas, reapply every 2 hours as needed.  Call for any changes   ACTINIC DAMAGE WITH PRECANCEROUS ACTINIC KERATOSES Counseling for Topical Chemotherapy Management: Patient exhibits: - Severe, confluent actinic changes with pre-cancerous actinic keratoses that is secondary to cumulative UV radiation exposure over time - Condition  that is severe; chronic, not at goal. - diffuse scaly erythematous macules and papules with underlying dyspigmentation - Discussed Prescription Field Treatment topical Chemotherapy for Severe, Chronic Confluent Actinic Changes with Pre-Cancerous Actinic Keratoses Field treatment involves treatment of an entire area of skin that has confluent Actinic Changes (Sun/ Ultraviolet light damage) and PreCancerous Actinic Keratoses by method of PhotoDynamic Therapy (PDT) and/or prescription Topical Chemotherapy agents such as 5-fluorouracil, 5-fluorouracil/calcipotriene, and/or imiquimod.  The purpose is to decrease the number of clinically evident and subclinical PreCancerous lesions to prevent progression to development of skin cancer by chemically destroying early precancer changes that may or may not be visible.  It has been shown to reduce the risk of developing skin cancer in the treated area. As a result of treatment, redness, scaling, crusting, and open sores may occur during treatment course. One or more than one of these methods may be used and may have to be used several times to control, suppress and eliminate the PreCancerous changes. Discussed treatment course, expected reaction, and possible side effects. - Recommend daily broad spectrum sunscreen SPF 30+ to sun-exposed areas, reapply every 2 hours as needed.  - Staying in the shade or wearing long sleeves, sun glasses (UVA+UVB protection) and wide brim hats (4-inch brim around the entire circumference of the hat) are also recommended. - Call for new or changing lesions. - pt will schedule red light photodynamic therapy to the face with debridement   ACTINIC KERATOSIS (2) right 5th finger at base x 1, right mid eyebrow x 1 (2) Actinic keratoses are precancerous spots that appear secondary to cumulative UV radiation exposure/sun exposure over time. They are  chronic with expected duration over 1 year. A portion of actinic keratoses will progress to  squamous cell carcinoma of the skin. It is not possible to reliably predict which spots will progress to skin cancer and so treatment is recommended to prevent development of skin cancer.  Recommend daily broad spectrum sunscreen SPF 30+ to sun-exposed areas, reapply every 2 hours as needed.  Recommend staying in the shade or wearing long sleeves, sun glasses (UVA+UVB protection) and wide brim hats (4-inch brim around the entire circumference of the hat). Call for new or changing lesions. - Destruction of lesion - right 5th finger at base x 1, right mid eyebrow x 1 (2)  Destruction method: cryotherapy   Informed consent: discussed and consent obtained   Lesion destroyed using liquid nitrogen: Yes   Region frozen until ice ball extended beyond lesion: Yes   Outcome: patient tolerated procedure well with no complications   Post-procedure details: wound care instructions given   Additional details:  Prior to procedure, discussed risks of blister formation, small wound, skin dyspigmentation, or rare scar following cryotherapy. Recommend Vaseline ointment to treated areas while healing.    Return for keep followup scheduled in June 2026 for tbse.  I, Eleanor Blush, CMA, am acting as scribe for Rexene Rattler, MD.   Documentation: I have reviewed the above documentation for accuracy and completeness, and I agree with the above.  Rexene Rattler, MD

## 2024-01-19 ENCOUNTER — Ambulatory Visit: Admitting: Dermatology

## 2024-01-19 ENCOUNTER — Encounter

## 2024-01-19 DIAGNOSIS — L570 Actinic keratosis: Secondary | ICD-10-CM | POA: Diagnosis not present

## 2024-01-19 MED ORDER — AMINOLEVULINIC ACID HCL 10 % EX GEL
2000.0000 mg | Freq: Once | CUTANEOUS | Status: AC
  Administered 2024-01-19: 2000 mg via TOPICAL

## 2024-01-19 NOTE — Patient Instructions (Signed)

## 2024-01-19 NOTE — Progress Notes (Unsigned)
 Patient completed red light phototherapy with debridement today.  ACTINIC KERATOSES Exam: Erythematous thin papules/macules with gritty scale.  Treatment Plan:  Red Light Photodynamic therapy  Procedure discussed: discussed risks, benefits, side effects. and alternatives   Prep: site scrubbed/prepped with acetone   Debridement needed: Yes (performed by Physician with sand paper.  (CPT [id number])) Location:  face Number of lesions:  Multiple (> 15) Type of treatment:  Red light Aminolevulinic Acid (see MAR for details): Ameluz  Aminolevulinic Acid comment:  [id number] Amount of Ameluz  (mg):  1 Incubation time (minutes):  60 Number of minutes under lamp:  20 Cooling:  Fan Outcome: patient tolerated procedure well with no complications   Post-procedure details: sunscreen applied and aftercare instructions given to patient    Related Medications Aminolevulinic Acid HCl 10 % GEL 2,000 mg   I personally debrided area prior to application of aminolevulinic acid   Documentation: I have reviewed the above documentation for accuracy and completeness, and I agree with the above.  ASC-NURSE ROOM

## 2024-01-27 ENCOUNTER — Other Ambulatory Visit: Payer: Self-pay | Admitting: Family

## 2024-01-27 DIAGNOSIS — G4709 Other insomnia: Secondary | ICD-10-CM

## 2024-01-27 NOTE — Telephone Encounter (Signed)
 Copied from CRM #[id number]. Topic: Clinical - Medication Refill >> Jan 27, 2024  3:53 PM Ivette P wrote: Medication: zolpidem  (AMBIEN  CR) 6.25 MG CR tablet  Has the patient contacted their pharmacy? Yes (Agent: If no, request that the patient contact the pharmacy for the refill. If patient does not wish to contact the pharmacy document the reason why and proceed with request.) (Agent: If yes, when and what did the pharmacy advise?)  This is the patient's preferred pharmacy:  [id number] [place], [place] - [address] [place] [address] Phone: [phone] Fax: [phone]    Is this the correct pharmacy for this prescription? Yes If no, delete pharmacy and type the correct one.   Has the prescription been filled recently? No  Is the patient out of the medication? Yes, enough for 1 day   Has the patient been seen for an appointment in the last year OR does the patient have an upcoming appointment? Yes  Can we respond through MyChart? Yes  Agent: Please be advised that Rx refills may take up to 3 business days. We ask that you follow-up with your pharmacy.

## 2024-01-29 MED ORDER — ZOLPIDEM TARTRATE ER 6.25 MG PO TBCR: 12.5000 mg | EXTENDED_RELEASE_TABLET | Freq: Every evening | ORAL | 0 refills | Status: AC | PRN

## 2024-02-07 NOTE — Progress Notes (Signed)
" ° °  NEUROPSYCHOLOGICAL EVALUATION [place]. [facility]  Physical Medicine and Rehabilitation     Patient: Kristin [name]  MRN: [id number] DOB: 1960-05-19   Service Provider/Clinical Neuropsychologist: Evalene DOROTHA Riff, PsyD  Date of Service: 11/17/23 Start Time: 1 PM End Time: 2 PM  Location of Service:  [facility] Physical Medicine & Rehabilitation Department [place]. [facility] [address], [place] [place], [place] [address] Phone: [phone]   Billing Code/Service: [phone]    Individuals present: Patient, Provider Laurier DOROTHA Riff, PsyD)  Provider conducted the 60-minute interactive feedback appointment in-person with the patient.  The provider reviewed and discussed the results of neuropsychological evaluation. Follow-up interviewing was conducted as needed to refine interpretation of findings as needed. Review of results included overall findings, diagnosis, and treatment planning/recommendations that were derived from integration of patient data, interpretation of standardized rest results and clinical data, and clinical decision making, which are documented in the patient's electronic medical record with the full report (date listed below). A copy of the full report will also be mailed to the patient.   The patient expressed understanding of the information reviewed. The patient was provided opportunity to ask questions which were then answered by the provider. The provider worked collaboratively to tailor treatment recommendations to the patient when possible. The patient was informed they could reach out to the provider should additional questions related to the evaluation arise.    The final neuropsychological evaluation report, documented in the patient's chart DATE 11/16/23, was amended to reflect any additional information obtained during the feedback appointment including treatment planning collaboration.    Evalene DOROTHA Riff, PsyD [facility] [address] [place], [place] [address] Main: [phone] Fax: [phone] [facility] License # [id number]  This report was generated using voice recognition software. While this document has been carefully reviewed, transcription errors may be present. I apologize in advance for any inconvenience. Please contact me if further clarification is needed.  "

## 2024-02-07 NOTE — Progress Notes (Signed)
 "  NEUROPSYCHOLOGICAL EVALUATION [place]  Physical Medicine and Rehabilitation     Patient: Kristin [name]  MRN: [id number] DOB: [birth year]  Age: 64 y.o. Sex: female  Race/Ethnicity: White or Caucasian  Years of Education: 13 Handedness: Right-handed  Referring Provider: Eappen, Saramma, MD Provider/Clinical Neuropsychologist: Evalene DOROTHA Riff, PsyD  Date of Service: 11/16/2023 Start Time: 11 AM End Time: 12 PM Location of Service:  [facility] Physical Medicine & Rehabilitation Department [place] [address], [place], [place] [address] Billing Code/Service: [phone]  Individuals Present: Evalene Riff, PsyD 1 hour was spent on interpretation of patient data, interpretation of standardized test results and clinical data, clinical decision making, initial treatment planning/recommendations, and report writing. The report will be amended as needed based on any additional information collected during interactive feedback session.   REASON FOR REFERRAL: The patient is a 64 year old woman referred for neuropsychological evaluation by her psychiatrist, Dr. Coby, on 07/29/2023.  Per records from referring provider, the patient previously had ADHD testing at [facility] and findings did not show definitive diagnosis of ADHD.  Records stated the patient was advised to have a reevaluation done once mood symptoms are better controlled.  At the time of the 07/29/2023 visit, the patient's anxiety and depression and sleep are reportedly well-managed and the patient was interested in referral for reevaluation.  Per records, the patient has diagnosis of generalized anxiety disorder, bereavement, and recurrent major depressive disorder (in remission).  Medical history includes insomnia, diabetes mellitus, polyneuropathy, tendinopathy of right biceps tendon status post right shoulder arthropathy with repair of subscapularis and  supraspinatus, subacromial decompression with extensive debridement of shoulder and distal clavicle resection, chronic migraine per records.  Medical records from the patient's 2024 evaluation with [facility] was available for review.  ASRS self-report and collateral report (provided by son) endorsements generated total scores of 51 and 50, respectively, and specific item endorsements align with ADHD symptomology. Questionnaires from that evaluation noted significant difficulty with attention, memory, anxiety, and sleep.  Moderate difficulties were noted with panic and mood and milder difficulties with impulsivity, depression, fatigue, and pain.  The cognitive performance measure utilized in that evaluation stated objective testing with Qb.... Results showed statistically significant abnormalities in activity.  Attention parameters were only abnormal out of compensation; very slow and accurate.  A formal diagnosis of ADHD was not provided although symptom criteria suggested ADHD combined type.  The authors of the report indicated suspicion that symptomology was secondary to other factors.  They note difficulties with OCD and social anxiety.  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  They indicated that once anxiety was better manage would be appropriate to undergo reevaluation.  Upon interview, the patient indicated that she is feeling much better relative to a year ago, noting significant improvements in her anxiety and mood.  She indicated that she continues to experience difficulties with ADHD-like symptomology and is interested in undergoing the evaluation.  HISTORY OF PRESENTING CONCERNS:  Based on patient report within diagnostic clinical interview;  ADHD Symptomology: DIVA-5 (Diagnostic Interview for ADHD in Adults) is utilized in conjunction with broader in-depth diagnostic clinical interview to support differential  diagnosis.  -Symptoms indicated as currently present (adult): Present at least 6 months but also appear chronic/trait-like. Symptoms negatively impact functioning. Symptoms are not better explained by another mental disorder. -Symptoms indicated as present during childhood: Present by 64 years of age, are inconsistent with developmental  level, negatively impact functioning, and are not better explained by another mental disorder.  Attention-Deficit Symptoms: Often difficulties with.. Current Childhood  A1a Attention to detail/careless mistakes:  Yes Yes  A1b Sustained attention:  Yes Yes  A1c Attending when addressed directly:  Yes Yes  A1d Follow through on instructions / tasks:   Yes Yes  A1e Organizational difficulty:  No Yes  A77f Aversion / avoidance of sustained mental effort:  Yes Yes  A1g Losing things necessary for tasks/activities:  Yes DK  A1h Easily distracted by extraneous stimuli:  Yes Yes  A1i Forgetful in daily activities:  Yes Yes      Hyperactivity/Impulsivity Symptoms: Often difficulties with    A2a Fidgeting/squirming in seat: Yes Yes  A2b Remaining seated / needing to move around: No Yes  A2c Restlessness/runs or climbs when inappropriate:  Yes Yes  A2d Engaging in leisure activities quietly: ** Yes Yes  A2e Excessive energy:  No Yes  A23f Talking excessively:  No No  A2g Blurting out answers / interrupting others:  No Yes  A2h Waiting turn:  No Yes  A2i Interruption or socially intrusive: No Yes   Possible secondary factors impacting current Sx: Subclinical depressive symptoms. Anxiety (albeit improved relative to prior year). Possible indications of poor sleep quality. OCD-like tendencies. Medication side effects. Frequent headache.  Complicating factors regarding childhood Sx: Possible greater than average premorbid cognitive abilities, which can buffer functional impact. Test anxiety.  Functional Impact: Negative impact on self-esteem due to history of social  interactions and parental messaging. Difficulties with efficient task completion.    Motor/Sensory Complaints:   Sensory changes: Hearing is intact. Vision is declining slowly, likely due to age related changes.   Emotional and Behavioral Functioning:  The patient reported that her mental health has been improved relative to July. Depression Sx (Current): Denied frequent feelings of low mood/sadness, anhedonia, changes in appetite, changes in sleep not accounted for by other factors, feelings of hopelessness, excessive guilt. Denied suicidal ideation.  Anxiety Sx (Current): Endorsed some continued difficulty with generalized worry/anxiety, although significant reduced relative to last year. Some mild difficulties with relaxing and feeling tense. Panic attack frequency is substantially reduced. No panic attacks within the last six months.  Other: Denied any active PTSD symptomology. No current or past homicidal ideation, hallucination, delusions/paranoia, mania, or in-patient psychiatric stays.  Sleep: Sleeps about 8-9 hours on average per night. Minor difficulties falling asleep, but is actively working on reducing reliance on Ambien . Reported that her husband hasn't noticed her having apneic episodes while asleep. Denies waking up with headaches. Had difficulty wearing CPAP mask. Weight loss reportedly improved Sx.  Appetite: No unintentional weight loss or gain.  Caffeine: None  Alcohol Use: Minimal Tobacco Use: None Recreational Substance Use: None   Academic/Vocational History: Graduated high school. Typically earned Bs. No specific learning disorder. Historically a quick reader. Retired in 2019. 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 She indicated it was challenging for her to sit for long periods of times. There were multiple tasks/jobs she cross-trained in but was limited in progression without a bachelors degree. She remained in the position primarily for financial  reasons.   Psychosocial: Marital Status: Married 43 years.  Children/Grandchildren: 3 sons.  Living Situation: Lives alone with her husband.   Level of Functional Independence: The patient is intact with basic and instrumental activities of daily living.   Medical History/Record Review: Per records and patient report History of traumatic brain injury/concussion: None  History of stroke: None   History of heart attack: None   History of cancer/chemotherapy:None   History of seizure activity: None   Symptoms of chronic pain: mild (2/10 in severity)   Experience of frequent headaches/migraines: Migraine headaches approximately once per month.     Past Medical History:  Diagnosis Date   ADHD    Adhesive capsulitis of right shoulder 07/23/2020   Allergy     Annual physical exam 08/19/2019   Anxiety 12/12/2015   Arthritis    Basal cell carcinoma    nose, left cheek, right upper lip, right posterior shoulder most recent 6 years ago on left cheek   Basal cell carcinoma 03/26/2021   L chest, EDC 04/24/2021   Basal cell carcinoma 07/06/2023   L pretibial, EDC   Benign essential hypertension 08/14/2014   Bleeding    Bursitis of right shoulder 12/25/2020   Chicken pox    Chronic diarrhea    Chronic intractable headache 10/20/2017   Chronic migraine w/o aura, not intractable, w/o stat migr 08/08/2020   Complication of anesthesia    blood pressure elevated after gastric sleeve surgery in recovery, pt does not want mask over face; migraines   COVID-19    03/01/20 had MAB 03/03/20 and felt better next day 03/04/20   CTS (carpal tunnel syndrome) 04/05/2020   Dr. Maree s/p TMC/Lidocaine  injection 12/05/19   Depression    Diabetes mellitus without complication (HCC)    pre-diabetic currently, was diabetic prior to gastric sleeve surgery    Diabetic polyneuropathy associated with diabetes mellitus due to underlying condition (HCC) 08/08/2020   Disorder of right rotator cuff    10/03/19 MRI  right shoulder abnormal Dr. Lorriane Blanch   Fall    08/2020   Fall    03/28/21   Fatty liver    Fatty liver 05/06/2017   US  + 2013    Gout    Hematuria 10/15/2018   History of shingles 06/19/2021   lower back and R side of thigh   Hives 03/28/2020   Hyperlipidemia    Hypertension    no problems since losing weight after gastric sleeve surgery   Hypertension 07/17/2014   IBS (irritable bowel syndrome)    Injury of right wrist 06/26/2016   Insomnia    Migraine    Neuropathy    bilateral feet   Obesity    Obesity (BMI 30-39.9) 05/06/2017   Obesity, diabetes, and hypertension syndrome (HCC) 08/18/2019   Osteopenia    Palpitations 08/14/2014   PONV (postoperative nausea and vomiting)    Squamous cell carcinoma of skin 03/26/2021   SCC IS R mid sternum, EDC 04/24/2021   Tendonitis of shoulder, right 12/25/2020   Patient Active Problem List   Diagnosis Date Noted   Attention and concentration deficit 07/29/2023   Wound of skin 06/30/2023   Allergic rhinitis 06/30/2023   Encounter for immunization 06/30/2023   Night sweats 03/20/2023   Back pain 01/04/2023   Urinary frequency 01/04/2023   Orthostatic hypotension 11/02/2022   Loss of weight 11/02/2022   Poor appetite 11/02/2022   Dysuria 11/02/2022   Chest discomfort 11/02/2022   Dizzinesses 11/02/2022   Need for influenza vaccination 11/02/2022   COVID-19 10/06/2022   Bereavement 09/23/2022   GAD (generalized anxiety disorder) 09/23/2022   Diabetic eye exam (HCC) 07/12/2022   Vitamin B 12 deficiency 07/12/2022   Mood disorder 05/21/2022   MDD (major depressive disorder), recurrent episode, mild 05/21/2022   Attention deficit hyperactivity disorder (ADHD), combined type 05/21/2022  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/2022   Dysphagia 04/10/2022   High risk medication use 04/03/2022   Ingrown toenail without infection 02/16/2022   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   Depression 01/27/2022   Hair loss 10/02/2021   Stress 03/28/2020    Hypertension associated with diabetes (HCC) 08/18/2019   Irritable bowel syndrome 03/01/2019   Type 2 diabetes mellitus with complications (HCC) 08/31/2018   Vitamin D  deficiency 08/31/2018   Chronic migraine without aura, with intractable migraine, so stated, with status migrainosus 11/10/2017   Hyperlipidemia associated with type 2 diabetes mellitus (HCC) 12/12/2015   Obstructive sleep apnea syndrome 10/10/2014   Gastroesophageal reflux disease with hiatal hernia 09/29/2014   Insomnia 07/17/2014   Imaging/Lab Results:  06/10/23 MRI of the brain with and without contrast. Per records; IMPRESSION: Normal MRI scan of the brain with and without contrast.  Family Neurologic/Medical Hx:  Family History  Problem Relation Age of Onset   Heart disease Mother    Breast cancer Mother 70       metastatic, now hip, spine, and leg    Arthritis Mother    Hyperlipidemia Mother    Cancer Mother        spinal surgery. breast dx'ed age 51 now metastatic 63 as of 07/13/18   Cystic fibrosis Mother        carrier    Heart disease Father    Lung cancer Father        smoker   Hyperlipidemia Father    Hypertension Father    Depression Sister    Diabetes Sister    Stroke Sister    Other Sister        covid +   Diabetes Sister    Diabetes Maternal Aunt    Alcohol abuse Maternal Aunt    Diabetes Maternal Aunt    Alcohol abuse Maternal Uncle    Diabetes Maternal Uncle    Alcohol abuse Paternal Uncle    Diabetes Maternal Grandfather    Hyperlipidemia Maternal Grandfather    Arthritis Maternal Grandmother    Sudden death Cousin    Schizophrenia Cousin    Cystic fibrosis Other        great niece    Factor V Leiden deficiency Other        uncle and cousins and m GF (all maternal)   Alcohol abuse Niece    Alcohol abuse Nephew    ADD / ADHD Son    Depression Son    ADD / ADHD Son    Post-traumatic stress disorder Son    Colon cancer Neg Hx    Ovarian cancer Neg Hx    Medications:   buPROPion  (WELLBUTRIN ) 75 MG tablet celecoxib (CELEBREX) 200 MG capsule cetirizine (ZYRTEC) 10 MG chewable tablet citalopram  (CELEXA ) 40 MG tablet fexofenadine  (ALLEGRA ) 180 MG tablet fluconazole  (DIFLUCAN ) 150 MG tablet gabapentin  (NEURONTIN ) 300 MG capsule - regularly hydrOXYzine  (ATARAX ) 25 MG tablet - as needed. Rarely taken.  montelukast  (SINGULAIR ) 10 MG tablet omeprazole  (PRILOSEC) 40 MG capsule ondansetron  (ZOFRAN -ODT) 4 MG disintegrating tablet Rimegepant Sulfate (NURTEC) 75 MG TBDP Roflumilast  (ZORYVE ) 0.3 % CREA rosuvastatin  (CRESTOR ) 20 MG tablet solifenacin  (VESICARE ) 5 MG tablet  tirzepatide  (MOUNJARO ) 2.5 MG/0.5ML Pen topiramate  (TOPAMAX ) 50 MG tablet    zolpidem  (AMBIEN  CR) 6.25 MG CR tablet  NEUROPSYCHOLOGICAL TEST RESULTS  Mental Status/Behavioral Observations (11/11/2023):  Orientation: The patient was oriented to self and place. She was oriented to time with the exception of the exact day of the month (off by one day). Sensory/Arousal: Hearing and  vision were adequate for testing. The patient was alert. Appearance: Dress and hygiene were appropriate for the setting.   Speech/Language: In conversation, the patient's speech was prosodic, fluent, and well-articulated. The patient displayed no indications of word finding difficulties and no word substitution errors were observed.  Motor: The patient ambulated independently and without issue. No tremors were observed.   Social Comportment: Social behavior was appropriate to the setting. Mood/Affect: Mood was largely neutral to positive. Affect was consistent with mood.  Attention/Concentration:  The patient appeared to maintain consistent engagement throughout testing. No frank attentional lapses were observed.  Thought Process/Content: The patient's thought process was coherent, linear, goal directed. There were no indications of psychosis.  Additional Observations: The patient showed no difficulties with  understanding task instructions. No difficulties with frustration tolerance were noted.  Tests Administered: Automatic Data Edition (BNT-2) Brief Visuospatial Memory Test-Revised (BVMT-R) Benton Judgment of Line Orientation (JOLO) [place]  Verbal Learning Test-Third Edition (CVLT-3) Clock Drawing Test Conners Continuous Performance Test 3rd Edition (CPT-3) Controlled Oral Word Association Test (FAS & Animals) Delis-Kaplan Executive Function System (D-KEFS), select subtests Rey Complex Figure Test (RCFT), select subtests Trail Making Test (TMT; Part A & B) Wechsler Adult Intelligence Scale-Fourth Edition (WAIS-IV), select subtests Wechsler Abbreviated Scale of Intelligence CIVIL SERVICE FAST STREAMER) Wechsler Memory Scale-Fourth Edition (WMS-IV) , select subtests The Beck Depression Inventory-II (BDI-II) Beck Anxiety Inventory (BAI)   Results: Note: This summary of test scores accompanies the interpretive report and should not be interpreted by unqualified individuals or in isolation without reference to the report. Test scores are relative to age, gender, and educational history as available and appropriate. Measurement properties of test scores: IQ, Index, and Standard Scores (SS): Mean = 100; Standard Deviation = 15; Scaled Scores (ss): Mean = 10; Standard Deviation = 3; Z scores (Z): Mean = 0; Standard Deviation = 1; T scores (T); Mean = 50; Standard Deviation = 10  Performance Validity: The patient's scores on embedded performance validity metrics are within normal limits and, combined with behavioral observations, support the interpretation of test data as a reliable and valid estimate of the patient's cognitive abilities.  Intellectual/Premorbid Functioning Estimate   Norm Score Percentile  Range  WASI-II FSIQ-2  SS = 123 94 %ile Above Average   Vocabulary   t = 67 95 %ile Above Average   Matrix Reasoning   t = 59 81 %ile High Average  Premorbid cognitive abilities are estimated to be  within the above average range based on an abbreviated measure of intellectual functioning.  She scored within the above average range on the measure of crystallized word knowledge (vocabulary) and in the high average range on a measure of fluid nonverbal reasoning.  ATTENTION AND WORKING MEMORY    Norm Score Percentile  Range  WAIS-IV Raw Score         Digit Span 26 ss = 10 50 %ile Average   DSF 10 ss = 10 50 %ile Average   Span:    7      DSB 8 ss = 10 50 %ile Average   Span:    4      DSS 10 ss = 10 50 %ile Average   Span:    6     Basic attention and working memory on an auditory-verbal measure was scored in the average range consistently.  COMPLEX ATTENTION    Norm Score Percentile  Descriptor  CPT-3          Response Style  Balanced   Detectability (reverse scored)  t = 59 80 %ile High average   Omission Errors  t = 58 88 %ile High average   Commission Errors  t = 53 68 %ile Average   Perseverations  t = 46 34 %ile Average   HRT (reaction time)  t = 58 85 %ile Slightly slow   HRT SD (reaction time consistency) t = 63 92 %ile Elevated   Variability (in RT consistency)  t = 61 85 %ile Elevated   HRT Block Change (over test)  t = 45 28 %ile Average   HRT ISI Change (by stimuli interval) t = 52 66 %ile Average  On a continuous performance test assessing multiple aspects of attention and for indications of impulsivity the patient showed some indications of difficulty with basic attention and inconsistency and accuracy across the test showing some difficulties with sustained attention.  PROCESSING SPEED    Norm Score Percentile  Range  WAIS-IV          Coding 39 ss = 6 9 %ile Low Average   Symbol Search 26 ss = 9 37 %ile Average  Scores on measures of processing speed showed low average to average range performances.  Her low performance was on a measure involving rapid digit symbol translation.   LANGUAGE    Norm Score Percentile  Range  Boston Naming Test (BNT-2) 56 t = 49 45  %ile Average  COWAT          FAS 20 t = 30 2 %ile Below Average   Animals 16 t = 41 18 %ile Low Average  The patient's performance on measures of language showed intact confrontation naming/word retrieval.  Scores on verbal fluency test showed below average phonemic verbal fluency and low average semantic verbal fluency.  The patient described notable anxiety during this test, which could potentially have degraded her performance.  EXECUTIVE FUNCTIONING    Norm Score Percentile  Range  DKEFS - Color-Word Interference          Color Naming 44 ss = 5 5 %ile Below Average   Word Reading 30 ss = 7 16 %ile Low Average   Inhibition 109 ss = 3 1 %ile Exceptionally Low   Errors 6 ss = 5 5 %ile Below Average   Inhibition Switching 116 ss = 4 2 %ile Below Average   Errors 2 ss = 11 63 %ile Average  Trails A 40 t = 40 16 %ile Low Average  Trails B 88 (1 error) t = 46 32 %ile Average  The patient's baseline performances involving rapid color naming and rapid word reading were below average and low average respectively, by age.  On the basic response inhibition task her speed was in the exceptionally low score range but only low average relative to her baseline (color naming).  Her accuracy was in the below average range by age.  Her performance on the combination inhibition/set shifting was below average by age but average relative to her baseline inhibition speed.  She scored average in areas for the combination task.  Her speed based performance on a speeded sequencing task was low average by age and education.  She scored within the average range when sequencing was combined with set shifting and had only one error.  MEMORY    Norm Score Percentile  Range  BVMT-R          Trial 1 7 t = 58.0 79 %ile High Average   Trial 2 8  t = 51 53 %ile Average   Trial 3 10 t = 55.0 68 %ile Average   Total Recall  t = 55.0 68 %ile Average   Learning  t = 46.0 32 %ile Average   Delayed Recall 10 t = 57.0 75 %ile  High Average   % Retained    100 >16 %ile WNL   Hits 6    >16 %ile WNL   False Alarms 0    >16 %ile WNL   Recognition Discriminability 6    >16 %ile WNL  CVLT-III          Trial 1 4 ss = 8.0 25 %ile Average   Trial 2 7 ss = 9.0 37 %ile Average   Trial 3 9 ss = 10.0 50 %ile Average   Trial 4 9 ss = 8.0 25 %ile Average   Trial 5 12 ss = 11.0 63 %ile Average   Trial B 4 ss = 9.0 37 %ile Average   Short Delay Free Recall 8 ss = 9.0 37 %ile Average   Short Delay Cued Recall 11 ss = 10.0 50 %ile Average   Long Delay Free Recall 8 ss = 8.0 25 %ile Average   Long Delay Cued Recall 10 ss = 9.0 37 %ile Average   Total Hits 13 ss = 7.0 16 %ile Low Average   Total False Positives 3 ss = 10.0 50 %ile Average   Recognition Discriminability  ss = 8.0 25 %ile Average   Total Intrusions 2 ss = 11.0 63 %ile Average   Trials 1-5 Total Correct  SS = 95 37 %ile Average   Total Repetitions 1 ss = 13.0 84 %ile High Average   List B vs. Trial 1  ss = 10.0 50 %ile Average   SD (FR) vs. Trial 5 Correct  ss = 8.0 25 %ile Average   LD (FR)vs. SD (FR)  ss = 9.0 37 %ile Average  Wechsler Memory Scale, 4th Edition (WMS-4)         Log. Mem. Immediate Recall 17 ss = 6 9 %ile Low Average   Logical Memory Delayed Recall 13 ss = 7 16 %ile Low Average   Logical Recognition 23   26th-50th  %ile Average  The patient completed one visual memory test and two auditory-verbal memory tests.  Her overall immediate recall performance on the visual memory test across the three learning trials was average as was her learning score (gains from repetition).  Following a delay she recalled a high average amount of information for free recall, scoring within normal limits with 100% retention, and she made no errors and scored within normal limits in all metrics of the delayed recognition trial.  On the auditory-verbal memory test involving a word list she scored average individually and across the five learning trials for immediate  recall.  She had no difficulties with immediate recall of an interference list.  She scored average for both free and cued recall after both long and short delays.  Her recognition performance was average for overall discriminability.  While technically low average for true positives these were consistent with her initial encoding.  Her performance was slightly weaker on the prose memory test involving two short stories such that she scored low average for immediate recall and low average for delayed free recall.  She scored average for the delayed recognition task.  VISUAL-SPATIAL    Norm Score Percentile  Range  Benton JOLO 25 ss = 13 84 %ile  High Average  Rey Complex Figure Copy 28      <=1 %ile Exceptionally Low   Clock       WNL  On visual spatial tasks the patient scored within the high average range on a basic perception task involving judgment of Line orientation.  Her score on a figure copy task involving a complex geometric design was in the exceptionally low score range.  Examination of her drawing showed difficulties primarily with initial planning on approach.  She had no difficulties the clock drawing test.   PERSONALITY AND BEHAVIORAL FUNCTIONING      Score/Interpretation  BDI Raw       17  BDI Severity       Mild.  BAI Raw       21  BAI Severity       Moderate.  Self-report endorsements on rating scales of depression and anxiety showed mild depressive symptoms and moderate symptoms of anxiety.   SUMMARY / CLINICAL IMPRESSIONS The patient is a 64 year old woman referred for neuropsychological evaluation by her psychiatrist, Dr. Coby, on 07/29/2023.  Per records from referring provider, the patient previously had ADHD testing at [facility] and findings did not show definitive diagnosis of ADHD.  Records stated the patient was advised to have a reevaluation done once mood symptoms are better controlled.  At the time of the 07/29/2023 visit, the patient's anxiety and  depression and sleep are reportedly well-managed and the patient was interested in referral for reevaluation.  Per records, the patient has diagnosis of generalized anxiety disorder, bereavement, and recurrent major depressive disorder (in remission).  Medical history includes insomnia, diabetes mellitus, polyneuropathy, tendinopathy of right biceps tendon status post right shoulder arthropathy with repair of subscapularis and supraspinatus, subacromial decompression with extensive debridement of shoulder and distal clavicle resection, chronic migraine per records.  Medical records from the patient's 2024 evaluation with [facility] was available for review.  ASRS self-report and collateral report (provided by son) endorsements generated total scores of 51 and 50, respectively, and specific item endorsements align with ADHD symptomology. Questionnaires from that evaluation noted significant difficulty with attention, memory, anxiety, and sleep.  Moderate difficulties were noted with panic and mood and milder difficulties with impulsivity, depression, fatigue, and pain.  The cognitive performance measure utilized in that evaluation stated objective testing with Qb.... Results showed statistically significant abnormalities in activity.  Attention parameters were only abnormal out of compensation; very slow and accurate.  A formal diagnosis of ADHD was not provided although symptom criteria suggested ADHD combined type.  The authors of the report indicated suspicion that symptomology was secondary to other factors.  They note difficulties with OCD and social anxiety.  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  They indicated that once anxiety was better manage would be appropriate to undergo reevaluation.  Upon interview, the patient indicated that she is feeling much better relative to a year ago, noting significant improvements in her  anxiety and mood.  She indicated that she continues to experience difficulties with ADHD-like symptomology and is interested in undergoing the evaluation. Formal neuropsychological evaluation is indicated based on indications of ADHD related symptomatology but also comorbid psychiatric conditions. Given the patient's age the cognitive evaluation will utilize a broad based approach to help contextualize the data and also rule out possible unidentified etiology more common in late life.   Information from the diagnostic clinical interview show evidence suggestive of ADHD related symptomology. Specific symptom endorsements  and information obtained through follow-up interviewing show predominantly inattentive based symptomology which meet domain requirements. The patient's cognitive test data showed no indications concerning for neurodegenerative etiology, consistent with unremarkable findings on brain imaging, reducing concerns for ADHD related symptoms being secondary to another/unidentified neurocognitive disorder. The patient's symptoms appear to be relatively stable over time based on self reports. Persistence of symptoms despite improvements in mental health also suggests these are trait-like areas of difficulty. Cognitive test results show fairly isolated difficulties in complex attention and executive functioning that can be accounted for by ADHD. 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  Medication treatment options for management of ADHD may be beneficial given the diagnosis. Discussion of such options between the patient and referring provider are appropriate.  Medication decisions are ultimately deferred to the prescribing physician.  Monitor for any indications of worsening anxiety due to medication side effects.  Managing mental health, sleep, and chronic health conditions is encouraged given the potential impact on cognitive functioning.  Cognitive tips and strategies may be beneficial to the patient for managing the impact of ADHD related symptoms on daily functioning. Hand-outs are provided in the copy of the report mailed to the patient.   Please feel free to reach out if you have any questions or concerns,    Evalene DOROTHA Riff, PsyD [facility] [address], [address] [place], [place] [address] Main: [phone] Fax: [phone] [place] License # [id number]  This report was generated using voice recognition software. While this document has been carefully reviewed, transcription errors may be present. I apologize in advance for any inconvenience. Please contact me if further clarification is needed.   "

## 2024-02-25 ENCOUNTER — Encounter: Admitting: Family

## 2024-02-29 ENCOUNTER — Ambulatory Visit: Admitting: Psychiatry

## 2024-03-02 ENCOUNTER — Ambulatory Visit: Admitting: Psychiatry

## 2024-03-16 ENCOUNTER — Encounter: Admitting: Family

## 2024-07-18 ENCOUNTER — Encounter: Admitting: Dermatology

## 2024-10-31 ENCOUNTER — Ambulatory Visit: Admitting: Family Medicine
